# Patient Record
Sex: Male | Born: 1945 | Race: White | Hispanic: No | Marital: Married | Smoking: Never smoker
Health system: Southern US, Community
[De-identification: ages and names within clinical notes are randomized; demographics above are authoritative.]

## PROBLEM LIST (undated history)

## (undated) DIAGNOSIS — E119 Type 2 diabetes mellitus without complications: Secondary | ICD-10-CM

## (undated) DIAGNOSIS — Z434 Encounter for attention to other artificial openings of digestive tract: Secondary | ICD-10-CM

## (undated) DIAGNOSIS — E669 Obesity, unspecified: Secondary | ICD-10-CM

## (undated) DIAGNOSIS — I10 Essential (primary) hypertension: Secondary | ICD-10-CM

## (undated) DIAGNOSIS — I219 Acute myocardial infarction, unspecified: Secondary | ICD-10-CM

## (undated) DIAGNOSIS — E785 Hyperlipidemia, unspecified: Secondary | ICD-10-CM

## (undated) DIAGNOSIS — I4891 Unspecified atrial fibrillation: Secondary | ICD-10-CM

## (undated) DIAGNOSIS — I2510 Atherosclerotic heart disease of native coronary artery without angina pectoris: Secondary | ICD-10-CM

## (undated) DIAGNOSIS — T07XXXA Unspecified multiple injuries, initial encounter: Secondary | ICD-10-CM

## (undated) HISTORY — DX: Hyperlipidemia, unspecified: E78.5

## (undated) HISTORY — PX: REVISION TOTAL HIP ARTHROPLASTY: SHX766

## (undated) HISTORY — PX: JOINT REPLACEMENT: SHX530

## (undated) HISTORY — PX: OTHER SURGICAL HISTORY: SHX169

## (undated) HISTORY — PX: CARDIAC CATHETERIZATION: SHX172

## (undated) HISTORY — PX: ANKLE SURGERY: SHX546

---

## 2003-07-21 ENCOUNTER — Inpatient Hospital Stay (HOSPITAL_COMMUNITY): Admission: RE | Admit: 2003-07-21 | Discharge: 2003-07-24 | Payer: Self-pay | Admitting: Orthopedic Surgery

## 2004-02-17 ENCOUNTER — Ambulatory Visit (HOSPITAL_BASED_OUTPATIENT_CLINIC_OR_DEPARTMENT_OTHER): Admission: RE | Admit: 2004-02-17 | Discharge: 2004-02-17 | Payer: Self-pay | Admitting: Orthopedic Surgery

## 2004-02-17 ENCOUNTER — Ambulatory Visit (HOSPITAL_COMMUNITY): Admission: RE | Admit: 2004-02-17 | Discharge: 2004-02-17 | Payer: Self-pay | Admitting: Orthopedic Surgery

## 2005-08-22 ENCOUNTER — Inpatient Hospital Stay (HOSPITAL_COMMUNITY): Admission: AC | Admit: 2005-08-22 | Discharge: 2005-08-24 | Payer: Self-pay

## 2005-11-19 ENCOUNTER — Ambulatory Visit (HOSPITAL_BASED_OUTPATIENT_CLINIC_OR_DEPARTMENT_OTHER): Admission: RE | Admit: 2005-11-19 | Discharge: 2005-11-19 | Payer: Self-pay | Admitting: Orthopedic Surgery

## 2014-10-01 ENCOUNTER — Emergency Department (HOSPITAL_BASED_OUTPATIENT_CLINIC_OR_DEPARTMENT_OTHER): Payer: Medicare Other

## 2014-10-01 ENCOUNTER — Encounter (HOSPITAL_BASED_OUTPATIENT_CLINIC_OR_DEPARTMENT_OTHER): Payer: Self-pay | Admitting: Emergency Medicine

## 2014-10-01 ENCOUNTER — Emergency Department (HOSPITAL_BASED_OUTPATIENT_CLINIC_OR_DEPARTMENT_OTHER)
Admission: EM | Admit: 2014-10-01 | Discharge: 2014-10-01 | Disposition: A | Discharge status: Home / Self Care | Payer: Medicare Other | Attending: Emergency Medicine | Admitting: Emergency Medicine

## 2014-10-01 DIAGNOSIS — Z794 Long term (current) use of insulin: Secondary | ICD-10-CM | POA: Diagnosis not present

## 2014-10-01 DIAGNOSIS — Z79899 Other long term (current) drug therapy: Secondary | ICD-10-CM | POA: Diagnosis not present

## 2014-10-01 DIAGNOSIS — Z7982 Long term (current) use of aspirin: Secondary | ICD-10-CM | POA: Diagnosis not present

## 2014-10-01 DIAGNOSIS — I10 Essential (primary) hypertension: Secondary | ICD-10-CM | POA: Diagnosis not present

## 2014-10-01 DIAGNOSIS — I4891 Unspecified atrial fibrillation: Secondary | ICD-10-CM

## 2014-10-01 DIAGNOSIS — I252 Old myocardial infarction: Secondary | ICD-10-CM | POA: Diagnosis not present

## 2014-10-01 DIAGNOSIS — K2970 Gastritis, unspecified, without bleeding: Secondary | ICD-10-CM | POA: Diagnosis not present

## 2014-10-01 DIAGNOSIS — E119 Type 2 diabetes mellitus without complications: Secondary | ICD-10-CM | POA: Insufficient documentation

## 2014-10-01 DIAGNOSIS — R1012 Left upper quadrant pain: Secondary | ICD-10-CM | POA: Diagnosis present

## 2014-10-01 DIAGNOSIS — R52 Pain, unspecified: Secondary | ICD-10-CM

## 2014-10-01 HISTORY — DX: Type 2 diabetes mellitus without complications: E11.9

## 2014-10-01 HISTORY — DX: Acute myocardial infarction, unspecified: I21.9

## 2014-10-01 HISTORY — DX: Unspecified atrial fibrillation: I48.91

## 2014-10-01 HISTORY — DX: Essential (primary) hypertension: I10

## 2014-10-01 LAB — CBC WITH DIFFERENTIAL/PLATELET
BASOS ABS: 0 10*3/uL (ref 0.0–0.1)
Basophils Relative: 0 % (ref 0–1)
Eosinophils Absolute: 0.1 10*3/uL (ref 0.0–0.7)
Eosinophils Relative: 0 % (ref 0–5)
HEMATOCRIT: 39.9 % (ref 39.0–52.0)
Hemoglobin: 13.3 g/dL (ref 13.0–17.0)
LYMPHS ABS: 1.6 10*3/uL (ref 0.7–4.0)
Lymphocytes Relative: 11 % — ABNORMAL LOW (ref 12–46)
MCH: 30.8 pg (ref 26.0–34.0)
MCHC: 33.3 g/dL (ref 30.0–36.0)
MCV: 92.4 fL (ref 78.0–100.0)
Monocytes Absolute: 1.1 10*3/uL — ABNORMAL HIGH (ref 0.1–1.0)
Monocytes Relative: 7 % (ref 3–12)
Neutro Abs: 12.4 10*3/uL — ABNORMAL HIGH (ref 1.7–7.7)
Neutrophils Relative %: 82 % — ABNORMAL HIGH (ref 43–77)
PLATELETS: 250 10*3/uL (ref 150–400)
RBC: 4.32 MIL/uL (ref 4.22–5.81)
RDW: 15.7 % — AB (ref 11.5–15.5)
WBC: 15.2 10*3/uL — AB (ref 4.0–10.5)

## 2014-10-01 LAB — I-STAT CG4 LACTIC ACID, ED
Lactic Acid, Venous: 2.12 mmol/L (ref 0.5–2.0)
Lactic Acid, Venous: 1.39 mmol/L (ref 0.5–2.0)

## 2014-10-01 LAB — COMPREHENSIVE METABOLIC PANEL
ALK PHOS: 87 U/L (ref 39–117)
ALT: 19 U/L (ref 0–53)
ANION GAP: 14 (ref 5–15)
AST: 28 U/L (ref 0–37)
Albumin: 4.5 g/dL (ref 3.5–5.2)
BUN: 24 mg/dL — AB (ref 6–23)
CALCIUM: 10.3 mg/dL (ref 8.4–10.5)
CHLORIDE: 99 mmol/L (ref 96–112)
CO2: 27 mmol/L (ref 19–32)
Creatinine, Ser: 1.42 mg/dL — ABNORMAL HIGH (ref 0.50–1.35)
GFR, EST AFRICAN AMERICAN: 57 mL/min — AB (ref >90)
GFR, EST NON AFRICAN AMERICAN: 49 mL/min — AB (ref >90)
Glucose, Bld: 240 mg/dL — ABNORMAL HIGH (ref 70–99)
Potassium: 3.8 mmol/L (ref 3.5–5.1)
Sodium: 140 mmol/L (ref 135–145)
TOTAL PROTEIN: 7.7 g/dL (ref 6.0–8.3)
Total Bilirubin: 2.3 mg/dL — ABNORMAL HIGH (ref 0.3–1.2)

## 2014-10-01 LAB — URINALYSIS, ROUTINE W REFLEX MICROSCOPIC
Bilirubin Urine: NEGATIVE
Glucose, UA: 250 mg/dL — AB
HGB URINE DIPSTICK: NEGATIVE
Ketones, ur: 15 mg/dL — AB
Leukocytes, UA: NEGATIVE
Nitrite: NEGATIVE
PROTEIN: NEGATIVE mg/dL
Specific Gravity, Urine: >1.046 — ABNORMAL HIGH (ref 1.005–1.030)
UROBILINOGEN UA: 0.2 mg/dL (ref 0.0–1.0)
pH: 5.5 (ref 5.0–8.0)

## 2014-10-01 LAB — TROPONIN I

## 2014-10-01 LAB — CBG MONITORING, ED: Glucose-Capillary: 195 mg/dL — ABNORMAL HIGH (ref 70–99)

## 2014-10-01 LAB — LIPASE, BLOOD: Lipase: 36 U/L (ref 11–59)

## 2014-10-01 MED ORDER — APIXABAN 5 MG PO TABS: 5.0000 mg | ORAL_TABLET | Freq: Two times a day (BID) | ORAL | Status: DC

## 2014-10-01 MED ORDER — KETOROLAC TROMETHAMINE 30 MG/ML IJ SOLN
INTRAMUSCULAR | Status: AC
  Filled 2014-10-01: qty 1

## 2014-10-01 MED ORDER — FAMOTIDINE IN NACL 20-0.9 MG/50ML-% IV SOLN
20.0000 mg | Freq: Once | INTRAVENOUS | Status: AC
  Administered 2014-10-01: 20 mg via INTRAVENOUS
  Filled 2014-10-01: qty 50

## 2014-10-01 MED ORDER — ONDANSETRON HCL 4 MG/2ML IJ SOLN
4.0000 mg | Freq: Once | INTRAMUSCULAR | Status: AC
  Administered 2014-10-01: 4 mg via INTRAVENOUS

## 2014-10-01 MED ORDER — IOHEXOL 350 MG/ML SOLN
100.0000 mL | Freq: Once | INTRAVENOUS | Status: AC | PRN
  Administered 2014-10-01: 100 mL via INTRAVENOUS

## 2014-10-01 MED ORDER — MORPHINE SULFATE 4 MG/ML IJ SOLN
4.0000 mg | Freq: Once | INTRAMUSCULAR | Status: AC
  Administered 2014-10-01: 4 mg via INTRAVENOUS
  Filled 2014-10-01: qty 1

## 2014-10-01 MED ORDER — FENTANYL CITRATE (PF) 100 MCG/2ML IJ SOLN
100.0000 ug | Freq: Once | INTRAMUSCULAR | Status: AC
  Administered 2014-10-01: 100 ug via INTRAVENOUS

## 2014-10-01 MED ORDER — SUCRALFATE 1 GM/10ML PO SUSP
1.0000 g | Freq: Three times a day (TID) | ORAL | Status: DC
Start: 2014-10-01 — End: 2014-12-16

## 2014-10-01 MED ORDER — SODIUM CHLORIDE 0.9 % IV BOLUS (SEPSIS)
500.0000 mL | Freq: Once | INTRAVENOUS | Status: AC
  Administered 2014-10-01: 500 mL via INTRAVENOUS

## 2014-10-01 MED ORDER — SODIUM CHLORIDE 0.9 % IV SOLN
1000.0000 mL | Freq: Once | INTRAVENOUS | Status: AC
  Administered 2014-10-01: 1000 mL via INTRAVENOUS

## 2014-10-01 MED ORDER — KETOROLAC TROMETHAMINE 30 MG/ML IJ SOLN
30.0000 mg | Freq: Once | INTRAMUSCULAR | Status: AC
  Administered 2014-10-01: 30 mg via INTRAVENOUS

## 2014-10-01 MED ORDER — SODIUM CHLORIDE 0.9 % IV BOLUS (SEPSIS)
1000.0000 mL | Freq: Once | INTRAVENOUS | Status: AC
  Administered 2014-10-01: 1000 mL via INTRAVENOUS

## 2014-10-01 MED ORDER — GI COCKTAIL ~~LOC~~
30.0000 mL | Freq: Once | ORAL | Status: AC
  Administered 2014-10-01: 30 mL via ORAL
  Filled 2014-10-01: qty 30

## 2014-10-01 NOTE — ED Notes (Signed)
Left upper quad pain x 3 hours. Patient is pale and diaphoretic with n/v

## 2014-10-01 NOTE — ED Provider Notes (Signed)
CSN: [id number]     Arrival date & time 10/01/14  0103 History   First MD Initiated Contact with Patient 10/01/14 0123     Chief Complaint  Patient presents with  . Abdominal Pain     (Consider location/radiation/quality/duration/timing/severity/associated sxs/prior Treatment) Patient is a 69 y.o. male presenting with abdominal pain. The history is provided by the patient.  Abdominal Pain Pain location:  LUQ Pain quality: sharp   Pain radiates to:  Does not radiate Pain severity:  Severe Onset quality:  Gradual Duration:  12 hours Timing:  Constant Progression:  Worsening Chronicity:  New Context: not eating and not trauma   Context comment:  New medications Relieved by:  Nothing Worsened by:  Nothing tried Ineffective treatments:  None tried Associated symptoms: nausea and vomiting   Associated symptoms: no chest pain, no constipation, no diarrhea, no fever and no shortness of breath   Risk factors: no alcohol abuse     Past Medical History  Diagnosis Date  . Diabetes mellitus without complication   . MI (myocardial infarction)   . Hypertension    Past Surgical History  Procedure Laterality Date  . Revision total hip arthroplasty    . Knee replacement     History reviewed. No pertinent family history. History  Substance Use Topics  . Smoking status: Never Smoker   . Smokeless tobacco: Not on file  . Alcohol Use: No    Review of Systems  Constitutional: Negative for fever.  Respiratory: Negative for shortness of breath.   Cardiovascular: Negative for chest pain, palpitations and leg swelling.  Gastrointestinal: Positive for nausea, vomiting and abdominal pain. Negative for diarrhea and constipation.  All other systems reviewed and are negative.     Allergies  Review of patient's allergies indicates no known allergies.  Home Medications   Prior to Admission medications   Medication Sig Start Date End Date Taking? Authorizing Provider  allopurinol  (ZYLOPRIM) 300 MG tablet Take 300 mg by mouth daily.   Yes Historical Provider, MD  amLODipine (NORVASC) 10 MG tablet Take 10 mg by mouth daily.   Yes Historical Provider, MD  aspirin 81 MG chewable tablet Chew 81 mg by mouth daily.   Yes Historical Provider, MD  atorvastatin (LIPITOR) 80 MG tablet Take 80 mg by mouth daily.   Yes Historical Provider, MD  furosemide (LASIX) 40 MG tablet Take 40 mg by mouth.   Yes Historical Provider, MD  insulin glargine (LANTUS) 100 UNIT/ML injection Inject 40 Units into the skin at bedtime.   Yes Historical Provider, MD  lisinopril (PRINIVIL,ZESTRIL) 40 MG tablet Take 40 mg by mouth daily.   Yes Historical Provider, MD  metFORMIN (GLUCOPHAGE) 500 MG tablet Take 500 mg by mouth 2 (two) times daily with a meal.   Yes Historical Provider, MD  pantoprazole (PROTONIX) 40 MG tablet Take 40 mg by mouth daily.   Yes Historical Provider, MD   BP 107/46 mmHg  Pulse 80  Temp(Src) 98 F (36.7 C) (Oral)  Resp 22  Ht  (1.702 m)  Wt 266 lb (120.657 kg)  BMI 41.65 kg/m2  SpO2 100% Physical Exam  Constitutional: He is oriented to person, place, and time. He appears well-developed and well-nourished. No distress.  HENT:  Head: Normocephalic and atraumatic.  Mouth/Throat: Oropharynx is clear and moist.  Eyes: Conjunctivae and EOM are normal. Pupils are equal, round, and reactive to light.  Neck: Normal range of motion. Neck supple.  Cardiovascular: Normal rate.  An irregularly irregular rhythm  present.  Pulmonary/Chest: Effort normal and breath sounds normal. No respiratory distress. He has no wheezes. He has no rales.  Abdominal: Soft. Bowel sounds are normal. He exhibits no mass. There is no tenderness. There is no rebound and no guarding.  Musculoskeletal: Normal range of motion.  Neurological: He is alert and oriented to person, place, and time.  Skin: Skin is warm and dry. He is not diaphoretic.  Psychiatric: He has a normal mood and affect.    ED Course   Procedures (including critical care time) Labs Review Labs Reviewed  CBC WITH DIFFERENTIAL/PLATELET - Abnormal; Notable for the following:    WBC 15.2 (*)    RDW 15.7 (*)    Neutrophils Relative % 82 (*)    Neutro Abs 12.4 (*)    Lymphocytes Relative 11 (*)    Monocytes Absolute 1.1 (*)    All other components within normal limits  COMPREHENSIVE METABOLIC PANEL - Abnormal; Notable for the following:    Glucose, Bld 240 (*)    BUN 24 (*)    Creatinine, Ser 1.42 (*)    Total Bilirubin 2.3 (*)    GFR calc non Af Amer 49 (*)    GFR calc Af Amer 57 (*)    All other components within normal limits  URINALYSIS, ROUTINE W REFLEX MICROSCOPIC - Abnormal; Notable for the following:    Specific Gravity, Urine >1.046 (*)    Glucose, UA 250 (*)    Ketones, ur 15 (*)    All other components within normal limits  LIPASE, BLOOD  TROPONIN I  I-STAT CG4 LACTIC ACID, ED  I-STAT CHEM 8, ED  I-STAT CG4 LACTIC ACID, ED    Imaging Review Ct Angio Chest Pe W/cm &/or Wo Cm  10/01/2014   CLINICAL DATA:  Left upper quadrant pain for 3 hours with paleness and diaphoresis.  EXAM: CT ANGIOGRAPHY CHEST, ABDOMEN AND PELVIS  TECHNIQUE: Multidetector CT imaging through the chest, abdomen and pelvis was performed using the standard protocol during bolus administration of intravenous contrast. Multiplanar reconstructed images and MIPs were obtained and reviewed to evaluate the vascular anatomy.  CONTRAST:  OMNIPAQUE IOHEXOL 350 MG/ML SOLN  COMPARISON:  None.  FINDINGS: CTA CHEST FINDINGS  THORACIC INLET/BODY WALL:  No acute abnormality.  MEDIASTINUM:  Normal heart size. No pericardial effusion. There is coronary atherosclerosis which is diffuse throughout the left and right circulation. No aortic aneurysm, dissection, or intramural hematoma. Small sliding hiatal hernia. No evidence of pulmonary embolism.  LUNG WINDOWS:  No consolidation.  No effusion.  No suspicious pulmonary nodule.  OSSEOUS:  See below  Review  of the MIP images confirms the above findings.  CTA ABDOMEN AND PELVIS FINDINGS  BODY WALL: No contributory findings.  Liver: Hepatic steatosis.  No focal abnormality.  Biliary: Cholelithiasis. No evidence of biliary obstruction or inflammation.  Pancreas: Unremarkable.  Spleen: Unremarkable.  Adrenals: Unremarkable.  Kidneys and ureters: 2 punctate calculi in the lower pole left kidney, nonobstructive. No ureteral calculus or hydronephrosis  Bladder: Partly obscured by streak artifact from of the right hip prosthesis. No pathologic findings  Reproductive: Negative for age.  Bowel: Distal colonic diverticulosis. No bowel inflammation or obstruction. Negative appendix.  Retroperitoneum: No mass or adenopathy.  Peritoneum: No ascites or pneumoperitoneum.  Vascular: Standard aortic branching. No aneurysm, dissection, or wall thickening. There is scattered atherosclerotic calcification of the aorta and branch vessels without notable stenosis.  OSSEOUS: Right hip arthroplasty. There is near bridging bulky heterotopic ossification about the joint.  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 This is presumably posttraumatic given unilaterality.  Mid thoracic vertebral body compression deformities appear chronic. There is advanced and diffuse spondylosis. 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  Review of the MIP images confirms the above findings.  IMPRESSION: 1. No acute findings, including aortic dissection. 2. Extensive coronary atherosclerosis. 3. Hepatic steatosis. 4. Cholelithiasis. 5. Additional chronic findings are noted above.   Electronically Signed   By: Marnee Spring M.D.   On: 10/01/2014 03:54   Dg Chest Portable 1 View  10/01/2014   CLINICAL DATA:  Abdominal pain, left upper quadrant  EXAM: PORTABLE CHEST - 1 VIEW  COMPARISON:  None currently available  FINDINGS: Normal heart size and mediastinal contours for technique. No acute infiltrate or edema. No effusion or  pneumothorax. No acute osseous findings.  IMPRESSION: No active disease.   Electronically Signed   By: Marnee Spring M.D.   On: 10/01/2014 03:16   Ct Angio Abd/pel W/ And/or W/o  10/01/2014   CLINICAL DATA:  Left upper quadrant pain for 3 hours with paleness and diaphoresis.  EXAM: CT ANGIOGRAPHY CHEST, ABDOMEN AND PELVIS  TECHNIQUE: Multidetector CT imaging through the chest, abdomen and pelvis was performed using the standard protocol during bolus administration of intravenous contrast. Multiplanar reconstructed images and MIPs were obtained and reviewed to evaluate the vascular anatomy.  CONTRAST:  OMNIPAQUE IOHEXOL 350 MG/ML SOLN  COMPARISON:  None.  FINDINGS: CTA CHEST FINDINGS  THORACIC INLET/BODY WALL:  No acute abnormality.  MEDIASTINUM:  Normal heart size. No pericardial effusion. There is coronary atherosclerosis which is diffuse throughout the left and right circulation. No aortic aneurysm, dissection, or intramural hematoma. Small sliding hiatal hernia. No evidence of pulmonary embolism.  LUNG WINDOWS:  No consolidation.  No effusion.  No suspicious pulmonary nodule.  OSSEOUS:  See below  Review of the MIP images confirms the above findings.  CTA ABDOMEN AND PELVIS FINDINGS  BODY WALL: No contributory findings.  Liver: Hepatic steatosis.  No focal abnormality.  Biliary: Cholelithiasis. No evidence of biliary obstruction or inflammation.  Pancreas: Unremarkable.  Spleen: Unremarkable.  Adrenals: Unremarkable.  Kidneys and ureters: 2 punctate calculi in the lower pole left kidney, nonobstructive. No ureteral calculus or hydronephrosis  Bladder: Partly obscured by streak artifact from of the right hip prosthesis. No pathologic findings  Reproductive: Negative for age.  Bowel: Distal colonic diverticulosis. No bowel inflammation or obstruction. Negative appendix.  Retroperitoneum: No mass or adenopathy.  Peritoneum: No ascites or pneumoperitoneum.  Vascular: Standard aortic branching. No aneurysm,  dissection, or wall thickening. There is scattered atherosclerotic calcification of the aorta and branch vessels without notable stenosis.  OSSEOUS: Right hip arthroplasty. There is near bridging bulky heterotopic ossification about the joint.  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 This is presumably posttraumatic given unilaterality.  Mid thoracic vertebral body compression deformities appear chronic. There is advanced and diffuse spondylosis. 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  Review of the MIP images confirms the above findings.  IMPRESSION: 1. No acute findings, including aortic dissection. 2. Extensive coronary atherosclerosis. 3. Hepatic steatosis. 4. Cholelithiasis. 5. Additional chronic findings are noted above.   Electronically Signed   By: Marnee Spring M.D.   On: 10/01/2014 03:54     EKG Interpretation   Date/Time:  Friday Kru Allman 15 2016 01:12:51 EDT Ventricular Rate:  83 PR Interval:    QRS Duration: 106 QT Interval:  390 QTC Calculation: 458 R Axis:   -  22 Text Interpretation:  Atrial fibrillation Minimal voltage criteria for  LVH, may be normal variant Confirmed by [facility]  MD, Teyton Pattillo ([id number])  on 10/01/2014 2:15:16 AM      MDM   Final diagnoses:  Pain  Gastritis  Atrial fibrillation with controlled ventricular response   Results for orders placed or performed during the hospital encounter of 10/01/14  CBC with Differential  Result Value Ref Range   WBC 15.2 (H) 4.0 - 10.5 K/uL   RBC 4.32 4.22 - 5.81 MIL/uL   Hemoglobin 13.3 13.0 - 17.0 g/dL   HCT 03.4 74.2 - 59.5 %   MCV 92.4 78.0 - 100.0 fL   MCH 30.8 26.0 - 34.0 pg   MCHC 33.3 30.0 - 36.0 g/dL   RDW 63.8 (H) 75.6 - 43.3 %   Platelets 250 150 - 400 K/uL   Neutrophils Relative % 82 (H) 43 - 77 %   Neutro Abs 12.4 (H) 1.7 - 7.7 K/uL   Lymphocytes Relative 11 (L) 12 - 46 %   Lymphs Abs 1.6 0.7 - 4.0 K/uL   Monocytes Relative 7 3 - 12 %   Monocytes Absolute  1.1 (H) 0.1 - 1.0 K/uL   Eosinophils Relative 0 0 - 5 %   Eosinophils Absolute 0.1 0.0 - 0.7 K/uL   Basophils Relative 0 0 - 1 %   Basophils Absolute 0.0 0.0 - 0.1 K/uL  Comprehensive metabolic panel  Result Value Ref Range   Sodium 140 135 - 145 mmol/L   Potassium 3.8 3.5 - 5.1 mmol/L   Chloride 99 96 - 112 mmol/L   CO2 27 19 - 32 mmol/L   Glucose, Bld 240 (H) 70 - 99 mg/dL   BUN 24 (H) 6 - 23 mg/dL   Creatinine, Ser 2.95 (H) 0.50 - 1.35 mg/dL   Calcium 18.8 8.4 - 41.6 mg/dL   Total Protein 7.7 6.0 - 8.3 g/dL   Albumin 4.5 3.5 - 5.2 g/dL   AST 28 0 - 37 U/L   ALT 19 0 - 53 U/L   Alkaline Phosphatase 87 39 - 117 U/L   Total Bilirubin 2.3 (H) 0.3 - 1.2 mg/dL   GFR calc non Af Amer 49 (L) >90 mL/min   GFR calc Af Amer 57 (L) >90 mL/min   Anion gap 14 5 - 15  Lipase, blood  Result Value Ref Range   Lipase 36 11 - 59 U/L  Troponin I  (only if pt is 68 y.o. or older and pain is above umbilicus)  Result Value Ref Range   Troponin I <0.03 <0.031 ng/mL  Urinalysis, Routine w reflex microscopic  Result Value Ref Range   Color, Urine YELLOW YELLOW   APPearance CLEAR CLEAR   Specific Gravity, Urine >1.046 (H) 1.005 - 1.030   pH 5.5 5.0 - 8.0   Glucose, UA 250 (A) NEGATIVE mg/dL   Hgb urine dipstick NEGATIVE NEGATIVE   Bilirubin Urine NEGATIVE NEGATIVE   Ketones, ur 15 (A) NEGATIVE mg/dL   Protein, ur NEGATIVE NEGATIVE mg/dL   Urobilinogen, UA 0.2 0.0 - 1.0 mg/dL   Nitrite NEGATIVE NEGATIVE   Leukocytes, UA NEGATIVE NEGATIVE  I-Stat CG4 Lactic Acid, ED  Result Value Ref Range   Lactic Acid, Venous 1.39 0.5 - 2.0 mmol/L   Ct Angio Chest Pe W/cm &/or Wo Cm  10/01/2014   CLINICAL DATA:  Left upper quadrant pain for 3 hours with paleness and diaphoresis.  EXAM: CT ANGIOGRAPHY CHEST, ABDOMEN AND PELVIS  TECHNIQUE:  Multidetector CT imaging through the chest, abdomen and pelvis was performed using the standard protocol during bolus administration of intravenous contrast. Multiplanar  reconstructed images and MIPs were obtained and reviewed to evaluate the vascular anatomy.  CONTRAST:  OMNIPAQUE IOHEXOL 350 MG/ML SOLN  COMPARISON:  None.  FINDINGS: CTA CHEST FINDINGS  THORACIC INLET/BODY WALL:  No acute abnormality.  MEDIASTINUM:  Normal heart size. No pericardial effusion. There is coronary atherosclerosis which is diffuse throughout the left and right circulation. No aortic aneurysm, dissection, or intramural hematoma. Small sliding hiatal hernia. No evidence of pulmonary embolism.  LUNG WINDOWS:  No consolidation.  No effusion.  No suspicious pulmonary nodule.  OSSEOUS:  See below  Review of the MIP images confirms the above findings.  CTA ABDOMEN AND PELVIS FINDINGS  BODY WALL: No contributory findings.  Liver: Hepatic steatosis.  No focal abnormality.  Biliary: Cholelithiasis. No evidence of biliary obstruction or inflammation.  Pancreas: Unremarkable.  Spleen: Unremarkable.  Adrenals: Unremarkable.  Kidneys and ureters: 2 punctate calculi in the lower pole left kidney, nonobstructive. No ureteral calculus or hydronephrosis  Bladder: Partly obscured by streak artifact from of the right hip prosthesis. No pathologic findings  Reproductive: Negative for age.  Bowel: Distal colonic diverticulosis. No bowel inflammation or obstruction. Negative appendix.  Retroperitoneum: No mass or adenopathy.  Peritoneum: No ascites or pneumoperitoneum.  Vascular: Standard aortic branching. No aneurysm, dissection, or wall thickening. There is scattered atherosclerotic calcification of the aorta and branch vessels without notable stenosis.  OSSEOUS: Right hip arthroplasty. There is near bridging bulky heterotopic ossification about the joint.  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 This is presumably posttraumatic given unilaterality.  Mid thoracic vertebral body compression deformities appear chronic. There is advanced and diffuse spondylosis. Advanced lumbar facet osteoarthritis with L4-5 slip  and foraminal stenosis.  Review of the MIP images confirms the above findings.  IMPRESSION: 1. No acute findings, including aortic dissection. 2. Extensive coronary atherosclerosis. 3. Hepatic steatosis. 4. Cholelithiasis. 5. Additional chronic findings are noted above.   Electronically Signed   By: Marnee Spring M.D.   On: 10/01/2014 03:54   Dg Chest Portable 1 View  10/01/2014   CLINICAL DATA:  Abdominal pain, left upper quadrant  EXAM: PORTABLE CHEST - 1 VIEW  COMPARISON:  None currently available  FINDINGS: Normal heart size and mediastinal contours for technique. No acute infiltrate or edema. No effusion or pneumothorax. No acute osseous findings.  IMPRESSION: No active disease.   Electronically Signed   By: Marnee Spring M.D.   On: 10/01/2014 03:16   Ct Angio Abd/pel W/ And/or W/o  10/01/2014   CLINICAL DATA:  Left upper quadrant pain for 3 hours with paleness and diaphoresis.  EXAM: CT ANGIOGRAPHY CHEST, ABDOMEN AND PELVIS  TECHNIQUE: Multidetector CT imaging through the chest, abdomen and pelvis was performed using the standard protocol during bolus administration of intravenous contrast. Multiplanar reconstructed images and MIPs were obtained and reviewed to evaluate the vascular anatomy.  CONTRAST:  OMNIPAQUE IOHEXOL 350 MG/ML SOLN  COMPARISON:  None.  FINDINGS: CTA CHEST FINDINGS  THORACIC INLET/BODY WALL:  No acute abnormality.  MEDIASTINUM:  Normal heart size. No pericardial effusion. There is coronary atherosclerosis which is diffuse throughout the left and right circulation. No aortic aneurysm, dissection, or intramural hematoma. Small sliding hiatal hernia. No evidence of pulmonary embolism.  LUNG WINDOWS:  No consolidation.  No effusion.  No suspicious pulmonary nodule.  OSSEOUS:  See below  Review of the MIP images confirms the  above findings.  CTA ABDOMEN AND PELVIS FINDINGS  BODY WALL: No contributory findings.  Liver: Hepatic steatosis.  No focal abnormality.  Biliary:  Cholelithiasis. No evidence of biliary obstruction or inflammation.  Pancreas: Unremarkable.  Spleen: Unremarkable.  Adrenals: Unremarkable.  Kidneys and ureters: 2 punctate calculi in the lower pole left kidney, nonobstructive. No ureteral calculus or hydronephrosis  Bladder: Partly obscured by streak artifact from of the right hip prosthesis. No pathologic findings  Reproductive: Negative for age.  Bowel: Distal colonic diverticulosis. No bowel inflammation or obstruction. Negative appendix.  Retroperitoneum: No mass or adenopathy.  Peritoneum: No ascites or pneumoperitoneum.  Vascular: Standard aortic branching. No aneurysm, dissection, or wall thickening. There is scattered atherosclerotic calcification of the aorta and branch vessels without notable stenosis.  OSSEOUS: Right hip arthroplasty. There is near bridging bulky heterotopic ossification about the joint.  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 This is presumably posttraumatic given unilaterality.  Mid thoracic vertebral body compression deformities appear chronic. There is advanced and diffuse spondylosis. 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  Review of the MIP images confirms the above findings.  IMPRESSION: 1. No acute findings, including aortic dissection. 2. Extensive coronary atherosclerosis. 3. Hepatic steatosis. 4. Cholelithiasis. 5. Additional chronic findings are noted above.   Electronically Signed   By: Marnee SpringJonathon  Watts M.D.   On: 10/01/2014 03:54    Medications  0.9 %  sodium chloride infusion (0 mLs Intravenous Stopped 10/01/14 0244)  ondansetron (ZOFRAN) injection 4 mg (4 mg Intravenous Given 10/01/14 0122)  fentaNYL (SUBLIMAZE) injection 100 mcg (100 mcg Intravenous Given 10/01/14 0138)  sodium chloride 0.9 % bolus 500 mL (0 mLs Intravenous Stopped 10/01/14 0433)  iohexol (OMNIPAQUE) 350 MG/ML injection 100 mL (100 mLs Intravenous Contrast Given 10/01/14 0242)  morphine 4 MG/ML injection  4 mg (4 mg Intravenous Given 10/01/14 0257)  sodium chloride 0.9 % bolus 1,000 mL (0 mLs Intravenous Stopped 10/01/14 0546)  famotidine (PEPCID) IVPB 20 mg (0 mg Intravenous Stopped 10/01/14 0547)  ketorolac (TORADOL) 30 MG/ML injection 30 mg (30 mg Intravenous Given 10/01/14 0431)  gi cocktail (Maalox,Lidocaine,Donnatal) (30 mLs Oral Given 10/01/14 0431)    Likely gastritis from 3 new medications started by patient's PMD  Based on ongoing symptoms with > 8 hours duration with negative EKG for ischemia and negative troponin ACS is excluded.  Pain resolved with GI cocktail.  CT negative for acute pathology.  Given Afib discussed case via phone with Dr. Shirlee LatchMcLean of cardiology with start eliquis BID 5 mg for anticoagulation and have patient follow up with his cardiologist.  Call your doctor about the new medications that you are stopping and regarding starting Eliquis.  Contact your cardiologist in am.  Strict return precautions given    Khylen Riolo, MD 10/01/14 [phone]

## 2014-10-05 ENCOUNTER — Encounter (HOSPITAL_BASED_OUTPATIENT_CLINIC_OR_DEPARTMENT_OTHER): Payer: Self-pay

## 2014-10-05 ENCOUNTER — Inpatient Hospital Stay (HOSPITAL_BASED_OUTPATIENT_CLINIC_OR_DEPARTMENT_OTHER)
Admission: EM | Admit: 2014-10-05 | Discharge: 2014-10-11 | DRG: 853 | Disposition: A | Discharge status: Home / Self Care | Payer: Medicare Other | Attending: Internal Medicine | Admitting: Internal Medicine

## 2014-10-05 ENCOUNTER — Emergency Department (HOSPITAL_BASED_OUTPATIENT_CLINIC_OR_DEPARTMENT_OTHER): Payer: Medicare Other

## 2014-10-05 DIAGNOSIS — R6521 Severe sepsis with septic shock: Secondary | ICD-10-CM | POA: Diagnosis present

## 2014-10-05 DIAGNOSIS — I480 Paroxysmal atrial fibrillation: Secondary | ICD-10-CM | POA: Insufficient documentation

## 2014-10-05 DIAGNOSIS — E860 Dehydration: Secondary | ICD-10-CM | POA: Diagnosis present

## 2014-10-05 DIAGNOSIS — I252 Old myocardial infarction: Secondary | ICD-10-CM

## 2014-10-05 DIAGNOSIS — I248 Other forms of acute ischemic heart disease: Secondary | ICD-10-CM | POA: Diagnosis present

## 2014-10-05 DIAGNOSIS — Z7982 Long term (current) use of aspirin: Secondary | ICD-10-CM | POA: Diagnosis not present

## 2014-10-05 DIAGNOSIS — Z6841 Body Mass Index (BMI) 40.0 and over, adult: Secondary | ICD-10-CM | POA: Diagnosis not present

## 2014-10-05 DIAGNOSIS — I9581 Postprocedural hypotension: Secondary | ICD-10-CM | POA: Diagnosis not present

## 2014-10-05 DIAGNOSIS — N141 Nephropathy induced by other drugs, medicaments and biological substances: Secondary | ICD-10-CM | POA: Diagnosis not present

## 2014-10-05 DIAGNOSIS — I2510 Atherosclerotic heart disease of native coronary artery without angina pectoris: Secondary | ICD-10-CM | POA: Diagnosis present

## 2014-10-05 DIAGNOSIS — K8000 Calculus of gallbladder with acute cholecystitis without obstruction: Secondary | ICD-10-CM | POA: Diagnosis present

## 2014-10-05 DIAGNOSIS — E1165 Type 2 diabetes mellitus with hyperglycemia: Secondary | ICD-10-CM | POA: Diagnosis present

## 2014-10-05 DIAGNOSIS — K819 Cholecystitis, unspecified: Secondary | ICD-10-CM | POA: Diagnosis not present

## 2014-10-05 DIAGNOSIS — Z955 Presence of coronary angioplasty implant and graft: Secondary | ICD-10-CM | POA: Diagnosis not present

## 2014-10-05 DIAGNOSIS — I493 Ventricular premature depolarization: Secondary | ICD-10-CM | POA: Diagnosis present

## 2014-10-05 DIAGNOSIS — N179 Acute kidney failure, unspecified: Secondary | ICD-10-CM | POA: Diagnosis not present

## 2014-10-05 DIAGNOSIS — Z96659 Presence of unspecified artificial knee joint: Secondary | ICD-10-CM | POA: Diagnosis present

## 2014-10-05 DIAGNOSIS — G9341 Metabolic encephalopathy: Secondary | ICD-10-CM | POA: Diagnosis not present

## 2014-10-05 DIAGNOSIS — J9601 Acute respiratory failure with hypoxia: Secondary | ICD-10-CM | POA: Diagnosis not present

## 2014-10-05 DIAGNOSIS — Z95828 Presence of other vascular implants and grafts: Secondary | ICD-10-CM

## 2014-10-05 DIAGNOSIS — E119 Type 2 diabetes mellitus without complications: Secondary | ICD-10-CM | POA: Diagnosis not present

## 2014-10-05 DIAGNOSIS — D649 Anemia, unspecified: Secondary | ICD-10-CM | POA: Diagnosis present

## 2014-10-05 DIAGNOSIS — R0600 Dyspnea, unspecified: Secondary | ICD-10-CM

## 2014-10-05 DIAGNOSIS — I10 Essential (primary) hypertension: Secondary | ICD-10-CM | POA: Diagnosis present

## 2014-10-05 DIAGNOSIS — A419 Sepsis, unspecified organism: Principal | ICD-10-CM | POA: Diagnosis present

## 2014-10-05 DIAGNOSIS — I959 Hypotension, unspecified: Secondary | ICD-10-CM

## 2014-10-05 DIAGNOSIS — Z79899 Other long term (current) drug therapy: Secondary | ICD-10-CM | POA: Diagnosis not present

## 2014-10-05 DIAGNOSIS — R1011 Right upper quadrant pain: Secondary | ICD-10-CM | POA: Diagnosis present

## 2014-10-05 DIAGNOSIS — E785 Hyperlipidemia, unspecified: Secondary | ICD-10-CM | POA: Diagnosis present

## 2014-10-05 DIAGNOSIS — T508X5A Adverse effect of diagnostic agents, initial encounter: Secondary | ICD-10-CM | POA: Diagnosis present

## 2014-10-05 DIAGNOSIS — K810 Acute cholecystitis: Secondary | ICD-10-CM | POA: Diagnosis not present

## 2014-10-05 HISTORY — DX: Atherosclerotic heart disease of native coronary artery without angina pectoris: I25.10

## 2014-10-05 HISTORY — DX: Unspecified atrial fibrillation: I48.91

## 2014-10-05 HISTORY — DX: Obesity, unspecified: E66.9

## 2014-10-05 LAB — URINALYSIS, ROUTINE W REFLEX MICROSCOPIC
Bilirubin Urine: NEGATIVE
Glucose, UA: NEGATIVE mg/dL
Hgb urine dipstick: NEGATIVE
KETONES UR: NEGATIVE mg/dL
Leukocytes, UA: NEGATIVE
NITRITE: NEGATIVE
PH: 5 (ref 5.0–8.0)
Protein, ur: NEGATIVE mg/dL
Specific Gravity, Urine: 1.013 (ref 1.005–1.030)
Urobilinogen, UA: 0.2 mg/dL (ref 0.0–1.0)

## 2014-10-05 LAB — CBC WITH DIFFERENTIAL/PLATELET
Basophils Absolute: 0 10*3/uL (ref 0.0–0.1)
Basophils Relative: 0 % (ref 0–1)
EOS PCT: 0 % (ref 0–5)
Eosinophils Absolute: 0 10*3/uL (ref 0.0–0.7)
HCT: 30.3 % — ABNORMAL LOW (ref 39.0–52.0)
HEMOGLOBIN: 10.6 g/dL — AB (ref 13.0–17.0)
LYMPHS ABS: 0.5 10*3/uL — AB (ref 0.7–4.0)
Lymphocytes Relative: 4 % — ABNORMAL LOW (ref 12–46)
MCH: 31.2 pg (ref 26.0–34.0)
MCHC: 35 g/dL (ref 30.0–36.0)
MCV: 89.1 fL (ref 78.0–100.0)
MONOS PCT: 13 % — AB (ref 3–12)
Monocytes Absolute: 1.5 10*3/uL — ABNORMAL HIGH (ref 0.1–1.0)
Neutro Abs: 9.7 10*3/uL — ABNORMAL HIGH (ref 1.7–7.7)
Neutrophils Relative %: 83 % — ABNORMAL HIGH (ref 43–77)
Platelets: 174 10*3/uL (ref 150–400)
RBC: 3.4 MIL/uL — AB (ref 4.22–5.81)
RDW: 15.4 % (ref 11.5–15.5)
WBC: 11.7 10*3/uL — ABNORMAL HIGH (ref 4.0–10.5)

## 2014-10-05 LAB — I-STAT CHEM 8, ED
BUN: 20 mg/dL (ref 6–23)
CHLORIDE: 105 mmol/L (ref 96–112)
Calcium, Ion: 1.09 mmol/L — ABNORMAL LOW (ref 1.13–1.30)
Creatinine, Ser: 1.2 mg/dL (ref 0.50–1.35)
GLUCOSE: 221 mg/dL — AB (ref 70–99)
POTASSIUM: 3.3 mmol/L — AB (ref 3.5–5.1)
Sodium: 142 mmol/L (ref 135–145)
TCO2: 20 mmol/L (ref 0–100)

## 2014-10-05 LAB — COMPREHENSIVE METABOLIC PANEL
ALT: 25 U/L (ref 0–53)
ANION GAP: 10 (ref 5–15)
AST: 34 U/L (ref 0–37)
Albumin: 3 g/dL — ABNORMAL LOW (ref 3.5–5.2)
Alkaline Phosphatase: 76 U/L (ref 39–117)
BUN: 57 mg/dL — AB (ref 6–23)
CO2: 25 mmol/L (ref 19–32)
Calcium: 7.9 mg/dL — ABNORMAL LOW (ref 8.4–10.5)
Chloride: 98 mmol/L (ref 96–112)
Creatinine, Ser: 2.33 mg/dL — ABNORMAL HIGH (ref 0.50–1.35)
GFR calc Af Amer: 31 mL/min — ABNORMAL LOW (ref >90)
GFR calc non Af Amer: 27 mL/min — ABNORMAL LOW (ref >90)
Glucose, Bld: 198 mg/dL — ABNORMAL HIGH (ref 70–99)
POTASSIUM: 3.8 mmol/L (ref 3.5–5.1)
Sodium: 133 mmol/L — ABNORMAL LOW (ref 135–145)
TOTAL PROTEIN: 6.6 g/dL (ref 6.0–8.3)
Total Bilirubin: 2.8 mg/dL — ABNORMAL HIGH (ref 0.3–1.2)

## 2014-10-05 LAB — I-STAT CG4 LACTIC ACID, ED: Lactic Acid, Venous: 1.63 mmol/L (ref 0.5–2.0)

## 2014-10-05 LAB — LIPASE, BLOOD: LIPASE: 25 U/L (ref 11–59)

## 2014-10-05 MED ORDER — SODIUM CHLORIDE 0.9 % IJ SOLN
3.0000 mL | Freq: Two times a day (BID) | INTRAMUSCULAR | Status: DC
  Administered 2014-10-06: 3 mL via INTRAVENOUS

## 2014-10-05 MED ORDER — ACETAMINOPHEN 650 MG RE SUPP: 650.0000 mg | Freq: Four times a day (QID) | RECTAL | Status: DC | PRN

## 2014-10-05 MED ORDER — PIPERACILLIN-TAZOBACTAM 3.375 G IVPB 30 MIN
3.3750 g | Freq: Once | INTRAVENOUS | Status: AC
  Administered 2014-10-05: 3.375 g via INTRAVENOUS
  Filled 2014-10-05 (×2): qty 50

## 2014-10-05 MED ORDER — ONDANSETRON HCL 4 MG/2ML IJ SOLN
4.0000 mg | Freq: Four times a day (QID) | INTRAMUSCULAR | Status: DC | PRN
  Administered 2014-10-07: 4 mg via INTRAVENOUS
  Filled 2014-10-05 (×2): qty 2

## 2014-10-05 MED ORDER — DEXTROSE-NACL 5-0.45 % IV SOLN
INTRAVENOUS | Status: DC
Start: 2014-10-05 — End: 2014-10-05

## 2014-10-05 MED ORDER — SODIUM CHLORIDE 0.9 % IV BOLUS (SEPSIS)
500.0000 mL | Freq: Once | INTRAVENOUS | Status: AC
  Administered 2014-10-05: 500 mL via INTRAVENOUS

## 2014-10-05 MED ORDER — HYDROMORPHONE HCL 1 MG/ML IJ SOLN
0.5000 mg | INTRAMUSCULAR | Status: DC | PRN
  Administered 2014-10-06 (×3): 0.5 mg via INTRAVENOUS
  Filled 2014-10-05 (×3): qty 1

## 2014-10-05 MED ORDER — MORPHINE SULFATE 4 MG/ML IJ SOLN
4.0000 mg | Freq: Once | INTRAMUSCULAR | Status: AC
Start: 2014-10-05 — End: 2014-10-05
  Administered 2014-10-05: 4 mg via INTRAVENOUS
  Filled 2014-10-05: qty 1

## 2014-10-05 MED ORDER — ACETAMINOPHEN 325 MG PO TABS: 650.0000 mg | ORAL_TABLET | Freq: Four times a day (QID) | ORAL | Status: DC | PRN

## 2014-10-05 MED ORDER — PANTOPRAZOLE SODIUM 40 MG PO TBEC
40.0000 mg | DELAYED_RELEASE_TABLET | Freq: Every day | ORAL | Status: DC
  Administered 2014-10-06 – 2014-10-11 (×6): 40 mg via ORAL
  Filled 2014-10-05 (×6): qty 1

## 2014-10-05 MED ORDER — FENTANYL CITRATE (PF) 100 MCG/2ML IJ SOLN
50.0000 ug | Freq: Once | INTRAMUSCULAR | Status: AC
  Administered 2014-10-05: 50 ug via INTRAVENOUS
  Filled 2014-10-05: qty 2

## 2014-10-05 MED ORDER — OXYCODONE HCL 5 MG PO TABS
5.0000 mg | ORAL_TABLET | ORAL | Status: DC | PRN
  Administered 2014-10-06: 5 mg via ORAL
  Filled 2014-10-05: qty 1

## 2014-10-05 MED ORDER — SODIUM CHLORIDE 0.9 % IV SOLN
INTRAVENOUS | Status: DC
  Administered 2014-10-06: 50 mL/h via INTRAVENOUS

## 2014-10-05 MED ORDER — ADULT MULTIVITAMIN W/MINERALS CH
1.0000 | ORAL_TABLET | Freq: Every day | ORAL | Status: DC
  Administered 2014-10-06 – 2014-10-11 (×6): 1 via ORAL
  Filled 2014-10-05 (×6): qty 1

## 2014-10-05 MED ORDER — MORPHINE SULFATE 4 MG/ML IJ SOLN: 4.0000 mg | Freq: Once | INTRAMUSCULAR | Status: DC

## 2014-10-05 MED ORDER — ATORVASTATIN CALCIUM 40 MG PO TABS
80.0000 mg | ORAL_TABLET | Freq: Every day | ORAL | Status: DC
  Filled 2014-10-05: qty 1

## 2014-10-05 MED ORDER — CITALOPRAM HYDROBROMIDE 20 MG PO TABS
40.0000 mg | ORAL_TABLET | Freq: Every day | ORAL | Status: DC
  Administered 2014-10-06: 40 mg via ORAL
  Filled 2014-10-05: qty 1

## 2014-10-05 MED ORDER — ONDANSETRON HCL 4 MG/2ML IJ SOLN
4.0000 mg | Freq: Once | INTRAMUSCULAR | Status: AC
  Administered 2014-10-05: 4 mg via INTRAVENOUS
  Filled 2014-10-05: qty 2

## 2014-10-05 MED ORDER — ALLOPURINOL 300 MG PO TABS
300.0000 mg | ORAL_TABLET | Freq: Every day | ORAL | Status: DC
  Administered 2014-10-06 – 2014-10-11 (×6): 300 mg via ORAL
  Filled 2014-10-05 (×2): qty 1
  Filled 2014-10-05 (×3): qty 3
  Filled 2014-10-05: qty 1

## 2014-10-05 MED ORDER — FOLIC ACID 1 MG PO TABS
1.0000 mg | ORAL_TABLET | Freq: Every day | ORAL | Status: DC
  Administered 2014-10-06 – 2014-10-11 (×6): 1 mg via ORAL
  Filled 2014-10-05 (×5): qty 1

## 2014-10-05 MED ORDER — ONDANSETRON HCL 4 MG PO TABS: 4.0000 mg | ORAL_TABLET | Freq: Four times a day (QID) | ORAL | Status: DC | PRN

## 2014-10-05 MED ORDER — PANTOPRAZOLE SODIUM 40 MG IV SOLR
40.0000 mg | Freq: Once | INTRAVENOUS | Status: AC
  Administered 2014-10-05: 40 mg via INTRAVENOUS
  Filled 2014-10-05: qty 40

## 2014-10-05 MED ORDER — VITAMIN B-1 100 MG PO TABS
100.0000 mg | ORAL_TABLET | Freq: Every day | ORAL | Status: DC
  Administered 2014-10-06 – 2014-10-11 (×6): 100 mg via ORAL
  Filled 2014-10-05 (×6): qty 1

## 2014-10-05 MED ORDER — SUCRALFATE 1 GM/10ML PO SUSP
1.0000 g | Freq: Three times a day (TID) | ORAL | Status: DC
  Filled 2014-10-05 (×5): qty 10

## 2014-10-05 NOTE — H&P (Signed)
[facility] History and Physical  JUNE VACHA ZOX:[id number] DOB: 04/26/1946 DOA: 10/05/2014  Referring physician: Rolan Bucco, MD PCP: Pcp Not In System   Chief Complaint: Abdominal Pain  HPI: Larry [name] is a 69 y.o. male presents with abdominal pain. Patient states that he went to the [place] recently and his medication was changed around. Patient states mostly this was his diabetes medications. He states he started throwing up around Thursday last week. He states that vomiting was intractable. He states that he though his abdominal pain was related to his throwing up. He states that the pain is located on the RUQ. He states that it seems to hurt when he pushes on that side. He has had no diarrhea noted. He states that he has had no cough or congestion. He states he has had no fever that he is awre of. Patient states that he has felt generally weak. In the ED he had an ultrasound done and this shows presence of cholecystitis and also has some gallstones.    Review of Systems:  Constitutional:  No weight loss, night sweats, Fevers, chills, +fatigue.  HEENT:  No headaches, No sneezing, itching, ear ache, nasal congestion, post nasal drip,  Cardio-vascular:  No chest pain, Orthopnea, PND, swelling in lower extremities  GI:  No heartburn, indigestion, ++abdominal pain, ++nausea, ++vomiting, no diarrhea  Resp:  No shortness of breath with exertion or at rest. No coughing up of blood.No change in color of mucus.No wheezing Skin:  no rash or lesions.  GU:  no dysuria, change in color of urine, no urgency or frequency Musculoskeletal:  No joint pain or swelling. No decreased range of motion.  Psych:  No change in mood or affect. No depression or anxiety  Past Medical History  Diagnosis Date  . Diabetes mellitus without complication   . MI (myocardial infarction)   . Hypertension    Past Surgical History  Procedure Laterality Date  . Revision total hip arthroplasty    .  Knee replacement    . Cardiac stent     Social History:  reports that he has never smoked. He does not have any smokeless tobacco history on file. He reports that he does not drink alcohol or use illicit drugs.  No Known Allergies  No family history on file.   Prior to Admission medications   Medication Sig Start Date End Date Taking? Authorizing Provider  Acetaminophen-Codeine (TYLENOL WITH CODEINE #3 PO) Take by mouth.   Yes Historical Provider, MD  Cholecalciferol (VITAMIN D PO) Take by mouth.   Yes Historical Provider, MD  citalopram (CELEXA) 40 MG tablet Take 40 mg by mouth daily.   Yes Historical Provider, MD  Cyanocobalamin (VITAMIN B 12 PO) Take by mouth.   Yes Historical Provider, MD  Liraglutide (VICTOZA [place]) Inject into the skin.   Yes Historical Provider, MD  magnesium oxide (MAG-OX) 400 MG tablet Take 400 mg by mouth daily.   Yes Historical Provider, MD  allopurinol (ZYLOPRIM) 300 MG tablet Take 300 mg by mouth daily.    Historical Provider, MD  amLODipine (NORVASC) 10 MG tablet Take 10 mg by mouth daily.    Historical Provider, MD  apixaban (ELIQUIS) 5 MG TABS tablet Take 1 tablet (5 mg total) by mouth 2 (two) times daily. 10/01/14   April Palumbo, MD  aspirin 81 MG chewable tablet Chew 81 mg by mouth daily.    Historical Provider, MD  atorvastatin (LIPITOR) 80 MG tablet Take 80 mg by mouth  daily.    Historical Provider, MD  furosemide (LASIX) 40 MG tablet Take 40 mg by mouth.    Historical Provider, MD  insulin glargine (LANTUS) 100 UNIT/ML injection Inject 40 Units into the skin at bedtime.    Historical Provider, MD  lisinopril (PRINIVIL,ZESTRIL) 40 MG tablet Take 40 mg by mouth daily.    Historical Provider, MD  metFORMIN (GLUCOPHAGE) 500 MG tablet Take 500 mg by mouth 2 (two) times daily with a meal.    Historical Provider, MD  pantoprazole (PROTONIX) 40 MG tablet Take 40 mg by mouth daily.    Historical Provider, MD  sucralfate (CARAFATE) 1 GM/10ML suspension Take 10 mLs  (1 g total) by mouth 4 (four) times daily -  with meals and at bedtime. 10/01/14   April Palumbo, MD   Physical Exam: Filed Vitals:   10/05/14 2135 10/05/14 2200 10/05/14 2215 10/05/14 2320  BP: 99/61 106/53 110/57 96/48  Pulse: 95 95 91 94  Temp:    98.6 F (37 C)  TempSrc:    Oral  Resp:      Height:      Weight:      SpO2: 95% 92% 97% 98%    Wt Readings from Last 3 Encounters:  10/05/14 117.935 kg (260 lb)  10/01/14 120.657 kg (266 lb)    General:  Appears calm and comfortable Eyes: PERRL, normal lids, irises & conjunctiva ENT: grossly normal hearing, lips & tongue Neck: no LAD, masses or thyromegaly Cardiovascular: RRR, no m/r/g. No LE edema. Respiratory: CTA bilaterally, no w/r/r. Normal respiratory effort. Abdomen: ++RUQ tenderness Skin: no rash or induration seen on limited exam Musculoskeletal: grossly normal tone BUE/BLE Psychiatric: grossly normal mood and affect Neurologic: grossly non-focal.          Labs on Admission:  Basic Metabolic Panel:  Recent Labs Lab 10/01/14 0115 10/01/14 0142 10/05/14 1640  NA 140 142 133*  K 3.8 3.3* 3.8  CL 99 105 98  CO2 27  --  25  GLUCOSE 240* 221* 198*  BUN 24* 20 57*  CREATININE 1.42* 1.20 2.33*  CALCIUM 10.3  --  7.9*   Liver Function Tests:  Recent Labs Lab 10/01/14 0115 10/05/14 1640  AST 28 34  ALT 19 25  ALKPHOS 87 76  BILITOT 2.3* 2.8*  PROT 7.7 6.6  ALBUMIN 4.5 3.0*    Recent Labs Lab 10/01/14 0115 10/05/14 1640  LIPASE 36 25   No results for input(s): AMMONIA in the last 168 hours. CBC:  Recent Labs Lab 10/01/14 0115 10/05/14 1640  WBC 15.2* 11.7*  NEUTROABS 12.4* 9.7*  HGB 13.3 10.6*  HCT 39.9 30.3*  MCV 92.4 89.1  PLT 250 174   Cardiac Enzymes:  Recent Labs Lab 10/01/14 0115  TROPONINI <0.03    BNP (last 3 results) No results for input(s): BNP in the last 8760 hours.  ProBNP (last 3 results) No results for input(s): PROBNP in the last 8760 hours.  CBG:  Recent  Labs Lab 10/01/14 0116  GLUCAP 195*    Radiological Exams on Admission: Ct Abdomen Pelvis Wo Contrast  10/05/2014   CLINICAL DATA:  Upper abdominal pain since last Thursday. Nausea and vomiting.  EXAM: CT ABDOMEN AND PELVIS WITHOUT CONTRAST  TECHNIQUE: Multidetector CT imaging of the abdomen and pelvis was performed following the standard protocol without IV contrast.  COMPARISON:  10/01/2014  FINDINGS: There is bibasilar atelectasis.  There is a punctate nonobstructing left renal calculus. No obstructive uropathy. No perinephric stranding is seen.  The kidneys are symmetric in size without evidence for exophytic mass. The bladder is unremarkable.  The liver demonstrates no focal abnormality. The gallbladder is distended with cholelithiasis, gallbladder wall thickening and pericholecystic inflammatory changes most consistent with acute cholecystitis. The spleen demonstrates no focal abnormality. The adrenal glands and pancreas are normal.  There is a small hiatal hernia. The unopacified stomach, duodenum, small intestine and large intestine are unremarkable, but evaluation is limited by lack of oral contrast. There is no pneumoperitoneum, pneumatosis, or portal venous gas. There is no abdominal or pelvic free fluid. There is no lymphadenopathy.  The abdominal aorta is normal in caliber with atherosclerosis.  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 There is thoracolumbar spine spondylosis most severe at L3-4 and L4-5 with disc space narrowing and bilateral severe facet arthropathy.  IMPRESSION: 1. Cholelithiasis with gallbladder distention, gallbladder wall thickening and pericholecystic inflammatory changes most consistent with acute cholecystitis.   Electronically Signed   By: Elige Ko   On: 10/05/2014 19:08   US Abdomen Complete  10/05/2014   CLINICAL DATA:  Acute onset of right upper quadrant pain 6 days ago. Symptoms are worsening.  EXAM: ULTRASOUND ABDOMEN COMPLETE  COMPARISON:  CT abdomen same  day.  FINDINGS: Gallbladder: Gallstones dependent in the gallbladder. Mild wall thickening. Positive Murphy sign. Small amount of adjacent fluid. Findings all consistent with acute cholecystitis.  Common bile duct: Diameter: 4 mm, normal  Liver: Diffuse fatty change.  No focal lesion seen.  IVC: No abnormality visualized.  Pancreas: Poorly seen because of overlying bowel gas.  Spleen: Poorly seen because of overlying bowel gas.  Right Kidney: Length: 11.0 cm. Echogenicity within normal limits. No mass or hydronephrosis visualized.  Left Kidney: Length: 10.9 cm. Echogenicity within normal limits. No mass or hydronephrosis visualized.  Abdominal aorta: No aneurysm visualized.  Other findings: None.  IMPRESSION: Consistent with acute cholecystitis. Gallstones. Wall thickening. Small amount appear cholecystic fluid. Positive Murphy sign.   Electronically Signed   By: Paulina Fusi M.D.   On: 10/05/2014 19:48      Assessment/Plan Principal Problem:   Cholecystitis Active Problems:   Diabetes mellitus without complication   HTN (hypertension)   Hypotension   Anemia   1. Acute Cholecystitis with gallstones -ultrasound results noted as cholecystitis with gallstones -will start on zosyn -surgery consultation Dr Johna Sheriff is aware of the patient -may need ERCP await surgery and GI input  2. Diabetes Mellitus -will monitor FSBS -start on insulin coverage  3. Hypotension -related to acute infection -will hydrate aggressively -hold antihypertensive medications for now as pressures are soft  4. Anemia -will check iron studies -will monitor labs -will get occult blood stool  Code Status: Full Code (must indicate code status--if unknown or must be presumed, indicate so) DVT Prophylaxis:SCD Family Communication: None (indicate person spoken with, if applicable, with phone number if by telephone) Disposition Plan: Home (indicate anticipated LOS)  Time spent:  [facility] Pager [phone]

## 2014-10-05 NOTE — ED Notes (Signed)
Pts oxygen levels dropped to 82%. 4L o2 applied to the patient.  Pts BP continues to be low

## 2014-10-05 NOTE — ED Notes (Signed)
Patient transported to CT and US. ?

## 2014-10-05 NOTE — ED Provider Notes (Addendum)
CSN: [id number]     Arrival date & time 10/05/14  1600 History   First MD Initiated Contact with Patient 10/05/14 1618     Chief Complaint  Patient presents with  . Abdominal Pain     (Consider location/radiation/quality/duration/timing/severity/associated sxs/prior Treatment) HPI Comments: Patient with a history of hypertension hyperlipidemia and diabetes presents with abdominal pain. He was seen here on April 15 for the same abdominal pain. He states it started earlier that day with vomiting. He states he had multiple episodes of vomiting and then began having pain across his upper abdomen.  He was found to be in atrial fibrillation at that time. He had a CT of his chest abdomen and pelvis with angiography. This was negative for aortic dissection or other acute abnormality. There was noted gallstones present. Patient got relief from a GI cocktail. He was discharged home with prescriptions for Carafate and after consultation with the cardiologist was started on Eliquis due to the a-fib.  However he has not yet gotten these prescriptions filled. He states his pain is continued and worsened since that time. It's a constant crampy pain across his upper abdomen. It catches and is worse at times. He has nausea whenever he tries to eat anything and has had very little to eat or drink over the last few days. He denies any fevers. He's had decreased urination and decreased bowel movements. He thinks it's because he hasn't really been eating or drinking anything.  Patient is a 69 y.o. male presenting with abdominal pain.  Abdominal Pain Associated symptoms: nausea   Associated symptoms: no chest pain, no chills, no cough, no diarrhea, no fatigue, no fever, no hematuria, no shortness of breath and no vomiting     Past Medical History  Diagnosis Date  . Diabetes mellitus without complication   . MI (myocardial infarction)   . Hypertension    Past Surgical History  Procedure Laterality Date  .  Revision total hip arthroplasty    . Knee replacement    . Cardiac stent     No family history on file. History  Substance Use Topics  . Smoking status: Never Smoker   . Smokeless tobacco: Not on file  . Alcohol Use: No    Review of Systems  Constitutional: Negative for fever, chills, diaphoresis and fatigue.  HENT: Negative for congestion, rhinorrhea and sneezing.   Eyes: Negative.   Respiratory: Negative for cough, chest tightness and shortness of breath.   Cardiovascular: Negative for chest pain and leg swelling.  Gastrointestinal: Positive for nausea and abdominal pain. Negative for vomiting, diarrhea and blood in stool.  Genitourinary: Negative for frequency, hematuria, flank pain and difficulty urinating.  Musculoskeletal: Negative for back pain and arthralgias.  Skin: Negative for rash.  Neurological: Negative for dizziness, speech difficulty, weakness, numbness and headaches.      Allergies  Review of patient's allergies indicates no known allergies.  Home Medications   Prior to Admission medications   Medication Sig Start Date End Date Taking? Authorizing Provider  Acetaminophen-Codeine (TYLENOL WITH CODEINE #3 PO) Take by mouth.   Yes Historical Provider, MD  Cholecalciferol (VITAMIN D PO) Take by mouth.   Yes Historical Provider, MD  citalopram (CELEXA) 40 MG tablet Take 40 mg by mouth daily.   Yes Historical Provider, MD  Cyanocobalamin (VITAMIN B 12 PO) Take by mouth.   Yes Historical Provider, MD  Liraglutide (VICTOZA [place]) Inject into the skin.   Yes Historical Provider, MD  magnesium oxide (MAG-OX) 400 MG  tablet Take 400 mg by mouth daily.   Yes Historical Provider, MD  allopurinol (ZYLOPRIM) 300 MG tablet Take 300 mg by mouth daily.    Historical Provider, MD  amLODipine (NORVASC) 10 MG tablet Take 10 mg by mouth daily.    Historical Provider, MD  apixaban (ELIQUIS) 5 MG TABS tablet Take 1 tablet (5 mg total) by mouth 2 (two) times daily. 10/01/14   April  Palumbo, MD  aspirin 81 MG chewable tablet Chew 81 mg by mouth daily.    Historical Provider, MD  atorvastatin (LIPITOR) 80 MG tablet Take 80 mg by mouth daily.    Historical Provider, MD  furosemide (LASIX) 40 MG tablet Take 40 mg by mouth.    Historical Provider, MD  insulin glargine (LANTUS) 100 UNIT/ML injection Inject 40 Units into the skin at bedtime.    Historical Provider, MD  lisinopril (PRINIVIL,ZESTRIL) 40 MG tablet Take 40 mg by mouth daily.    Historical Provider, MD  metFORMIN (GLUCOPHAGE) 500 MG tablet Take 500 mg by mouth 2 (two) times daily with a meal.    Historical Provider, MD  pantoprazole (PROTONIX) 40 MG tablet Take 40 mg by mouth daily.    Historical Provider, MD  sucralfate (CARAFATE) 1 GM/10ML suspension Take 10 mLs (1 g total) by mouth 4 (four) times daily -  with meals and at bedtime. 10/01/14   April Palumbo, MD   BP 114/60 mmHg  Pulse 97  Temp(Src) 99.5 F (37.5 C) (Oral)  Resp 27  Ht 5\' 7"  (1.702 m)  Wt 260 lb (117.935 kg)  BMI 40.71 kg/m2  SpO2 95% Physical Exam  Constitutional: He is oriented to person, place, and time. He appears well-developed and well-nourished.  HENT:  Head: Normocephalic and atraumatic.  Eyes: Pupils are equal, round, and reactive to light.  Neck: Normal range of motion. Neck supple.  Cardiovascular: Normal rate, regular rhythm and normal heart sounds.   Pulmonary/Chest: Effort normal and breath sounds normal. No respiratory distress. He has no wheezes. He has no rales. He exhibits no tenderness.  Abdominal: Soft. Bowel sounds are normal. There is tenderness (moderate tenderness across the upper abdomen). There is no rebound and no guarding.  Musculoskeletal: Normal range of motion. He exhibits edema.  Lymphadenopathy:    He has no cervical adenopathy.  Neurological: He is alert and oriented to person, place, and time.  Skin: Skin is warm and dry. No rash noted.  Psychiatric: He has a normal mood and affect.    ED Course   Procedures (including critical care time) Labs Review Labs Reviewed  CBC WITH DIFFERENTIAL/PLATELET - Abnormal; Notable for the following:    WBC 11.7 (*)    RBC 3.40 (*)    Hemoglobin 10.6 (*)    HCT 30.3 (*)    Neutrophils Relative % 83 (*)    Neutro Abs 9.7 (*)    Lymphocytes Relative 4 (*)    Lymphs Abs 0.5 (*)    Monocytes Relative 13 (*)    Monocytes Absolute 1.5 (*)    All other components within normal limits  COMPREHENSIVE METABOLIC PANEL - Abnormal; Notable for the following:    Sodium 133 (*)    Glucose, Bld 198 (*)    BUN 57 (*)    Creatinine, Ser 2.33 (*)    Calcium 7.9 (*)    Albumin 3.0 (*)    Total Bilirubin 2.8 (*)    GFR calc non Af Amer 27 (*)    GFR calc Af [place]  31 (*)    All other components within normal limits  URINALYSIS, ROUTINE W REFLEX MICROSCOPIC - Abnormal; Notable for the following:    APPearance CLOUDY (*)    All other components within normal limits  LIPASE, BLOOD  I-STAT CG4 LACTIC ACID, ED    Imaging Review Ct Abdomen Pelvis Wo Contrast  10/05/2014   CLINICAL DATA:  Upper abdominal pain since last Thursday. Nausea and vomiting.  EXAM: CT ABDOMEN AND PELVIS WITHOUT CONTRAST  TECHNIQUE: Multidetector CT imaging of the abdomen and pelvis was performed following the standard protocol without IV contrast.  COMPARISON:  10/01/2014  FINDINGS: There is bibasilar atelectasis.  There is a punctate nonobstructing left renal calculus. No obstructive uropathy. No perinephric stranding is seen. The kidneys are symmetric in size without evidence for exophytic mass. The bladder is unremarkable.  The liver demonstrates no focal abnormality. The gallbladder is distended with cholelithiasis, gallbladder wall thickening and pericholecystic inflammatory changes most consistent with acute cholecystitis. The spleen demonstrates no focal abnormality. The adrenal glands and pancreas are normal.  There is a small hiatal hernia. The unopacified stomach, duodenum, small  intestine and large intestine are unremarkable, but evaluation is limited by lack of oral contrast. There is no pneumoperitoneum, pneumatosis, or portal venous gas. There is no abdominal or pelvic free fluid. There is no lymphadenopathy.  The abdominal aorta is normal in caliber with atherosclerosis.  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 There is thoracolumbar spine spondylosis most severe at L3-4 and L4-5 with disc space narrowing and bilateral severe facet arthropathy.  IMPRESSION: 1. Cholelithiasis with gallbladder distention, gallbladder wall thickening and pericholecystic inflammatory changes most consistent with acute cholecystitis.   Electronically Signed   By: Elige Ko   On: 10/05/2014 19:08   US Abdomen Complete  10/05/2014   CLINICAL DATA:  Acute onset of right upper quadrant pain 6 days ago. Symptoms are worsening.  EXAM: ULTRASOUND ABDOMEN COMPLETE  COMPARISON:  CT abdomen same day.  FINDINGS: Gallbladder: Gallstones dependent in the gallbladder. Mild wall thickening. Positive Murphy sign. Small amount of adjacent fluid. Findings all consistent with acute cholecystitis.  Common bile duct: Diameter: 4 mm, normal  Liver: Diffuse fatty change.  No focal lesion seen.  IVC: No abnormality visualized.  Pancreas: Poorly seen because of overlying bowel gas.  Spleen: Poorly seen because of overlying bowel gas.  Right Kidney: Length: 11.0 cm. Echogenicity within normal limits. No mass or hydronephrosis visualized.  Left Kidney: Length: 10.9 cm. Echogenicity within normal limits. No mass or hydronephrosis visualized.  Abdominal aorta: No aneurysm visualized.  Other findings: None.  IMPRESSION: Consistent with acute cholecystitis. Gallstones. Wall thickening. Small amount appear cholecystic fluid. Positive Murphy sign.   Electronically Signed   By: Paulina Fusi M.D.   On: 10/05/2014 19:48     EKG Interpretation   Date/Time:  Tuesday October 05 2014 16:59:31 EDT Ventricular Rate:  99 PR Interval:   188 QRS Duration: 118 QT Interval:  344 QTC Calculation: 441 R Axis:   -20 Text Interpretation:  Normal sinus rhythm Left ventricular hypertrophy  with QRS widening and repolarization abnormality Inferior infarct , age  undetermined Abnormal ECG Confirmed by Randle Shatzer  MD, Jazline Cumbee ([id number]) on  10/05/2014 5:37:14 PM      MDM   Final diagnoses:  Cholecystitis    Pt presents with upper abd pain.  Had recent angio CT of chest, abd/pelvis neg other than cholelithiasis.  Will recheck labs, do gallbladder u/s.  17:20 BP low, getting fluid bolus,  will check lactate, repeat CT abd/pelvis.  CT shows cholecystitis.  BP responding to fluids.  Lactate normal.  [facility]  [facility]. But requests that the patient be admitted to the hospitalist service as he may need ERCP. After he paged the hospitalist 3 times they finally called back and stated that [facility] is currently on diversion and they cannot accept any patients at this time.  [facility] on [facility].  [facility] and may need GI consult.  Spoke with Dr. Welton FlakesKhan who has accepted pt.  Pt given zosyn.  Rolan BuccoMelanie Samhita Kretsch, MD 10/05/14 2118  Rolan BuccoMelanie Kedrick Mcnamee, MD 10/05/14 2118

## 2014-10-05 NOTE — ED Notes (Signed)
C/o cont'd abd pain since last Thursday-seen here-[facility]no tests done-advised to return to ED

## 2014-10-05 NOTE — ED Notes (Signed)
Pt did not fill RX x 2 from 4/15 [facility]

## 2014-10-06 ENCOUNTER — Inpatient Hospital Stay (HOSPITAL_COMMUNITY): Payer: Medicare Other

## 2014-10-06 ENCOUNTER — Encounter (HOSPITAL_COMMUNITY): Payer: Self-pay | Admitting: General Surgery

## 2014-10-06 DIAGNOSIS — I2510 Atherosclerotic heart disease of native coronary artery without angina pectoris: Secondary | ICD-10-CM | POA: Diagnosis present

## 2014-10-06 DIAGNOSIS — R6521 Severe sepsis with septic shock: Secondary | ICD-10-CM

## 2014-10-06 DIAGNOSIS — R0600 Dyspnea, unspecified: Secondary | ICD-10-CM | POA: Insufficient documentation

## 2014-10-06 DIAGNOSIS — K810 Acute cholecystitis: Secondary | ICD-10-CM | POA: Insufficient documentation

## 2014-10-06 DIAGNOSIS — A419 Sepsis, unspecified organism: Secondary | ICD-10-CM | POA: Insufficient documentation

## 2014-10-06 LAB — COMPREHENSIVE METABOLIC PANEL
ALBUMIN: 2.7 g/dL — AB (ref 3.5–5.2)
ALT: 26 U/L (ref 0–53)
ANION GAP: 10 (ref 5–15)
AST: 34 U/L (ref 0–37)
Alkaline Phosphatase: 74 U/L (ref 39–117)
BILIRUBIN TOTAL: 2.5 mg/dL — AB (ref 0.3–1.2)
BUN: 52 mg/dL — ABNORMAL HIGH (ref 6–23)
CALCIUM: 8 mg/dL — AB (ref 8.4–10.5)
CO2: 24 mmol/L (ref 19–32)
CREATININE: 2.3 mg/dL — AB (ref 0.50–1.35)
Chloride: 103 mmol/L (ref 96–112)
GFR calc Af Amer: 32 mL/min — ABNORMAL LOW (ref >90)
GFR, EST NON AFRICAN AMERICAN: 28 mL/min — AB (ref >90)
Glucose, Bld: 120 mg/dL — ABNORMAL HIGH (ref 70–99)
Potassium: 3.5 mmol/L (ref 3.5–5.1)
Sodium: 137 mmol/L (ref 135–145)
Total Protein: 6 g/dL (ref 6.0–8.3)

## 2014-10-06 LAB — CBC
HCT: 27.8 % — ABNORMAL LOW (ref 39.0–52.0)
Hemoglobin: 9.5 g/dL — ABNORMAL LOW (ref 13.0–17.0)
MCH: 31 pg (ref 26.0–34.0)
MCHC: 34.2 g/dL (ref 30.0–36.0)
MCV: 90.8 fL (ref 78.0–100.0)
Platelets: 167 10*3/uL (ref 150–400)
RBC: 3.06 MIL/uL — AB (ref 4.22–5.81)
RDW: 16.3 % — ABNORMAL HIGH (ref 11.5–15.5)
WBC: 10.5 10*3/uL (ref 4.0–10.5)

## 2014-10-06 LAB — GLUCOSE, CAPILLARY
GLUCOSE-CAPILLARY: 102 mg/dL — AB (ref 70–99)
GLUCOSE-CAPILLARY: 191 mg/dL — AB (ref 70–99)
Glucose-Capillary: 149 mg/dL — ABNORMAL HIGH (ref 70–99)
Glucose-Capillary: 125 mg/dL — ABNORMAL HIGH (ref 70–99)
Glucose-Capillary: 147 mg/dL — ABNORMAL HIGH (ref 70–99)
Glucose-Capillary: 171 mg/dL — ABNORMAL HIGH (ref 70–99)

## 2014-10-06 LAB — CARBOXYHEMOGLOBIN
CARBOXYHEMOGLOBIN: 1.3 % (ref 0.5–1.5)
Methemoglobin: 1.1 % (ref 0.0–1.5)
O2 Saturation: 73.1 %
Total hemoglobin: 10.7 g/dL — ABNORMAL LOW (ref 13.5–18.0)

## 2014-10-06 LAB — APTT: aPTT: 34 seconds (ref 24–37)

## 2014-10-06 LAB — IRON AND TIBC
Iron: 17 ug/dL — ABNORMAL LOW (ref 42–165)
SATURATION RATIOS: 11 % — AB (ref 20–55)
TIBC: 160 ug/dL — ABNORMAL LOW (ref 215–435)
UIBC: 143 ug/dL (ref 125–400)

## 2014-10-06 LAB — VITAMIN B12: Vitamin B-12: 613 pg/mL (ref 211–911)

## 2014-10-06 LAB — TROPONIN I: Troponin I: 0.05 ng/mL — ABNORMAL HIGH (ref <0.031)

## 2014-10-06 LAB — SURGICAL PCR SCREEN
MRSA, PCR: NEGATIVE
STAPHYLOCOCCUS AUREUS: NEGATIVE

## 2014-10-06 LAB — PROTIME-INR
INR: 1.07 (ref 0.00–1.49)
PROTHROMBIN TIME: 14 s (ref 11.6–15.2)

## 2014-10-06 LAB — FERRITIN: Ferritin: 377 ng/mL — ABNORMAL HIGH (ref 22–322)

## 2014-10-06 LAB — TSH: TSH: 3.295 u[IU]/mL (ref 0.350–4.500)

## 2014-10-06 MED ORDER — HYDROMORPHONE HCL 1 MG/ML IJ SOLN
0.5000 mg | INTRAMUSCULAR | Status: DC | PRN
  Administered 2014-10-06 (×2): 1 mg via INTRAVENOUS
  Administered 2014-10-06: 0.5 mg via INTRAVENOUS
  Filled 2014-10-06: qty 2
  Filled 2014-10-06 (×3): qty 1

## 2014-10-06 MED ORDER — CHLORHEXIDINE GLUCONATE 0.12 % MT SOLN
15.0000 mL | Freq: Two times a day (BID) | OROMUCOSAL | Status: DC
  Administered 2014-10-07 – 2014-10-11 (×7): 15 mL via OROMUCOSAL
  Filled 2014-10-06 (×3): qty 15

## 2014-10-06 MED ORDER — INSULIN ASPART 100 UNIT/ML ~~LOC~~ SOLN
0.0000 [IU] | SUBCUTANEOUS | Status: DC
  Administered 2014-10-06: 3 [IU] via SUBCUTANEOUS
  Administered 2014-10-06: 4 [IU] via SUBCUTANEOUS
  Administered 2014-10-06 (×2): 3 [IU] via SUBCUTANEOUS

## 2014-10-06 MED ORDER — CETYLPYRIDINIUM CHLORIDE 0.05 % MT LIQD
7.0000 mL | Freq: Two times a day (BID) | OROMUCOSAL | Status: DC
  Administered 2014-10-06 – 2014-10-10 (×8): 7 mL via OROMUCOSAL

## 2014-10-06 MED ORDER — PIPERACILLIN-TAZOBACTAM 3.375 G IVPB
3.3750 g | Freq: Three times a day (TID) | INTRAVENOUS | Status: DC
  Administered 2014-10-06 – 2014-10-11 (×17): 3.375 g via INTRAVENOUS
  Filled 2014-10-06 (×21): qty 50

## 2014-10-06 MED ORDER — ATROPINE SULFATE 0.1 MG/ML IJ SOLN
INTRAMUSCULAR | Status: AC
  Filled 2014-10-06: qty 10

## 2014-10-06 MED ORDER — INSULIN ASPART 100 UNIT/ML ~~LOC~~ SOLN
2.0000 [IU] | SUBCUTANEOUS | Status: DC
  Administered 2014-10-07: 6 [IU] via SUBCUTANEOUS
  Administered 2014-10-07 (×2): 4 [IU] via SUBCUTANEOUS
  Administered 2014-10-07: 6 [IU] via SUBCUTANEOUS

## 2014-10-06 MED ORDER — IOHEXOL 300 MG/ML  SOLN: 10.0000 mL | Freq: Once | INTRAMUSCULAR | Status: AC | PRN

## 2014-10-06 MED ORDER — CETYLPYRIDINIUM CHLORIDE 0.05 % MT LIQD: 7.0000 mL | Freq: Two times a day (BID) | OROMUCOSAL | Status: DC

## 2014-10-06 MED ORDER — CHLORHEXIDINE GLUCONATE 0.12 % MT SOLN
15.0000 mL | Freq: Two times a day (BID) | OROMUCOSAL | Status: DC
  Filled 2014-10-06 (×2): qty 15

## 2014-10-06 MED ORDER — LIDOCAINE HCL 1 % IJ SOLN
INTRAMUSCULAR | Status: AC
  Filled 2014-10-06: qty 20

## 2014-10-06 MED ORDER — SODIUM CHLORIDE 0.9 % IV BOLUS (SEPSIS)
500.0000 mL | Freq: Once | INTRAVENOUS | Status: AC
  Administered 2014-10-06: 500 mL via INTRAVENOUS

## 2014-10-06 MED ORDER — FENTANYL CITRATE (PF) 100 MCG/2ML IJ SOLN
INTRAMUSCULAR | Status: AC
  Filled 2014-10-06: qty 4

## 2014-10-06 MED ORDER — MIDAZOLAM HCL 2 MG/2ML IJ SOLN
INTRAMUSCULAR | Status: AC
  Filled 2014-10-06: qty 6

## 2014-10-06 MED ORDER — NOREPINEPHRINE BITARTRATE 1 MG/ML IV SOLN
0.0000 ug/min | INTRAVENOUS | Status: DC
  Administered 2014-10-06: 5 ug/min via INTRAVENOUS
  Administered 2014-10-07: 6 ug/min via INTRAVENOUS
  Administered 2014-10-07: 8 ug/min via INTRAVENOUS
  Administered 2014-10-07: 12 ug/min via INTRAVENOUS
  Administered 2014-10-08: 6 ug/min via INTRAVENOUS
  Filled 2014-10-06 (×5): qty 4

## 2014-10-06 MED ORDER — SODIUM CHLORIDE 0.9 % IV SOLN
INTRAVENOUS | Status: DC
  Administered 2014-10-06: 23:00:00 via INTRAVENOUS

## 2014-10-06 MED ORDER — EPINEPHRINE HCL 0.1 MG/ML IJ SOSY
PREFILLED_SYRINGE | INTRAMUSCULAR | Status: AC
  Filled 2014-10-06: qty 10

## 2014-10-06 NOTE — Sedation Documentation (Signed)
Moderate Sedation contraindicated d/t hypoxia and hypotension.  Pt on 4lpm [place] with O2 sats 93%.  Pt placed to 100% NRB with O2 Sats 98%.  Discussed case with Dr. Bonnielee HaffHoss.  Willing to proceed without sedation.  He explained this to the patient who was also willing to proceed.

## 2014-10-06 NOTE — Procedures (Signed)
10 Fr Chole drain No comp

## 2014-10-06 NOTE — Consult Note (Signed)
Larry [name] 1945/09/24  [id number].   Primary Care MD: Dr. Milinda Pointer at [facility] Requesting MD: Dr. Malvin Johns Chief Complaint/Reason for Consult: acute cholecystitis HPI: This is a 69 yo white male who is morbidly obese with a history of an MI 2 years ago with no follow up (sounds like he had DES placed and was on plavix for a year.  MD in [place] did cath and procedure and told the patient he never needed to follow up, per pt and family), CAD, DM, HTN, and a recent dx of A fib.  Last Thursday he saw his PCP for a routine visit.  Some of his DM meds were adjusted.  After he got home, he began having multiple episodes of emesis.  He then developed abdominal pain in the RUQ, but he thought this was just from throwing up so much.  He was told to go to the ED that night, where he was found to be in new onset a fib.  Eliquis was prescribed, but the [place] doctor apparently told him not to get it filled and so he has not started taking it.  His abdominal pain continued along with his nausea and vomiting throughout the weekend and early this week.  He denies any fevers or diarrhea.  He has not been able to eat or really drink.  He saw his PCP again yesterday, who apparently still was unclear about what was going on and sent him to the ED again.  The patient went to [place] ED last night.  He was found to have an elevated creatinine of 2.33 along with a slight elevation of his WBC.  A CT scan was completed that revealed acute cholecystitis.  An [place] was also ordered that confirmed acute cholecystitis with cholelithiasis.  His LFTs are all normal except his TB is 2.5.  His CBD was only 8mm on [place] with no evidence of choledocholithiasis.  The patient was admitted by the hospitalist and we have been asked to evaluate the patient for further recommendations.  ROS : Please see HPI, otherwise negative  History reviewed. No pertinent family history.  Past Medical History  Diagnosis Date  . Diabetes mellitus  without complication   . MI (myocardial infarction)   . Hypertension   . Atrial fibrillation 10-01-14  . Coronary artery disease   . Obesity     Past Surgical History  Procedure Laterality Date  . Revision total hip arthroplasty    . Knee replacement    . Cardiac stent    . Cardiac catheterization      with stent placement in 2014  . Ankle surgery Left     Social History:  reports that he has never smoked. He does not have any smokeless tobacco history on file. He reports that he does not drink alcohol or use illicit drugs.  Allergies: No Known Allergies  Medications Prior to Admission  Medication Sig Dispense Refill  . acetaminophen-codeine (TYLENOL #3) 300-30 MG per tablet Take 1 tablet by mouth 2 (two) times daily.    Marland Kitchen allopurinol (ZYLOPRIM) 300 MG tablet Take 300 mg by mouth daily.    Marland Kitchen amLODipine (NORVASC) 10 MG tablet Take 10 mg by mouth daily.    Marland Kitchen apixaban (ELIQUIS) 5 MG TABS tablet Take 1 tablet (5 mg total) by mouth 2 (two) times daily. 60 tablet 0  . aspirin 81 MG chewable tablet Chew 81 mg by mouth daily.    Marland Kitchen atorvastatin (LIPITOR) 80 MG tablet Take 80  mg by mouth daily.    . Cholecalciferol (VITAMIN D PO) Take 1 tablet by mouth daily.     . citalopram (CELEXA) 40 MG tablet Take 40 mg by mouth daily.    . Cyanocobalamin (VITAMIN B 12 PO) Take 1 tablet by mouth daily.     . furosemide (LASIX) 40 MG tablet Take 40 mg by mouth daily.     . insulin glargine (LANTUS) 100 UNIT/ML injection Inject 40 Units into the skin at bedtime.    . Liraglutide 18 MG/3ML SOPN Inject 1.2 mg into the skin daily.    Marland Kitchen lisinopril (PRINIVIL,ZESTRIL) 40 MG tablet Take 40 mg by mouth daily.    . magnesium oxide (MAG-OX) 400 MG tablet Take 400 mg by mouth daily.    . metFORMIN (GLUCOPHAGE) 500 MG tablet Take 500 mg by mouth 2 (two) times daily with a meal.    . pantoprazole (PROTONIX) 40 MG tablet Take 40 mg by mouth daily.    . sucralfate (CARAFATE) 1 GM/10ML suspension Take 10 mLs (1 g  total) by mouth 4 (four) times daily -  with meals and at bedtime. 420 mL 0    Blood pressure 97/49, pulse 88, temperature 99.2 F (37.3 C), temperature source Oral, resp. rate 18, height $RemoveBe'5\' 7"'AICXmhQSD$  (1.702 m), weight 117.935 kg (260 lb), SpO2 96 %. Physical Exam: General: pleasant, obese white male who is laying in bed in mild distress secondary to abdominal pain. HEENT: head is normocephalic, atraumatic.  Sclera are noninjected, but maybe slightly icteric.  PERRL.  Ears and nose without any masses or lesions.  Mouth is pink and moist Heart: regular, rate, and rhythm.  Normal s1,s2. No obvious murmurs, gallops, or rubs noted.  Palpable radial and pedal pulses bilaterally Lungs: CTAB, no wheezes, rhonchi, or rales noted.  Respiratory effort nonlabored Abd: soft, very tender in RUQ with +Murphy's sign and voluntary guarding, ND, but obese, +BS, no masses, hernias, or organomegaly MS: all 4 extremities are symmetrical with no cyanosis, clubbing, or edema. Skin: warm and dry with no masses, lesions, or rashes Psych: A&Ox3 with an appropriate affect.    Results for orders placed or performed during the hospital encounter of 10/05/14 (from the past 48 hour(s))  CBC with Differential     Status: Abnormal   Collection Time: 10/05/14  4:40 PM  Result Value Ref Range   WBC 11.7 (H) 4.0 - 10.5 K/uL   RBC 3.40 (L) 4.22 - 5.81 MIL/uL   Hemoglobin 10.6 (L) 13.0 - 17.0 g/dL   HCT 30.3 (L) 39.0 - 52.0 %   MCV 89.1 78.0 - 100.0 fL   MCH 31.2 26.0 - 34.0 pg   MCHC 35.0 30.0 - 36.0 g/dL   RDW 15.4 11.5 - 15.5 %   Platelets 174 150 - 400 K/uL   Neutrophils Relative % 83 (H) 43 - 77 %   Neutro Abs 9.7 (H) 1.7 - 7.7 K/uL   Lymphocytes Relative 4 (L) 12 - 46 %   Lymphs Abs 0.5 (L) 0.7 - 4.0 K/uL   Monocytes Relative 13 (H) 3 - 12 %   Monocytes Absolute 1.5 (H) 0.1 - 1.0 K/uL   Eosinophils Relative 0 0 - 5 %   Eosinophils Absolute 0.0 0.0 - 0.7 K/uL   Basophils Relative 0 0 - 1 %   Basophils Absolute 0.0  0.0 - 0.1 K/uL  Comprehensive metabolic panel     Status: Abnormal   Collection Time: 10/05/14  4:40 PM  Result Value  Ref Range   Sodium 133 (L) 135 - 145 mmol/L   Potassium 3.8 3.5 - 5.1 mmol/L   Chloride 98 96 - 112 mmol/L   CO2 25 19 - 32 mmol/L   Glucose, Bld 198 (H) 70 - 99 mg/dL   BUN 57 (H) 6 - 23 mg/dL   Creatinine, Ser 7.01 (H) 0.50 - 1.35 mg/dL   Calcium 7.9 (L) 8.4 - 10.5 mg/dL   Total Protein 6.6 6.0 - 8.3 g/dL   Albumin 3.0 (L) 3.5 - 5.2 g/dL   AST 34 0 - 37 U/L   ALT 25 0 - 53 U/L   Alkaline Phosphatase 76 39 - 117 U/L   Total Bilirubin 2.8 (H) 0.3 - 1.2 mg/dL   GFR calc non Af Amer 27 (L) >90 mL/min   GFR calc Af Amer 31 (L) >90 mL/min    Comment: (NOTE) The eGFR has been calculated using the CKD EPI equation. This calculation has not been validated in all clinical situations. eGFR's persistently <90 mL/min signify possible Chronic Kidney Disease.    Anion gap 10 5 - 15  Lipase, blood     Status: None   Collection Time: 10/05/14  4:40 PM  Result Value Ref Range   Lipase 25 11 - 59 U/L  I-Stat CG4 Lactic Acid, ED     Status: None   Collection Time: 10/05/14  5:39 PM  Result Value Ref Range   Lactic Acid, Venous 1.63 0.5 - 2.0 mmol/L  Urinalysis, Routine w reflex microscopic     Status: Abnormal   Collection Time: 10/05/14  7:27 PM  Result Value Ref Range   Color, Urine YELLOW YELLOW   APPearance CLOUDY (A) CLEAR   Specific Gravity, Urine 1.013 1.005 - 1.030   pH 5.0 5.0 - 8.0   Glucose, UA NEGATIVE NEGATIVE mg/dL   Hgb urine dipstick NEGATIVE NEGATIVE   Bilirubin Urine NEGATIVE NEGATIVE   Ketones, ur NEGATIVE NEGATIVE mg/dL   Protein, ur NEGATIVE NEGATIVE mg/dL   Urobilinogen, UA 0.2 0.0 - 1.0 mg/dL   Nitrite NEGATIVE NEGATIVE   Leukocytes, UA NEGATIVE NEGATIVE    Comment: MICROSCOPIC NOT DONE ON URINES WITH NEGATIVE PROTEIN, BLOOD, LEUKOCYTES, NITRITE, OR GLUCOSE <1000 mg/dL.  Glucose, capillary     Status: Abnormal   Collection Time: 10/06/14  1:28  AM  Result Value Ref Range   Glucose-Capillary 149 (H) 70 - 99 mg/dL  TSH     Status: None   Collection Time: 10/06/14  4:45 AM  Result Value Ref Range   TSH 3.295 0.350 - 4.500 uIU/mL  Comprehensive metabolic panel     Status: Abnormal   Collection Time: 10/06/14  4:45 AM  Result Value Ref Range   Sodium 137 135 - 145 mmol/L   Potassium 3.5 3.5 - 5.1 mmol/L   Chloride 103 96 - 112 mmol/L   CO2 24 19 - 32 mmol/L   Glucose, Bld 120 (H) 70 - 99 mg/dL   BUN 52 (H) 6 - 23 mg/dL   Creatinine, Ser 9.39 (H) 0.50 - 1.35 mg/dL   Calcium 8.0 (L) 8.4 - 10.5 mg/dL   Total Protein 6.0 6.0 - 8.3 g/dL   Albumin 2.7 (L) 3.5 - 5.2 g/dL   AST 34 0 - 37 U/L   ALT 26 0 - 53 U/L   Alkaline Phosphatase 74 39 - 117 U/L   Total Bilirubin 2.5 (H) 0.3 - 1.2 mg/dL   GFR calc non Af Amer 28 (L) >90 mL/min  GFR calc Af Amer 32 (L) >90 mL/min    Comment: (NOTE) The eGFR has been calculated using the CKD EPI equation. This calculation has not been validated in all clinical situations. eGFR's persistently <90 mL/min signify possible Chronic Kidney Disease.    Anion gap 10 5 - 15  CBC     Status: Abnormal   Collection Time: 10/06/14  4:45 AM  Result Value Ref Range   WBC 10.5 4.0 - 10.5 K/uL   RBC 3.06 (L) 4.22 - 5.81 MIL/uL   Hemoglobin 9.5 (L) 13.0 - 17.0 g/dL   HCT 27.8 (L) 39.0 - 52.0 %   MCV 90.8 78.0 - 100.0 fL   MCH 31.0 26.0 - 34.0 pg   MCHC 34.2 30.0 - 36.0 g/dL   RDW 16.3 (H) 11.5 - 15.5 %   Platelets 167 150 - 400 K/uL  Protime-INR     Status: None   Collection Time: 10/06/14  4:45 AM  Result Value Ref Range   Prothrombin Time 14.0 11.6 - 15.2 seconds   INR 1.07 0.00 - 1.49    Comment: Performed at [facility]  APTT     Status: None   Collection Time: 10/06/14  4:45 AM  Result Value Ref Range   aPTT 34 24 - 37 seconds    Comment: Performed at [facility]  Iron and TIBC     Status: Abnormal   Collection Time: 10/06/14  4:45 AM  Result Value Ref Range   Iron 17 (L)  42 - 165 ug/dL   TIBC 160 (L) 215 - 435 ug/dL   Saturation Ratios 11 (L) 20 - 55 %   UIBC 143 125 - 400 ug/dL    Comment: Performed at [facility]  Ferritin     Status: Abnormal   Collection Time: 10/06/14  4:45 AM  Result Value Ref Range   Ferritin 377 (H) 22 - 322 ng/mL    Comment: (NOTE) Result confirmed by automatic dilution. Result repeated and verified. Performed at [facility]   Vitamin B12     Status: None   Collection Time: 10/06/14  4:45 AM  Result Value Ref Range   Vitamin B-12 613 211 - 911 pg/mL    Comment: Performed at [facility]  Glucose, capillary     Status: Abnormal   Collection Time: 10/06/14  7:34 AM  Result Value Ref Range   Glucose-Capillary 102 (H) 70 - 99 mg/dL   Comment 1 Notify RN    Comment 2 Document in Chart   Surgical pcr screen     Status: None   Collection Time: 10/06/14  8:06 AM  Result Value Ref Range   MRSA, PCR NEGATIVE NEGATIVE   Staphylococcus aureus NEGATIVE NEGATIVE    Comment:        The Xpert SA Assay (FDA approved for NASAL specimens in patients over 22 years of age), is one component of a comprehensive surveillance program.  Test performance has been validated by [facility] for patients greater than or equal to 68 year old. It is not intended to diagnose infection nor to guide or monitor treatment.    Ct Abdomen Pelvis Wo Contrast  10/05/2014   CLINICAL DATA:  Upper abdominal pain since last Thursday. Nausea and vomiting.  EXAM: CT ABDOMEN AND PELVIS WITHOUT CONTRAST  TECHNIQUE: Multidetector CT imaging of the abdomen and pelvis was performed following the standard protocol without IV contrast.  COMPARISON:  10/01/2014  FINDINGS: There is bibasilar atelectasis.  There is a punctate nonobstructing left renal calculus. No obstructive uropathy. No perinephric stranding is seen. The kidneys are symmetric in size without evidence for exophytic mass. The bladder is unremarkable.  The liver demonstrates no  focal abnormality. The gallbladder is distended with cholelithiasis, gallbladder wall thickening and pericholecystic inflammatory changes most consistent with acute cholecystitis. The spleen demonstrates no focal abnormality. The adrenal glands and pancreas are normal.  There is a small hiatal hernia. The unopacified stomach, duodenum, small intestine and large intestine are unremarkable, but evaluation is limited by lack of oral contrast. There is no pneumoperitoneum, pneumatosis, or portal venous gas. There is no abdominal or pelvic free fluid. There is no lymphadenopathy.  The abdominal aorta is normal in caliber with atherosclerosis.  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 There is thoracolumbar spine spondylosis most severe at L3-4 and L4-5 with disc space narrowing and bilateral severe facet arthropathy.  IMPRESSION: 1. Cholelithiasis with gallbladder distention, gallbladder wall thickening and pericholecystic inflammatory changes most consistent with acute cholecystitis.   Electronically Signed   By: Kathreen Devoid   On: 10/05/2014 19:08   US Abdomen Complete  10/05/2014   CLINICAL DATA:  Acute onset of right upper quadrant pain 6 days ago. Symptoms are worsening.  EXAM: ULTRASOUND ABDOMEN COMPLETE  COMPARISON:  CT abdomen same day.  FINDINGS: Gallbladder: Gallstones dependent in the gallbladder. Mild wall thickening. Positive Murphy sign. Small amount of adjacent fluid. Findings all consistent with acute cholecystitis.  Common bile duct: Diameter: 4 mm, normal  Liver: Diffuse fatty change.  No focal lesion seen.  IVC: No abnormality visualized.  Pancreas: Poorly seen because of overlying bowel gas.  Spleen: Poorly seen because of overlying bowel gas.  Right Kidney: Length: 11.0 cm. Echogenicity within normal limits. No mass or hydronephrosis visualized.  Left Kidney: Length: 10.9 cm. Echogenicity within normal limits. No mass or hydronephrosis visualized.  Abdominal aorta: No aneurysm visualized.  Other  findings: None.  IMPRESSION: Consistent with acute cholecystitis. Gallstones. Wall thickening. Small amount appear cholecystic fluid. Positive Murphy sign.   Electronically Signed   By: Nelson Chimes M.D.   On: 10/05/2014 19:48       Assessment/Plan Acute cholecystitis with cholelithiasis Given the patient's current state of pain and the duration for which he has had symptoms, we are going to make the decision to place a percutaneous cholecystostomy drain.  The patient would be higher risk for an open procedure at this time along with possible complications from the general anesthetic.  The patient had an MI 2 years ago with what sounds like placement of DES, but never had follow up.  He has not had an ECHO or any cardiac evaluation.  It is not safe, prior to a cardiac evaluation, especially given his new a fib a couple days ago (which may be stress induced from his gallbladder), to put him to sleep without knowing his cardiac function or any other information.  His creatinine is also elevated.  This is likely due to his infection and dehydration; however, he is also hypotensive and anesthesia will continue to lower this, which puts his kidneys at higher risk for further injury.  Because of these reasons, after my evaluation and evaluation with Dr. Redmond Pulling, we feel the safest way to proceed and not delay treatment for this patient is to place a perc chole drain.  This has been thoroughly d/w the patient, his wife, and daughter.  They all understand and agree.  All questions has been answered.  We do recommend a nonurgent cardiac evaluation as the patient will need an operation in the future for his gallbladder, in addition to any work up that may or may not need to be done with his history and recent evidence of a fib. -can likely have at least some sips of liquids after procedure for perc drain.  Would not advance diet quickly at all right now. CAD/MI -see above discussion, per primary service ARF -likely  secondary to infection and dehydration.  Hopefully will respond to drain and IVFs.  Defer to primary service -may need to adjust zosyn dosage to a renal dose, defer to pharmacy Hypotension -secondary to acute cholecystitis H/X of HTN -per primary DM -per primary  Nazifa Trinka E 10/06/2014, 11:10 AM Pager: [phone]

## 2014-10-06 NOTE — Progress Notes (Signed)
Received from radiology  On non rebreather mask. Radiology reports patient has been having frequent pvc and oxygen sats dropping during procedure. Dr. Wyonia HoughGerghe notified.

## 2014-10-06 NOTE — Consult Note (Addendum)
Admit date: 10/05/2014 Referring Physician  Dr. Elvera Lennox Primary Physician  None Primary Cardiologist  [facility] - has not seen cardiology since his MI 2 years ago Reason for Consultation  Preoperative clearance for cholecystectomy  HPI: Larry [name] is a 69 y.o. Male with a history of CAD with MI and PCI 2 years ago at [facility] and has not followed up with Cardiology who presented to [facility] with abdominal pain. Patient states that he went to the [place] recently and his medication were changed around. Patient states mostly this was his diabetes medications. He states he started throwing up around Thursday last week. He states that vomiting was intractable. He states that he though his abdominal pain was related to his throwing up. He states that the pain is located on the RUQ.  In the ED he had an ultrasound done and this shows presence of cholecystitis and also has some gallstones. Of note he was in the ER 4/15 for similar symptoms and was diagnosed with gastritis.  He was also noted to be in atrial fibrillation at that time and was started on Eliquis and sent home but never got it filled.  Cardiology is now asked to consult for preoperative clearance.  He denies any recent history of chest pain or pressure and no SOB.  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  He says that he thinks he can walk 4 blocks without any SOB or CP.  I do not have the records from his cath or old EKGs to compare at this time.      PMH:   Past Medical History  Diagnosis Date  . Diabetes mellitus without complication   . MI (myocardial infarction)   . Hypertension   . Atrial fibrillation 10-01-14  . Coronary artery disease   . Obesity      PSH:   Past Surgical History  Procedure Laterality Date  . Revision total hip arthroplasty    . Knee replacement    . Cardiac stent    . Cardiac catheterization      with stent placement in 2014  . Ankle surgery Left     Allergies:   Review of patient's allergies indicates no known allergies. Prior to Admit Meds:   Prescriptions prior to admission  Medication Sig Dispense Refill Last Dose  . acetaminophen-codeine (TYLENOL #3) 300-30 MG per tablet Take 1 tablet by mouth 2 (two) times daily.   Past Week at Unknown time  . allopurinol (ZYLOPRIM) 300 MG tablet Take 300 mg by mouth daily.   Past Week at Unknown time  . amLODipine (NORVASC) 10 MG tablet Take 10 mg by mouth daily.   Past Week at Unknown time  . apixaban (ELIQUIS) 5 MG TABS tablet Take 1 tablet (5 mg total) by mouth 2 (two) times daily. 60 tablet 0 Past Week at Unknown time  . aspirin 81 MG chewable tablet Chew 81 mg by mouth daily.   10/05/2014 at Unknown time  . atorvastatin (LIPITOR) 80 MG tablet Take 80 mg by mouth daily.   Past Week at Unknown time  . Cholecalciferol (VITAMIN D PO) Take 1 tablet by mouth daily.    Past Week at Unknown time  . citalopram (CELEXA) 40 MG tablet Take 40 mg by mouth daily.   10/05/2014 at Unknown time  . Cyanocobalamin (VITAMIN B 12 PO) Take 1 tablet by mouth daily.    Past Week at Unknown time  . furosemide (  LASIX) 40 MG tablet Take 40 mg by mouth daily.    Past Week at Unknown time  . insulin glargine (LANTUS) 100 UNIT/ML injection Inject 40 Units into the skin at bedtime.   Past Week at Unknown time  . Liraglutide 18 MG/3ML SOPN Inject 1.2 mg into the skin daily.   Past Week at Unknown time  . lisinopril (PRINIVIL,ZESTRIL) 40 MG tablet Take 40 mg by mouth daily.   Past Week at Unknown time  . magnesium oxide (MAG-OX) 400 MG tablet Take 400 mg by mouth daily.   Past Week at Unknown time  . metFORMIN (GLUCOPHAGE) 500 MG tablet Take 500 mg by mouth 2 (two) times daily with a meal.   Past Week at Unknown time  . pantoprazole (PROTONIX) 40 MG tablet Take 40 mg by mouth daily.   Past Week at Unknown time  . sucralfate (CARAFATE) 1 GM/10ML suspension Take 10 mLs (1 g total) by mouth 4 (four) times daily -  with meals and at bedtime. 420  mL 0 Past Month at Unknown time   Fam HX:   History reviewed. No pertinent family history. Social HX:    History   Social History  . Marital Status: Married    Spouse Name: N/A  . Number of Children: N/A  . Years of Education: N/A   Occupational History  . Not on file.   Social History Main Topics  . Smoking status: Never Smoker   . Smokeless tobacco: Not on file  . Alcohol Use: No  . Drug Use: No  . Sexual Activity: Not on file   Other Topics Concern  . Not on file   Social History Narrative     ROS:  All 11 ROS were addressed and are negative except what is stated in the HPI  Physical Exam: Blood pressure 97/49, pulse 88, temperature 99.2 F (37.3 C), temperature source Oral, resp. rate 18, height 5\' 7"  (1.702 m), weight 260 lb (117.935 kg), SpO2 96 %.    General: Well developed, well nourished, in no acute distress Head: Eyes PERRLA, No xanthomas.   Normal cephalic and atramatic  Lungs:   Clear bilaterally to auscultation and percussion. Heart:   HRRR S1 S2 Pulses are 2+ & equal.            No carotid bruit. No JVD.  No abdominal bruits. No femoral bruits. Abdomen: Bowel sounds are positive, abdomen soft and non-tender without masses Extremities:   No clubbing, cyanosis or edema.  DP +1 Neuro: Alert and oriented X 3. Psych:  Good affect, responds appropriately    Labs:   Lab Results  Component Value Date   WBC 10.5 10/06/2014   HGB 9.5* 10/06/2014   HCT 27.8* 10/06/2014   MCV 90.8 10/06/2014   PLT 167 10/06/2014    Recent Labs Lab 10/06/14 0445  NA 137  K 3.5  CL 103  CO2 24  BUN 52*  CREATININE 2.30*  CALCIUM 8.0*  PROT 6.0  BILITOT 2.5*  ALKPHOS 74  ALT 26  AST 34  GLUCOSE 120*   No results found for: PTT Lab Results  Component Value Date   INR 1.07 10/06/2014   Lab Results  Component Value Date   TROPONINI <0.03 10/01/2014    No results found for: CHOL No results found for: HDL No results found for: LDLCALC No results found  for: TRIG No results found for: CHOLHDL No results found for: LDLDIRECT    Radiology:  Ct Abdomen Pelvis  Wo Contrast  10/05/2014   CLINICAL DATA:  Upper abdominal pain since last Thursday. Nausea and vomiting.  EXAM: CT ABDOMEN AND PELVIS WITHOUT CONTRAST  TECHNIQUE: Multidetector CT imaging of the abdomen and pelvis was performed following the standard protocol without IV contrast.  COMPARISON:  10/01/2014  FINDINGS: There is bibasilar atelectasis.  There is a punctate nonobstructing left renal calculus. No obstructive uropathy. No perinephric stranding is seen. The kidneys are symmetric in size without evidence for exophytic mass. The bladder is unremarkable.  The liver demonstrates no focal abnormality. The gallbladder is distended with cholelithiasis, gallbladder wall thickening and pericholecystic inflammatory changes most consistent with acute cholecystitis. The spleen demonstrates no focal abnormality. The adrenal glands and pancreas are normal.  There is a small hiatal hernia. The unopacified stomach, duodenum, small intestine and large intestine are unremarkable, but evaluation is limited by lack of oral contrast. There is no pneumoperitoneum, pneumatosis, or portal venous gas. There is no abdominal or pelvic free fluid. There is no lymphadenopathy.  The abdominal aorta is normal in caliber with atherosclerosis.  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 There is thoracolumbar spine spondylosis most severe at L3-4 and L4-5 with disc space narrowing and bilateral severe facet arthropathy.  IMPRESSION: 1. Cholelithiasis with gallbladder distention, gallbladder wall thickening and pericholecystic inflammatory changes most consistent with acute cholecystitis.   Electronically Signed   By: Elige KoHetal  Patel   On: 10/05/2014 19:08   [place] Abdomen Complete  10/05/2014   CLINICAL DATA:  Acute onset of right upper quadrant pain 6 days ago. Symptoms are worsening.  EXAM: ULTRASOUND ABDOMEN COMPLETE  COMPARISON:  CT  abdomen same day.  FINDINGS: Gallbladder: Gallstones dependent in the gallbladder. Mild wall thickening. Positive Murphy sign. Small amount of adjacent fluid. Findings all consistent with acute cholecystitis.  Common bile duct: Diameter: 4 mm, normal  Liver: Diffuse fatty change.  No focal lesion seen.  IVC: No abnormality visualized.  Pancreas: Poorly seen because of overlying bowel gas.  Spleen: Poorly seen because of overlying bowel gas.  Right Kidney: Length: 11.0 cm. Echogenicity within normal limits. No mass or hydronephrosis visualized.  Left Kidney: Length: 10.9 cm. Echogenicity within normal limits. No mass or hydronephrosis visualized.  Abdominal aorta: No aneurysm visualized.  Other findings: None.  IMPRESSION: Consistent with acute cholecystitis. Gallstones. Wall thickening. Small amount appear cholecystic fluid. Positive Murphy sign.   Electronically Signed   By: Paulina FusiMark  Shogry M.D.   On: 10/05/2014 19:48    EKG:  NSR with LVH by repol and QRS widening and inferior infarct.    ASSESSMENT/PLAN:  1.  Acute cholecystitis 2.  Preoperative cardiac clearance for cholycystectomy.  He appears stable from a cardiac standpoint.  He has not had any recent angina or SOB.  EKG shows inferior infarct with no acute ST changes.  I do not have an old EKG after his Cath 2 years ago to compare to.  I will try to get a copy of his EKG and cath report [facility].  He thinks he is able to walk 4 blocks without any anginal symptoms.  He is moderate risk for surgery due to  History of CAD but is not having any active exertional anginal symptoms.  I will get a 2D echo to assess LVF. 3.  PAF maintaining NSR.  He never got the Eliquis filled.  Recommend restarting Eliquis after surgery.  This patients CHA2DS2-VASc Score and unadjusted Ischemic Stroke Rate (% per year) is equal to 4 % stroke rate/year  from a score of 4 Above score calculated as 1 point each if present [CHF, HTN, DM, Vascular=MI/PAD/Aortic Plaque, Age  if 65-74, or Male] Above score calculated as 2 points each if present [Age > 75, or Stroke/TIA/TE] 4.  HTN - now hypotensive most likely due to underlying infection.  ACE I, diuretic and amlodipine on hold 5.  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  Continue ASA. 6.  DM 7.  Dyslipidemia on high dose statin.   Quintella Reichert, MD  10/06/2014  2:59 PM

## 2014-10-06 NOTE — Progress Notes (Signed)
ANTIBIOTIC CONSULT NOTE - INITIAL  Pharmacy Consult for zosyn Indication: Acute Cholecystitis with gallstones  No Known Allergies  Patient Measurements: Height: 5\' 7"  (170.2 cm) Weight: 260 lb (117.935 kg) IBW/kg (Calculated) : 66.1 Adjusted Body Weight:    Vital Signs: Temp: 98.6 F (37 C) (04/19 2320) Temp Source: Oral (04/19 2320) BP: 96/48 mmHg (04/19 2320) Pulse Rate: 94 (04/19 2320) Intake/Output from previous day: 04/19 0701 - 04/20 0700 In: -  Out: 700 [Urine:700] Intake/Output from this shift: Total I/O In: -  Out: 700 [Urine:700]  Labs:  Recent Labs  10/05/14 1640  WBC 11.7*  HGB 10.6*  PLT 174  CREATININE 2.33*   Estimated Creatinine Clearance: 37.3 mL/min (by C-G formula based on Cr of 2.33). No results for input(s): VANCOTROUGH, VANCOPEAK, VANCORANDOM, GENTTROUGH, GENTPEAK, GENTRANDOM, TOBRATROUGH, TOBRAPEAK, TOBRARND, AMIKACINPEAK, AMIKACINTROU, AMIKACIN in the last 72 hours.   Microbiology: No results found for this or any previous visit (from the past 720 hour(s)).  Medical History: Past Medical History  Diagnosis Date  . Diabetes mellitus without complication   . MI (myocardial infarction)   . Hypertension     Medications:  Anti-infectives    Start     Dose/Rate Route Frequency Ordered Stop   10/06/14 0130  piperacillin-tazobactam (ZOSYN) IVPB 3.375 g     3.375 g 12.5 mL/hr over 240 Minutes Intravenous 3 times per day 10/06/14 0128     10/05/14 2100  piperacillin-tazobactam (ZOSYN) IVPB 3.375 g     3.375 g 100 mL/hr over 30 Minutes Intravenous  Once 10/05/14 2049 10/05/14 2143     Assessment: Patient with Acute Cholecystitis with gallstones.  First dose of antibiotics already given.  Goal of Therapy:  Zosyn based on renal function   Plan:  Follow up culture results  Zosyn 3.375g IV Q8H infused over 4hrs.   Darlina GuysGrimsley Jr, Jacquenette ShoneJulian Crowford 10/06/2014,4:50 AM

## 2014-10-06 NOTE — Consult Note (Signed)
PULMONARY / CRITICAL CARE MEDICINE   Name: Donneta RombergKenneth R Rosser MRN: [id number] DOB: June 20, 1945    ADMISSION DATE:  10/05/2014 CONSULTATION DATE:  10/06/2014  REFERRING MD :  Dr. Elvera LennoxGherghe  CHIEF COMPLAINT:  Abd pain  INITIAL PRESENTATION: 69 year old male admitted for acute cholecystitis 4/19. Perc chole drain placed 4/20. Hypotensive post-procedurally to ICU for pressors. PCCM consult.   STUDIES:  RUQ US 4/20 > Consistent with acute cholecystitis. Gallstones. Wall thickening. Small amount appear cholecystic fluid. Positive Murphy sign.  SIGNIFICANT EVENTS: 4/19 admitted for acute cholecystitis 4/20 Perc chole drain placed, hypotensive, to ICU for pressors  HISTORY OF PRESENT ILLNESS: 69 year old male with PMH as below, which includes MI, HTN, DM, and Atrial Fib on eliquis. He was seen in ED 4/15 with LUQ pain and was discharged with presumed gastritis. [facility] ED [facility]19 late PM with complaints of RUQ abdominal pain and intractable vomiting. RUQ ultrasound in ED found cholecystitis and gallstones. He was admitted to hospitalist team. He was evaluated by cardiology who reported a moderate operative risk, and surgery, who preferred insertion of a  Perc chole drain as opposed to cholecystectomy at this time due to unknown cardiacstatus and eliquis. This was performed in IR 4/20. Upon returning to floor the patient was hypotensive and placed on levophed gtt. PCCM consulted to assist with ICU level medical management.   PAST MEDICAL HISTORY :   has a past medical history of Diabetes mellitus without complication; MI (myocardial infarction); Hypertension; Atrial fibrillation (10-01-14); Coronary artery disease; and Obesity.  has past surgical history that includes Revision total hip arthroplasty; knee replacement; cardiac stent; Cardiac catheterization; and Ankle surgery (Left). Prior to Admission medications   Medication Sig Start Date End Date Taking? Authorizing Provider   acetaminophen-codeine (TYLENOL #3) 300-30 MG per tablet Take 1 tablet by mouth 2 (two) times daily.   Yes Historical Provider, MD  allopurinol (ZYLOPRIM) 300 MG tablet Take 300 mg by mouth daily.   Yes Historical Provider, MD  amLODipine (NORVASC) 10 MG tablet Take 10 mg by mouth daily.   Yes Historical Provider, MD  apixaban (ELIQUIS) 5 MG TABS tablet Take 1 tablet (5 mg total) by mouth 2 (two) times daily. 10/01/14  Yes April Palumbo, MD  aspirin 81 MG chewable tablet Chew 81 mg by mouth daily.   Yes Historical Provider, MD  atorvastatin (LIPITOR) 80 MG tablet Take 80 mg by mouth daily.   Yes Historical Provider, MD  Cholecalciferol (VITAMIN D PO) Take 1 tablet by mouth daily.    Yes Historical Provider, MD  citalopram (CELEXA) 40 MG tablet Take 40 mg by mouth daily.   Yes Historical Provider, MD  Cyanocobalamin (VITAMIN B 12 PO) Take 1 tablet by mouth daily.    Yes Historical Provider, MD  furosemide (LASIX) 40 MG tablet Take 40 mg by mouth daily.    Yes Historical Provider, MD  insulin glargine (LANTUS) 100 UNIT/ML injection Inject 40 Units into the skin at bedtime.   Yes Historical Provider, MD  Liraglutide 18 MG/3ML SOPN Inject 1.2 mg into the skin daily.   Yes Historical Provider, MD  lisinopril (PRINIVIL,ZESTRIL) 40 MG tablet Take 40 mg by mouth daily.   Yes Historical Provider, MD  magnesium oxide (MAG-OX) 400 MG tablet Take 400 mg by mouth daily.   Yes Historical Provider, MD  metFORMIN (GLUCOPHAGE) 500 MG tablet Take 500 mg by mouth 2 (two) times daily with a meal.   Yes Historical Provider, MD  pantoprazole (PROTONIX) 40 MG tablet Take 40 mg by mouth daily.   Yes Historical Provider, MD  sucralfate (CARAFATE) 1 GM/10ML suspension Take 10 mLs (1 g total) by mouth 4 (four) times daily -  with meals and at bedtime. 10/01/14  Yes April Palumbo, MD   No Known Allergies  FAMILY HISTORY:  has no family status information on file.  SOCIAL HISTORY:  reports that he has never smoked. He does  not have any smokeless tobacco history on file. He reports that he does not drink alcohol or use illicit drugs.  REVIEW OF SYSTEMS:   Bolds are positive  Constitutional: weight loss, gain, night sweats, Fevers, chills, fatigue .  HEENT: headaches, Sore throat, sneezing, nasal congestion, post nasal drip, Difficulty swallowing, Tooth/dental problems, visual complaints visual changes, ear ache CV:  chest pain, radiates: ,Orthopnea, PND, swelling in lower extremities, dizziness, palpitations, syncope.  GI  heartburn, indigestion, abdominal pain, nausea, vomiting, diarrhea, change in bowel habits, loss of appetite, bloody stools.  Resp: cough, productive: , hemoptysis, dyspnea, chest pain, pleuritic.  Skin: rash or itching or icterus GU: dysuria, change in color of urine, urgency or frequency. flank pain, hematuria  MS: joint pain or swelling. decreased range of motion  Psych: change in mood or affect. depression or anxiety.  Neuro: difficulty with speech, weakness, numbness, ataxia    SUBJECTIVE:   VITAL SIGNS: Temp:  [98.1 F (36.7 C)-99.7 F (37.6 C)] 99.7 F (37.6 C) (04/20 1900) Pulse Rate:  [70-100] 91 (04/20 2045) Resp:  [14-28] 19 (04/20 2045) BP: (71-123)/(30-91) 100/44 mmHg (04/20 2045) SpO2:  [90 %-100 %] 96 % (04/20 2045) FiO2 (%):  [55 %] 55 % (04/20 2014) Weight:  [117.2 kg (258 lb 6.1 oz)] 117.2 kg (258 lb 6.1 oz) (04/20 1900) HEMODYNAMICS:   VENTILATOR SETTINGS: Vent Mode:  [-]  FiO2 (%):  [55 %] 55 % INTAKE / OUTPUT:  Intake/Output Summary (Last 24 hours) at 10/06/14 2057 Last data filed at 10/06/14 1400  Gross per 24 hour  Intake 264.17 ml  Output   1100 ml  Net -835.83 ml    PHYSICAL EXAMINATION: General:  Obese male in NAD Neuro:  Somnolent  HEENT:  [place]/AT, no JVD, PERRL Cardiovascular:  RRR, no MRG Lungs:  Distant breath sounds Abdomen:  Soft, tender, non-distended. RUQ drain in place.  Musculoskeletal:  No acute deformity Skin:  Grossly  intact  LABS:  CBC  Recent Labs Lab 10/01/14 0115 10/05/14 1640 10/06/14 0445  WBC 15.2* 11.7* 10.5  HGB 13.3 10.6* 9.5*  HCT 39.9 30.3* 27.8*  PLT 250 174 167   Coag's  Recent Labs Lab 10/06/14 0445  APTT 34  INR 1.07   BMET  Recent Labs Lab 10/01/14 0115 10/01/14 0142 10/05/14 1640 10/06/14 0445  NA 140 142 133* 137  K 3.8 3.3* 3.8 3.5  CL 99 105 98 103  CO2 27  --  25 24  BUN 24* 20 57* 52*  CREATININE 1.42* 1.20 2.33* 2.30*  GLUCOSE 240* 221* 198* 120*   Electrolytes  Recent Labs Lab 10/01/14 0115 10/05/14 1640 10/06/14 0445  CALCIUM 10.3 7.9* 8.0*   Sepsis Markers  Recent Labs Lab 10/01/14 0135 10/01/14 0427 10/05/14 1739  LATICACIDVEN 2.12* 1.39 1.63   ABG No results for input(s): PHART, PCO2ART, PO2ART in the last 168 hours. Liver Enzymes  Recent Labs Lab 10/01/14 0115 10/05/14 1640 10/06/14 0445  AST 28 34 34  ALT 19 25 26   ALKPHOS 87 76 74  BILITOT 2.3*  2.8* 2.5*  ALBUMIN 4.5 3.0* 2.7*   Cardiac Enzymes  Recent Labs Lab 10/01/14 0115 10/06/14 1957  TROPONINI <0.03 0.05*   Glucose  Recent Labs Lab 10/01/14 0116 10/06/14 0128 10/06/14 0337 10/06/14 0734 10/06/14 1209 10/06/14 1642  GLUCAP 195* 149* 125* 102* 147* 191*    Imaging Ct Abdomen Pelvis Wo Contrast  10/05/2014   CLINICAL DATA:  Upper abdominal pain since last Thursday. Nausea and vomiting.  EXAM: CT ABDOMEN AND PELVIS WITHOUT CONTRAST  TECHNIQUE: Multidetector CT imaging of the abdomen and pelvis was performed following the standard protocol without IV contrast.  COMPARISON:  10/01/2014  FINDINGS: There is bibasilar atelectasis.  There is a punctate nonobstructing left renal calculus. No obstructive uropathy. No perinephric stranding is seen. The kidneys are symmetric in size without evidence for exophytic mass. The bladder is unremarkable.  The liver demonstrates no focal abnormality. The gallbladder is distended with cholelithiasis, gallbladder wall  thickening and pericholecystic inflammatory changes most consistent with acute cholecystitis. The spleen demonstrates no focal abnormality. The adrenal glands and pancreas are normal.  There is a small hiatal hernia. The unopacified stomach, duodenum, small intestine and large intestine are unremarkable, but evaluation is limited by lack of oral contrast. There is no pneumoperitoneum, pneumatosis, or portal venous gas. There is no abdominal or pelvic free fluid. There is no lymphadenopathy.  The abdominal aorta is normal in caliber with atherosclerosis.  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 There is thoracolumbar spine spondylosis most severe at L3-4 and L4-5 with disc space narrowing and bilateral severe facet arthropathy.  IMPRESSION: 1. Cholelithiasis with gallbladder distention, gallbladder wall thickening and pericholecystic inflammatory changes most consistent with acute cholecystitis.   Electronically Signed   By: Elige Ko   On: 10/05/2014 19:08   US Abdomen Complete  10/05/2014   CLINICAL DATA:  Acute onset of right upper quadrant pain 6 days ago. Symptoms are worsening.  EXAM: ULTRASOUND ABDOMEN COMPLETE  COMPARISON:  CT abdomen same day.  FINDINGS: Gallbladder: Gallstones dependent in the gallbladder. Mild wall thickening. Positive Murphy sign. Small amount of adjacent fluid. Findings all consistent with acute cholecystitis.  Common bile duct: Diameter: 4 mm, normal  Liver: Diffuse fatty change.  No focal lesion seen.  IVC: No abnormality visualized.  Pancreas: Poorly seen because of overlying bowel gas.  Spleen: Poorly seen because of overlying bowel gas.  Right Kidney: Length: 11.0 cm. Echogenicity within normal limits. No mass or hydronephrosis visualized.  Left Kidney: Length: 10.9 cm. Echogenicity within normal limits. No mass or hydronephrosis visualized.  Abdominal aorta: No aneurysm visualized.  Other findings: None.  IMPRESSION: Consistent with acute cholecystitis. Gallstones. Wall  thickening. Small amount appear cholecystic fluid. Positive Murphy sign.   Electronically Signed   By: Paulina Fusi M.D.   On: 10/05/2014 19:48     ASSESSMENT / PLAN:  PULMONARY A: Acute hypoxemic respiratory failure  P:   Supplemental O2 as needed to maintain SpO2 > 92%  CARDIOVASCULAR CVL 4/20 > A:  Septic shock H/o PAF on eliquis (now in NSR) H/o CAD  P:  Volume resuscitation MAP goal > 46mm/Hg Levophed for MAP goal Place CVL CVP monitoring Check lactic  RENAL A:   AKI suspect prerenal etiology  P:   Give volume Follow Bmet  GASTROINTESTINAL A:   Acute cholecystitis s/p perc chole drain  P:   NPO SUP: IV protonix See ID section Monitor drain output Will need cholecystectomy in 6-8 weeks per surgery.  HEMATOLOGIC A:   Anemia  P:  Follow CBC Transfuse per ICU guidelines Resume VTE chemoprophylaxis 4/21  INFECTIOUS A:   Acute cholecystitis  P:   Body fluid culture 4/20 >> Abx: pip/tazo, start date 4/20 Follow WBC and fever curve  ENDOCRINE A:   DM2    P:   CBG monitoring and SSI  NEUROLOGIC A:   Acute metabolic encephalopathy Pain management  P:   RASS goal: 0 Dilaudid PRN   FAMILY  - Updates:   - Inter-disciplinary family meet or Palliative Care meeting due by:  4/27   Joneen Roach, AGACNP-BC [place] [facility] Pager [phone] or [phone]  10/06/2014 9:28 PM

## 2014-10-06 NOTE — Progress Notes (Addendum)
PROGRESS NOTE  Larry RombergKenneth R [name] ZOX:[id number] DOB: 07-Jul-1945 DOA: 10/05/2014 PCP: Pcp Not In System  HPI: Larry [name] is a 69 y.o. male presents with abdominal pain, found to have acute cholecystitis on imaging.   Subjective / 24 H Interval events - patient with abdominal pain and nausea this morning, minimally improved but still persistent. - he is thirsty  - denies chest pain/palpitations/dyspnea  Assessment/Plan: Principal Problem:   Cholecystitis Active Problems:   Diabetes mellitus without complication   HTN (hypertension)   Hypotension   Anemia   CAD (coronary artery disease)   Acute cholecystitis with cholelithiasis - US positive for cholecystitis, general surgery consulted and following - ?lap shole vs perc drain, given CAD and A fib, surgery asking for formal cardiology evaluation, will consult.  - continue Zosyn  CAD s/p MI about 2 years ago - was on Plavix for a year, has never followed up with cardiology since  A fib - this was seen initially on 4/15 when he presented to the ED, started on Eliquis however patient has not started it yet - currently he is in sinus rhythm, will obtain 2D echo - cards consult as above - hold A/C for now  DM - SSI, A1C pending  Anemia - unknown baseline, stable  AKI  - unknown baseline, likely worsening renal function in the setting of dehydration due to poor po intake - had contrasted CT for PE on 4/15 so possible contrast induced as well - long standing DM can contribute - IVF and repeat BMP in am     Addendum 4:40 pm. Called by RN that on returning from IR patient dyspneic and on NRB, as well as with multiple noted PVCs/ectopy on the monitor. Patient seen and evaluated, apparently had a syncopal episode in the room just prior to going to IR. He is breathing comfortable now, no chest pain. EKG obtained and reviewed bedside, sinus rhythm with few PVCs. Will obtain stat CXR, borderling hypotensive, will transfer to SDU  and closely monitor overnight.     Diet: Diet NPO time specified Fluids: NS DVT Prophylaxis: SCD  Code Status: Full Code Family Communication: d/w wife and daughter bedside  Disposition Plan: home when ready, ~3-4 days  Consultants:  General surgery   Cardiology   Procedures:  None    Antibiotics Zosyn 4/19 >>   Studies  1. Ct Abdomen Pelvis Wo Contrast 10/05/2014 1. Cholelithiasis with gallbladder distention, gallbladder wall thickening and pericholecystic inflammatory changes most consistent with acute cholecystitis.   2. [place] Abdomen Complete 10/05/2014 Consistent with acute cholecystitis. Gallstones. Wall thickening. Small amount appear cholecystic fluid. Positive Murphy sign.     Objective  Filed Vitals:   10/05/14 2215 10/05/14 2320 10/06/14 0540 10/06/14 1019  BP: 110/57 96/48 91/47  97/49  Pulse: 91 94 90 88  Temp:  98.6 F (37 C) 98.1 F (36.7 C) 99.2 F (37.3 C)  TempSrc:  Oral Oral Oral  Resp:    18  Height:      Weight:      SpO2: 97% 98% 90% 96%    Intake/Output Summary (Last 24 hours) at 10/06/14 1121 Last data filed at 10/06/14 0600  Gross per 24 hour  Intake 264.17 ml  Output    700 ml  Net -435.83 ml   Filed Weights   10/05/14 1613  Weight: 117.935 kg (260 lb)    Exam:  General:  NAD  HEENT: no scleral icterus noticed,   Cardiovascular: RRR without MRG, 2+ peripheral  pulses, trace LE edema  Respiratory: CTA biL, good air movement, no wheezing, no crackles, no rales  Abdomen: soft, tender to palpation RUQ, BS +  MSK/Extremities: no clubbing/cyanosis, no joint swelling  Skin: no rashes  Neuro: non focal  Data Reviewed: Basic Metabolic Panel:  Recent Labs Lab 10/01/14 0115 10/01/14 0142 10/05/14 1640 10/06/14 0445  NA 140 142 133* 137  K 3.8 3.3* 3.8 3.5  CL 99 105 98 103  CO2 27  --  25 24  GLUCOSE 240* 221* 198* 120*  BUN 24* 20 57* 52*  CREATININE 1.42* 1.20 2.33* 2.30*  CALCIUM 10.3  --  7.9* 8.0*   Liver  Function Tests:  Recent Labs Lab 10/01/14 0115 10/05/14 1640 10/06/14 0445  AST 28 34 34  ALT ALKPHOS 87 76 74  BILITOT 2.3* 2.8* 2.5*  PROT 7.7 6.6 6.0  ALBUMIN 4.5 3.0* 2.7*    Recent Labs Lab 10/01/14 0115 10/05/14 1640  LIPASE 36 25   CBC:  Recent Labs Lab 10/01/14 0115 10/05/14 1640 10/06/14 0445  WBC 15.2* 11.7* 10.5  NEUTROABS 12.4* 9.7*  --   HGB 13.3 10.6* 9.5*  HCT 39.9 30.3* 27.8*  MCV 92.4 89.1 90.8  PLT 250 174 167   Cardiac Enzymes:  Recent Labs Lab 10/01/14 0115  TROPONINI <0.03   CBG:  Recent Labs Lab 10/01/14 0116 10/06/14 0128 10/06/14 0734  GLUCAP 195* 149* 102*    Recent Results (from the past 240 hour(s))  Surgical pcr screen     Status: None   Collection Time: 10/06/14  8:06 AM  Result Value Ref Range Status   MRSA, PCR NEGATIVE NEGATIVE Final   Staphylococcus aureus NEGATIVE NEGATIVE Final    Comment:        The Xpert SA Assay (FDA approved for NASAL specimens in patients over 16 years of age), is one component of a comprehensive surveillance program.  Test performance has been validated by [facility] for patients greater than or equal to 20 year old. It is not intended to diagnose infection nor to guide or monitor treatment.      Scheduled Meds: . allopurinol  300 mg Oral Daily  . antiseptic oral rinse  7 mL Mouth Rinse q12n4p  . antiseptic oral rinse  7 mL Mouth Rinse q12n4p  . atorvastatin  80 mg Oral q1800  . chlorhexidine  15 mL Mouth Rinse BID  . chlorhexidine  15 mL Mouth Rinse BID  . citalopram  40 mg Oral Daily  . folic acid  1 mg Oral Daily  . insulin aspart  0-20 Units Subcutaneous 6 times per day  . multivitamin with minerals  1 tablet Oral Daily  . pantoprazole  40 mg Oral Daily  . piperacillin-tazobactam (ZOSYN)  IV  3.375 g Intravenous 3 times per day  . sucralfate  1 g Oral TID WC & HS  . thiamine  100 mg Oral Daily   Continuous Infusions: . sodium chloride 50 mL/hr (10/06/14  0030)    Pamella Pert, MD [facility] Pager [phone]. If 7 PM - 7 AM, please contact night-coverage at [email or web address], password [email or web address] 10/06/2014, 11:21 AM  LOS: 1 day

## 2014-10-06 NOTE — Progress Notes (Signed)
Larry [name] [name] from radiology to get pt for drain placement.  Larry called nurse in room and stated that pt had lost consciousness for a brief moment.  When nurse arrived to room pt was in bed talking with his wife.  Pt had just returned from bathroom and had voided and had small bowel movement.  Vital signs taken and recorded in pt chart.  Pt stated he was feeling hot but otherwise fine. Pt's nurse aware of event.  Loraine LericheMark took pt down to radiology.

## 2014-10-06 NOTE — Care Management Note (Signed)
    Page 1 of 1   10/06/2014     11:22:33 AM CARE MANAGEMENT NOTE 10/06/2014  Patient:  Larry RombergNGEL,Larry R   Account Number:  [id number]  Date Initiated:  10/06/2014  Documentation initiated by:  Lanier ClamMAHABIR,Armoni Kludt  Subjective/Objective Assessment:   69 y/o m admitted w/Acute cholecystitis.     Action/Plan:   From home.   Anticipated DC Date:  10/08/2014   Anticipated DC Plan:  HOME/SELF CARE      DC Planning Services  CM consult      Choice offered to / List presented to:             Status of service:  In process, will continue to follow Medicare Important Message given?   (If response is "NO", the following Medicare IM given date fields will be blank) Date Medicare IM given:   Medicare IM given by:   Date Additional Medicare IM given:   Additional Medicare IM given by:    Discharge Disposition:    Per UR Regulation:  Reviewed for med. necessity/level of care/duration of stay  If discussed at Long Length of Stay Meetings, dates discussed:    Comments:  10/06/14 Lanier ClamKathy Linlee Cromie RN BSN NCM [phone] No anticipated d/c needs.

## 2014-10-06 NOTE — Procedures (Signed)
Central Venous Catheter Insertion Procedure Note Donneta RombergKenneth R Villamizar [id number] 08-Dec-1945  Procedure: Insertion of Central Venous Catheter Indications: Assessment of intravascular volume, Drug and/or fluid administration and Frequent blood sampling  Procedure Details Consent: Risks of procedure as well as the alternatives and risks of each were explained to the (patient/caregiver).  Consent for procedure obtained. Time Out: Verified patient identification, verified procedure, site/side was marked, verified correct patient position, special equipment/implants available, medications/allergies/relevent history reviewed, required imaging and test results available.  Performed  Maximum sterile technique was used including antiseptics, cap, gloves, gown, hand hygiene, mask and sheet. Skin prep: Chlorhexidine; local anesthetic administered A antimicrobial bonded/coated triple lumen catheter was placed in the left internal jugular vein using the Seldinger technique. Ultrasound guidance used.Yes.   Guidewire visualized in Left internal jugular vein. Catheter placed to 20 cm. Blood aspirated via all 3 ports and then flushed x 3. Line sutured x 2 and dressing applied.  Evaluation Blood flow good Complications: No apparent complications Patient did tolerate procedure well. Chest X-ray ordered to verify placement.  CXR: pending.  Joneen RoachPaul Rolinda Impson, AGACNP-BC [facility] Pager [phone] or [phone]  10/06/2014 10:14 PM

## 2014-10-06 NOTE — Progress Notes (Signed)
Transferred to 1223. Report given to Revonda StandardVera rn

## 2014-10-06 NOTE — Progress Notes (Signed)
Pt received in rm 1223. Alert and oriented. vomitted small amount of  light yellow emesis when transferred to bed. bp at 89/39 and dropped to 73/34 when rechecked. MD made aware. Said to start a low dose levophed gtt. Vwilliams,rn.

## 2014-10-06 NOTE — Consult Note (Signed)
Reason for consult: Percutaneous cholecystostomy   Referring Physician(s): CCS  History of Present Illness: Larry [name] is a 69 y.o. male with history of diabetes, coronary artery disease with MI 2 years ago, hypertension, recent atrial fibrillation (prescribed Eliquis but has not taken), obesity who was recently admitted to the hospital with complaints of right upper quadrant and epigastric abdominal pain, weakness, nausea and vomiting, hypotension, and elevated creatinine. Abdominal ultrasound revealed findings consistent with acute cholecystitis with gallstones and gallbladder wall thickening as well as a small amount pericholecystic fluid. Patient was seen by general surgery and request is now made for percutaneous cholecystostomy.   Past Medical History  Diagnosis Date  . Diabetes mellitus without complication   . MI (myocardial infarction)   . Hypertension   . Atrial fibrillation 10-01-14  . Coronary artery disease   . Obesity     Past Surgical History  Procedure Laterality Date  . Revision total hip arthroplasty    . Knee replacement    . Cardiac stent    . Cardiac catheterization      with stent placement in 2014  . Ankle surgery Left     Allergies: Review of patient's allergies indicates no known allergies.  Medications: Prior to Admission medications   Medication Sig Start Date End Date Taking? Authorizing Provider  acetaminophen-codeine (TYLENOL #3) 300-30 MG per tablet Take 1 tablet by mouth 2 (two) times daily.   Yes Historical Provider, MD  allopurinol (ZYLOPRIM) 300 MG tablet Take 300 mg by mouth daily.   Yes Historical Provider, MD  amLODipine (NORVASC) 10 MG tablet Take 10 mg by mouth daily.   Yes Historical Provider, MD  apixaban (ELIQUIS) 5 MG TABS tablet Take 1 tablet (5 mg total) by mouth 2 (two) times daily. 10/01/14  Yes April Palumbo, MD  aspirin 81 MG chewable tablet Chew 81 mg by mouth daily.   Yes Historical Provider, MD  atorvastatin  (LIPITOR) 80 MG tablet Take 80 mg by mouth daily.   Yes Historical Provider, MD  Cholecalciferol (VITAMIN D PO) Take 1 tablet by mouth daily.    Yes Historical Provider, MD  citalopram (CELEXA) 40 MG tablet Take 40 mg by mouth daily.   Yes Historical Provider, MD  Cyanocobalamin (VITAMIN B 12 PO) Take 1 tablet by mouth daily.    Yes Historical Provider, MD  furosemide (LASIX) 40 MG tablet Take 40 mg by mouth daily.    Yes Historical Provider, MD  insulin glargine (LANTUS) 100 UNIT/ML injection Inject 40 Units into the skin at bedtime.   Yes Historical Provider, MD  Liraglutide 18 MG/3ML SOPN Inject 1.2 mg into the skin daily.   Yes Historical Provider, MD  lisinopril (PRINIVIL,ZESTRIL) 40 MG tablet Take 40 mg by mouth daily.   Yes Historical Provider, MD  magnesium oxide (MAG-OX) 400 MG tablet Take 400 mg by mouth daily.   Yes Historical Provider, MD  metFORMIN (GLUCOPHAGE) 500 MG tablet Take 500 mg by mouth 2 (two) times daily with a meal.   Yes Historical Provider, MD  pantoprazole (PROTONIX) 40 MG tablet Take 40 mg by mouth daily.   Yes Historical Provider, MD  sucralfate (CARAFATE) 1 GM/10ML suspension Take 10 mLs (1 g total) by mouth 4 (four) times daily -  with meals and at bedtime. 10/01/14  Yes April Palumbo, MD     History reviewed. No pertinent family history.  History   Social History  . Marital Status: Married    Spouse Name: N/A  .  Number of Children: N/A  . Years of Education: N/A   Social History Main Topics  . Smoking status: Never Smoker   . Smokeless tobacco: Not on file  . Alcohol Use: No  . Drug Use: No  . Sexual Activity: Not on file   Other Topics Concern  . None   Social History Narrative      Review of Systems  See above Vital Signs: BP 97/49 mmHg  Pulse 88  Temp(Src) 99.2 F (37.3 C) (Oral)  Resp 18  Ht  (1.702 m)  Wt 260 lb (117.935 kg)  BMI 40.71 kg/m2  SpO2 96%  Physical Exam patient work but slightly drowsy; chest - clear  auscultation bilaterally anteriorly; heart with regular rate and rhythm; Abdomen obese, few bowel sounds, moderate to marked tenderness right upper quadrant; extremities full range of motion; no significant edema.   Mallampati Score:     Imaging: Ct Abdomen Pelvis Wo Contrast  10/05/2014   CLINICAL DATA:  Upper abdominal pain since last Thursday. Nausea and vomiting.  EXAM: CT ABDOMEN AND PELVIS WITHOUT CONTRAST  TECHNIQUE: Multidetector CT imaging of the abdomen and pelvis was performed following the standard protocol without IV contrast.  COMPARISON:  10/01/2014  FINDINGS: There is bibasilar atelectasis.  There is a punctate nonobstructing left renal calculus. No obstructive uropathy. No perinephric stranding is seen. The kidneys are symmetric in size without evidence for exophytic mass. The bladder is unremarkable.  The liver demonstrates no focal abnormality. The gallbladder is distended with cholelithiasis, gallbladder wall thickening and pericholecystic inflammatory changes most consistent with acute cholecystitis. The spleen demonstrates no focal abnormality. The adrenal glands and pancreas are normal.  There is a small hiatal hernia. The unopacified stomach, duodenum, small intestine and large intestine are unremarkable, but evaluation is limited by lack of oral contrast. There is no pneumoperitoneum, pneumatosis, or portal venous gas. There is no abdominal or pelvic free fluid. There is no lymphadenopathy.  The abdominal aorta is normal in caliber with atherosclerosis.  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 There is thoracolumbar spine spondylosis most severe at L3-4 and L4-5 with disc space narrowing and bilateral severe facet arthropathy.  IMPRESSION: 1. Cholelithiasis with gallbladder distention, gallbladder wall thickening and pericholecystic inflammatory changes most consistent with acute cholecystitis.   Electronically Signed   By: Elige Ko   On: 10/05/2014 19:08   Ct Angio Chest Pe  W/cm &/or Wo Cm  10/01/2014   CLINICAL DATA:  Left upper quadrant pain for 3 hours with paleness and diaphoresis.  EXAM: CT ANGIOGRAPHY CHEST, ABDOMEN AND PELVIS  TECHNIQUE: Multidetector CT imaging through the chest, abdomen and pelvis was performed using the standard protocol during bolus administration of intravenous contrast. Multiplanar reconstructed images and MIPs were obtained and reviewed to evaluate the vascular anatomy.  CONTRAST:  OMNIPAQUE IOHEXOL 350 MG/ML SOLN  COMPARISON:  None.  FINDINGS: CTA CHEST FINDINGS  THORACIC INLET/BODY WALL:  No acute abnormality.  MEDIASTINUM:  Normal heart size. No pericardial effusion. There is coronary atherosclerosis which is diffuse throughout the left and right circulation. No aortic aneurysm, dissection, or intramural hematoma. Small sliding hiatal hernia. No evidence of pulmonary embolism.  LUNG WINDOWS:  No consolidation.  No effusion.  No suspicious pulmonary nodule.  OSSEOUS:  See below  Review of the MIP images confirms the above findings.  CTA ABDOMEN AND PELVIS FINDINGS  BODY WALL: No contributory findings.  Liver: Hepatic steatosis.  No focal abnormality.  Biliary: Cholelithiasis. No evidence of biliary  obstruction or inflammation.  Pancreas: Unremarkable.  Spleen: Unremarkable.  Adrenals: Unremarkable.  Kidneys and ureters: 2 punctate calculi in the lower pole left kidney, nonobstructive. No ureteral calculus or hydronephrosis  Bladder: Partly obscured by streak artifact from of the right hip prosthesis. No pathologic findings  Reproductive: Negative for age.  Bowel: Distal colonic diverticulosis. No bowel inflammation or obstruction. Negative appendix.  Retroperitoneum: No mass or adenopathy.  Peritoneum: No ascites or pneumoperitoneum.  Vascular: Standard aortic branching. No aneurysm, dissection, or wall thickening. There is scattered atherosclerotic calcification of the aorta and branch vessels without notable stenosis.  OSSEOUS: Right hip  arthroplasty. There is near bridging bulky heterotopic ossification about the joint.  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 This is presumably posttraumatic given unilaterality.  Mid thoracic vertebral body compression deformities appear chronic. There is advanced and diffuse spondylosis. 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  Review of the MIP images confirms the above findings.  IMPRESSION: 1. No acute findings, including aortic dissection. 2. Extensive coronary atherosclerosis. 3. Hepatic steatosis. 4. Cholelithiasis. 5. Additional chronic findings are noted above.   Electronically Signed   By: Marnee Spring M.D.   On: 10/01/2014 03:54   US Abdomen Complete  10/05/2014   CLINICAL DATA:  Acute onset of right upper quadrant pain 6 days ago. Symptoms are worsening.  EXAM: ULTRASOUND ABDOMEN COMPLETE  COMPARISON:  CT abdomen same day.  FINDINGS: Gallbladder: Gallstones dependent in the gallbladder. Mild wall thickening. Positive Murphy sign. Small amount of adjacent fluid. Findings all consistent with acute cholecystitis.  Common bile duct: Diameter: 4 mm, normal  Liver: Diffuse fatty change.  No focal lesion seen.  IVC: No abnormality visualized.  Pancreas: Poorly seen because of overlying bowel gas.  Spleen: Poorly seen because of overlying bowel gas.  Right Kidney: Length: 11.0 cm. Echogenicity within normal limits. No mass or hydronephrosis visualized.  Left Kidney: Length: 10.9 cm. Echogenicity within normal limits. No mass or hydronephrosis visualized.  Abdominal aorta: No aneurysm visualized.  Other findings: None.  IMPRESSION: Consistent with acute cholecystitis. Gallstones. Wall thickening. Small amount appear cholecystic fluid. Positive Murphy sign.   Electronically Signed   By: Paulina Fusi M.D.   On: 10/05/2014 19:48   Dg Chest Portable 1 View  10/01/2014   CLINICAL DATA:  Abdominal pain, left upper quadrant  EXAM: PORTABLE CHEST - 1 VIEW   COMPARISON:  None currently available  FINDINGS: Normal heart size and mediastinal contours for technique. No acute infiltrate or edema. No effusion or pneumothorax. No acute osseous findings.  IMPRESSION: No active disease.   Electronically Signed   By: Marnee Spring M.D.   On: 10/01/2014 03:16   Ct Angio Abd/pel W/ And/or W/o  10/01/2014   CLINICAL DATA:  Left upper quadrant pain for 3 hours with paleness and diaphoresis.  EXAM: CT ANGIOGRAPHY CHEST, ABDOMEN AND PELVIS  TECHNIQUE: Multidetector CT imaging through the chest, abdomen and pelvis was performed using the standard protocol during bolus administration of intravenous contrast. Multiplanar reconstructed images and MIPs were obtained and reviewed to evaluate the vascular anatomy.  CONTRAST:  OMNIPAQUE IOHEXOL 350 MG/ML SOLN  COMPARISON:  None.  FINDINGS: CTA CHEST FINDINGS  THORACIC INLET/BODY WALL:  No acute abnormality.  MEDIASTINUM:  Normal heart size. No pericardial effusion. There is coronary atherosclerosis which is diffuse throughout the left and right circulation. No aortic aneurysm, dissection, or intramural hematoma. Small sliding hiatal hernia. No evidence of pulmonary embolism.  LUNG WINDOWS:  No consolidation.  No effusion.  No suspicious pulmonary nodule.  OSSEOUS:  See below  Review of the MIP images confirms the above findings.  CTA ABDOMEN AND PELVIS FINDINGS  BODY WALL: No contributory findings.  Liver: Hepatic steatosis.  No focal abnormality.  Biliary: Cholelithiasis. No evidence of biliary obstruction or inflammation.  Pancreas: Unremarkable.  Spleen: Unremarkable.  Adrenals: Unremarkable.  Kidneys and ureters: 2 punctate calculi in the lower pole left kidney, nonobstructive. No ureteral calculus or hydronephrosis  Bladder: Partly obscured by streak artifact from of the right hip prosthesis. No pathologic findings  Reproductive: Negative for age.  Bowel: Distal colonic diverticulosis. No bowel inflammation or obstruction.  Negative appendix.  Retroperitoneum: No mass or adenopathy.  Peritoneum: No ascites or pneumoperitoneum.  Vascular: Standard aortic branching. No aneurysm, dissection, or wall thickening. There is scattered atherosclerotic calcification of the aorta and branch vessels without notable stenosis.  OSSEOUS: Right hip arthroplasty. There is near bridging bulky heterotopic ossification about the joint.  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 This is presumably posttraumatic given unilaterality.  Mid thoracic vertebral body compression deformities appear chronic. There is advanced and diffuse spondylosis. 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  Review of the MIP images confirms the above findings.  IMPRESSION: 1. No acute findings, including aortic dissection. 2. Extensive coronary atherosclerosis. 3. Hepatic steatosis. 4. Cholelithiasis. 5. Additional chronic findings are noted above.   Electronically Signed   By: Marnee Spring M.D.   On: 10/01/2014 03:54    Labs:  CBC:  Recent Labs  10/01/14 0115 10/05/14 1640 10/06/14 0445  WBC 15.2* 11.7* 10.5  HGB 13.3 10.6* 9.5*  HCT 39.9 30.3* 27.8*  PLT 250 174 167    COAGS:  Recent Labs  10/06/14 0445  INR 1.07  APTT 34    BMP:  Recent Labs  10/01/14 0115 10/01/14 0142 10/05/14 1640 10/06/14 0445  NA 140 142 133* 137  K 3.8 3.3* 3.8 3.5  CL 99 105 98 103  CO2 27  --  25 24  GLUCOSE 240* 221* 198* 120*  BUN 24* 20 57* 52*  CALCIUM 10.3  --  7.9* 8.0*  CREATININE 1.42* 1.20 2.33* 2.30*  GFRNONAA 49*  --  27* 28*  GFRAA 57*  --  31* 32*    LIVER FUNCTION TESTS:  Recent Labs  10/01/14 0115 10/05/14 1640 10/06/14 0445  BILITOT 2.3* 2.8* 2.5*  AST 28 34 34  ALT 19 25 26   ALKPHOS 87 76 74  PROT 7.7 6.6 6.0  ALBUMIN 4.5 3.0* 2.7*    TUMOR MARKERS: No results for input(s): AFPTM, CEA, CA199, CHROMGRNA in the last 8760 hours.  Assessment and Plan: DANZELL BIRKY is a 69 y.o.  male with history of diabetes, coronary artery disease with MI 2 years ago, hypertension, recent atrial fibrillation (prescribed Eliquis but has not taken), obesity who was recently admitted to the hospital with complaints of right upper quadrant and epigastric abdominal pain, weakness, nausea and vomiting, hypotension, and elevated creatinine. Abdominal ultrasound revealed findings consistent with acute cholecystitis with gallstones and gallbladder wall thickening as well as a small amount pericholecystic fluid. Patient was seen by general surgery and request is now made for percutaneous cholecystostomy. Imaging studies and history were reviewed by Dr. Bonnielee Haff and he feels patient is candidate for percutaneous cholecystostomy. Details/risks of procedure, including but not limited to, internal bleeding, infection/sepsis , worsening renal function, need for emergent surgery, discussed with patient/family with their understanding and consent. Procedure planned for  later today.   Signed: Chinita Pester 10/06/2014, 1:52 PM   I spent a total of 20 minutes in face to face in clinical consultation, greater than 50% of which was counseling/coordinating care for percutaneous cholecystostomy

## 2014-10-07 DIAGNOSIS — I2510 Atherosclerotic heart disease of native coronary artery without angina pectoris: Secondary | ICD-10-CM

## 2014-10-07 DIAGNOSIS — I480 Paroxysmal atrial fibrillation: Secondary | ICD-10-CM | POA: Insufficient documentation

## 2014-10-07 DIAGNOSIS — A419 Sepsis, unspecified organism: Principal | ICD-10-CM

## 2014-10-07 DIAGNOSIS — K810 Acute cholecystitis: Secondary | ICD-10-CM

## 2014-10-07 DIAGNOSIS — R6521 Severe sepsis with septic shock: Secondary | ICD-10-CM

## 2014-10-07 LAB — LACTIC ACID, PLASMA
Lactic Acid, Venous: 0.9 mmol/L (ref 0.5–2.0)
Lactic Acid, Venous: 1 mmol/L (ref 0.5–2.0)

## 2014-10-07 LAB — GLUCOSE, CAPILLARY
GLUCOSE-CAPILLARY: 142 mg/dL — AB (ref 70–99)
GLUCOSE-CAPILLARY: 204 mg/dL — AB (ref 70–99)
GLUCOSE-CAPILLARY: 227 mg/dL — AB (ref 70–99)
GLUCOSE-CAPILLARY: 306 mg/dL — AB (ref 70–99)
Glucose-Capillary: 185 mg/dL — ABNORMAL HIGH (ref 70–99)
Glucose-Capillary: 202 mg/dL — ABNORMAL HIGH (ref 70–99)
Glucose-Capillary: 199 mg/dL — ABNORMAL HIGH (ref 70–99)
Glucose-Capillary: 269 mg/dL — ABNORMAL HIGH (ref 70–99)
Glucose-Capillary: 235 mg/dL — ABNORMAL HIGH (ref 70–99)
Glucose-Capillary: 261 mg/dL — ABNORMAL HIGH (ref 70–99)
Glucose-Capillary: 201 mg/dL — ABNORMAL HIGH (ref 70–99)
Glucose-Capillary: 177 mg/dL — ABNORMAL HIGH (ref 70–99)

## 2014-10-07 LAB — COMPREHENSIVE METABOLIC PANEL
ALT: 35 U/L (ref 0–53)
ANION GAP: 10 (ref 5–15)
AST: 50 U/L — ABNORMAL HIGH (ref 0–37)
Albumin: 2.6 g/dL — ABNORMAL LOW (ref 3.5–5.2)
Alkaline Phosphatase: 87 U/L (ref 39–117)
BUN: 57 mg/dL — ABNORMAL HIGH (ref 6–23)
CALCIUM: 7.7 mg/dL — AB (ref 8.4–10.5)
CO2: 22 mmol/L (ref 19–32)
Chloride: 102 mmol/L (ref 96–112)
Creatinine, Ser: 2.88 mg/dL — ABNORMAL HIGH (ref 0.50–1.35)
GFR calc Af Amer: 24 mL/min — ABNORMAL LOW (ref >90)
GFR calc non Af Amer: 21 mL/min — ABNORMAL LOW (ref >90)
Glucose, Bld: 222 mg/dL — ABNORMAL HIGH (ref 70–99)
Potassium: 4.4 mmol/L (ref 3.5–5.1)
SODIUM: 134 mmol/L — AB (ref 135–145)
TOTAL PROTEIN: 6.3 g/dL (ref 6.0–8.3)
Total Bilirubin: 2.4 mg/dL — ABNORMAL HIGH (ref 0.3–1.2)

## 2014-10-07 LAB — CBC
HEMATOCRIT: 30.4 % — AB (ref 39.0–52.0)
HEMOGLOBIN: 10.3 g/dL — AB (ref 13.0–17.0)
MCH: 31.4 pg (ref 26.0–34.0)
MCHC: 33.9 g/dL (ref 30.0–36.0)
MCV: 92.7 fL (ref 78.0–100.0)
PLATELETS: 228 10*3/uL (ref 150–400)
RBC: 3.28 MIL/uL — ABNORMAL LOW (ref 4.22–5.81)
RDW: 16.5 % — AB (ref 11.5–15.5)
WBC: 16.9 10*3/uL — ABNORMAL HIGH (ref 4.0–10.5)

## 2014-10-07 LAB — HEMOGLOBIN A1C
Hgb A1c MFr Bld: 8.2 % — ABNORMAL HIGH (ref 4.8–5.6)
Mean Plasma Glucose: 189 mg/dL

## 2014-10-07 LAB — BASIC METABOLIC PANEL
ANION GAP: 9 (ref 5–15)
BUN: 52 mg/dL — ABNORMAL HIGH (ref 6–23)
CALCIUM: 7.7 mg/dL — AB (ref 8.4–10.5)
CO2: 24 mmol/L (ref 19–32)
Chloride: 101 mmol/L (ref 96–112)
Creatinine, Ser: 2.42 mg/dL — ABNORMAL HIGH (ref 0.50–1.35)
GFR calc Af Amer: 30 mL/min — ABNORMAL LOW (ref >90)
GFR calc non Af Amer: 26 mL/min — ABNORMAL LOW (ref >90)
Glucose, Bld: 213 mg/dL — ABNORMAL HIGH (ref 70–99)
POTASSIUM: 3.5 mmol/L (ref 3.5–5.1)
SODIUM: 134 mmol/L — AB (ref 135–145)

## 2014-10-07 LAB — FOLATE RBC
FOLATE, HEMOLYSATE: 364.9 ng/mL
Folate, RBC: 1317 ng/mL (ref >498)
Hematocrit: 27.7 % — ABNORMAL LOW (ref 37.5–51.0)

## 2014-10-07 LAB — TROPONIN I
Troponin I: 0.05 ng/mL — ABNORMAL HIGH (ref <0.031)
Troponin I: 0.04 ng/mL — ABNORMAL HIGH (ref <0.031)

## 2014-10-07 MED ORDER — HYDROMORPHONE HCL 1 MG/ML IJ SOLN
0.5000 mg | INTRAMUSCULAR | Status: DC | PRN
  Administered 2014-10-07 – 2014-10-10 (×9): 0.5 mg via INTRAVENOUS
  Filled 2014-10-07 (×9): qty 1

## 2014-10-07 MED ORDER — INSULIN GLARGINE 100 UNIT/ML ~~LOC~~ SOLN
20.0000 [IU] | Freq: Every day | SUBCUTANEOUS | Status: DC
  Administered 2014-10-07: 20 [IU] via SUBCUTANEOUS
  Filled 2014-10-07: qty 0.2

## 2014-10-07 MED ORDER — HEPARIN (PORCINE) IN NACL 100-0.45 UNIT/ML-% IJ SOLN
1400.0000 [IU]/h | INTRAMUSCULAR | Status: DC
  Administered 2014-10-07: 1400 [IU]/h via INTRAVENOUS
  Filled 2014-10-07 (×2): qty 250

## 2014-10-07 MED ORDER — INSULIN REGULAR BOLUS VIA INFUSION
0.0000 [IU] | Freq: Three times a day (TID) | INTRAVENOUS | Status: DC
  Filled 2014-10-07: qty 10

## 2014-10-07 MED ORDER — DEXTROSE 50 % IV SOLN: 25.0000 mL | INTRAVENOUS | Status: DC | PRN

## 2014-10-07 MED ORDER — DEXTROSE-NACL 5-0.45 % IV SOLN
INTRAVENOUS | Status: DC
  Administered 2014-10-07 (×2): via INTRAVENOUS

## 2014-10-07 MED ORDER — HEPARIN SODIUM (PORCINE) 5000 UNIT/ML IJ SOLN: 5000.0000 [IU] | Freq: Three times a day (TID) | INTRAMUSCULAR | Status: DC

## 2014-10-07 MED ORDER — SODIUM CHLORIDE 0.9 % IV SOLN
INTRAVENOUS | Status: DC
  Administered 2014-10-07: 1.8 [IU]/h via INTRAVENOUS
  Filled 2014-10-07: qty 2.5

## 2014-10-07 MED ORDER — HEPARIN BOLUS VIA INFUSION
2000.0000 [IU] | Freq: Once | INTRAVENOUS | Status: AC
  Administered 2014-10-07: 2000 [IU] via INTRAVENOUS
  Filled 2014-10-07: qty 2000

## 2014-10-07 MED ORDER — SODIUM CHLORIDE 0.9 % IV SOLN
INTRAVENOUS | Status: DC
  Administered 2014-10-07: 18:00:00 via INTRAVENOUS

## 2014-10-07 NOTE — Progress Notes (Signed)
PULMONARY / CRITICAL CARE MEDICINE   Name: Larry [name] MRN: [id number] DOB: 04-16-1946    ADMISSION DATE:  10/05/2014 CONSULTATION DATE:  10/06/2014  REFERRING MD :  Dr. Elvera Lennox  CHIEF COMPLAINT:  Abd pain  INITIAL PRESENTATION: 69 year old male admitted for acute cholecystitis 4/19. Perc chole drain placed 4/20. Hypotensive post-procedurally to ICU for pressors. PCCM consult.   STUDIES:  RUQ [place] 4/20 > Consistent with acute cholecystitis. Gallstones. Wall thickening. Small amount appear cholecystic fluid. Positive Murphy sign.  SIGNIFICANT EVENTS: 4/19 admitted for acute cholecystitis 4/20 Perc chole drain placed, hypotensive, to ICU for pressors  HISTORY OF PRESENT ILLNESS: 69 year old male with PMH as below, which includes MI, HTN, DM, and Atrial Fib on eliquis. He was seen in ED 4/15 with LUQ pain and was discharged with presumed gastritis. He presented again to [facility] ED 4/19 late PM with complaints of RUQ abdominal pain and intractable vomiting. RUQ ultrasound in ED found cholecystitis and gallstones. He was admitted to hospitalist team. He was evaluated by cardiology who reported a moderate operative risk, and surgery, who preferred insertion of a  Perc chole drain as opposed to cholecystectomy at this time due to unknown cardiacstatus and eliquis. This was performed in IR 4/20. Upon returning to floor the patient was hypotensive and placed on levophed gtt. PCCM consulted to assist with ICU level medical management.   SUBJECTIVE:  norepi at 8, has been up and down last 12 hours.  Abd pain is better but still present Received dilaudid x 1 o/n  VITAL SIGNS: Temp:  [97.4 F (36.3 C)-99.7 F (37.6 C)] 97.4 F (36.3 C) (04/21 0730) Pulse Rate:  [67-100] 69 (04/21 1015) Resp:  [12-28] 17 (04/21 1015) BP: (71-130)/(28-101) 102/42 mmHg (04/21 1015) SpO2:  [92 %-100 %] 95 % (04/21 1015) FiO2 (%):  [50 %-100 %] 50 % (04/21 0200) Weight:  [117.2 kg (258 lb 6.1 oz)] 117.2 kg (258 lb  6.1 oz) (04/20 1900) HEMODYNAMICS: CVP:  [3 mmHg-13 mmHg] 9 mmHg VENTILATOR SETTINGS: Vent Mode:  [-]  FiO2 (%):  [50 %-100 %] 50 % INTAKE / OUTPUT:  Intake/Output Summary (Last 24 hours) at 10/07/14 1042 Last data filed at 10/07/14 1000  Gross per 24 hour  Intake 1805.24 ml  Output   1030 ml  Net 775.24 ml    PHYSICAL EXAMINATION: General:  Obese male in NAD Neuro:  Awake and alert, interacting, follows commands HEENT:  [place]/AT, no JVD, PERRL Cardiovascular:  RRR, no MRG Lungs:  Distant breath sounds but clear Abdomen:  Soft, R side tender, non-distended. RUQ drain in place, clear bilious fluid Musculoskeletal:  No acute deformity Skin:  Grossly intact  LABS:  CBC  Recent Labs Lab 10/05/14 1640 10/06/14 0445 10/07/14 0558  WBC 11.7* 10.5 16.9*  HGB 10.6* 9.5* 10.3*  HCT 30.3* 27.8* 30.4*  PLT 174 167 228   Coag's  Recent Labs Lab 10/06/14 0445  APTT 34  INR 1.07   BMET  Recent Labs Lab 10/05/14 1640 10/06/14 0445 10/07/14 0558  NA 133* 137 134*  K 3.8 3.5 4.4  CL 98 103 102  CO2 BUN 57* 52* 57*  CREATININE 2.33* 2.30* 2.88*  GLUCOSE 198* 120* 222*   Electrolytes  Recent Labs Lab 10/05/14 1640 10/06/14 0445 10/07/14 0558  CALCIUM 7.9* 8.0* 7.7*   Sepsis Markers  Recent Labs Lab 10/01/14 0427 10/05/14 1739 10/06/14 2330  LATICACIDVEN 1.39 1.63 1.0   ABG No results for input(s):  PHART, PCO2ART, PO2ART in the last 168 hours. Liver Enzymes  Recent Labs Lab 10/05/14 1640 10/06/14 0445 10/07/14 0558  AST 34 34 50*  ALT 25 26 35  ALKPHOS 76 74 87  BILITOT 2.8* 2.5* 2.4*  ALBUMIN 3.0* 2.7* 2.6*   Cardiac Enzymes  Recent Labs Lab 10/01/14 0115 10/06/14 1957 10/07/14 0029  TROPONINI <0.03 0.05* 0.05*   Glucose  Recent Labs Lab 10/06/14 1209 10/06/14 1642 10/06/14 2042 10/06/14 2311 10/07/14 0335 10/07/14 0734  GLUCAP 147* 191* 171* 185* 202* 227*    Imaging Ir Perc Cholecystostomy  10/07/2014    CLINICAL DATA:  Acute cholecystitis with sepsis  EXAM: CHOLECYSTOSTOMY  FLUOROSCOPY TIME:  48 seconds.  MEDICATIONS AND MEDICAL HISTORY: Versed 0 mg, Fentanyl 0 mcg.  Additional Medications: None  ANESTHESIA/SEDATION: None  CONTRAST:  5 cc Omnipaque 300  PROCEDURE: The procedure, risks, benefits, and alternatives were explained to the patient. Questions regarding the procedure were encouraged and answered. The patient understands and consents to the procedure.  The right upper quadrant was prepped with Betadine in a sterile fashion, and a sterile drape was applied covering the operative field. A sterile gown and sterile gloves were used for the procedure.  A 21 gauge needle was inserted into the gallbladder lumen under sonographic guidance and via transhepatic approach. It was removed over a 018 wire, which was upsized to a 3-J. A 10-French drain was advanced over the wire and coiled in the gallbladder lumen. It was sewn to the skin after being string fixed.  FINDINGS: The cystic duct is occluded.  COMPLICATIONS: None  IMPRESSION: Successful cholecystostomy. This needs to remain in place at least 6 weeks.   Electronically Signed   By: Jolaine Click M.D.   On: 10/07/2014 08:44   Dg Chest Port 1 View  10/06/2014   CLINICAL DATA:  Central line placement.  EXAM: PORTABLE CHEST - 1 VIEW  COMPARISON:  10/06/2014  FINDINGS: Interval left IJ central venous catheter placement. The tip projects over the region of the proximal SVC however I cannot exclude extension into the azygos vein. Prominent cardiomediastinal contours. Hypoaeration with interstitial and vascular crowding. Bibasilar opacities. Small effusions not excluded. No pneumothorax. Multilevel degenerative change. Right lateral approach percutaneous cholecystostomy tube.  IMPRESSION: Left IJ catheter tip projects over the proximal SVC however extension into the azygos vein is not excluded. Consider correlation with a lateral view.  Hypoaeration with interstitial  and vascular crowding. Lung base opacities, favor atelectasis.   Electronically Signed   By: Jearld Lesch M.D.   On: 10/06/2014 22:33   Dg Chest Port 1 View  10/06/2014   CLINICAL DATA:  Shortness of breath, dyspnea, post percutaneous cholecystostomy earlier today, diabetes, hypertension, coronary artery disease post MI, atrial fibrillation  EXAM: PORTABLE CHEST - 1 VIEW  COMPARISON:  Portable exam 1648 hours compared to 10/01/2014  FINDINGS: Enlargement of cardiac silhouette.  Mediastinal contours and pulmonary vascularity normal.  Mild RIGHT basilar atelectasis, new.  Remaining lungs clear.  No pleural effusion or pneumothorax.  RIGHT glenohumeral degenerative changes and probable chronic rotator cuff tear.  IMPRESSION: Mild RIGHT basilar atelectasis.   Electronically Signed   By: Ulyses Southward M.D.   On: 10/06/2014 17:10     ASSESSMENT / PLAN:  PULMONARY A: Acute hypoxemic respiratory failure  P:   Supplemental O2 as needed to maintain SpO2 > 92%  CARDIOVASCULAR CVL 4/20 > A:  Septic shock, lactate peak 2.12 H/o PAF on eliquis (now in NSR), anticoag held  H/o  CAD P:  Volume resuscitation based on goal CVP 8-12 MAP goal > 2365mm/Hg Levophed for MAP goal  RENAL A:   AKI suspect prerenal / ATN etiology  P:   Follow renal fxn, UOP with volume and pressor support  GASTROINTESTINAL A:   Acute cholecystitis s/p perc chole drain  P:   Clear Diet OK with IR and CCS SUP: IV protonix See ID section Monitor drain output Will need cholecystectomy in 6-8 weeks per surgery.  HEMATOLOGIC A:   Anemia  P:  Follow CBC Transfuse per ICU guidelines Resume VTE chemoprophylaxis 4/21, eliquis when OK with IR  INFECTIOUS A:   Acute cholecystitis  P:   Body fluid culture 4/20 >> GNR >>  Abx: pip/tazo, start date 4/20 Follow WBC and fever curve Will need 2 weeks of abx total  ENDOCRINE A:   DM2    P:   CBG monitoring and SSI  NEUROLOGIC A:   Acute metabolic  encephalopathy, improved Pain management  P:   RASS goal: 0 Dilaudid PRN   FAMILY  - Updates:   - Inter-disciplinary family meet or Palliative Care meeting due by:  4/27  35 minutes CC time./  Levy Pupaobert Byrum, MD, PhD 10/07/2014, 10:48 AM [facility] [phone] or if no answer [phone]

## 2014-10-07 NOTE — Progress Notes (Signed)
Inpatient Diabetes Program Recommendations  AACE/ADA: New Consensus Statement on Inpatient Glycemic Control (2013)  Target Ranges:  Prepandial:   less than 140 mg/dL      Peak postprandial:   less than 180 mg/dL (1-2 hours)      Critically ill patients:  140 - 180 mg/dL   Reason for Visit: Hyperglycemia  Diabetes history: DM2 Outpatient Diabetes medications: Lantus 40 units QHS, Victoza 1.2 mg QD, metformin 500 mg bid Current orders for Inpatient glycemic control: ICU Hyperglycemia Protocol 2-4-6.  69 y.o. male presents with abdominal pain, found to have acute cholecystitis on imaging. Discussed ICU protocol with RN - Dr. Elvera LennoxGherghe to enter basal insulin orders.   Inpatient Diabetes Program Recommendations Insulin - Basal: Needs basal insulin - RN to call MD for orders. Recommend at least 1/2 of home dose, which would be Lantus 20 units QD. Insulin - Meal Coverage: Will likely need meal coverage insulin when diet is advanced.  Note: HgbA1C - 8.2% - may not be accurate d/t low H/H. Pt states last HgbA1C was around 5%.  Will follow. Thank you. Ailene Ardshonda Iran Kievit, RD, LDN, CDE Inpatient Diabetes Coordinator [phone]

## 2014-10-07 NOTE — Progress Notes (Signed)
Patient ID: Larry RombergKenneth R [name], male   DOB: 13-Jun-1946, 69 y.o.   MRN: [id number]    Referring Physician(s): CCS  Subjective:  Patient feeling better today; more alert than yesterday; some soreness in right upper quadrant area at site of gallbladder drain; still on pressors at this time- unable to wean  Allergies: Review of patient's allergies indicates no known allergies.  Medications: Prior to Admission medications   Medication Sig Start Date End Date Taking? Authorizing Provider  acetaminophen-codeine (TYLENOL #3) 300-30 MG per tablet Take 1 tablet by mouth 2 (two) times daily.   Yes Historical Provider, MD  allopurinol (ZYLOPRIM) 300 MG tablet Take 300 mg by mouth daily.   Yes Historical Provider, MD  amLODipine (NORVASC) 10 MG tablet Take 10 mg by mouth daily.   Yes Historical Provider, MD  apixaban (ELIQUIS) 5 MG TABS tablet Take 1 tablet (5 mg total) by mouth 2 (two) times daily. 10/01/14  Yes April Palumbo, MD  aspirin 81 MG chewable tablet Chew 81 mg by mouth daily.   Yes Historical Provider, MD  atorvastatin (LIPITOR) 80 MG tablet Take 80 mg by mouth daily.   Yes Historical Provider, MD  Cholecalciferol (VITAMIN D PO) Take 1 tablet by mouth daily.    Yes Historical Provider, MD  citalopram (CELEXA) 40 MG tablet Take 40 mg by mouth daily.   Yes Historical Provider, MD  Cyanocobalamin (VITAMIN B 12 PO) Take 1 tablet by mouth daily.    Yes Historical Provider, MD  furosemide (LASIX) 40 MG tablet Take 40 mg by mouth daily.    Yes Historical Provider, MD  insulin glargine (LANTUS) 100 UNIT/ML injection Inject 40 Units into the skin at bedtime.   Yes Historical Provider, MD  Liraglutide 18 MG/3ML SOPN Inject 1.2 mg into the skin daily.   Yes Historical Provider, MD  lisinopril (PRINIVIL,ZESTRIL) 40 MG tablet Take 40 mg by mouth daily.   Yes Historical Provider, MD  magnesium oxide (MAG-OX) 400 MG tablet Take 400 mg by mouth daily.   Yes Historical Provider, MD  metFORMIN (GLUCOPHAGE) 500  MG tablet Take 500 mg by mouth 2 (two) times daily with a meal.   Yes Historical Provider, MD  pantoprazole (PROTONIX) 40 MG tablet Take 40 mg by mouth daily.   Yes Historical Provider, MD  sucralfate (CARAFATE) 1 GM/10ML suspension Take 10 mLs (1 g total) by mouth 4 (four) times daily -  with meals and at bedtime. 10/01/14  Yes April Palumbo, MD     Vital Signs: BP 121/41 mmHg  Pulse 76  Temp(Src) 97.4 F (36.3 C) (Oral)  Resp 32  Ht 5\' 8"  (1.727 m)  Wt 258 lb 6.1 oz (117.2 kg)  BMI 39.30 kg/m2  SpO2 93%  Physical Exam patient awake alert, gallbladder drain intact, output 375 mL  bile, no hemobilia' cx's pend; abd soft, +BS, mildly tender RUQ  Imaging: Ct Abdomen Pelvis Wo Contrast  10/05/2014   CLINICAL DATA:  Upper abdominal pain since last Thursday. Nausea and vomiting.  EXAM: CT ABDOMEN AND PELVIS WITHOUT CONTRAST  TECHNIQUE: Multidetector CT imaging of the abdomen and pelvis was performed following the standard protocol without IV contrast.  COMPARISON:  10/01/2014  FINDINGS: There is bibasilar atelectasis.  There is a punctate nonobstructing left renal calculus. No obstructive uropathy. No perinephric stranding is seen. The kidneys are symmetric in size without evidence for exophytic mass. The bladder is unremarkable.  The liver demonstrates no focal abnormality. The gallbladder is distended with cholelithiasis, gallbladder wall thickening  and pericholecystic inflammatory changes most consistent with acute cholecystitis. The spleen demonstrates no focal abnormality. The adrenal glands and pancreas are normal.  There is a small hiatal hernia. The unopacified stomach, duodenum, small intestine and large intestine are unremarkable, but evaluation is limited by lack of oral contrast. There is no pneumoperitoneum, pneumatosis, or portal venous gas. There is no abdominal or pelvic free fluid. There is no lymphadenopathy.  The abdominal aorta is normal in caliber with atherosclerosis.  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 There is thoracolumbar spine spondylosis most severe at L3-4 and L4-5 with disc space narrowing and bilateral severe facet arthropathy.  IMPRESSION: 1. Cholelithiasis with gallbladder distention, gallbladder wall thickening and pericholecystic inflammatory changes most consistent with acute cholecystitis.   Electronically Signed   By: Elige Ko   On: 10/05/2014 19:08   US Abdomen Complete  10/05/2014   CLINICAL DATA:  Acute onset of right upper quadrant pain 6 days ago. Symptoms are worsening.  EXAM: ULTRASOUND ABDOMEN COMPLETE  COMPARISON:  CT abdomen same day.  FINDINGS: Gallbladder: Gallstones dependent in the gallbladder. Mild wall thickening. Positive Murphy sign. Small amount of adjacent fluid. Findings all consistent with acute cholecystitis.  Common bile duct: Diameter: 4 mm, normal  Liver: Diffuse fatty change.  No focal lesion seen.  IVC: No abnormality visualized.  Pancreas: Poorly seen because of overlying bowel gas.  Spleen: Poorly seen because of overlying bowel gas.  Right Kidney: Length: 11.0 cm. Echogenicity within normal limits. No mass or hydronephrosis visualized.  Left Kidney: Length: 10.9 cm. Echogenicity within normal limits. No mass or hydronephrosis visualized.  Abdominal aorta: No aneurysm visualized.  Other findings: None.  IMPRESSION: Consistent with acute cholecystitis. Gallstones. Wall thickening. Small amount appear cholecystic fluid. Positive Murphy sign.   Electronically Signed   By: Paulina Fusi M.D.   On: 10/05/2014 19:48   Ir Perc Cholecystostomy  10/07/2014   CLINICAL DATA:  Acute cholecystitis with sepsis  EXAM: CHOLECYSTOSTOMY  FLUOROSCOPY TIME:  48 seconds.  MEDICATIONS AND MEDICAL HISTORY: Versed 0 mg, Fentanyl 0 mcg.  Additional Medications: None  ANESTHESIA/SEDATION: None  CONTRAST:  5 cc Omnipaque 300  PROCEDURE: The procedure, risks, benefits, and alternatives were explained to the patient. Questions regarding the procedure were  encouraged and answered. The patient understands and consents to the procedure.  The right upper quadrant was prepped with Betadine in a sterile fashion, and a sterile drape was applied covering the operative field. A sterile gown and sterile gloves were used for the procedure.  A 21 gauge needle was inserted into the gallbladder lumen under sonographic guidance and via transhepatic approach. It was removed over a 018 wire, which was upsized to a 3-J. A 10-French drain was advanced over the wire and coiled in the gallbladder lumen. It was sewn to the skin after being string fixed.  FINDINGS: The cystic duct is occluded.  COMPLICATIONS: None  IMPRESSION: Successful cholecystostomy. This needs to remain in place at least 6 weeks.   Electronically Signed   By: Jolaine Click M.D.   On: 10/07/2014 08:44   Dg Chest Port 1 View  10/06/2014   CLINICAL DATA:  Central line placement.  EXAM: PORTABLE CHEST - 1 VIEW  COMPARISON:  10/06/2014  FINDINGS: Interval left IJ central venous catheter placement. The tip projects over the region of the proximal SVC however I cannot exclude extension into the azygos vein. Prominent cardiomediastinal contours. Hypoaeration with interstitial and vascular crowding. Bibasilar opacities. Small effusions not excluded. No  pneumothorax. Multilevel degenerative change. Right lateral approach percutaneous cholecystostomy tube.  IMPRESSION: Left IJ catheter tip projects over the proximal SVC however extension into the azygos vein is not excluded. Consider correlation with a lateral view.  Hypoaeration with interstitial and vascular crowding. Lung base opacities, favor atelectasis.   Electronically Signed   By: Jearld Lesch M.D.   On: 10/06/2014 22:33   Dg Chest Port 1 View  10/06/2014   CLINICAL DATA:  Shortness of breath, dyspnea, post percutaneous cholecystostomy earlier today, diabetes, hypertension, coronary artery disease post MI, atrial fibrillation  EXAM: PORTABLE CHEST - 1 VIEW   COMPARISON:  Portable exam 1648 hours compared to 10/01/2014  FINDINGS: Enlargement of cardiac silhouette.  Mediastinal contours and pulmonary vascularity normal.  Mild RIGHT basilar atelectasis, new.  Remaining lungs clear.  No pleural effusion or pneumothorax.  RIGHT glenohumeral degenerative changes and probable chronic rotator cuff tear.  IMPRESSION: Mild RIGHT basilar atelectasis.   Electronically Signed   By: Ulyses Southward M.D.   On: 10/06/2014 17:10    Labs:  CBC:  Recent Labs  10/01/14 0115 10/05/14 1640 10/06/14 0445 10/07/14 0558  WBC 15.2* 11.7* 10.5 16.9*  HGB 13.3 10.6* 9.5* 10.3*  HCT 39.9 30.3* 27.8*  27.7* 30.4*  PLT 250 174 167 228    COAGS:  Recent Labs  10/06/14 0445  INR 1.07  APTT 34    BMP:  Recent Labs  10/01/14 0115 10/01/14 0142 10/05/14 1640 10/06/14 0445 10/07/14 0558  NA 140 142 133* 137 134*  K 3.8 3.3* 3.8 3.5 4.4  CL 99 105 98 103 102  CO2 27  --  GLUCOSE 240* 221* 198* 120* 222*  BUN 24* 20 57* 52* 57*  CALCIUM 10.3  --  7.9* 8.0* 7.7*  CREATININE 1.42* 1.20 2.33* 2.30* 2.88*  GFRNONAA 49*  --  27* 28* 21*  GFRAA 57*  --  31* 32* 24*    LIVER FUNCTION TESTS:  Recent Labs  10/01/14 0115 10/05/14 1640 10/06/14 0445 10/07/14 0558  BILITOT 2.3* 2.8* 2.5* 2.4*  AST 28 34 34 50*  ALT 35  ALKPHOS 87 76 74 87  PROT 7.7 6.6 6.0 6.3  ALBUMIN 4.5 3.0* 2.7* 2.6*    Assessment and Plan: Acute cholecystitis, status post percutaneous cholecystostomy 4/20; post procedure hypotension and transferred to ICU for pressors; patient currently afebrile; WBC 16.9, hemoglobin 10.3; total bilirubin 2.4 (2.5), creatinine 2.88; okay to resume anticoagulation; check final bile cultures/sensitivity;  hydrate, drain will need to remain in place for at least 4-6 weeks to allow for tract maturation unless cholecystectomy done in the interim.  Signed: Chinita Pester 10/07/2014, 4:02 PM   I spent a total of  in face to face in  clinical consultation/evaluation, greater than 50% of which was counseling/coordinating care for percutaneous cholecystostomy

## 2014-10-07 NOTE — Progress Notes (Signed)
  Echocardiogram 2D Echocardiogram has been performed.  Branston Halsted FRANCES 10/07/2014, 11:22 AM

## 2014-10-07 NOTE — Progress Notes (Signed)
Subjective: He is feeling better, telemetry shows SR with PVC's.Larry [name] is clear brownish colored fluid.  No pain in abdomen.  Objective: Vital signs in last 24 hours: Temp:  [98.1 F (36.7 C)-99.7 F (37.6 C)] 98.4 F (36.9 C) (04/21 0400) Pulse Rate:  [67-100] 70 (04/21 0700) Resp:  [12-28] 17 (04/21 0700) BP: (71-128)/(28-91) 105/44 mmHg (04/21 0700) SpO2:  [92 %-100 %] 97 % (04/21 0700) FiO2 (%):  [50 %-100 %] 50 % (04/21 0200) Weight:  [117.2 kg (258 lb 6.1 oz)] 117.2 kg (258 lb 6.1 oz) (04/20 1900) Last BM Date: 10/04/14  375 from drain placed yesterday TM 99.7, hypotensive post PO, BP up to 121 this AM 0700. BMP still pending, creatinine up to 2.88 Trop 0.05 x 2 WBC is up to 16.9, H/H is stable. Intake/Output from previous day: 04/20 0701 - 04/21 0700 In: 1635.8 [I.V.:535.8; IV Piggyback:100] Out: 1030 [Urine:655; Drains:375] Intake/Output this shift:    General appearance: alert, cooperative and no distress Resp: clear to auscultation bilaterally and anterior exam GI: soft, BS hypoactive, no pain, distension or tenderness.  Lab Results:   Recent Labs  10/06/14 0445 10/07/14 0558  WBC 10.5 16.9*  HGB 9.5* 10.3*  HCT 27.8* 30.4*  PLT 167 228    BMET  Recent Labs  10/05/14 1640 10/06/14 0445  NA 133* 137  K 3.8 3.5  CL 98 103  CO2 25 24  GLUCOSE 198* 120*  BUN 57* 52*  CREATININE 2.33* 2.30*  CALCIUM 7.9* 8.0*   PT/INR  Recent Labs  10/06/14 0445  LABPROT 14.0  INR 1.07     Recent Labs Lab 10/01/14 0115 10/05/14 1640 10/06/14 0445  AST 28 34 34  ALT ALKPHOS 87 76 74  BILITOT 2.3* 2.8* 2.5*  PROT 7.7 6.6 6.0  ALBUMIN 4.5 3.0* 2.7*     Lipase     Component Value Date/Time   LIPASE 25 10/05/2014 1640     Studies/Results: Ct Abdomen Pelvis Wo Contrast  10/05/2014   CLINICAL DATA:  Upper abdominal pain since last Thursday. Nausea and vomiting.  EXAM: CT ABDOMEN AND PELVIS WITHOUT CONTRAST  TECHNIQUE: Multidetector  CT imaging of the abdomen and pelvis was performed following the standard protocol without IV contrast.  COMPARISON:  10/01/2014  FINDINGS: There is bibasilar atelectasis.  There is a punctate nonobstructing left renal calculus. No obstructive uropathy. No perinephric stranding is seen. The kidneys are symmetric in size without evidence for exophytic mass. The bladder is unremarkable.  The liver demonstrates no focal abnormality. The gallbladder is distended with cholelithiasis, gallbladder wall thickening and pericholecystic inflammatory changes most consistent with acute cholecystitis. The spleen demonstrates no focal abnormality. The adrenal glands and pancreas are normal.  There is a small hiatal hernia. The unopacified stomach, duodenum, small intestine and large intestine are unremarkable, but evaluation is limited by lack of oral contrast. There is no pneumoperitoneum, pneumatosis, or portal venous gas. There is no abdominal or pelvic free fluid. There is no lymphadenopathy.  The abdominal aorta is normal in caliber with atherosclerosis.  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 There is thoracolumbar spine spondylosis most severe at L3-4 and L4-5 with disc space narrowing and bilateral severe facet arthropathy.  IMPRESSION: 1. Cholelithiasis with gallbladder distention, gallbladder wall thickening and pericholecystic inflammatory changes most consistent with acute cholecystitis.   Electronically Signed   By: Elige Ko   On: 10/05/2014 19:08   US Abdomen Complete  10/05/2014   CLINICAL DATA:  Acute onset of right upper quadrant pain 6 days ago. Symptoms are worsening.  EXAM: ULTRASOUND ABDOMEN COMPLETE  COMPARISON:  CT abdomen same day.  FINDINGS: Gallbladder: Gallstones dependent in the gallbladder. Mild wall thickening. Positive Murphy sign. Small amount of adjacent fluid. Findings all consistent with acute cholecystitis.  Common bile duct: Diameter: 4 mm, normal  Liver: Diffuse fatty change.  No  focal lesion seen.  IVC: No abnormality visualized.  Pancreas: Poorly seen because of overlying bowel gas.  Spleen: Poorly seen because of overlying bowel gas.  Right Kidney: Length: 11.0 cm. Echogenicity within normal limits. No mass or hydronephrosis visualized.  Left Kidney: Length: 10.9 cm. Echogenicity within normal limits. No mass or hydronephrosis visualized.  Abdominal aorta: No aneurysm visualized.  Other findings: None.  IMPRESSION: Consistent with acute cholecystitis. Gallstones. Wall thickening. Small amount appear cholecystic fluid. Positive Murphy sign.   Electronically Signed   By: Paulina Fusi M.D.   On: 10/05/2014 19:48   Dg Chest Port 1 View  10/06/2014   CLINICAL DATA:  Central line placement.  EXAM: PORTABLE CHEST - 1 VIEW  COMPARISON:  10/06/2014  FINDINGS: Interval left IJ central venous catheter placement. The tip projects over the region of the proximal SVC however I cannot exclude extension into the azygos vein. Prominent cardiomediastinal contours. Hypoaeration with interstitial and vascular crowding. Bibasilar opacities. Small effusions not excluded. No pneumothorax. Multilevel degenerative change. Right lateral approach percutaneous cholecystostomy tube.  IMPRESSION: Left IJ catheter tip projects over the proximal SVC however extension into the azygos vein is not excluded. Consider correlation with a lateral view.  Hypoaeration with interstitial and vascular crowding. Lung base opacities, favor atelectasis.   Electronically Signed   By: Jearld Lesch M.D.   On: 10/06/2014 22:33   Dg Chest Port 1 View  10/06/2014   CLINICAL DATA:  Shortness of breath, dyspnea, post percutaneous cholecystostomy earlier today, diabetes, hypertension, coronary artery disease post MI, atrial fibrillation  EXAM: PORTABLE CHEST - 1 VIEW  COMPARISON:  Portable exam 1648 hours compared to 10/01/2014  FINDINGS: Enlargement of cardiac silhouette.  Mediastinal contours and pulmonary vascularity normal.  Mild  RIGHT basilar atelectasis, new.  Remaining lungs clear.  No pleural effusion or pneumothorax.  RIGHT glenohumeral degenerative changes and probable chronic rotator cuff tear.  IMPRESSION: Mild RIGHT basilar atelectasis.   Electronically Signed   By: Ulyses Southward M.D.   On: 10/06/2014 17:10    Medications: . allopurinol  300 mg Oral Daily  . antiseptic oral rinse  7 mL Mouth Rinse q12n4p  . antiseptic oral rinse  7 mL Mouth Rinse q12n4p  . chlorhexidine  15 mL Mouth Rinse BID  . chlorhexidine  15 mL Mouth Rinse BID  . folic acid  1 mg Oral Daily  . insulin aspart  2-6 Units Subcutaneous 6 times per day  . multivitamin with minerals  1 tablet Oral Daily  . pantoprazole  40 mg Oral Daily  . piperacillin-tazobactam (ZOSYN)  IV  3.375 g Intravenous 3 times per day  . thiamine  100 mg Oral Daily   . sodium chloride 10 mL/hr at 10/06/14 2322  . norepinephrine (LEVOPHED) Adult infusion 8 mcg/min (10/07/14 0746)    Assessment/Plan Acute cholecystitis with cholelithiasis S/p IR percutaneous cholecystostomy placement 10/06/14, Dr. Bonnielee Haff. CAD s/p MI about 2 years ago A fib Acute kidney injury AODM Anemia DVT:  TED hose Zosyn:  Started 4/19 day 3 of treatment today  Plan:  BP is up, but he is still on some  Levophed, stable from our standpoint.  I would continue Zosyn,   Defer to Medicine for further work up and treatment.  I would check with IR and see when they are ready for Eliquis to be started. We will follow.      LOS: 2 days    Rhythm Wigfall 10/07/2014

## 2014-10-07 NOTE — Progress Notes (Signed)
PROGRESS NOTE  Larry [name] WUJ:[id number] DOB: 15-Aug-1945 DOA: 10/05/2014 PCP: Pcp Not In System  HPI: Larry RombergKenneth R [name] is a 69 y.o. male presents with abdominal pain, found to have acute cholecystitis on imaging.   Subjective / 24 H Interval events - underwent percutaneous drain placement yesterday, post procedure became hypotensive and hypoxic requiring ICU transfer and initiation of pressors - clinically feels better this morning - breathing improved, able to be weaned off from NRB to [place] - still on vasopressors - denies chest pain, denies nausea/vomiting. Had one episode of emesis last night.   Assessment/Plan: Principal Problem:   Cholecystitis Active Problems:   Diabetes mellitus without complication   HTN (hypertension)   Hypotension   Anemia   CAD (coronary artery disease)   Acute cholecystitis   Dyspnea   Septic shock   Septic shock - requiring ICU transfer 4/20 and initiation of Levophed - still requiring vasopressors this morning, clinically he looks improved however remains critically ill with end organ dysfunction with renal failure and mild troponin elevation - PCCM following  Acute cholecystitis with cholelithiasis - US positive for cholecystitis, general surgery consulted and following - s/p perc drain per IR 4/20; culture sent and stain with few GNRs, hopefully will have speciation in a few days  - continue Zosyn  CAD s/p MI about 2 years ago - was on Plavix for a year, has never followed up with cardiology since - appreciate cardiology evaluation, 2D echo just done, final read is pending - troponin borderline elevated, no chest pain, EKG non ischemic, likely due to demand in the setting of septic shock   A fib - this was seen initially on 4/15 when he presented to the ED, started on Eliquis however patient has not started it yet - currently he is in sinus rhythm, will obtain 2D echo as bove - I discussed with IR, OK from their standpoint to start  anticoagulation today, will start heparin as his renal function is poor, change to Eliquis once renal function improves.   DM - SSI, A1C at 8.2 showing poor control.   Anemia - unknown baseline, stable  AKI  - unknown baseline, likely worsening renal function in the setting of dehydration due to poor po intake and septic shock - had contrasted CT for PE on 4/15 so possible contrast induced as well - long standing DM can contribute - Cr this morning 2.9, likely worsening in the setting of septic shock, continue to closely monitor   Diet: Diet NPO time specified Fluids: NS DVT Prophylaxis: heparin gtt  Code Status: Full Code Family Communication: d/w wife bedside  Disposition Plan: remain in ICU  Consultants:  General surgery   Cardiology   IR  PCCM  Procedures:  Percutaneous cholecystostomy 4/20    Antibiotics Zosyn 4/19 >>   Studies - personally reviewed studies 4/20  1. Ct Abdomen Pelvis Wo Contrast 10/05/2014 1. Cholelithiasis with gallbladder distention, gallbladder wall thickening and pericholecystic inflammatory changes most consistent with acute cholecystitis.   2. [place] Abdomen Complete 10/05/2014 Consistent with acute cholecystitis. Gallstones. Wall thickening. Small amount appear cholecystic fluid. Positive Murphy sign.   3. CXR 4/20 Mild RIGHT basilar atelectasis. 4. CXR 4/20 Left IJ catheter tip projects over the proximal SVC however extension into the azygos vein is not excluded. Consider correlation with a lateral view. Hypoaeration with interstitial and vascular crowding. Lung base opacities, favor atelectasis. 5. IR Perc cholecystostomy 4/20 Successful cholecystostomy. This needs to remain in place at least 6  weeks.  Objective  Filed Vitals:   10/07/14 0430 10/07/14 0600 10/07/14 0615 10/07/14 0630  BP: 91/42 79/28 128/47 107/43  Pulse: 70 75 67 69  Temp:      TempSrc:      Resp: Height:      Weight:      SpO2: 97% 95% 96% 99%     Intake/Output Summary (Last 24 hours) at 10/07/14 0715 Last data filed at 10/07/14 0600  Gross per 24 hour  Intake 1635.77 ml  Output   1030 ml  Net 605.77 ml   Filed Weights   10/05/14 1613 10/06/14 1900  Weight: 117.935 kg (260 lb) 117.2 kg (258 lb 6.1 oz)    Exam:  General:  NAD, pleasant, [name] in place  HEENT: no scleral icterus noticed, PERRL  Cardiovascular: RRR without MRG, 2+ peripheral pulses, trace LE edema  Respiratory: good air movement, no wheezing, no crackles, no rales  Abdomen: soft, no tenderness but "pressure" to palpation RUQ, BS +, drain in place   MSK/Extremities: no clubbing/cyanosis, no joint swelling  Skin: no rashes  Neuro: non focal, A x O x 4  Data Reviewed: Basic Metabolic Panel:  Recent Labs Lab 10/01/14 0115 10/01/14 0142 10/05/14 1640 10/06/14 0445 10/07/14 0558  NA 140 142 133* 137 134*  K 3.8 3.3* 3.8 3.5 4.4  CL 99 105 98 103 102  CO2 27  --  GLUCOSE 240* 221* 198* 120* 222*  BUN 24* 20 57* 52* 57*  CREATININE 1.42* 1.20 2.33* 2.30* 2.88*  CALCIUM 10.3  --  7.9* 8.0* 7.7*   Liver Function Tests:  Recent Labs Lab 10/01/14 0115 10/05/14 1640 10/06/14 0445 10/07/14 0558  AST 28 34 34 50*  ALT 35  ALKPHOS 87 76 74 87  BILITOT 2.3* 2.8* 2.5* 2.4*  PROT 7.7 6.6 6.0 6.3  ALBUMIN 4.5 3.0* 2.7* 2.6*    Recent Labs Lab 10/01/14 0115 10/05/14 1640  LIPASE 36 25   CBC:  Recent Labs Lab 10/01/14 0115 10/05/14 1640 10/06/14 0445 10/07/14 0558  WBC 15.2* 11.7* 10.5 16.9*  NEUTROABS 12.4* 9.7*  --   --   HGB 13.3 10.6* 9.5* 10.3*  HCT 39.9 30.3* 27.8* 30.4*  MCV 92.4 89.1 90.8 92.7  PLT 250 174 167 228   Cardiac Enzymes:  Recent Labs Lab 10/01/14 0115 10/06/14 1957 10/07/14 0029  TROPONINI <0.03 0.05* 0.05*   CBG:  Recent Labs Lab 10/06/14 0337 10/06/14 0734 10/06/14 1209 10/06/14 1642 10/06/14 2042  GLUCAP 125* 102* 147* 191* 171*    Recent Results (from the past 240  hour(s))  Surgical pcr screen     Status: None   Collection Time: 10/06/14  8:06 AM  Result Value Ref Range Status   MRSA, PCR NEGATIVE NEGATIVE Final   Staphylococcus aureus NEGATIVE NEGATIVE Final    Comment:        The Xpert SA Assay (FDA approved for NASAL specimens in patients over 35 years of age), is one component of a comprehensive surveillance program.  Test performance has been validated by [facility] for patients greater than or equal to 72 year old. It is not intended to diagnose infection nor to guide or monitor treatment.      Scheduled Meds: . allopurinol  300 mg Oral Daily  . antiseptic oral rinse  7 mL Mouth Rinse q12n4p  . antiseptic oral rinse  7 mL Mouth Rinse q12n4p  . chlorhexidine  15 mL Mouth Rinse BID  . chlorhexidine  15 mL Mouth Rinse BID  . folic acid  1 mg Oral Daily  . insulin aspart  2-6 Units Subcutaneous 6 times per day  . multivitamin with minerals  1 tablet Oral Daily  . pantoprazole  40 mg Oral Daily  . piperacillin-tazobactam (ZOSYN)  IV  3.375 g Intravenous 3 times per day  . thiamine  100 mg Oral Daily   Continuous Infusions: . sodium chloride 10 mL/hr at 10/06/14 2322  . norepinephrine (LEVOPHED) Adult infusion 12 mcg/min (10/07/14 1610)    Pamella Pert, MD [facility] Pager [phone]. If 7 PM - 7 AM, please contact night-coverage at [email or web address], password [facility] 10/07/2014, 7:15 AM  LOS: 2 days

## 2014-10-07 NOTE — Plan of Care (Signed)
Problem: Phase I Progression Outcomes Goal: Vital signs/hemodynamically stable Outcome: Not Met (add Reason) Continues on low dose Vasopressors to maintain MAP at 65.

## 2014-10-07 NOTE — Progress Notes (Signed)
Patient Name: Larry [name] Date of Encounter: 10/07/2014     Principal Problem:   Cholecystitis Active Problems:   Diabetes mellitus without complication   HTN (hypertension)   Hypotension   Anemia   CAD (coronary artery disease)   Acute cholecystitis   Dyspnea   Septic shock    SUBJECTIVE  The patient has been stable overnight.  Remains in normal sinus rhythm.  No chest pain or shortness of breath.  Cholecystostomy tube in place  CURRENT MEDS . allopurinol  300 mg Oral Daily  . antiseptic oral rinse  7 mL Mouth Rinse q12n4p  . antiseptic oral rinse  7 mL Mouth Rinse q12n4p  . chlorhexidine  15 mL Mouth Rinse BID  . chlorhexidine  15 mL Mouth Rinse BID  . folic acid  1 mg Oral Daily  . insulin aspart  2-6 Units Subcutaneous 6 times per day  . multivitamin with minerals  1 tablet Oral Daily  . pantoprazole  40 mg Oral Daily  . piperacillin-tazobactam (ZOSYN)  IV  3.375 g Intravenous 3 times per day  . thiamine  100 mg Oral Daily    OBJECTIVE  Filed Vitals:   10/07/14 0430 10/07/14 0600 10/07/14 0615 10/07/14 0630  BP: 91/42 79/28 128/47 107/43  Pulse: 70 75 67 69  Temp:      TempSrc:      Resp: 12 18 16 18   Height:      Weight:      SpO2: 97% 95% 96% 99%    Intake/Output Summary (Last 24 hours) at 10/07/14 3664 Last data filed at 10/07/14 0600  Gross per 24 hour  Intake 1635.77 ml  Output   1030 ml  Net 605.77 ml   Filed Weights   10/05/14 1613 10/06/14 1900  Weight: 260 lb (117.935 kg) 258 lb 6.1 oz (117.2 kg)    PHYSICAL EXAM  General: Pleasant, NAD.  Internal jugular catheters left neck Neuro: Alert and oriented X 3. Moves all extremities spontaneously. Psych: Normal affect. HEENT:  Normal  Neck: Supple without bruits or JVD. Lungs:  Resp regular and unlabored, CTA. Heart: RRR no s3, s4, or murmurs. Abdomen: Soft, non-tender, non-distended, BS + x 4.  Extremities: No clubbing, cyanosis or edema. DP/PT/Radials 2+ and equal  bilaterally.  Accessory Clinical Findings  CBC  Recent Labs  10/05/14 1640 10/06/14 0445 10/07/14 0558  WBC 11.7* 10.5 16.9*  NEUTROABS 9.7*  --   --   HGB 10.6* 9.5* 10.3*  HCT 30.3* 27.8* 30.4*  MCV 89.1 90.8 92.7  PLT 174 167 228   Basic Metabolic Panel  Recent Labs  10/05/14 1640 10/06/14 0445  NA 133* 137  K 3.8 3.5  CL 98 103  CO2 25 24  GLUCOSE 198* 120*  BUN 57* 52*  CREATININE 2.33* 2.30*  CALCIUM 7.9* 8.0*   Liver Function Tests  Recent Labs  10/05/14 1640 10/06/14 0445  AST 34 34  ALT 25 26  ALKPHOS 76 74  BILITOT 2.8* 2.5*  PROT 6.6 6.0  ALBUMIN 3.0* 2.7*    Recent Labs  10/05/14 1640  LIPASE 25   Cardiac Enzymes  Recent Labs  10/06/14 1957 10/07/14 0029  TROPONINI 0.05* 0.05*   BNP Invalid input(s): POCBNP D-Dimer No results for input(s): DDIMER in the last 72 hours. Hemoglobin A1C No results for input(s): HGBA1C in the last 72 hours. Fasting Lipid Panel No results for input(s): CHOL, HDL, LDLCALC, TRIG, CHOLHDL, LDLDIRECT in the last 72 hours. Thyroid Function Tests  Recent Labs  10/06/14 0445  TSH 3.295    TELE   normal sinus rhythm  ECG    Radiology/Studies  Ct Abdomen Pelvis Wo Contrast  10/05/2014   CLINICAL DATA:  Upper abdominal pain since last Thursday. Nausea and vomiting.  EXAM: CT ABDOMEN AND PELVIS WITHOUT CONTRAST  TECHNIQUE: Multidetector CT imaging of the abdomen and pelvis was performed following the standard protocol without IV contrast.  COMPARISON:  10/01/2014  FINDINGS: There is bibasilar atelectasis.  There is a punctate nonobstructing left renal calculus. No obstructive uropathy. No perinephric stranding is seen. The kidneys are symmetric in size without evidence for exophytic mass. The bladder is unremarkable.  The liver demonstrates no focal abnormality. The gallbladder is distended with cholelithiasis, gallbladder wall thickening and pericholecystic inflammatory changes most consistent with  acute cholecystitis. The spleen demonstrates no focal abnormality. The adrenal glands and pancreas are normal.  There is a small hiatal hernia. The unopacified stomach, duodenum, small intestine and large intestine are unremarkable, but evaluation is limited by lack of oral contrast. There is no pneumoperitoneum, pneumatosis, or portal venous gas. There is no abdominal or pelvic free fluid. There is no lymphadenopathy.  The abdominal aorta is normal in caliber with atherosclerosis.  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 There is thoracolumbar spine spondylosis most severe at L3-4 and L4-5 with disc space narrowing and bilateral severe facet arthropathy.  IMPRESSION: 1. Cholelithiasis with gallbladder distention, gallbladder wall thickening and pericholecystic inflammatory changes most consistent with acute cholecystitis.   Electronically Signed   By: Elige Ko   On: 10/05/2014 19:08   Ct Angio Chest Pe W/cm &/or Wo Cm  10/01/2014   CLINICAL DATA:  Left upper quadrant pain for 3 hours with paleness and diaphoresis.  EXAM: CT ANGIOGRAPHY CHEST, ABDOMEN AND PELVIS  TECHNIQUE: Multidetector CT imaging through the chest, abdomen and pelvis was performed using the standard protocol during bolus administration of intravenous contrast. Multiplanar reconstructed images and MIPs were obtained and reviewed to evaluate the vascular anatomy.  CONTRAST:  OMNIPAQUE IOHEXOL 350 MG/ML SOLN  COMPARISON:  None.  FINDINGS: CTA CHEST FINDINGS  THORACIC INLET/BODY WALL:  No acute abnormality.  MEDIASTINUM:  Normal heart size. No pericardial effusion. There is coronary atherosclerosis which is diffuse throughout the left and right circulation. No aortic aneurysm, dissection, or intramural hematoma. Small sliding hiatal hernia. No evidence of pulmonary embolism.  LUNG WINDOWS:  No consolidation.  No effusion.  No suspicious pulmonary nodule.  OSSEOUS:  See below  Review of the MIP images confirms the above findings.  CTA  ABDOMEN AND PELVIS FINDINGS  BODY WALL: No contributory findings.  Liver: Hepatic steatosis.  No focal abnormality.  Biliary: Cholelithiasis. No evidence of biliary obstruction or inflammation.  Pancreas: Unremarkable.  Spleen: Unremarkable.  Adrenals: Unremarkable.  Kidneys and ureters: 2 punctate calculi in the lower pole left kidney, nonobstructive. No ureteral calculus or hydronephrosis  Bladder: Partly obscured by streak artifact from of the right hip prosthesis. No pathologic findings  Reproductive: Negative for age.  Bowel: Distal colonic diverticulosis. No bowel inflammation or obstruction. Negative appendix.  Retroperitoneum: No mass or adenopathy.  Peritoneum: No ascites or pneumoperitoneum.  Vascular: Standard aortic branching. No aneurysm, dissection, or wall thickening. There is scattered atherosclerotic calcification of the aorta and branch vessels without notable stenosis.  OSSEOUS: Right hip arthroplasty. There is near bridging bulky heterotopic ossification about the joint.  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 This is presumably posttraumatic given unilaterality.  Mid thoracic vertebral  body compression deformities appear chronic. There is advanced and diffuse spondylosis. 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  Review of the MIP images confirms the above findings.  IMPRESSION: 1. No acute findings, including aortic dissection. 2. Extensive coronary atherosclerosis. 3. Hepatic steatosis. 4. Cholelithiasis. 5. Additional chronic findings are noted above.   Electronically Signed   By: Marnee Spring M.D.   On: 10/01/2014 03:54   US Abdomen Complete  10/05/2014   CLINICAL DATA:  Acute onset of right upper quadrant pain 6 days ago. Symptoms are worsening.  EXAM: ULTRASOUND ABDOMEN COMPLETE  COMPARISON:  CT abdomen same day.  FINDINGS: Gallbladder: Gallstones dependent in the gallbladder. Mild wall thickening. Positive Murphy sign. Small amount of  adjacent fluid. Findings all consistent with acute cholecystitis.  Common bile duct: Diameter: 4 mm, normal  Liver: Diffuse fatty change.  No focal lesion seen.  IVC: No abnormality visualized.  Pancreas: Poorly seen because of overlying bowel gas.  Spleen: Poorly seen because of overlying bowel gas.  Right Kidney: Length: 11.0 cm. Echogenicity within normal limits. No mass or hydronephrosis visualized.  Left Kidney: Length: 10.9 cm. Echogenicity within normal limits. No mass or hydronephrosis visualized.  Abdominal aorta: No aneurysm visualized.  Other findings: None.  IMPRESSION: Consistent with acute cholecystitis. Gallstones. Wall thickening. Small amount appear cholecystic fluid. Positive Murphy sign.   Electronically Signed   By: Paulina Fusi M.D.   On: 10/05/2014 19:48   Dg Chest Port 1 View  10/06/2014   CLINICAL DATA:  Central line placement.  EXAM: PORTABLE CHEST - 1 VIEW  COMPARISON:  10/06/2014  FINDINGS: Interval left IJ central venous catheter placement. The tip projects over the region of the proximal SVC however I cannot exclude extension into the azygos vein. Prominent cardiomediastinal contours. Hypoaeration with interstitial and vascular crowding. Bibasilar opacities. Small effusions not excluded. No pneumothorax. Multilevel degenerative change. Right lateral approach percutaneous cholecystostomy tube.  IMPRESSION: Left IJ catheter tip projects over the proximal SVC however extension into the azygos vein is not excluded. Consider correlation with a lateral view.  Hypoaeration with interstitial and vascular crowding. Lung base opacities, favor atelectasis.   Electronically Signed   By: Jearld Lesch M.D.   On: 10/06/2014 22:33   Dg Chest Port 1 View  10/06/2014   CLINICAL DATA:  Shortness of breath, dyspnea, post percutaneous cholecystostomy earlier today, diabetes, hypertension, coronary artery disease post MI, atrial fibrillation  EXAM: PORTABLE CHEST - 1 VIEW  COMPARISON:  Portable exam  1648 hours compared to 10/01/2014  FINDINGS: Enlargement of cardiac silhouette.  Mediastinal contours and pulmonary vascularity normal.  Mild RIGHT basilar atelectasis, new.  Remaining lungs clear.  No pleural effusion or pneumothorax.  RIGHT glenohumeral degenerative changes and probable chronic rotator cuff tear.  IMPRESSION: Mild RIGHT basilar atelectasis.   Electronically Signed   By: Ulyses Southward M.D.   On: 10/06/2014 17:10   Dg Chest Portable 1 View  10/01/2014   CLINICAL DATA:  Abdominal pain, left upper quadrant  EXAM: PORTABLE CHEST - 1 VIEW  COMPARISON:  None currently available  FINDINGS: Normal heart size and mediastinal contours for technique. No acute infiltrate or edema. No effusion or pneumothorax. No acute osseous findings.  IMPRESSION: No active disease.   Electronically Signed   By: Marnee Spring M.D.   On: 10/01/2014 03:16   Ct Angio Abd/pel W/ And/or W/o  10/01/2014   CLINICAL DATA:  Left upper quadrant pain for 3 hours with paleness and diaphoresis.  EXAM: CT ANGIOGRAPHY CHEST, ABDOMEN AND PELVIS  TECHNIQUE: Multidetector CT imaging through the chest, abdomen and pelvis was performed using the standard protocol during bolus administration of intravenous contrast. Multiplanar reconstructed images and MIPs were obtained and reviewed to evaluate the vascular anatomy.  CONTRAST:  OMNIPAQUE IOHEXOL 350 MG/ML SOLN  COMPARISON:  None.  FINDINGS: CTA CHEST FINDINGS  THORACIC INLET/BODY WALL:  No acute abnormality.  MEDIASTINUM:  Normal heart size. No pericardial effusion. There is coronary atherosclerosis which is diffuse throughout the left and right circulation. No aortic aneurysm, dissection, or intramural hematoma. Small sliding hiatal hernia. No evidence of pulmonary embolism.  LUNG WINDOWS:  No consolidation.  No effusion.  No suspicious pulmonary nodule.  OSSEOUS:  See below  Review of the MIP images confirms the above findings.  CTA ABDOMEN AND PELVIS FINDINGS  BODY WALL: No  contributory findings.  Liver: Hepatic steatosis.  No focal abnormality.  Biliary: Cholelithiasis. No evidence of biliary obstruction or inflammation.  Pancreas: Unremarkable.  Spleen: Unremarkable.  Adrenals: Unremarkable.  Kidneys and ureters: 2 punctate calculi in the lower pole left kidney, nonobstructive. No ureteral calculus or hydronephrosis  Bladder: Partly obscured by streak artifact from of the right hip prosthesis. No pathologic findings  Reproductive: Negative for age.  Bowel: Distal colonic diverticulosis. No bowel inflammation or obstruction. Negative appendix.  Retroperitoneum: No mass or adenopathy.  Peritoneum: No ascites or pneumoperitoneum.  Vascular: Standard aortic branching. No aneurysm, dissection, or wall thickening. There is scattered atherosclerotic calcification of the aorta and branch vessels without notable stenosis.  OSSEOUS: Right hip arthroplasty. There is near bridging bulky heterotopic ossification about the joint.  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 This is presumably posttraumatic given unilaterality.  Mid thoracic vertebral body compression deformities appear chronic. There is advanced and diffuse spondylosis. 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  Review of the MIP images confirms the above findings.  IMPRESSION: 1. No acute findings, including aortic dissection. 2. Extensive coronary atherosclerosis. 3. Hepatic steatosis. 4. Cholelithiasis. 5. Additional chronic findings are noted above.   Electronically Signed   By: Marnee Spring M.D.   On: 10/01/2014 03:54    ASSESSMENT AND PLAN  1. Acute cholecystitis 2. Preoperative cardiac clearance for cholycystectomy. He appears stable from a cardiac standpoint. He has not had any recent angina or SOB. EKG shows inferior infarct with no acute ST changes. I do not have an old EKG after his Cath 2 years ago to compare to. I will try to get a copy of his EKG and cath report  from [facility]. He thinks he is able to walk 4 blocks without any anginal symptoms. He is moderate risk for surgery due to History of CAD but is not having any active exertional anginal symptoms.2-D echo is pending. 3. PAF maintaining NSR. He never got the Eliquis filled. Recommend restarting Eliquis after surgery. This patients CHA2DS2-VASc Score and unadjusted Ischemic Stroke Rate (% per year) is equal to 4 % stroke rate/year from a score of 4 Above score calculated as 1 point each if present [CHF, HTN, DM, Vascular=MI/PAD/Aortic Plaque, Age if 65-74, or Male] Above score calculated as 2 points each if present [Age > 75, or Stroke/TIA/TE] 4. HTN - now hypotensive most likely due to underlying infection. ACE I, diuretic and amlodipine on hold. on low-dose levophed. 5. 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 Continue ASA. 6. DM 7. Dyslipidemia on high dose statin.  Signed,  Cassell Clementhomas Dawnelle Warman MD

## 2014-10-07 NOTE — Progress Notes (Addendum)
ANTICOAGULATION CONSULT NOTE - Initial Consult  Pharmacy Consult for Heparin Indication: atrial fibrillation  No Known Allergies  Patient Measurements: Height:  (172.7 cm) Weight: 258 lb 6.1 oz (117.2 kg) IBW/kg (Calculated) : 68.4 Heparin Dosing Weight: 95 kg  Vital Signs: Temp: 97.4 F (36.3 C) (04/21 0730) Temp Source: Oral (04/21 0730) BP: 104/43 mmHg (04/21 1233) Pulse Rate: 69 (04/21 1233)  Labs:  Recent Labs  10/05/14 1640 10/06/14 0445 10/06/14 1957 10/07/14 0029 10/07/14 0558 10/07/14 1045  HGB 10.6* 9.5*  --   --  10.3*  --   HCT 30.3* 27.8*  --   --  30.4*  --   PLT 174 167  --   --  228  --   APTT  --  34  --   --   --   --   LABPROT  --  14.0  --   --   --   --   INR  --  1.07  --   --   --   --   CREATININE 2.33* 2.30*  --   --  2.88*  --   TROPONINI  --   --  0.05* 0.05*  --  0.04*    Estimated Creatinine Clearance: 30.5 mL/min (by C-G formula based on Cr of 2.88).   Medical History: Past Medical History  Diagnosis Date  . Diabetes mellitus without complication   . MI (myocardial infarction)   . Hypertension   . Atrial fibrillation 10-01-14  . Coronary artery disease   . Obesity     Medications:  Prescriptions prior to admission  Medication Sig Dispense Refill Last Dose  . acetaminophen-codeine (TYLENOL #3) 300-30 MG per tablet Take 1 tablet by mouth 2 (two) times daily.   Past Week at Unknown time  . allopurinol (ZYLOPRIM) 300 MG tablet Take 300 mg by mouth daily.   Past Week at Unknown time  . amLODipine (NORVASC) 10 MG tablet Take 10 mg by mouth daily.   Past Week at Unknown time  . apixaban (ELIQUIS) 5 MG TABS tablet Take 1 tablet (5 mg total) by mouth 2 (two) times daily. 60 tablet 0 Past Week at Unknown time  . aspirin 81 MG chewable tablet Chew 81 mg by mouth daily.   10/05/2014 at Unknown time  . atorvastatin (LIPITOR) 80 MG tablet Take 80 mg by mouth daily.   Past Week at Unknown time  . Cholecalciferol (VITAMIN D PO) Take 1  tablet by mouth daily.    Past Week at Unknown time  . citalopram (CELEXA) 40 MG tablet Take 40 mg by mouth daily.   10/05/2014 at Unknown time  . Cyanocobalamin (VITAMIN B 12 PO) Take 1 tablet by mouth daily.    Past Week at Unknown time  . furosemide (LASIX) 40 MG tablet Take 40 mg by mouth daily.    Past Week at Unknown time  . insulin glargine (LANTUS) 100 UNIT/ML injection Inject 40 Units into the skin at bedtime.   Past Week at Unknown time  . Liraglutide 18 MG/3ML SOPN Inject 1.2 mg into the skin daily.   Past Week at Unknown time  . lisinopril (PRINIVIL,ZESTRIL) 40 MG tablet Take 40 mg by mouth daily.   Past Week at Unknown time  . magnesium oxide (MAG-OX) 400 MG tablet Take 400 mg by mouth daily.   Past Week at Unknown time  . metFORMIN (GLUCOPHAGE) 500 MG tablet Take 500 mg by mouth 2 (two) times daily with a meal.  Past Week at Unknown time  . pantoprazole (PROTONIX) 40 MG tablet Take 40 mg by mouth daily.   Past Week at Unknown time  . sucralfate (CARAFATE) 1 GM/10ML suspension Take 10 mLs (1 g total) by mouth 4 (four) times daily -  with meals and at bedtime. 420 mL 0 Past Month at Unknown time   Scheduled:  . allopurinol  300 mg Oral Daily  . antiseptic oral rinse  7 mL Mouth Rinse q12n4p  . chlorhexidine  15 mL Mouth Rinse BID  . folic acid  1 mg Oral Daily  . heparin subcutaneous  5,000 Units Subcutaneous 3 times per day  . insulin aspart  2-6 Units Subcutaneous 6 times per day  . multivitamin with minerals  1 tablet Oral Daily  . pantoprazole  40 mg Oral Daily  . piperacillin-tazobactam (ZOSYN)  IV  3.375 g Intravenous 3 times per day  . thiamine  100 mg Oral Daily   Infusions:  . sodium chloride 10 mL/hr at 10/06/14 2322  . norepinephrine (LEVOPHED) Adult infusion 8 mcg/min (10/07/14 1034)   PRN: acetaminophen **OR** acetaminophen, HYDROmorphone (DILAUDID) injection, ondansetron **OR** ondansetron (ZOFRAN) IV, oxyCODONE   Assessment: 69 year old male admitted for  acute cholecystitis 4/19. Perc chole drain placed 4/20. Hypotensive post-procedurally to ICU for pressors.  New Afib seen on 4/15, was given prescription for Eliquis but never picked up.  Cleared by surgery/IR to resume anticoagulation, cardiology in agreement.  Pharmacy consulted to begin heparin IV with poor renal function, to transition to Eliquis when improved.   Baseline INR, aPTT: wnl  Prior anticoagulation: none  CHADS2-VASc = 4  Significant events: 4/20 - cholecystostomy placed  Today, 10/07/2014:  CBC: Hgb low but stable post-op; Plt wnl  No bleeding or infusion issues per nursing, cleared by IR/surgery  CrCl: 31 ml/min  Goal of Therapy: Heparin level 0.3-0.7 units/ml Monitor platelets by anticoagulation protocol: Yes  Plan:  Heparin 2000 units IV bolus x 1; will use lower bolus d/t poor renal function and recent surgery.  Heparin 1400 units/hr IV infusion  Check heparin level 8 hrs after start, given both age almost [age] and CrCl almost < 30  Daily CBC, daily heparin level once stable  Monitor for signs of bleeding or thrombosis  Follow for conversion to Eliquis   Bernadene Personrew Augusta Hilbert, PharmD Pager: [phone] 10/07/2014, 1:03 PM

## 2014-10-08 LAB — HEPARIN LEVEL (UNFRACTIONATED)
HEPARIN UNFRACTIONATED: 0.24 [IU]/mL — AB (ref 0.30–0.70)
Heparin Unfractionated: 0.19 IU/mL — ABNORMAL LOW (ref 0.30–0.70)

## 2014-10-08 LAB — GLUCOSE, CAPILLARY
GLUCOSE-CAPILLARY: 119 mg/dL — AB (ref 70–99)
GLUCOSE-CAPILLARY: 126 mg/dL — AB (ref 70–99)
GLUCOSE-CAPILLARY: 166 mg/dL — AB (ref 70–99)
GLUCOSE-CAPILLARY: 177 mg/dL — AB (ref 70–99)
GLUCOSE-CAPILLARY: 186 mg/dL — AB (ref 70–99)
GLUCOSE-CAPILLARY: 193 mg/dL — AB (ref 70–99)
GLUCOSE-CAPILLARY: 201 mg/dL — AB (ref 70–99)
Glucose-Capillary: 112 mg/dL — ABNORMAL HIGH (ref 70–99)
Glucose-Capillary: 114 mg/dL — ABNORMAL HIGH (ref 70–99)
Glucose-Capillary: 126 mg/dL — ABNORMAL HIGH (ref 70–99)
Glucose-Capillary: 149 mg/dL — ABNORMAL HIGH (ref 70–99)
Glucose-Capillary: 173 mg/dL — ABNORMAL HIGH (ref 70–99)
Glucose-Capillary: 182 mg/dL — ABNORMAL HIGH (ref 70–99)
Glucose-Capillary: 88 mg/dL (ref 70–99)

## 2014-10-08 LAB — URINALYSIS, ROUTINE W REFLEX MICROSCOPIC
BILIRUBIN URINE: NEGATIVE
Glucose, UA: NEGATIVE mg/dL
HGB URINE DIPSTICK: NEGATIVE
Ketones, ur: NEGATIVE mg/dL
Leukocytes, UA: NEGATIVE
NITRITE: NEGATIVE
Protein, ur: NEGATIVE mg/dL
SPECIFIC GRAVITY, URINE: 1.015 (ref 1.005–1.030)
Urobilinogen, UA: 0.2 mg/dL (ref 0.0–1.0)
pH: 5 (ref 5.0–8.0)

## 2014-10-08 LAB — COMPREHENSIVE METABOLIC PANEL
ALT: 27 U/L (ref 0–53)
ANION GAP: 11 (ref 5–15)
AST: 29 U/L (ref 0–37)
Albumin: 2.4 g/dL — ABNORMAL LOW (ref 3.5–5.2)
Alkaline Phosphatase: 72 U/L (ref 39–117)
BILIRUBIN TOTAL: 1.1 mg/dL (ref 0.3–1.2)
BUN: 48 mg/dL — ABNORMAL HIGH (ref 6–23)
CHLORIDE: 102 mmol/L (ref 96–112)
CO2: 24 mmol/L (ref 19–32)
Calcium: 8 mg/dL — ABNORMAL LOW (ref 8.4–10.5)
Creatinine, Ser: 2.04 mg/dL — ABNORMAL HIGH (ref 0.50–1.35)
GFR calc non Af Amer: 32 mL/min — ABNORMAL LOW (ref >90)
GFR, EST AFRICAN AMERICAN: 37 mL/min — AB (ref >90)
Glucose, Bld: 203 mg/dL — ABNORMAL HIGH (ref 70–99)
POTASSIUM: 4 mmol/L (ref 3.5–5.1)
SODIUM: 137 mmol/L (ref 135–145)
TOTAL PROTEIN: 5.8 g/dL — AB (ref 6.0–8.3)

## 2014-10-08 LAB — CBC
HEMATOCRIT: 27.3 % — AB (ref 39.0–52.0)
Hemoglobin: 9.4 g/dL — ABNORMAL LOW (ref 13.0–17.0)
MCH: 31.8 pg (ref 26.0–34.0)
MCHC: 34.4 g/dL (ref 30.0–36.0)
MCV: 92.2 fL (ref 78.0–100.0)
Platelets: 254 10*3/uL (ref 150–400)
RBC: 2.96 MIL/uL — ABNORMAL LOW (ref 4.22–5.81)
RDW: 16.3 % — ABNORMAL HIGH (ref 11.5–15.5)
WBC: 11.7 10*3/uL — AB (ref 4.0–10.5)

## 2014-10-08 LAB — MAGNESIUM: Magnesium: 2 mg/dL (ref 1.5–2.5)

## 2014-10-08 LAB — PHOSPHORUS: Phosphorus: 3.2 mg/dL (ref 2.3–4.6)

## 2014-10-08 MED ORDER — INSULIN GLARGINE 100 UNIT/ML ~~LOC~~ SOLN: 40.0000 [IU] | Freq: Every day | SUBCUTANEOUS | Status: DC

## 2014-10-08 MED ORDER — HEPARIN BOLUS VIA INFUSION
1500.0000 [IU] | Freq: Once | INTRAVENOUS | Status: AC
  Administered 2014-10-08: 1500 [IU] via INTRAVENOUS
  Filled 2014-10-08: qty 1500

## 2014-10-08 MED ORDER — HEPARIN BOLUS VIA INFUSION
1000.0000 [IU] | Freq: Once | INTRAVENOUS | Status: AC
  Administered 2014-10-08: 1000 [IU] via INTRAVENOUS
  Filled 2014-10-08: qty 1000

## 2014-10-08 MED ORDER — HEPARIN (PORCINE) IN NACL 100-0.45 UNIT/ML-% IJ SOLN
2250.0000 [IU]/h | INTRAMUSCULAR | Status: DC
  Administered 2014-10-08: 2000 [IU]/h via INTRAVENOUS
  Administered 2014-10-09: 2250 [IU]/h via INTRAVENOUS
  Filled 2014-10-08 (×3): qty 250

## 2014-10-08 MED ORDER — HEPARIN BOLUS VIA INFUSION
2000.0000 [IU] | Freq: Once | INTRAVENOUS | Status: AC
  Administered 2014-10-08: 2000 [IU] via INTRAVENOUS
  Filled 2014-10-08: qty 2000

## 2014-10-08 MED ORDER — INSULIN ASPART 100 UNIT/ML ~~LOC~~ SOLN
0.0000 [IU] | Freq: Every day | SUBCUTANEOUS | Status: DC
  Administered 2014-10-09: 1 [IU] via SUBCUTANEOUS

## 2014-10-08 MED ORDER — INSULIN ASPART 100 UNIT/ML ~~LOC~~ SOLN
0.0000 [IU] | Freq: Three times a day (TID) | SUBCUTANEOUS | Status: DC
  Administered 2014-10-09 – 2014-10-10 (×4): 1 [IU] via SUBCUTANEOUS
  Administered 2014-10-10 – 2014-10-11 (×3): 2 [IU] via SUBCUTANEOUS

## 2014-10-08 MED ORDER — HEPARIN (PORCINE) IN NACL 100-0.45 UNIT/ML-% IJ SOLN
1800.0000 [IU]/h | INTRAMUSCULAR | Status: DC
  Administered 2014-10-08: 1800 [IU]/h via INTRAVENOUS
  Filled 2014-10-08 (×3): qty 250

## 2014-10-08 MED ORDER — INSULIN GLARGINE 100 UNIT/ML ~~LOC~~ SOLN
40.0000 [IU] | Freq: Every day | SUBCUTANEOUS | Status: DC
  Administered 2014-10-08 – 2014-10-11 (×4): 40 [IU] via SUBCUTANEOUS
  Filled 2014-10-08 (×4): qty 0.4

## 2014-10-08 NOTE — Progress Notes (Signed)
ANTICOAGULATION CONSULT NOTE - Follow Up Consult  Pharmacy Consult for Heparin Indication: atrial fibrillation (Eliquis PTA)  No Known Allergies  Patient Measurements: Height: 5\' 8"  (172.7 cm) Weight: 264 lb 1.8 oz (119.8 kg) IBW/kg (Calculated) : 68.4 Heparin Dosing Weight: 96 kg  Vital Signs: Temp: 97.7 F (36.5 C) (04/22 0800) Temp Source: Oral (04/22 0800) BP: 148/60 mmHg (04/22 0700)  Labs:  Recent Labs  10/06/14 0445 10/06/14 1957 10/07/14 0029 10/07/14 0558 10/07/14 1045 10/07/14 1900 10/07/14 2155 10/08/14 0530  HGB 9.5*  --   --  10.3*  --   --   --  9.4*  HCT 27.8*  27.7*  --   --  30.4*  --   --   --  27.3*  PLT 167  --   --  228  --   --   --  254  APTT 34  --   --   --   --   --   --   --   LABPROT 14.0  --   --   --   --   --   --   --   INR 1.07  --   --   --   --   --   --   --   HEPARINUNFRC  --   --   --   --   --   --  <0.10*  --   CREATININE 2.30*  --   --  2.88*  --  2.42*  --  2.04*  TROPONINI  --  0.05* 0.05*  --  0.04*  --   --   --     Estimated Creatinine Clearance: 43.6 mL/min (by C-G formula based on Cr of 2.04).   Medications:  Infusions:  . sodium chloride 10 mL/hr at 10/06/14 2322  . sodium chloride 50 mL/hr at 10/07/14 1822  . dextrose 5 % and 0.45% NaCl 50 mL/hr at 10/07/14 2000  . heparin 1,800 Units/hr (10/08/14 0425)  . insulin (NOVOLIN-R) infusion 5.6 Units/hr (10/08/14 0809)  . norepinephrine (LEVOPHED) Adult infusion 6 mcg/min (10/08/14 [id number])    Assessment: 69 year old male admitted for acute cholecystitis 4/19, s/p perc chole drain placement 4/20.  PMH includes new Afib seen on 4/15, prescribed Eliquis but never picked up. He has been cleared by surgery/IR to resume anticoagulation post-op.  Pharmacy consulted to begin heparin IV with poor renal function, to transition to Eliquis when improved.   Heparin level: 0.19, remains SUBtherpeutic, but increased.  CBC: Hgb decreased to 9.4, Plt stable/WNL  No bleeding or  complications reported.  SCr improved with CrCl ~ 44 ml/min.   Goal of Therapy:  Heparin level 0.3-0.7 units/ml Monitor platelets by anticoagulation protocol: Yes   Plan:   Give heparin 1000 units bolus IV x 1  Increase to heparin IV infusion at 2000 units/hr  Heparin level 6 hours after rate change  Daily heparin level and CBC  Follow up long-term anticoagulation plans, resume Eliquis when stable.  Lynann Beaverhristine Seab Axel PharmD, BCPS Pager [phone] 10/08/2014 9:16 AM

## 2014-10-08 NOTE — Progress Notes (Signed)
Patient ID: Larry [name], male   DOB: Apr 28, 1946, 69 y.o.   MRN: [id number]    Referring Physician(s): CCS   Subjective:  Pt doing ok today; does have some mild RUQ discomfort especially when transitioning back to bed after going to bathroom; denies N/V  Allergies: Review of patient's allergies indicates no known allergies.  Medications: Prior to Admission medications   Medication Sig Start Date End Date Taking? Authorizing Provider  acetaminophen-codeine (TYLENOL #3) 300-30 MG per tablet Take 1 tablet by mouth 2 (two) times daily.   Yes Historical Provider, MD  allopurinol (ZYLOPRIM) 300 MG tablet Take 300 mg by mouth daily.   Yes Historical Provider, MD  amLODipine (NORVASC) 10 MG tablet Take 10 mg by mouth daily.   Yes Historical Provider, MD  apixaban (ELIQUIS) 5 MG TABS tablet Take 1 tablet (5 mg total) by mouth 2 (two) times daily. 10/01/14  Yes April Palumbo, MD  aspirin 81 MG chewable tablet Chew 81 mg by mouth daily.   Yes Historical Provider, MD  atorvastatin (LIPITOR) 80 MG tablet Take 80 mg by mouth daily.   Yes Historical Provider, MD  Cholecalciferol (VITAMIN D PO) Take 1 tablet by mouth daily.    Yes Historical Provider, MD  citalopram (CELEXA) 40 MG tablet Take 40 mg by mouth daily.   Yes Historical Provider, MD  Cyanocobalamin (VITAMIN B 12 PO) Take 1 tablet by mouth daily.    Yes Historical Provider, MD  furosemide (LASIX) 40 MG tablet Take 40 mg by mouth daily.    Yes Historical Provider, MD  insulin glargine (LANTUS) 100 UNIT/ML injection Inject 40 Units into the skin at bedtime.   Yes Historical Provider, MD  Liraglutide 18 MG/3ML SOPN Inject 1.2 mg into the skin daily.   Yes Historical Provider, MD  lisinopril (PRINIVIL,ZESTRIL) 40 MG tablet Take 40 mg by mouth daily.   Yes Historical Provider, MD  magnesium oxide (MAG-OX) 400 MG tablet Take 400 mg by mouth daily.   Yes Historical Provider, MD  metFORMIN (GLUCOPHAGE) 500 MG tablet Take 500 mg by mouth 2 (two)  times daily with a meal.   Yes Historical Provider, MD  pantoprazole (PROTONIX) 40 MG tablet Take 40 mg by mouth daily.   Yes Historical Provider, MD  sucralfate (CARAFATE) 1 GM/10ML suspension Take 10 mLs (1 g total) by mouth 4 (four) times daily -  with meals and at bedtime. 10/01/14  Yes April Palumbo, MD     Vital Signs: BP 128/66 mmHg  Pulse 81  Temp(Src) 97.7 F (36.5 C) (Oral)  Resp 16  Ht  (1.727 m)  Wt 264 lb 1.8 oz (119.8 kg)  BMI 40.17 kg/m2  SpO2 100%  Physical Exam pt awake/alert; GB drain intact, insertion site ok, mildly tender; output 345 cc's; bile cx's pending  Imaging: Ct Abdomen Pelvis Wo Contrast  10/05/2014   CLINICAL DATA:  Upper abdominal pain since last Thursday. Nausea and vomiting.  EXAM: CT ABDOMEN AND PELVIS WITHOUT CONTRAST  TECHNIQUE: Multidetector CT imaging of the abdomen and pelvis was performed following the standard protocol without IV contrast.  COMPARISON:  10/01/2014  FINDINGS: There is bibasilar atelectasis.  There is a punctate nonobstructing left renal calculus. No obstructive uropathy. No perinephric stranding is seen. The kidneys are symmetric in size without evidence for exophytic mass. The bladder is unremarkable.  The liver demonstrates no focal abnormality. The gallbladder is distended with cholelithiasis, gallbladder wall thickening and pericholecystic inflammatory changes most consistent with acute cholecystitis. The spleen  demonstrates no focal abnormality. The adrenal glands and pancreas are normal.  There is a small hiatal hernia. The unopacified stomach, duodenum, small intestine and large intestine are unremarkable, but evaluation is limited by lack of oral contrast. There is no pneumoperitoneum, pneumatosis, or portal venous gas. There is no abdominal or pelvic free fluid. There is no lymphadenopathy.  The abdominal aorta is normal in caliber with atherosclerosis.  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 There is thoracolumbar spine  spondylosis most severe at L3-4 and L4-5 with disc space narrowing and bilateral severe facet arthropathy.  IMPRESSION: 1. Cholelithiasis with gallbladder distention, gallbladder wall thickening and pericholecystic inflammatory changes most consistent with acute cholecystitis.   Electronically Signed   By: Elige Ko   On: 10/05/2014 19:08   US Abdomen Complete  10/05/2014   CLINICAL DATA:  Acute onset of right upper quadrant pain 6 days ago. Symptoms are worsening.  EXAM: ULTRASOUND ABDOMEN COMPLETE  COMPARISON:  CT abdomen same day.  FINDINGS: Gallbladder: Gallstones dependent in the gallbladder. Mild wall thickening. Positive Murphy sign. Small amount of adjacent fluid. Findings all consistent with acute cholecystitis.  Common bile duct: Diameter: 4 mm, normal  Liver: Diffuse fatty change.  No focal lesion seen.  IVC: No abnormality visualized.  Pancreas: Poorly seen because of overlying bowel gas.  Spleen: Poorly seen because of overlying bowel gas.  Right Kidney: Length: 11.0 cm. Echogenicity within normal limits. No mass or hydronephrosis visualized.  Left Kidney: Length: 10.9 cm. Echogenicity within normal limits. No mass or hydronephrosis visualized.  Abdominal aorta: No aneurysm visualized.  Other findings: None.  IMPRESSION: Consistent with acute cholecystitis. Gallstones. Wall thickening. Small amount appear cholecystic fluid. Positive Murphy sign.   Electronically Signed   By: Paulina Fusi M.D.   On: 10/05/2014 19:48   Ir Perc Cholecystostomy  10/07/2014   CLINICAL DATA:  Acute cholecystitis with sepsis  EXAM: CHOLECYSTOSTOMY  FLUOROSCOPY TIME:  48 seconds.  MEDICATIONS AND MEDICAL HISTORY: Versed 0 mg, Fentanyl 0 mcg.  Additional Medications: None  ANESTHESIA/SEDATION: None  CONTRAST:  5 cc Omnipaque 300  PROCEDURE: The procedure, risks, benefits, and alternatives were explained to the patient. Questions regarding the procedure were encouraged and answered. The patient understands and consents  to the procedure.  The right upper quadrant was prepped with Betadine in a sterile fashion, and a sterile drape was applied covering the operative field. A sterile gown and sterile gloves were used for the procedure.  A 21 gauge needle was inserted into the gallbladder lumen under sonographic guidance and via transhepatic approach. It was removed over a 018 wire, which was upsized to a 3-J. A 10-French drain was advanced over the wire and coiled in the gallbladder lumen. It was sewn to the skin after being string fixed.  FINDINGS: The cystic duct is occluded.  COMPLICATIONS: None  IMPRESSION: Successful cholecystostomy. This needs to remain in place at least 6 weeks.   Electronically Signed   By: Jolaine Click M.D.   On: 10/07/2014 08:44   Dg Chest Port 1 View  10/06/2014   CLINICAL DATA:  Central line placement.  EXAM: PORTABLE CHEST - 1 VIEW  COMPARISON:  10/06/2014  FINDINGS: Interval left IJ central venous catheter placement. The tip projects over the region of the proximal SVC however I cannot exclude extension into the azygos vein. Prominent cardiomediastinal contours. Hypoaeration with interstitial and vascular crowding. Bibasilar opacities. Small effusions not excluded. No pneumothorax. Multilevel degenerative change. Right lateral approach percutaneous cholecystostomy tube.  IMPRESSION: Left IJ catheter tip projects over the proximal SVC however extension into the azygos vein is not excluded. Consider correlation with a lateral view.  Hypoaeration with interstitial and vascular crowding. Lung base opacities, favor atelectasis.   Electronically Signed   By: Jearld LeschAndrew  DelGaizo M.D.   On: 10/06/2014 22:33   Dg Chest Port 1 View  10/06/2014   CLINICAL DATA:  Shortness of breath, dyspnea, post percutaneous cholecystostomy earlier today, diabetes, hypertension, coronary artery disease post MI, atrial fibrillation  EXAM: PORTABLE CHEST - 1 VIEW  COMPARISON:  Portable exam 1648 hours compared to 10/01/2014   FINDINGS: Enlargement of cardiac silhouette.  Mediastinal contours and pulmonary vascularity normal.  Mild RIGHT basilar atelectasis, new.  Remaining lungs clear.  No pleural effusion or pneumothorax.  RIGHT glenohumeral degenerative changes and probable chronic rotator cuff tear.  IMPRESSION: Mild RIGHT basilar atelectasis.   Electronically Signed   By: Ulyses SouthwardMark  Boles M.D.   On: 10/06/2014 17:10    Labs:  CBC:  Recent Labs  10/05/14 1640 10/06/14 0445 10/07/14 0558 10/08/14 0530  WBC 11.7* 10.5 16.9* 11.7*  HGB 10.6* 9.5* 10.3* 9.4*  HCT 30.3* 27.8*  27.7* 30.4* 27.3*  PLT 174 167 228 254    COAGS:  Recent Labs  10/06/14 0445  INR 1.07  APTT 34    BMP:  Recent Labs  10/06/14 0445 10/07/14 0558 10/07/14 1900 10/08/14 0530  NA 137 134* 134* 137  K 3.5 4.4 3.5 4.0  CL 103 102 101 102  CO2 24 22 24 24   GLUCOSE 120* 222* 213* 203*  BUN 52* 57* 52* 48*  CALCIUM 8.0* 7.7* 7.7* 8.0*  CREATININE 2.30* 2.88* 2.42* 2.04*  GFRNONAA 28* 21* 26* 32*  GFRAA 32* 24* 30* 37*    LIVER FUNCTION TESTS:  Recent Labs  10/05/14 1640 10/06/14 0445 10/07/14 0558 10/08/14 0530  BILITOT 2.8* 2.5* 2.4* 1.1  AST 34 34 50* 29  ALT 25 26 35 27  ALKPHOS 76 74 87 72  PROT 6.6 6.0 6.3 5.8*  ALBUMIN 3.0* 2.7* 2.6* 2.4*    Assessment and Plan: Acute cholecystitis, status post percutaneous cholecystostomy 4/20; afebrile; WBC 11.7(16.9); HGB 9.4, creat 2.04(2.42), t bili 1.1;  check bile cx's; cont current tx; further plans as per CCS/CCM/IM   Signed: Leotis Isham,D KEVIN 10/08/2014, 11:10 AM   I spent a total of 15 minutes in face to face in clinical consultation/evaluation, greater than 50% of which was counseling/coordinating care for perc cholecystostomy

## 2014-10-08 NOTE — Progress Notes (Signed)
ANTICOAGULATION CONSULT NOTE - Follow Up Consult  Pharmacy Consult for Heparin Indication: atrial fibrillation (Eliquis PTA)  No Known Allergies  Patient Measurements: Height: 5\' 8"  (172.7 cm) Weight: 264 lb 1.8 oz (119.8 kg) IBW/kg (Calculated) : 68.4 Heparin Dosing Weight: 96 kg  Vital Signs: Temp: 97.7 F (36.5 C) (04/22 1936) Temp Source: Oral (04/22 1936) BP: 118/53 mmHg (04/22 1730)  Labs:  Recent Labs  10/06/14 0445 10/06/14 1957 10/07/14 0029 10/07/14 0558 10/07/14 1045 10/07/14 1900 10/07/14 2155 10/08/14 0530 10/08/14 1110 10/08/14 2027  HGB 9.5*  --   --  10.3*  --   --   --  9.4*  --   --   HCT 27.8*  27.7*  --   --  30.4*  --   --   --  27.3*  --   --   PLT 167  --   --  228  --   --   --  254  --   --   APTT 34  --   --   --   --   --   --   --   --   --   LABPROT 14.0  --   --   --   --   --   --   --   --   --   INR 1.07  --   --   --   --   --   --   --   --   --   HEPARINUNFRC  --   --   --   --   --   --  <0.10*  --  0.19* 0.24*  CREATININE 2.30*  --   --  2.88*  --  2.42*  --  2.04*  --   --   TROPONINI  --  0.05* 0.05*  --  0.04*  --   --   --   --   --    Estimated Creatinine Clearance: 43.6 mL/min (by C-G formula based on Cr of 2.04).  Medications:  Infusions:  . sodium chloride 10 mL/hr at 10/06/14 2322  . sodium chloride 50 mL/hr at 10/07/14 1822  . heparin 2,000 Units/hr (10/08/14 1815)  . norepinephrine (LEVOPHED) Adult infusion Stopped (10/08/14 1211)   Assessment: 69 year old male admitted for acute cholecystitis 4/19, s/p perc chole drain placement 4/20.  PMH includes new Afib seen on 4/15, prescribed Eliquis but never picked up. He has been cleared by surgery/IR to resume anticoagulation post-op.  Pharmacy consulted to begin heparin IV with poor renal function, to transition to Eliquis when improved.   Heparin level: 0.24, remains SUBtherapeutic, but increasing  CBC: Hgb decreased to 9.4, Plt stable/WNL  No bleeding or  complications reported.  SCr improved with CrCl ~ 44 ml/min.  Goal of Therapy:  Heparin level 0.3-0.7 units/ml Monitor platelets by anticoagulation protocol: Yes   Plan:   Give heparin 1500 units bolus IV x 1  Increase to heparin IV infusion at 2250 units/hr  Heparin level 6 hours after rate change  Daily heparin level and CBC  Follow up long-term anticoagulation plans, resume Eliquis when stable.  Otho BellowsGreen, Nasim Garofano L PharmD Pager [phone] 10/08/2014, 9:32 PM

## 2014-10-08 NOTE — Progress Notes (Signed)
Patient Name: Larry [name] Date of Encounter: 10/08/2014     Principal Problem:   Cholecystitis Active Problems:   Diabetes mellitus without complication   HTN (hypertension)   Hypotension   Anemia   CAD (coronary artery disease)   Acute cholecystitis   Dyspnea   Septic shock   Paroxysmal atrial fibrillation    SUBJECTIVE  The patient has been stable overnight.  Remains in normal sinus rhythm.  No chest pain or shortness of breath.  Cholecystostomy tube in place.Blood pressure improved.  CURRENT MEDS . allopurinol  300 mg Oral Daily  . antiseptic oral rinse  7 mL Mouth Rinse q12n4p  . chlorhexidine  15 mL Mouth Rinse BID  . folic acid  1 mg Oral Daily  . insulin glargine  20 Units Subcutaneous QHS  . insulin regular  0-10 Units Intravenous TID WC  . multivitamin with minerals  1 tablet Oral Daily  . pantoprazole  40 mg Oral Daily  . piperacillin-tazobactam (ZOSYN)  IV  3.375 g Intravenous 3 times per day  . thiamine  100 mg Oral Daily    OBJECTIVE  Filed Vitals:   10/08/14 0400 10/08/14 0500 10/08/14 0600 10/08/14 0700  BP: 114/59 117/53 136/56 148/60  Pulse:      Temp:      TempSrc:      Resp: 22 17 16 19   Height:      Weight:  264 lb 1.8 oz (119.8 kg)    SpO2: 100% 100% 100% 100%    Intake/Output Summary (Last 24 hours) at 10/08/14 0710 Last data filed at 10/08/14 8295  Gross per 24 hour  Intake 2396.5 ml  Output   1545 ml  Net  851.5 ml   Filed Weights   10/05/14 1613 10/06/14 1900 10/08/14 0500  Weight: 260 lb (117.935 kg) 258 lb 6.1 oz (117.2 kg) 264 lb 1.8 oz (119.8 kg)    PHYSICAL EXAM  General: Pleasant, NAD.  Internal jugular catheters left neck Neuro: Alert and oriented X 3. Moves all extremities spontaneously. Psych: Normal affect. HEENT:  Normal  Neck: Supple without bruits or JVD. Lungs:  Resp regular and unlabored, CTA. Heart: RRR no s3, s4, or murmurs. Abdomen: Soft, non-tender, non-distended, BS + x 4.  Extremities: No  clubbing, cyanosis or edema. DP/PT/Radials 2+ and equal bilaterally.  Accessory Clinical Findings  CBC  Recent Labs  10/05/14 1640  10/07/14 0558 10/08/14 0530  WBC 11.7*  < > 16.9* 11.7*  NEUTROABS 9.7*  --   --   --   HGB 10.6*  < > 10.3* 9.4*  HCT 30.3*  < > 30.4* 27.3*  MCV 89.1  < > 92.7 92.2  PLT 174  < > 228 254  < > = values in this interval not displayed. Basic Metabolic Panel  Recent Labs  10/07/14 1900 10/08/14 0530  NA 134* PENDING  K 3.5 PENDING  CL 101 PENDING  CO2 24 PENDING  GLUCOSE 213* 203*  BUN 52* 48*  CREATININE 2.42* 2.04*  CALCIUM 7.7* PENDING  MG  --  2.0  PHOS  --  3.2   Liver Function Tests  Recent Labs  10/07/14 0558 10/08/14 0530  AST 50* 29  ALT 35 27  ALKPHOS 87 72  BILITOT 2.4* 1.1  PROT 6.3 5.8*  ALBUMIN 2.6* 2.4*    Recent Labs  10/05/14 1640  LIPASE 25   Cardiac Enzymes  Recent Labs  10/06/14 1957 10/07/14 0029 10/07/14 1045  TROPONINI 0.05* 0.05* 0.04*  BNP Invalid input(s): POCBNP D-Dimer No results for input(s): DDIMER in the last 72 hours. Hemoglobin A1C  Recent Labs  10/06/14 0445  HGBA1C 8.2*   Fasting Lipid Panel No results for input(s): CHOL, HDL, LDLCALC, TRIG, CHOLHDL, LDLDIRECT in the last 72 hours. Thyroid Function Tests  Recent Labs  10/06/14 0445  TSH 3.295    TELE   normal sinus rhythm  2D echo - Left ventricle: The cavity size was normal. Systolic function was normal. The estimated ejection fraction was in the range of 55% to 60%. Wall motion was normal; there were no regional wall motion abnormalities. - Mitral valve: There was mild regurgitation. - Left atrium: The atrium was mildly dilated. - Right ventricle: The cavity size was dilated. Wall thickness was normal. - Right atrium: The atrium was mildly to moderately dilated.   Radiology/Studies  Ct Abdomen Pelvis Wo Contrast  10/05/2014   CLINICAL DATA:  Upper abdominal pain since last Thursday. Nausea  and vomiting.  EXAM: CT ABDOMEN AND PELVIS WITHOUT CONTRAST  TECHNIQUE: Multidetector CT imaging of the abdomen and pelvis was performed following the standard protocol without IV contrast.  COMPARISON:  10/01/2014  FINDINGS: There is bibasilar atelectasis.  There is a punctate nonobstructing left renal calculus. No obstructive uropathy. No perinephric stranding is seen. The kidneys are symmetric in size without evidence for exophytic mass. The bladder is unremarkable.  The liver demonstrates no focal abnormality. The gallbladder is distended with cholelithiasis, gallbladder wall thickening and pericholecystic inflammatory changes most consistent with acute cholecystitis. The spleen demonstrates no focal abnormality. The adrenal glands and pancreas are normal.  There is a small hiatal hernia. The unopacified stomach, duodenum, small intestine and large intestine are unremarkable, but evaluation is limited by lack of oral contrast. There is no pneumoperitoneum, pneumatosis, or portal venous gas. There is no abdominal or pelvic free fluid. There is no lymphadenopathy.  The abdominal aorta is normal in caliber with atherosclerosis.  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 There is thoracolumbar spine spondylosis most severe at L3-4 and L4-5 with disc space narrowing and bilateral severe facet arthropathy.  IMPRESSION: 1. Cholelithiasis with gallbladder distention, gallbladder wall thickening and pericholecystic inflammatory changes most consistent with acute cholecystitis.   Electronically Signed   By: Elige Ko   On: 10/05/2014 19:08   Ct Angio Chest Pe W/cm &/or Wo Cm  10/01/2014   CLINICAL DATA:  Left upper quadrant pain for 3 hours with paleness and diaphoresis.  EXAM: CT ANGIOGRAPHY CHEST, ABDOMEN AND PELVIS  TECHNIQUE: Multidetector CT imaging through the chest, abdomen and pelvis was performed using the standard protocol during bolus administration of intravenous contrast. Multiplanar reconstructed images  and MIPs were obtained and reviewed to evaluate the vascular anatomy.  CONTRAST:  OMNIPAQUE IOHEXOL 350 MG/ML SOLN  COMPARISON:  None.  FINDINGS: CTA CHEST FINDINGS  THORACIC INLET/BODY WALL:  No acute abnormality.  MEDIASTINUM:  Normal heart size. No pericardial effusion. There is coronary atherosclerosis which is diffuse throughout the left and right circulation. No aortic aneurysm, dissection, or intramural hematoma. Small sliding hiatal hernia. No evidence of pulmonary embolism.  LUNG WINDOWS:  No consolidation.  No effusion.  No suspicious pulmonary nodule.  OSSEOUS:  See below  Review of the MIP images confirms the above findings.  CTA ABDOMEN AND PELVIS FINDINGS  BODY WALL: No contributory findings.  Liver: Hepatic steatosis.  No focal abnormality.  Biliary: Cholelithiasis. No evidence of biliary obstruction or inflammation.  Pancreas: Unremarkable.  Spleen: Unremarkable.  Adrenals:  Unremarkable.  Kidneys and ureters: 2 punctate calculi in the lower pole left kidney, nonobstructive. No ureteral calculus or hydronephrosis  Bladder: Partly obscured by streak artifact from of the right hip prosthesis. No pathologic findings  Reproductive: Negative for age.  Bowel: Distal colonic diverticulosis. No bowel inflammation or obstruction. Negative appendix.  Retroperitoneum: No mass or adenopathy.  Peritoneum: No ascites or pneumoperitoneum.  Vascular: Standard aortic branching. No aneurysm, dissection, or wall thickening. There is scattered atherosclerotic calcification of the aorta and branch vessels without notable stenosis.  OSSEOUS: Right hip arthroplasty. There is near bridging bulky heterotopic ossification about the joint.  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 This is presumably posttraumatic given unilaterality.  Mid thoracic vertebral body compression deformities appear chronic. There is advanced and diffuse spondylosis. Advanced lumbar facet osteoarthritis with L4-5 slip and foraminal  stenosis.  Review of the MIP images confirms the above findings.  IMPRESSION: 1. No acute findings, including aortic dissection. 2. Extensive coronary atherosclerosis. 3. Hepatic steatosis. 4. Cholelithiasis. 5. Additional chronic findings are noted above.   Electronically Signed   By: Marnee SpringJonathon  Watts M.D.   On: 10/01/2014 03:54   [place] Abdomen Complete  10/05/2014   CLINICAL DATA:  Acute onset of right upper quadrant pain 6 days ago. Symptoms are worsening.  EXAM: ULTRASOUND ABDOMEN COMPLETE  COMPARISON:  CT abdomen same day.  FINDINGS: Gallbladder: Gallstones dependent in the gallbladder. Mild wall thickening. Positive Murphy sign. Small amount of adjacent fluid. Findings all consistent with acute cholecystitis.  Common bile duct: Diameter: 4 mm, normal  Liver: Diffuse fatty change.  No focal lesion seen.  IVC: No abnormality visualized.  Pancreas: Poorly seen because of overlying bowel gas.  Spleen: Poorly seen because of overlying bowel gas.  Right Kidney: Length: 11.0 cm. Echogenicity within normal limits. No mass or hydronephrosis visualized.  Left Kidney: Length: 10.9 cm. Echogenicity within normal limits. No mass or hydronephrosis visualized.  Abdominal aorta: No aneurysm visualized.  Other findings: None.  IMPRESSION: Consistent with acute cholecystitis. Gallstones. Wall thickening. Small amount appear cholecystic fluid. Positive Murphy sign.   Electronically Signed   By: Paulina FusiMark  Shogry M.D.   On: 10/05/2014 19:48   Ir Perc Cholecystostomy  10/07/2014   CLINICAL DATA:  Acute cholecystitis with sepsis  EXAM: CHOLECYSTOSTOMY  FLUOROSCOPY TIME:  48 seconds.  MEDICATIONS AND MEDICAL HISTORY: Versed 0 mg, Fentanyl 0 mcg.  Additional Medications: None  ANESTHESIA/SEDATION: None  CONTRAST:  5 cc Omnipaque 300  PROCEDURE: The procedure, risks, benefits, and alternatives were explained to the patient. Questions regarding the procedure were encouraged and answered. The patient understands and consents to the  procedure.  The right upper quadrant was prepped with Betadine in a sterile fashion, and a sterile drape was applied covering the operative field. A sterile gown and sterile gloves were used for the procedure.  A 21 gauge needle was inserted into the gallbladder lumen under sonographic guidance and via transhepatic approach. It was removed over a 018 wire, which was upsized to a 3-J. A 10-French drain was advanced over the wire and coiled in the gallbladder lumen. It was sewn to the skin after being string fixed.  FINDINGS: The cystic duct is occluded.  COMPLICATIONS: None  IMPRESSION: Successful cholecystostomy. This needs to remain in place at least 6 weeks.   Electronically Signed   By: Jolaine ClickArthur  Hoss M.D.   On: 10/07/2014 08:44   Dg Chest Port 1 View  10/06/2014   CLINICAL DATA:  Central line placement.  EXAM: PORTABLE  CHEST - 1 VIEW  COMPARISON:  10/06/2014  FINDINGS: Interval left IJ central venous catheter placement. The tip projects over the region of the proximal SVC however I cannot exclude extension into the azygos vein. Prominent cardiomediastinal contours. Hypoaeration with interstitial and vascular crowding. Bibasilar opacities. Small effusions not excluded. No pneumothorax. Multilevel degenerative change. Right lateral approach percutaneous cholecystostomy tube.  IMPRESSION: Left IJ catheter tip projects over the proximal SVC however extension into the azygos vein is not excluded. Consider correlation with a lateral view.  Hypoaeration with interstitial and vascular crowding. Lung base opacities, favor atelectasis.   Electronically Signed   By: Jearld Lesch M.D.   On: 10/06/2014 22:33   Dg Chest Port 1 View  10/06/2014   CLINICAL DATA:  Shortness of breath, dyspnea, post percutaneous cholecystostomy earlier today, diabetes, hypertension, coronary artery disease post MI, atrial fibrillation  EXAM: PORTABLE CHEST - 1 VIEW  COMPARISON:  Portable exam 1648 hours compared to 10/01/2014  FINDINGS:  Enlargement of cardiac silhouette.  Mediastinal contours and pulmonary vascularity normal.  Mild RIGHT basilar atelectasis, new.  Remaining lungs clear.  No pleural effusion or pneumothorax.  RIGHT glenohumeral degenerative changes and probable chronic rotator cuff tear.  IMPRESSION: Mild RIGHT basilar atelectasis.   Electronically Signed   By: Ulyses Southward M.D.   On: 10/06/2014 17:10   Dg Chest Portable 1 View  10/01/2014   CLINICAL DATA:  Abdominal pain, left upper quadrant  EXAM: PORTABLE CHEST - 1 VIEW  COMPARISON:  None currently available  FINDINGS: Normal heart size and mediastinal contours for technique. No acute infiltrate or edema. No effusion or pneumothorax. No acute osseous findings.  IMPRESSION: No active disease.   Electronically Signed   By: Marnee Spring M.D.   On: 10/01/2014 03:16   Ct Angio Abd/pel W/ And/or W/o  10/01/2014   CLINICAL DATA:  Left upper quadrant pain for 3 hours with paleness and diaphoresis.  EXAM: CT ANGIOGRAPHY CHEST, ABDOMEN AND PELVIS  TECHNIQUE: Multidetector CT imaging through the chest, abdomen and pelvis was performed using the standard protocol during bolus administration of intravenous contrast. Multiplanar reconstructed images and MIPs were obtained and reviewed to evaluate the vascular anatomy.  CONTRAST:  OMNIPAQUE IOHEXOL 350 MG/ML SOLN  COMPARISON:  None.  FINDINGS: CTA CHEST FINDINGS  THORACIC INLET/BODY WALL:  No acute abnormality.  MEDIASTINUM:  Normal heart size. No pericardial effusion. There is coronary atherosclerosis which is diffuse throughout the left and right circulation. No aortic aneurysm, dissection, or intramural hematoma. Small sliding hiatal hernia. No evidence of pulmonary embolism.  LUNG WINDOWS:  No consolidation.  No effusion.  No suspicious pulmonary nodule.  OSSEOUS:  See below  Review of the MIP images confirms the above findings.  CTA ABDOMEN AND PELVIS FINDINGS  BODY WALL: No contributory findings.  Liver: Hepatic steatosis.   No focal abnormality.  Biliary: Cholelithiasis. No evidence of biliary obstruction or inflammation.  Pancreas: Unremarkable.  Spleen: Unremarkable.  Adrenals: Unremarkable.  Kidneys and ureters: 2 punctate calculi in the lower pole left kidney, nonobstructive. No ureteral calculus or hydronephrosis  Bladder: Partly obscured by streak artifact from of the right hip prosthesis. No pathologic findings  Reproductive: Negative for age.  Bowel: Distal colonic diverticulosis. No bowel inflammation or obstruction. Negative appendix.  Retroperitoneum: No mass or adenopathy.  Peritoneum: No ascites or pneumoperitoneum.  Vascular: Standard aortic branching. No aneurysm, dissection, or wall thickening. There is scattered atherosclerotic calcification of the aorta and branch vessels without notable stenosis.  OSSEOUS: Right  hip arthroplasty. There is near bridging bulky heterotopic ossification about the joint.  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 This is presumably posttraumatic given unilaterality.  Mid thoracic vertebral body compression deformities appear chronic. There is advanced and diffuse spondylosis. 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  Review of the MIP images confirms the above findings.  IMPRESSION: 1. No acute findings, including aortic dissection. 2. Extensive coronary atherosclerosis. 3. Hepatic steatosis. 4. Cholelithiasis. 5. Additional chronic findings are noted above.   Electronically Signed   By: Marnee Spring M.D.   On: 10/01/2014 03:54    ASSESSMENT AND PLAN  1. Acute cholecystitis 2. Preoperative cardiac clearance for cholycystectomy. He appears stable from a cardiac standpoint. He has not had any recent angina or SOB. EKG shows inferior infarct with no acute ST changes. I do not have an old EKG after his Cath 2 years ago to compare to. I will try to get a copy of his EKG and cath report from [facility]. He thinks he is able to walk 4  blocks without any anginal symptoms. He is moderate risk for surgery due to History of CAD but is not having any active exertional anginal symptoms.2-D echo shows good LV systolic function. 3. PAF maintaining NSR. He never got the Eliquis filled. Recommend restarting Eliquis after surgery. This patients CHA2DS2-VASc Score and unadjusted Ischemic Stroke Rate (% per year) is equal to 4 % stroke rate/year from a score of 4 Above score calculated as 1 point each if present [CHF, HTN, DM, Vascular=MI/PAD/Aortic Plaque, Age if 65-74, or Male] Above score calculated as 2 points each if present [Age > 75, or Stroke/TIA/TE] 4. HTN - stable 5. 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 Continue ASA. 6. DM 7. Dyslipidemia on high dose statin.  Signed, Cassell Clement MD

## 2014-10-08 NOTE — Progress Notes (Signed)
Subjective: In bed no real complaints, it hurts right side when he gets up and goes to BR, but otherwise no abdominal complaints. Drain working well, brown colored fluid in the bag.  Objective: Vital signs in last 24 hours: Temp:  [97.3 F (36.3 C)-98.3 F (36.8 C)] 98.3 F (36.8 C) (04/21 2345) Pulse Rate:  [67-85] 81 (04/21 2100) Resp:  [14-32] 19 (04/22 0700) BP: (77-158)/(36-101) 148/60 mmHg (04/22 0700) SpO2:  [91 %-100 %] 100 % (04/22 0700) Weight:  [119.8 kg (264 lb 1.8 oz)] 119.8 kg (264 lb 1.8 oz) (04/22 0500) Last BM Date: 10/04/14 1200 urine 345 from the drain Diet: clear liquids Afebrile, BP stable but still on pressor Creatinine & WBC slowly trending down WBC Intake/Output from previous day: 04/21 0701 - 04/22 0700 In: 2396.5 [P.O.:500; I.V.:1744.5; IV Piggyback:152] Out: 1545 [Urine:1200; Drains:345] Intake/Output this shift:    General appearance: alert, cooperative and no distress GI: sore over the drain site, but otherwise no tenderness.    Lab Results:   Recent Labs  10/07/14 0558 10/08/14 0530  WBC 16.9* 11.7*  HGB 10.3* 9.4*  HCT 30.4* 27.3*  PLT 228 254    BMET  Recent Labs  10/07/14 1900 10/08/14 0530  NA 134* 137  K 3.5 4.0  CL 101 102  CO2 24 24  GLUCOSE 213* 203*  BUN 52* 48*  CREATININE 2.42* 2.04*  CALCIUM 7.7* 8.0*   PT/INR  Recent Labs  10/06/14 0445  LABPROT 14.0  INR 1.07     Recent Labs Lab 10/05/14 1640 10/06/14 0445 10/07/14 0558 10/08/14 0530  AST 34 34 50* 29  ALT 25 26 35 27  ALKPHOS 76 74 87 72  BILITOT 2.8* 2.5* 2.4* 1.1  PROT 6.6 6.0 6.3 5.8*  ALBUMIN 3.0* 2.7* 2.6* 2.4*     Lipase     Component Value Date/Time   LIPASE 25 10/05/2014 1640     Studies/Results: Ir Perc Cholecystostomy  10/07/2014   CLINICAL DATA:  Acute cholecystitis with sepsis  EXAM: CHOLECYSTOSTOMY  FLUOROSCOPY TIME:  48 seconds.  MEDICATIONS AND MEDICAL HISTORY: Versed 0 mg, Fentanyl 0 mcg.  Additional Medications:  None  ANESTHESIA/SEDATION: None  CONTRAST:  5 cc Omnipaque 300  PROCEDURE: The procedure, risks, benefits, and alternatives were explained to the patient. Questions regarding the procedure were encouraged and answered. The patient understands and consents to the procedure.  The right upper quadrant was prepped with Betadine in a sterile fashion, and a sterile drape was applied covering the operative field. A sterile gown and sterile gloves were used for the procedure.  A 21 gauge needle was inserted into the gallbladder lumen under sonographic guidance and via transhepatic approach. It was removed over a 018 wire, which was upsized to a 3-J. A 10-French drain was advanced over the wire and coiled in the gallbladder lumen. It was sewn to the skin after being string fixed.  FINDINGS: The cystic duct is occluded.  COMPLICATIONS: None  IMPRESSION: Successful cholecystostomy. This needs to remain in place at least 6 weeks.   Electronically Signed   By: Jolaine ClickArthur  Hoss M.D.   On: 10/07/2014 08:44   Dg Chest Port 1 View  10/06/2014   CLINICAL DATA:  Central line placement.  EXAM: PORTABLE CHEST - 1 VIEW  COMPARISON:  10/06/2014  FINDINGS: Interval left IJ central venous catheter placement. The tip projects over the region of the proximal SVC however I cannot exclude extension into the azygos vein. Prominent cardiomediastinal contours. Hypoaeration with interstitial  and vascular crowding. Bibasilar opacities. Small effusions not excluded. No pneumothorax. Multilevel degenerative change. Right lateral approach percutaneous cholecystostomy tube.  IMPRESSION: Left IJ catheter tip projects over the proximal SVC however extension into the azygos vein is not excluded. Consider correlation with a lateral view.  Hypoaeration with interstitial and vascular crowding. Lung base opacities, favor atelectasis.   Electronically Signed   By: Jearld Lesch M.D.   On: 10/06/2014 22:33   Dg Chest Port 1 View  10/06/2014   CLINICAL DATA:   Shortness of breath, dyspnea, post percutaneous cholecystostomy earlier today, diabetes, hypertension, coronary artery disease post MI, atrial fibrillation  EXAM: PORTABLE CHEST - 1 VIEW  COMPARISON:  Portable exam 1648 hours compared to 10/01/2014  FINDINGS: Enlargement of cardiac silhouette.  Mediastinal contours and pulmonary vascularity normal.  Mild RIGHT basilar atelectasis, new.  Remaining lungs clear.  No pleural effusion or pneumothorax.  RIGHT glenohumeral degenerative changes and probable chronic rotator cuff tear.  IMPRESSION: Mild RIGHT basilar atelectasis.   Electronically Signed   By: Ulyses Southward M.D.   On: 10/06/2014 17:10    Medications: . allopurinol  300 mg Oral Daily  . antiseptic oral rinse  7 mL Mouth Rinse q12n4p  . chlorhexidine  15 mL Mouth Rinse BID  . folic acid  1 mg Oral Daily  . insulin glargine  20 Units Subcutaneous QHS  . insulin regular  0-10 Units Intravenous TID WC  . multivitamin with minerals  1 tablet Oral Daily  . pantoprazole  40 mg Oral Daily  . piperacillin-tazobactam (ZOSYN)  IV  3.375 g Intravenous 3 times per day  . thiamine  100 mg Oral Daily   . sodium chloride 10 mL/hr at 10/06/14 2322  . sodium chloride 50 mL/hr at 10/07/14 1822  . dextrose 5 % and 0.45% NaCl 50 mL/hr at 10/07/14 2000  . heparin 1,800 Units/hr (10/08/14 0425)  . insulin (NOVOLIN-R) infusion 2.5 Units/hr (10/08/14 0550)  . norepinephrine (LEVOPHED) Adult infusion 6 mcg/min (10/08/14 1610)   Assessment/Plan Acute cholecystitis with cholelithiasis S/p IR percutaneous cholecystostomy placement 10/06/14, Dr. Bonnielee Haff. Hypotension still on Levophed CAD s/p MI about 2 years ago A fib  -  Currently in SR Acute kidney injury AODM Anemia DVT: Heparin drip/TED hose Zosyn: Started 4/19 day 4 of treatment today.   Plan: From our standpoint you can increase his diet as he tolerates it.  Nothing further to add currently from surgery.  Will continue to follow with you.. Follow up  after discharge with Dr. [name] to consider cholecystectomy after drain has been in place 6 weeks.   LOS: 3 days    Larry [name] 10/08/2014

## 2014-10-08 NOTE — Progress Notes (Signed)
PROGRESS NOTE  Larry [name] WJX:[id number] DOB: 03-01-1946 DOA: 10/05/2014 PCP: Pcp Not In System  HPI: Larry [name] is a 69 y.o. male presents with abdominal pain, found to have acute cholecystitis on imaging.   Subjective / 24 H Interval events - underwent percutaneous drain placement yesterday, post procedure became hypotensive and hypoxic requiring ICU transfer and initiation of pressors - clinically feels better this morning - breathing improved, able to be weaned off from NRB to [place] - still on vasopressors - denies chest pain, denies nausea/vomiting. Had one episode of emesis last night.   Assessment/Plan: Principal Problem:   Cholecystitis Active Problems:   Diabetes mellitus without complication   HTN (hypertension)   Hypotension   Anemia   CAD (coronary artery disease)   Acute cholecystitis   Dyspnea   Septic shock   Paroxysmal atrial fibrillation   Septic shock with sepsis that was evolving on admission - requiring ICU transfer 4/20 and initiation of Levophed right after percutaneous cholecystostomy  - still requiring vasopressors this morning, clinically he looks improved hopefully will be able to wean off pressors today - PCCM following - check cortisol, patient without known prior exposure to steroids  Acute cholecystitis with cholelithiasis - [place] positive for cholecystitis, general surgery consulted and following - s/p perc drain per IR 4/20; culture sent and stain with few GNRs, still pending this morning - continue Zosyn  CAD s/p MI about 2 years ago - was on Plavix for a year, has never followed up with cardiology since - appreciate cardiology evaluation, 2D echo done 4/21 with EF 55-60%, no WMA - troponin borderline elevated, no chest pain, EKG non ischemic, likely due to demand in the setting of septic shock   A fib - this was seen initially on 4/15 when he presented to the ED, started on Eliquis however patient has not started it yet -  currently he is in sinus rhythm, will obtain 2D echo as bove - I discussed with IR, OK from their standpoint to start anticoagulation today, started heparin 4/21, CBC stable, renal function improving, Eliquis likely tomorrow.   DM - SSI, A1C at 8.2 showing poor control.  - needing glucose stabilizer last night due to elevated CBGs, improved this morning, resume home Lantus 40 U today, off of the drip. Advance diet as per surgery to goal carb modified.   Anemia - unknown baseline, stable  AKI  - unknown baseline, likely worsening renal function in the setting of dehydration due to poor po intake and septic shock - had contrasted CT for PE on 4/15 so possible contrast induced as well - long standing DM can contribute - Cr improving now   Diet: Diet clear liquid Room service appropriate?: Yes; Fluid consistency:: Thin Fluids: NS DVT Prophylaxis: heparin gtt  Code Status: Full Code Family Communication: no family at bedside this morning Disposition Plan: remain in ICU  Consultants:  General surgery   Cardiology   IR  PCCM  Procedures:  Percutaneous cholecystostomy 4/20    Antibiotics Zosyn 4/19 >>   Studies - personally reviewed studies 4/20  1. Ct Abdomen Pelvis Wo Contrast 10/05/2014 1. Cholelithiasis with gallbladder distention, gallbladder wall thickening and pericholecystic inflammatory changes most consistent with acute cholecystitis.   2. US Abdomen Complete 10/05/2014 Consistent with acute cholecystitis. Gallstones. Wall thickening. Small amount appear cholecystic fluid. Positive Murphy sign.   3. CXR 4/20 Mild RIGHT basilar atelectasis. 4. CXR 4/20 Left IJ catheter tip projects over the proximal SVC however extension  into the azygos vein is not excluded. Consider correlation with a lateral view. Hypoaeration with interstitial and vascular crowding. Lung base opacities, favor atelectasis. 5. IR Perc cholecystostomy 4/20 Successful cholecystostomy. This needs to  remain in place at least 6 weeks.  Objective  Filed Vitals:   10/08/14 0700 10/08/14 0800 10/08/14 0900 10/08/14 1000  BP: 148/60 138/50 129/51 128/66  Pulse:      Temp:  97.7 F (36.5 C)    TempSrc:  Oral    Resp: 19 16 16 16   Height:      Weight:      SpO2: 100% 100% 100% 100%    Intake/Output Summary (Last 24 hours) at 10/08/14 1057 Last data filed at 10/08/14 0923  Gross per 24 hour  Intake 2593.96 ml  Output   1545 ml  Net 1048.96 ml   Filed Weights   10/05/14 1613 10/06/14 1900 10/08/14 0500  Weight: 117.935 kg (260 lb) 117.2 kg (258 lb 6.1 oz) 119.8 kg (264 lb 1.8 oz)    Exam:  General:  NAD, pleasant, Charles in place  HEENT: no scleral icterus noticed, PERRL  Cardiovascular: RRR without MRG, 2+ peripheral pulses, trace LE edema  Respiratory: good air movement, no wheezing, no crackles, no rales  Abdomen: soft, no tenderness but "pressure" to palpation RUQ, BS +, drain in place   MSK/Extremities: no clubbing/cyanosis, no joint swelling  Skin: no rashes  Neuro: non focal, A x O x 4  Data Reviewed: Basic Metabolic Panel:  Recent Labs Lab 10/05/14 1640 10/06/14 0445 10/07/14 0558 10/07/14 1900 10/08/14 0530  NA 133* 137 134* 134* 137  K 3.8 3.5 4.4 3.5 4.0  CL 98 103 102 101 102  CO2 25 24 22 24 24   GLUCOSE 198* 120* 222* 213* 203*  BUN 57* 52* 57* 52* 48*  CREATININE 2.33* 2.30* 2.88* 2.42* 2.04*  CALCIUM 7.9* 8.0* 7.7* 7.7* 8.0*  MG  --   --   --   --  2.0  PHOS  --   --   --   --  3.2   Liver Function Tests:  Recent Labs Lab 10/05/14 1640 10/06/14 0445 10/07/14 0558 10/08/14 0530  AST 34 34 50* 29  ALT 25 26 35 27  ALKPHOS 76 74 87 72  BILITOT 2.8* 2.5* 2.4* 1.1  PROT 6.6 6.0 6.3 5.8*  ALBUMIN 3.0* 2.7* 2.6* 2.4*    Recent Labs Lab 10/05/14 1640  LIPASE 25   CBC:  Recent Labs Lab 10/05/14 1640 10/06/14 0445 10/07/14 0558 10/08/14 0530  WBC 11.7* 10.5 16.9* 11.7*  NEUTROABS 9.7*  --   --   --   HGB 10.6* 9.5* 10.3*  9.4*  HCT 30.3* 27.8*  27.7* 30.4* 27.3*  MCV 89.1 90.8 92.7 92.2  PLT 174 167 228 254   Cardiac Enzymes:  Recent Labs Lab 10/06/14 1957 10/07/14 0029 10/07/14 1045  TROPONINI 0.05* 0.05* 0.04*   CBG:  Recent Labs Lab 10/08/14 0549 10/08/14 0700 10/08/14 0808 10/08/14 0907 10/08/14 1006  GLUCAP 186* 193* 201* 182* 166*    Recent Results (from the past 240 hour(s))  Surgical pcr screen     Status: None   Collection Time: 10/06/14  8:06 AM  Result Value Ref Range Status   MRSA, PCR NEGATIVE NEGATIVE Final   Staphylococcus aureus NEGATIVE NEGATIVE Final    Comment:        The Xpert SA Assay (FDA approved for NASAL specimens in patients over 68 years of age), is  one component of a comprehensive surveillance program.  Test performance has been validated by [facility] for patients greater than or equal to 38 year old. It is not intended to diagnose infection nor to guide or monitor treatment.   Body fluid culture     Status: None (Preliminary result)   Collection Time: 10/06/14  4:15 PM  Result Value Ref Range Status   Specimen Description BILE  Final   Special Requests NONE  Final   Gram Stain   Final    NO WBC SEEN FEW GRAM NEGATIVE RODS Gram Stain Report Called to,Read Back By and Verified With: Gram Stain Report Called to,Read Back By and Verified With: Caesar Chestnut RN 10/07/14 8:40AM BY MILSH Performed at [facility]   Final    Culture reincubated for better growth Performed at [facility]    Report Status PENDING  Incomplete     Scheduled Meds: . allopurinol  300 mg Oral Daily  . antiseptic oral rinse  7 mL Mouth Rinse q12n4p  . chlorhexidine  15 mL Mouth Rinse BID  . folic acid  1 mg Oral Daily  . insulin aspart  0-5 Units Subcutaneous QHS  . insulin aspart  0-9 Units Subcutaneous TID WC  . insulin glargine  40 Units Subcutaneous Daily  . multivitamin with minerals  1 tablet Oral Daily  . pantoprazole  40 mg Oral  Daily  . piperacillin-tazobactam (ZOSYN)  IV  3.375 g Intravenous 3 times per day  . thiamine  100 mg Oral Daily   Continuous Infusions: . sodium chloride 10 mL/hr at 10/06/14 2322  . sodium chloride 50 mL/hr at 10/07/14 1822  . dextrose 5 % and 0.45% NaCl 50 mL/hr at 10/07/14 2000  . heparin 1,800 Units/hr (10/08/14 0425)  . norepinephrine (LEVOPHED) Adult infusion 3 mcg/min (10/08/14 1014)    Pamella Pert, MD [facility] Pager [phone]. If 7 PM - 7 AM, please contact night-coverage at [email or web address], password [facility] 10/08/2014, 10:57 AM  LOS: 3 days

## 2014-10-08 NOTE — Clinical Documentation Improvement (Signed)
Presents with acute cholecystitis; percutaneous cholecystotomy with drain performed.   Post op, patient became hypotensive (86/54 with MAPS in 50's)  Organ Failure, WBC 11.7 on admission; progressed to 16.9, elevated lactate  Pressors Initiated; Antibiotics Initiated  Septic Shock documented  Please clarify if patient's Sepsis was: Present or evolving on admission            Acquired after admission            Other Condition            Unable to Clinically Determine  Thank You, Shellee MiloEileen T Sheniqua Carolan ,RN Clinical Documentation Specialist:  [phone]  [facility]- Health Information Management

## 2014-10-08 NOTE — Progress Notes (Signed)
ANTICOAGULATION CONSULT NOTE - F/u Consult  Pharmacy Consult for Heparin Indication: atrial fibrillation  No Known Allergies  Patient Measurements: Height:  (172.7 cm) Weight: 258 lb 6.1 oz (117.2 kg) IBW/kg (Calculated) : 68.4 Heparin Dosing Weight: 95 kg  Vital Signs: Temp: 98.3 F (36.8 C) (04/21 2345) Temp Source: Oral (04/21 2345) BP: 134/55 mmHg (04/22 0035) Pulse Rate: 81 (04/21 2100)  Labs:  Recent Labs  10/05/14 1640 10/06/14 0445 10/06/14 1957 10/07/14 0029 10/07/14 0558 10/07/14 1045 10/07/14 1900 10/07/14 2155  HGB 10.6* 9.5*  --   --  10.3*  --   --   --   HCT 30.3* 27.8*  27.7*  --   --  30.4*  --   --   --   PLT 174 167  --   --  228  --   --   --   APTT  --  34  --   --   --   --   --   --   LABPROT  --  14.0  --   --   --   --   --   --   INR  --  1.07  --   --   --   --   --   --   HEPARINUNFRC  --   --   --   --   --   --   --  <0.10*  CREATININE 2.33* 2.30*  --   --  2.88*  --  2.42*  --   TROPONINI  --   --  0.05* 0.05*  --  0.04*  --   --     Estimated Creatinine Clearance: 36.3 mL/min (by C-G formula based on Cr of 2.42).   Medical History: Past Medical History  Diagnosis Date  . Diabetes mellitus without complication   . MI (myocardial infarction)   . Hypertension   . Atrial fibrillation 10-01-14  . Coronary artery disease   . Obesity     Medications:  Prescriptions prior to admission  Medication Sig Dispense Refill Last Dose  . acetaminophen-codeine (TYLENOL #3) 300-30 MG per tablet Take 1 tablet by mouth 2 (two) times daily.   Past Week at Unknown time  . allopurinol (ZYLOPRIM) 300 MG tablet Take 300 mg by mouth daily.   Past Week at Unknown time  . amLODipine (NORVASC) 10 MG tablet Take 10 mg by mouth daily.   Past Week at Unknown time  . apixaban (ELIQUIS) 5 MG TABS tablet Take 1 tablet (5 mg total) by mouth 2 (two) times daily. 60 tablet 0 Past Week at Unknown time  . aspirin 81 MG chewable tablet Chew 81 mg by mouth  daily.   10/05/2014 at Unknown time  . atorvastatin (LIPITOR) 80 MG tablet Take 80 mg by mouth daily.   Past Week at Unknown time  . Cholecalciferol (VITAMIN D PO) Take 1 tablet by mouth daily.    Past Week at Unknown time  . citalopram (CELEXA) 40 MG tablet Take 40 mg by mouth daily.   10/05/2014 at Unknown time  . Cyanocobalamin (VITAMIN B 12 PO) Take 1 tablet by mouth daily.    Past Week at Unknown time  . furosemide (LASIX) 40 MG tablet Take 40 mg by mouth daily.    Past Week at Unknown time  . insulin glargine (LANTUS) 100 UNIT/ML injection Inject 40 Units into the skin at bedtime.   Past Week at Unknown time  . Liraglutide 18  MG/3ML SOPN Inject 1.2 mg into the skin daily.   Past Week at Unknown time  . lisinopril (PRINIVIL,ZESTRIL) 40 MG tablet Take 40 mg by mouth daily.   Past Week at Unknown time  . magnesium oxide (MAG-OX) 400 MG tablet Take 400 mg by mouth daily.   Past Week at Unknown time  . metFORMIN (GLUCOPHAGE) 500 MG tablet Take 500 mg by mouth 2 (two) times daily with a meal.   Past Week at Unknown time  . pantoprazole (PROTONIX) 40 MG tablet Take 40 mg by mouth daily.   Past Week at Unknown time  . sucralfate (CARAFATE) 1 GM/10ML suspension Take 10 mLs (1 g total) by mouth 4 (four) times daily -  with meals and at bedtime. 420 mL 0 Past Month at Unknown time   Scheduled:  . allopurinol  300 mg Oral Daily  . antiseptic oral rinse  7 mL Mouth Rinse q12n4p  . chlorhexidine  15 mL Mouth Rinse BID  . folic acid  1 mg Oral Daily  . heparin  2,000 Units Intravenous Once  . insulin glargine  20 Units Subcutaneous QHS  . insulin regular  0-10 Units Intravenous TID WC  . multivitamin with minerals  1 tablet Oral Daily  . pantoprazole  40 mg Oral Daily  . piperacillin-tazobactam (ZOSYN)  IV  3.375 g Intravenous 3 times per day  . thiamine  100 mg Oral Daily   Infusions:  . sodium chloride 10 mL/hr at 10/06/14 2322  . sodium chloride 50 mL/hr at 10/07/14 1822  . dextrose 5 % and  0.45% NaCl 50 mL/hr at 10/07/14 2000  . heparin 1,800 Units/hr (10/08/14 0134)  . insulin (NOVOLIN-R) infusion 2 Units/hr (10/08/14 0055)  . norepinephrine (LEVOPHED) Adult infusion 7 mcg/min (10/08/14 0000)   PRN: acetaminophen **OR** acetaminophen, dextrose, HYDROmorphone (DILAUDID) injection, ondansetron **OR** ondansetron (ZOFRAN) IV, oxyCODONE   Assessment: 69 year old male admitted for acute cholecystitis 4/19. Perc chole drain placed 4/20. Hypotensive post-procedurally to ICU for pressors.  New Afib seen on 4/15, was given prescription for Eliquis but never picked up.  Cleared by surgery/IR to resume anticoagulation, cardiology in agreement.  Pharmacy consulted to begin heparin IV with poor renal function, to transition to Eliquis when improved.   Baseline INR, aPTT: wnl  Prior anticoagulation: none  CHADS2-VASc = 4  Significant events: 4/20 - cholecystostomy placed  10/07/14  CBC: Hgb low but stable post-op; Plt wnl  No bleeding or infusion issues per nursing, cleared by IR/surgery  CrCl: 31 ml/min Today, 10/08/14  2155 HL=<0.10, no problems per RN   Goal of Therapy: Heparin level 0.3-0.7 units/ml Monitor platelets by anticoagulation protocol: Yes  Plan:  Heparin 2000 units IV rebolus x 1; will use lower bolus d/t poor renal function and recent surgery.  Increase Heparin drip 1800 units/hr  Check heparin level 8 hrs after start, given both age almost [age] and CrCl almost < 30  Daily CBC, daily heparin level once stable  Monitor for signs of bleeding or thrombosis  Follow for conversion to Eliquis    Lorenza EvangelistGreen, Quintez Maselli R 10/08/2014, 1:34 AM

## 2014-10-08 NOTE — Progress Notes (Signed)
PULMONARY / CRITICAL CARE MEDICINE   Name: Larry [name] MRN: [id number] DOB: 1945/08/05    ADMISSION DATE:  10/05/2014 CONSULTATION DATE:  10/06/2014  REFERRING MD :  Dr. Elvera Lennox  CHIEF COMPLAINT:  Abd pain  INITIAL PRESENTATION:  69 year old male admitted for acute cholecystitis 4/19. Perc chole drain placed 4/20. Hypotensive post-procedurally to ICU for pressors. PCCM consult.   STUDIES:  RUQ [place] 4/20 > Consistent with acute cholecystitis. Gallstones. Wall thickening. Small amount appear cholecystic fluid. Positive Murphy sign.  SIGNIFICANT EVENTS: 4/19 admitted for acute cholecystitis 4/20 Perc chole drain placed, hypotensive, to ICU for pressors   SUBJECTIVE:  No distress. Pain is well managed.   VITAL SIGNS: Temp:  [97.3 F (36.3 C)-98.3 F (36.8 C)] 98.3 F (36.8 C) (04/21 2345) Pulse Rate:  [67-84] 81 (04/21 2100) Resp:  [14-32] 19 (04/22 0700) BP: (77-158)/(36-60) 148/60 mmHg (04/22 0700) SpO2:  [91 %-100 %] 100 % (04/22 0700) Weight:  [119.8 kg (264 lb 1.8 oz)] 119.8 kg (264 lb 1.8 oz) (04/22 0500) HEMODYNAMICS: CVP:  [5 mmHg-13 mmHg] 12 mmHg VENTILATOR SETTINGS:   INTAKE / OUTPUT:  Intake/Output Summary (Last 24 hours) at 10/08/14 0835 Last data filed at 10/08/14 4098  Gross per 24 hour  Intake 2396.5 ml  Output   1545 ml  Net  851.5 ml    PHYSICAL EXAMINATION: General:  Obese male in NAD, resting in bed  Neuro:  Awake and alert, interacting, fully oriented  HEENT:  [place] 5/AT, no JVD, PERRL Cardiovascular:  RRR, no MRG Lungs:  Distant breath sounds but clear Abdomen:  Soft, R side tender, non-distended. RUQ drain in place, clear bilious fluid Musculoskeletal:  No acute deformity Skin:  Grossly intact  LABS:  CBC  Recent Labs Lab 10/06/14 0445 10/07/14 0558 10/08/14 0530  WBC 10.5 16.9* 11.7*  HGB 9.5* 10.3* 9.4*  HCT 27.8*  27.7* 30.4* 27.3*  PLT 167 228 254   Coag's  Recent Labs Lab 10/06/14 0445  APTT 34  INR 1.07    BMET  Recent Labs Lab 10/07/14 0558 10/07/14 1900 10/08/14 0530  NA 134* 134* 137  K 4.4 3.5 4.0  CL 102 101 102  CO2 BUN 57* 52* 48*  CREATININE 2.88* 2.42* 2.04*  GLUCOSE 222* 213* 203*   Electrolytes  Recent Labs Lab 10/07/14 0558 10/07/14 1900 10/08/14 0530  CALCIUM 7.7* 7.7* 8.0*  MG  --   --  2.0  PHOS  --   --  3.2   Sepsis Markers  Recent Labs Lab 10/05/14 1739 10/06/14 2330 10/07/14 1045  LATICACIDVEN 1.63 1.0 0.9   ABG No results for input(s): PHART, PCO2ART, PO2ART in the last 168 hours. Liver Enzymes  Recent Labs Lab 10/06/14 0445 10/07/14 0558 10/08/14 0530  AST 34 50* 29  ALT 26 35 27  ALKPHOS 74 87 72  BILITOT 2.5* 2.4* 1.1  ALBUMIN 2.7* 2.6* 2.4*   Cardiac Enzymes  Recent Labs Lab 10/06/14 1957 10/07/14 0029 10/07/14 1045  TROPONINI 0.05* 0.05* 0.04*   Glucose  Recent Labs Lab 10/08/14 0051 10/08/14 0204 10/08/14 0308 10/08/14 0444 10/08/14 0549 10/08/14 0700  GLUCAP 126* 114* 126* 173* 186* 193*    Imaging No results found.   ASSESSMENT / PLAN:  PULMONARY A: Acute hypoxemic respiratory failure  P:   Supplemental O2 as needed to maintain SpO2 > 92%  CARDIOVASCULAR CVL 4/20 > A:  Septic shock, lactate peak 2.12 H/o PAF on eliquis (now in NSR), anticoag held  H/o CAD Still on pressors but appears as though these can be weaned to off.  P:  Keep euvolemic at this point Wean levophed  Resume eliquis and ASA  when ok w/ surgical and IR teams He was on home antihypertensives and diuretics. This will need to be re-evaluated daily.   RENAL A:   AKI suspect prerenal / ATN etiology-->improving   P:   Follow renal fxn Keep euvolemic Hold Ace-I   GASTROINTESTINAL A:   Acute cholecystitis s/p perc chole drain  P:   Adv diet  SUP: IV protonix See ID section Monitor drain output Will need cholecystectomy in 6-8 weeks per surgery.  HEMATOLOGIC A:   Anemia hgb drift w/ volume  resuscitation efforts.  P:  Follow CBC Transfuse per ICU guidelines  eliquis and ASA when OK with IR  INFECTIOUS A:   Acute cholecystitis  P:   Body fluid culture 4/20 >> GNR >>  Abx: pip/tazo, start date 4/20>>> Follow WBC and fever curve Will need 2 weeks of abx total  ENDOCRINE A:   DM2    P:   CBG monitoring and SSI  NEUROLOGIC A:   Acute metabolic encephalopathy, improved Pain management  P:   RASS goal: 0 Dilaudid PRN   FAMILY  - Updates:   - Inter-disciplinary family meet or Palliative Care meeting due by:  4/27  NP SUMMARY All diagnostics and clinical exam suggest he is improving. Suspect he will come off levophed today. Surgery is going to start diet. Will need to mobilize him. Should be able to leave the ICU by tomorrow.   Simonne MartinetPeter E Babcock ACNP-BC [facility] Pager # [phone] OR # [phone] if no answer   Attending Note:  I have examined patient, reviewed labs, studies and notes. I have discussed the case with Kreg ShropshireP Babcock, and I agree with the data and plans as amended above.   Levy Pupaobert Byrum, MD, PhD 10/08/2014, 11:39 AM [place] [facility] [phone] or if no answer [phone]

## 2014-10-09 LAB — BASIC METABOLIC PANEL
Anion gap: 6 (ref 5–15)
BUN: 38 mg/dL — ABNORMAL HIGH (ref 6–23)
CHLORIDE: 105 mmol/L (ref 96–112)
CO2: 27 mmol/L (ref 19–32)
CREATININE: 1.71 mg/dL — AB (ref 0.50–1.35)
Calcium: 8 mg/dL — ABNORMAL LOW (ref 8.4–10.5)
GFR, EST AFRICAN AMERICAN: 46 mL/min — AB (ref >90)
GFR, EST NON AFRICAN AMERICAN: 39 mL/min — AB (ref >90)
Glucose, Bld: 153 mg/dL — ABNORMAL HIGH (ref 70–99)
Potassium: 4 mmol/L (ref 3.5–5.1)
Sodium: 138 mmol/L (ref 135–145)

## 2014-10-09 LAB — GLUCOSE, CAPILLARY
Glucose-Capillary: 155 mg/dL — ABNORMAL HIGH (ref 70–99)
Glucose-Capillary: 140 mg/dL — ABNORMAL HIGH (ref 70–99)
Glucose-Capillary: 146 mg/dL — ABNORMAL HIGH (ref 70–99)
Glucose-Capillary: 136 mg/dL — ABNORMAL HIGH (ref 70–99)
Glucose-Capillary: 158 mg/dL — ABNORMAL HIGH (ref 70–99)

## 2014-10-09 LAB — HEPARIN LEVEL (UNFRACTIONATED): HEPARIN UNFRACTIONATED: 0.65 [IU]/mL (ref 0.30–0.70)

## 2014-10-09 LAB — CBC
HCT: 27.3 % — ABNORMAL LOW (ref 39.0–52.0)
Hemoglobin: 9 g/dL — ABNORMAL LOW (ref 13.0–17.0)
MCH: 30.7 pg (ref 26.0–34.0)
MCHC: 33 g/dL (ref 30.0–36.0)
MCV: 93.2 fL (ref 78.0–100.0)
PLATELETS: 274 10*3/uL (ref 150–400)
RBC: 2.93 MIL/uL — ABNORMAL LOW (ref 4.22–5.81)
RDW: 16.4 % — ABNORMAL HIGH (ref 11.5–15.5)
WBC: 8.2 10*3/uL (ref 4.0–10.5)

## 2014-10-09 LAB — OCCULT BLOOD X 1 CARD TO LAB, STOOL: Fecal Occult Bld: NEGATIVE

## 2014-10-09 LAB — CORTISOL: CORTISOL PLASMA: 26.4 ug/dL

## 2014-10-09 MED ORDER — APIXABAN 2.5 MG PO TABS
5.0000 mg | ORAL_TABLET | Freq: Two times a day (BID) | ORAL | Status: DC
  Administered 2014-10-09 – 2014-10-11 (×5): 5 mg via ORAL
  Filled 2014-10-09: qty 2
  Filled 2014-10-09: qty 1
  Filled 2014-10-09 (×3): qty 2

## 2014-10-09 NOTE — Progress Notes (Signed)
ANTICOAGULATION CONSULT NOTE - Follow Up Consult  Pharmacy Consult for Heparin Indication: atrial fibrillation (Eliquis PTA)  No Known Allergies  Patient Measurements: Height: 5\' 8"  (172.7 cm) Weight: 264 lb 1.8 oz (119.8 kg) IBW/kg (Calculated) : 68.4 Heparin Dosing Weight: 96 kg  Vital Signs: Temp: 98.5 F (36.9 C) (04/23 0400) Temp Source: Oral (04/23 0400) BP: 106/54 mmHg (04/23 0400)  Labs:  Recent Labs  10/06/14 1957 10/07/14 0029  10/07/14 0558 10/07/14 1045 10/07/14 1900  10/08/14 0530 10/08/14 1110 10/08/14 2027 10/09/14 0500  HGB  --   --   < > 10.3*  --   --   --  9.4*  --   --  9.0*  HCT  --   --   --  30.4*  --   --   --  27.3*  --   --  27.3*  PLT  --   --   --  228  --   --   --  254  --   --  274  HEPARINUNFRC  --   --   --   --   --   --   < >  --  0.19* 0.24* 0.65  CREATININE  --   --   < > 2.88*  --  2.42*  --  2.04*  --   --  1.71*  TROPONINI 0.05* 0.05*  --   --  0.04*  --   --   --   --   --   --   < > = values in this interval not displayed. Estimated Creatinine Clearance: 52 mL/min (by C-G formula based on Cr of 1.71).  Medications:  Infusions:  . sodium chloride 10 mL/hr at 10/06/14 2322  . heparin 2,250 Units/hr (10/09/14 0517)  . norepinephrine (LEVOPHED) Adult infusion Stopped (10/08/14 1211)   Assessment: 69 year old male admitted for acute cholecystitis 4/19, s/p perc chole drain placement 4/20.  PMH includes new Afib seen on 4/15, prescribed Eliquis but never picked up. He has been cleared by surgery/IR to resume anticoagulation post-op.  Pharmacy consulted to begin heparin IV with poor renal function, to transition to Eliquis when improved.  Today, 10/09/2014  Heparin level finally therapeutic but rose to upper end of goal on current rate of 2250 units/hr  CBC: Hgb decreased slightly to 9, Plt stable/WNL  No bleeding or complications reported.  SCr improved with CrCl ~ 44 ml/min.  Goal of Therapy:  Heparin level 0.3-0.7  units/ml Monitor platelets by anticoagulation protocol: Yes   Plan: 1) Continue current heparin rate of 2250 units/hr 2) Recheck heparin confirmatory level at [address]   Elon Lomeli M Matthew Pais, PharmD, BCPS Pager [phone] 10/09/2014 6:09 AM

## 2014-10-09 NOTE — Progress Notes (Signed)
ANTIBIOTIC CONSULT NOTE - FOLLOW UP  Pharmacy Consult for Zosyn Indication: Acute cholecystitis w/ gallstones  No Known Allergies  Patient Measurements: Height: 5\' 8"  (172.7 cm) Weight: 264 lb 1.8 oz (119.8 kg) IBW/kg (Calculated) : 68.4  Vital Signs: Temp: 97.5 F (36.4 C) (04/23 1155) Temp Source: Oral (04/23 1155) BP: 115/58 mmHg (04/23 1155) Pulse Rate: 75 (04/23 1155) Intake/Output from previous day: 04/22 0701 - 04/23 0700 In: 1571.9 [P.O.:180; I.V.:1241.9; IV Piggyback:150] Out: 840 [Urine:550; Drains:290] Intake/Output from this shift: Total I/O In: 52.5 [I.V.:52.5] Out: 50 [Drains:50]  Labs:  Recent Labs  10/07/14 0558 10/07/14 1900 10/08/14 0530 10/09/14 0500  WBC 16.9*  --  11.7* 8.2  HGB 10.3*  --  9.4* 9.0*  PLT 228  --  254 274  CREATININE 2.88* 2.42* 2.04* 1.71*   Estimated Creatinine Clearance: 52 mL/min (by C-G formula based on Cr of 1.71). No results for input(s): VANCOTROUGH, VANCOPEAK, VANCORANDOM, GENTTROUGH, GENTPEAK, GENTRANDOM, TOBRATROUGH, TOBRAPEAK, TOBRARND, AMIKACINPEAK, AMIKACINTROU, AMIKACIN in the last 72 hours.    Assessment: 69 year old male admitted for acute cholecystitis 4/19. Perc chole drain placed 4/20.  Hypotensive post-procedurally and transferred to ICU.  Pharmacy is consulted to dose Zosyn.    4/20 >> Zosyn >>  Today, 10/09/2014: Day #4 Zosyn  Temp: afebrile  WBC: improved to WNL, 8.2  Renal: new AKI improving, SCr 1.7 (baseline ~1.2), with CrCl 52 ml/min  Biliary fluid culture with GNR  Goal of Therapy:  Appropriate abx dosing, eradication of infection.   Plan:   Continue Zosyn 3.375g IV Q8H infused over 4hrs.  Follow up renal fxn, culture results, and clinical course.   Lynann Beaverhristine Tennis Mckinnon PharmD, BCPS Pager [phone] 10/09/2014 12:13 PM

## 2014-10-09 NOTE — Progress Notes (Signed)
PROGRESS NOTE  Larry RombergKenneth R [name] ZOX:[id number] DOB: 1945/06/21 DOA: 10/05/2014 PCP: Pcp Not In System  HPI: Larry [name] is a 69 y.o. male presents with abdominal pain, found to have acute cholecystitis on imaging.   Interim history / events - admit 4.19, general surgery was consulted, felt to have acute cholecystitis. He was septic appearing, hypotensive, in renal failure, and with his history of CAD, MI s/p stent it was felt that it was safe to do drain vs cholecystectomy right away. Cardiology was consulted as well on admit. - drain placement 4/20, afterwards he developed septic shock requiring ICU transfer and Levophed.  - 4/21 clinically improving, still requiring pressors - 4/22 off Levophed - 4/23 transfer to floor   Subjective / 24 H Interval events - no longer on pressors this morning - no chest pain, shortness of breath, no abdominal pain - mild nausea last night  Assessment/Plan: Principal Problem:   Cholecystitis Active Problems:   Diabetes mellitus without complication   HTN (hypertension)   Hypotension   Anemia   CAD (coronary artery disease)   Acute cholecystitis   Dyspnea   Septic shock   Paroxysmal atrial fibrillation   Septic shock with sepsis that was evolving on admission - requiring ICU transfer 4/20 and initiation of Levophed right after percutaneous cholecystostomy, and has been on pressors until 4/22 - off vasopressors now - cortisol checked yesterday, appropriate level  - improving, move out to floor today   Acute cholecystitis with cholelithiasis - US positive for cholecystitis, general surgery consulted and following - s/p perc drain per IR 4/20; culture sent and stain with few GNRs, still pending - continue Zosyn, narrow to oral Abx on discharge and based on sensitivities, plan for total of 14 days from 4/20 when drain was placed - will need cholecystectomy in ~ 6 weeks  CAD s/p MI about 2 years ago - was on Plavix for a year, has never  followed up with cardiology since - appreciate cardiology evaluation, 2D echo done 4/21 with EF 55-60%, no WMA - troponin borderline elevated, no chest pain, EKG non ischemic, likely due to demand in the setting of septic shock   A fib - paroxysmal, this was seen initially on 4/15 when he presented to the ED, started on Eliquis however patient has not started it prior to this admission - currently he is in sinus rhythm and has been maintaining sinus rhythm  - he was on no anticoagulation pending biliary drain placement, after discussing with IR he was started on heparin gtt on 4/21, and is now being transitioned to Eliquis on 4/23 as his kidney function is improving  DM - SSI, A1C at 8.2 showing poor control.  - on Lantus 40 U (home dose), SSI - CBGs 150-170  Anemia - unknown baseline, stable  AKI  - unknown baseline, likely worsening renal function in the setting of dehydration due to poor po intake and septic shock - had contrasted CT for PE on 4/15 so possible contrast induced as well - long standing DM can contribute - Cr improving now,   Diet: Diet Carb Modified Fluid consistency:: Thin; Room service appropriate?: Yes Fluids: NS DVT Prophylaxis: Eliquis  Code Status: Full Code Family Communication: no family at bedside this morning Disposition Plan: floor transfer this afternoon if stable in am  Consultants:  General surgery   Cardiology   IR  PCCM  Procedures:  Percutaneous cholecystostomy 4/20    Antibiotics Zosyn 4/19 >>   Studies  1. Ct Abdomen Pelvis Wo Contrast 10/05/2014 1. Cholelithiasis with gallbladder distention, gallbladder wall thickening and pericholecystic inflammatory changes most consistent with acute cholecystitis.   2. US Abdomen Complete 10/05/2014 Consistent with acute cholecystitis. Gallstones. Wall thickening. Small amount appear cholecystic fluid. Positive Murphy sign.   3. CXR 4/20 Mild RIGHT basilar atelectasis. 4. CXR 4/20 Left IJ  catheter tip projects over the proximal SVC however extension into the azygos vein is not excluded. Consider correlation with a lateral view. Hypoaeration with interstitial and vascular crowding. Lung base opacities, favor atelectasis. 5. IR Perc cholecystostomy 4/20 Successful cholecystostomy. This needs to remain in place at least 6 weeks.  Objective  Filed Vitals:   10/08/14 2344 10/09/14 0200 10/09/14 0400 10/09/14 0600  BP:  117/50 106/54 121/54  Pulse:      Temp: 97.8 F (36.6 C)  98.5 F (36.9 C)   TempSrc: Oral  Oral   Resp:  Height:      Weight:      SpO2:  96% 96% 95%    Intake/Output Summary (Last 24 hours) at 10/09/14 0725 Last data filed at 10/09/14 0600  Gross per 24 hour  Intake 1539.39 ml  Output    840 ml  Net 699.39 ml   Filed Weights   10/05/14 1613 10/06/14 1900 10/08/14 0500  Weight: 117.935 kg (260 lb) 117.2 kg (258 lb 6.1 oz) 119.8 kg (264 lb 1.8 oz)    Exam:  General:  NAD, pleasant, [place] in place  HEENT: no scleral icterus noticed, PERRL  Cardiovascular: RRR without MRG, 2+ peripheral pulses, trace LE edema  Respiratory: good air movement, no wheezing, no crackles, no rales  Abdomen: soft, no tenderness but "pressure" to palpation RUQ, BS +, drain in place   MSK/Extremities: no clubbing/cyanosis, no joint swelling  Skin: no rashes  Neuro: non focal, A x O x 4  Data Reviewed: Basic Metabolic Panel:  Recent Labs Lab 10/06/14 0445 10/07/14 0558 10/07/14 1900 10/08/14 0530 10/09/14 0500  NA 137 134* 134* 137 138  K 3.5 4.4 3.5 4.0 4.0  CL 103 102 101 102 105  CO2 GLUCOSE 120* 222* 213* 203* 153*  BUN 52* 57* 52* 48* 38*  CREATININE 2.30* 2.88* 2.42* 2.04* 1.71*  CALCIUM 8.0* 7.7* 7.7* 8.0* 8.0*  MG  --   --   --  2.0  --   PHOS  --   --   --  3.2  --    Liver Function Tests:  Recent Labs Lab 10/05/14 1640 10/06/14 0445 10/07/14 0558 10/08/14 0530  AST 34 34 50* 29  ALT 25 26 35 27  ALKPHOS 76  74 87 72  BILITOT 2.8* 2.5* 2.4* 1.1  PROT 6.6 6.0 6.3 5.8*  ALBUMIN 3.0* 2.7* 2.6* 2.4*    Recent Labs Lab 10/05/14 1640  LIPASE 25   CBC:  Recent Labs Lab 10/05/14 1640 10/06/14 0445 10/07/14 0558 10/08/14 0530 10/09/14 0500  WBC 11.7* 10.5 16.9* 11.7* 8.2  NEUTROABS 9.7*  --   --   --   --   HGB 10.6* 9.5* 10.3* 9.4* 9.0*  HCT 30.3* 27.8*  27.7* 30.4* 27.3* 27.3*  MCV 89.1 90.8 92.7 92.2 93.2  PLT 174 167 228 254 274   Cardiac Enzymes:  Recent Labs Lab 10/06/14 1957 10/07/14 0029 10/07/14 1045  TROPONINI 0.05* 0.05* 0.04*   CBG:  Recent Labs Lab 10/08/14 1221 10/08/14 1558 10/08/14 2244 10/08/14 2339 10/09/14 0408  GLUCAP 88 112* 149* 177* 155*    Recent Results (from the past 240 hour(s))  Surgical pcr screen     Status: None   Collection Time: 10/06/14  8:06 AM  Result Value Ref Range Status   MRSA, PCR NEGATIVE NEGATIVE Final   Staphylococcus aureus NEGATIVE NEGATIVE Final    Comment:        The Xpert SA Assay (FDA approved for NASAL specimens in patients over 79 years of age), is one component of a comprehensive surveillance program.  Test performance has been validated by [facility] for patients greater than or equal to 57 year old. It is not intended to diagnose infection nor to guide or monitor treatment.   Body fluid culture     Status: None (Preliminary result)   Collection Time: 10/06/14  4:15 PM  Result Value Ref Range Status   Specimen Description BILE  Final   Special Requests NONE  Final   Gram Stain   Final    NO WBC SEEN FEW GRAM NEGATIVE RODS Gram Stain Report Called to,Read Back By and Verified With: Gram Stain Report Called to,Read Back By and Verified With: Caesar Chestnut RN 10/07/14 8:40AM BY MILSH Performed at [facility]   Final    Culture reincubated for better growth Performed at [facility]    Report Status PENDING  Incomplete     Scheduled Meds: . allopurinol  300 mg Oral  Daily  . antiseptic oral rinse  7 mL Mouth Rinse q12n4p  . chlorhexidine  15 mL Mouth Rinse BID  . folic acid  1 mg Oral Daily  . insulin aspart  0-5 Units Subcutaneous QHS  . insulin aspart  0-9 Units Subcutaneous TID WC  . insulin glargine  40 Units Subcutaneous Daily  . multivitamin with minerals  1 tablet Oral Daily  . pantoprazole  40 mg Oral Daily  . piperacillin-tazobactam (ZOSYN)  IV  3.375 g Intravenous 3 times per day  . thiamine  100 mg Oral Daily   Continuous Infusions: . sodium chloride 10 mL/hr at 10/06/14 2322  . heparin 2,250 Units/hr (10/09/14 0517)  . norepinephrine (LEVOPHED) Adult infusion Stopped (10/08/14 1211)    Pamella Pert, MD [facility] Pager [phone]. If 7 PM - 7 AM, please contact night-coverage at [email or web address], password [facility] 10/09/2014, 7:25 AM  LOS: 4 days

## 2014-10-09 NOTE — Progress Notes (Addendum)
ANTICOAGULATION CONSULT NOTE - Follow Up Consult  Pharmacy Consult for Heparin - transition to Eliquis 4/23 AM Indication: atrial fibrillation    No Known Allergies  Patient Measurements: Height: 5\' 8"  (172.7 cm) Weight: 264 lb 1.8 oz (119.8 kg) IBW/kg (Calculated) : 68.4 Heparin Dosing Weight: 96 kg  Vital Signs: Temp: 98.5 F (36.9 C) (04/23 0400) Temp Source: Oral (04/23 0400) BP: 121/54 mmHg (04/23 0600)  Labs:  Recent Labs  10/06/14 1957 10/07/14 0029  10/07/14 0558 10/07/14 1045 10/07/14 1900  10/08/14 0530 10/08/14 1110 10/08/14 2027 10/09/14 0500  HGB  --   --   < > 10.3*  --   --   --  9.4*  --   --  9.0*  HCT  --   --   --  30.4*  --   --   --  27.3*  --   --  27.3*  PLT  --   --   --  228  --   --   --  254  --   --  274  HEPARINUNFRC  --   --   --   --   --   --   < >  --  0.19* 0.24* 0.65  CREATININE  --   --   < > 2.88*  --  2.42*  --  2.04*  --   --  1.71*  TROPONINI 0.05* 0.05*  --   --  0.04*  --   --   --   --   --   --   < > = values in this interval not displayed. Estimated Creatinine Clearance: 52 mL/min (by C-G formula based on Cr of 1.71).  Medications:  Infusions:  . sodium chloride 10 mL/hr at 10/06/14 2322  . heparin 2,250 Units/hr (10/09/14 0517)  . norepinephrine (LEVOPHED) Adult infusion Stopped (10/08/14 1211)   Assessment: 69 year old male admitted for acute cholecystitis 4/19, s/p perc chole drain placement 4/20.  PMH includes new Afib seen on 4/15, prescribed Eliquis but never picked up. He has been cleared by surgery/IR to resume anticoagulation post-op.  Pharmacy consulted to begin heparin IV with poor renal function, to transition to Eliquis when improved.  PTA Apixaban 5mg  BID but had not yet started per notes - appropriate dose as only meets 1 of the criteria for dose reduction of Age>80, Scr > 1.5 and Wt<60kg  Today, 10/09/2014  To discontinue IV heparin and restart apixaban per cardiology orders  CBC: Hgb decreased  slightly to 9, Plt stable/WNL  No bleeding or complications reported.  SCr improved with CrCl ~ 44 ml/min.  Goal of Therapy:  Monitor renal function, bleeding   Plan: 1) Discontinue IV heparin 2) Restart patient's home apixaban 5mg  PO BID 3) Will follow renal function 4) Will educate patient on apixaban prior to discharge   Hessie KnowsJustin M Tayna Smethurst, PharmD, BCPS Pager [phone] 10/09/2014 7:31 AM

## 2014-10-09 NOTE — Progress Notes (Signed)
  Subjective: Having some congestion/phlegm. No abd c/o. No n/v. Reports BM  Objective: Vital signs in last 24 hours: Temp:  [97.5 F (36.4 C)-98.5 F (36.9 C)] 98.5 F (36.9 C) (04/23 0400) Resp:  [6-28] 19 (04/23 0600) BP: (91-129)/(30-88) 121/54 mmHg (04/23 0600) SpO2:  [93 %-100 %] 95 % (04/23 0600) Last BM Date: 10/08/14  Intake/Output from previous day: 04/22 0701 - 04/23 0700 In: 1539.4 [P.O.:180; I.V.:1209.4; IV Piggyback:150] Out: 840 [Urine:550; Drains:290] Intake/Output this shift:    Alert, not ill appearing.  Soft, obese, nt; RUQ GB drain - bilious  Lab Results:   Recent Labs  10/08/14 0530 10/09/14 0500  WBC 11.7* 8.2  HGB 9.4* 9.0*  HCT 27.3* 27.3*  PLT 254 274   BMET  Recent Labs  10/08/14 0530 10/09/14 0500  NA 137 138  K 4.0 4.0  CL 102 105  CO2 24 27  GLUCOSE 203* 153*  BUN 48* 38*  CREATININE 2.04* 1.71*  CALCIUM 8.0* 8.0*   PT/INR No results for input(s): LABPROT, INR in the last 72 hours. ABG No results for input(s): PHART, HCO3 in the last 72 hours.  Invalid input(s): PCO2, PO2  Studies/Results: No results found.  Anti-infectives: Anti-infectives    Start     Dose/Rate Route Frequency Ordered Stop   10/06/14 0130  piperacillin-tazobactam (ZOSYN) IVPB 3.375 g     3.375 g 12.5 mL/hr over 240 Minutes Intravenous 3 times per day 10/06/14 0128     10/05/14 2100  piperacillin-tazobactam (ZOSYN) IVPB 3.375 g     3.375 g 100 mL/hr over 30 Minutes Intravenous  Once 10/05/14 2049 10/05/14 2143      Assessment/Plan: Principal Problem:   Cholecystitis Active Problems:   Diabetes mellitus without complication   HTN (hypertension)   Hypotension   Anemia   CAD (coronary artery disease)   Acute cholecystitis   Dyspnea   Septic shock   Paroxysmal atrial fibrillation  S/p cholecystostomy tube  Cont tube to gravity drainage Pt education regarding drain care i think he is stable for tx to floor GNR in gb fluid - f/u  cx Cont IV abx - will need total of 2 weeks; can transition to oral on dc Anticoagulation per primary team Cr normalizing No surgery for minimum of 6-8 weeks  Broderic Bara M. Andrey CampanileWilson, MD, FACS General, Bariatric, & Minimally Invasive Surgery [facility], [place]   LOS: 4 days    Atilano InaWILSON,Yvetta Drotar M 10/09/2014

## 2014-10-09 NOTE — Progress Notes (Signed)
Patient Name: Larry [name] Date of Encounter: 10/09/2014     Principal Problem:   Cholecystitis Active Problems:   Diabetes mellitus without complication   HTN (hypertension)   Hypotension   Anemia   CAD (coronary artery disease)   Acute cholecystitis   Dyspnea   Septic shock   Paroxysmal atrial fibrillation    SUBJECTIVE  The patient has been stable overnight.  Remains in normal sinus rhythm.  Occasional PVCs.  No chest pain or shortness of breath.  .Blood pressure improved.  He is off pressors.  He sat up in a chair yesterday.  CURRENT MEDS . allopurinol  300 mg Oral Daily  . antiseptic oral rinse  7 mL Mouth Rinse q12n4p  . chlorhexidine  15 mL Mouth Rinse BID  . folic acid  1 mg Oral Daily  . insulin aspart  0-5 Units Subcutaneous QHS  . insulin aspart  0-9 Units Subcutaneous TID WC  . insulin glargine  40 Units Subcutaneous Daily  . multivitamin with minerals  1 tablet Oral Daily  . pantoprazole  40 mg Oral Daily  . piperacillin-tazobactam (ZOSYN)  IV  3.375 g Intravenous 3 times per day  . thiamine  100 mg Oral Daily    OBJECTIVE  Filed Vitals:   10/08/14 2344 10/09/14 0200 10/09/14 0400 10/09/14 0600  BP:  117/50 106/54 121/54  Pulse:      Temp: 97.8 F (36.6 C)  98.5 F (36.9 C)   TempSrc: Oral  Oral   Resp:  6 17 19   Height:      Weight:      SpO2:  96% 96% 95%    Intake/Output Summary (Last 24 hours) at 10/09/14 0708 Last data filed at 10/09/14 0600  Gross per 24 hour  Intake 1539.39 ml  Output    840 ml  Net 699.39 ml   Filed Weights   10/05/14 1613 10/06/14 1900 10/08/14 0500  Weight: 260 lb (117.935 kg) 258 lb 6.1 oz (117.2 kg) 264 lb 1.8 oz (119.8 kg)    PHYSICAL EXAM  General: Pleasant, NAD.  Internal jugular catheters left neck Neuro: Alert and oriented X 3. Moves all extremities spontaneously. Psych: Normal affect. HEENT:  Normal  Neck: Supple without bruits or JVD. Lungs:  Resp regular and unlabored, CTA. Heart: RRR no  s3, s4, or murmurs. Abdomen: Soft, non-tender, non-distended, bowel sounds are soft Extremities: No clubbing, cyanosis or edema. DP/PT/Radials 2+ and equal bilaterally.  Accessory Clinical Findings  CBC  Recent Labs  10/08/14 0530 10/09/14 0500  WBC 11.7* 8.2  HGB 9.4* 9.0*  HCT 27.3* 27.3*  MCV 92.2 93.2  PLT 254 274   Basic Metabolic Panel  Recent Labs  10/08/14 0530 10/09/14 0500  NA 137 138  K 4.0 4.0  CL 102 105  CO2 24 27  GLUCOSE 203* 153*  BUN 48* 38*  CREATININE 2.04* 1.71*  CALCIUM 8.0* 8.0*  MG 2.0  --   PHOS 3.2  --    Liver Function Tests  Recent Labs  10/07/14 0558 10/08/14 0530  AST 50* 29  ALT 35 27  ALKPHOS 87 72  BILITOT 2.4* 1.1  PROT 6.3 5.8*  ALBUMIN 2.6* 2.4*   No results for input(s): LIPASE, AMYLASE in the last 72 hours. Cardiac Enzymes  Recent Labs  10/06/14 1957 10/07/14 0029 10/07/14 1045  TROPONINI 0.05* 0.05* 0.04*   BNP Invalid input(s): POCBNP D-Dimer No results for input(s): DDIMER in the last 72 hours. Hemoglobin A1C No results  for input(s): HGBA1C in the last 72 hours. Fasting Lipid Panel No results for input(s): CHOL, HDL, LDLCALC, TRIG, CHOLHDL, LDLDIRECT in the last 72 hours. Thyroid Function Tests No results for input(s): TSH, T4TOTAL, T3FREE, THYROIDAB in the last 72 hours.  Invalid input(s): FREET3  TELE   normal sinus rhythm  2D echo - Left ventricle: The cavity size was normal. Systolic function was normal. The estimated ejection fraction was in the range of 55% to 60%. Wall motion was normal; there were no regional wall motion abnormalities. - Mitral valve: There was mild regurgitation. - Left atrium: The atrium was mildly dilated. - Right ventricle: The cavity size was dilated. Wall thickness was normal. - Right atrium: The atrium was mildly to moderately dilated.   Radiology/Studies  Ct Abdomen Pelvis Wo Contrast  10/05/2014   CLINICAL DATA:  Upper abdominal pain since last  Thursday. Nausea and vomiting.  EXAM: CT ABDOMEN AND PELVIS WITHOUT CONTRAST  TECHNIQUE: Multidetector CT imaging of the abdomen and pelvis was performed following the standard protocol without IV contrast.  COMPARISON:  10/01/2014  FINDINGS: There is bibasilar atelectasis.  There is a punctate nonobstructing left renal calculus. No obstructive uropathy. No perinephric stranding is seen. The kidneys are symmetric in size without evidence for exophytic mass. The bladder is unremarkable.  The liver demonstrates no focal abnormality. The gallbladder is distended with cholelithiasis, gallbladder wall thickening and pericholecystic inflammatory changes most consistent with acute cholecystitis. The spleen demonstrates no focal abnormality. The adrenal glands and pancreas are normal.  There is a small hiatal hernia. The unopacified stomach, duodenum, small intestine and large intestine are unremarkable, but evaluation is limited by lack of oral contrast. There is no pneumoperitoneum, pneumatosis, or portal venous gas. There is no abdominal or pelvic free fluid. There is no lymphadenopathy.  The abdominal aorta is normal in caliber with atherosclerosis.  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 There is thoracolumbar spine spondylosis most severe at L3-4 and L4-5 with disc space narrowing and bilateral severe facet arthropathy.  IMPRESSION: 1. Cholelithiasis with gallbladder distention, gallbladder wall thickening and pericholecystic inflammatory changes most consistent with acute cholecystitis.   Electronically Signed   By: Elige Ko   On: 10/05/2014 19:08   Ct Angio Chest Pe W/cm &/or Wo Cm  10/01/2014   CLINICAL DATA:  Left upper quadrant pain for 3 hours with paleness and diaphoresis.  EXAM: CT ANGIOGRAPHY CHEST, ABDOMEN AND PELVIS  TECHNIQUE: Multidetector CT imaging through the chest, abdomen and pelvis was performed using the standard protocol during bolus administration of intravenous contrast. Multiplanar  reconstructed images and MIPs were obtained and reviewed to evaluate the vascular anatomy.  CONTRAST:  OMNIPAQUE IOHEXOL 350 MG/ML SOLN  COMPARISON:  None.  FINDINGS: CTA CHEST FINDINGS  THORACIC INLET/BODY WALL:  No acute abnormality.  MEDIASTINUM:  Normal heart size. No pericardial effusion. There is coronary atherosclerosis which is diffuse throughout the left and right circulation. No aortic aneurysm, dissection, or intramural hematoma. Small sliding hiatal hernia. No evidence of pulmonary embolism.  LUNG WINDOWS:  No consolidation.  No effusion.  No suspicious pulmonary nodule.  OSSEOUS:  See below  Review of the MIP images confirms the above findings.  CTA ABDOMEN AND PELVIS FINDINGS  BODY WALL: No contributory findings.  Liver: Hepatic steatosis.  No focal abnormality.  Biliary: Cholelithiasis. No evidence of biliary obstruction or inflammation.  Pancreas: Unremarkable.  Spleen: Unremarkable.  Adrenals: Unremarkable.  Kidneys and ureters: 2 punctate calculi in the lower pole left kidney,  nonobstructive. No ureteral calculus or hydronephrosis  Bladder: Partly obscured by streak artifact from of the right hip prosthesis. No pathologic findings  Reproductive: Negative for age.  Bowel: Distal colonic diverticulosis. No bowel inflammation or obstruction. Negative appendix.  Retroperitoneum: No mass or adenopathy.  Peritoneum: No ascites or pneumoperitoneum.  Vascular: Standard aortic branching. No aneurysm, dissection, or wall thickening. There is scattered atherosclerotic calcification of the aorta and branch vessels without notable stenosis.  OSSEOUS: Right hip arthroplasty. There is near bridging bulky heterotopic ossification about the joint.  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 This is presumably posttraumatic given unilaterality.  Mid thoracic vertebral body compression deformities appear chronic. There is advanced and diffuse spondylosis. Advanced lumbar facet osteoarthritis with L4-5 slip  and foraminal stenosis.  Review of the MIP images confirms the above findings.  IMPRESSION: 1. No acute findings, including aortic dissection. 2. Extensive coronary atherosclerosis. 3. Hepatic steatosis. 4. Cholelithiasis. 5. Additional chronic findings are noted above.   Electronically Signed   By: Marnee Spring M.D.   On: 10/01/2014 03:54   US Abdomen Complete  10/05/2014   CLINICAL DATA:  Acute onset of right upper quadrant pain 6 days ago. Symptoms are worsening.  EXAM: ULTRASOUND ABDOMEN COMPLETE  COMPARISON:  CT abdomen same day.  FINDINGS: Gallbladder: Gallstones dependent in the gallbladder. Mild wall thickening. Positive Murphy sign. Small amount of adjacent fluid. Findings all consistent with acute cholecystitis.  Common bile duct: Diameter: 4 mm, normal  Liver: Diffuse fatty change.  No focal lesion seen.  IVC: No abnormality visualized.  Pancreas: Poorly seen because of overlying bowel gas.  Spleen: Poorly seen because of overlying bowel gas.  Right Kidney: Length: 11.0 cm. Echogenicity within normal limits. No mass or hydronephrosis visualized.  Left Kidney: Length: 10.9 cm. Echogenicity within normal limits. No mass or hydronephrosis visualized.  Abdominal aorta: No aneurysm visualized.  Other findings: None.  IMPRESSION: Consistent with acute cholecystitis. Gallstones. Wall thickening. Small amount appear cholecystic fluid. Positive Murphy sign.   Electronically Signed   By: Paulina Fusi M.D.   On: 10/05/2014 19:48   Ir Perc Cholecystostomy  10/07/2014   CLINICAL DATA:  Acute cholecystitis with sepsis  EXAM: CHOLECYSTOSTOMY  FLUOROSCOPY TIME:  48 seconds.  MEDICATIONS AND MEDICAL HISTORY: Versed 0 mg, Fentanyl 0 mcg.  Additional Medications: None  ANESTHESIA/SEDATION: None  CONTRAST:  5 cc Omnipaque 300  PROCEDURE: The procedure, risks, benefits, and alternatives were explained to the patient. Questions regarding the procedure were encouraged and answered. The patient understands and consents  to the procedure.  The right upper quadrant was prepped with Betadine in a sterile fashion, and a sterile drape was applied covering the operative field. A sterile gown and sterile gloves were used for the procedure.  A 21 gauge needle was inserted into the gallbladder lumen under sonographic guidance and via transhepatic approach. It was removed over a 018 wire, which was upsized to a 3-J. A 10-French drain was advanced over the wire and coiled in the gallbladder lumen. It was sewn to the skin after being string fixed.  FINDINGS: The cystic duct is occluded.  COMPLICATIONS: None  IMPRESSION: Successful cholecystostomy. This needs to remain in place at least 6 weeks.   Electronically Signed   By: Jolaine Click M.D.   On: 10/07/2014 08:44   Dg Chest Port 1 View  10/06/2014   CLINICAL DATA:  Central line placement.  EXAM: PORTABLE CHEST - 1 VIEW  COMPARISON:  10/06/2014  FINDINGS: Interval left IJ central  venous catheter placement. The tip projects over the region of the proximal SVC however I cannot exclude extension into the azygos vein. Prominent cardiomediastinal contours. Hypoaeration with interstitial and vascular crowding. Bibasilar opacities. Small effusions not excluded. No pneumothorax. Multilevel degenerative change. Right lateral approach percutaneous cholecystostomy tube.  IMPRESSION: Left IJ catheter tip projects over the proximal SVC however extension into the azygos vein is not excluded. Consider correlation with a lateral view.  Hypoaeration with interstitial and vascular crowding. Lung base opacities, favor atelectasis.   Electronically Signed   By: Jearld Lesch M.D.   On: 10/06/2014 22:33   Dg Chest Port 1 View  10/06/2014   CLINICAL DATA:  Shortness of breath, dyspnea, post percutaneous cholecystostomy earlier today, diabetes, hypertension, coronary artery disease post MI, atrial fibrillation  EXAM: PORTABLE CHEST - 1 VIEW  COMPARISON:  Portable exam 1648 hours compared to 10/01/2014   FINDINGS: Enlargement of cardiac silhouette.  Mediastinal contours and pulmonary vascularity normal.  Mild RIGHT basilar atelectasis, new.  Remaining lungs clear.  No pleural effusion or pneumothorax.  RIGHT glenohumeral degenerative changes and probable chronic rotator cuff tear.  IMPRESSION: Mild RIGHT basilar atelectasis.   Electronically Signed   By: Ulyses Southward M.D.   On: 10/06/2014 17:10   Dg Chest Portable 1 View  10/01/2014   CLINICAL DATA:  Abdominal pain, left upper quadrant  EXAM: PORTABLE CHEST - 1 VIEW  COMPARISON:  None currently available  FINDINGS: Normal heart size and mediastinal contours for technique. No acute infiltrate or edema. No effusion or pneumothorax. No acute osseous findings.  IMPRESSION: No active disease.   Electronically Signed   By: Marnee Spring M.D.   On: 10/01/2014 03:16   Ct Angio Abd/pel W/ And/or W/o  10/01/2014   CLINICAL DATA:  Left upper quadrant pain for 3 hours with paleness and diaphoresis.  EXAM: CT ANGIOGRAPHY CHEST, ABDOMEN AND PELVIS  TECHNIQUE: Multidetector CT imaging through the chest, abdomen and pelvis was performed using the standard protocol during bolus administration of intravenous contrast. Multiplanar reconstructed images and MIPs were obtained and reviewed to evaluate the vascular anatomy.  CONTRAST:  OMNIPAQUE IOHEXOL 350 MG/ML SOLN  COMPARISON:  None.  FINDINGS: CTA CHEST FINDINGS  THORACIC INLET/BODY WALL:  No acute abnormality.  MEDIASTINUM:  Normal heart size. No pericardial effusion. There is coronary atherosclerosis which is diffuse throughout the left and right circulation. No aortic aneurysm, dissection, or intramural hematoma. Small sliding hiatal hernia. No evidence of pulmonary embolism.  LUNG WINDOWS:  No consolidation.  No effusion.  No suspicious pulmonary nodule.  OSSEOUS:  See below  Review of the MIP images confirms the above findings.  CTA ABDOMEN AND PELVIS FINDINGS  BODY WALL: No contributory findings.  Liver: Hepatic  steatosis.  No focal abnormality.  Biliary: Cholelithiasis. No evidence of biliary obstruction or inflammation.  Pancreas: Unremarkable.  Spleen: Unremarkable.  Adrenals: Unremarkable.  Kidneys and ureters: 2 punctate calculi in the lower pole left kidney, nonobstructive. No ureteral calculus or hydronephrosis  Bladder: Partly obscured by streak artifact from of the right hip prosthesis. No pathologic findings  Reproductive: Negative for age.  Bowel: Distal colonic diverticulosis. No bowel inflammation or obstruction. Negative appendix.  Retroperitoneum: No mass or adenopathy.  Peritoneum: No ascites or pneumoperitoneum.  Vascular: Standard aortic branching. No aneurysm, dissection, or wall thickening. There is scattered atherosclerotic calcification of the aorta and branch vessels without notable stenosis.  OSSEOUS: Right hip arthroplasty. There is near bridging bulky heterotopic ossification about the joint.  Costovertebral  ankylosis diffusely on the left. This is presumably posttraumatic given unilaterality.  Mid thoracic vertebral body compression deformities appear chronic. There is advanced and diffuse spondylosis. 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  Review of the MIP images confirms the above findings.  IMPRESSION: 1. No acute findings, including aortic dissection. 2. Extensive coronary atherosclerosis. 3. Hepatic steatosis. 4. Cholelithiasis. 5. Additional chronic findings are noted above.   Electronically Signed   By: Marnee SpringJonathon  Watts M.D.   On: 10/01/2014 03:54    ASSESSMENT AND PLAN  1. Acute cholecystitis 2. Ischemic heart disease.  No chest pain.  Normal LV function by 2-D echo. 3. PAF maintaining NSR.  Recommend restarting Eliquis after surgery. This patients CHA2DS2-VASc Score and unadjusted Ischemic Stroke Rate (% per year) is equal to 4 % stroke rate/year from a score of 4 Above score calculated as 1 point each if present [CHF, HTN, DM,  Vascular=MI/PAD/Aortic Plaque, Age if 65-74, or Male] Above score calculated as 2 points each if present [Age > 75, or Stroke/TIA/TE] 4. HTN - stable 5. 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 Continue ASA. 6. DM 7. Dyslipidemia .  Restart statin after acute illness is over.  Signed, Cassell Clementhomas Adreana Coull MD

## 2014-10-10 LAB — CBC
HEMATOCRIT: 28 % — AB (ref 39.0–52.0)
Hemoglobin: 9.4 g/dL — ABNORMAL LOW (ref 13.0–17.0)
MCH: 31.5 pg (ref 26.0–34.0)
MCHC: 33.6 g/dL (ref 30.0–36.0)
MCV: 94 fL (ref 78.0–100.0)
Platelets: 295 10*3/uL (ref 150–400)
RBC: 2.98 MIL/uL — ABNORMAL LOW (ref 4.22–5.81)
RDW: 16.3 % — AB (ref 11.5–15.5)
WBC: 7.4 10*3/uL (ref 4.0–10.5)

## 2014-10-10 LAB — BODY FLUID CULTURE: GRAM STAIN: NONE SEEN

## 2014-10-10 LAB — GLUCOSE, CAPILLARY
Glucose-Capillary: 157 mg/dL — ABNORMAL HIGH (ref 70–99)
Glucose-Capillary: 144 mg/dL — ABNORMAL HIGH (ref 70–99)
Glucose-Capillary: 158 mg/dL — ABNORMAL HIGH (ref 70–99)
Glucose-Capillary: 114 mg/dL — ABNORMAL HIGH (ref 70–99)

## 2014-10-10 LAB — COMPREHENSIVE METABOLIC PANEL
ALBUMIN: 2.4 g/dL — AB (ref 3.5–5.2)
ALK PHOS: 60 U/L (ref 39–117)
ALT: 26 U/L (ref 0–53)
AST: 28 U/L (ref 0–37)
Anion gap: 2 — ABNORMAL LOW (ref 5–15)
BUN: 30 mg/dL — ABNORMAL HIGH (ref 6–23)
CHLORIDE: 109 mmol/L (ref 96–112)
CO2: 27 mmol/L (ref 19–32)
Calcium: 8 mg/dL — ABNORMAL LOW (ref 8.4–10.5)
Creatinine, Ser: 1.59 mg/dL — ABNORMAL HIGH (ref 0.50–1.35)
GFR calc Af Amer: 50 mL/min — ABNORMAL LOW (ref >90)
GFR calc non Af Amer: 43 mL/min — ABNORMAL LOW (ref >90)
GLUCOSE: 167 mg/dL — AB (ref 70–99)
Potassium: 3.9 mmol/L (ref 3.5–5.1)
Sodium: 138 mmol/L (ref 135–145)
TOTAL PROTEIN: 5.6 g/dL — AB (ref 6.0–8.3)
Total Bilirubin: 1 mg/dL (ref 0.3–1.2)

## 2014-10-10 NOTE — Discharge Instructions (Signed)

## 2014-10-10 NOTE — Progress Notes (Addendum)
[facility] PROGRESS NOTE Interim History: 69 year old with past medical pulse CAD, A. fib on her request, uncontrolled diabetes mellitus that comes in for abdominal pain was found to have acute cholecystitis. Ultrasound positive for acute cholecystitis Required ICU transfer started on IV fluid and pressors for 48 hours now off. Surgery was consulted and recommended IR consult. Percutaneous drain placed on 10/06/2014 grams data showed gram-negative rods sensitivities still pending.  Assessment/Plan: Septic shock due to acute cholecystitis: - Off pressors, cortisol was checked and showed appropriate response.  Acute cholecystitis with choledocholithiasis: - Status post percutaneous drain placement, cultures grew Escherichia coli, currently on Zosyn, will need 14 days treatment starting from 10/06/2014. Follow-up with surgery with cholecystectomy 6 weeks.  ElevatedTroponins: - due to demand ischemia from sepsis. - Had been off his Plavix and his cardiac enzymes had a mild elevation - Cardiology was consulted and he has not been taking his Plavix, 2-D echo was done on 10/07/2014 that showed an EF of 55% no motion abnormalities. - Can restart Eliquis on discharge.  Uncontrolled diabetes mellitus type 2: Continue Lantus and sliding scale.  Normocytic anemia: Unknown baseline seems to be stable.  Acute kidney injury: Unknown baseline worsening renal function due to septic shock in the setting of contrast-induced nephropathy. Creatinine is improving slowly with hydration. We'll continue to monitor.  Code Status: Full Code Family Communication: no family at bedside this morning Disposition Plan: floor transfer this afternoon if stable in am  Consultants:  General surgery   Cardiology   IR  PCCM  Procedures: - drain placement 4/20, afterwards he developed septic shock requiring ICU transfer and Levophed.  - 4/21 clinically improving, still requiring pressors - 4/22  off Levophed - 4/23 transfer to floor    Antibiotics Zosyn 4/19 >>   Studies  1. Ct Abdomen Pelvis Wo Contrast 10/05/2014 1. Cholelithiasis with gallbladder distention, gallbladder wall thickening and pericholecystic inflammatory changes most consistent with acute cholecystitis.  2. US Abdomen Complete 10/05/2014 Consistent with acute cholecystitis. Gallstones. Wall thickening. Small amount appear cholecystic fluid. Positive Murphy sign.  3. CXR 4/20 Mild RIGHT basilar atelectasis. 4. CXR 4/20 Left IJ catheter tip projects over the proximal SVC however extension into the azygos vein is not excluded. Consider correlation with a lateral view. Hypoaeration with interstitial and vascular crowding. Lung base opacities, favor atelectasis. 5. IR Perc cholecystostomy 4/20 Successful cholecystostomy. This needs to remain in place at least 6 weeks.  HPI/Subjective: No complains  Objective: Filed Vitals:   10/09/14 1155 10/09/14 1300 10/09/14 2146 10/10/14 0537  BP: 115/58 123/60 132/62 120/61  Pulse: 75 78 80 73  Temp: 97.5 F (36.4 C) 97.3 F (36.3 C) 97.9 F (36.6 C) 98 F (36.7 C)  TempSrc: Oral Oral Oral Oral  Resp: Height:      Weight:      SpO2: 96% 96% 97% 95%    Intake/Output Summary (Last 24 hours) at 10/10/14 1135 Last data filed at 10/10/14 0925  Gross per 24 hour  Intake 1875.67 ml  Output    555 ml  Net 1320.67 ml   Filed Weights   10/05/14 1613 10/06/14 1900 10/08/14 0500  Weight: 117.935 kg (260 lb) 117.2 kg (258 lb 6.1 oz) 119.8 kg (264 lb 1.8 oz)    Exam:  General: Alert, awake, oriented x3, in no acute distress.  HEENT: No bruits, no goiter.  Heart: Regular rate and rhythm. Lungs: Good air movement, clear Abdomen: Soft, nontender, nondistended, positive bowel sounds.  Neuro: Grossly intact, nonfocal.   Data Reviewed: Basic Metabolic Panel:  Recent Labs Lab 10/07/14 0558 10/07/14 1900 10/08/14 0530 10/09/14 0500  10/10/14 0419  NA 134* 134* 137 138 138  K 4.4 3.5 4.0 4.0 3.9  CL 102 101 102 105 109  CO2 GLUCOSE 222* 213* 203* 153* 167*  BUN 57* 52* 48* 38* 30*  CREATININE 2.88* 2.42* 2.04* 1.71* 1.59*  CALCIUM 7.7* 7.7* 8.0* 8.0* 8.0*  MG  --   --  2.0  --   --   PHOS  --   --  3.2  --   --    Liver Function Tests:  Recent Labs Lab 10/05/14 1640 10/06/14 0445 10/07/14 0558 10/08/14 0530 10/10/14 0419  AST 34 34 50* 29 28  ALT 25 26 35 27 26  ALKPHOS 76 74 87 72 60  BILITOT 2.8* 2.5* 2.4* 1.1 1.0  PROT 6.6 6.0 6.3 5.8* 5.6*  ALBUMIN 3.0* 2.7* 2.6* 2.4* 2.4*    Recent Labs Lab 10/05/14 1640  LIPASE 25   No results for input(s): AMMONIA in the last 168 hours. CBC:  Recent Labs Lab 10/05/14 1640 10/06/14 0445 10/07/14 0558 10/08/14 0530 10/09/14 0500 10/10/14 0419  WBC 11.7* 10.5 16.9* 11.7* 8.2 7.4  NEUTROABS 9.7*  --   --   --   --   --   HGB 10.6* 9.5* 10.3* 9.4* 9.0* 9.4*  HCT 30.3* 27.8*  27.7* 30.4* 27.3* 27.3* 28.0*  MCV 89.1 90.8 92.7 92.2 93.2 94.0  PLT 174 167 228 254 274 295   Cardiac Enzymes:  Recent Labs Lab 10/06/14 1957 10/07/14 0029 10/07/14 1045  TROPONINI 0.05* 0.05* 0.04*   BNP (last 3 results) No results for input(s): BNP in the last 8760 hours.  ProBNP (last 3 results) No results for input(s): PROBNP in the last 8760 hours.  CBG:  Recent Labs Lab 10/09/14 1202 10/09/14 1750 10/09/14 2145 10/10/14 0737 10/10/14 1133  GLUCAP 146* 136* 158* 157* 144*    Recent Results (from the past 240 hour(s))  Surgical pcr screen     Status: None   Collection Time: 10/06/14  8:06 AM  Result Value Ref Range Status   MRSA, PCR NEGATIVE NEGATIVE Final   Staphylococcus aureus NEGATIVE NEGATIVE Final    Comment:        The Xpert SA Assay (FDA approved for NASAL specimens in patients over 48 years of age), is one component of a comprehensive surveillance program.  Test performance has been validated by [facility] for  patients greater than or equal to 5 year old. It is not intended to diagnose infection nor to guide or monitor treatment.   Body fluid culture     Status: None   Collection Time: 10/06/14  4:15 PM  Result Value Ref Range Status   Specimen Description BILE  Final   Special Requests NONE  Final   Gram Stain   Final    NO WBC SEEN FEW GRAM NEGATIVE RODS Gram Stain Report Called to,Read Back By and Verified With: Gram Stain Report Called to,Read Back By and Verified With: Caesar Chestnut RN 10/07/14 8:40AM BY MILSH Performed at [facility]    Culture   Final    MODERATE ESCHERICHIA COLI Culture reincubated for better growth Performed at [facility]    Report Status 10/10/2014 FINAL  Final     Studies: No results found.  Scheduled Meds: . allopurinol  300 mg Oral Daily  .  antiseptic oral rinse  7 mL Mouth Rinse q12n4p  . apixaban  5 mg Oral BID  . chlorhexidine  15 mL Mouth Rinse BID  . folic acid  1 mg Oral Daily  . insulin aspart  0-5 Units Subcutaneous QHS  . insulin aspart  0-9 Units Subcutaneous TID WC  . insulin glargine  40 Units Subcutaneous Daily  . multivitamin with minerals  1 tablet Oral Daily  . pantoprazole  40 mg Oral Daily  . piperacillin-tazobactam (ZOSYN)  IV  3.375 g Intravenous 3 times per day  . thiamine  100 mg Oral Daily   Continuous Infusions: . sodium chloride 10 mL/hr at 10/06/14 2322    Time Spent: 25 min   Marinda ElkFELIZ ORTIZ, ABRAHAM  [facility] Pager [phone]. If 7PM-7AM, please contact night-coverage at [email or web address], password [email or web address] 10/10/2014, 11:35 AM  LOS: 5 days

## 2014-10-10 NOTE — Progress Notes (Signed)
General Surgery Note  LOS: 5 days  POD -     Assessment/Plan: 1.  Cholecystitis - perc drain  On Zosyn  Perc drain functioning well - 405 cc recorded last 24 hours  He is eating pepperoni pizza - not sure that is on diabetic or gall bladder diet - but he is enjoying it.  2.  DM 3.  HTN 4.  CAD 5.  History of A Fib 6.  Anemia  Hgb - 9.4 - 10/10/2014 7.  Elevated creatinine   Creatinine - 1.59 - 08/11/2014 8.  On Eliquis   Principal Problem:   Cholecystitis Active Problems:   Diabetes mellitus without complication   HTN (hypertension)   Hypotension   Anemia   CAD (coronary artery disease)   Acute cholecystitis   Dyspnea   Septic shock   Paroxysmal atrial fibrillation   Subjective:  .  Doing well.  No complaints.  Enjoying his pepperoni pizza! Objective:   Filed Vitals:   10/10/14 0537  BP: 120/61  Pulse: 73  Temp: 98 F (36.7 C)  Resp: 16     Intake/Output from previous day:  04/23 0701 - 04/24 0700 In: 1808.2 [P.O.:1440; I.V.:218.2; IV Piggyback:150] Out: 605 [Urine:200; Drains:405]  Intake/Output this shift:  Total I/O In: 120 [P.O.:120] Out: -    Physical Exam:   General: Obese WM who is alert and oriented.    HEENT: Normal. Pupils equal. .   Lungs: Clear.   Abdomen: Large   Drain with bile - 405 cc recorded last 24 hours   Lab Results:    Recent Labs  10/09/14 0500 10/10/14 0419  WBC 8.2 7.4  HGB 9.0* 9.4*  HCT 27.3* 28.0*  PLT 274 295    BMET   Recent Labs  10/09/14 0500 10/10/14 0419  NA 138 138  K 4.0 3.9  CL 105 109  CO2 27 27  GLUCOSE 153* 167*  BUN 38* 30*  CREATININE 1.71* 1.59*  CALCIUM 8.0* 8.0*    PT/INR  No results for input(s): LABPROT, INR in the last 72 hours.  ABG  No results for input(s): PHART, HCO3 in the last 72 hours.  Invalid input(s): PCO2, PO2   Studies/Results:  No results found.   Anti-infectives:   Anti-infectives    Start     Dose/Rate Route Frequency Ordered Stop   10/06/14 0130   piperacillin-tazobactam (ZOSYN) IVPB 3.375 g     3.375 g 12.5 mL/hr over 240 Minutes Intravenous 3 times per day 10/06/14 0128     10/05/14 2100  piperacillin-tazobactam (ZOSYN) IVPB 3.375 g     3.375 g 100 mL/hr over 30 Minutes Intravenous  Once 10/05/14 2049 10/05/14 2143      Larry Kinavid [name], MD, FACS Pager: [phone] [facility] Office: [phone] 10/10/2014

## 2014-10-10 NOTE — Progress Notes (Signed)
Patient Name: Larry RombergKenneth R Buren Date of Encounter: 10/10/2014     Principal Problem:   Cholecystitis Active Problems:   Diabetes mellitus without complication   HTN (hypertension)   Hypotension   Anemia   CAD (coronary artery disease)   Acute cholecystitis   Dyspnea   Septic shock   Paroxysmal atrial fibrillation    SUBJECTIVE  The patient is doing well.  Heart rhythm remains stable normal sinus rhythm.  No chest pain or shortness of breath.  Tolerating Eliquis for his paroxysmal atrial fibrillation.  CURRENT MEDS . allopurinol  300 mg Oral Daily  . antiseptic oral rinse  7 mL Mouth Rinse q12n4p  . apixaban  5 mg Oral BID  . chlorhexidine  15 mL Mouth Rinse BID  . folic acid  1 mg Oral Daily  . insulin aspart  0-5 Units Subcutaneous QHS  . insulin aspart  0-9 Units Subcutaneous TID WC  . insulin glargine  40 Units Subcutaneous Daily  . multivitamin with minerals  1 tablet Oral Daily  . pantoprazole  40 mg Oral Daily  . piperacillin-tazobactam (ZOSYN)  IV  3.375 g Intravenous 3 times per day  . thiamine  100 mg Oral Daily    OBJECTIVE  Filed Vitals:   10/09/14 1155 10/09/14 1300 10/09/14 2146 10/10/14 0537  BP: 115/58 123/60 132/62 120/61  Pulse: 75 78 80 73  Temp: 97.5 F (36.4 C) 97.3 F (36.3 C) 97.9 F (36.6 C) 98 F (36.7 C)  TempSrc: Oral Oral Oral Oral  Resp: 22 18 16 16   Height:      Weight:      SpO2: 96% 96% 97% 95%    Intake/Output Summary (Last 24 hours) at 10/10/14 0910 Last data filed at 10/10/14 0616  Gross per 24 hour  Intake 1765.67 ml  Output    555 ml  Net 1210.67 ml   Filed Weights   10/05/14 1613 10/06/14 1900 10/08/14 0500  Weight: 260 lb (117.935 kg) 258 lb 6.1 oz (117.2 kg) 264 lb 1.8 oz (119.8 kg)    PHYSICAL EXAM  General: Pleasant, NAD.  Neuro: Alert and oriented X 3. Moves all extremities spontaneously. Psych: Normal affect. HEENT: Normal Neck: Supple without bruits or JVD. Lungs: Resp regular and  unlabored, CTA. Heart: RRR no s3, s4, or murmurs. Abdomen: Soft, non-tender, non-distended, bowel sounds are soft Extremities: No clubbing, cyanosis or edema. DP/PT/Radials 2+ and equal bilaterally  Accessory Clinical Findings  CBC  Recent Labs  10/09/14 0500 10/10/14 0419  WBC 8.2 7.4  HGB 9.0* 9.4*  HCT 27.3* 28.0*  MCV 93.2 94.0  PLT 274 295   Basic Metabolic Panel  Recent Labs  10/08/14 0530 10/09/14 0500 10/10/14 0419  NA 137 138 138  K 4.0 4.0 3.9  CL 102 105 109  CO2 24 27 27   GLUCOSE 203* 153* 167*  BUN 48* 38* 30*  CREATININE 2.04* 1.71* 1.59*  CALCIUM 8.0* 8.0* 8.0*  MG 2.0  --   --   PHOS 3.2  --   --    Liver Function Tests  Recent Labs  10/08/14 0530 10/10/14 0419  AST 29 28  ALT 27 26  ALKPHOS 72 60  BILITOT 1.1 1.0  PROT 5.8* 5.6*  ALBUMIN 2.4* 2.4*   No results for input(s): LIPASE, AMYLASE in the last 72 hours. Cardiac Enzymes  Recent Labs  10/07/14 1045  TROPONINI 0.04*   BNP Invalid input(s): POCBNP D-Dimer No results for input(s): DDIMER in the last 72  hours. Hemoglobin A1C No results for input(s): HGBA1C in the last 72 hours. Fasting Lipid Panel No results for input(s): CHOL, HDL, LDLCALC, TRIG, CHOLHDL, LDLDIRECT in the last 72 hours. Thyroid Function Tests No results for input(s): TSH, T4TOTAL, T3FREE, THYROIDAB in the last 72 hours.  Invalid input(s): FREET3  TELE   normal sinus rhythm  2D echo - Left ventricle: The cavity size was normal. Systolic function was normal. The estimated ejection fraction was in the range of 55% to 60%. Wall motion was normal; there were no regional wall motion abnormalities. - Mitral valve: There was mild regurgitation. - Left atrium: The atrium was mildly dilated. - Right ventricle: The cavity size was dilated. Wall thickness was normal. - Right atrium: The atrium was mildly to moderately dilated.   Radiology/Studies  Ct Abdomen Pelvis Wo Contrast  10/05/2014    CLINICAL DATA:  Upper abdominal pain since last Thursday. Nausea and vomiting.  EXAM: CT ABDOMEN AND PELVIS WITHOUT CONTRAST  TECHNIQUE: Multidetector CT imaging of the abdomen and pelvis was performed following the standard protocol without IV contrast.  COMPARISON:  10/01/2014  FINDINGS: There is bibasilar atelectasis.  There is a punctate nonobstructing left renal calculus. No obstructive uropathy. No perinephric stranding is seen. The kidneys are symmetric in size without evidence for exophytic mass. The bladder is unremarkable.  The liver demonstrates no focal abnormality. The gallbladder is distended with cholelithiasis, gallbladder wall thickening and pericholecystic inflammatory changes most consistent with acute cholecystitis. The spleen demonstrates no focal abnormality. The adrenal glands and pancreas are normal.  There is a small hiatal hernia. The unopacified stomach, duodenum, small intestine and large intestine are unremarkable, but evaluation is limited by lack of oral contrast. There is no pneumoperitoneum, pneumatosis, or portal venous gas. There is no abdominal or pelvic free fluid. There is no lymphadenopathy.  The abdominal aorta is normal in caliber with atherosclerosis.  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 There is thoracolumbar spine spondylosis most severe at L3-4 and L4-5 with disc space narrowing and bilateral severe facet arthropathy.  IMPRESSION: 1. Cholelithiasis with gallbladder distention, gallbladder wall thickening and pericholecystic inflammatory changes most consistent with acute cholecystitis.   Electronically Signed   By: Elige Ko   On: 10/05/2014 19:08   Ct Angio Chest Pe W/cm &/or Wo Cm  10/01/2014   CLINICAL DATA:  Left upper quadrant pain for 3 hours with paleness and diaphoresis.  EXAM: CT ANGIOGRAPHY CHEST, ABDOMEN AND PELVIS  TECHNIQUE: Multidetector CT imaging through the chest, abdomen and pelvis was performed using the standard protocol during bolus  administration of intravenous contrast. Multiplanar reconstructed images and MIPs were obtained and reviewed to evaluate the vascular anatomy.  CONTRAST:  OMNIPAQUE IOHEXOL 350 MG/ML SOLN  COMPARISON:  None.  FINDINGS: CTA CHEST FINDINGS  THORACIC INLET/BODY WALL:  No acute abnormality.  MEDIASTINUM:  Normal heart size. No pericardial effusion. There is coronary atherosclerosis which is diffuse throughout the left and right circulation. No aortic aneurysm, dissection, or intramural hematoma. Small sliding hiatal hernia. No evidence of pulmonary embolism.  LUNG WINDOWS:  No consolidation.  No effusion.  No suspicious pulmonary nodule.  OSSEOUS:  See below  Review of the MIP images confirms the above findings.  CTA ABDOMEN AND PELVIS FINDINGS  BODY WALL: No contributory findings.  Liver: Hepatic steatosis.  No focal abnormality.  Biliary: Cholelithiasis. No evidence of biliary obstruction or inflammation.  Pancreas: Unremarkable.  Spleen: Unremarkable.  Adrenals: Unremarkable.  Kidneys and ureters: 2 punctate calculi in  the lower pole left kidney, nonobstructive. No ureteral calculus or hydronephrosis  Bladder: Partly obscured by streak artifact from of the right hip prosthesis. No pathologic findings  Reproductive: Negative for age.  Bowel: Distal colonic diverticulosis. No bowel inflammation or obstruction. Negative appendix.  Retroperitoneum: No mass or adenopathy.  Peritoneum: No ascites or pneumoperitoneum.  Vascular: Standard aortic branching. No aneurysm, dissection, or wall thickening. There is scattered atherosclerotic calcification of the aorta and branch vessels without notable stenosis.  OSSEOUS: Right hip arthroplasty. There is near bridging bulky heterotopic ossification about the joint.  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 This is presumably posttraumatic given unilaterality.  Mid thoracic vertebral body compression deformities appear chronic. There is advanced and diffuse spondylosis.  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  Review of the MIP images confirms the above findings.  IMPRESSION: 1. No acute findings, including aortic dissection. 2. Extensive coronary atherosclerosis. 3. Hepatic steatosis. 4. Cholelithiasis. 5. Additional chronic findings are noted above.   Electronically Signed   By: Marnee Spring M.D.   On: 10/01/2014 03:54   US Abdomen Complete  10/05/2014   CLINICAL DATA:  Acute onset of right upper quadrant pain 6 days ago. Symptoms are worsening.  EXAM: ULTRASOUND ABDOMEN COMPLETE  COMPARISON:  CT abdomen same day.  FINDINGS: Gallbladder: Gallstones dependent in the gallbladder. Mild wall thickening. Positive Murphy sign. Small amount of adjacent fluid. Findings all consistent with acute cholecystitis.  Common bile duct: Diameter: 4 mm, normal  Liver: Diffuse fatty change.  No focal lesion seen.  IVC: No abnormality visualized.  Pancreas: Poorly seen because of overlying bowel gas.  Spleen: Poorly seen because of overlying bowel gas.  Right Kidney: Length: 11.0 cm. Echogenicity within normal limits. No mass or hydronephrosis visualized.  Left Kidney: Length: 10.9 cm. Echogenicity within normal limits. No mass or hydronephrosis visualized.  Abdominal aorta: No aneurysm visualized.  Other findings: None.  IMPRESSION: Consistent with acute cholecystitis. Gallstones. Wall thickening. Small amount appear cholecystic fluid. Positive Murphy sign.   Electronically Signed   By: Paulina Fusi M.D.   On: 10/05/2014 19:48   Ir Perc Cholecystostomy  10/07/2014   CLINICAL DATA:  Acute cholecystitis with sepsis  EXAM: CHOLECYSTOSTOMY  FLUOROSCOPY TIME:  48 seconds.  MEDICATIONS AND MEDICAL HISTORY: Versed 0 mg, Fentanyl 0 mcg.  Additional Medications: None  ANESTHESIA/SEDATION: None  CONTRAST:  5 cc Omnipaque 300  PROCEDURE: The procedure, risks, benefits, and alternatives were explained to the patient. Questions regarding the procedure were encouraged  and answered. The patient understands and consents to the procedure.  The right upper quadrant was prepped with Betadine in a sterile fashion, and a sterile drape was applied covering the operative field. A sterile gown and sterile gloves were used for the procedure.  A 21 gauge needle was inserted into the gallbladder lumen under sonographic guidance and via transhepatic approach. It was removed over a 018 wire, which was upsized to a 3-J. A 10-French drain was advanced over the wire and coiled in the gallbladder lumen. It was sewn to the skin after being string fixed.  FINDINGS: The cystic duct is occluded.  COMPLICATIONS: None  IMPRESSION: Successful cholecystostomy. This needs to remain in place at least 6 weeks.   Electronically Signed   By: Jolaine Click M.D.   On: 10/07/2014 08:44   Dg Chest Port 1 View  10/06/2014   CLINICAL DATA:  Central line placement.  EXAM: PORTABLE CHEST - 1 VIEW  COMPARISON:  10/06/2014  FINDINGS: Interval left IJ central venous catheter placement. The tip projects over the region of the proximal SVC however I cannot exclude extension into the azygos vein. Prominent cardiomediastinal contours. Hypoaeration with interstitial and vascular crowding. Bibasilar opacities. Small effusions not excluded. No pneumothorax. Multilevel degenerative change. Right lateral approach percutaneous cholecystostomy tube.  IMPRESSION: Left IJ catheter tip projects over the proximal SVC however extension into the azygos vein is not excluded. Consider correlation with a lateral view.  Hypoaeration with interstitial and vascular crowding. Lung base opacities, favor atelectasis.   Electronically Signed   By: Jearld Lesch M.D.   On: 10/06/2014 22:33   Dg Chest Port 1 View  10/06/2014   CLINICAL DATA:  Shortness of breath, dyspnea, post percutaneous cholecystostomy earlier today, diabetes, hypertension, coronary artery disease post MI, atrial fibrillation  EXAM: PORTABLE CHEST - 1 VIEW  COMPARISON:   Portable exam 1648 hours compared to 10/01/2014  FINDINGS: Enlargement of cardiac silhouette.  Mediastinal contours and pulmonary vascularity normal.  Mild RIGHT basilar atelectasis, new.  Remaining lungs clear.  No pleural effusion or pneumothorax.  RIGHT glenohumeral degenerative changes and probable chronic rotator cuff tear.  IMPRESSION: Mild RIGHT basilar atelectasis.   Electronically Signed   By: Ulyses Southward M.D.   On: 10/06/2014 17:10   Dg Chest Portable 1 View  10/01/2014   CLINICAL DATA:  Abdominal pain, left upper quadrant  EXAM: PORTABLE CHEST - 1 VIEW  COMPARISON:  None currently available  FINDINGS: Normal heart size and mediastinal contours for technique. No acute infiltrate or edema. No effusion or pneumothorax. No acute osseous findings.  IMPRESSION: No active disease.   Electronically Signed   By: Marnee Spring M.D.   On: 10/01/2014 03:16   Ct Angio Abd/pel W/ And/or W/o  10/01/2014   CLINICAL DATA:  Left upper quadrant pain for 3 hours with paleness and diaphoresis.  EXAM: CT ANGIOGRAPHY CHEST, ABDOMEN AND PELVIS  TECHNIQUE: Multidetector CT imaging through the chest, abdomen and pelvis was performed using the standard protocol during bolus administration of intravenous contrast. Multiplanar reconstructed images and MIPs were obtained and reviewed to evaluate the vascular anatomy.  CONTRAST:  OMNIPAQUE IOHEXOL 350 MG/ML SOLN  COMPARISON:  None.  FINDINGS: CTA CHEST FINDINGS  THORACIC INLET/BODY WALL:  No acute abnormality.  MEDIASTINUM:  Normal heart size. No pericardial effusion. There is coronary atherosclerosis which is diffuse throughout the left and right circulation. No aortic aneurysm, dissection, or intramural hematoma. Small sliding hiatal hernia. No evidence of pulmonary embolism.  LUNG WINDOWS:  No consolidation.  No effusion.  No suspicious pulmonary nodule.  OSSEOUS:  See below  Review of the MIP images confirms the above findings.  CTA ABDOMEN AND PELVIS FINDINGS  BODY  WALL: No contributory findings.  Liver: Hepatic steatosis.  No focal abnormality.  Biliary: Cholelithiasis. No evidence of biliary obstruction or inflammation.  Pancreas: Unremarkable.  Spleen: Unremarkable.  Adrenals: Unremarkable.  Kidneys and ureters: 2 punctate calculi in the lower pole left kidney, nonobstructive. No ureteral calculus or hydronephrosis  Bladder: Partly obscured by streak artifact from of the right hip prosthesis. No pathologic findings  Reproductive: Negative for age.  Bowel: Distal colonic diverticulosis. No bowel inflammation or obstruction. Negative appendix.  Retroperitoneum: No mass or adenopathy.  Peritoneum: No ascites or pneumoperitoneum.  Vascular: Standard aortic branching. No aneurysm, dissection, or wall thickening. There is scattered atherosclerotic calcification of the aorta and branch vessels without notable stenosis.  OSSEOUS: Right hip arthroplasty. There is near bridging bulky heterotopic ossification  about the joint.  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 This is presumably posttraumatic given unilaterality.  Mid thoracic vertebral body compression deformities appear chronic. There is advanced and diffuse spondylosis. 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  Review of the MIP images confirms the above findings.  IMPRESSION: 1. No acute findings, including aortic dissection. 2. Extensive coronary atherosclerosis. 3. Hepatic steatosis. 4. Cholelithiasis. 5. Additional chronic findings are noted above.   Electronically Signed   By: Marnee Spring M.D.   On: 10/01/2014 03:54    ASSESSMENT AND PLAN  1. Acute cholecystitis 2. Ischemic heart disease.  No chest pain.  Normal LV function by 2-D echo. 3. PAF maintaining NSR. Continue Eliquis. This patients CHA2DS2-VASc Score is 4 4. HTN - stable 5. 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  6. DM 7. Dyslipidemia .  Restart statin after  acute illness is over.  Plan: Continue Eliquis.  We will sign off now.  Please call for questions.  He will follow-up with his cardiologist in [facility] after discharge.  Signed, Cassell Clement MD

## 2014-10-11 LAB — CBC
HEMATOCRIT: 28.8 % — AB (ref 39.0–52.0)
Hemoglobin: 9.4 g/dL — ABNORMAL LOW (ref 13.0–17.0)
MCH: 30.7 pg (ref 26.0–34.0)
MCHC: 32.6 g/dL (ref 30.0–36.0)
MCV: 94.1 fL (ref 78.0–100.0)
PLATELETS: 309 10*3/uL (ref 150–400)
RBC: 3.06 MIL/uL — ABNORMAL LOW (ref 4.22–5.81)
RDW: 16.3 % — AB (ref 11.5–15.5)
WBC: 9.2 10*3/uL (ref 4.0–10.5)

## 2014-10-11 LAB — BASIC METABOLIC PANEL
ANION GAP: 6 (ref 5–15)
BUN: 22 mg/dL (ref 6–23)
CHLORIDE: 109 mmol/L (ref 96–112)
CO2: 27 mmol/L (ref 19–32)
CREATININE: 1.56 mg/dL — AB (ref 0.50–1.35)
Calcium: 8.3 mg/dL — ABNORMAL LOW (ref 8.4–10.5)
GFR calc Af Amer: 51 mL/min — ABNORMAL LOW (ref >90)
GFR, EST NON AFRICAN AMERICAN: 44 mL/min — AB (ref >90)
Glucose, Bld: 104 mg/dL — ABNORMAL HIGH (ref 70–99)
Potassium: 3.8 mmol/L (ref 3.5–5.1)
Sodium: 142 mmol/L (ref 135–145)

## 2014-10-11 LAB — GLUCOSE, CAPILLARY
GLUCOSE-CAPILLARY: 120 mg/dL — AB (ref 70–99)
Glucose-Capillary: 165 mg/dL — ABNORMAL HIGH (ref 70–99)

## 2014-10-11 MED ORDER — AMOXICILLIN-POT CLAVULANATE 875-125 MG PO TABS: 1.0000 | ORAL_TABLET | Freq: Two times a day (BID) | ORAL | Status: DC

## 2014-10-11 MED ORDER — OXYCODONE HCL 5 MG PO TABS: 5.0000 mg | ORAL_TABLET | Freq: Four times a day (QID) | ORAL | Status: DC | PRN

## 2014-10-11 NOTE — Progress Notes (Signed)
Completed D/C teaching with patient and family. Educated on drain and pt and wife demonstrated how to drain and flush. Answered all questions and pt will be leaving with family.

## 2014-10-11 NOTE — Discharge Summary (Signed)
Physician Discharge Summary  Larry [name] ZOX:[id number] DOB: December 31, 1945 DOA: 10/05/2014  PCP: Pcp Not In System  Admit date: 10/05/2014 Discharge date: 10/11/2014  Time spent: 35 minutes  Recommendations for Outpatient Follow-up:  1. Patient discharged with a percutaneous cholecystostomy drain, will follow-up with general surgery. 2. BMP and CBC in 1-2 weeks  Discharge Diagnoses:  Principal Problem:   Cholecystitis ` Active Problems:   Diabetes mellitus without complication   HTN (hypertension)   Hypotension   Anemia   CAD (coronary artery disease)   Acute cholecystitis   Dyspnea   Septic shock   Paroxysmal atrial fibrillation   Discharge Condition: Stable  Diet recommendation: Heart Healthy  Filed Weights   10/05/14 1613 10/06/14 1900 10/08/14 0500  Weight: 117.935 kg (260 lb) 117.2 kg (258 lb 6.1 oz) 119.8 kg (264 lb 1.8 oz)    History of present illness:  Larry [name] is a 69 y.o. male presents with abdominal pain. Patient states that he went to the [place] recently and his medication was changed around. Patient states mostly this was his diabetes medications. He states he started throwing up around Thursday last week. He states that vomiting was intractable. He states that he though his abdominal pain was related to his throwing up. He states that the pain is located on the RUQ. He states that it seems to hurt when he pushes on that side. He has had no diarrhea noted. He states that he has had no cough or congestion. He states he has had no fever that he is awre of. Patient states that he has felt generally weak. In the ED he had an ultrasound done and this shows presence of cholecystitis and also has some gallstones.   Hospital Course:  Patient is a pleasant 69 year old gentleman with a past medical history of hypertension and diabetes who was admitted to the medicine service on 10/05/2014 when he presented with complaints of abdominal pain. He was worked up with a CT scan  of abdomen and pelvis that showed cholelithiasis with gallbladder distention, gallbladder wall thickening and a cholecystic inflammation changes consistent with acute cholecystitis. Started on empiric IV antibiotic therapy with Zosyn. Gen. surgery and cardiology were consulted. From a cardiac standpoint he has a history of paroxysmal atrial fibrillation who would been anticoagulated with Eliquis as an outpatient. Patient was evaluated by Dr. [name] of general surgery who recommended placement of percutaneous cholecystostomy. Interventional radiology was consulted as percutaneous cholecystostomy was performed on 10/06/2014. Patient tolerated procedure well there no immediate complications. Patient showed gradual improvement as his diet was advanced. Cardiology restarted Eliquis. By 10/11/2014 he was afebrile, tolerating by mouth intake, with percutaneous cholecystostomy draining well. Gen. surgery felt he was stable from a surgical standpoint for discharge. She was discharged to his home on 10/11/2014.  Procedures:  Percutaneous cholecystostomy tube placement on 10/06/2014  Consultations:  General surgery  Cardiology  Interventional radiology  Discharge Exam: Filed Vitals:   10/11/14 0533  BP: 125/55  Pulse: 66  Temp: 97.7 F (36.5 C)  Resp: 17    General: Patient is sitting at side of bed, he reports feeling much better, states feeling well enough to go home today. Denies nausea, vomiting, abdominal pain. Cardiovascular: Regular rate and rhythm normal S1-S2 no murmurs rubs or gallops Respiratory: Clear to auscultation bilaterally Abdomen: Soft nontender nondistended, obese, percutaneous tube in place  Discharge Instructions   Discharge Instructions    Call MD for:  difficulty breathing, headache or visual disturbances  Complete by:  As directed      Call MD for:  extreme fatigue    Complete by:  As directed      Call MD for:  hives    Complete by:  As directed      Call MD  for:  persistant dizziness or light-headedness    Complete by:  As directed      Call MD for:  persistant nausea and vomiting    Complete by:  As directed      Call MD for:  redness, tenderness, or signs of infection (pain, swelling, redness, odor or green/yellow discharge around incision site)    Complete by:  As directed      Call MD for:  severe uncontrolled pain    Complete by:  As directed      Call MD for:  temperature >100.4    Complete by:  As directed      Diet - low sodium heart healthy    Complete by:  As directed      Discharge instructions    Complete by:  As directed   Please follow up with your Family Physician in 1-2 weeks Check your blood sugars three times daily before meals, record on log book and report them to your family physician.     Increase activity slowly    Complete by:  As directed           Current Discharge Medication List    START taking these medications   Details  amoxicillin-clavulanate (AUGMENTIN) 875-125 MG per tablet Take 1 tablet by mouth 2 (two) times daily. Qty: 8 tablet, Refills: 0    oxyCODONE (OXY IR/ROXICODONE) 5 MG immediate release tablet Take 1 tablet (5 mg total) by mouth every 6 (six) hours as needed for moderate pain. Qty: 20 tablet, Refills: 0      CONTINUE these medications which have NOT CHANGED   Details  acetaminophen-codeine (TYLENOL #3) 300-30 MG per tablet Take 1 tablet by mouth 2 (two) times daily.    allopurinol (ZYLOPRIM) 300 MG tablet Take 300 mg by mouth daily.    amLODipine (NORVASC) 10 MG tablet Take 10 mg by mouth daily.    apixaban (ELIQUIS) 5 MG TABS tablet Take 1 tablet (5 mg total) by mouth 2 (two) times daily. Qty: 60 tablet, Refills: 0    atorvastatin (LIPITOR) 80 MG tablet Take 80 mg by mouth daily.    Cholecalciferol (VITAMIN D PO) Take 1 tablet by mouth daily.     citalopram (CELEXA) 40 MG tablet Take 40 mg by mouth daily.    Cyanocobalamin (VITAMIN B 12 PO) Take 1 tablet by mouth daily.      furosemide (LASIX) 40 MG tablet Take 40 mg by mouth daily.     insulin glargine (LANTUS) 100 UNIT/ML injection Inject 40 Units into the skin at bedtime.    magnesium oxide (MAG-OX) 400 MG tablet Take 400 mg by mouth daily.    pantoprazole (PROTONIX) 40 MG tablet Take 40 mg by mouth daily.    sucralfate (CARAFATE) 1 GM/10ML suspension Take 10 mLs (1 g total) by mouth 4 (four) times daily -  with meals and at bedtime. Qty: 420 mL, Refills: 0      STOP taking these medications     aspirin 81 MG chewable tablet      Liraglutide 18 MG/3ML SOPN      lisinopril (PRINIVIL,ZESTRIL) 40 MG tablet      metFORMIN (GLUCOPHAGE) 500 MG tablet  No Known Allergies Follow-up Information    Follow up with Atilano InaWILSON,ERIC M, MD. Schedule an appointment as soon as possible for a visit in 4 weeks.   Specialty:  General Surgery   Contact information:   [address] [place] [id number] [phone]        The results of significant diagnostics from this hospitalization (including imaging, microbiology, ancillary and laboratory) are listed below for reference.    Significant Diagnostic Studies: Ct Abdomen Pelvis Wo Contrast  10/05/2014   CLINICAL DATA:  Upper abdominal pain since last Thursday. Nausea and vomiting.  EXAM: CT ABDOMEN AND PELVIS WITHOUT CONTRAST  TECHNIQUE: Multidetector CT imaging of the abdomen and pelvis was performed following the standard protocol without IV contrast.  COMPARISON:  10/01/2014  FINDINGS: There is bibasilar atelectasis.  There is a punctate nonobstructing left renal calculus. No obstructive uropathy. No perinephric stranding is seen. The kidneys are symmetric in size without evidence for exophytic mass. The bladder is unremarkable.  The liver demonstrates no focal abnormality. The gallbladder is distended with cholelithiasis, gallbladder wall thickening and pericholecystic inflammatory changes most consistent with acute cholecystitis. The spleen  demonstrates no focal abnormality. The adrenal glands and pancreas are normal.  There is a small hiatal hernia. The unopacified stomach, duodenum, small intestine and large intestine are unremarkable, but evaluation is limited by lack of oral contrast. There is no pneumoperitoneum, pneumatosis, or portal venous gas. There is no abdominal or pelvic free fluid. There is no lymphadenopathy.  The abdominal aorta is normal in caliber with atherosclerosis.  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 There is thoracolumbar spine spondylosis most severe at L3-4 and L4-5 with disc space narrowing and bilateral severe facet arthropathy.  IMPRESSION: 1. Cholelithiasis with gallbladder distention, gallbladder wall thickening and pericholecystic inflammatory changes most consistent with acute cholecystitis.   Electronically Signed   By: Elige KoHetal  Patel   On: 10/05/2014 19:08   Ct Angio Chest Pe W/cm &/or Wo Cm  10/01/2014   CLINICAL DATA:  Left upper quadrant pain for 3 hours with paleness and diaphoresis.  EXAM: CT ANGIOGRAPHY CHEST, ABDOMEN AND PELVIS  TECHNIQUE: Multidetector CT imaging through the chest, abdomen and pelvis was performed using the standard protocol during bolus administration of intravenous contrast. Multiplanar reconstructed images and MIPs were obtained and reviewed to evaluate the vascular anatomy.  CONTRAST:  100mL OMNIPAQUE IOHEXOL 350 MG/ML SOLN  COMPARISON:  None.  FINDINGS: CTA CHEST FINDINGS  THORACIC INLET/BODY WALL:  No acute abnormality.  MEDIASTINUM:  Normal heart size. No pericardial effusion. There is coronary atherosclerosis which is diffuse throughout the left and right circulation. No aortic aneurysm, dissection, or intramural hematoma. Small sliding hiatal hernia. No evidence of pulmonary embolism.  LUNG WINDOWS:  No consolidation.  No effusion.  No suspicious pulmonary nodule.  OSSEOUS:  See below  Review of the MIP images confirms the above findings.  CTA ABDOMEN AND PELVIS FINDINGS  BODY  WALL: No contributory findings.  Liver: Hepatic steatosis.  No focal abnormality.  Biliary: Cholelithiasis. No evidence of biliary obstruction or inflammation.  Pancreas: Unremarkable.  Spleen: Unremarkable.  Adrenals: Unremarkable.  Kidneys and ureters: 2 punctate calculi in the lower pole left kidney, nonobstructive. No ureteral calculus or hydronephrosis  Bladder: Partly obscured by streak artifact from of the right hip prosthesis. No pathologic findings  Reproductive: Negative for age.  Bowel: Distal colonic diverticulosis. No bowel inflammation or obstruction. Negative appendix.  Retroperitoneum: No mass or adenopathy.  Peritoneum: No ascites or pneumoperitoneum.  Vascular: Standard aortic branching. No aneurysm, dissection, or wall thickening. There is scattered atherosclerotic calcification of the aorta and branch vessels without notable stenosis.  OSSEOUS: Right hip arthroplasty. There is near bridging bulky heterotopic ossification about the joint.  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 This is presumably posttraumatic given unilaterality.  Mid thoracic vertebral body compression deformities appear chronic. There is advanced and diffuse spondylosis. 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  Review of the MIP images confirms the above findings.  IMPRESSION: 1. No acute findings, including aortic dissection. 2. Extensive coronary atherosclerosis. 3. Hepatic steatosis. 4. Cholelithiasis. 5. Additional chronic findings are noted above.   Electronically Signed   By: Marnee Spring M.D.   On: 10/01/2014 03:54   US Abdomen Complete  10/05/2014   CLINICAL DATA:  Acute onset of right upper quadrant pain 6 days ago. Symptoms are worsening.  EXAM: ULTRASOUND ABDOMEN COMPLETE  COMPARISON:  CT abdomen same day.  FINDINGS: Gallbladder: Gallstones dependent in the gallbladder. Mild wall thickening. Positive Murphy sign. Small amount of adjacent fluid. Findings all  consistent with acute cholecystitis.  Common bile duct: Diameter: 4 mm, normal  Liver: Diffuse fatty change.  No focal lesion seen.  IVC: No abnormality visualized.  Pancreas: Poorly seen because of overlying bowel gas.  Spleen: Poorly seen because of overlying bowel gas.  Right Kidney: Length: 11.0 cm. Echogenicity within normal limits. No mass or hydronephrosis visualized.  Left Kidney: Length: 10.9 cm. Echogenicity within normal limits. No mass or hydronephrosis visualized.  Abdominal aorta: No aneurysm visualized.  Other findings: None.  IMPRESSION: Consistent with acute cholecystitis. Gallstones. Wall thickening. Small amount appear cholecystic fluid. Positive Murphy sign.   Electronically Signed   By: Paulina Fusi M.D.   On: 10/05/2014 19:48   Ir Perc Cholecystostomy  10/07/2014   CLINICAL DATA:  Acute cholecystitis with sepsis  EXAM: CHOLECYSTOSTOMY  FLUOROSCOPY TIME:  48 seconds.  MEDICATIONS AND MEDICAL HISTORY: Versed 0 mg, Fentanyl 0 mcg.  Additional Medications: None  ANESTHESIA/SEDATION: None  CONTRAST:  5 cc Omnipaque 300  PROCEDURE: The procedure, risks, benefits, and alternatives were explained to the patient. Questions regarding the procedure were encouraged and answered. The patient understands and consents to the procedure.  The right upper quadrant was prepped with Betadine in a sterile fashion, and a sterile drape was applied covering the operative field. A sterile gown and sterile gloves were used for the procedure.  A 21 gauge needle was inserted into the gallbladder lumen under sonographic guidance and via transhepatic approach. It was removed over a 018 wire, which was upsized to a 3-J. A 10-French drain was advanced over the wire and coiled in the gallbladder lumen. It was sewn to the skin after being string fixed.  FINDINGS: The cystic duct is occluded.  COMPLICATIONS: None  IMPRESSION: Successful cholecystostomy. This needs to remain in place at least 6 weeks.   Electronically Signed    By: Jolaine Click M.D.   On: 10/07/2014 08:44   Dg Chest Port 1 View  10/06/2014   CLINICAL DATA:  Central line placement.  EXAM: PORTABLE CHEST - 1 VIEW  COMPARISON:  10/06/2014  FINDINGS: Interval left IJ central venous catheter placement. The tip projects over the region of the proximal SVC however I cannot exclude extension into the azygos vein. Prominent cardiomediastinal contours. Hypoaeration with interstitial and vascular crowding. Bibasilar opacities. Small effusions not excluded. No pneumothorax. Multilevel degenerative change. Right lateral approach percutaneous cholecystostomy tube.  IMPRESSION: Left IJ  catheter tip projects over the proximal SVC however extension into the azygos vein is not excluded. Consider correlation with a lateral view.  Hypoaeration with interstitial and vascular crowding. Lung base opacities, favor atelectasis.   Electronically Signed   By: Jearld Lesch M.D.   On: 10/06/2014 22:33   Dg Chest Port 1 View  10/06/2014   CLINICAL DATA:  Shortness of breath, dyspnea, post percutaneous cholecystostomy earlier today, diabetes, hypertension, coronary artery disease post MI, atrial fibrillation  EXAM: PORTABLE CHEST - 1 VIEW  COMPARISON:  Portable exam 1648 hours compared to 10/01/2014  FINDINGS: Enlargement of cardiac silhouette.  Mediastinal contours and pulmonary vascularity normal.  Mild RIGHT basilar atelectasis, new.  Remaining lungs clear.  No pleural effusion or pneumothorax.  RIGHT glenohumeral degenerative changes and probable chronic rotator cuff tear.  IMPRESSION: Mild RIGHT basilar atelectasis.   Electronically Signed   By: Ulyses Southward M.D.   On: 10/06/2014 17:10   Dg Chest Portable 1 View  10/01/2014   CLINICAL DATA:  Abdominal pain, left upper quadrant  EXAM: PORTABLE CHEST - 1 VIEW  COMPARISON:  None currently available  FINDINGS: Normal heart size and mediastinal contours for technique. No acute infiltrate or edema. No effusion or pneumothorax. No acute  osseous findings.  IMPRESSION: No active disease.   Electronically Signed   By: Marnee Spring M.D.   On: 10/01/2014 03:16   Ct Angio Abd/pel W/ And/or W/o  10/01/2014   CLINICAL DATA:  Left upper quadrant pain for 3 hours with paleness and diaphoresis.  EXAM: CT ANGIOGRAPHY CHEST, ABDOMEN AND PELVIS  TECHNIQUE: Multidetector CT imaging through the chest, abdomen and pelvis was performed using the standard protocol during bolus administration of intravenous contrast. Multiplanar reconstructed images and MIPs were obtained and reviewed to evaluate the vascular anatomy.  CONTRAST:  OMNIPAQUE IOHEXOL 350 MG/ML SOLN  COMPARISON:  None.  FINDINGS: CTA CHEST FINDINGS  THORACIC INLET/BODY WALL:  No acute abnormality.  MEDIASTINUM:  Normal heart size. No pericardial effusion. There is coronary atherosclerosis which is diffuse throughout the left and right circulation. No aortic aneurysm, dissection, or intramural hematoma. Small sliding hiatal hernia. No evidence of pulmonary embolism.  LUNG WINDOWS:  No consolidation.  No effusion.  No suspicious pulmonary nodule.  OSSEOUS:  See below  Review of the MIP images confirms the above findings.  CTA ABDOMEN AND PELVIS FINDINGS  BODY WALL: No contributory findings.  Liver: Hepatic steatosis.  No focal abnormality.  Biliary: Cholelithiasis. No evidence of biliary obstruction or inflammation.  Pancreas: Unremarkable.  Spleen: Unremarkable.  Adrenals: Unremarkable.  Kidneys and ureters: 2 punctate calculi in the lower pole left kidney, nonobstructive. No ureteral calculus or hydronephrosis  Bladder: Partly obscured by streak artifact from of the right hip prosthesis. No pathologic findings  Reproductive: Negative for age.  Bowel: Distal colonic diverticulosis. No bowel inflammation or obstruction. Negative appendix.  Retroperitoneum: No mass or adenopathy.  Peritoneum: No ascites or pneumoperitoneum.  Vascular: Standard aortic branching. No aneurysm, dissection, or wall  thickening. There is scattered atherosclerotic calcification of the aorta and branch vessels without notable stenosis.  OSSEOUS: Right hip arthroplasty. There is near bridging bulky heterotopic ossification about the joint.  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 This is presumably posttraumatic given unilaterality.  Mid thoracic vertebral body compression deformities appear chronic. There is advanced and diffuse spondylosis. 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  Review of the MIP images confirms the above findings.  IMPRESSION: 1. No acute findings,  including aortic dissection. 2. Extensive coronary atherosclerosis. 3. Hepatic steatosis. 4. Cholelithiasis. 5. Additional chronic findings are noted above.   Electronically Signed   By: Marnee Spring M.D.   On: 10/01/2014 03:54    Microbiology: Recent Results (from the past 240 hour(s))  Surgical pcr screen     Status: None   Collection Time: 10/06/14  8:06 AM  Result Value Ref Range Status   MRSA, PCR NEGATIVE NEGATIVE Final   Staphylococcus aureus NEGATIVE NEGATIVE Final    Comment:        The Xpert SA Assay (FDA approved for NASAL specimens in patients over 45 years of age), is one component of a comprehensive surveillance program.  Test performance has been validated by [facility] for patients greater than or equal to 71 year old. It is not intended to diagnose infection nor to guide or monitor treatment.   Body fluid culture     Status: None   Collection Time: 10/06/14  4:15 PM  Result Value Ref Range Status   Specimen Description BILE  Final   Special Requests NONE  Final   Gram Stain   Final    NO WBC SEEN FEW GRAM NEGATIVE RODS Gram Stain Report Called to,Read Back By and Verified With: Gram Stain Report Called to,Read Back By and Verified With: RUBY JOHNSON RN 10/07/14 8:40AM BY MILSH Performed at [facility]    Culture   Final    MODERATE ESCHERICHIA COLI Culture  reincubated for better growth Performed at [facility]    Report Status 10/10/2014 FINAL  Final     Labs: Basic Metabolic Panel:  Recent Labs Lab 10/07/14 1900 10/08/14 0530 10/09/14 0500 10/10/14 0419 10/11/14 0432  NA 134* 137 138 138 142  K 3.5 4.0 4.0 3.9 3.8  CL 101 102 105 109 109  CO2 24 24 27 27 27   GLUCOSE 213* 203* 153* 167* 104*  BUN 52* 48* 38* 30* 22  CREATININE 2.42* 2.04* 1.71* 1.59* 1.56*  CALCIUM 7.7* 8.0* 8.0* 8.0* 8.3*  MG  --  2.0  --   --   --   PHOS  --  3.2  --   --   --    Liver Function Tests:  Recent Labs Lab 10/05/14 1640 10/06/14 0445 10/07/14 0558 10/08/14 0530 10/10/14 0419  AST 34 34 50* 29 28  ALT 25 26 35 27 26  ALKPHOS 76 74 87 72 60  BILITOT 2.8* 2.5* 2.4* 1.1 1.0  PROT 6.6 6.0 6.3 5.8* 5.6*  ALBUMIN 3.0* 2.7* 2.6* 2.4* 2.4*    Recent Labs Lab 10/05/14 1640  LIPASE 25   No results for input(s): AMMONIA in the last 168 hours. CBC:  Recent Labs Lab 10/05/14 1640  10/07/14 0558 10/08/14 0530 10/09/14 0500 10/10/14 0419 10/11/14 0432  WBC 11.7*  < > 16.9* 11.7* 8.2 7.4 9.2  NEUTROABS 9.7*  --   --   --   --   --   --   HGB 10.6*  < > 10.3* 9.4* 9.0* 9.4* 9.4*  HCT 30.3*  < > 30.4* 27.3* 27.3* 28.0* 28.8*  MCV 89.1  < > 92.7 92.2 93.2 94.0 94.1  PLT 174  < > 228 254 274 295 309  < > = values in this interval not displayed. Cardiac Enzymes:  Recent Labs Lab 10/06/14 1957 10/07/14 0029 10/07/14 1045  TROPONINI 0.05* 0.05* 0.04*   BNP: BNP (last 3 results) No results for input(s): BNP in the last  8760 hours.  ProBNP (last 3 results) No results for input(s): PROBNP in the last 8760 hours.  CBG:  Recent Labs Lab 10/10/14 0737 10/10/14 1133 10/10/14 1643 10/10/14 2123 10/11/14 0723  GLUCAP 157* 144* 158* 114* 120*       Signed:  Charnell Peplinski  [facility] 10/11/2014, 11:14 AM

## 2014-10-11 NOTE — Progress Notes (Signed)
Patient ID: Larry [name], male   DOB: 01/11/1946, 69 y.o.   MRN: [id number]     [facility]      [address] [place], [place] [address]    Phone: [phone] FAX: [phone]     Subjective: No pain.  Tolerating POs.  VSS.  Afebrile.   Objective:  Vital signs:  Filed Vitals:   10/10/14 0537 10/10/14 1336 10/10/14 2126 10/11/14 0533  BP: 120/61 116/65 126/60 125/55  Pulse: 73 78 80 66  Temp: 98 F (36.7 C) 97.9 F (36.6 C) 98.4 F (36.9 C) 97.7 F (36.5 C)  TempSrc: Oral Oral Oral Oral  Resp: $Remo'16 16 18 17  'gZtpb$ Height:      Weight:      SpO2: 95% 97% 96% 98%    Last BM Date: 10/10/14  Intake/Output   Yesterday:  04/24 0701 - 04/25 0700 In: 1357.3 [P.O.:1020; I.V.:167.3; IV Piggyback:150] Out: 254 [Urine:400; Drains:475] This shift:    I/O last 3 completed shifts: In: 2827.3 [P.O.:2220; I.V.:287.3; Other:20; IV YHCWCBJSE:831] Out: 1430 [Urine:600; Drains:830]    Physical Exam: General: Pt awake/alert/oriented x4 in no acute distress Abdomen: Soft.  Nondistended. Non tender.  RUQ with bilious output.   No evidence of peritonitis.  No incarcerated hernias.    Problem List:   Principal Problem:   Cholecystitis Active Problems:   Diabetes mellitus without complication   HTN (hypertension)   Hypotension   Anemia   CAD (coronary artery disease)   Acute cholecystitis   Dyspnea   Septic shock   Paroxysmal atrial fibrillation    Results:   Labs: Results for orders placed or performed during the hospital encounter of 10/05/14 (from the past 48 hour(s))  Glucose, capillary     Status: Abnormal   Collection Time: 10/09/14 12:02 PM  Result Value Ref Range   Glucose-Capillary 146 (H) 70 - 99 mg/dL  Occult blood card to lab, stool     Status: None   Collection Time: 10/09/14  5:08 PM  Result Value Ref Range   Fecal Occult Bld NEGATIVE NEGATIVE  Glucose, capillary     Status: Abnormal   Collection Time:  10/09/14  5:50 PM  Result Value Ref Range   Glucose-Capillary 136 (H) 70 - 99 mg/dL  Glucose, capillary     Status: Abnormal   Collection Time: 10/09/14  9:45 PM  Result Value Ref Range   Glucose-Capillary 158 (H) 70 - 99 mg/dL  CBC     Status: Abnormal   Collection Time: 10/10/14  4:19 AM  Result Value Ref Range   WBC 7.4 4.0 - 10.5 K/uL   RBC 2.98 (L) 4.22 - 5.81 MIL/uL   Hemoglobin 9.4 (L) 13.0 - 17.0 g/dL   HCT 28.0 (L) 39.0 - 52.0 %   MCV 94.0 78.0 - 100.0 fL   MCH 31.5 26.0 - 34.0 pg   MCHC 33.6 30.0 - 36.0 g/dL   RDW 16.3 (H) 11.5 - 15.5 %   Platelets 295 150 - 400 K/uL  Comprehensive metabolic panel     Status: Abnormal   Collection Time: 10/10/14  4:19 AM  Result Value Ref Range   Sodium 138 135 - 145 mmol/L   Potassium 3.9 3.5 - 5.1 mmol/L   Chloride 109 96 - 112 mmol/L   CO2 27 19 - 32 mmol/L   Glucose, Bld 167 (H) 70 - 99 mg/dL   BUN 30 (H) 6 - 23 mg/dL   Creatinine, Ser  1.59 (H) 0.50 - 1.35 mg/dL   Calcium 8.0 (L) 8.4 - 10.5 mg/dL   Total Protein 5.6 (L) 6.0 - 8.3 g/dL   Albumin 2.4 (L) 3.5 - 5.2 g/dL   AST 28 0 - 37 U/L   ALT 26 0 - 53 U/L   Alkaline Phosphatase 60 39 - 117 U/L   Total Bilirubin 1.0 0.3 - 1.2 mg/dL   GFR calc non Af Amer 43 (L) >90 mL/min   GFR calc Af Amer 50 (L) >90 mL/min    Comment: (NOTE) The eGFR has been calculated using the CKD EPI equation. This calculation has not been validated in all clinical situations. eGFR's persistently <90 mL/min signify possible Chronic Kidney Disease.    Anion gap 2 (L) 5 - 15  Glucose, capillary     Status: Abnormal   Collection Time: 10/10/14  7:37 AM  Result Value Ref Range   Glucose-Capillary 157 (H) 70 - 99 mg/dL  Glucose, capillary     Status: Abnormal   Collection Time: 10/10/14 11:33 AM  Result Value Ref Range   Glucose-Capillary 144 (H) 70 - 99 mg/dL  Glucose, capillary     Status: Abnormal   Collection Time: 10/10/14  4:43 PM  Result Value Ref Range   Glucose-Capillary 158 (H) 70 - 99  mg/dL  Glucose, capillary     Status: Abnormal   Collection Time: 10/10/14  9:23 PM  Result Value Ref Range   Glucose-Capillary 114 (H) 70 - 99 mg/dL  CBC     Status: Abnormal   Collection Time: 10/11/14  4:32 AM  Result Value Ref Range   WBC 9.2 4.0 - 10.5 K/uL   RBC 3.06 (L) 4.22 - 5.81 MIL/uL   Hemoglobin 9.4 (L) 13.0 - 17.0 g/dL   HCT 28.8 (L) 39.0 - 52.0 %   MCV 94.1 78.0 - 100.0 fL   MCH 30.7 26.0 - 34.0 pg   MCHC 32.6 30.0 - 36.0 g/dL   RDW 16.3 (H) 11.5 - 15.5 %   Platelets 309 150 - 400 K/uL  Basic metabolic panel     Status: Abnormal   Collection Time: 10/11/14  4:32 AM  Result Value Ref Range   Sodium 142 135 - 145 mmol/L   Potassium 3.8 3.5 - 5.1 mmol/L   Chloride 109 96 - 112 mmol/L   CO2 27 19 - 32 mmol/L   Glucose, Bld 104 (H) 70 - 99 mg/dL   BUN 22 6 - 23 mg/dL   Creatinine, Ser 1.56 (H) 0.50 - 1.35 mg/dL   Calcium 8.3 (L) 8.4 - 10.5 mg/dL   GFR calc non Af Amer 44 (L) >90 mL/min   GFR calc Af Amer 51 (L) >90 mL/min    Comment: (NOTE) The eGFR has been calculated using the CKD EPI equation. This calculation has not been validated in all clinical situations. eGFR's persistently <90 mL/min signify possible Chronic Kidney Disease.    Anion gap 6 5 - 15  Glucose, capillary     Status: Abnormal   Collection Time: 10/11/14  7:23 AM  Result Value Ref Range   Glucose-Capillary 120 (H) 70 - 99 mg/dL    Imaging / Studies: No results found.  Medications / Allergies:  Scheduled Meds: . allopurinol  300 mg Oral Daily  . antiseptic oral rinse  7 mL Mouth Rinse q12n4p  . apixaban  5 mg Oral BID  . chlorhexidine  15 mL Mouth Rinse BID  . folic acid  1 mg Oral  Daily  . insulin aspart  0-5 Units Subcutaneous QHS  . insulin aspart  0-9 Units Subcutaneous TID WC  . insulin glargine  40 Units Subcutaneous Daily  . multivitamin with minerals  1 tablet Oral Daily  . pantoprazole  40 mg Oral Daily  . piperacillin-tazobactam (ZOSYN)  IV  3.375 g Intravenous 3 times  per day  . thiamine  100 mg Oral Daily   Continuous Infusions: . sodium chloride 10 mL/hr at 10/06/14 2322   PRN Meds:.acetaminophen **OR** acetaminophen, dextrose, HYDROmorphone (DILAUDID) injection, ondansetron **OR** ondansetron (ZOFRAN) IV, oxyCODONE  Antibiotics: Anti-infectives    Start     Dose/Rate Route Frequency Ordered Stop   10/06/14 0130  piperacillin-tazobactam (ZOSYN) IVPB 3.375 g     3.375 g 12.5 mL/hr over 240 Minutes Intravenous 3 times per day 10/06/14 0128     10/05/14 2100  piperacillin-tazobactam (ZOSYN) IVPB 3.375 g     3.375 g 100 mL/hr over 30 Minutes Intravenous  Once 10/05/14 2049 10/05/14 2143        Assessment/Plan Cholecystitis  S/p perc chole drain -tolerating POs, no pain, VSS, afebrile.  GNR on culture.  ZOsyn x5 days.  May change to PO to complete a 7 day course -drain care teaching -follow up with Dr. Redmond Pulling in 4 weeks for possible cholecystectomy in 6-8 weeks  -stable for DC from surgical standpoint  Erby Pian, [facility] Pager [phone](7A-4:30P)   10/11/2014 10:25 AM

## 2014-10-24 ENCOUNTER — Emergency Department (HOSPITAL_COMMUNITY)
Admission: EM | Admit: 2014-10-24 | Discharge: 2014-10-24 | Disposition: A | Discharge status: Home / Self Care | Payer: Medicare Other | Attending: Emergency Medicine | Admitting: Emergency Medicine

## 2014-10-24 ENCOUNTER — Encounter (HOSPITAL_COMMUNITY): Payer: Self-pay | Admitting: Emergency Medicine

## 2014-10-24 DIAGNOSIS — E669 Obesity, unspecified: Secondary | ICD-10-CM | POA: Insufficient documentation

## 2014-10-24 DIAGNOSIS — I4891 Unspecified atrial fibrillation: Secondary | ICD-10-CM | POA: Insufficient documentation

## 2014-10-24 DIAGNOSIS — Y838 Other surgical procedures as the cause of abnormal reaction of the patient, or of later complication, without mention of misadventure at the time of the procedure: Secondary | ICD-10-CM | POA: Insufficient documentation

## 2014-10-24 DIAGNOSIS — I2510 Atherosclerotic heart disease of native coronary artery without angina pectoris: Secondary | ICD-10-CM | POA: Diagnosis not present

## 2014-10-24 DIAGNOSIS — Z9861 Coronary angioplasty status: Secondary | ICD-10-CM | POA: Insufficient documentation

## 2014-10-24 DIAGNOSIS — L03311 Cellulitis of abdominal wall: Secondary | ICD-10-CM | POA: Diagnosis not present

## 2014-10-24 DIAGNOSIS — I252 Old myocardial infarction: Secondary | ICD-10-CM | POA: Diagnosis not present

## 2014-10-24 DIAGNOSIS — T814XXA Infection following a procedure, initial encounter: Secondary | ICD-10-CM | POA: Diagnosis not present

## 2014-10-24 DIAGNOSIS — E119 Type 2 diabetes mellitus without complications: Secondary | ICD-10-CM | POA: Insufficient documentation

## 2014-10-24 DIAGNOSIS — Z79899 Other long term (current) drug therapy: Secondary | ICD-10-CM | POA: Diagnosis not present

## 2014-10-24 DIAGNOSIS — I10 Essential (primary) hypertension: Secondary | ICD-10-CM | POA: Diagnosis not present

## 2014-10-24 DIAGNOSIS — Z794 Long term (current) use of insulin: Secondary | ICD-10-CM | POA: Diagnosis not present

## 2014-10-24 LAB — CBC WITH DIFFERENTIAL/PLATELET
Basophils Absolute: 0 10*3/uL (ref 0.0–0.1)
Basophils Relative: 0 % (ref 0–1)
EOS ABS: 0.1 10*3/uL (ref 0.0–0.7)
Eosinophils Relative: 1 % (ref 0–5)
HCT: 31.7 % — ABNORMAL LOW (ref 39.0–52.0)
HEMOGLOBIN: 10.8 g/dL — AB (ref 13.0–17.0)
LYMPHS ABS: 1.2 10*3/uL (ref 0.7–4.0)
LYMPHS PCT: 13 % (ref 12–46)
MCH: 32.2 pg (ref 26.0–34.0)
MCHC: 34.1 g/dL (ref 30.0–36.0)
MCV: 94.6 fL (ref 78.0–100.0)
MONOS PCT: 9 % (ref 3–12)
Monocytes Absolute: 0.8 10*3/uL (ref 0.1–1.0)
Neutro Abs: 6.6 10*3/uL (ref 1.7–7.7)
Neutrophils Relative %: 77 % (ref 43–77)
Platelets: 227 10*3/uL (ref 150–400)
RBC: 3.35 MIL/uL — AB (ref 4.22–5.81)
RDW: 16.3 % — ABNORMAL HIGH (ref 11.5–15.5)
WBC: 8.6 10*3/uL (ref 4.0–10.5)

## 2014-10-24 LAB — COMPREHENSIVE METABOLIC PANEL
ALT: 16 U/L — ABNORMAL LOW (ref 17–63)
AST: 22 U/L (ref 15–41)
Albumin: 3.5 g/dL (ref 3.5–5.0)
Alkaline Phosphatase: 65 U/L (ref 38–126)
Anion gap: 7 (ref 5–15)
BILIRUBIN TOTAL: 1.6 mg/dL — AB (ref 0.3–1.2)
BUN: 16 mg/dL (ref 6–20)
CALCIUM: 8.8 mg/dL — AB (ref 8.9–10.3)
CO2: 24 mmol/L (ref 22–32)
Chloride: 108 mmol/L (ref 101–111)
Creatinine, Ser: 1.13 mg/dL (ref 0.61–1.24)
Glucose, Bld: 95 mg/dL (ref 70–99)
Potassium: 4.3 mmol/L (ref 3.5–5.1)
SODIUM: 139 mmol/L (ref 135–145)
Total Protein: 6.7 g/dL (ref 6.5–8.1)

## 2014-10-24 LAB — LIPASE, BLOOD: Lipase: 40 U/L (ref 22–51)

## 2014-10-24 MED ORDER — SODIUM CHLORIDE 0.9 % IV SOLN
INTRAVENOUS | Status: DC
  Administered 2014-10-24: 1 mL via INTRAVENOUS

## 2014-10-24 MED ORDER — OXYCODONE HCL 5 MG PO TABS: 5.0000 mg | ORAL_TABLET | ORAL | Status: DC | PRN

## 2014-10-24 MED ORDER — CLINDAMYCIN HCL 150 MG PO CAPS: 150.0000 mg | ORAL_CAPSULE | Freq: Four times a day (QID) | ORAL | Status: DC

## 2014-10-24 NOTE — Discharge Instructions (Signed)

## 2014-10-24 NOTE — ED Notes (Signed)
Questions, concerns denied r/t dc. Pt ambulatory and a&ox4 

## 2014-10-24 NOTE — ED Provider Notes (Signed)
CSN: [id number]     Arrival date & time 10/24/14  1118 History   First MD Initiated Contact with Patient 10/24/14 1120     Chief Complaint  Patient presents with  . Wound Infection     (Consider location/radiation/quality/duration/timing/severity/associated sxs/prior Treatment) HPI Comments: Patient here complaining of several days of erythema around the site of his abdominal catheter. Patient had a catheter placed on 10/06/2014 for treatment of cholecystitis. Denies any recent fever or chills. No abdominal pain. No vomiting noted. Called his nurse and was told to mark the area when symptoms first started and states that is been progressively worse. Patient is currently completing a course of Augmentin  The history is provided by the patient.    Past Medical History  Diagnosis Date  . Diabetes mellitus without complication   . MI (myocardial infarction)   . Hypertension   . Atrial fibrillation 10-01-14  . Coronary artery disease   . Obesity    Past Surgical History  Procedure Laterality Date  . Revision total hip arthroplasty    . Knee replacement    . Cardiac stent    . Cardiac catheterization      with stent placement in 2014  . Ankle surgery Left    No family history on file. History  Substance Use Topics  . Smoking status: Never Smoker   . Smokeless tobacco: Not on file  . Alcohol Use: No    Review of Systems  All other systems reviewed and are negative.     Allergies  Review of patient's allergies indicates no known allergies.  Home Medications   Prior to Admission medications   Medication Sig Start Date End Date Taking? Authorizing Provider  acetaminophen-codeine (TYLENOL #3) 300-30 MG per tablet Take 1 tablet by mouth 2 (two) times daily.    Historical Provider, MD  allopurinol (ZYLOPRIM) 300 MG tablet Take 300 mg by mouth daily.    Historical Provider, MD  amLODipine (NORVASC) 10 MG tablet Take 10 mg by mouth daily.    Historical Provider, MD    amoxicillin-clavulanate (AUGMENTIN) 875-125 MG per tablet Take 1 tablet by mouth 2 (two) times daily. Patient not taking: Reported on 10/24/2014 10/11/14   Jeralyn BennettEzequiel Zamora, MD  apixaban (ELIQUIS) 5 MG TABS tablet Take 1 tablet (5 mg total) by mouth 2 (two) times daily. 10/01/14   April Palumbo, MD  atorvastatin (LIPITOR) 80 MG tablet Take 80 mg by mouth daily.    Historical Provider, MD  Cholecalciferol (VITAMIN D PO) Take 1 tablet by mouth daily.     Historical Provider, MD  citalopram (CELEXA) 40 MG tablet Take 40 mg by mouth daily.    Historical Provider, MD  Cyanocobalamin (VITAMIN B 12 PO) Take 1 tablet by mouth daily.     Historical Provider, MD  furosemide (LASIX) 40 MG tablet Take 40 mg by mouth daily.     Historical Provider, MD  insulin glargine (LANTUS) 100 UNIT/ML injection Inject 40 Units into the skin at bedtime.    Historical Provider, MD  magnesium oxide (MAG-OX) 400 MG tablet Take 400 mg by mouth daily.    Historical Provider, MD  oxyCODONE (OXY IR/ROXICODONE) 5 MG immediate release tablet Take 1 tablet (5 mg total) by mouth every 6 (six) hours as needed for moderate pain. 10/11/14   Jeralyn BennettEzequiel Zamora, MD  pantoprazole (PROTONIX) 40 MG tablet Take 40 mg by mouth daily.    Historical Provider, MD  sucralfate (CARAFATE) 1 GM/10ML suspension Take 10 mLs (1 g total)  by mouth 4 (four) times daily -  with meals and at bedtime. 10/01/14   April Palumbo, MD   BP 157/76 mmHg  Pulse 91  Temp(Src) 97.8 F (36.6 C) (Oral)  Resp 18  SpO2 100% Physical Exam  Constitutional: He is oriented to person, place, and time. He appears well-developed and well-nourished.  Non-toxic appearance. No distress.  HENT:  Head: Normocephalic and atraumatic.  Eyes: Conjunctivae, EOM and lids are normal. Pupils are equal, round, and reactive to light.  Neck: Normal range of motion. Neck supple. No tracheal deviation present. No thyroid mass present.  Cardiovascular: Normal rate, regular rhythm and normal heart  sounds.  Exam reveals no gallop.   No murmur heard. Pulmonary/Chest: Effort normal and breath sounds normal. No stridor. No respiratory distress. He has no decreased breath sounds. He has no wheezes. He has no rhonchi. He has no rales.  Abdominal: Soft. Normal appearance and bowel sounds are normal. He exhibits no distension. There is no tenderness. There is no rebound and no CVA tenderness.    Musculoskeletal: Normal range of motion. He exhibits no edema or tenderness.  Neurological: He is alert and oriented to person, place, and time. He has normal strength. No cranial nerve deficit or sensory deficit. GCS eye subscore is 4. GCS verbal subscore is 5. GCS motor subscore is 6.  Skin: Skin is warm and dry. No abrasion and no rash noted.  Psychiatric: He has a normal mood and affect. His speech is normal and behavior is normal.  Nursing note and vitals reviewed.   ED Course  Procedures (including critical care time) Labs Review Labs Reviewed  CBC WITH DIFFERENTIAL/PLATELET  COMPREHENSIVE METABOLIC PANEL  LIPASE, BLOOD    Imaging Review No results found.   EKG Interpretation None      MDM   Final diagnoses:  None         Spoke with radiology and recommend patient be treated for cellulitis if there is no concern for deep tissue infection. Patient is afebrile here. No leukocytosis. Will place patient on clindamycin. Stable for discharge  Lorre NickAnthony Hetal Proano, MD 10/24/14 1318

## 2014-10-24 NOTE — ED Notes (Signed)
Pt had bile drain placed on 4/25.  4 days after returning home, pt states that he began to notice redness around the site.  Pt states the redness has been increasing and has been having pain.  Denies fevers at home.  Denies NVD.

## 2014-11-12 ENCOUNTER — Other Ambulatory Visit (INDEPENDENT_AMBULATORY_CARE_PROVIDER_SITE_OTHER): Payer: Self-pay | Admitting: General Surgery

## 2014-11-12 DIAGNOSIS — K810 Acute cholecystitis: Secondary | ICD-10-CM

## 2014-11-12 NOTE — Addendum Note (Signed)
Addended by: Kenniyah Sasaki M on: 11/12/2014 12:59 PM   Modules accepted: Orders  

## 2014-11-23 ENCOUNTER — Telehealth: Payer: Self-pay | Admitting: Cardiology

## 2014-11-23 NOTE — Telephone Encounter (Signed)
Returned call and requested that they will need to refax the surgical clearance form, if possible. Provided fax number to expedite process.

## 2014-11-23 NOTE — Telephone Encounter (Signed)
New Message      Office calling following up on a surgical clearance form that was faxed to us on 11/12/14 about pt stopping Eliquis 3 days prior to surgery. Please call back and advise.

## 2014-11-24 NOTE — Telephone Encounter (Signed)
Will forward to Dr. Brackbill's nurse, Melinda, for review and follow up.  

## 2014-11-24 NOTE — Telephone Encounter (Signed)
Reviewed the patients chart and patient only seen in the hospital  Dr Mayford Knifeurner saw patient in consult and Dr. Patty SermonsBrackbill signed off on patient and dictated patient needed to follow up with [place] [facility] [facility] Advised Amy at surgeons office that they needed to contact patients cardiologist [facility] since this patient had not been seen since hospitalization in April Amy called back stating that the patient had not followed up with a cardiologist and requested a follow up appointment  Advised Amy that someone in the scheduling department would call back with an appointment for patient

## 2014-11-24 NOTE — Telephone Encounter (Signed)
Appointment with Herma CarsonMichelle Lenze 12/16/14 Patient aware of appointment  [facility]

## 2014-12-16 ENCOUNTER — Encounter: Payer: Self-pay | Admitting: Physician Assistant

## 2014-12-16 ENCOUNTER — Ambulatory Visit (INDEPENDENT_AMBULATORY_CARE_PROVIDER_SITE_OTHER): Payer: Medicare Other | Admitting: Physician Assistant

## 2014-12-16 VITALS — BP 120/70 | HR 65 | Ht 68.0 in | Wt 253.0 lb

## 2014-12-16 DIAGNOSIS — I480 Paroxysmal atrial fibrillation: Secondary | ICD-10-CM | POA: Diagnosis not present

## 2014-12-16 DIAGNOSIS — I10 Essential (primary) hypertension: Secondary | ICD-10-CM | POA: Diagnosis not present

## 2014-12-16 DIAGNOSIS — Z01818 Encounter for other preprocedural examination: Secondary | ICD-10-CM

## 2014-12-16 DIAGNOSIS — I2510 Atherosclerotic heart disease of native coronary artery without angina pectoris: Secondary | ICD-10-CM | POA: Diagnosis not present

## 2014-12-16 NOTE — Progress Notes (Signed)
Cardiology Office Note   Date:  12/16/2014   ID:  Larry [name], DOB [birth year], MRN [id number]  PCP:  Pcp Not In System  Cardiologist:  Armanda Magic, MD Chief Complaint: surgical clearance.    History of Present Illness: Larry [name] is a 69 y.o. male who presents for surgical clearance before undergoing cholecystectomy. Patient was seen in the hospital for surgical clearance but never underwent cholecystectomy because of infection. A drain was placed and now he needs to have surgery. He does have CAD status post MI 2 years ago treated with stenting at [facility]. We do not have these records but have requested them. He also has paroxysmal atrial fibrillation and has been on Eliquis. He has maintained normal sinus rhythm. CHADVASC score is 4. He also has hypertension, diabetes mellitus, and dyslipidemia. He has been off his statin during his acute illness. 2-D echo in the hospital shows normal LV function EF 60% with no regional wall motion abnormalities. He had mild MR.  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 He does not exercise outside of this. He denies any chest pain, palpitations, dyspnea, dizziness or presyncope.    Past Medical History  Diagnosis Date  . Diabetes mellitus without complication   . MI (myocardial infarction)   . Hypertension   . Atrial fibrillation 10-01-14  . Coronary artery disease   . Obesity     Past Surgical History  Procedure Laterality Date  . Revision total hip arthroplasty    . Knee replacement    . Cardiac stent    . Cardiac catheterization      with stent placement in 2014  . Ankle surgery Left      Current Outpatient Prescriptions  Medication Sig Dispense Refill  . acetaminophen-codeine (TYLENOL #3) 300-30 MG per tablet Take 1 tablet by mouth 2 (two) times daily.    Marland Kitchen allopurinol (ZYLOPRIM) 300 MG tablet Take 300 mg by mouth daily.    Marland Kitchen amLODipine (NORVASC) 10 MG tablet  Take 10 mg by mouth daily.    Marland Kitchen apixaban (ELIQUIS) 5 MG TABS tablet Take 1 tablet (5 mg total) by mouth 2 (two) times daily. 60 tablet 0  . atorvastatin (LIPITOR) 80 MG tablet Take 80 mg by mouth daily.    . Cholecalciferol (VITAMIN D PO) Take 2,000 Units by mouth daily.     . citalopram (CELEXA) 40 MG tablet Take 40 mg by mouth daily.    . furosemide (LASIX) 40 MG tablet Take 40 mg by mouth daily.     . insulin glargine (LANTUS) 100 UNIT/ML injection Inject 40 Units into the skin at bedtime.    . Liraglutide 18 MG/3ML SOPN Inject 1.2 mg into the skin daily.    . magnesium oxide (MAG-OX) 400 MG tablet Take 400 mg by mouth daily.    . metFORMIN (GLUCOPHAGE) 500 MG tablet Take 500 mg by mouth 2 (two) times daily with a meal.    . amoxicillin-clavulanate (AUGMENTIN) 875-125 MG per tablet Take 1 tablet by mouth 2 (two) times daily. (Patient not taking: Reported on 12/16/2014) 14 tablet 0  . aspirin 81 MG tablet Take 81 mg by mouth daily.    . clindamycin (CLEOCIN) 150 MG capsule Take 1 capsule (150 mg total) by mouth every 6 (six) hours. (Patient not taking: Reported on 12/16/2014) 28 capsule 0  . ondansetron (ZOFRAN) 8 MG tablet Take 4 mg by mouth 2 (two) times  daily.     No current facility-administered medications for this visit.    Allergies:   Review of patient's allergies indicates no known allergies.    Social History:  The patient  reports that he has never smoked. He does not have any smokeless tobacco history on file. He reports that he does not drink alcohol or use illicit drugs.   Family History:  The patient's    family history includes Diabetes in his daughter, sister, and son; Heart attack in his father; Heart disease in his father; Heart failure in his father.    ROS:  Please see the history of present illness.   Otherwise, review of systems are positive for none.   All other systems are reviewed and negative.    PHYSICAL EXAM: VS:  BP 120/70 mmHg  Pulse 65  Ht 5\' 8"  (1.727  m)  Wt 253 lb (114.76 kg)  BMI 38.48 kg/m2 , BMI Body mass index is 38.48 kg/(m^2). GEN: Well nourished, well developed, in no acute distress Neck: no JVD, HJR, carotid bruits, or masses Cardiac: RRR; 1 to 2/6 systolic murmur at the left sternal border, positive S4, rubs, thrill or heave,  Respiratory:  clear to auscultation bilaterally, normal work of breathing GI: soft, nontender, nondistended, + BS MS: no deformity or atrophy Extremities: without cyanosis, clubbing, edema, good distal pulses bilaterally.  Skin: warm and dry, no rash Neuro:  Strength and sensation are intact    EKG:  EKG is ordered today. The ekg ordered today demonstrates normal sinus rhythm, inferior Q waves, no acute change   Recent Labs: 10/06/2014: TSH 3.295 10/08/2014: Magnesium 2.0 10/24/2014: ALT 16*; BUN 16; Creatinine, Ser 1.13; Hemoglobin 10.8*; Platelets 227; Potassium 4.3; Sodium 139    Lipid Panel No results found for: CHOL, TRIG, HDL, CHOLHDL, VLDL, LDLCALC, LDLDIRECT    Wt Readings from Last 3 Encounters:  12/16/14 253 lb (114.76 kg)  10/08/14 264 lb 1.8 oz (119.8 kg)  10/01/14 266 lb (120.657 kg)      Other studies Reviewed: Additional studies/ records that were reviewed today include and review of the records demonstrates:   2D echo - Left ventricle: The cavity size was normal. Systolic function was   normal. The estimated ejection fraction was in the range of 55%   to 60%. Wall motion was normal; there were no regional wall   motion abnormalities. - Mitral valve: There was mild regurgitation. - Left atrium: The atrium was mildly dilated. - Right ventricle: The cavity size was dilated. Wall thickness was   normal. - Right atrium: The atrium was mildly to moderately dilated.   [facility] reviewed see my discussion under plan.  ASSESSMENT AND PLAN: Preoperative clearance Patient is here for preoperative clearance before undergoing going  laparoscopic cholecystectomy. He has history of CAD status post inferior MI 2 years ago [facility]2013 with stenting proximal RCA with distal embolization in the mid RCA treated with PTCA and embolization the distal RCA treated with fetch thrombectomy, could not restore flow. EF 45% that time has since recovered .  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 He is at moderate but acceptable risk for surgery. Patient has normal LV function on 2-D echo. He also has paroxysmal atrial fibrillation and is on Eliquis which can be held for surgery and reinitiated afterwards. I discussed this patient with Dr. Tenny Crawoss who concurs that patient can proceed. F/U with Dr. Mayford Knifeurner  in 3 months. [facility].  Paroxysmal atrial fibrillation Maintaining normal sinus rhythm  HTN (hypertension) Patient brought in a spread sheet of his blood pressures and they have been controlled  CAD (coronary artery disease) Her GERD just obtained from [place] [facility] dated 02/25/13. Patient had severe stenosis of the RCA and underwent successful PCI/thrombectomy and drug-eluting stent to the proximal RCA with distal embolization in the mid RCA treated with PTCA. Embolization of the distal RCA was treated with fetch thrombectomy could not restore flow. There was moderate stenosis of the circumflex and otherwise nonobstructive CAD. LVEF was 45% at that time.     Signed, Jacolyn Reedy, PA-C  12/16/2014 1:01 PM    [facility] [address] [place], [place], [place]  [address] Phone: [phone]; Fax: [phone]

## 2014-12-16 NOTE — Assessment & Plan Note (Signed)
Maintaining normal sinus rhythm 

## 2014-12-16 NOTE — Assessment & Plan Note (Addendum)
Her [facility] dated 02/25/13. Patient had severe stenosis of the RCA and underwent successful PCI/thrombectomy and drug-eluting stent to the proximal RCA with distal embolization in the mid RCA treated with PTCA. Embolization of the distal RCA was treated with fetch thrombectomy could not restore flow. There was moderate stenosis of the circumflex and otherwise nonobstructive CAD. LVEF was 45% at that time.

## 2014-12-16 NOTE — Assessment & Plan Note (Signed)
Patient brought in a spread sheet of his blood pressures and they have been controlled

## 2014-12-16 NOTE — Patient Instructions (Signed)
Medication Instructions:  Your physician recommends that you continue on your current medications as directed. Please refer to the Current Medication list given to you today.  Labwork: No new orders.   Testing/Procedures: No new orders.   Follow-Up: You are cleared for gallbladder surgery. Please contact our office with the name of surgeon and we will forward your records.   Your physician wants you to follow-up in: 3 MONTHS with Dr Mayford Knifeurner. You will receive a reminder letter in the mail two months in advance. If you don't receive a letter, please call our office to schedule the follow-up appointment.   Any Other Special Instructions Will Be Listed Below (If Applicable).

## 2014-12-16 NOTE — Assessment & Plan Note (Addendum)
Patient is here for preoperative clearance before undergoing going laparoscopic cholecystectomy. He has history of CAD status post inferior MI 2 years ago [facility] with stenting proximal RCA with distal embolization in the mid RCA treated with PTCA and embolization the distal RCA treated with fetch thrombectomy, could not restore flow. EF 45% that time has since recovered .  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 He is at moderate but acceptable risk for surgery. Patient has normal LV function on 2-D echo. He also has paroxysmal atrial fibrillation and is on Eliquis which can be held for surgery and reinitiated afterwards. I discussed this patient with Dr. Tenny Crawoss who concurs that patient can proceed. F/U with Dr. Mayford Knifeurner in 3 months. [facility].

## 2014-12-21 ENCOUNTER — Emergency Department (HOSPITAL_COMMUNITY)
Admission: EM | Admit: 2014-12-21 | Discharge: 2014-12-21 | Discharge status: Absent without leave | Payer: Medicare Other

## 2014-12-21 ENCOUNTER — Other Ambulatory Visit (HOSPITAL_COMMUNITY): Payer: Self-pay | Admitting: Emergency Medicine

## 2014-12-21 ENCOUNTER — Ambulatory Visit (HOSPITAL_COMMUNITY)
Admission: RE | Admit: 2014-12-21 | Discharge: 2014-12-21 | Disposition: A | Discharge status: Home / Self Care | Payer: Medicare Other | Source: Ambulatory Visit | Attending: Emergency Medicine | Admitting: Emergency Medicine

## 2014-12-21 DIAGNOSIS — K819 Cholecystitis, unspecified: Secondary | ICD-10-CM

## 2014-12-21 NOTE — Procedures (Signed)
Pt came to the ER with complaint of leaking cholecystostomy tube bag. I went to the ER to evaluate and exchange the bag.  At this time the patient stated that the stiches were causing a lot of discomfort and pain.  I offered to place a statlock as a suture alternative.  The patient was happy with this option.  The stitch was removed and the stat lock secured.  I gave the patient the contact information for the Ir department and advised him if he had any additional concerns with the tube or bag to call the department directly.  He has a follow up appointment with his physician on Thursday to discuss his surgery and if the tube needs to be changed.

## 2014-12-29 NOTE — Progress Notes (Signed)
Please put orders in Epic surgery 01-04-15 pre op 12-31-14 Thanks

## 2014-12-30 ENCOUNTER — Encounter: Payer: Self-pay | Admitting: Cardiology

## 2014-12-30 ENCOUNTER — Ambulatory Visit: Payer: Self-pay | Admitting: General Surgery

## 2014-12-30 ENCOUNTER — Encounter (HOSPITAL_COMMUNITY): Payer: Self-pay

## 2014-12-30 NOTE — Patient Instructions (Addendum)
[address]  12/30/2014   Your procedure is scheduled on:   01-04-2015 Tuesday  Enter through [facility]  Entrance and follow signs to [facility]. Arrive at     0930   AM .  (Limit 1 person with you).  Call this number if you have problems the morning of surgery: [phone]  Or Presurgical Testing [phone].   For Living Will and/or Health Care Power Attorney Forms: please provide copy for your medical record,may bring AM of surgery(Forms should be already notarized -we do not provide this service).(12-31-14  No information preferred today).      Do not eat food/ or drink: After Midnight.      Take these medicines the morning of surgery with A SIP OF WATER: Amlodipine. Allopurinol. Citalopram. Pantoprazole. Lantus( 1/2 usual PM dose)night before. Take no Insulin or Diabetic meds AM of.   Do not wear jewelry, make-up or nail polish.  Do not wear deodorant, lotions, powders, or perfumes.   Do not shave legs and under arms- 48 hours(2 days) prior to first CHG shower.(Shaving face and neck okay.)  Do not bring valuables to the hospital.(Hospital is not responsible for lost valuables).  Contacts, dentures or removable bridgework, body piercing, hair pins may not be worn into surgery.  Leave suitcase in the car. After surgery it may be brought to your room.  For patients admitted to the hospital, checkout time is 11:00 AM the day of discharge.(Restricted visitors-Any Persons displaying flu-like symptoms or illness).    Patients discharged the day of surgery will not be allowed to drive home. Must have responsible person with you x 24 hours once discharged.  Name and phone number of your driver: Jaylene Schrom -spouse [phone] cell     Please read over the following fact sheets that you were given:  CHG(Chlorhexidine Gluconate 4% Surgical Soap) use.           [place] - Preparing for Surgery Before surgery, you can play an important role.  Because skin is  not sterile, your skin needs to be as free of germs as possible.  You can reduce the number of germs on your skin by washing with CHG (chlorahexidine gluconate) soap before surgery.  CHG is an antiseptic cleaner which kills germs and bonds with the skin to continue killing germs even after washing. Please DO NOT use if you have an allergy to CHG or antibacterial soaps.  If your skin becomes reddened/irritated stop using the CHG and inform your nurse when you arrive at Short Stay. Do not shave (including legs and underarms) for at least 48 hours prior to the first CHG shower.  You may shave your face/neck. Please follow these instructions carefully:  1.  Shower with CHG Soap the night before surgery and the  morning of Surgery.  2.  If you choose to wash your hair, wash your hair first as usual with your  normal  shampoo.  3.  After you shampoo, rinse your hair and body thoroughly to remove the  shampoo.                           4.  Use CHG as you would any other liquid soap.  You can apply chg directly  to the skin and wash                       Gently with a scrungie or  clean washcloth.  5.  Apply the CHG Soap to your body ONLY FROM THE NECK DOWN.   Do not use on face/ open                           Wound or open sores. Avoid contact with eyes, ears mouth and genitals (private parts).                       Wash face,  Genitals (private parts) with your normal soap.             6.  Wash thoroughly, paying special attention to the area where your surgery  will be performed.  7.  Thoroughly rinse your body with warm water from the neck down.  8.  DO NOT shower/wash with your normal soap after using and rinsing off  the CHG Soap.                9.  Pat yourself dry with a clean towel.            10.  Wear clean pajamas.            11.  Place clean sheets on your bed the night of your first shower and do not  sleep with pets. Day of Surgery : Do not apply any lotions/deodorants the morning of surgery.   Please wear clean clothes to the hospital/surgery center.  FAILURE TO FOLLOW THESE INSTRUCTIONS MAY RESULT IN THE CANCELLATION OF YOUR SURGERY PATIENT SIGNATURE_________________________________  NURSE SIGNATURE__________________________________  ________________________________________________________________________

## 2014-12-30 NOTE — H&P (Signed)
Larry [name] 11/12/2014 8:38 AM Location: [facility] Patient #: [id number] DOB: 06-26-1945 Married / Language: English / Race: White Male  History of Present Illness Larry [name]; 11/12/2014 9:43 AM) Patient words: Recheck Gallbladder.  The patient is a 69 year old male who presents for evaluation of gall stones. He comes in for follow-up after being initially evaluated while an inpatient in the hospital for acute calculus cholecystitis. On admission to the hospital he was found to have sepsis. It was also unclear how long he been symptomatic. Moreover he had had a myocardial infarction about 2 years ago with what sounded to be a drug-eluting stent placed but never had cardiology follow-up per the patient. It was best felt that he would undergo cholecystostomy tube placement by interventional radiology which was done on April 20. He was discharged to home on April 25. He was evaluated by cardiology while an inpatient. He does have a history of paroxysmal atrial fibrillation and takes Eliquis. He states he is doing well. He reports normal energy level. He denies any chest pain or chest pressure. He denies any nausea, vomiting, constipation. The drain stopped draining fluid about a week and a half ago. He reports a good appetite. He denies any TIAs or amaurosis fugax. He has some occasional lower extremity edema in his left leg.   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 Hip Surgery Left. Knee  Surgery Left.  Diagnostic Studies History Larry [name]; 1/47/8295 6:21 AM) Colonoscopy 1-5 years ago  Allergies Larry [name] [place], Larry [name]; 08/23/6576 4:69 AM) No Known Drug Allergies05/27/2016  Medication History Larry [name]; 12/15/5282 1:32 AM) OxyCODONE HCl (  Tablet, Oral as needed) Active. Clindamycin HCl (  Capsule, Oral) Active. Amoxicillin-Pot Clavulanate (875-125MG  Tablet, Oral) Active. Allopurinol (  Tablet, Oral) Active. Eliquis (  Tablet, Oral) Active. Lipitor (  Tablet, Oral) Active. Vitamin D (Cholecalciferol) (1000UNIT Tablet, Oral) Active. CeleXA (  Tablet, Oral daily) Active. Lasix (  Tablet, Oral daily) Active. Lantus SoloStar (100UNIT/ML Soln Pen-inj, Subcutaneous) Active. Magnesium Oxide (  Capsule, Oral daily) Active. MetFORMIN HCl (  Tablet, Oral daily) Active. Zofran ODT (  Tablet Disperse, Oral as needed) Active. Protonix (  Packet, Oral daily) Active. Medications Reconciled  Social History Larry [name]; 4/40/1027 2:53 AM) No alcohol use No caffeine use No drug use Tobacco use Never smoker.  Family History Larry [name]; 6/64/4034 7:42 AM) Diabetes Mellitus Daughter, Sister, Son.  Review of Systems Larry [name]; 5/95/6387 5:64 AM) General Not Present- Appetite Loss, Chills, Fatigue, Fever, Night Sweats, Weight Gain and Weight Loss. Skin Not Present- Change in Wart/Mole, Dryness, Hives, Jaundice, New Lesions, Non-Healing Wounds, Rash and Ulcer. HEENT Not Present- Earache, Hearing Loss, Hoarseness, Nose Bleed, Oral Ulcers, Ringing in the Ears, Seasonal Allergies, Sinus Pain, Sore Throat, Visual Disturbances, Wears glasses/contact lenses and Yellow Eyes. Respiratory Not Present- Bloody sputum, Chronic Cough, Difficulty Breathing, Snoring and Wheezing. Breast Not Present- Breast Mass, Breast Pain, Nipple Discharge and Skin Changes. Cardiovascular Not Present- Chest Pain,  Difficulty Breathing Lying Down, Leg Cramps, Palpitations, Rapid Heart Rate, Shortness of Breath and Swelling of Extremities. Gastrointestinal Not Present- Abdominal Pain, Bloating, Bloody Stool, Change in Bowel  Habits, Chronic diarrhea, Constipation, Difficulty Swallowing, Excessive gas, Gets full quickly at meals, Hemorrhoids, Indigestion, Nausea, Rectal Pain and Vomiting. Male Genitourinary Not Present- Blood in Urine, Change in Urinary Stream, Frequency, Impotence, Nocturia, Painful Urination, Urgency and Urine Leakage. Musculoskeletal Present- Back Pain. Not Present- Joint Pain, Joint Stiffness, Muscle Pain, Muscle Weakness and Swelling of Extremities. Neurological Not Present- Decreased Memory, Fainting, Headaches, Numbness, Seizures, Tingling, Tremor, Trouble walking and Weakness. Psychiatric Not Present- Anxiety, Bipolar, Change in Sleep Pattern, Depression, Fearful and Frequent crying. Hematology Not Present- Easy Bruising, Excessive bleeding, Gland problems, HIV and Persistent Infections.   Vitals Larry [name](Larry [name]; 7/82/95625/27/2016 1:308:39 AM) 11/12/2014 8:39 AM Weight: 256.13 lb Height: 67in Body Surface Area: 2.34 m Body Mass Index: 40.11 kg/m Temp.: 98.69F(Oral)  Pulse: 78 (Regular)  BP: 150/80 (Sitting, Left Arm, Standard)    Physical Exam Larry [name]; 11/12/2014 9:38 AM) General Mental Status-Alert. General Appearance-Consistent with stated age. Hydration-Well hydrated. Voice-Normal. Note: morbidly obese   Head and Neck Head-normocephalic, atraumatic with no lesions or palpable masses. Trachea-midline. Thyroid Gland Characteristics - normal size and consistency.  Eye Eyeball - Bilateral-Extraocular movements intact. Sclera/Conjunctiva - Bilateral-No scleral icterus.  Chest and Lung Exam Chest and lung exam reveals -quiet, even and easy respiratory effort with no use of accessory muscles and on auscultation, normal breath sounds, no  adventitious sounds and normal vocal resonance. Inspection Chest Wall - Normal. Back - normal.  Breast - Did not examine.  Cardiovascular Cardiovascular examination reveals -normal heart sounds, regular rate and rhythm with no murmurs and normal pedal pulses bilaterally.  Abdomen Inspection Inspection of the abdomen reveals - No Hernias. Skin - Scar - no surgical scars. Palpation/Percussion Palpation and Percussion of the abdomen reveal - Soft, Non Tender, No Rebound tenderness, No Rigidity (guarding) and No hepatosplenomegaly. Auscultation Auscultation of the abdomen reveals - Bowel sounds normal. Note: GB drain - high RUQ. skin intact.   Peripheral Vascular Upper Extremity Palpation - Pulses bilaterally normal.  Neurologic Neurologic evaluation reveals -alert and oriented x 3 with no impairment of recent or remote memory. Mental Status-Normal.  Neuropsychiatric The patient's mood and affect are described as -normal. Judgment and Insight-insight is appropriate concerning matters relevant to self.  Musculoskeletal Normal Exam - Left-Upper Extremity Strength Normal and Lower Extremity Strength Normal. Normal Exam - Right-Upper Extremity Strength Normal and Lower Extremity Strength Normal.  Lymphatic Head & Neck  General Head & Neck Lymphatics: Bilateral - Description - Normal. Axillary - Did not examine. Femoral & Inguinal - Did not examine.    Assessment & Plan Larry [name](Barrett Goldie M. 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 If his cystic duct is not patent then I would deftly recommend cholecystectomy since his chance of recurrent cholecystitis would be very high. If his cystic duct is patent we did discuss removal of the drain versus proceeding with cholecystectomy.  We discussed the possibility of recurrent cholecystitis. Given the fact that he has cholelithiasis I believe his chance of recurrent cholecystitis in the future is greater than 50%. Therefore they have decided to proceed with cholecystectomy. We will need to get cardiology clearance as well. We will also need to get permission to hold his Eliquis around the time of surgery  I believe the patient's symptoms are consistent with gallbladder disease.  We discussed gallbladder disease.  The patient was given Agricultural engineer. We discussed non-operative and operative management. We discussed the signs & symptoms of acute cholecystitis  I discussed laparoscopic cholecystectomy with IOC in detail with possible open. The patient was given educational material as well as diagrams detailing the procedure. We discussed the risks and benefits of a laparoscopic cholecystectomy including, but not limited to bleeding, infection, injury to surrounding structures such as the intestine or liver, bile leak, retained gallstones, need to convert to an open procedure, prolonged diarrhea, blood clots such as DVT, common bile duct injury, anesthesia risks, and possible need for additional procedures. We discussed the typical post-operative recovery course. I explained that the likelihood of improvement of their symptoms is good. Current Plans  Pt Education - Pamphlet Given - Laparoscopic Gallbladder Surgery: discussed with patient and provided information. Pt Education - [facility] Free Text Education/Instructions: discussed with patient and provided information. 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 [name], [name], FACS General, Bariatric, & Minimally Invasive  [facility], [place]

## 2014-12-31 ENCOUNTER — Encounter (HOSPITAL_COMMUNITY): Payer: Self-pay

## 2014-12-31 ENCOUNTER — Encounter (HOSPITAL_COMMUNITY)
Admission: RE | Admit: 2014-12-31 | Discharge: 2014-12-31 | Disposition: A | Discharge status: Home / Self Care | Payer: Medicare Other | Source: Ambulatory Visit | Attending: General Surgery | Admitting: General Surgery

## 2014-12-31 DIAGNOSIS — Z01812 Encounter for preprocedural laboratory examination: Secondary | ICD-10-CM | POA: Diagnosis present

## 2014-12-31 DIAGNOSIS — K8020 Calculus of gallbladder without cholecystitis without obstruction: Secondary | ICD-10-CM | POA: Insufficient documentation

## 2014-12-31 HISTORY — DX: Unspecified multiple injuries, initial encounter: T07.XXXA

## 2014-12-31 HISTORY — DX: Encounter for attention to other artificial openings of digestive tract: Z43.4

## 2014-12-31 LAB — CBC WITH DIFFERENTIAL/PLATELET
BASOS PCT: 0 % (ref 0–1)
Basophils Absolute: 0 10*3/uL (ref 0.0–0.1)
EOS ABS: 0.2 10*3/uL (ref 0.0–0.7)
EOS PCT: 2 % (ref 0–5)
HCT: 37.1 % — ABNORMAL LOW (ref 39.0–52.0)
HEMOGLOBIN: 12 g/dL — AB (ref 13.0–17.0)
Lymphocytes Relative: 20 % (ref 12–46)
Lymphs Abs: 1.5 10*3/uL (ref 0.7–4.0)
MCH: 30.8 pg (ref 26.0–34.0)
MCHC: 32.3 g/dL (ref 30.0–36.0)
MCV: 95.1 fL (ref 78.0–100.0)
MONO ABS: 0.5 10*3/uL (ref 0.1–1.0)
MONOS PCT: 7 % (ref 3–12)
NEUTROS ABS: 5.1 10*3/uL (ref 1.7–7.7)
NEUTROS PCT: 71 % (ref 43–77)
Platelets: 239 10*3/uL (ref 150–400)
RBC: 3.9 MIL/uL — AB (ref 4.22–5.81)
RDW: 15.3 % (ref 11.5–15.5)
WBC: 7.2 10*3/uL (ref 4.0–10.5)

## 2014-12-31 LAB — COMPREHENSIVE METABOLIC PANEL
ALT: 26 U/L (ref 17–63)
AST: 35 U/L (ref 15–41)
Albumin: 3.9 g/dL (ref 3.5–5.0)
Alkaline Phosphatase: 84 U/L (ref 38–126)
Anion gap: 3 — ABNORMAL LOW (ref 5–15)
BUN: 17 mg/dL (ref 6–20)
CALCIUM: 8.8 mg/dL — AB (ref 8.9–10.3)
CHLORIDE: 108 mmol/L (ref 101–111)
CO2: 29 mmol/L (ref 22–32)
CREATININE: 1.08 mg/dL (ref 0.61–1.24)
GFR calc Af Amer: >60 mL/min (ref >60)
GFR calc non Af Amer: >60 mL/min (ref >60)
Glucose, Bld: 231 mg/dL — ABNORMAL HIGH (ref 65–99)
POTASSIUM: 4.7 mmol/L (ref 3.5–5.1)
Sodium: 140 mmol/L (ref 135–145)
TOTAL PROTEIN: 6.9 g/dL (ref 6.5–8.1)
Total Bilirubin: 1.3 mg/dL — ABNORMAL HIGH (ref 0.3–1.2)

## 2014-12-31 NOTE — Pre-Procedure Instructions (Signed)
EKG 6'16,Echo 4'16, CXR 4'16 CT abd/pelvis 4'16. Cardiac Cath results 9'14(report with chart-[place])

## 2014-12-31 NOTE — Progress Notes (Signed)
Your Pt has screened with an elevated risk for obstructive sleep apnea using the Stop-Bang tool during a presurgical  Visit. A score of four or greater is an elevated risk. 

## 2015-01-04 ENCOUNTER — Encounter (HOSPITAL_COMMUNITY): Payer: Self-pay

## 2015-01-04 ENCOUNTER — Ambulatory Visit (HOSPITAL_COMMUNITY): Payer: Medicare Other | Admitting: Registered Nurse

## 2015-01-04 ENCOUNTER — Observation Stay (HOSPITAL_COMMUNITY)
Admission: RE | Admit: 2015-01-04 | Discharge: 2015-01-06 | Disposition: A | Discharge status: Home / Self Care | Payer: Medicare Other | Source: Ambulatory Visit | Attending: General Surgery | Admitting: General Surgery

## 2015-01-04 ENCOUNTER — Ambulatory Visit (HOSPITAL_COMMUNITY): Payer: Medicare Other

## 2015-01-04 ENCOUNTER — Encounter (HOSPITAL_COMMUNITY)
Admission: RE | Disposition: A | Discharge status: Home / Self Care | Payer: Self-pay | Source: Ambulatory Visit | Attending: General Surgery

## 2015-01-04 DIAGNOSIS — I480 Paroxysmal atrial fibrillation: Secondary | ICD-10-CM | POA: Insufficient documentation

## 2015-01-04 DIAGNOSIS — K8010 Calculus of gallbladder with chronic cholecystitis without obstruction: Principal | ICD-10-CM | POA: Insufficient documentation

## 2015-01-04 DIAGNOSIS — Z79899 Other long term (current) drug therapy: Secondary | ICD-10-CM | POA: Diagnosis not present

## 2015-01-04 DIAGNOSIS — K219 Gastro-esophageal reflux disease without esophagitis: Secondary | ICD-10-CM | POA: Diagnosis not present

## 2015-01-04 DIAGNOSIS — K811 Chronic cholecystitis: Secondary | ICD-10-CM | POA: Diagnosis present

## 2015-01-04 DIAGNOSIS — E669 Obesity, unspecified: Secondary | ICD-10-CM | POA: Diagnosis present

## 2015-01-04 DIAGNOSIS — E119 Type 2 diabetes mellitus without complications: Secondary | ICD-10-CM | POA: Diagnosis not present

## 2015-01-04 DIAGNOSIS — I252 Old myocardial infarction: Secondary | ICD-10-CM | POA: Diagnosis not present

## 2015-01-04 DIAGNOSIS — Z794 Long term (current) use of insulin: Secondary | ICD-10-CM | POA: Insufficient documentation

## 2015-01-04 DIAGNOSIS — Z7901 Long term (current) use of anticoagulants: Secondary | ICD-10-CM | POA: Insufficient documentation

## 2015-01-04 DIAGNOSIS — I10 Essential (primary) hypertension: Secondary | ICD-10-CM | POA: Insufficient documentation

## 2015-01-04 DIAGNOSIS — M549 Dorsalgia, unspecified: Secondary | ICD-10-CM | POA: Diagnosis not present

## 2015-01-04 DIAGNOSIS — Z6839 Body mass index (BMI) 39.0-39.9, adult: Secondary | ICD-10-CM | POA: Insufficient documentation

## 2015-01-04 DIAGNOSIS — I2510 Atherosclerotic heart disease of native coronary artery without angina pectoris: Secondary | ICD-10-CM | POA: Diagnosis present

## 2015-01-04 DIAGNOSIS — K810 Acute cholecystitis: Secondary | ICD-10-CM

## 2015-01-04 DIAGNOSIS — Z419 Encounter for procedure for purposes other than remedying health state, unspecified: Secondary | ICD-10-CM

## 2015-01-04 DIAGNOSIS — K819 Cholecystitis, unspecified: Secondary | ICD-10-CM

## 2015-01-04 HISTORY — PX: CHOLECYSTECTOMY: SHX55

## 2015-01-04 LAB — GLUCOSE, CAPILLARY
GLUCOSE-CAPILLARY: 226 mg/dL — AB (ref 65–99)
GLUCOSE-CAPILLARY: 159 mg/dL — AB (ref 65–99)
Glucose-Capillary: 151 mg/dL — ABNORMAL HIGH (ref 65–99)
Glucose-Capillary: 250 mg/dL — ABNORMAL HIGH (ref 65–99)

## 2015-01-04 SURGERY — LAPAROSCOPIC CHOLECYSTECTOMY
Anesthesia: General | Site: Abdomen

## 2015-01-04 MED ORDER — NEOSTIGMINE METHYLSULFATE 10 MG/10ML IV SOLN
INTRAVENOUS | Status: DC | PRN
  Administered 2015-01-04: 5 mg via INTRAVENOUS

## 2015-01-04 MED ORDER — BUPIVACAINE-EPINEPHRINE 0.25% -1:200000 IJ SOLN
INTRAMUSCULAR | Status: DC | PRN
  Administered 2015-01-04: 20 mL

## 2015-01-04 MED ORDER — ACETAMINOPHEN 325 MG PO TABS
650.0000 mg | ORAL_TABLET | ORAL | Status: DC | PRN
  Administered 2015-01-06: 650 mg via ORAL
  Filled 2015-01-04: qty 2

## 2015-01-04 MED ORDER — DEXTROSE 5 % IV SOLN
2.0000 g | INTRAVENOUS | Status: AC
  Administered 2015-01-04 (×2): 2 g via INTRAVENOUS

## 2015-01-04 MED ORDER — LACTATED RINGERS IV SOLN
INTRAVENOUS | Status: DC
  Administered 2015-01-04: 1000 mL via INTRAVENOUS
  Administered 2015-01-04: 12:00:00 via INTRAVENOUS

## 2015-01-04 MED ORDER — LACTATED RINGERS IR SOLN
Status: DC | PRN
  Administered 2015-01-04: 3000 mL

## 2015-01-04 MED ORDER — INSULIN ASPART 100 UNIT/ML ~~LOC~~ SOLN
SUBCUTANEOUS | Status: AC
  Filled 2015-01-04: qty 1

## 2015-01-04 MED ORDER — DEXTROSE 5 % IV SOLN
INTRAVENOUS | Status: AC
  Filled 2015-01-04: qty 2

## 2015-01-04 MED ORDER — PROPOFOL 10 MG/ML IV BOLUS
INTRAVENOUS | Status: DC | PRN
  Administered 2015-01-04: 200 mg via INTRAVENOUS
  Administered 2015-01-04: 50 mg via INTRAVENOUS

## 2015-01-04 MED ORDER — ONDANSETRON HCL 4 MG/2ML IJ SOLN: 4.0000 mg | Freq: Four times a day (QID) | INTRAMUSCULAR | Status: DC | PRN

## 2015-01-04 MED ORDER — HEPARIN SODIUM (PORCINE) 5000 UNIT/ML IJ SOLN
5000.0000 [IU] | Freq: Three times a day (TID) | INTRAMUSCULAR | Status: DC
  Administered 2015-01-05 – 2015-01-06 (×2): 5000 [IU] via SUBCUTANEOUS
  Filled 2015-01-04 (×3): qty 1

## 2015-01-04 MED ORDER — PHENYLEPHRINE 40 MCG/ML (10ML) SYRINGE FOR IV PUSH (FOR BLOOD PRESSURE SUPPORT)
PREFILLED_SYRINGE | INTRAVENOUS | Status: AC
  Filled 2015-01-04: qty 10

## 2015-01-04 MED ORDER — SODIUM CHLORIDE 0.9 % IV SOLN
INTRAVENOUS | Status: DC | PRN
  Administered 2015-01-04: 6.5 mL

## 2015-01-04 MED ORDER — SUCCINYLCHOLINE CHLORIDE 20 MG/ML IJ SOLN
INTRAMUSCULAR | Status: DC | PRN
  Administered 2015-01-04: 120 mg via INTRAVENOUS

## 2015-01-04 MED ORDER — PROMETHAZINE HCL 25 MG/ML IJ SOLN
6.2500 mg | INTRAMUSCULAR | Status: DC | PRN
  Administered 2015-01-04: 6.25 mg via INTRAVENOUS

## 2015-01-04 MED ORDER — PROPOFOL 10 MG/ML IV BOLUS
INTRAVENOUS | Status: AC
  Filled 2015-01-04: qty 20

## 2015-01-04 MED ORDER — ONDANSETRON HCL 4 MG/2ML IJ SOLN
INTRAMUSCULAR | Status: DC | PRN
  Administered 2015-01-04: 4 mg via INTRAVENOUS

## 2015-01-04 MED ORDER — CHLORHEXIDINE GLUCONATE 4 % EX LIQD: 1.0000 "application " | Freq: Once | CUTANEOUS | Status: DC

## 2015-01-04 MED ORDER — LIDOCAINE HCL (CARDIAC) 20 MG/ML IV SOLN
INTRAVENOUS | Status: AC
  Filled 2015-01-04: qty 5

## 2015-01-04 MED ORDER — FENTANYL CITRATE (PF) 250 MCG/5ML IJ SOLN
INTRAMUSCULAR | Status: AC
  Filled 2015-01-04: qty 5

## 2015-01-04 MED ORDER — 0.9 % SODIUM CHLORIDE (POUR BTL) OPTIME
TOPICAL | Status: DC | PRN
  Administered 2015-01-04: 1000 mL

## 2015-01-04 MED ORDER — LISINOPRIL 40 MG PO TABS
40.0000 mg | ORAL_TABLET | Freq: Two times a day (BID) | ORAL | Status: DC
  Administered 2015-01-04 – 2015-01-06 (×4): 40 mg via ORAL
  Filled 2015-01-04 (×4): qty 1

## 2015-01-04 MED ORDER — ROCURONIUM BROMIDE 100 MG/10ML IV SOLN
INTRAVENOUS | Status: DC | PRN
  Administered 2015-01-04 (×8): 10 mg via INTRAVENOUS
  Administered 2015-01-04: 30 mg via INTRAVENOUS

## 2015-01-04 MED ORDER — MIDAZOLAM HCL 5 MG/5ML IJ SOLN
INTRAMUSCULAR | Status: DC | PRN
  Administered 2015-01-04: 2 mg via INTRAVENOUS

## 2015-01-04 MED ORDER — HYDROMORPHONE HCL 1 MG/ML IJ SOLN
INTRAMUSCULAR | Status: AC
  Filled 2015-01-04: qty 1

## 2015-01-04 MED ORDER — MORPHINE SULFATE 2 MG/ML IJ SOLN
1.0000 mg | INTRAMUSCULAR | Status: DC | PRN
  Administered 2015-01-04 – 2015-01-05 (×2): 1 mg via INTRAVENOUS
  Administered 2015-01-06 (×2): 2 mg via INTRAVENOUS
  Filled 2015-01-04 (×4): qty 1

## 2015-01-04 MED ORDER — FENTANYL CITRATE (PF) 100 MCG/2ML IJ SOLN
INTRAMUSCULAR | Status: DC | PRN
  Administered 2015-01-04: 50 ug via INTRAVENOUS
  Administered 2015-01-04: 100 ug via INTRAVENOUS
  Administered 2015-01-04 (×3): 50 ug via INTRAVENOUS
  Administered 2015-01-04: 100 ug via INTRAVENOUS
  Administered 2015-01-04 (×2): 50 ug via INTRAVENOUS

## 2015-01-04 MED ORDER — ONDANSETRON HCL 4 MG/2ML IJ SOLN
INTRAMUSCULAR | Status: AC
  Filled 2015-01-04: qty 2

## 2015-01-04 MED ORDER — INSULIN ASPART 100 UNIT/ML ~~LOC~~ SOLN
5.0000 [IU] | Freq: Once | SUBCUTANEOUS | Status: AC
  Administered 2015-01-04: 5 [IU] via SUBCUTANEOUS

## 2015-01-04 MED ORDER — FUROSEMIDE 40 MG PO TABS
40.0000 mg | ORAL_TABLET | Freq: Every morning | ORAL | Status: DC
  Administered 2015-01-05 – 2015-01-06 (×2): 40 mg via ORAL
  Filled 2015-01-04 (×2): qty 1

## 2015-01-04 MED ORDER — ALLOPURINOL 300 MG PO TABS
300.0000 mg | ORAL_TABLET | Freq: Every day | ORAL | Status: DC
  Administered 2015-01-05 – 2015-01-06 (×2): 300 mg via ORAL
  Filled 2015-01-04 (×2): qty 1

## 2015-01-04 MED ORDER — INSULIN ASPART 100 UNIT/ML ~~LOC~~ SOLN
0.0000 [IU] | Freq: Three times a day (TID) | SUBCUTANEOUS | Status: DC
Start: 2015-01-04 — End: 2015-01-06
  Administered 2015-01-04 – 2015-01-05 (×2): 7 [IU] via SUBCUTANEOUS
  Administered 2015-01-05 (×2): 4 [IU] via SUBCUTANEOUS
  Administered 2015-01-06: 7 [IU] via SUBCUTANEOUS

## 2015-01-04 MED ORDER — DEXAMETHASONE SODIUM PHOSPHATE 10 MG/ML IJ SOLN
INTRAMUSCULAR | Status: AC
  Filled 2015-01-04: qty 1

## 2015-01-04 MED ORDER — HYDROMORPHONE HCL 1 MG/ML IJ SOLN
0.2500 mg | INTRAMUSCULAR | Status: DC | PRN
  Administered 2015-01-04 (×2): 0.5 mg via INTRAVENOUS

## 2015-01-04 MED ORDER — ROCURONIUM BROMIDE 100 MG/10ML IV SOLN
INTRAVENOUS | Status: AC
  Filled 2015-01-04: qty 1

## 2015-01-04 MED ORDER — PANTOPRAZOLE SODIUM 40 MG IV SOLR
40.0000 mg | INTRAVENOUS | Status: DC
  Administered 2015-01-04 – 2015-01-05 (×2): 40 mg via INTRAVENOUS
  Filled 2015-01-04 (×3): qty 40

## 2015-01-04 MED ORDER — AMLODIPINE BESYLATE 10 MG PO TABS
10.0000 mg | ORAL_TABLET | Freq: Every morning | ORAL | Status: DC
  Administered 2015-01-05 – 2015-01-06 (×2): 10 mg via ORAL
  Filled 2015-01-04 (×2): qty 1

## 2015-01-04 MED ORDER — DEXAMETHASONE SODIUM PHOSPHATE 10 MG/ML IJ SOLN
INTRAMUSCULAR | Status: DC | PRN
  Administered 2015-01-04: 10 mg via INTRAVENOUS

## 2015-01-04 MED ORDER — MIDAZOLAM HCL 2 MG/2ML IJ SOLN
INTRAMUSCULAR | Status: AC
  Filled 2015-01-04: qty 2

## 2015-01-04 MED ORDER — LIDOCAINE HCL (CARDIAC) 20 MG/ML IV SOLN
INTRAVENOUS | Status: DC | PRN
  Administered 2015-01-04: 75 mg via INTRAVENOUS
  Administered 2015-01-04: 25 mg via INTRATRACHEAL

## 2015-01-04 MED ORDER — POTASSIUM CHLORIDE IN NACL 20-0.45 MEQ/L-% IV SOLN
INTRAVENOUS | Status: DC
  Administered 2015-01-04 – 2015-01-06 (×3): via INTRAVENOUS
  Filled 2015-01-04 (×5): qty 1000

## 2015-01-04 MED ORDER — PHENYLEPHRINE HCL 10 MG/ML IJ SOLN
INTRAMUSCULAR | Status: DC | PRN
  Administered 2015-01-04: 40 ug via INTRAVENOUS
  Administered 2015-01-04 (×2): 80 ug via INTRAVENOUS
  Administered 2015-01-04: 160 ug via INTRAVENOUS
  Administered 2015-01-04: 40 ug via INTRAVENOUS

## 2015-01-04 MED ORDER — ONDANSETRON HCL 4 MG PO TABS: 4.0000 mg | ORAL_TABLET | Freq: Four times a day (QID) | ORAL | Status: DC | PRN

## 2015-01-04 MED ORDER — GLYCOPYRROLATE 0.2 MG/ML IJ SOLN
INTRAMUSCULAR | Status: DC | PRN
  Administered 2015-01-04: .8 mg via INTRAVENOUS

## 2015-01-04 MED ORDER — OXYCODONE-ACETAMINOPHEN 5-325 MG PO TABS
1.0000 | ORAL_TABLET | ORAL | Status: DC | PRN
  Administered 2015-01-04 – 2015-01-05 (×3): 1 via ORAL
  Administered 2015-01-06 (×2): 2 via ORAL
  Filled 2015-01-04 (×2): qty 1
  Filled 2015-01-04 (×2): qty 2
  Filled 2015-01-04: qty 1

## 2015-01-04 MED ORDER — SUCCINYLCHOLINE CHLORIDE 20 MG/ML IJ SOLN: INTRAMUSCULAR | Status: DC | PRN

## 2015-01-04 SURGICAL SUPPLY — 51 items
ADH SKN CLS APL DERMABOND .7 (GAUZE/BANDAGES/DRESSINGS) ×1
APPLIER CLIP 5 13 M/L LIGAMAX5 (MISCELLANEOUS) ×6
APPLIER CLIP ROT 10 11.4 M/L (STAPLE)
APR CLP MED LRG 11.4X10 (STAPLE)
APR CLP MED LRG 5 ANG JAW (MISCELLANEOUS) ×2
BAG SPEC RTRVL 10 TROC 200 (ENDOMECHANICALS) ×1
BAG SPEC RTRVL LRG 6X4 10 (ENDOMECHANICALS)
CABLE HIGH FREQUENCY MONO STRZ (ELECTRODE) ×2 IMPLANT
CATH ROBINSON RED A/P 16FR (CATHETERS) ×3 IMPLANT
CHLORAPREP W/TINT 26ML (MISCELLANEOUS) ×3 IMPLANT
CLIP APPLIE 5 13 M/L LIGAMAX5 (MISCELLANEOUS) IMPLANT
CLIP APPLIE ROT 10 11.4 M/L (STAPLE) IMPLANT
COVER MAYO STAND STRL (DRAPES) ×2 IMPLANT
COVER SURGICAL LIGHT HANDLE (MISCELLANEOUS) ×3 IMPLANT
DECANTER SPIKE VIAL GLASS SM (MISCELLANEOUS) IMPLANT
DERMABOND ADVANCED (GAUZE/BANDAGES/DRESSINGS) ×2
DERMABOND ADVANCED .7 DNX12 (GAUZE/BANDAGES/DRESSINGS) ×1 IMPLANT
DEVICE TROCAR PUNCTURE CLOSURE (ENDOMECHANICALS) ×2 IMPLANT
DRAIN CHANNEL 19F RND (DRAIN) ×3 IMPLANT
DRAPE C-ARM 42X120 X-RAY (DRAPES) ×2 IMPLANT
DRAPE LAPAROSCOPIC ABDOMINAL (DRAPES) ×3 IMPLANT
ELECT L-HOOK LAP 45CM DISP (ELECTROSURGICAL) ×3
ELECT PENCIL ROCKER SW 15FT (MISCELLANEOUS) ×2 IMPLANT
ELECT REM PT RETURN 9FT ADLT (ELECTROSURGICAL) ×3
ELECTRODE L-HOOK LAP 45CM DISP (ELECTROSURGICAL) IMPLANT
ELECTRODE REM PT RTRN 9FT ADLT (ELECTROSURGICAL) ×1 IMPLANT
EVACUATOR SILICONE 100CC (DRAIN) ×2 IMPLANT
GLOVE BIO SURGEON STRL SZ7.5 (GLOVE) ×3 IMPLANT
GLOVE BIOGEL M STRL SZ7.5 (GLOVE) IMPLANT
GLOVE BIOGEL PI IND STRL 7.0 (GLOVE) ×1 IMPLANT
GLOVE BIOGEL PI INDICATOR 7.0 (GLOVE) ×2
GOWN STRL REUS W/TWL XL LVL3 (GOWN DISPOSABLE) ×9 IMPLANT
HEMOSTAT SNOW SURGICEL 2X4 (HEMOSTASIS) ×4 IMPLANT
KIT BASIN OR (CUSTOM PROCEDURE TRAY) ×3 IMPLANT
NS IRRIG 1000ML POUR BTL (IV SOLUTION) ×3 IMPLANT
POUCH RETRIEVAL ECOSAC 10 (ENDOMECHANICALS) ×1 IMPLANT
POUCH RETRIEVAL ECOSAC 10MM (ENDOMECHANICALS) ×2
POUCH SPECIMEN RETRIEVAL 10MM (ENDOMECHANICALS) ×1 IMPLANT
SET CHOLANGIOGRAPH MIX (MISCELLANEOUS) IMPLANT
SET IRRIG TUBING LAPAROSCOPIC (IRRIGATION / IRRIGATOR) ×3 IMPLANT
SLEEVE XCEL OPT CAN 5 100 (ENDOMECHANICALS) ×6 IMPLANT
SPONGE DRAIN TRACH 4X4 STRL 2S (GAUZE/BANDAGES/DRESSINGS) ×2 IMPLANT
SUT ETHILON 2 0 PS N (SUTURE) ×3 IMPLANT
SUT MNCRL AB 4-0 PS2 18 (SUTURE) ×5 IMPLANT
SUT VICRYL 0 UR6 27IN ABS (SUTURE) ×2 IMPLANT
TOWEL OR 17X26 10 PK STRL BLUE (TOWEL DISPOSABLE) ×3 IMPLANT
TOWEL OR NON WOVEN STRL DISP B (DISPOSABLE) ×3 IMPLANT
TRAY LAPAROSCOPIC (CUSTOM PROCEDURE TRAY) ×3 IMPLANT
TROCAR BLADELESS OPT 5 100 (ENDOMECHANICALS) ×3 IMPLANT
TROCAR XCEL BLUNT TIP 100MML (ENDOMECHANICALS) ×3 IMPLANT
TROCAR XCEL NON-BLD 11X100MML (ENDOMECHANICALS) ×2 IMPLANT

## 2015-01-04 NOTE — Brief Op Note (Signed)
01/04/2015  2:38 PM  PATIENT:  Larry [name]  69 y.o. male  PRE-OPERATIVE DIAGNOSIS:  Chronic calculous cholecystitis  POST-OPERATIVE DIAGNOSIS:  same  PROCEDURE:  Procedure(s): LAPAROSCOPIC CHOLECYSTECTOMY WITH CHOLANGIOGRAM (N/A)  SURGEON:  Surgeon(s) and Role:    * Gaynelle AduEric Sonny Poth, MD - Primary  PHYSICIAN ASSISTANT:   ASSISTANTS: Blima RichJessica Mann, RNFA   ANESTHESIA:   general  EBL:  Total I/O In: 1000 [I.V.:1000] Out: 150 [Blood:150]  BLOOD ADMINISTERED:none  DRAINS: (19 fr) Jackson-Pratt drain(s) with closed bulb suction in the RUQ/gallbladder fossa (+SNOW placed in gallbladder bed)  LOCAL MEDICATIONS USED:  MARCAINE     SPECIMEN:  Source of Specimen:  gallbladder (ant wall), stones  DISPOSITION OF SPECIMEN:  PATHOLOGY  COUNTS:  YES  TOURNIQUET:  * No tourniquets in log *  DICTATION: .Other Dictation: Dictation Number [id number]  PLAN OF CARE: Admit for overnight observation  PATIENT DISPOSITION:  PACU - hemodynamically stable.   Delay start of Pharmacological VTE agent (>24hrs) due to surgical blood loss or risk of bleeding: possible -depending on AM hgb  Mary SellaEric M. Andrey CampanileWilson, MD, FACS General, Bariatric, & Minimally Invasive Surgery [facility], [place]

## 2015-01-04 NOTE — Transfer of Care (Signed)
Immediate Anesthesia Transfer of Care Note  Patient: Larry RombergKenneth R Sossamon  Procedure(s) Performed: Procedure(s): LAPAROSCOPIC CHOLECYSTECTOMY WITH CHOLANGIOGRAM (N/A)  Patient Location: PACU  Anesthesia Type:General  Level of Consciousness: awake, alert , oriented and patient cooperative  Airway & Oxygen Therapy: Patient Spontanous Breathing and Patient connected to face mask oxygen  Post-op Assessment: Report given to RN, Post -op Vital signs reviewed and stable and Patient moving all extremities X 4  Post vital signs: stable  Last Vitals:  Filed Vitals:   01/04/15 1452  BP: 120/66  Pulse: 94  Temp: 36.6 C  Resp: 15    Complications: No apparent anesthesia complications

## 2015-01-04 NOTE — Interval H&P Note (Signed)
History and Physical Interval Note:  01/04/2015 11:00 AM  Larry [name]  has presented today for surgery, with the diagnosis of Chronic Cholelithiasis  The various methods of treatment have been discussed with the patient and family. After consideration of risks, benefits and other options for treatment, the patient has consented to  Procedure(s): LAPAROSCOPIC CHOLECYSTECTOMY (N/A) as a surgical intervention .  The patient's history has been reviewed, patient examined, no change in status, stable for surgery.  I have reviewed the patient's chart and labs.  Questions were answered to the patient's satisfaction.    Mary SellaEric M. Andrey CampanileWilson, MD, FACS General, Bariatric, & Minimally Invasive Surgery [facility], [place]  [facility] HospitalWILSON,[name]

## 2015-01-04 NOTE — Anesthesia Postprocedure Evaluation (Signed)
  Anesthesia Post-op Note  Patient: Larry RombergKenneth R [name]  Procedure(s) Performed: Procedure(s): LAPAROSCOPIC CHOLECYSTECTOMY WITH CHOLANGIOGRAM (N/A)  Patient Location: PACU  Anesthesia Type:General  Level of Consciousness: awake, alert  and sedated  Airway and Oxygen Therapy: Patient Spontanous Breathing  Post-op Pain: mild  Post-op Assessment: Post-op Vital signs reviewed, Patient's Cardiovascular Status Stable and Pain level controlled              Post-op Vital Signs: stable  Last Vitals:  Filed Vitals:   01/04/15 1452  BP: 120/66  Pulse: 94  Temp: 36.6 C  Resp: 15    Complications: No apparent anesthesia complications

## 2015-01-04 NOTE — Progress Notes (Signed)
Patient has a cholecystectomy tube draining bile color fluid from gallbladder intact. He states that tube has been further out and draining clearer fluid  But as of yesterday it is further in and draining darker fluid.

## 2015-01-04 NOTE — Anesthesia Procedure Notes (Signed)
Procedure Name: Intubation Date/Time: 01/04/2015 11:17 AM Performed by: Illene SilverEVANS, Shabria Egley E Pre-anesthesia Checklist: Patient identified, Emergency Drugs available, Suction available and Patient being monitored Patient Re-evaluated:Patient Re-evaluated prior to inductionOxygen Delivery Method: Circle System Utilized Preoxygenation: Pre-oxygenation with 100% oxygen Intubation Type: IV induction Ventilation: Mask ventilation without difficulty Laryngoscope Size: Mac and 4 Grade View: Grade II Tube type: Oral Tube size: 7.5 mm Number of attempts: 1 Airway Equipment and Method: Stylet and Oral airway Placement Confirmation: ETT inserted through vocal cords under direct vision,  positive ETCO2 and breath sounds checked- equal and bilateral Secured at: 21 cm Tube secured with: Tape Dental Injury: Teeth and Oropharynx as per pre-operative assessment

## 2015-01-04 NOTE — H&P (View-Only) (Signed)
Larry [name]. [name] 11/12/2014 8:38 AM Location: [facility] Patient #: [id number] DOB: 06-26-1945 Married / Language: English / Race: White Male  History of Present Illness Larry [name] M. [name] MD; 11/12/2014 9:43 AM) Patient words: Recheck Gallbladder.  The patient is a 69 year old male who presents for evaluation of gall stones. He comes in for follow-up after being initially evaluated while an inpatient in the hospital for acute calculus cholecystitis. On admission to the hospital he was found to have sepsis. It was also unclear how long he been symptomatic. Moreover he had had a myocardial infarction about 2 years ago with what sounded to be a drug-eluting stent placed but never had cardiology follow-up per the patient. It was best felt that he would undergo cholecystostomy tube placement by interventional radiology which was done on April 20. He was discharged to home on April 25. He was evaluated by cardiology while an inpatient. He does have a history of paroxysmal atrial fibrillation and takes Eliquis. He states he is doing well. He reports normal energy level. He denies any chest pain or chest pressure. He denies any nausea, vomiting, constipation. The drain stopped draining fluid about a week and a half ago. He reports a good appetite. He denies any TIAs or amaurosis fugax. He has some occasional lower extremity edema in his left leg.   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 Hip Surgery Left. Knee  Surgery Left.  Diagnostic Studies History Larry Glance, [name]; 1/47/8295 6:21 AM) Colonoscopy 1-5 years ago  Allergies [name], [name]; 08/23/6576 4:69 AM) No Known Drug Allergies05/27/2016  Medication History Larry Glance, [name]; 12/15/5282 1:32 AM) OxyCODONE HCl (  Tablet, Oral as needed) Active. Clindamycin HCl (  Capsule, Oral) Active. Amoxicillin-Pot Clavulanate (875-125MG  Tablet, Oral) Active. Allopurinol (  Tablet, Oral) Active. Eliquis (  Tablet, Oral) Active. Lipitor (  Tablet, Oral) Active. Vitamin D (Cholecalciferol) (1000UNIT Tablet, Oral) Active. CeleXA (  Tablet, Oral daily) Active. Lasix (  Tablet, Oral daily) Active. Lantus SoloStar (100UNIT/ML Soln Pen-inj, Subcutaneous) Active. Magnesium Oxide (  Capsule, Oral daily) Active. MetFORMIN HCl (  Tablet, Oral daily) Active. Zofran ODT (  Tablet Disperse, Oral as needed) Active. Protonix (  Packet, Oral daily) Active. Medications Reconciled  Social History Larry Glance, [name]; 4/40/1027 2:53 AM) No alcohol use No caffeine use No drug use Tobacco use Never smoker.  Family History Larry Glance, [name]; 6/64/4034 7:42 AM) Diabetes Mellitus Daughter, Sister, Son.  Review of Systems [name]; 5/95/6387 5:64 AM) General Not Present- Appetite Loss, Chills, Fatigue, Fever, Night Sweats, Weight Gain and Weight Loss. Skin Not Present- Change in Wart/Mole, Dryness, Hives, Jaundice, New Lesions, Non-Healing Wounds, Rash and Ulcer. HEENT Not Present- Earache, Hearing Loss, Hoarseness, Nose Bleed, Oral Ulcers, Ringing in the Ears, Seasonal Allergies, Sinus Pain, Sore Throat, Visual Disturbances, Wears glasses/contact lenses and Yellow Eyes. Respiratory Not Present- Bloody sputum, Chronic Cough, Difficulty Breathing, Snoring and Wheezing. Breast Not Present- Breast Mass, Breast Pain, Nipple Discharge and Skin Changes. Cardiovascular Not Present- Chest Pain,  Difficulty Breathing Lying Down, Leg Cramps, Palpitations, Rapid Heart Rate, Shortness of Breath and Swelling of Extremities. Gastrointestinal Not Present- Abdominal Pain, Bloating, Bloody Stool, Change in Bowel  Habits, Chronic diarrhea, Constipation, Difficulty Swallowing, Excessive gas, Gets full quickly at meals, Hemorrhoids, Indigestion, Nausea, Rectal Pain and Vomiting. Male Genitourinary Not Present- Blood in Urine, Change in Urinary Stream, Frequency, Impotence, Nocturia, Painful Urination, Urgency and Urine Leakage. Musculoskeletal Present- Back Pain. Not Present- Joint Pain, Joint Stiffness, Muscle Pain, Muscle Weakness and Swelling of Extremities. Neurological Not Present- Decreased Memory, Fainting, Headaches, Numbness, Seizures, Tingling, Tremor, Trouble walking and Weakness. Psychiatric Not Present- Anxiety, Bipolar, Change in Sleep Pattern, Depression, Fearful and Frequent crying. Hematology Not Present- Easy Bruising, Excessive bleeding, Gland problems, HIV and Persistent Infections.   Vitals Huntley Dec[facility] [name]; 7/82/95625/27/2016 1:308:39 AM) 11/12/2014 8:39 AM Weight: 256.13 lb Height: 67in Body Surface Area: 2.34 m Body Mass Index: 40.11 kg/m Temp.: 98.69F(Oral)  Pulse: 78 (Regular)  BP: 150/80 (Sitting, Left Arm, Standard)    Physical Exam Larry [name](Betta Balla M. Amberlin Utke MD; 11/12/2014 9:38 AM) General Mental Status-Alert. General Appearance-Consistent with stated age. Hydration-Well hydrated. Voice-Normal. Note: morbidly obese   Head and Neck Head-normocephalic, atraumatic with no lesions or palpable masses. Trachea-midline. Thyroid Gland Characteristics - normal size and consistency.  Eye Eyeball - Bilateral-Extraocular movements intact. Sclera/Conjunctiva - Bilateral-No scleral icterus.  Chest and Lung Exam Chest and lung exam reveals -quiet, even and easy respiratory effort with no use of accessory muscles and on auscultation, normal breath sounds, no  adventitious sounds and normal vocal resonance. Inspection Chest Wall - Normal. Back - normal.  Breast - Did not examine.  Cardiovascular Cardiovascular examination reveals -normal heart sounds, regular rate and rhythm with no murmurs and normal pedal pulses bilaterally.  Abdomen Inspection Inspection of the abdomen reveals - No Hernias. Skin - Scar - no surgical scars. Palpation/Percussion Palpation and Percussion of the abdomen reveal - Soft, Non Tender, No Rebound tenderness, No Rigidity (guarding) and No hepatosplenomegaly. Auscultation Auscultation of the abdomen reveals - Bowel sounds normal. Note: GB drain - high RUQ. skin intact.   Peripheral Vascular Upper Extremity Palpation - Pulses bilaterally normal.  Neurologic Neurologic evaluation reveals -alert and oriented x 3 with no impairment of recent or remote memory. Mental Status-Normal.  Neuropsychiatric The patient's mood and affect are described as -normal. Judgment and Insight-insight is appropriate concerning matters relevant to self.  Musculoskeletal Normal Exam - Left-Upper Extremity Strength Normal and Lower Extremity Strength Normal. Normal Exam - Right-Upper Extremity Strength Normal and Lower Extremity Strength Normal.  Lymphatic Head & Neck  General Head & Neck Lymphatics: Bilateral - Description - Normal. Axillary - Did not examine. Femoral & Inguinal - Did not examine.    Assessment & Plan Larry [name](Marinda Tyer M. Quintavious Rinck MD; 11/12/2014 9:45 AM) CHRONIC CHOLECYSTITIS WITH CALCULUS (574.10  K80.10) Impression: I explained to the patient and his wife that we first needed to get a cholangiogram through his gallbladder drain to see if his cystic duct is patent. If his cystic duct is not patent then I would deftly recommend cholecystectomy since his chance of recurrent cholecystitis would be very high. If his cystic duct is patent we did discuss removal of the drain versus proceeding with cholecystectomy.  We discussed the possibility of recurrent cholecystitis. Given the fact that he has cholelithiasis I believe his chance of recurrent cholecystitis in the future is greater than 50%. Therefore they have decided to proceed with cholecystectomy. We will need to get cardiology clearance as well. We will also need to get permission to hold his Eliquis around the time of surgery  I believe the patient's symptoms are consistent with gallbladder disease.  We discussed gallbladder disease.  The patient was given Agricultural engineer. We discussed non-operative and operative management. We discussed the signs & symptoms of acute cholecystitis  I discussed laparoscopic cholecystectomy with IOC in detail with possible open. The patient was given educational material as well as diagrams detailing the procedure. We discussed the risks and benefits of a laparoscopic cholecystectomy including, but not limited to bleeding, infection, injury to surrounding structures such as the intestine or liver, bile leak, retained gallstones, need to convert to an open procedure, prolonged diarrhea, blood clots such as DVT, common bile duct injury, anesthesia risks, and possible need for additional procedures. We discussed the typical post-operative recovery course. I explained that the likelihood of improvement of their symptoms is good. Current Plans  Pt Education - Pamphlet Given - Laparoscopic Gallbladder Surgery: discussed with patient and provided information. Pt Education - [facility] Free Text Education/Instructions: discussed with patient and provided information. 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 [name], MD, FACS General, Bariatric, & Minimally Invasive  [facility], [place]

## 2015-01-04 NOTE — Progress Notes (Signed)
CBG on arrival to PACU= 250; Dr. Jacklynn BueMassagee, anesthes, made aware; order rec'd and med given

## 2015-01-04 NOTE — Progress Notes (Signed)
Attempted to work with pt on flutter valve per md rx.  Pt tried it x2 but stated his throat was too sore from surgery and his belly hurt when it made him cough.  Pt was explained the importance of using the flutter and IS and encouraged to use them both q1h-q2hwa.  Pt verbalized understanding and had no questions at this time.

## 2015-01-04 NOTE — Anesthesia Preprocedure Evaluation (Signed)
Anesthesia Evaluation  Patient identified by MRN, date of birth, ID band Patient awake    Reviewed: Allergy & Precautions, NPO status , Patient's Chart, lab work & pertinent test results  History of Anesthesia Complications Negative for: history of anesthetic complications  Airway Mallampati: II  TM Distance: >3 FB Neck ROM: Full    Dental  (+) Teeth Intact   Pulmonary shortness of breath,  breath sounds clear to auscultation        Cardiovascular hypertension, + CAD and + Past MI Rhythm:Regular Rate:Normal     Neuro/Psych negative neurological ROS     GI/Hepatic negative GI ROS,   Endo/Other  diabetes, Well Controlled, Type 1, Insulin DependentMorbid obesity  Renal/GU      Musculoskeletal   Abdominal (+) + obese,   Peds  Hematology negative hematology ROS (+)   Anesthesia Other Findings   Reproductive/Obstetrics                             Anesthesia Physical Anesthesia Plan  ASA: III  Anesthesia Plan: General   Post-op Pain Management:    Induction: Intravenous  Airway Management Planned: Oral ETT  Additional Equipment:   Intra-op Plan:   Post-operative Plan: Extubation in OR  Informed Consent: I have reviewed the patients History and Physical, chart, labs and discussed the procedure including the risks, benefits and alternatives for the proposed anesthesia with the patient or authorized representative who has indicated his/her understanding and acceptance.   Dental advisory given  Plan Discussed with: CRNA and Surgeon  Anesthesia Plan Comments:         Anesthesia Quick Evaluation

## 2015-01-05 ENCOUNTER — Encounter (HOSPITAL_COMMUNITY): Payer: Self-pay | Admitting: General Surgery

## 2015-01-05 DIAGNOSIS — K8010 Calculus of gallbladder with chronic cholecystitis without obstruction: Secondary | ICD-10-CM | POA: Diagnosis not present

## 2015-01-05 LAB — CBC
HCT: 36.8 % — ABNORMAL LOW (ref 39.0–52.0)
Hemoglobin: 12.2 g/dL — ABNORMAL LOW (ref 13.0–17.0)
MCH: 31 pg (ref 26.0–34.0)
MCHC: 33.2 g/dL (ref 30.0–36.0)
MCV: 93.6 fL (ref 78.0–100.0)
Platelets: 220 10*3/uL (ref 150–400)
RBC: 3.93 MIL/uL — ABNORMAL LOW (ref 4.22–5.81)
RDW: 15.1 % (ref 11.5–15.5)
WBC: 14.2 10*3/uL — ABNORMAL HIGH (ref 4.0–10.5)

## 2015-01-05 LAB — BASIC METABOLIC PANEL WITH GFR
Anion gap: 7 (ref 5–15)
BUN: 20 mg/dL (ref 6–20)
CO2: 24 mmol/L (ref 22–32)
Calcium: 8.4 mg/dL — ABNORMAL LOW (ref 8.9–10.3)
Chloride: 105 mmol/L (ref 101–111)
Creatinine, Ser: 1.17 mg/dL (ref 0.61–1.24)
GFR calc Af Amer: >60 mL/min
GFR calc non Af Amer: >60 mL/min
Glucose, Bld: 218 mg/dL — ABNORMAL HIGH (ref 65–99)
Potassium: 4.9 mmol/L (ref 3.5–5.1)
Sodium: 136 mmol/L (ref 135–145)

## 2015-01-05 LAB — GLUCOSE, CAPILLARY
GLUCOSE-CAPILLARY: 202 mg/dL — AB (ref 65–99)
Glucose-Capillary: 191 mg/dL — ABNORMAL HIGH (ref 65–99)
Glucose-Capillary: 168 mg/dL — ABNORMAL HIGH (ref 65–99)
Glucose-Capillary: 210 mg/dL — ABNORMAL HIGH (ref 65–99)

## 2015-01-05 MED ORDER — INSULIN GLARGINE 100 UNIT/ML ~~LOC~~ SOLN
10.0000 [IU] | Freq: Every day | SUBCUTANEOUS | Status: DC
  Administered 2015-01-05 – 2015-01-06 (×2): 10 [IU] via SUBCUTANEOUS
  Filled 2015-01-05 (×2): qty 0.1

## 2015-01-05 MED ORDER — OXYCODONE-ACETAMINOPHEN 5-325 MG PO TABS: 1.0000 | ORAL_TABLET | ORAL | Status: DC | PRN

## 2015-01-05 NOTE — Care Management Note (Signed)
69 y/o Case Management Note  Patient Details  Name: Larry [name] MRN: [id number] Date of Birth: [birth year]  Subjective/Objective: 69 y/o m admitted w/abd pain.From home.                   Action/Plan:d/c plan home.   Expected Discharge Date:                  Expected Discharge Plan:  Home/Self Care  In-House Referral:     Discharge planning Services  CM Consult  Post Acute Care Choice:    Choice offered to:     DME Arranged:    DME Agency:     HH Arranged:    HH Agency:     Status of Service:  In process, will continue to follow  Medicare Important Message Given:    Date Medicare IM Given:    Medicare IM give by:    Date Additional Medicare IM Given:    Additional Medicare Important Message give by:     If discussed at Long Length of Stay Meetings, dates discussed:    Additional Comments:  Lanier ClamMahabir, Tamu Golz, RN 01/05/2015, 3:20 PM

## 2015-01-05 NOTE — Progress Notes (Signed)
Nutrition Brief Note  Patient identified on the Malnutrition Screening Tool (MST) Report.  Pt POD #1 laparoscopic cholecystectomy with cholangiogram. He ate 50% of potatoes and omelet for breakfast. Pt reports very good appetite now and PTA and he intentionally only ate 50% of breakfast as he is trying to lose weight. He states that he lost ~15 lbs in the past few months (confirmed by weight hx review showing 14 lb weight loss in the past 3 months). He states this weight loss was intentional and he is hopeful to continue to lose weight after d/c.  Wt Readings from Last 15 Encounters:  01/04/15 250 lb 8 oz (113.626 kg)  12/31/14 255 lb 8 oz (115.894 kg)  12/16/14 253 lb (114.76 kg)  10/08/14 264 lb 1.8 oz (119.8 kg)  10/01/14 266 lb (120.657 kg)    Body mass index is 39.22 kg/(m^2). Patient meets criteria for obesity based on current BMI.   Current diet order is Carb Modified, patient is consuming approximately 50% of meals at this time. Labs and medications reviewed.   No nutrition interventions warranted at this time. If nutrition issues arise, please consult RD.      Trenton GammonJessica Jody Silas, RD, LDN Inpatient Clinical Dietitian Pager # [phone] After hours/weekend pager # [phone]

## 2015-01-05 NOTE — Progress Notes (Signed)
1 Day Post-Op  Subjective: Pt voided. Had some discomfort in abd/rt side when doing IS. Has some rt mid abd discomfort. No n/v on liquids  Objective: Vital signs in last 24 hours: Temp:  [97.5 F (36.4 C)-98 F (36.7 C)] 97.8 F (36.6 C) (07/20 0505) Pulse Rate:  [63-100] 83 (07/20 1119) Resp:  [14-25] 20 (07/20 0505) BP: (108-156)/(58-92) 127/58 mmHg (07/20 0923) SpO2:  [92 %-99 %] 96 % (07/20 1119) Last BM Date: 01/04/15  Intake/Output from previous day: 07/19 0701 - 07/20 0700 In: 3454 [P.O.:444; I.V.:2810] Out: 540 [Urine:300; Drains:90; Blood:150] Intake/Output this shift: Total I/O In: 120 [P.O.:120] Out: 190 [Urine:150; Drains:40]  Alert, nad, nontoxic cta b/l Reg Soft, obese, expected mild TTP; incisions c/d/i; JP -sangous  Lab Results:   Recent Labs  01/05/15 0416  WBC 14.2*  HGB 12.2*  HCT 36.8*  PLT 220   BMET  Recent Labs  01/05/15 0416  NA 136  K 4.9  CL 105  CO2 24  GLUCOSE 218*  BUN 20  CREATININE 1.17  CALCIUM 8.4*   PT/INR No results for input(s): LABPROT, INR in the last 72 hours. ABG No results for input(s): PHART, HCO3 in the last 72 hours.  Invalid input(s): PCO2, PO2  Studies/Results: Dg Cholangiogram Operative  01/04/2015   CLINICAL DATA:  Chronic cholelithiasis  EXAM: INTRAOPERATIVE CHOLANGIOGRAM  TECHNIQUE: Cholangiographic images from the C-arm fluoroscopic device were submitted for interpretation post-operatively. Please see the procedural report for the amount of contrast and the fluoroscopy time utilized.  COMPARISON:  Ultrasound 10/05/2014  FINDINGS: No persistent filling defects in the common duct. Intrahepatic ducts are incompletely visualized, appearing decompressed centrally. Contrast passes into the duodenum. A severed cholecystostomy catheter is noted.  : Negative for retained common duct stone.   Electronically Signed   By: Corlis Leak  Hassell M.D.   On: 01/04/2015 14:10    Anti-infectives: Anti-infectives    Start      Dose/Rate Route Frequency Ordered Stop   01/04/15 0914  cefOXitin (MEFOXIN) 2 g in dextrose 5 % 50 mL IVPB     2 g 100 mL/hr over 30 Minutes Intravenous On call to O.R. 01/04/15 0914 01/04/15 1332      Assessment/Plan: s/p Procedure(s): LAPAROSCOPIC CHOLECYSTECTOMY WITH CHOLANGIOGRAM (N/A) IDDM HTN CAD PAF  High blood sugars - cont SSI, add lantus HTN - blood pressure ok PAF - in sinus; will cont to hold eliquis for now given intraop course and high risk of bleeding Ok to start chemical vte prophalysis IS about 1500.   Looks good. Will adv diet. Hasn't really ambulated out of room yet Discussed dc instructions Will touch base with nurse later today - if pt doing well, ambulating, tolerating diet, will let go home.   Mary SellaEric M. Andrey CampanileWilson, MD, FACS General, Bariatric, & Minimally Invasive Surgery [facility], [place]       [facility] Surgery CenterWILSON,Brandace Cargle Judie PetitM 01/05/2015

## 2015-01-05 NOTE — Op Note (Signed)
NAMEACY, ORSAK NO.:  [id number]  MEDICAL RECORD NO.:  [id number]  LOCATION:  84                         FACILITY:  [facility]  PHYSICIAN:  Leighton Ruff. Redmond Pulling, MD, FACSDATE OF BIRTH:  [birth year]  DATE OF PROCEDURE:  01/04/2015 DATE OF DISCHARGE:                              OPERATIVE REPORT   PREOPERATIVE DIAGNOSIS:  Chronic calculus cholecystitis.  POSTOPERATIVE DIAGNOSIS:  Chronic calculus cholecystitis.  PROCEDURE:  Laparoscopic cholecystectomy with cholangiogram.  SURGEON:  Leighton Ruff. Redmond Pulling, MD  ASSISTANT:  Servando Snare, RNFA  ANESTHESIA:  General.  ESTIMATED BLOOD LOSS:  150.  DRAINS:  A 19-French Jackson-Pratt in the right upper quadrant and the gallbladder fossa.  SPECIMEN:  Gallbladder, primarily anterior wall and stones.  INDICATIONS FOR PROCEDURE:  The patient is a pleasant 69 year old gentleman who I initially met about 3 months ago in early April when he presented with a multiple day history of right upper quadrant pain.  On admission to the hospital, he was on oral blood thinner, found to be in SIRS condition along with a bump in his creatinine.  Moreover, he has not seen a cardiologist since his cardiac cath 2 years prior.  Because of his multiple unstable comorbidities, I felt he would be best managed initially by percutaneous cholecystostomy tube.  He recovered from that quite well, and he was discharged from the hospital over the past 8 weeks or so.  His appetite and energy level have returned to normal.  He has been evaluated by the cardiologist and cleared for surgery.  Because of his having gallstones, I felt that he was at high risk for recurrent calculus cholecystitis.  The patient did not want to take the risk of recurrent cholecystitis, so he wanted to proceed with interval cholecystectomy.  I had a prolonged conversation with him and his wife regarding risks and benefits of surgery including, but not limited to, bleeding,  infection, injury to surrounding structures, need to convert to an open procedure, bile leak, prolonged hospitalization, irregular heart beats, blood clot formation, retained gallstones, spilled gallstones, perioperative cardiac and pulmonary issues as well as typical recovery course.  He elected to proceed with the surgery.  DESCRIPTION OF PROCEDURE:  He was taken to the OR for at [facility], placed supine on the operating room table.  General endotracheal anesthesia was established.  His abdomen was prepped and draped in the usual standard surgical fashion along with his cholecystostomy tube.  The cholecystostomy tube was draped outside of the field.  He received IV antibiotics prior to skin incision.  A surgical time-out was performed.  Local was infiltrated in the supraumbilical position.  Next, a small vertical supraumbilical incision was made.  The fascia was grasped and lifted anteriorly, and the fascia was incised.  A pursestring suture was placed around the fascial edges with 0 Vicryl on the UR6 needle.  The 12 mm Hasson trocar was placed and pneumoperitoneum was smoothly established.  The patient was placed reverse Trendelenburg, and a 5-mm trocar was placed in the epigastric position and two 5 mm trocars were placed in the right abdomen under direct visualization after local had been infiltrated.  He had some thin avascular  adhesions from the dome of his right lobe and delivered to the anterior abdominal wall, which were taken down.  His cholecystostomy tube was going through the middle of the right lobe down into the gallbladder.  I initially left that intact.  There was some omentum around the gallbladder.  Gallbladder was contracted.  Using hook electrocautery, I freed the omentum from the gallbladder.  The dome of the gallbladder was grasped and retracted to the right shoulder.  The patient had a very tight falciform on his liver capsule, and there was some  bleeding from the liver capsule.  There was not any significant bleeding and was left alone for now.  However, I did mobilize some of the falciform off the liver capsule to prevent any additional friction in this area.  There was some omentum medial, which was gently stripped away with the suction irrigator catheter.  I then spent the next 30-45 minutes using a Clinical research associate as well as suction irrigator catheter, teasing out the cystic duct and cystic artery.  I identified the node of Calot.  The infundibulum was able to be further mobilized and lifted up laterally. There was a thin peritoneal structure.  It did not appear to be the cystic duct, but ended up clipping it proximally, however, when I cut it distally, there was no drainage of bile from the gallbladder.  This appeared to just be a dense inflammatory rind.  I identified an anterior branch of the cystic artery going into the node of Calot,  I  decided to circumferentially dissect this out around.  It was clearly going into the lymph node and had a pulse to it.  This was taken with 2 clips proximally, 1 distally at the end of node and then transected with Endo Shears.  I was able to circumferentially dissect out and around the tubular structure, which appeared to be the cystic duct.  I also identified a posterior branch of the cystic artery, which was also circumferentially dissected out around.  I had put a few clips on some of the thick inflammatory rind to minimize bleeding.  I was able to achieve a large critical view.  A clip was placed across the cystic duct as it entered the gallbladder.  I then made a ductotomy.  There was free flow of bile.  A Cook cholangiogram catheter was introduced through the abdominal wall and down into the cystic duct.  It was secured with a clip.  We then performed a cholangiogram.  It showed prompt passage of contrast from the cystic duct, common hepatic, left and right ducts, as well as  common bile duct into the bowel.  The caliber did change in the distal common bile duct; however, it did not appear to be flow limiting. There were no filling defect.  Pneumoperitoneum was re-established.  The clip securing the cholangiogram catheter was removed along with the angiogram catheter.  Two clips were placed on the biliary aspect of the cystic duct and then transected above that.  The posterior branch of the cystic artery was clipped and then ligated distally.  I then started trying to roll the gallbladder up at the gallbladder fossa.  The posterior wall was essentially fused to the liver.  I ended up getting into the gallbladder.  He did not really have any tiny gallstones, they were more large.  However, I did upsize the epigastric trocar to an 11 trocar and manually extracted the spilled stones.  I was unable to get behind  and get back behind the posterior wall of the gallbladder.  It was simply densely adherent to the liver.  I felt the best thing to do was continue to just remove the anterior wall and leave the posterior wall behind.  I identified the drain within the gallbladder.  There was also a very large gallstone of at least 3-4 cm that spill, that I was able to place to the side to put it back later.  I manually extracted each stone as I came across it.  I eventually freed the anterior wall of the gallbladder off the liver.  Using an EcoSac, I placed it through this epigastric trocar and placed the gallbladder, the cholecystostomy tube that I ended up cutting above the liver surface and pulled it out through the liver into the bag along with the large gallstone.  I then placed the bag in the upper abdomen.  I then went about cauterizing the posterior wall of the gallbladder that remained.  I obtained hemostasis and apparently retracting the gallbladder towards the right shoulder, a rent had been made in the liver parenchyma.  It was not actively bleeding, however, I  did cauterize it.  The right upper quadrant was copiously irrigated until it was clear.  There was no evidence of any additional gallstones.  I then went about extracting the EcoSac that had the gallbladder and the gallstone and catheter in it.  Because of the size of the gallstone, I actually had to enlarge the fascial opening, but I was eventually able to extract the bag in its entirety.  Because I had to leave some of the posterior wall of the gallbladder behind, I did elect to leave a surgical drain in the right upper quadrant.  The drain was placed at the epigastric trocar and the end of the drain was brought out through the lateral right-sided trocar.  The drain was placed in the gallbladder fossa.  The drain was secured to the skin with 2-0 nylon.  I did place 2 pieces of surgical snow, 1 in the gallbladder fossa and 1 around the liver edge.  I then reapproximated the fascia in the subxiphoid position with an interrupted 0 Vicryl using an Endoclose.  I removed the Hasson trocar and tied down the previously placed pursestring suture, thus obliterating the fascial defect.  Our closure was viewed through the right midabdominal trocar.  There was no air leak.  There was nothing trapped within our closure.  The remaining trocar was removed.  The pneumoperitoneum was released.  The skin incisions were closed with 4-0 Monocryl and Dermabond.  A drain sponge was placed around the drain.  Because of the length of the procedure, the patient was in and out catheterized at the end of the procedure.  He was taken to the recovery room in stable condition.  There were no immediate complications.  The patient tolerated the procedure well.     Leighton Ruff. Redmond Pulling, MD, FACS     EMW/MEDQ  D:  01/04/2015  T:  01/04/2015  Job:  [id number]

## 2015-01-06 DIAGNOSIS — E669 Obesity, unspecified: Secondary | ICD-10-CM | POA: Diagnosis present

## 2015-01-06 DIAGNOSIS — Z7901 Long term (current) use of anticoagulants: Secondary | ICD-10-CM

## 2015-01-06 DIAGNOSIS — K8010 Calculus of gallbladder with chronic cholecystitis without obstruction: Secondary | ICD-10-CM | POA: Diagnosis not present

## 2015-01-06 LAB — CBC
HEMATOCRIT: 34.6 % — AB (ref 39.0–52.0)
Hemoglobin: 11.5 g/dL — ABNORMAL LOW (ref 13.0–17.0)
MCH: 30.6 pg (ref 26.0–34.0)
MCHC: 33.2 g/dL (ref 30.0–36.0)
MCV: 92 fL (ref 78.0–100.0)
Platelets: 199 10*3/uL (ref 150–400)
RBC: 3.76 MIL/uL — ABNORMAL LOW (ref 4.22–5.81)
RDW: 15.4 % (ref 11.5–15.5)
WBC: 11.3 10*3/uL — AB (ref 4.0–10.5)

## 2015-01-06 LAB — GLUCOSE, CAPILLARY: Glucose-Capillary: 220 mg/dL — ABNORMAL HIGH (ref 65–99)

## 2015-01-06 NOTE — Care Management Note (Signed)
Case Management Note  Patient Details  Name: Larry [name] MRN: [id number] Date of Birth: 05-Aug-1945  Subjective/Objective:                    Action/Plan:d/c home no needs or orders.   Expected Discharge Date:                  Expected Discharge Plan:  Home/Self Care  In-House Referral:     Discharge planning Services  CM Consult  Post Acute Care Choice:    Choice offered to:     DME Arranged:    DME Agency:     HH Arranged:    HH Agency:     Status of Service:  Completed, signed off  Medicare Important Message Given:    Date Medicare IM Given:    Medicare IM give by:    Date Additional Medicare IM Given:    Additional Medicare Important Message give by:     If discussed at Long Length of Stay Meetings, dates discussed:    Additional Comments:  Lanier Clam, RN 01/06/2015, 10:26 AM

## 2015-01-06 NOTE — Discharge Instructions (Signed)
[facility] LAPAROSCOPIC SURGERY: POST OP INSTRUCTIONS Always review your discharge instruction sheet given to you by the facility where your surgery was performed. IF YOU HAVE DISABILITY OR FAMILY LEAVE FORMS, YOU MUST BRING THEM TO THE OFFICE FOR PROCESSING.   DO NOT GIVE THEM TO YOUR DOCTOR.  1. A prescription for pain medication may be given to you upon discharge.  Take your pain medication as prescribed, if needed.  If narcotic pain medicine is not needed, then you may take acetaminophen (Tylenol) or ibuprofen (Advil) as needed. 2. Take your usually prescribed medications unless otherwise directed. 3. If you need a refill on your pain medication, please contact your pharmacy.  They will contact our office to request authorization. Prescriptions will not be filled after 5pm or on week-ends. 4. You should follow a light diet the first few days after arrival home, such as soup and crackers, etc.  Be sure to include lots of fluids daily. 5. Most patients will experience some swelling and bruising in the area of the incisions.  Ice packs will help.  Swelling and bruising can take several days to resolve.  6. It is common to experience some constipation if taking pain medication after surgery.  Increasing fluid intake and taking a stool softener (such as Colace) will usually help or prevent this problem from occurring.  A mild laxative (Milk of Magnesia or Miralax) should be taken according to package instructions if there are no bowel movements after 48 hours. 7.  If your surgeon used skin glue on the incision, you may shower in 24 hours.  The glue will flake off over the next 2-3 weeks.  Any sutures or staples will be removed at the office during your follow-up visit. 8. ACTIVITIES:  You may resume regular (light) daily activities beginning the next day--such as daily self-care, walking, climbing stairs--gradually increasing activities as tolerated.  You may have sexual  intercourse when it is comfortable.  Refrain from any heavy lifting or straining until approved by your doctor. a. You may drive when you are no longer taking prescription pain medication, you can comfortably wear a seatbelt, and you can safely maneuver your car and apply brakes. 9. You should see your doctor in the office for a follow-up appointment approximately 2-3 weeks after your surgery.  Make sure that you call for this appointment within a day or two after you arrive home to insure a convenient appointment time. 10. OTHER INSTRUCTIONS:RESUME ELIQUIS ON THIS SATURDAY 11.  EMPTY DRAIN AS TAUGHT AND KEEP JOURNAL  WHEN TO CALL YOUR DOCTOR: 1. Fever over 101.0 2. Inability to urinate 3. Continued bleeding from incision. 4. Increased pain, redness, or drainage from the incision. 5. Increasing abdominal pain  The clinic staff is available to answer your questions during regular business hours.  Please dont hesitate to call and ask to speak to one of the nurses for clinical concerns.  If you have a medical emergency, go to the nearest emergency room or call 911.  A surgeon from [facility] is always on call at the hospital. [address], [address], [place], [place]  [address] ? [address] [address], [place], [place]   [address] [phone] ? [phone] ? FAX [phone] Web site: [email or web address]  Bulb Drain Home Care A bulb drain consists of a thin rubber tube and a soft, round bulb that creates a gentle suction. The rubber tube is placed in the area where you had surgery. A bulb is attached  to the end of the tube that is outside the body. The bulb drain removes excess fluid that normally builds up in a surgical wound after surgery. The color and amount of fluid will vary. Immediately after surgery, the fluid is bright red and is a little thicker than water. It may gradually change to a yellow or pink color and become more thin and water-like. When the amount  decreases to about 1 or 2 tbsp in 24 hours, your health care provider will usually remove it. DAILY CARE  Keep the bulb flat (compressed) at all times, except while emptying it. The flatness creates suction. You can flatten the bulb by squeezing it firmly in the middle and then closing the cap.  Keep sites where the tube enters the skin dry and covered with a bandage (dressing).  Secure the tube 1-2 in (2.5-5.1 cm) below the insertion sites to keep it from pulling on your stitches. The tube is stitched in place and will not slip out.  Secure the bulb as directed by your health care provider.  For the first 3 days after surgery, there usually is more fluid in the bulb. Empty the bulb whenever it becomes half full because the bulb does not create enough suction if it is too full. The bulb could also overflow. Write down how much fluid you remove each time you empty your drain. Add up the amount removed in 24 hours.  Empty the bulb at the same time every day once the amount of fluid decreases and you only need to empty it once a day. Write down the amounts and the 24-hour totals to give to your health care provider. This helps your health care provider know when the tubes can be removed. EMPTYING THE BULB DRAIN Before emptying the bulb, get a measuring cup, a piece of paper and a pen, and wash your hands.  Gently run your fingers down the tube (stripping) to empty any drainage from the tubing into the bulb. This may need to be done several times a day to clear the tubing of clots and tissue.  Open the bulb cap to release suction, which causes it to inflate. Do not touch the inside of the cap.  Gently run your fingers down the tube (stripping) to empty any drainage from the tubing into the bulb.  Hold the cap out of the way, and pour fluid into the measuring cup.   Squeeze the bulb to provide suction.  Replace the cap.   Check the tape that holds the tube to your skin. If it is becoming  loose, you can remove the loose piece of tape and apply a new one. Then, pin the bulb to your shirt.   Write down the amount of fluid you emptied out. Write down the date and each time you emptied your bulb drain. (If there are 2 bulbs, note the amount of drainage from each bulb and keep the totals separate. Your health care provider will want to know the total amounts for each drain and which tube is draining more.)   Flush the fluid down the toilet and wash your hands.   Call your health care provider once you have less than 2 tbsp of fluid collecting in the bulb drain every 24 hours. If there is drainage around the tube site, change dressings and keep the area dry. Cleanse around tube with sterile saline and place dry gauze around site. This gauze should be changed when it is soiled. If it stays  clean and unsoiled, it should still be changed daily.  SEEK MEDICAL CARE IF:  Your drainage has a bad smell or is cloudy.   You have a fever.   Your drainage is increasing instead of decreasing.   Your tube fell out.   You have redness or swelling around the tube site.   You have drainage from a surgical wound.   Your bulb drain will not stay flat after you empty it.  MAKE SURE YOU:   Understand these instructions.  Will watch your condition.  Will get help right away if you are not doing well or get worse. Document Released: 06/01/2000 Document Revised: 10/19/2013 Document Reviewed: 11/07/2011 [facility] Patient Information 2015 [place], [place]. This information is not intended to replace advice given to you by your health care provider. Make sure you discuss any questions you have with your health care provider.

## 2015-01-06 NOTE — Discharge Summary (Signed)
Physician Discharge Summary  Larry [name] ZOX:[id number] DOB: 12-16-1945 DOA: 01/04/2015  PCP: [name]  Admit date: 01/04/2015 Discharge date: 01/06/2015  Recommendations for Outpatient Follow-up:  1.   Follow-up Information    Follow up with [name], MD. Schedule an appointment as soon as possible for a visit on 02/03/2015.   Specialty:  General Surgery   Why:  2:45 PM (arrive 2:30pm) For wound re-check   Contact information:   [address] [place] [address] [phone]       Follow up with Heyat, Perviz. Schedule an appointment as soon as possible for a visit in 2 weeks.   Specialty:  Internal Medicine   Contact information:   [address] [place] [address] [phone]       Follow up with [facility] On 01/10/2015.   Specialty:  General Surgery   Why:  with NURSE for drain removal - office will call you with time   Contact information:   [address] [place] [address] [phone]      Discharge Diagnoses:  Principal Problem:   Chronic cholecystitis Active Problems:   Diabetes mellitus without complication   HTN (hypertension)   CAD (coronary artery disease)   Paroxysmal atrial fibrillation   Obesity   Anticoagulated  Surgical Procedure: laparoscopic cholecystectomy with cholangiogram  Discharge Condition: good Disposition: home  Diet recommendation: diabetic  Filed Weights   01/04/15 0917  Weight: 113.626 kg (250 lb 8 oz)    Hospital Course:  The patient was brought in for a planned interval cholecystectomy due to his chronic calculus cholecystitis.  He had presented to the hospital in April with acute cholecystitis, sepsis and underwent cholecystostomy tube placement.  He has recovered and was felt stable for surgery.  He underwent laparoscopic cholecystectomy on the day of admission.  A surgical drain was left to monitor for a postoperative bile leak.  There is also some bleeding during  surgery.  I kept the patient an extra day to monitor for any additional signs of bleeding since he would be resuming oral anticoagulation due to his history of paroxysmal atrial fibrillation.  He did quite well postoperatively.  He was ambulating.  He was tolerating a diet.  He was monitored on telemetry.  There is no signs of atrial fibrillation. He was getting his incentive spirometry up to around 2000. There is no evidence of bile leak in his surgical drain.  He had been instructed on drain care.  He was felt stable for discharge.  I discussed discharge instructions with the patient  The patient was instructed to resume his Eliquis on Saturday  BP 127/70 mmHg  Pulse 72  Temp(Src) 98.2 F (36.8 C) (Oral)  Resp 28  Ht 5\' 7"  (1.702 m)  Wt 113.626 kg (250 lb 8 oz)  BMI 39.22 kg/m2  SpO2 92%  Gen: alert, NAD, non-toxic appearing Pupils: equal, no scleral icterus Pulm: Lungs clear to auscultation, symmetric chest rise CV: regular rate and rhythm Abd: soft, mild expected tenderness, nondistended. Drain -serosang. No cellulitis. No incisional hernia Ext: no edema, no calf tenderness Skin: no rash, no jaundice, no edema    Discharge Instructions  Discharge Instructions    Call MD for:  difficulty breathing, headache or visual disturbances    Complete by:  As directed      Call MD for:  persistant nausea and vomiting    Complete by:  As directed      Call  MD for:  redness, tenderness, or signs of infection (pain, swelling, redness, odor or green/yellow discharge around incision site)    Complete by:  As directed      Call MD for:  severe uncontrolled pain    Complete by:  As directed      Call MD for:    Complete by:  As directed   Temp >101     Diet - low sodium heart healthy    Complete by:  As directed      Diet Carb Modified    Complete by:  As directed      Discharge instructions    Complete by:  As directed   RESUME ELIQUIS on Saturday See [facility] discharge instructions      Increase activity slowly    Complete by:  As directed             Medication List    STOP taking these medications        amoxicillin-clavulanate 875-125 MG per tablet  Commonly known as:  AUGMENTIN     clindamycin 150 MG capsule  Commonly known as:  CLEOCIN      TAKE these medications        allopurinol 300 MG tablet  Commonly known as:  ZYLOPRIM  Take 300 mg by mouth daily.     amLODipine 10 MG tablet  Commonly known as:  NORVASC  Take 10 mg by mouth every morning.     apixaban 5 MG Tabs tablet  Commonly known as:  ELIQUIS  Take 1 tablet (5 mg total) by mouth 2 (two) times daily.     aspirin 81 MG tablet  Take 81 mg by mouth daily.     atorvastatin 80 MG tablet  Commonly known as:  LIPITOR  Take 80 mg by mouth at bedtime.     citalopram 40 MG tablet  Commonly known as:  CELEXA  Take 40 mg by mouth every morning.     furosemide 40 MG tablet  Commonly known as:  LASIX  Take 40 mg by mouth every morning.     glucose 4 GM chewable tablet  Chew 1 tablet by mouth as needed for low blood sugar (only if BS IS BELOW 70).     insulin glargine 100 UNIT/ML injection  Commonly known as:  LANTUS  Inject 40 Units into the skin at bedtime.     Liraglutide 18 MG/3ML Sopn  Inject 1.2 mg into the skin daily. Pt states has been on hold since discharged 10-06-14 hospital visit     lisinopril 40 MG tablet  Commonly known as:  PRINIVIL,ZESTRIL  Take 40 mg by mouth 2 (two) times daily. On hold since 10-06-14     magnesium oxide 400 MG tablet  Commonly known as:  MAG-OX  Take 400 mg by mouth 2 (two) times daily.     metFORMIN 500 MG tablet  Commonly known as:  GLUCOPHAGE  Take 500 mg by mouth 2 (two) times daily with a meal.     ondansetron 8 MG tablet  Commonly known as:  ZOFRAN  Take 8 mg by mouth 2 (two) times daily. Not taking-on hold form 10-06-14     oxyCODONE-acetaminophen 5-325 MG per tablet  Commonly known as:  PERCOCET/ROXICET  Take 1-2 tablets by mouth every  4 (four) hours as needed for moderate pain.     pantoprazole 40 MG tablet  Commonly known as:  PROTONIX  Take 40 mg by mouth daily.  vitamin B-12 500 MCG tablet  Commonly known as:  CYANOCOBALAMIN  Take 500 mcg by mouth daily.     VITAMIN D PO  Take 2,000 Units by mouth daily.           Follow-up Information    Follow up with [name], MD. Schedule an appointment as soon as possible for a visit on 02/03/2015.   Specialty:  General Surgery   Why:  2:45 PM (arrive 2:30pm) For wound re-check   Contact information:   [address] [place] [address] [phone]       Follow up with Heyat, Perviz. Schedule an appointment as soon as possible for a visit in 2 weeks.   Specialty:  Internal Medicine   Contact information:   [address] [place] [address] [phone]       Follow up with [facility] On 01/10/2015.   Specialty:  General Surgery   Why:  with NURSE for drain removal - office will call you with time   Contact information:   [address] [place] [address] [phone]        The results of significant diagnostics from this hospitalization (including imaging, microbiology, ancillary and laboratory) are listed below for reference.    Significant Diagnostic Studies: Dg Cholangiogram Operative  01/04/2015   CLINICAL DATA:  Chronic cholelithiasis  EXAM: INTRAOPERATIVE CHOLANGIOGRAM  TECHNIQUE: Cholangiographic images from the C-arm fluoroscopic device were submitted for interpretation post-operatively. Please see the procedural report for the amount of contrast and the fluoroscopy time utilized.  COMPARISON:  Ultrasound 10/05/2014  FINDINGS: No persistent filling defects in the common duct. Intrahepatic ducts are incompletely visualized, appearing decompressed centrally. Contrast passes into the duodenum. A severed cholecystostomy catheter is noted.  : Negative for retained common duct stone.   Electronically  Signed   By: Corlis Leak M.D.   On: 01/04/2015 14:10   Ir Patient Eval Tech 0-60 Mins  12/21/2014   Jearld Lesch [facility]     12/21/2014  2:44 PM Pt came to the ER with complaint of leaking cholecystostomy tube  bag. I went to the ER to evaluate and exchange the bag.  At this  time the patient stated that the stiches were causing a lot of  discomfort and pain.  I offered to place a statlock as a suture  alternative.  The patient was happy with this option.  The stitch  was removed and the stat lock secured.  I gave the patient the  contact information for the Ir department and advised him if he  had any additional concerns with the tube or bag to call the  department directly.  He has a follow up appointment with his  physician on Thursday to discuss his surgery and if the tube  needs to be changed.   Microbiology: No results found for this or any previous visit (from the past 240 hour(s)).   Labs: Basic Metabolic Panel:  Recent Labs Lab 12/31/14 1340 01/05/15 0416  NA 140 136  K 4.7 4.9  CL 108 105  CO2 29 24  GLUCOSE 231* 218*  BUN 17 20  CREATININE 1.08 1.17  CALCIUM 8.8* 8.4*   Liver Function Tests:  Recent Labs Lab 12/31/14 1340  AST 35  ALT 26  ALKPHOS 84  BILITOT 1.3*  PROT 6.9  ALBUMIN 3.9   No results for input(s): LIPASE, AMYLASE in the last 168 hours. No results for input(s): AMMONIA in the last  168 hours. CBC:  Recent Labs Lab 12/31/14 1340 01/05/15 0416 01/06/15 0535  WBC 7.2 14.2* 11.3*  NEUTROABS 5.1  --   --   HGB 12.0* 12.2* 11.5*  HCT 37.1* 36.8* 34.6*  MCV 95.1 93.6 92.0  PLT 239 220 199   Cardiac Enzymes: No results for input(s): CKTOTAL, CKMB, CKMBINDEX, TROPONINI in the last 168 hours. BNP: BNP (last 3 results) No results for input(s): BNP in the last 8760 hours.  ProBNP (last 3 results) No results for input(s): PROBNP in the last 8760 hours.  CBG:  Recent Labs Lab 01/05/15 0746 01/05/15 1230 01/05/15 1649 01/05/15 2224 01/06/15 0746   GLUCAP 191* 202* 168* 210* 220*    Principal Problem:   Chronic cholecystitis Active Problems:   Diabetes mellitus without complication   HTN (hypertension)   CAD (coronary artery disease)   Paroxysmal atrial fibrillation   Obesity   Anticoagulated   Time coordinating discharge: 20 min  Signed:  [name], MD [facility] [place] [facility] Surgery, [place] [phone] 01/06/2015, 10:13 AM

## 2015-01-13 ENCOUNTER — Ambulatory Visit
Admission: RE | Admit: 2015-01-13 | Discharge: 2015-01-13 | Disposition: A | Discharge status: Home / Self Care | Payer: Medicare Other | Source: Ambulatory Visit | Attending: General Surgery | Admitting: General Surgery

## 2015-01-13 ENCOUNTER — Other Ambulatory Visit: Payer: Self-pay | Admitting: General Surgery

## 2015-01-13 DIAGNOSIS — R609 Edema, unspecified: Secondary | ICD-10-CM

## 2015-01-13 DIAGNOSIS — T8189XS Other complications of procedures, not elsewhere classified, sequela: Principal | ICD-10-CM

## 2015-01-13 DIAGNOSIS — T8189XA Other complications of procedures, not elsewhere classified, initial encounter: Secondary | ICD-10-CM

## 2015-01-13 MED ORDER — IOPAMIDOL (ISOVUE-300) INJECTION 61%
125.0000 mL | Freq: Once | INTRAVENOUS | Status: AC | PRN
  Administered 2015-01-13: 125 mL via INTRAVENOUS

## 2015-01-19 ENCOUNTER — Inpatient Hospital Stay (HOSPITAL_COMMUNITY)
Admission: EM | Admit: 2015-01-19 | Discharge: 2015-02-26 | DRG: 853 | Disposition: A | Discharge status: Skilled Nursing Facility | Payer: Medicare Other | Attending: Internal Medicine | Admitting: Internal Medicine

## 2015-01-19 ENCOUNTER — Emergency Department (HOSPITAL_COMMUNITY): Payer: Medicare Other

## 2015-01-19 ENCOUNTER — Encounter (HOSPITAL_COMMUNITY): Payer: Self-pay | Admitting: Emergency Medicine

## 2015-01-19 DIAGNOSIS — T8351XA Infection and inflammatory reaction due to indwelling urinary catheter, initial encounter: Secondary | ICD-10-CM | POA: Diagnosis not present

## 2015-01-19 DIAGNOSIS — K567 Ileus, unspecified: Secondary | ICD-10-CM | POA: Diagnosis not present

## 2015-01-19 DIAGNOSIS — Z79899 Other long term (current) drug therapy: Secondary | ICD-10-CM | POA: Diagnosis not present

## 2015-01-19 DIAGNOSIS — R188 Other ascites: Secondary | ICD-10-CM

## 2015-01-19 DIAGNOSIS — N189 Chronic kidney disease, unspecified: Secondary | ICD-10-CM

## 2015-01-19 DIAGNOSIS — Z6841 Body Mass Index (BMI) 40.0 and over, adult: Secondary | ICD-10-CM

## 2015-01-19 DIAGNOSIS — IMO0002 Reserved for concepts with insufficient information to code with codable children: Secondary | ICD-10-CM

## 2015-01-19 DIAGNOSIS — Z794 Long term (current) use of insulin: Secondary | ICD-10-CM

## 2015-01-19 DIAGNOSIS — E119 Type 2 diabetes mellitus without complications: Secondary | ICD-10-CM | POA: Diagnosis not present

## 2015-01-19 DIAGNOSIS — J90 Pleural effusion, not elsewhere classified: Secondary | ICD-10-CM | POA: Diagnosis not present

## 2015-01-19 DIAGNOSIS — B999 Unspecified infectious disease: Secondary | ICD-10-CM | POA: Diagnosis present

## 2015-01-19 DIAGNOSIS — R532 Functional quadriplegia: Secondary | ICD-10-CM | POA: Diagnosis not present

## 2015-01-19 DIAGNOSIS — R0602 Shortness of breath: Secondary | ICD-10-CM

## 2015-01-19 DIAGNOSIS — T501X5A Adverse effect of loop [high-ceiling] diuretics, initial encounter: Secondary | ICD-10-CM | POA: Diagnosis not present

## 2015-01-19 DIAGNOSIS — B961 Klebsiella pneumoniae [K. pneumoniae] as the cause of diseases classified elsewhere: Secondary | ICD-10-CM | POA: Diagnosis not present

## 2015-01-19 DIAGNOSIS — E669 Obesity, unspecified: Secondary | ICD-10-CM | POA: Diagnosis present

## 2015-01-19 DIAGNOSIS — R059 Cough, unspecified: Secondary | ICD-10-CM

## 2015-01-19 DIAGNOSIS — D62 Acute posthemorrhagic anemia: Secondary | ICD-10-CM | POA: Diagnosis present

## 2015-01-19 DIAGNOSIS — R1110 Vomiting, unspecified: Secondary | ICD-10-CM

## 2015-01-19 DIAGNOSIS — Y846 Urinary catheterization as the cause of abnormal reaction of the patient, or of later complication, without mention of misadventure at the time of the procedure: Secondary | ICD-10-CM | POA: Diagnosis present

## 2015-01-19 DIAGNOSIS — E785 Hyperlipidemia, unspecified: Secondary | ICD-10-CM | POA: Diagnosis not present

## 2015-01-19 DIAGNOSIS — E1121 Type 2 diabetes mellitus with diabetic nephropathy: Secondary | ICD-10-CM | POA: Diagnosis not present

## 2015-01-19 DIAGNOSIS — A419 Sepsis, unspecified organism: Secondary | ICD-10-CM

## 2015-01-19 DIAGNOSIS — M109 Gout, unspecified: Secondary | ICD-10-CM | POA: Diagnosis present

## 2015-01-19 DIAGNOSIS — IMO0001 Reserved for inherently not codable concepts without codable children: Secondary | ICD-10-CM

## 2015-01-19 DIAGNOSIS — N183 Chronic kidney disease, stage 3 (moderate): Secondary | ICD-10-CM | POA: Diagnosis present

## 2015-01-19 DIAGNOSIS — T83511A Infection and inflammatory reaction due to indwelling urethral catheter, initial encounter: Secondary | ICD-10-CM

## 2015-01-19 DIAGNOSIS — Z452 Encounter for adjustment and management of vascular access device: Secondary | ICD-10-CM

## 2015-01-19 DIAGNOSIS — E2740 Unspecified adrenocortical insufficiency: Secondary | ICD-10-CM | POA: Diagnosis not present

## 2015-01-19 DIAGNOSIS — Z8249 Family history of ischemic heart disease and other diseases of the circulatory system: Secondary | ICD-10-CM

## 2015-01-19 DIAGNOSIS — R0603 Acute respiratory distress: Secondary | ICD-10-CM

## 2015-01-19 DIAGNOSIS — J9601 Acute respiratory failure with hypoxia: Secondary | ICD-10-CM | POA: Diagnosis not present

## 2015-01-19 DIAGNOSIS — D638 Anemia in other chronic diseases classified elsewhere: Secondary | ICD-10-CM | POA: Diagnosis present

## 2015-01-19 DIAGNOSIS — Z515 Encounter for palliative care: Secondary | ICD-10-CM | POA: Diagnosis not present

## 2015-01-19 DIAGNOSIS — S3692XA Contusion of unspecified intra-abdominal organ, initial encounter: Secondary | ICD-10-CM

## 2015-01-19 DIAGNOSIS — B3749 Other urogenital candidiasis: Secondary | ICD-10-CM | POA: Diagnosis not present

## 2015-01-19 DIAGNOSIS — I480 Paroxysmal atrial fibrillation: Secondary | ICD-10-CM | POA: Diagnosis not present

## 2015-01-19 DIAGNOSIS — D649 Anemia, unspecified: Secondary | ICD-10-CM

## 2015-01-19 DIAGNOSIS — E1122 Type 2 diabetes mellitus with diabetic chronic kidney disease: Secondary | ICD-10-CM | POA: Diagnosis present

## 2015-01-19 DIAGNOSIS — Z4659 Encounter for fitting and adjustment of other gastrointestinal appliance and device: Secondary | ICD-10-CM

## 2015-01-19 DIAGNOSIS — R579 Shock, unspecified: Secondary | ICD-10-CM | POA: Diagnosis not present

## 2015-01-19 DIAGNOSIS — D72829 Elevated white blood cell count, unspecified: Secondary | ICD-10-CM | POA: Diagnosis present

## 2015-01-19 DIAGNOSIS — T888XXS Other specified complications of surgical and medical care, not elsewhere classified, sequela: Secondary | ICD-10-CM | POA: Diagnosis not present

## 2015-01-19 DIAGNOSIS — E875 Hyperkalemia: Secondary | ICD-10-CM | POA: Diagnosis not present

## 2015-01-19 DIAGNOSIS — I2510 Atherosclerotic heart disease of native coronary artery without angina pectoris: Secondary | ICD-10-CM | POA: Diagnosis present

## 2015-01-19 DIAGNOSIS — E876 Hypokalemia: Secondary | ICD-10-CM | POA: Diagnosis present

## 2015-01-19 DIAGNOSIS — K651 Peritoneal abscess: Secondary | ICD-10-CM | POA: Diagnosis not present

## 2015-01-19 DIAGNOSIS — E43 Unspecified severe protein-calorie malnutrition: Secondary | ICD-10-CM | POA: Diagnosis not present

## 2015-01-19 DIAGNOSIS — I9589 Other hypotension: Secondary | ICD-10-CM

## 2015-01-19 DIAGNOSIS — Y838 Other surgical procedures as the cause of abnormal reaction of the patient, or of later complication, without mention of misadventure at the time of the procedure: Secondary | ICD-10-CM | POA: Diagnosis present

## 2015-01-19 DIAGNOSIS — I482 Chronic atrial fibrillation: Secondary | ICD-10-CM | POA: Diagnosis not present

## 2015-01-19 DIAGNOSIS — R05 Cough: Secondary | ICD-10-CM

## 2015-01-19 DIAGNOSIS — B9689 Other specified bacterial agents as the cause of diseases classified elsewhere: Secondary | ICD-10-CM | POA: Diagnosis not present

## 2015-01-19 DIAGNOSIS — Z955 Presence of coronary angioplasty implant and graft: Secondary | ICD-10-CM

## 2015-01-19 DIAGNOSIS — I129 Hypertensive chronic kidney disease with stage 1 through stage 4 chronic kidney disease, or unspecified chronic kidney disease: Secondary | ICD-10-CM | POA: Diagnosis present

## 2015-01-19 DIAGNOSIS — N170 Acute kidney failure with tubular necrosis: Secondary | ICD-10-CM | POA: Diagnosis not present

## 2015-01-19 DIAGNOSIS — K91840 Postprocedural hemorrhage and hematoma of a digestive system organ or structure following a digestive system procedure: Secondary | ICD-10-CM | POA: Diagnosis not present

## 2015-01-19 DIAGNOSIS — Z833 Family history of diabetes mellitus: Secondary | ICD-10-CM | POA: Diagnosis not present

## 2015-01-19 DIAGNOSIS — G9340 Encephalopathy, unspecified: Secondary | ICD-10-CM | POA: Diagnosis not present

## 2015-01-19 DIAGNOSIS — Z7901 Long term (current) use of anticoagulants: Secondary | ICD-10-CM | POA: Diagnosis not present

## 2015-01-19 DIAGNOSIS — K7689 Other specified diseases of liver: Secondary | ICD-10-CM | POA: Diagnosis not present

## 2015-01-19 DIAGNOSIS — N179 Acute kidney failure, unspecified: Secondary | ICD-10-CM

## 2015-01-19 DIAGNOSIS — Z96641 Presence of right artificial hip joint: Secondary | ICD-10-CM | POA: Diagnosis present

## 2015-01-19 DIAGNOSIS — R52 Pain, unspecified: Secondary | ICD-10-CM

## 2015-01-19 DIAGNOSIS — Z9049 Acquired absence of other specified parts of digestive tract: Secondary | ICD-10-CM | POA: Diagnosis present

## 2015-01-19 DIAGNOSIS — T888XXD Other specified complications of surgical and medical care, not elsewhere classified, subsequent encounter: Secondary | ICD-10-CM | POA: Diagnosis not present

## 2015-01-19 DIAGNOSIS — N390 Urinary tract infection, site not specified: Secondary | ICD-10-CM | POA: Diagnosis present

## 2015-01-19 DIAGNOSIS — R000 Tachycardia, unspecified: Secondary | ICD-10-CM | POA: Diagnosis present

## 2015-01-19 DIAGNOSIS — R6521 Severe sepsis with septic shock: Secondary | ICD-10-CM | POA: Diagnosis present

## 2015-01-19 DIAGNOSIS — Z7982 Long term (current) use of aspirin: Secondary | ICD-10-CM | POA: Diagnosis not present

## 2015-01-19 DIAGNOSIS — I10 Essential (primary) hypertension: Secondary | ICD-10-CM | POA: Diagnosis present

## 2015-01-19 DIAGNOSIS — L304 Erythema intertrigo: Secondary | ICD-10-CM | POA: Diagnosis present

## 2015-01-19 DIAGNOSIS — T814XXS Infection following a procedure, sequela: Secondary | ICD-10-CM

## 2015-01-19 DIAGNOSIS — Z96652 Presence of left artificial knee joint: Secondary | ICD-10-CM | POA: Diagnosis present

## 2015-01-19 DIAGNOSIS — E1165 Type 2 diabetes mellitus with hyperglycemia: Secondary | ICD-10-CM | POA: Diagnosis present

## 2015-01-19 DIAGNOSIS — J80 Acute respiratory distress syndrome: Secondary | ICD-10-CM | POA: Diagnosis not present

## 2015-01-19 DIAGNOSIS — E1129 Type 2 diabetes mellitus with other diabetic kidney complication: Secondary | ICD-10-CM | POA: Diagnosis present

## 2015-01-19 DIAGNOSIS — K219 Gastro-esophageal reflux disease without esophagitis: Secondary | ICD-10-CM | POA: Diagnosis present

## 2015-01-19 DIAGNOSIS — N508 Other specified disorders of male genital organs: Secondary | ICD-10-CM | POA: Diagnosis not present

## 2015-01-19 DIAGNOSIS — E871 Hypo-osmolality and hyponatremia: Secondary | ICD-10-CM | POA: Diagnosis not present

## 2015-01-19 DIAGNOSIS — J9602 Acute respiratory failure with hypercapnia: Secondary | ICD-10-CM

## 2015-01-19 DIAGNOSIS — I252 Old myocardial infarction: Secondary | ICD-10-CM | POA: Diagnosis not present

## 2015-01-19 DIAGNOSIS — J9600 Acute respiratory failure, unspecified whether with hypoxia or hypercapnia: Secondary | ICD-10-CM

## 2015-01-19 DIAGNOSIS — R109 Unspecified abdominal pain: Secondary | ICD-10-CM | POA: Diagnosis present

## 2015-01-19 DIAGNOSIS — T148XXA Other injury of unspecified body region, initial encounter: Secondary | ICD-10-CM

## 2015-01-19 DIAGNOSIS — T17908A Unspecified foreign body in respiratory tract, part unspecified causing other injury, initial encounter: Secondary | ICD-10-CM

## 2015-01-19 DIAGNOSIS — R0600 Dyspnea, unspecified: Secondary | ICD-10-CM

## 2015-01-19 DIAGNOSIS — E872 Acidosis: Secondary | ICD-10-CM

## 2015-01-19 DIAGNOSIS — E46 Unspecified protein-calorie malnutrition: Secondary | ICD-10-CM

## 2015-01-19 LAB — CBC WITH DIFFERENTIAL/PLATELET
Basophils Absolute: 0 10*3/uL (ref 0.0–0.1)
Basophils Relative: 0 % (ref 0–1)
Eosinophils Absolute: 0 10*3/uL (ref 0.0–0.7)
Eosinophils Relative: 0 % (ref 0–5)
HCT: 28.3 % — ABNORMAL LOW (ref 39.0–52.0)
Hemoglobin: 9.2 g/dL — ABNORMAL LOW (ref 13.0–17.0)
Lymphocytes Relative: 4 % — ABNORMAL LOW (ref 12–46)
Lymphs Abs: 1 10*3/uL (ref 0.7–4.0)
MCH: 30.7 pg (ref 26.0–34.0)
MCHC: 32.5 g/dL (ref 30.0–36.0)
MCV: 94.3 fL (ref 78.0–100.0)
Monocytes Absolute: 1.1 10*3/uL — ABNORMAL HIGH (ref 0.1–1.0)
Monocytes Relative: 5 % (ref 3–12)
NEUTROS ABS: 20.4 10*3/uL — AB (ref 1.7–7.7)
NEUTROS PCT: 91 % — AB (ref 43–77)
PLATELETS: 426 10*3/uL — AB (ref 150–400)
RBC: 3 MIL/uL — ABNORMAL LOW (ref 4.22–5.81)
RDW: 15.2 % (ref 11.5–15.5)
WBC: 22.5 10*3/uL — ABNORMAL HIGH (ref 4.0–10.5)

## 2015-01-19 LAB — URINALYSIS, ROUTINE W REFLEX MICROSCOPIC
Bilirubin Urine: NEGATIVE
Glucose, UA: NEGATIVE mg/dL
Hgb urine dipstick: NEGATIVE
Ketones, ur: NEGATIVE mg/dL
Leukocytes, UA: NEGATIVE
Nitrite: NEGATIVE
PROTEIN: NEGATIVE mg/dL
UROBILINOGEN UA: 0.2 mg/dL (ref 0.0–1.0)
pH: 5 (ref 5.0–8.0)

## 2015-01-19 LAB — COMPREHENSIVE METABOLIC PANEL
ALT: 15 U/L — ABNORMAL LOW (ref 17–63)
AST: 20 U/L (ref 15–41)
Albumin: 2.8 g/dL — ABNORMAL LOW (ref 3.5–5.0)
Alkaline Phosphatase: 63 U/L (ref 38–126)
Anion gap: 7 (ref 5–15)
BUN: 27 mg/dL — AB (ref 6–20)
CO2: 25 mmol/L (ref 22–32)
Calcium: 7.9 mg/dL — ABNORMAL LOW (ref 8.9–10.3)
Chloride: 106 mmol/L (ref 101–111)
Creatinine, Ser: 1.8 mg/dL — ABNORMAL HIGH (ref 0.61–1.24)
GFR calc Af Amer: 43 mL/min — ABNORMAL LOW (ref >60)
GFR calc non Af Amer: 37 mL/min — ABNORMAL LOW (ref >60)
Glucose, Bld: 276 mg/dL — ABNORMAL HIGH (ref 65–99)
Potassium: 5.5 mmol/L — ABNORMAL HIGH (ref 3.5–5.1)
Sodium: 138 mmol/L (ref 135–145)
Total Bilirubin: 1.4 mg/dL — ABNORMAL HIGH (ref 0.3–1.2)
Total Protein: 5.8 g/dL — ABNORMAL LOW (ref 6.5–8.1)

## 2015-01-19 LAB — LIPASE, BLOOD: Lipase: 14 U/L — ABNORMAL LOW (ref 22–51)

## 2015-01-19 LAB — CBG MONITORING, ED: Glucose-Capillary: 256 mg/dL — ABNORMAL HIGH (ref 65–99)

## 2015-01-19 LAB — SAMPLE TO BLOOD BANK

## 2015-01-19 LAB — GLUCOSE, CAPILLARY: GLUCOSE-CAPILLARY: 263 mg/dL — AB (ref 65–99)

## 2015-01-19 LAB — PROTIME-INR
INR: 1.46 (ref 0.00–1.49)
PROTHROMBIN TIME: 17.8 s — AB (ref 11.6–15.2)

## 2015-01-19 LAB — MAGNESIUM: Magnesium: 1.8 mg/dL (ref 1.7–2.4)

## 2015-01-19 LAB — APTT: aPTT: 35 seconds (ref 24–37)

## 2015-01-19 MED ORDER — ATORVASTATIN CALCIUM 80 MG PO TABS
80.0000 mg | ORAL_TABLET | Freq: Every day | ORAL | Status: DC
  Filled 2015-01-19: qty 1

## 2015-01-19 MED ORDER — MORPHINE SULFATE 2 MG/ML IJ SOLN: 1.0000 mg | INTRAMUSCULAR | Status: DC | PRN

## 2015-01-19 MED ORDER — ONDANSETRON HCL 4 MG/2ML IJ SOLN
4.0000 mg | Freq: Four times a day (QID) | INTRAMUSCULAR | Status: DC | PRN
  Administered 2015-01-20 – 2015-02-16 (×18): 4 mg via INTRAVENOUS
  Filled 2015-01-19 (×22): qty 2

## 2015-01-19 MED ORDER — SODIUM CHLORIDE 0.9 % IV SOLN: 1250.0000 mg | INTRAVENOUS | Status: DC

## 2015-01-19 MED ORDER — ACETAMINOPHEN 650 MG RE SUPP: 650.0000 mg | Freq: Four times a day (QID) | RECTAL | Status: DC | PRN

## 2015-01-19 MED ORDER — CITALOPRAM HYDROBROMIDE 40 MG PO TABS
40.0000 mg | ORAL_TABLET | Freq: Every morning | ORAL | Status: DC
  Filled 2015-01-19: qty 1

## 2015-01-19 MED ORDER — ALLOPURINOL 300 MG PO TABS
300.0000 mg | ORAL_TABLET | Freq: Every day | ORAL | Status: DC
  Filled 2015-01-19 (×2): qty 1

## 2015-01-19 MED ORDER — PIPERACILLIN-TAZOBACTAM 3.375 G IVPB
3.3750 g | Freq: Three times a day (TID) | INTRAVENOUS | Status: DC
  Administered 2015-01-19 – 2015-01-29 (×29): 3.375 g via INTRAVENOUS
  Filled 2015-01-19 (×28): qty 50

## 2015-01-19 MED ORDER — VANCOMYCIN HCL 10 G IV SOLR
2500.0000 mg | Freq: Once | INTRAVENOUS | Status: AC
  Administered 2015-01-19: 2500 mg via INTRAVENOUS
  Filled 2015-01-19: qty 2500

## 2015-01-19 MED ORDER — PIPERACILLIN-TAZOBACTAM 3.375 G IVPB 30 MIN
3.3750 g | Freq: Once | INTRAVENOUS | Status: AC
  Administered 2015-01-19: 3.375 g via INTRAVENOUS
  Filled 2015-01-19: qty 50

## 2015-01-19 MED ORDER — ONDANSETRON HCL 4 MG PO TABS
8.0000 mg | ORAL_TABLET | Freq: Two times a day (BID) | ORAL | Status: DC
  Administered 2015-01-21 – 2015-02-07 (×30): 8 mg via ORAL
  Filled 2015-01-19 (×25): qty 2
  Filled 2015-01-19: qty 1
  Filled 2015-01-19: qty 2
  Filled 2015-01-19: qty 1
  Filled 2015-01-19 (×7): qty 2

## 2015-01-19 MED ORDER — SODIUM CHLORIDE 0.9 % IV SOLN
INTRAVENOUS | Status: DC
  Administered 2015-01-19: 16:00:00 via INTRAVENOUS

## 2015-01-19 MED ORDER — ONDANSETRON HCL 4 MG/2ML IJ SOLN
4.0000 mg | Freq: Once | INTRAMUSCULAR | Status: AC
  Administered 2015-01-19: 4 mg via INTRAVENOUS
  Filled 2015-01-19: qty 2

## 2015-01-19 MED ORDER — FENTANYL CITRATE (PF) 100 MCG/2ML IJ SOLN
100.0000 ug | INTRAMUSCULAR | Status: DC | PRN
  Administered 2015-01-19 (×2): 100 ug via INTRAVENOUS
  Filled 2015-01-19 (×2): qty 2

## 2015-01-19 MED ORDER — FUROSEMIDE 40 MG PO TABS: 40.0000 mg | ORAL_TABLET | Freq: Every morning | ORAL | Status: DC

## 2015-01-19 MED ORDER — INSULIN GLARGINE 100 UNIT/ML ~~LOC~~ SOLN
15.0000 [IU] | Freq: Every day | SUBCUTANEOUS | Status: DC
  Administered 2015-01-19 – 2015-01-29 (×11): 15 [IU] via SUBCUTANEOUS
  Filled 2015-01-19 (×11): qty 0.15

## 2015-01-19 MED ORDER — ACETAMINOPHEN 325 MG PO TABS: 650.0000 mg | ORAL_TABLET | Freq: Four times a day (QID) | ORAL | Status: DC | PRN

## 2015-01-19 MED ORDER — SODIUM CHLORIDE 0.9 % IV BOLUS (SEPSIS)
500.0000 mL | Freq: Once | INTRAVENOUS | Status: AC
  Administered 2015-01-19: 500 mL via INTRAVENOUS

## 2015-01-19 MED ORDER — INSULIN ASPART 100 UNIT/ML ~~LOC~~ SOLN
0.0000 [IU] | SUBCUTANEOUS | Status: DC
  Administered 2015-01-19: 8 [IU] via SUBCUTANEOUS
  Administered 2015-01-20 (×3): 3 [IU] via SUBCUTANEOUS
  Administered 2015-01-20: 8 [IU] via SUBCUTANEOUS
  Administered 2015-01-20 – 2015-01-22 (×9): 3 [IU] via SUBCUTANEOUS
  Administered 2015-01-22: 5 [IU] via SUBCUTANEOUS
  Administered 2015-01-22 (×2): 3 [IU] via SUBCUTANEOUS
  Administered 2015-01-22: 5 [IU] via SUBCUTANEOUS
  Administered 2015-01-23: 8 [IU] via SUBCUTANEOUS
  Administered 2015-01-23: 5 [IU] via SUBCUTANEOUS
  Administered 2015-01-23: 3 [IU] via SUBCUTANEOUS
  Administered 2015-01-23 (×2): 5 [IU] via SUBCUTANEOUS
  Administered 2015-01-23 – 2015-01-25 (×11): 3 [IU] via SUBCUTANEOUS
  Administered 2015-01-25: 2 [IU] via SUBCUTANEOUS
  Administered 2015-01-25 – 2015-01-26 (×2): 3 [IU] via SUBCUTANEOUS
  Administered 2015-01-26: 2 [IU] via SUBCUTANEOUS
  Administered 2015-01-26: 3 [IU] via SUBCUTANEOUS
  Administered 2015-01-26: 8 [IU] via SUBCUTANEOUS
  Administered 2015-01-26: 2 [IU] via SUBCUTANEOUS
  Administered 2015-01-26: 3 [IU] via SUBCUTANEOUS
  Administered 2015-01-27 (×2): 2 [IU] via SUBCUTANEOUS
  Administered 2015-01-27 (×4): 3 [IU] via SUBCUTANEOUS
  Administered 2015-01-27: 2 [IU] via SUBCUTANEOUS
  Administered 2015-01-28 (×3): 3 [IU] via SUBCUTANEOUS
  Administered 2015-01-28: 2 [IU] via SUBCUTANEOUS
  Administered 2015-01-29 (×2): 5 [IU] via SUBCUTANEOUS
  Administered 2015-01-29: 2 [IU] via SUBCUTANEOUS
  Administered 2015-01-29: 3 [IU] via SUBCUTANEOUS
  Administered 2015-01-29 (×3): 5 [IU] via SUBCUTANEOUS
  Administered 2015-01-30: 3 [IU] via SUBCUTANEOUS
  Administered 2015-01-30: 8 [IU] via SUBCUTANEOUS
  Administered 2015-01-30 – 2015-01-31 (×5): 3 [IU] via SUBCUTANEOUS
  Administered 2015-01-31 (×2): 5 [IU] via SUBCUTANEOUS
  Administered 2015-01-31: 2 [IU] via SUBCUTANEOUS
  Administered 2015-01-31: 5 [IU] via SUBCUTANEOUS
  Administered 2015-02-01 (×3): 3 [IU] via SUBCUTANEOUS
  Administered 2015-02-01: 5 [IU] via SUBCUTANEOUS
  Administered 2015-02-01 (×2): 3 [IU] via SUBCUTANEOUS
  Administered 2015-02-02: 2 [IU] via SUBCUTANEOUS
  Administered 2015-02-02: 3 [IU] via SUBCUTANEOUS
  Administered 2015-02-02: 5 [IU] via SUBCUTANEOUS
  Administered 2015-02-02 (×3): 3 [IU] via SUBCUTANEOUS
  Administered 2015-02-03 (×2): 2 [IU] via SUBCUTANEOUS
  Administered 2015-02-03: 3 [IU] via SUBCUTANEOUS
  Administered 2015-02-03 – 2015-02-04 (×2): 2 [IU] via SUBCUTANEOUS
  Administered 2015-02-04: 3 [IU] via SUBCUTANEOUS
  Administered 2015-02-04: 5 [IU] via SUBCUTANEOUS
  Administered 2015-02-04: 2 [IU] via SUBCUTANEOUS
  Administered 2015-02-04 – 2015-02-05 (×5): 3 [IU] via SUBCUTANEOUS
  Administered 2015-02-06 (×2): 2 [IU] via SUBCUTANEOUS
  Administered 2015-02-07: 5 [IU] via SUBCUTANEOUS
  Administered 2015-02-07: 3 [IU] via SUBCUTANEOUS
  Administered 2015-02-07 (×2): 2 [IU] via SUBCUTANEOUS
  Administered 2015-02-08 (×2): 3 [IU] via SUBCUTANEOUS
  Administered 2015-02-08: 2 [IU] via SUBCUTANEOUS
  Administered 2015-02-08: 3 [IU] via SUBCUTANEOUS
  Administered 2015-02-08 (×2): 5 [IU] via SUBCUTANEOUS
  Administered 2015-02-09: 2 [IU] via SUBCUTANEOUS
  Administered 2015-02-09: 3 [IU] via SUBCUTANEOUS
  Administered 2015-02-09 (×2): 2 [IU] via SUBCUTANEOUS
  Administered 2015-02-10: 3 [IU] via SUBCUTANEOUS
  Administered 2015-02-10 (×2): 2 [IU] via SUBCUTANEOUS
  Administered 2015-02-10 (×2): 3 [IU] via SUBCUTANEOUS
  Administered 2015-02-10: 5 [IU] via SUBCUTANEOUS
  Administered 2015-02-11: 11 [IU] via SUBCUTANEOUS
  Administered 2015-02-11: 8 [IU] via SUBCUTANEOUS
  Administered 2015-02-11: 3 [IU] via SUBCUTANEOUS
  Administered 2015-02-11 (×2): 11 [IU] via SUBCUTANEOUS
  Administered 2015-02-11: 8 [IU] via SUBCUTANEOUS
  Administered 2015-02-12: 11 [IU] via SUBCUTANEOUS
  Administered 2015-02-12: 15 [IU] via SUBCUTANEOUS
  Administered 2015-02-12: 11 [IU] via SUBCUTANEOUS

## 2015-01-19 MED ORDER — OXYCODONE-ACETAMINOPHEN 5-325 MG PO TABS
1.0000 | ORAL_TABLET | ORAL | Status: DC | PRN
  Administered 2015-01-19: 2 via ORAL
  Filled 2015-01-19: qty 2

## 2015-01-19 MED ORDER — GLUCOSE 4 G PO CHEW
1.0000 | CHEWABLE_TABLET | ORAL | Status: DC | PRN
  Filled 2015-01-19: qty 1

## 2015-01-19 MED ORDER — AMLODIPINE BESYLATE 10 MG PO TABS
10.0000 mg | ORAL_TABLET | Freq: Every morning | ORAL | Status: DC
  Filled 2015-01-19: qty 1

## 2015-01-19 MED ORDER — PANTOPRAZOLE SODIUM 40 MG IV SOLR
40.0000 mg | Freq: Every day | INTRAVENOUS | Status: DC
  Administered 2015-01-20: 40 mg via INTRAVENOUS
  Filled 2015-01-19: qty 40

## 2015-01-19 MED ORDER — SODIUM CHLORIDE 0.9 % IV BOLUS (SEPSIS)
1000.0000 mL | Freq: Once | INTRAVENOUS | Status: AC
  Administered 2015-01-19: 1000 mL via INTRAVENOUS

## 2015-01-19 MED ORDER — SODIUM CHLORIDE 0.9 % IV SOLN
INTRAVENOUS | Status: DC
  Administered 2015-01-19 – 2015-01-20 (×4): via INTRAVENOUS

## 2015-01-19 MED ORDER — ONDANSETRON 4 MG PO TBDP
4.0000 mg | ORAL_TABLET | Freq: Four times a day (QID) | ORAL | Status: DC | PRN
  Administered 2015-01-19: 4 mg via ORAL
  Filled 2015-01-19 (×2): qty 1

## 2015-01-19 MED ORDER — PANTOPRAZOLE SODIUM 40 MG PO TBEC: 40.0000 mg | DELAYED_RELEASE_TABLET | Freq: Every day | ORAL | Status: DC

## 2015-01-19 MED ORDER — BOOST / RESOURCE BREEZE PO LIQD: 1.0000 | Freq: Three times a day (TID) | ORAL | Status: DC
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
## 2015-01-19 NOTE — ED Notes (Signed)
Patient here from [facility] with complaints of increased abd pain since lap cholecystectomy. Complaints of right upper/lower quadrant pain. CT evidence of abscess and free fluid. Nausea.
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
## 2015-01-19 NOTE — ED Notes (Signed)
Bed: [facility] Expected date:  Expected time:  Means of arrival:  Comments: Hold for [facility] transfer

## 2015-01-20 ENCOUNTER — Encounter (HOSPITAL_COMMUNITY): Payer: Self-pay | Admitting: Radiology

## 2015-01-20 ENCOUNTER — Inpatient Hospital Stay (HOSPITAL_COMMUNITY): Payer: Medicare Other

## 2015-01-20 DIAGNOSIS — K651 Peritoneal abscess: Secondary | ICD-10-CM

## 2015-01-20 DIAGNOSIS — A419 Sepsis, unspecified organism: Secondary | ICD-10-CM

## 2015-01-20 DIAGNOSIS — R579 Shock, unspecified: Secondary | ICD-10-CM

## 2015-01-20 LAB — BASIC METABOLIC PANEL
ANION GAP: 6 (ref 5–15)
ANION GAP: 7 (ref 5–15)
Anion gap: 6 (ref 5–15)
BUN: 32 mg/dL — ABNORMAL HIGH (ref 6–20)
BUN: 36 mg/dL — AB (ref 6–20)
BUN: 36 mg/dL — ABNORMAL HIGH (ref 6–20)
CALCIUM: 7.7 mg/dL — AB (ref 8.9–10.3)
CHLORIDE: 112 mmol/L — AB (ref 101–111)
CHLORIDE: 112 mmol/L — AB (ref 101–111)
CO2: 23 mmol/L (ref 22–32)
CO2: 24 mmol/L (ref 22–32)
CO2: 21 mmol/L — ABNORMAL LOW (ref 22–32)
Calcium: 7.6 mg/dL — ABNORMAL LOW (ref 8.9–10.3)
Calcium: 7.5 mg/dL — ABNORMAL LOW (ref 8.9–10.3)
Chloride: 109 mmol/L (ref 101–111)
Creatinine, Ser: 2.31 mg/dL — ABNORMAL HIGH (ref 0.61–1.24)
Creatinine, Ser: 2.64 mg/dL — ABNORMAL HIGH (ref 0.61–1.24)
Creatinine, Ser: 2.33 mg/dL — ABNORMAL HIGH (ref 0.61–1.24)
GFR calc Af Amer: 32 mL/min — ABNORMAL LOW (ref >60)
GFR calc Af Amer: 27 mL/min — ABNORMAL LOW (ref >60)
GFR calc Af Amer: 31 mL/min — ABNORMAL LOW (ref >60)
GFR calc non Af Amer: 27 mL/min — ABNORMAL LOW (ref >60)
GFR calc non Af Amer: 23 mL/min — ABNORMAL LOW (ref >60)
GFR calc non Af Amer: 27 mL/min — ABNORMAL LOW (ref >60)
GLUCOSE: 183 mg/dL — AB (ref 65–99)
Glucose, Bld: 239 mg/dL — ABNORMAL HIGH (ref 65–99)
Glucose, Bld: 171 mg/dL — ABNORMAL HIGH (ref 65–99)
POTASSIUM: 5.8 mmol/L — AB (ref 3.5–5.1)
Potassium: 5.8 mmol/L — ABNORMAL HIGH (ref 3.5–5.1)
Potassium: 6 mmol/L — ABNORMAL HIGH (ref 3.5–5.1)
Sodium: 138 mmol/L (ref 135–145)
Sodium: 142 mmol/L (ref 135–145)
Sodium: 140 mmol/L (ref 135–145)

## 2015-01-20 LAB — CBC WITH DIFFERENTIAL/PLATELET
BASOS PCT: 0 % (ref 0–1)
Basophils Absolute: 0 10*3/uL (ref 0.0–0.1)
Eosinophils Absolute: 0 10*3/uL (ref 0.0–0.7)
Eosinophils Relative: 0 % (ref 0–5)
HEMATOCRIT: 24.2 % — AB (ref 39.0–52.0)
Hemoglobin: 7.8 g/dL — ABNORMAL LOW (ref 13.0–17.0)
Lymphocytes Relative: 4 % — ABNORMAL LOW (ref 12–46)
Lymphs Abs: 0.7 10*3/uL (ref 0.7–4.0)
MCH: 29.4 pg (ref 26.0–34.0)
MCHC: 32.2 g/dL (ref 30.0–36.0)
MCV: 91.3 fL (ref 78.0–100.0)
MONOS PCT: 9 % (ref 3–12)
Monocytes Absolute: 1.6 10*3/uL — ABNORMAL HIGH (ref 0.1–1.0)
Neutro Abs: 16.2 10*3/uL — ABNORMAL HIGH (ref 1.7–7.7)
Neutrophils Relative %: 87 % — ABNORMAL HIGH (ref 43–77)
Platelets: 319 10*3/uL (ref 150–400)
RBC: 2.65 MIL/uL — ABNORMAL LOW (ref 4.22–5.81)
RDW: 15.8 % — ABNORMAL HIGH (ref 11.5–15.5)
WBC: 18.5 10*3/uL — AB (ref 4.0–10.5)

## 2015-01-20 LAB — LACTIC ACID, PLASMA
Lactic Acid, Venous: 1.6 mmol/L (ref 0.5–2.0)
Lactic Acid, Venous: 1.8 mmol/L (ref 0.5–2.0)
Lactic Acid, Venous: 0.8 mmol/L (ref 0.5–2.0)

## 2015-01-20 LAB — GLUCOSE, CAPILLARY
GLUCOSE-CAPILLARY: 160 mg/dL — AB (ref 65–99)
GLUCOSE-CAPILLARY: 161 mg/dL — AB (ref 65–99)
GLUCOSE-CAPILLARY: 168 mg/dL — AB (ref 65–99)
GLUCOSE-CAPILLARY: 266 mg/dL — AB (ref 65–99)
Glucose-Capillary: 195 mg/dL — ABNORMAL HIGH (ref 65–99)
Glucose-Capillary: 192 mg/dL — ABNORMAL HIGH (ref 65–99)

## 2015-01-20 LAB — CBC
HEMATOCRIT: 23.3 % — AB (ref 39.0–52.0)
Hemoglobin: 7.6 g/dL — ABNORMAL LOW (ref 13.0–17.0)
MCH: 31.1 pg (ref 26.0–34.0)
MCHC: 32.6 g/dL (ref 30.0–36.0)
MCV: 95.5 fL (ref 78.0–100.0)
Platelets: 370 10*3/uL (ref 150–400)
RBC: 2.44 MIL/uL — AB (ref 4.22–5.81)
RDW: 15.4 % (ref 11.5–15.5)
WBC: 19.7 10*3/uL — ABNORMAL HIGH (ref 4.0–10.5)

## 2015-01-20 LAB — MRSA PCR SCREENING: MRSA by PCR: NEGATIVE

## 2015-01-20 LAB — PREPARE RBC (CROSSMATCH)

## 2015-01-20 LAB — ABO/RH: ABO/RH(D): O NEG

## 2015-01-20 LAB — CORTISOL: CORTISOL PLASMA: 31.9 ug/dL

## 2015-01-20 MED ORDER — SODIUM CHLORIDE 0.9 % IV BOLUS (SEPSIS)
500.0000 mL | Freq: Once | INTRAVENOUS | Status: AC
  Administered 2015-01-20: 500 mL via INTRAVENOUS

## 2015-01-20 MED ORDER — LORAZEPAM 2 MG/ML IJ SOLN
0.5000 mg | INTRAMUSCULAR | Status: DC | PRN
  Administered 2015-01-20 – 2015-02-07 (×32): 1 mg via INTRAVENOUS
  Administered 2015-02-07: 0.5 mg via INTRAVENOUS
  Administered 2015-02-08 – 2015-02-10 (×7): 1 mg via INTRAVENOUS
  Filled 2015-01-20 (×43): qty 1

## 2015-01-20 MED ORDER — SODIUM CHLORIDE 0.9 % IV BOLUS (SEPSIS)
250.0000 mL | Freq: Once | INTRAVENOUS | Status: AC
  Administered 2015-01-20: 250 mL via INTRAVENOUS

## 2015-01-20 MED ORDER — MIDAZOLAM HCL 2 MG/2ML IJ SOLN
INTRAMUSCULAR | Status: AC
  Filled 2015-01-20: qty 4

## 2015-01-20 MED ORDER — SODIUM CHLORIDE 0.9 % IV SOLN
Freq: Once | INTRAVENOUS | Status: AC
  Administered 2015-01-20: 08:00:00 via INTRAVENOUS

## 2015-01-20 MED ORDER — SODIUM CHLORIDE 0.9 % IV BOLUS (SEPSIS)
1000.0000 mL | Freq: Once | INTRAVENOUS | Status: AC
  Administered 2015-01-20: 1000 mL via INTRAVENOUS

## 2015-01-20 MED ORDER — FENTANYL CITRATE (PF) 100 MCG/2ML IJ SOLN
25.0000 ug | INTRAMUSCULAR | Status: DC | PRN
  Administered 2015-01-20 – 2015-01-28 (×28): 50 ug via INTRAVENOUS
  Administered 2015-01-28: 25 ug via INTRAVENOUS
  Administered 2015-01-29: 50 ug via INTRAVENOUS
  Administered 2015-01-29 (×2): 25 ug via INTRAVENOUS
  Administered 2015-01-30 – 2015-02-06 (×19): 50 ug via INTRAVENOUS
  Administered 2015-02-07: 25 ug via INTRAVENOUS
  Administered 2015-02-07 (×2): 50 ug via INTRAVENOUS
  Administered 2015-02-07: 25 ug via INTRAVENOUS
  Administered 2015-02-07: 50 ug via INTRAVENOUS
  Administered 2015-02-07 (×2): 25 ug via INTRAVENOUS
  Administered 2015-02-07 – 2015-02-08 (×2): 50 ug via INTRAVENOUS
  Administered 2015-02-08: 25 ug via INTRAVENOUS
  Administered 2015-02-08 – 2015-02-09 (×3): 50 ug via INTRAVENOUS
  Filled 2015-01-20 (×65): qty 2

## 2015-01-20 MED ORDER — SODIUM POLYSTYRENE SULFONATE 15 GM/60ML PO SUSP
30.0000 g | Freq: Once | ORAL | Status: AC
  Administered 2015-01-20: 30 g via ORAL
  Filled 2015-01-20: qty 120

## 2015-01-20 MED ORDER — VANCOMYCIN HCL 10 G IV SOLR
1250.0000 mg | INTRAVENOUS | Status: DC
  Administered 2015-01-20: 1250 mg via INTRAVENOUS
  Filled 2015-01-20: qty 1250

## 2015-01-20 MED ORDER — SODIUM CHLORIDE 0.9 % IV SOLN
Freq: Once | INTRAVENOUS | Status: AC
  Administered 2015-01-20: 07:00:00 via INTRAVENOUS

## 2015-01-20 MED ORDER — SODIUM BICARBONATE 8.4 % IV SOLN
INTRAVENOUS | Status: DC
  Administered 2015-01-20: 18:00:00 via INTRAVENOUS
  Filled 2015-01-20 (×3): qty 150

## 2015-01-20 MED ORDER — PANTOPRAZOLE SODIUM 40 MG IV SOLR
40.0000 mg | Freq: Two times a day (BID) | INTRAVENOUS | Status: DC
  Administered 2015-01-20 – 2015-01-25 (×12): 40 mg via INTRAVENOUS
  Filled 2015-01-20 (×12): qty 40

## 2015-01-20 MED ORDER — FENTANYL CITRATE (PF) 100 MCG/2ML IJ SOLN
INTRAMUSCULAR | Status: AC
  Filled 2015-01-20: qty 4

## 2015-01-20 MED ORDER — FENTANYL CITRATE (PF) 100 MCG/2ML IJ SOLN
INTRAMUSCULAR | Status: AC
  Filled 2015-01-20: qty 2

## 2015-01-20 MED ORDER — SODIUM CHLORIDE 0.9 % IV SOLN
Freq: Once | INTRAVENOUS | Status: DC
  Administered 2015-01-20: 15:00:00 via INTRAVENOUS

## 2015-01-20 NOTE — Progress Notes (Signed)
Initial Nutrition Assessment   DOCUMENTATION CODES:   Morbid obesity  INTERVENTION:  - Will monitor for needs on follow-up  NUTRITION DIAGNOSIS:   Inadequate oral intake related to inability to eat as evidenced by NPO status.  GOAL:   Patient will meet greater than or equal to 90% of their needs  MONITOR:   Diet advancement, Weight trends, Labs, I & O's  REASON FOR ASSESSMENT:   Malnutrition Screening Tool  ASSESSMENT:   69 y/o admitted in 10/05/2012 with Sepsis, hypotension, acute renal failure and acute cholecystitis. He had been place on Eliquis for his AF by the [place]. He had an MI 2 years ago and did not follow up with his cardiologist. He was acutely ill and underwent a percutaneous drain placement on 10/06/14 by IR. He went home on 10/11/14. He was readmitted on 12/30/14 and underwent laparoscopic cholecystectomy with IOC. He was left with a drain in place and the site had "SNOW' placed in the GB fossa for hemostasis after the procedure. Drain was removed last week but there was some brusing at the site. He is transferred from [facility] with complaints of increased abdominal pain.  Pt seen for MST. BMI indicates morbid obesity. Pt sleeping at time of visit and family member in the room provides all information as outlined below. Physical assessment not done at this time.  Pt has had a poor appetite x1 month. He has been in and out of the hospital during this time with several instances of NPO status. His last "good" meal was on Tuesday (8/2). Pt has been having nausea and vomiting but unsure of when this began. He has been complaining of 10/10 pain recently and this AM was the first time he stated it was lower, 8/10.  Family states unsure of UBW but that at pt's last visit at the [place] he weighed 156 lbs. This is not consistent with weight hx. Per weight hx review, pt lost 16 lbs (6% body weight) from 10/01/14-01/04/15 which is not significant for time frame. Since 01/04/15 he has  gained 11 lbs; this may be partially fluid related as family member states abdominal distention the past few days.  Per notes, pt needs NGT but has been refusing. Will monitor for this and needs moving forward.  Unable to meet needs with NPO status. Medications reviewed. Labs reviewed; CBGs: 168-266 mg/dL, K: 5.8 mg/dL, BUN/creatinine elevated, Ca: 7.6 mg/dL, GFR: 27.  Diet Order:  Diet NPO time specified  Skin:  Wound (see comment) (abdominal wound (first recorded 01/04/15))  Last BM:  8/3  Height:   Ht Readings from Last 1 Encounters:  01/19/15  (1.676 m)    Weight:   Wt Readings from Last 1 Encounters:  01/19/15 261 lb 7.5 oz (118.6 kg)    Ideal Body Weight:  64.54 kg (kg)  BMI:  Body mass index is 42.22 kg/(m^2).  Estimated Nutritional Needs:   Kcal:  1610-9604  Protein:  95-105 grams  Fluid:  1.7-2.2 L/day  EDUCATION NEEDS:   No education needs identified at this time     Trenton Gammon, RD, LDN Inpatient Clinical Dietitian Pager # [phone] After hours/weekend pager # [phone]

## 2015-01-20 NOTE — Procedures (Signed)
Central Venous Catheter Insertion Procedure Note Dvante R AnMECCA BARGA5 1946-05-20  Procedure: Insertion of Central Venous Catheter Indications: Assessment of intravascular volume, Drug and/or fluid administration and Frequent blood sampling  Procedure Details Consent: Risks of procedure as well as the alternatives and risks of each were explained to the (patient/caregiver).  Consent for procedure obtained. Time Out: Verified patient identification, verified procedure, site/side was marked, verified correct patient position, special equipment/implants available, medications/allergies/relevent history reviewed, required imaging and test results available.  Performed  Maximum sterile technique was used including antiseptics, cap, gloves, gown, hand hygiene, mask and sheet. Skin prep: Chlorhexidine; local anesthetic administered A antimicrobial bonded/coated triple lumen catheter was placed in the right internal jugular vein using the Seldinger technique.  Evaluation Blood flow good Complications: No apparent complications Patient did tolerate procedure well. Chest X-ray ordered to verify placement.  CXR: pending.  Nelda Bucks 01/20/2015, 4:41 PM  [place]  Mcarthur Rossetti. Tyson Alias, MD, FACP Pgr: [phone] [facility]

## 2015-01-20 NOTE — Progress Notes (Signed)
Subjective: Creatinine is up, H/H down earlier, getting blood now and BP 94/46. Right now he is stable, some nausea but not much, refused NG.   IR in to see now, and being transfused.  I called pharmacy also to make sure they knew creatinine is up.  Only 225 urine recorded.  Objective: Vital signs in last 24 hours: Temp:  [97.5 F (36.4 C)-99.8 F (37.7 C)] 98.2 F (36.8 C) (08/04 0900) Pulse Rate:  [70-104] 90 (08/04 0900) Resp:  [14-30] 16 (08/04 0900) BP: (73-113)/(38-58) 73/46 mmHg (08/04 0900) SpO2:  [86 %-98 %] 93 % (08/04 0900) Weight:  [118.6 kg (261 lb 7.5 oz)] 118.6 kg (261 lb 7.5 oz) (08/03 1900) Last BM Date: 01/19/15 Vomiting last PM not recorded he refused NG,   Urine 225 recorded. Afebrile, BP down  Creatinine up to 2.3 H/h down to 7.6/27 - 9.2/28 last PM in ED Last dose of Eliquis was 8/2  IR aware and will get him in today Intake/Output from previous day: 08/03 0701 - 08/04 0700 In: 3143.8 [I.V.:1043.8; IV Piggyback:2100] Out: 225 [Urine:225] Intake/Output this shift: Total I/O In: 30 [Blood:30] Out: -   General appearance: alert, cooperative and no distress Resp: clear to auscultation bilaterally GI: soft, he is tender all over.  No BS this AM  Lab Results:   Recent Labs  01/19/15 1613 01/20/15 0320  WBC 22.5* 19.7*  HGB 9.2* 7.6*  HCT 28.3* 23.3*  PLT 426* 370    BMET  Recent Labs  01/19/15 1613 01/20/15 0320  NA 138 138  K 5.5* 5.8*  CL 106 109  CO2 25 23  GLUCOSE 276* 239*  BUN 27* 32*  CREATININE 1.80* 2.31*  CALCIUM 7.9* 7.6*   PT/INR  Recent Labs  01/19/15 1613  LABPROT 17.8*  INR 1.46     Recent Labs Lab 01/19/15 1613  AST 20  ALT 15*  ALKPHOS 63  BILITOT 1.4*  PROT 5.8*  ALBUMIN 2.8*     Lipase     Component Value Date/Time   LIPASE 14* 01/19/2015 1613     Studies/Results: Dg Chest Port 1 View  01/19/2015   CLINICAL DATA:  Increasing abdominal pain since laparoscopic cholecystectomy 01/04/2015.  Right upper and lower quadrant pain. Nausea, shortness of breath and weakness.  EXAM: PORTABLE CHEST - 1 VIEW  COMPARISON:  10/06/2014  FINDINGS: Patient is rotated to the right. Lungs are hypoinflated with minimal right perihilar and medial right basilar opacification which may be due to atelectasis or infection. No evidence of effusion or pneumothorax. Cardiomediastinal silhouette and remainder of the exam is unchanged.  IMPRESSION: Mild opacification in the right perihilar region and medial right base which may be due to atelectasis or infection.   Electronically Signed   By: Elberta Fortis M.D.   On: 01/19/2015 16:19    Medications: . feeding supplement  1 Container Oral TID BM  . insulin aspart  0-15 Units Subcutaneous 6 times per day  . insulin glargine  15 Units Subcutaneous QHS  . ondansetron  8 mg Oral BID  . pantoprazole (PROTONIX) IV  40 mg Intravenous Q12H  . piperacillin-tazobactam (ZOSYN)  IV  3.375 g Intravenous Q8H   . sodium chloride 125 mL/hr at 01/20/15 1610   Assessment/Plan Intraabdominal abscess, peritonitis S/p laparoscopic cholecystectomy 12/30/14 with drain placement S/p sepsis, acute cholecystitis, shock, acute renal failure, with IR cholecystostomy, 09/2014. PAF on chronic anticoagulation - Eliquis CAD with MI/DES stent 2 years ago Acute on chronic renal insuffiencey AODM  ID Hypertension Anemia  Gout Multiple vertebral fractures Obesity Antibiotics:  Day 2 Zosyn/Vancomycin DVT:  On Eliquis last dose 01/18/15/SCD    Plan:  Transfer to SDU, he is being transfused.  I have talked to Pharmacy about his creatinine/vancomycin and they will adjust.  IR is aware of his Asprin, Eliquis and being transfused.  He is scheduled for around 1 Pm today.  1200 PM BP down to 83/46 after first unit of blood, foley in and it looks like urine output is OK.  I have ask them to start the 2nd unit but I will give him a bolus while we wait.  LOS: 1 day     Larry [name] 01/20/2015

## 2015-01-20 NOTE — Progress Notes (Signed)
Pt's n/v not relieved with current medication; notified MD on-call and received orders for NGT. Informed pt that NG tube would need to be inserted; pt refused. Explained indication for tube insertion (persistent nausea/vomiting); pt understood reason for tube, but still refusing. Will attempt to insert NG tube later.

## 2015-01-20 NOTE — Sedation Documentation (Signed)
500 cc NS bolus given, NS IV infusion increased for 250cc/hr to 500 cc/hr.

## 2015-01-20 NOTE — Care Management Note (Signed)
Case Management Note  Patient Details  Name: ORLYN ODONOGHUE MRN: [id number] Date of Birth: Oct 18, 1945  Subjective/Objective:  69 y/o m admitted w/Sepsis, cholecystitis. From home.                  Action/Plan:Transfer to SDU.   Expected Discharge Date:   (unknown)               Expected Discharge Plan:  Home/Self Care  In-House Referral:     Discharge planning Services  CM Consult  Post Acute Care Choice:    Choice offered to:     DME Arranged:    DME Agency:     HH Arranged:    HH Agency:     Status of Service:  In process, will continue to follow  Medicare Important Message Given:    Date Medicare IM Given:    Medicare IM give by:    Date Additional Medicare IM Given:    Additional Medicare Important Message give by:     If discussed at Long Length of Stay Meetings, dates discussed:    Additional Comments:  Lanier Clam, RN 01/20/2015, 12:00 PM

## 2015-01-20 NOTE — Procedures (Signed)
Successful GB FOSSA ABSCESS DRAIN INSERTION NO COMP STABLE THICK DARK BLOODY FLD ASPIRATED Suspect infected hematoma GS/cx sent No comp

## 2015-01-20 NOTE — Consult Note (Signed)
Consult follow up  Larry [name] ZOX:[id number] DOB: April 05, 1946 DOA: 01/19/2015 PCP: [name]  68 y/o ? H/oh MI S/P stent 2014 [place] [facility], DM TY2, HTN, complicated gallbladder history He was initially diagnosed/15 with mild peptic ulcer pain and CT abdomen pelvis was negative for any acute abnormality although gallstones were noted-he is found to be in H fibrillation and was on Elliquis given Chadvasc score of 4--patient was given Carafate for upper abdominal pain Patient was admitted, started on broad-spectrum antibiotics inclusive of Zosyn and had a perk cholecystotomy/20 he was restarted on Elliquis and was discharged on 4/25 Cardiology cleared him for surgery on 6/30 He underwent on 7/19 laparoscopic cholecystectomy with cholangiogram intraoperative and a 19 French Jackson-Pratt drain was placed There was no evidence of any biliary leak into the drain and he was felt to be stable for discharge He returns with 18 hours of abdominal pain starting around 13:00 01/18/15 it intensified He went to [facility] and had a CT scan which showed gas + blood in the GB fossa and was found hypotensive c BP in the 70/40 on arrival to [facility] Found to have multiple lab anomalies including Hemoglobin ? Creat ?   Past medical history-As per Problem list Chart reviewed as below-   Consultants:  Gen surgery  Procedures:  CT scan performed at outside hospital  Antibiotics:  Vancomycin and Zosyn started 8/3  Vancomycin discontinued 8/4   Subjective  Anxious and no other issues currently No cp No n/v now but had ~ 4 episodes overnight No sob No fever nor chills    Objective    Interim History:   Telemetry: Sinus tach, no A. fib   Objective: Filed Vitals:   01/20/15 0109 01/20/15 0120 01/20/15 0456 01/20/15 0508  BP: 82/38  Pulse: 88  87   Temp: 97.7 F (36.5 C)  98 F (36.7 C)   TempSrc: Oral  Oral   Resp: 20  18   Height:      Weight:        SpO2: 94%  96%     Intake/Output Summary (Last 24 hours) at 01/20/15 8119 Last data filed at 01/20/15 1478  Gross per 24 hour  Intake 3093.75 ml  Output    225 ml  Net 2868.75 ml    Exam:  General: eomi ncat-anxious Cardiovascular: s1 s 2no m/r/g Respiratory: clear no added sound Abdomen: tender LOwer R and L lower quadrant.  Some rebound Skinno le edema Neuro intact  Data Reviewed: Basic Metabolic Panel:  Recent Labs Lab 01/19/15 1613 01/20/15 0320  NA 138 138  K 5.5* 5.8*  CL 106 109  CO2 25 23  GLUCOSE 276* 239*  BUN 27* 32*  CREATININE 1.80* 2.31*  CALCIUM 7.9* 7.6*  MG 1.8  --    Liver Function Tests:  Recent Labs Lab 01/19/15 1613  AST 20  ALT 15*  ALKPHOS 63  BILITOT 1.4*  PROT 5.8*  ALBUMIN 2.8*    Recent Labs Lab 01/19/15 1613  LIPASE 14*   No results for input(s): AMMONIA in the last 168 hours. CBC:  Recent Labs Lab 01/19/15 1613 01/20/15 0320  WBC 22.5* 19.7*  NEUTROABS 20.4*  --   HGB 9.2* 7.6*  HCT 28.3* 23.3*  MCV 94.3 95.5  PLT 426* 370   Cardiac Enzymes: No results for input(s): CKTOTAL, CKMB, CKMBINDEX, TROPONINI in the last 168 hours. BNP: Invalid input(s): POCBNP CBG:  Recent Labs Lab 01/19/15 1622 01/19/15  2028 01/20/15 0036 01/20/15 0451  GLUCAP 256* 263* 266* 195*    No results found for this or any previous visit (from the past 240 hour(s)).   Studies:              All Imaging reviewed and is as per above notation   Scheduled Meds: . sodium chloride   Intravenous Once  . feeding supplement  1 Container Oral TID BM  . insulin aspart  0-15 Units Subcutaneous 6 times per day  . insulin glargine  15 Units Subcutaneous QHS  . ondansetron  8 mg Oral BID  . pantoprazole (PROTONIX) IV  40 mg Intravenous Q12H  . piperacillin-tazobactam (ZOSYN)  IV  3.375 g Intravenous Q8H  . vancomycin  1,250 mg Intravenous Q24H   Continuous Infusions: . sodium chloride 125 mL/hr at 01/20/15 1610      Assessment/Plan:   Shock septic/circulatory with some end organ dysfunction-improving rapidly in the emergency room Patient has responded well to IV fluids and we will continue them according to sepsis protocol with sepsis bundle continue IV fluids at 125 cc per hour as normal saline Would continue broad-spectrum antibiotics that already has been started by general surgery with Vanc and Zosyn.-->  narrow rapidly to just Zosyn monotherapy 8/4  Complicated gallbladder pathology (as per general surgery note and imaging as per their note as well) -Deferred to general surgery -It appears IR drain will be placed to help decompress the area of blood/other issue and patient may potentially require IR input regarding removal of clot and maybe intra-drain cholangiogram?   Diabetes mellitus with underlying diabetic nephropathy -We will keep him nothing by mouth until procedure and cut his 40 units of Lantus daily to 15 units daily. He'll be placed on a every 4 hourly sliding scale coverage -Hold off on Lyriglutide 18 mg, metformin 500 twice a day for now   HTN (hypertension) with episodic hypotension -2/2 to pain meds, Anemia of acute blood loss -Discontinue lisinopril 40 mg twice a day [unusual dose] -Continue amlodipine 10 mg daily  Symptomatic anemia 2/2 to Bleed in abdomen -2 units packed red blood cells to be transfused 01/20/15 -General surgery to address interventions along with interventional radiology -? may need diagnostic laparoscopy to visualize directly area that may be bleeding? -Await input from IR  AKI/acute renal failure 2/2 to ABLA as well as Lisinopril use and metfromin use +Hyperkalemia -Strict I's and O's -Place Foley catheter -EKG from the emergency room significant for sinus rhythm with PAC, no T-wave elevations suggestive hyperkalemia PR interval 0.16, left anterior fascicular block and no ST-T wave changes suggestive of ischemia  Paroxysmal atrial  fibrillation -Not on any rate controlling agent however rate controlled to some extent by himself -Given bleed risk into fossa would hold Elliquis 5 mg twice a day for now and also hold off on aspirin 81 mg   History CAD-status post stent 2 years ago 2014 -Would hold off on aspirin for above reasons and then restart once cleared by surgeon -Probably needs to be on both anticoagulation and antiplatelets agent although this does increase risk of bleeding and the risks and benefits of this will have to be discussed with the patient  Bipolar -Continue Celexa 40 daily -Very anxious and very oriented as well as given nonpharmacological modalities to manage -Might require low-dose Ativan  Hyperlipidemia -In setting of ASCVD as well as prior CAD agree with high-dose Lipitor 80 daily daily at bedtime  Appt with PCP: Will be Requested on  discharge Code Status: Full code Family Communication: family +, discussed with wife in detail at bedside Disposition Plan: Probably will need snf-social worker consulted DVT prophylaxis: SCD Consultants:   Pleas Koch, MD  [facility] Pager [phone] 01/20/2015, 8:12 AM    LOS: 1 day
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
## 2015-01-20 NOTE — Sedation Documentation (Addendum)
Moderate Sedation contraindicated d/t hypotension.

## 2015-01-20 NOTE — Consult Note (Signed)
PULMONARY / CRITICAL CARE MEDICINE   Name: Larry [name] MRN: [id number] DOB: 1946/05/08    ADMISSION DATE:  01/19/2015 CONSULTATION DATE:  8/4  REFERRING MD :  [name]  CHIEF COMPLAINT:  Septic shock   INITIAL PRESENTATION:  69 year old male w/ CAF on elliquis recently underwent lap chole on 7/14. Was discharged to home w/drain. Drain removed about a week before presentation to ER. Admitted directly from [facility] on 8/3 w/ septic shock which was likely due to infected hematoma in the GB fossa. PCCM asked to see after new perc drain placed on 8/4, when he remained hypotensive.   STUDIES:    SIGNIFICANT EVENTS: Admitted 8/3 w/ abd pain and hypotension  8/4 hypotensive over-night. Went for [facility] drain. Found what looked like old infected hematoma. PCCM asked to see post-op for shock  HISTORY OF PRESENT ILLNESS:   69 y/o who was initially admitted in 10/05/2012 with Sepsis, hypotension, acute renal failure and acute cholecystitis. He had been place on Eliquis for his AF by the [place]. He had an MI 2 years ago and did not follow up with his cardiologist. He was acutely ill and underwent a percutaneous drain placement on 10/06/14 by IR. He went home on 10/11/14. He was readmitted on 12/30/14 and underwent laparoscopic cholecystectomy.  He was left with a drain in place and the site had "SNOW' placed in the GB fossa for hemostasis after the procedure. Drain was removed the weel prior to admit, there was some brusing at the site. He presented to [facility] on 8/3 w/ cc: abd pain. Work up there shows BP 87/40, temp 99.8. A new CT scan was obtained that showed gas and blood in the GB fossa. He was given IVFs, empiric abx and admitted to the medical ward. He was hypotensive over night. SBP in 70-80s. Went to [facility] for perc drain which drained old bloody appearing hematoma which was noted to be concerning for infection. His creatinine doubled over the first night. PCCM was asked to see post-op given  persistent hypotension.   PAST MEDICAL HISTORY :   has a past medical history of Hypertension; Atrial fibrillation (10-01-14); Coronary artery disease; Obesity; Multiple fractures; Cholecystostomy care; Diabetes mellitus without complication; and MI (myocardial infarction).  has past surgical history that includes Revision total hip arthroplasty (Left); knee replacement (Left); cardiac stent; Cardiac catheterization; Ankle surgery (Left); Joint replacement; and Cholecystectomy (N/A, 01/04/2015). Prior to Admission medications   Medication Sig Start Date End Date Taking? Authorizing Provider  allopurinol (ZYLOPRIM) 300 MG tablet Take 300 mg by mouth daily.   Yes Historical Provider, MD  amLODipine (NORVASC) 10 MG tablet Take 10 mg by mouth every morning.    Yes Historical Provider, MD  apixaban (ELIQUIS) 5 MG TABS tablet Take 1 tablet (5 mg total) by mouth 2 (two) times daily. 10/01/14  Yes April Palumbo, MD  aspirin 81 MG tablet Take 81 mg by mouth daily.   Yes Historical Provider, MD  atorvastatin (LIPITOR) 80 MG tablet Take 80 mg by mouth at bedtime.    Yes Historical Provider, MD  Cholecalciferol (VITAMIN D PO) Take 2,000 Units by mouth daily.    Yes Historical Provider, MD  citalopram (CELEXA) 40 MG tablet Take 40 mg by mouth every morning.    Yes Historical Provider, MD  furosemide (LASIX) 40 MG tablet Take 40 mg by mouth every morning.    Yes Historical Provider, MD  glucose 4 GM chewable tablet Chew 1 tablet by mouth  as needed for low blood sugar (only if BS IS BELOW 70).   Yes Historical Provider, MD  insulin glargine (LANTUS) 100 UNIT/ML injection Inject 40 Units into the skin at bedtime.   Yes Historical Provider, MD  magnesium oxide (MAG-OX) 400 MG tablet Take 400 mg by mouth 2 (two) times daily.    Yes Historical Provider, MD  metFORMIN (GLUCOPHAGE) 500 MG tablet Take 500 mg by mouth 2 (two) times daily with a meal.   Yes Historical Provider, MD  oxyCODONE-acetaminophen (PERCOCET/ROXICET)  5-325 MG per tablet Take 1-2 tablets by mouth every 4 (four) hours as needed for moderate pain. 01/05/15  Yes [name], MD  pantoprazole (PROTONIX) 40 MG tablet Take 40 mg by mouth daily.   Yes Historical Provider, MD  vitamin B-12 (CYANOCOBALAMIN) 500 MCG tablet Take 500 mcg by mouth daily.   Yes Historical Provider, MD  Liraglutide 18 MG/3ML SOPN Inject 1.2 mg into the skin daily. Pt states has been on hold since discharged 10-06-14 hospital visit    Historical Provider, MD  lisinopril (PRINIVIL,ZESTRIL) 40 MG tablet Take 40 mg by mouth 2 (two) times daily. On hold since 10-06-14    Historical Provider, MD  ondansetron (ZOFRAN) 8 MG tablet Take 8 mg by mouth 2 (two) times daily. Not taking-on hold form 10-06-14    Historical Provider, MD   No Known Allergies  FAMILY HISTORY:  indicated that his mother is alive. He indicated that his father is deceased.  SOCIAL HISTORY:  reports that he has never smoked. He does not have any smokeless tobacco history on file. He reports that he does not drink alcohol or use illicit drugs.  REVIEW OF SYSTEMS:   General: + rigors, + thirst, + fever HENT: No HA, sore throat, visual or hearing changes. PULM: denies SOB, cough, CP or wheeze. CARD: denies CP, light-headedness, palpitations, or swelling. ABD: marked abd discomfort right side, basically diffusely on right. GU: no issues. Neuro: pain as noted. ENDO: + fevers.  VITAL SIGNS: Temp:  [97.5 F (36.4 C)-99.8 F (37.7 C)] 98.2 F (36.8 C) (08/04 1345) Pulse Rate:  [70-104] 95 (08/04 1400) Resp:  [14-30] 18 (08/04 1400) BP: (61-128)/(13-58) 94/46 mmHg (08/04 1400) SpO2:  [86 %-100 %] 98 % (08/04 1400) Weight:  [118.6 kg (261 lb 7.5 oz)-121.8 kg (268 lb 8.3 oz)] 121.8 kg (268 lb 8.3 oz) (08/04 1320) 2 liters HEMODYNAMICS:   VENTILATOR SETTINGS:   INTAKE / OUTPUT:  Intake/Output Summary (Last 24 hours) at 01/20/15 1422 Last data filed at 01/20/15 1400  Gross per 24 hour  Intake 3698.75 ml  Output     225 ml  Net 3473.75 ml    PHYSICAL EXAMINATION: General:  Acutely ill appearing white male, resting in the bed. Has intermittent rigors  Neuro:  Awake, alert, no focal def  HEENT:  NCAT, MM pale  Cardiovascular:  rrr Lungs:  Clear and w/o accessory muscle use  Abdomen:  Tender to palp. Perc drain on right w/ dark old bloody drainage.  Musculoskeletal:  Intact  Skin:  Intact   LABS:  CBC  Recent Labs Lab 01/19/15 1613 01/20/15 0320  WBC 22.5* 19.7*  HGB 9.2* 7.6*  HCT 28.3* 23.3*  PLT 426* 370   Coag's  Recent Labs Lab 01/19/15 1613  APTT 35  INR 1.46   BMET  Recent Labs Lab 01/19/15 1613 01/20/15 0320  NA 138 138  K 5.5* 5.8*  CL 106 109  CO2 25 23  BUN 27* 32*  CREATININE 1.80* 2.31*  GLUCOSE 276* 239*   Electrolytes  Recent Labs Lab 01/19/15 1613 01/20/15 0320  CALCIUM 7.9* 7.6*  MG 1.8  --    Sepsis Markers No results for input(s): LATICACIDVEN, PROCALCITON, O2SATVEN in the last 168 hours. ABG No results for input(s): PHART, PCO2ART, PO2ART in the last 168 hours. Liver Enzymes  Recent Labs Lab 01/19/15 1613  AST 20  ALT 15*  ALKPHOS 63  BILITOT 1.4*  ALBUMIN 2.8*   Cardiac Enzymes No results for input(s): TROPONINI, PROBNP in the last 168 hours. Glucose  Recent Labs Lab 01/19/15 1622 01/19/15 2028 01/20/15 0036 01/20/15 0451 01/20/15 0758 01/20/15 1147  GLUCAP 256* 263* 266* 195* 192* 160*    Imaging Dg Chest Port 1 View  01/19/2015   CLINICAL DATA:  Increasing abdominal pain since laparoscopic cholecystectomy 01/04/2015. Right upper and lower quadrant pain. Nausea, shortness of breath and weakness.  EXAM: PORTABLE CHEST - 1 VIEW  COMPARISON:  10/06/2014  FINDINGS: Patient is rotated to the right. Lungs are hypoinflated with minimal right perihilar and medial right basilar opacification which may be due to atelectasis or infection. No evidence of effusion or pneumothorax. Cardiomediastinal silhouette and remainder of the  exam is unchanged.  IMPRESSION: Mild opacification in the right perihilar region and medial right base which may be due to atelectasis or infection.   Electronically Signed   By: Elberta Fortis M.D.   On: 01/19/2015 16:19   Elevated Right HD. Basilar atx   ASSESSMENT / PLAN:  PULMONARY OETT A: No acute but at risk for ALI  P:   Supplemental oxygen for sats > 92%  CARDIOVASCULAR CVL A:  Severe sepsis/septic shock--> H/o PAF; CHADVASC score 4; currently NSR  Prior NSTEMI  P:  Hold all antihypertensives Transfuse for HGB >8 Ensure 30 ml/kg (giving one more liter of NS to achieve this) Stat lactic acid Central line IF lactic elevated and still hypotensive  Ck cortisol  RENAL A:   AKI in setting of shock, Ace-I  Mild hyperkalemia  P:   Maximize volume status  Hold antihypertensives MAP goal > 65 Repeat stat chemistry now IF K has normalized we will switch NS to LR to avoid post-resuscitation hyperchloremia   GASTROINTESTINAL A:   Infected hematoma s/p cholecystectomy. Now s/p CT guided Perc drain   P:   Diet and drain per surgery and IR services   HEMATOLOGIC A:   Anemia.. Likely a mix of blood loss and prolonged critical illness. Currently no evidence of active bleeding. Getting his second of 2 units.  P:  Hold anticoagulation  F/u CBC s/p transfusion  Place SCDs  INFECTIOUS A:   Severe sepsis/septic shock: presume source is infected hematoma from recent Cholecystectomy  S/p Perc GB fossa site drain 8/4 P:   Zosyn 8/3>>> Abscess 8/4>>> ENDOCRINE A:  Hyperglycemia w/ DM  P:   SSI Hold oral agents   NEUROLOGIC A:  Pain At risk for delirium  P:   RASS goal: na Supportive care Analgesia as indicated    FAMILY  - Updates:   - Inter-disciplinary family meet or Palliative Care meeting due by:  8/11  TODAY'S SUMMARY: septic shock from infected hematoma after recent cholecystectomy. Appears under-resuscitated. Will add additional volume to assure  30 ml/kg, complete current transfusion and ck lactate. Also need to add vanc given recent surgery. May need central access if BP remains low or lactate does not clear.   Simonne Martinet ACNP-BC [facility] Pulmonary/Critical Care Pager # [phone]  OR # [phone] if no answer   01/20/2015, 2:22 PM  STAFF NOTE: I, Rory Percy, MD FACP have personally reviewed patient's available data, including medical history, events of note, physical examination and test results as part of my evaluation. I have discussed with resident/NP and other care providers such as pharmacist, RN and RRT. In addition, I personally evaluated patient and elicited key findings of: no distress, reduced BS rt base, abdo distended, no r/g, drain placed, concern started as hematoma on anticoagulation leading to infected fossa , now sepsis, at risk occult septic shock, provide total 30 cc/kg volume stat, LA stat, if LA greater 4 then will place line, cvp goal to 12, repeat LA will be ordered, fluids have been started, see re assessment of perfusion, add vanc to zosyn, if he declines in any manor, add mycofungin, ensure [facility] sent, ensure fossa culture sent, re evaluate rt base in am on pcxr, concern with K just back 6.0, add bicarb drip, add kayxlate, follow up K important, bmet q8h The patient is critically ill with multiple organ systems failure and requires high complexity decision making for assessment and support, frequent evaluation and titration of therapies, application of advanced monitoring technologies and extensive interpretation of multiple databases.   Critical Care Time devoted to patient care services described in this note is 35 Minutes. This time reflects time of care of this signee: Rory Percy, MD FACP. This critical care time does not reflect procedure time, or teaching time or supervisory time of PA/NP/Med student/Med Resident etc but could involve care discussion time. Rest per NP/medical resident whose note is  outlined above and that I agree with   Mcarthur Rossetti. Tyson Alias, MD, FACP Pgr: [phone] [facility] 01/20/2015 3:37 PM

## 2015-01-20 NOTE — Progress Notes (Signed)
Bp is better now 122/38 Sinus tachy K+ 6.0 with repeat 5.8, getting kayexalate Drain is bloody I just emptied 60 ml all bloody some clot in the bulb.  He has a central line and is getting more fluid.  Appreciate CCM assist.

## 2015-01-21 ENCOUNTER — Inpatient Hospital Stay (HOSPITAL_COMMUNITY): Payer: Medicare Other

## 2015-01-21 DIAGNOSIS — J80 Acute respiratory distress syndrome: Secondary | ICD-10-CM

## 2015-01-21 DIAGNOSIS — R0603 Acute respiratory distress: Secondary | ICD-10-CM

## 2015-01-21 LAB — COMPREHENSIVE METABOLIC PANEL
ALBUMIN: 2.7 g/dL — AB (ref 3.5–5.0)
ALBUMIN: 2.7 g/dL — AB (ref 3.5–5.0)
ALK PHOS: 66 U/L (ref 38–126)
ALT: 43 U/L (ref 17–63)
ALT: 42 U/L (ref 17–63)
ANION GAP: 3 — AB (ref 5–15)
ANION GAP: 4 — AB (ref 5–15)
AST: 45 U/L — AB (ref 15–41)
AST: 44 U/L — ABNORMAL HIGH (ref 15–41)
Alkaline Phosphatase: 62 U/L (ref 38–126)
BILIRUBIN TOTAL: 1.8 mg/dL — AB (ref 0.3–1.2)
BUN: 37 mg/dL — AB (ref 6–20)
BUN: 37 mg/dL — ABNORMAL HIGH (ref 6–20)
CALCIUM: 7.2 mg/dL — AB (ref 8.9–10.3)
CALCIUM: 7 mg/dL — AB (ref 8.9–10.3)
CHLORIDE: 112 mmol/L — AB (ref 101–111)
CO2: 24 mmol/L (ref 22–32)
CO2: 22 mmol/L (ref 22–32)
CREATININE: 2.15 mg/dL — AB (ref 0.61–1.24)
Chloride: 111 mmol/L (ref 101–111)
Creatinine, Ser: 2.1 mg/dL — ABNORMAL HIGH (ref 0.61–1.24)
GFR calc Af Amer: 36 mL/min — ABNORMAL LOW (ref >60)
GFR calc non Af Amer: 31 mL/min — ABNORMAL LOW (ref >60)
GFR, EST AFRICAN AMERICAN: 35 mL/min — AB (ref >60)
GFR, EST NON AFRICAN AMERICAN: 30 mL/min — AB (ref >60)
GLUCOSE: 211 mg/dL — AB (ref 65–99)
Glucose, Bld: 201 mg/dL — ABNORMAL HIGH (ref 65–99)
POTASSIUM: 4.6 mmol/L (ref 3.5–5.1)
Potassium: 4.8 mmol/L (ref 3.5–5.1)
Sodium: 138 mmol/L (ref 135–145)
Sodium: 138 mmol/L (ref 135–145)
Total Bilirubin: 1.6 mg/dL — ABNORMAL HIGH (ref 0.3–1.2)
Total Protein: 5.9 g/dL — ABNORMAL LOW (ref 6.5–8.1)
Total Protein: 5.7 g/dL — ABNORMAL LOW (ref 6.5–8.1)

## 2015-01-21 LAB — BASIC METABOLIC PANEL
ANION GAP: 7 (ref 5–15)
Anion gap: 6 (ref 5–15)
BUN: 36 mg/dL — ABNORMAL HIGH (ref 6–20)
BUN: 36 mg/dL — ABNORMAL HIGH (ref 6–20)
CO2: 24 mmol/L (ref 22–32)
CO2: 21 mmol/L — AB (ref 22–32)
Calcium: 7.4 mg/dL — ABNORMAL LOW (ref 8.9–10.3)
Calcium: 7.4 mg/dL — ABNORMAL LOW (ref 8.9–10.3)
Chloride: 114 mmol/L — ABNORMAL HIGH (ref 101–111)
Chloride: 109 mmol/L (ref 101–111)
Creatinine, Ser: 2.27 mg/dL — ABNORMAL HIGH (ref 0.61–1.24)
Creatinine, Ser: 1.9 mg/dL — ABNORMAL HIGH (ref 0.61–1.24)
GFR calc Af Amer: 32 mL/min — ABNORMAL LOW (ref >60)
GFR, EST AFRICAN AMERICAN: 40 mL/min — AB (ref >60)
GFR, EST NON AFRICAN AMERICAN: 35 mL/min — AB (ref >60)
GFR, EST NON AFRICAN AMERICAN: 28 mL/min — AB (ref >60)
Glucose, Bld: 208 mg/dL — ABNORMAL HIGH (ref 65–99)
Glucose, Bld: 182 mg/dL — ABNORMAL HIGH (ref 65–99)
POTASSIUM: 4.1 mmol/L (ref 3.5–5.1)
POTASSIUM: 4.7 mmol/L (ref 3.5–5.1)
Sodium: 141 mmol/L (ref 135–145)
Sodium: 140 mmol/L (ref 135–145)

## 2015-01-21 LAB — BLOOD GAS, ARTERIAL
Acid-base deficit: 2 mmol/L (ref 0.0–2.0)
BICARBONATE: 22.3 meq/L (ref 20.0–24.0)
O2 Content: 2 L/min
O2 Saturation: 92.4 %
PCO2 ART: 38.8 mmHg (ref 35.0–45.0)
PO2 ART: 68.6 mmHg — AB (ref 80.0–100.0)
Patient temperature: 98.6
TCO2: 20.1 mmol/L (ref 0–100)
pH, Arterial: 7.378 (ref 7.350–7.450)

## 2015-01-21 LAB — GLUCOSE, CAPILLARY
GLUCOSE-CAPILLARY: 174 mg/dL — AB (ref 65–99)
Glucose-Capillary: 145 mg/dL — ABNORMAL HIGH (ref 65–99)
Glucose-Capillary: 171 mg/dL — ABNORMAL HIGH (ref 65–99)
Glucose-Capillary: 174 mg/dL — ABNORMAL HIGH (ref 65–99)
Glucose-Capillary: 176 mg/dL — ABNORMAL HIGH (ref 65–99)
Glucose-Capillary: 196 mg/dL — ABNORMAL HIGH (ref 65–99)

## 2015-01-21 LAB — CBC WITH DIFFERENTIAL/PLATELET
Basophils Absolute: 0 10*3/uL (ref 0.0–0.1)
Basophils Relative: 0 % (ref 0–1)
EOS ABS: 0 10*3/uL (ref 0.0–0.7)
EOS PCT: 0 % (ref 0–5)
HEMATOCRIT: 23 % — AB (ref 39.0–52.0)
HEMOGLOBIN: 7.6 g/dL — AB (ref 13.0–17.0)
LYMPHS PCT: 4 % — AB (ref 12–46)
Lymphs Abs: 0.7 10*3/uL (ref 0.7–4.0)
MCH: 30.4 pg (ref 26.0–34.0)
MCHC: 33 g/dL (ref 30.0–36.0)
MCV: 92 fL (ref 78.0–100.0)
MONOS PCT: 9 % (ref 3–12)
Monocytes Absolute: 1.6 10*3/uL — ABNORMAL HIGH (ref 0.1–1.0)
NEUTROS ABS: 14.4 10*3/uL — AB (ref 1.7–7.7)
NEUTROS PCT: 87 % — AB (ref 43–77)
Platelets: 301 10*3/uL (ref 150–400)
RBC: 2.5 MIL/uL — AB (ref 4.22–5.81)
RDW: 16.1 % — ABNORMAL HIGH (ref 11.5–15.5)
WBC: 16.8 10*3/uL — AB (ref 4.0–10.5)

## 2015-01-21 LAB — PREPARE RBC (CROSSMATCH)

## 2015-01-21 MED ORDER — CHLORHEXIDINE GLUCONATE 0.12 % MT SOLN
OROMUCOSAL | Status: AC
  Filled 2015-01-21: qty 15

## 2015-01-21 MED ORDER — VANCOMYCIN HCL 10 G IV SOLR
1500.0000 mg | INTRAVENOUS | Status: DC
  Administered 2015-01-21 – 2015-01-22 (×2): 1500 mg via INTRAVENOUS
  Filled 2015-01-21 (×5): qty 1500

## 2015-01-21 MED ORDER — SODIUM CHLORIDE 0.9 % IV SOLN: Freq: Once | INTRAVENOUS | Status: DC

## 2015-01-21 MED ORDER — CETYLPYRIDINIUM CHLORIDE 0.05 % MT LIQD
7.0000 mL | Freq: Two times a day (BID) | OROMUCOSAL | Status: DC
  Administered 2015-01-21 – 2015-01-28 (×15): 7 mL via OROMUCOSAL

## 2015-01-21 MED ORDER — FUROSEMIDE 10 MG/ML IJ SOLN
INTRAMUSCULAR | Status: AC
  Administered 2015-01-21: 40 mg via INTRAVENOUS
  Filled 2015-01-21: qty 4

## 2015-01-21 MED ORDER — CHLORHEXIDINE GLUCONATE 0.12% ORAL RINSE (MEDLINE KIT)
15.0000 mL | Freq: Two times a day (BID) | OROMUCOSAL | Status: DC
  Administered 2015-01-21 – 2015-01-28 (×14): 15 mL via OROMUCOSAL

## 2015-01-21 MED ORDER — FUROSEMIDE 10 MG/ML IJ SOLN
40.0000 mg | Freq: Once | INTRAMUSCULAR | Status: AC
  Administered 2015-01-21: 40 mg via INTRAVENOUS

## 2015-01-21 MED ORDER — DEXTROSE-NACL 5-0.2 % IV SOLN
INTRAVENOUS | Status: DC
  Administered 2015-01-21: 50 mL/h via INTRAVENOUS
  Administered 2015-01-22: 21:00:00 via INTRAVENOUS
  Administered 2015-01-24: 50 mL/h via INTRAVENOUS
  Administered 2015-01-24: via INTRAVENOUS
  Administered 2015-01-26: 10 mL via INTRAVENOUS
  Administered 2015-01-29: 125 mL/h via INTRAVENOUS

## 2015-01-21 NOTE — Care Management Important Message (Signed)
Important Message  Patient Details  Name: Larry [name] MRN: [id number] Date of Birth: Mar 15, 1946   Medicare Important Message Given:  Yes-second notification given    Haskell Flirt 01/21/2015, 12:12 PMImportant Message  Patient Details  Name: Larry [name] MRN: [id number] Date of Birth: [birth year]   Medicare Important Message Given:  Yes-second notification given    Haskell Flirt 01/21/2015, 12:12 PM
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
## 2015-01-21 NOTE — Progress Notes (Signed)
Subjective: He is wheezing some and anxious about being SOB, CCM is holding PO's right now.  His BP is much better.  Objective: Vital signs in last 24 hours: Temp:  [98.1 F (36.7 C)-98.9 F (37.2 C)] 98.6 F (37 C) (08/05 0800) Pulse Rate:  [94-111] 104 (08/05 0900) Resp:  [9-38] 24 (08/05 0900) BP: (61-128)/(13-97) 112/52 mmHg (08/05 0900) SpO2:  [89 %-100 %] 97 % (08/05 0900) Weight:  [121.8 kg (268 lb 8.3 oz)] 121.8 kg (268 lb 8.3 oz) (08/04 1320) Last BM Date: 01/21/15 3.7 liters IV yesterday Urine 650 Drain 150 BM x 2 Afebrile, BP is better, still tachycardic Creatinine is stable, trending down WBC still up but trending down H/H is where it was yesterday before we transfused 2 units of PRBC.     Intake/Output from previous day: 08/04 0701 - 08/05 0700 In: 5036.3 [I.V.:3708.8; Blood:650; IV Piggyback:562.5] Out: 800 [Urine:650; Drains:150] Intake/Output this shift: Total I/O In: 470 [I.V.:400; Other:20; IV Piggyback:50] Out: 75 [Urine:75]  General appearance: alert and ANXIOUS, wheezing some. Resp: some wheezing, sats are good right now GI: soft, sore, drainage from perc drain is bloody, I tried to break up some clots in the tubing,   Lab Results:   Recent Labs  01/20/15 2108 01/21/15 0650  WBC 18.5* 16.8*  HGB 7.8* 7.6*  HCT 24.2* 23.0*  PLT 319 301    BMET  Recent Labs  01/21/15 0650 01/21/15 0750  NA 138 138  K 4.8 4.6  CL 111 112*  CO2 24 22  GLUCOSE 211* 201*  BUN 37* 37*  CREATININE 2.15* 2.10*  CALCIUM 7.2* 7.0*   PT/INR  Recent Labs  01/19/15 1613  LABPROT 17.8*  INR 1.46     Recent Labs Lab 01/19/15 1613 01/21/15 0650 01/21/15 0750  AST 20 45* 44*  ALT 15* 43 42  ALKPHOS 63 66 62  BILITOT 1.4* 1.8* 1.6*  PROT 5.8* 5.9* 5.7*  ALBUMIN 2.8* 2.7* 2.7*     Lipase     Component Value Date/Time   LIPASE 14* 01/19/2015 1613     Studies/Results: Dg Chest Port 1 View  01/21/2015   CLINICAL DATA:  Shortness of  breath, atrial fibrillation, hypertension, diabetes mellitus, history coronary artery disease post MI  EXAM: PORTABLE CHEST - 1 VIEW  COMPARISON:  Portable exam 0810 hours compared to 01/20/2015  FINDINGS: RIGHT jugular central venous catheter with tip projecting over cavoatrial junction or high RIGHT atrium.  Borderline enlargement of cardiac silhouette.  Mediastinal contours and pulmonary vascularity normal.  Persistent elevation of RIGHT diaphragm with RIGHT basilar atelectasis.  Lungs otherwise clear.  No pleural effusion or pneumothorax.  Diffuse osseous demineralization.  Chronic RIGHT rotator cuff tear.  IMPRESSION: Persistent RIGHT basilar atelectasis.   Electronically Signed   By: Ulyses Southward M.D.   On: 01/21/2015 08:33   Dg Chest Port 1 View  01/20/2015   CLINICAL DATA:  Sepsis  EXAM: PORTABLE CHEST - 1 VIEW  COMPARISON:  10/01/2014  FINDINGS: New right IJ central line. The tip is at the level of the central right atrium. Hypoventilation exacerbates inferior tip positioning.  Streaky lower lung opacities, greater on the right is confirmed by CT earlier today, with small right pleural effusion.  Right upper quadrant percutaneous drain noted.  No pneumothorax.  Stable mild cardiomegaly.  IMPRESSION: 1. New right IJ central line with tip at the right atrial level. No pneumothorax. 2. Low lung volumes with bibasilar atelectasis or pneumonia.   Electronically Signed  By: Marnee Spring M.D.   On: 01/20/2015 17:08   Dg Chest Port 1 View  01/19/2015   CLINICAL DATA:  Increasing abdominal pain since laparoscopic cholecystectomy 01/04/2015. Right upper and lower quadrant pain. Nausea, shortness of breath and weakness.  EXAM: PORTABLE CHEST - 1 VIEW  COMPARISON:  10/06/2014  FINDINGS: Patient is rotated to the right. Lungs are hypoinflated with minimal right perihilar and medial right basilar opacification which may be due to atelectasis or infection. No evidence of effusion or pneumothorax. Cardiomediastinal  silhouette and remainder of the exam is unchanged.  IMPRESSION: Mild opacification in the right perihilar region and medial right base which may be due to atelectasis or infection.   Electronically Signed   By: Elberta Fortis M.D.   On: 01/19/2015 16:19   Ct Image Guided Drainage Percut Cath  Peritoneal Retroperit  01/20/2015   CLINICAL DATA:  GALLBLADDER FOSSA AIR-FLUID COLLECTION, SEPSIS, CONCERN FOR INTRA-ABDOMINAL ABSCESS  EXAM: CT GUIDED DRAINAGE CATHETER INSERTION OF THE GALLBLADDER FOSSA ABSCESS  ANESTHESIA/SEDATION: NO SEDATION.  PATIENT IS HYPOTENSIVE.  1% lidocaine locally.  PROCEDURE: The procedure risks, benefits, and alternatives were explained to the patient. Questions regarding the procedure were encouraged and answered. The patient understands and consents to the procedure.  The right upper quadrant was prepped with ChloraPrepin a sterile fashion, and a sterile drape was applied covering the operative field. A sterile gown and sterile gloves were used for the procedure. Local anesthesia was provided with 1% Lidocaine.  Previous imaging reviewed. Patient positioned supine. Noncontrast localization CT performed. The heterogeneous hyperdense air collection in the gallbladder fossa was localized. Under sterile conditions and local anesthesia, an 18 gauge 15 cm access needle was advanced from an anterior oblique intercostal approach into the gallbladder fossa collection. Needle position confirmed with CT. Guidewire inserted followed by tract dilatation to insert a 12 [place] drain. Drain catheter position confirmed with CT. Syringe aspiration yielded dark thick bloody fluid, suspect infected hematoma. Sample sent for Gram stain and culture. Catheter secured with a Prolene suture and connected to external suction bulb. Sterile dressing applied to the site.  Complications: None immediate  FINDINGS: Imaging confirms CT-guided needle access of the gallbladder fossa heterogeneous air-fluid collection for drain  insertion. Infected hematoma suspected. Gram stain and culture pending.  IMPRESSION: Successful CT-guided gallbladder fossa abscess drain insertion as above.   Electronically Signed   By: Judie Petit.  Shick M.D.   On: 01/20/2015 15:46    Medications: . antiseptic oral rinse  7 mL Mouth Rinse q12n4p  . chlorhexidine gluconate  15 mL Mouth Rinse BID  . insulin aspart  0-15 Units Subcutaneous 6 times per day  . insulin glargine  15 Units Subcutaneous QHS  . ondansetron  8 mg Oral BID  . pantoprazole (PROTONIX) IV  40 mg Intravenous Q12H  . piperacillin-tazobactam (ZOSYN)  IV  3.375 g Intravenous Q8H  . vancomycin  1,250 mg Intravenous Q24H   . dextrose 5 % and 0.2 % NaCl     Assessment/Plan Sepsis with shock Intraabdominal abscess, peritonitis S/p laparoscopic cholecystectomy 12/30/14 with drain placement S/p sepsis, acute cholecystitis, shock, acute renal failure, with IR cholecystostomy, 09/2014. PAF on chronic anticoagulation - Eliquis CAD with MI/DES stent 2 years ago Acute on chronic renal insuffiencey AODM ID Hypertension Anemia  Gout Multiple vertebral fractures Obesity Antibiotics: Day 2 Zosyn/Vancomycin DVT: On Eliquis last dose 01/18/15/SCD no anticoagulant currently.    Plan:  Appreciate CCM and Dr. Pandora Leiter assistance.   I will defer to CCM on  blood and respiratory issues. Vancomycin restarted last PM.  Culture no growth so far:    ABUNDANT WBC PRESENT,BOTH PMN AND MONONUCLEAR  NO SQUAMOUS EPITHELIAL CELLS SEEN  NO ORGANISMS SEEN             LOS: 2 days    Simonne Boulos 01/21/2015

## 2015-01-21 NOTE — Consult Note (Signed)
Consult follow up  GRIER VU UJW:[id number] DOB: [birth year] DOA: 01/19/2015 PCP: [name]  [name], DM TY2, HTN, complicated gallbladder history He was initially diagnosed/15 with mild peptic ulcer pain and CT abdomen pelvis was negative for any acute abnormality although gallstones were noted-he is found to be in H fibrillation and was on Elliquis given Chadvasc score of 4--patient was given Carafate for upper abdominal pain Patient was admitted, started on broad-spectrum antibiotics inclusive of Zosyn and had a perk cholecystotomy/20 he was restarted on Elliquis and was discharged on 4/25 Cardiology cleared him for surgery on 6/30 He underwent on 7/19 laparoscopic cholecystectomy with cholangiogram intraoperative and a 19 French Jackson-Pratt drain was placed There was no evidence of any biliary leak into the drain and he was felt to be stable for discharge He returns with 18 hours of abdominal pain starting around 13:00 01/18/15 it intensified He went to [facility] and had a CT scan which showed gas + blood in the GB fossa and was found hypotensive c BP in the 70/40 on arrival to [facility] [place] Found to have multiple lab anomalies including Hemoglobin ? Creat ?  Admitted initially to telemetry floor Subsequently noted to be persistently hypotensive with progressive ? hemoglobin. Felt Elliquis and Aspirin etiology for hypovolemic shock.  Started on broadspectrum Abx on admission as well for suspected GB infectious process IR performed flush of GB fossa and took cultures on 8/5 CCM consulted given persistent hypotension and concern for occult sepsis R IJ placed 8/4   Past medical history-As per Problem list Chart reviewed as below-   Consultants:  Gen surgery  IR  CCM  Procedures:  CT scan performed at outside hospital  CXR's  Antibiotics:  Vancomycin and Zosyn started 8/3  Vancomycin discontinued 8/4   Subjective    Anxious but alert breathin seems worse and visibly working to breath No cp Abd pain marginally better Nursing concerned re: possible PTSD Tolerating some clear liquids    Objective    Interim History:   Telemetry: CVP is 12   Objective: Filed Vitals:   01/21/15 0400 01/21/15 0500 01/21/15 0600 01/21/15 0700  BP: 121/59 105/56 101/50 94/58  Pulse: 102 108 101 103  Temp:      TempSrc:      Resp: 22 22 21 23   Height:      Weight:      SpO2: 94% 95% 97% 99%    Intake/Output Summary (Last 24 hours) at 01/21/15 0810 Last data filed at 01/21/15 0600  Gross per 24 hour  Intake 4901.25 ml  Output    800 ml  Net 4101.25 ml    Exam:  General: eomi ncat-anxious Cardiovascular: s1 s 2 o m/r/g Respiratory: clear no added sound Abdomen: tender LOwer R and L lower quadrant.  Some rebound Skinno le edema Neuro intact  Data Reviewed: Basic Metabolic Panel:  Recent Labs Lab 01/19/15 1613 01/20/15 0320 01/20/15 1450 01/20/15 1550 01/21/15 0050 01/21/15 0650  NA 138 138 142 140 141 138  K 5.5* 5.8* 6.0* 5.8* 4.7 4.8  CL 106 109 112* 112* 114* 111  CO2 25 23 24  21* 21* 24  GLUCOSE 276* 239* 183* 171* 208* 211*  BUN 27* 32* 36* 36* 36* 37*  CREATININE 1.80* 2.31* 2.64* 2.33* 2.27* 2.15*  CALCIUM 7.9* 7.6* 7.7* 7.5* 7.4* 7.2*  MG 1.8  --   --   --   --   --  Liver Function Tests:  Recent Labs Lab 01/19/15 1613 01/21/15 0650  AST 20 45*  ALT 15* 43  ALKPHOS 63 66  BILITOT 1.4* 1.8*  PROT 5.8* 5.9*  ALBUMIN 2.8* 2.7*    Recent Labs Lab 01/19/15 1613  LIPASE 14*   No results for input(s): AMMONIA in the last 168 hours. CBC:  Recent Labs Lab 01/19/15 1613 01/20/15 0320 01/20/15 2108 01/21/15 0650  WBC 22.5* 19.7* 18.5* 16.8*  NEUTROABS 20.4*  --  16.2* 14.4*  HGB 9.2* 7.6* 7.8* 7.6*  HCT 28.3* 23.3* 24.2* 23.0*  MCV 94.3 95.5 91.3 92.0  PLT 426* 370 319 301   Cardiac Enzymes: No results for input(s): CKTOTAL, CKMB, CKMBINDEX,  TROPONINI in the last 168 hours. BNP: Invalid input(s): POCBNP CBG:  Recent Labs Lab 01/20/15 1147 01/20/15 1550 01/20/15 2017 01/21/15 0009 01/21/15 0323  GLUCAP 160* 168* 161* 176* 196*    Recent Results (from the past 240 hour(s))  MRSA PCR Screening     Status: None   Collection Time: 01/20/15  1:39 PM  Result Value Ref Range Status   MRSA by PCR NEGATIVE NEGATIVE Final    Comment:        The GeneXpert MRSA Assay (FDA approved for NASAL specimens only), is one component of a comprehensive MRSA colonization surveillance program. It is not intended to diagnose MRSA infection nor to guide or monitor treatment for MRSA infections. Performed at [facility]      Studies:              All Imaging reviewed and is as per above notation   Scheduled Meds: . sodium chloride   Intravenous Once  . furosemide  40 mg Intravenous Once  . insulin aspart  0-15 Units Subcutaneous 6 times per day  . insulin glargine  15 Units Subcutaneous QHS  . ondansetron  8 mg Oral BID  . pantoprazole (PROTONIX) IV  40 mg Intravenous Q12H  . piperacillin-tazobactam (ZOSYN)  IV  3.375 g Intravenous Q8H  . vancomycin  1,250 mg Intravenous Q24H   Continuous Infusions: . sodium chloride 125 mL/hr at 01/21/15 0600     Assessment/Plan:   Shock septic/circulatory with some end organ dysfunction-improving rapidly in the emergency room Lactic acid 1.6-->0.8 Continue Zosyn monotherapy Difficult situation-oriented  And alert but marginal BP and fluid overloaded by JVP and CVP consider low dose dopamine Defer to ccm--if repsiratory status doesn;t resolve, would  Consider Bipap short term Vanc and Zosyn.-->  narrow rapidly to just Zosyn monotherapy 8/4 lasix 40 x 1 given 01/21/15 am  Complicated gallbladder pathology (as per general surgery note and imaging as per their note as well) -Deferred to general surgery -It appears IR drain will be placed to help decompress the area of  blood/other issue and patient may potentially require IR input regarding removal of clot and maybe intra-drain cholangiogram? -he continues tio drop his hemoglobin -s./p 2 U PRBC4 -I have transfused him another unit 8/5/126   Diabetes mellitus with underlying diabetic nephropathy -We will keep him nothing by mouth until procedure and cut his 40 units of Lantus daily to 15 units daily. He'll be placed on a every 4 hourly sliding scale coverage -CBG's 161-196 -Hold off on Lyriglutide 18 mg, metformin 500 twice a day for now   HTN (hypertension) with episodic hypotension -2/2 to pain meds, Anemia of acute blood loss -Discontinue lisinopril 40 mg twice a day [unusual dose] -Continue amlodipine 10 mg daily  Symptomatic anemia 2/2 to Bleed  in abdomen -2 units packed red blood cells to be transfused 01/20/15 -General surgery to address interventions along with interventional radiology -? may need diagnostic laparoscopy to visualize directly area that may be bleeding?  AKI/acute renal failure 2/2 to ABLA as well as Lisinopril use and metfromin use +Hyperkalemia -Strict I's and O's -Place Foley catheter -EKG from the emergency room significant for sinus rhythm with PAC, no T-wave elevations suggestive hyperkalemia PR interval 0.16, left anterior fascicular block and no ST-T wave changes suggestive of ischemia -Hyperkalemia was noted on bmet 8/4 and patient was given Kayexalate x 1 -follow labs  Paroxysmal atrial fibrillation, [place] score ~ 4 -Not on any rate controlling agent however rate controlled to some extent by himself -Given bleed risk into fossa would hold Elliquis 5 mg twice a day for now and also hold off on aspirin 81 mg   History CAD-status post stent 2 years ago 2014 -Would hold off on aspirin for above reasons and then restart once cleared by surgeon -Probably needs to be on both anticoagulation and antiplatelets agent although this does increase risk of bleeding and the  risks and benefits of this will have to be discussed with the patient  Bipolar -Continue Celexa 40 daily -low-dose Ativan  Hyperlipidemia -In setting of ASCVD as well as prior CAD agree with high-dose Lipitor 80 daily daily at bedtime when able to take this  Appt with PCP:  Code Status: Full code Family Communication:  No family +,  Disposition Plan: Probably will need snf-social worker consulted DVT prophylaxis: SCD Consultants:   Pleas Koch, MD  [facility] Pager [phone] 01/21/2015, 8:10 AM    LOS: 2 days

## 2015-01-21 NOTE — Progress Notes (Signed)
PULMONARY / CRITICAL CARE MEDICINE   Name: Larry [name] MRN: [id number] DOB: 1945/09/05    ADMISSION DATE:  01/19/2015 CONSULTATION DATE:  8/4  REFERRING MD :  [name]  CHIEF COMPLAINT:  Septic shock   INITIAL PRESENTATION:  69 year old male w/ CAF on elliquis recently underwent lap chole on 7/14. Was discharged to home w/drain. Drain removed about a week before presentation to ER. Admitted directly from [facility] on 8/3 w/ septic shock which was likely due to infected hematoma in the GB fossa. PCCM asked to see after new perc drain placed on 8/4, when he remained hypotensive.   STUDIES:    SIGNIFICANT EVENTS: Admitted 8/3 w/ abd pain and hypotension  8/4 hypotensive over-night. Went for [facility] drain. Found what looked like old infected hematoma. PCCM asked to see post-op for shock 8/5 off pressors. WOB worse.   VITAL SIGNS: Temp:  [98.1 F (36.7 C)-98.9 F (37.2 C)] 98.6 F (37 C) (08/05 0800) Pulse Rate:  [94-111] 104 (08/05 0900) Resp:  [9-38] 24 (08/05 0900) BP: (61-128)/(13-97) 112/52 mmHg (08/05 0900) SpO2:  [89 %-100 %] 97 % (08/05 0900) Weight:  [121.8 kg (268 lb 8.3 oz)] 121.8 kg (268 lb 8.3 oz) (08/04 1320) 2 liters HEMODYNAMICS: CVP:  [8 mmHg-12 mmHg] 12 mmHg VENTILATOR SETTINGS:   INTAKE / OUTPUT:  Intake/Output Summary (Last 24 hours) at 01/21/15 0959 Last data filed at 01/21/15 2440  Gross per 24 hour  Intake 5476.25 ml  Output    875 ml  Net 4601.25 ml    PHYSICAL EXAMINATION: General:  Acutely ill appearing white male, resting in the bed. More SOB Neuro:  Awake, alert, no focal def  HEENT:  NCAT, MM pale  Cardiovascular:  rrr Lungs:  Clear w/ UAW wheeze  Abdomen:  Tender to palp. Perc drain on right w/ dark old bloody drainage.  Musculoskeletal:  Intact  Skin:  Intact   LABS:  CBC  Recent Labs Lab 01/20/15 0320 01/20/15 2108 01/21/15 0650  WBC 19.7* 18.5* 16.8*  HGB 7.6* 7.8* 7.6*  HCT 23.3* 24.2* 23.0*  PLT 370 319 301    Coag's  Recent Labs Lab 01/19/15 1613  APTT 35  INR 1.46   BMET  Recent Labs Lab 01/21/15 0050 01/21/15 0650 01/21/15 0750  NA 141 138 138  K 4.7 4.8 4.6  CL 114* 111 112*  CO2 21* 24 22  BUN 36* 37* 37*  CREATININE 2.27* 2.15* 2.10*  GLUCOSE 208* 211* 201*   Electrolytes  Recent Labs Lab 01/19/15 1613  01/21/15 0050 01/21/15 0650 01/21/15 0750  CALCIUM 7.9*  < > 7.4* 7.2* 7.0*  MG 1.8  --   --   --   --   < > = values in this interval not displayed. Sepsis Markers  Recent Labs Lab 01/20/15 1420 01/20/15 1550 01/20/15 1807  LATICACIDVEN 1.6 1.8 0.8   ABG  Recent Labs Lab 01/21/15 0809  PHART 7.378  PCO2ART 38.8  PO2ART 68.6*   Liver Enzymes  Recent Labs Lab 01/19/15 1613 01/21/15 0650 01/21/15 0750  AST 20 45* 44*  ALT 15* 43 42  ALKPHOS 63 66 62  BILITOT 1.4* 1.8* 1.6*  ALBUMIN 2.8* 2.7* 2.7*   Cardiac Enzymes No results for input(s): TROPONINI, PROBNP in the last 168 hours. Glucose  Recent Labs Lab 01/20/15 1147 01/20/15 1550 01/20/15 2017 01/21/15 0009 01/21/15 0323 01/21/15 0805  GLUCAP 160* 168* 161* 176* 196* 174*    Imaging Dg Chest [facility]  1 View  01/21/2015   CLINICAL DATA:  Shortness of breath, atrial fibrillation, hypertension, diabetes mellitus, history coronary artery disease post MI  EXAM: PORTABLE CHEST - 1 VIEW  COMPARISON:  Portable exam 0810 hours compared to 01/20/2015  FINDINGS: RIGHT jugular central venous catheter with tip projecting over cavoatrial junction or high RIGHT atrium.  Borderline enlargement of cardiac silhouette.  Mediastinal contours and pulmonary vascularity normal.  Persistent elevation of RIGHT diaphragm with RIGHT basilar atelectasis.  Lungs otherwise clear.  No pleural effusion or pneumothorax.  Diffuse osseous demineralization.  Chronic RIGHT rotator cuff tear.  IMPRESSION: Persistent RIGHT basilar atelectasis.   Electronically Signed   By: Ulyses Southward M.D.   On: 01/21/2015 08:33   Dg Chest  Port 1 View  01/20/2015   CLINICAL DATA:  Sepsis  EXAM: PORTABLE CHEST - 1 VIEW  COMPARISON:  10/01/2014  FINDINGS: New right IJ central line. The tip is at the level of the central right atrium. Hypoventilation exacerbates inferior tip positioning.  Streaky lower lung opacities, greater on the right is confirmed by CT earlier today, with small right pleural effusion.  Right upper quadrant percutaneous drain noted.  No pneumothorax.  Stable mild cardiomegaly.  IMPRESSION: 1. New right IJ central line with tip at the right atrial level. No pneumothorax. 2. Low lung volumes with bibasilar atelectasis or pneumonia.   Electronically Signed   By: Marnee Spring M.D.   On: 01/20/2015 17:08   Ct Image Guided Drainage Percut Cath  Peritoneal Retroperit  01/20/2015   CLINICAL DATA:  GALLBLADDER FOSSA AIR-FLUID COLLECTION, SEPSIS, CONCERN FOR INTRA-ABDOMINAL ABSCESS  EXAM: CT GUIDED DRAINAGE CATHETER INSERTION OF THE GALLBLADDER FOSSA ABSCESS  ANESTHESIA/SEDATION: NO SEDATION.  PATIENT IS HYPOTENSIVE.  1% lidocaine locally.  PROCEDURE: The procedure risks, benefits, and alternatives were explained to the patient. Questions regarding the procedure were encouraged and answered. The patient understands and consents to the procedure.  The right upper quadrant was prepped with ChloraPrepin a sterile fashion, and a sterile drape was applied covering the operative field. A sterile gown and sterile gloves were used for the procedure. Local anesthesia was provided with 1% Lidocaine.  Previous imaging reviewed. Patient positioned supine. Noncontrast localization CT performed. The heterogeneous hyperdense air collection in the gallbladder fossa was localized. Under sterile conditions and local anesthesia, an 18 gauge 15 cm access needle was advanced from an anterior oblique intercostal approach into the gallbladder fossa collection. Needle position confirmed with CT. Guidewire inserted followed by tract dilatation to insert a 12  [place] drain. Drain catheter position confirmed with CT. Syringe aspiration yielded dark thick bloody fluid, suspect infected hematoma. Sample sent for Gram stain and culture. Catheter secured with a Prolene suture and connected to external suction bulb. Sterile dressing applied to the site.  Complications: None immediate  FINDINGS: Imaging confirms CT-guided needle access of the gallbladder fossa heterogeneous air-fluid collection for drain insertion. Infected hematoma suspected. Gram stain and culture pending.  IMPRESSION: Successful CT-guided gallbladder fossa abscess drain insertion as above.   Electronically Signed   By: Judie Petit.  Shick M.D.   On: 01/20/2015 15:46   Elevated Right HD. Right > left patchy infiltrates ASSESSMENT / PLAN:  PULMONARY OETT A: Chronically elevated right HD Evolving patchy pulm infiltrates on right  Worsening acute hypoxic respiratory failure P:   Supplemental oxygen for sats > 92% Reflux precautions   CARDIOVASCULAR CVL A:  Severe sepsis/septic shock--> H/o PAF; CHADVASC score 4; currently NSR  Prior NSTEMI  P:  Hold all antihypertensives  CVP goal 8-12 Add levophed for MAP > 65  RENAL A:   AKI in setting of shock, Ace-I --> sCr seems to have hit a plateau.  Mild hyperkalemia now s/p Kay-exalate and bicarb infusion w/ improvement  P:   MAP goal > 65 Change MIVF to D5 1/2 NS at 2ml/hr; agree ok to stop bicarb Got lasix earlier today. Will need to repeat chemistry and watch BP. Hope BP will tolerate this  GASTROINTESTINAL A:   Infected hematoma s/p cholecystectomy. Now s/p CT guided Perc drain  GERD-->worse 8/5 w/ associated UAW wheeze P:   Diet and drain per surgery and IR services  Add PPI Reflux precautions  HEMATOLOGIC A:   Anemia.. Likely a mix of blood loss and prolonged critical illness. Currently no evidence of active bleeding.  S/p 2 units PRBC on 8/4 P:  Hold anticoagulation  Place SCDs Transfuse for Hbg < 7 -->will dc transfusion    INFECTIOUS A:   Severe sepsis/septic shock: presume source is infected hematoma from recent Cholecystectomy  S/p Perc GB fossa site drain 8/4 P:   Zosyn 8/3>>> vanc 8/3>>> Abscess 8/4>>> BCX2 8/4>>>  ENDOCRINE A:  Hyperglycemia w/ DM  P:   SSI Hold oral agents   NEUROLOGIC A:  Pain At risk for delirium  P:   RASS goal: n/a Supportive care Analgesia as indicated    FAMILY  - Updates:   - Inter-disciplinary family meet or Palliative Care meeting due by:  8/11  TODAY'S SUMMARY: septic shock from infected hematoma after recent cholecystectomy. Now volume resuscitated. Does have some increase WOB and mild patchy right infiltrates. Will back off on IVFs. Allow negative volume status IF able, and cont current abx. Have d/c'd transfusion. Think his persistent anemia is the result of hemodilution and not active bleeding  Simonne Martinet ACNP-BC [facility] Pulmonary/Critical Care Pager # [phone] OR # [phone] if no answer   01/21/2015 9:59 AM

## 2015-01-21 NOTE — Progress Notes (Signed)
Referring Physician(s): CCS  Chief Complaint: GB fossa abscess s/p perc drain by IR 8/4  Subjective: Per RN patient with wheezing and shortness of breath today and wife states the patient has had a bad day. Patient is currently sleeping.   Allergies: Review of patient's allergies indicates no known allergies.  Medications: Prior to Admission medications   Medication Sig Start Date End Date Taking? Authorizing Provider  allopurinol (ZYLOPRIM) 300 MG tablet Take 300 mg by mouth daily.   Yes Historical Provider, MD  amLODipine (NORVASC) 10 MG tablet Take 10 mg by mouth every morning.    Yes Historical Provider, MD  apixaban (ELIQUIS) 5 MG TABS tablet Take 1 tablet (5 mg total) by mouth 2 (two) times daily. 10/01/14  Yes April Palumbo, MD  aspirin 81 MG tablet Take 81 mg by mouth daily.   Yes Historical Provider, MD  atorvastatin (LIPITOR) 80 MG tablet Take 80 mg by mouth at bedtime.    Yes Historical Provider, MD  Cholecalciferol (VITAMIN D PO) Take 2,000 Units by mouth daily.    Yes Historical Provider, MD  citalopram (CELEXA) 40 MG tablet Take 40 mg by mouth every morning.    Yes Historical Provider, MD  furosemide (LASIX) 40 MG tablet Take 40 mg by mouth every morning.    Yes Historical Provider, MD  glucose 4 GM chewable tablet Chew 1 tablet by mouth as needed for low blood sugar (only if BS IS BELOW 70).   Yes Historical Provider, MD  insulin glargine (LANTUS) 100 UNIT/ML injection Inject 40 Units into the skin at bedtime.   Yes Historical Provider, MD  magnesium oxide (MAG-OX) 400 MG tablet Take 400 mg by mouth 2 (two) times daily.    Yes Historical Provider, MD  metFORMIN (GLUCOPHAGE) 500 MG tablet Take 500 mg by mouth 2 (two) times daily with a meal.   Yes Historical Provider, MD  oxyCODONE-acetaminophen (PERCOCET/ROXICET) 5-325 MG per tablet Take 1-2 tablets by mouth every 4 (four) hours as needed for moderate pain. 01/05/15  Yes [name], MD  pantoprazole (PROTONIX) 40 MG  tablet Take 40 mg by mouth daily.   Yes Historical Provider, MD  vitamin B-12 (CYANOCOBALAMIN) 500 MCG tablet Take 500 mcg by mouth daily.   Yes Historical Provider, MD  Liraglutide 18 MG/3ML SOPN Inject 1.2 mg into the skin daily. Pt states has been on hold since discharged 10-06-14 hospital visit    Historical Provider, MD  lisinopril (PRINIVIL,ZESTRIL) 40 MG tablet Take 40 mg by mouth 2 (two) times daily. On hold since 10-06-14    Historical Provider, MD  ondansetron (ZOFRAN) 8 MG tablet Take 8 mg by mouth 2 (two) times daily. Not taking-on hold form 10-06-14    Historical Provider, MD   Vital Signs: BP 124/55 mmHg  Pulse 99  Temp(Src) 98.6 F (37 C) (Oral)  Resp 27  Ht 5\' 6"  (1.676 m)  Wt 268 lb 8.3 oz (121.8 kg)  BMI 43.36 kg/m2  SpO2 95%  Physical Exam General: Sleeping, NAD Abd: Soft, perc drain intact bloody output and large clot within bulb-exchanged out for new JP bulb, 150cc/24 hrs  Imaging: Dg Chest Port 1 View  01/21/2015   CLINICAL DATA:  Shortness of breath, atrial fibrillation, hypertension, diabetes mellitus, history coronary artery disease post MI  EXAM: PORTABLE CHEST - 1 VIEW  COMPARISON:  Portable exam 0810 hours compared to 01/20/2015  FINDINGS: RIGHT jugular central venous catheter with tip projecting over cavoatrial junction or high RIGHT atrium.  Borderline enlargement of cardiac silhouette.  Mediastinal contours and pulmonary vascularity normal.  Persistent elevation of RIGHT diaphragm with RIGHT basilar atelectasis.  Lungs otherwise clear.  No pleural effusion or pneumothorax.  Diffuse osseous demineralization.  Chronic RIGHT rotator cuff tear.  IMPRESSION: Persistent RIGHT basilar atelectasis.   Electronically Signed   By: Ulyses Southward M.D.   On: 01/21/2015 08:33   Dg Chest Port 1 View  01/20/2015   CLINICAL DATA:  Sepsis  EXAM: PORTABLE CHEST - 1 VIEW  COMPARISON:  10/01/2014  FINDINGS: New right IJ central line. The tip is at the level of the central right atrium.  Hypoventilation exacerbates inferior tip positioning.  Streaky lower lung opacities, greater on the right is confirmed by CT earlier today, with small right pleural effusion.  Right upper quadrant percutaneous drain noted.  No pneumothorax.  Stable mild cardiomegaly.  IMPRESSION: 1. New right IJ central line with tip at the right atrial level. No pneumothorax. 2. Low lung volumes with bibasilar atelectasis or pneumonia.   Electronically Signed   By: Marnee Spring M.D.   On: 01/20/2015 17:08   Dg Chest Port 1 View  01/19/2015   CLINICAL DATA:  Increasing abdominal pain since laparoscopic cholecystectomy 01/04/2015. Right upper and lower quadrant pain. Nausea, shortness of breath and weakness.  EXAM: PORTABLE CHEST - 1 VIEW  COMPARISON:  10/06/2014  FINDINGS: Patient is rotated to the right. Lungs are hypoinflated with minimal right perihilar and medial right basilar opacification which may be due to atelectasis or infection. No evidence of effusion or pneumothorax. Cardiomediastinal silhouette and remainder of the exam is unchanged.  IMPRESSION: Mild opacification in the right perihilar region and medial right base which may be due to atelectasis or infection.   Electronically Signed   By: Elberta Fortis M.D.   On: 01/19/2015 16:19   Ct Image Guided Drainage Percut Cath  Peritoneal Retroperit  01/20/2015   CLINICAL DATA:  GALLBLADDER FOSSA AIR-FLUID COLLECTION, SEPSIS, CONCERN FOR INTRA-ABDOMINAL ABSCESS  EXAM: CT GUIDED DRAINAGE CATHETER INSERTION OF THE GALLBLADDER FOSSA ABSCESS  ANESTHESIA/SEDATION: NO SEDATION.  PATIENT IS HYPOTENSIVE.  1% lidocaine locally.  PROCEDURE: The procedure risks, benefits, and alternatives were explained to the patient. Questions regarding the procedure were encouraged and answered. The patient understands and consents to the procedure.  The right upper quadrant was prepped with ChloraPrepin a sterile fashion, and a sterile drape was applied covering the operative field. A sterile  gown and sterile gloves were used for the procedure. Local anesthesia was provided with 1% Lidocaine.  Previous imaging reviewed. Patient positioned supine. Noncontrast localization CT performed. The heterogeneous hyperdense air collection in the gallbladder fossa was localized. Under sterile conditions and local anesthesia, an 18 gauge 15 cm access needle was advanced from an anterior oblique intercostal approach into the gallbladder fossa collection. Needle position confirmed with CT. Guidewire inserted followed by tract dilatation to insert a 12 [place] drain. Drain catheter position confirmed with CT. Syringe aspiration yielded dark thick bloody fluid, suspect infected hematoma. Sample sent for Gram stain and culture. Catheter secured with a Prolene suture and connected to external suction bulb. Sterile dressing applied to the site.  Complications: None immediate  FINDINGS: Imaging confirms CT-guided needle access of the gallbladder fossa heterogeneous air-fluid collection for drain insertion. Infected hematoma suspected. Gram stain and culture pending.  IMPRESSION: Successful CT-guided gallbladder fossa abscess drain insertion as above.   Electronically Signed   By: Judie Petit.  Shick M.D.   On: 01/20/2015 15:46  Labs:  CBC:  Recent Labs  01/19/15 1613 01/20/15 0320 01/20/15 2108 01/21/15 0650  WBC 22.5* 19.7* 18.5* 16.8*  HGB 9.2* 7.6* 7.8* 7.6*  HCT 28.3* 23.3* 24.2* 23.0*  PLT 426* 370 319 301    COAGS:  Recent Labs  10/06/14 0445 01/19/15 1613  INR 1.07 1.46  APTT 34 35    BMP:  Recent Labs  01/20/15 1550 01/21/15 0050 01/21/15 0650 01/21/15 0750  NA 140 141 138 138  K 5.8* 4.7 4.8 4.6  CL 112* 114* 111 112*  CO2 21* 21* 24 22  GLUCOSE 171* 208* 211* 201*  BUN 36* 36* 37* 37*  CALCIUM 7.5* 7.4* 7.2* 7.0*  CREATININE 2.33* 2.27* 2.15* 2.10*  GFRNONAA 27* 28* 30* 31*  GFRAA 31* 32* 35* 36*    LIVER FUNCTION TESTS:  Recent Labs  12/31/14 1340 01/19/15 1613  01/21/15 0650 01/21/15 0750  BILITOT 1.3* 1.4* 1.8* 1.6*  AST 35 20 45* 44*  ALT 26 15* 43 42  ALKPHOS 84 63 66 62  PROT 6.9 5.8* 5.9* 5.7*  ALBUMIN 3.9 2.8* 2.7* 2.7*    Assessment and Plan: Sepsis, acute cholecystitis s/p perc chole drain 09/2014 S/p Lap cholecystectomy 7/19, drain removed 2 weeks ago Septic shock- Abdominal pain, hypotension, tachycardia, Leukocytosis and CT with GB fossa fluid collection, concern for abscess Acute renal failure, Cr trending down slowly  S/p GB fossa perc drain placed 8/4 with 150cc/24 hrs bloody output, Cx pending-no growth, wbc trending down 16.8 (18.5), afebrile Follow Cx, continue to flush perc drain TID and monitor daily output Plans per CCM/CCS   Signed: Berneta Levins 01/21/2015, 3:20 PM   I spent a total of 15 Minutes in face to face in clinical consultation/evaluation, greater than 50% of which was counseling/coordinating care for GB fossa abscess.

## 2015-01-22 ENCOUNTER — Inpatient Hospital Stay (HOSPITAL_COMMUNITY): Payer: Medicare Other

## 2015-01-22 LAB — COMPREHENSIVE METABOLIC PANEL
ALBUMIN: 2.8 g/dL — AB (ref 3.5–5.0)
ALT: 46 U/L (ref 17–63)
AST: 43 U/L — AB (ref 15–41)
Alkaline Phosphatase: 88 U/L (ref 38–126)
Anion gap: 8 (ref 5–15)
BUN: 31 mg/dL — ABNORMAL HIGH (ref 6–20)
CO2: 23 mmol/L (ref 22–32)
Calcium: 7.6 mg/dL — ABNORMAL LOW (ref 8.9–10.3)
Chloride: 109 mmol/L (ref 101–111)
Creatinine, Ser: 1.83 mg/dL — ABNORMAL HIGH (ref 0.61–1.24)
GFR calc non Af Amer: 36 mL/min — ABNORMAL LOW (ref >60)
GFR, EST AFRICAN AMERICAN: 42 mL/min — AB (ref >60)
Glucose, Bld: 184 mg/dL — ABNORMAL HIGH (ref 65–99)
Potassium: 4.2 mmol/L (ref 3.5–5.1)
Sodium: 140 mmol/L (ref 135–145)
Total Bilirubin: 1.5 mg/dL — ABNORMAL HIGH (ref 0.3–1.2)
Total Protein: 6.1 g/dL — ABNORMAL LOW (ref 6.5–8.1)

## 2015-01-22 LAB — CBC
HCT: 23.5 % — ABNORMAL LOW (ref 39.0–52.0)
HEMOGLOBIN: 7.5 g/dL — AB (ref 13.0–17.0)
MCH: 29.3 pg (ref 26.0–34.0)
MCHC: 31.9 g/dL (ref 30.0–36.0)
MCV: 91.8 fL (ref 78.0–100.0)
Platelets: 294 10*3/uL (ref 150–400)
RBC: 2.56 MIL/uL — ABNORMAL LOW (ref 4.22–5.81)
RDW: 15.6 % — ABNORMAL HIGH (ref 11.5–15.5)
WBC: 15 10*3/uL — ABNORMAL HIGH (ref 4.0–10.5)

## 2015-01-22 LAB — GLUCOSE, CAPILLARY
GLUCOSE-CAPILLARY: 222 mg/dL — AB (ref 65–99)
GLUCOSE-CAPILLARY: 230 mg/dL — AB (ref 65–99)
Glucose-Capillary: 158 mg/dL — ABNORMAL HIGH (ref 65–99)
Glucose-Capillary: 161 mg/dL — ABNORMAL HIGH (ref 65–99)
Glucose-Capillary: 155 mg/dL — ABNORMAL HIGH (ref 65–99)
Glucose-Capillary: 184 mg/dL — ABNORMAL HIGH (ref 65–99)

## 2015-01-22 LAB — PREPARE RBC (CROSSMATCH)

## 2015-01-22 MED ORDER — ALBUTEROL SULFATE (2.5 MG/3ML) 0.083% IN NEBU: 2.5000 mg | INHALATION_SOLUTION | Freq: Four times a day (QID) | RESPIRATORY_TRACT | Status: DC

## 2015-01-22 MED ORDER — CHLORHEXIDINE GLUCONATE 0.12 % MT SOLN
OROMUCOSAL | Status: AC
  Filled 2015-01-22: qty 15

## 2015-01-22 MED ORDER — FUROSEMIDE 10 MG/ML IJ SOLN: 20.0000 mg | Freq: Once | INTRAMUSCULAR | Status: DC

## 2015-01-22 MED ORDER — SODIUM CHLORIDE 0.9 % IV SOLN: Freq: Once | INTRAVENOUS | Status: DC

## 2015-01-22 MED ORDER — DIPHENHYDRAMINE HCL 50 MG/ML IJ SOLN: 12.5000 mg | Freq: Once | INTRAMUSCULAR | Status: DC

## 2015-01-22 MED ORDER — LEVALBUTEROL HCL 0.63 MG/3ML IN NEBU
0.6300 mg | INHALATION_SOLUTION | Freq: Four times a day (QID) | RESPIRATORY_TRACT | Status: DC
  Administered 2015-01-22 – 2015-01-27 (×23): 0.63 mg via RESPIRATORY_TRACT
  Filled 2015-01-22 (×22): qty 3

## 2015-01-22 MED ORDER — LEVALBUTEROL HCL 0.63 MG/3ML IN NEBU
0.6300 mg | INHALATION_SOLUTION | Freq: Four times a day (QID) | RESPIRATORY_TRACT | Status: DC | PRN
  Administered 2015-01-23 – 2015-02-09 (×5): 0.63 mg via RESPIRATORY_TRACT
  Filled 2015-01-22 (×6): qty 3

## 2015-01-22 NOTE — Progress Notes (Signed)
PULMONARY / CRITICAL CARE MEDICINE   Name: Larry [name] MRN: [id number] DOB: 08/31/1945    ADMISSION DATE:  01/19/2015 CONSULTATION DATE:  8/4  REFERRING MD :  [name]  CHIEF COMPLAINT:  Septic shock   INITIAL PRESENTATION:  69 year old male w/ CAF on elliquis recently underwent lap chole on 7/14. Was discharged to home w/drain. Drain removed about a week before presentation to ER. Admitted directly from [facility] on 8/3 w/ septic shock which was likely due to infected hematoma in the GB fossa. PCCM asked to see after new perc drain placed on 8/4, when he remained hypotensive.   STUDIES:  CT abdomen/Pelvis W/ 7/28 - Gas & fluid at cholecystectomy site w/o discrete abscess. Min ascites. Small right pleural eff.  SIGNIFICANT EVENTS: Admitted 8/3 w/ abd pain and hypotension  8/4 hypotensive over-night. Went for [facility] drain. Found what looked like old infected hematoma. PCCM asked to see post-op for shock 8/5 off pressors. WOB worse.  8/6 WOB & wheezing slightly worse. Progressing abdominal pain  SUBJECTIVE: Increased WOB. More wheezing per RN. Increasing abdominal pain. Patient & RN report BM yesterday. Goood UOP. Denies any chest pain but is having some pleurisy with deep inspiration vs upper abdominal pain.  ROS:  No fever, chills, or sweats. No nausea. No headache or vision changes.  VITAL SIGNS: Temp:  [98.5 F (36.9 C)-100.3 F (37.9 C)] 100.1 F (37.8 C) (08/06 0500) Pulse Rate:  [99-114] 109 (08/06 0600) Resp:  [19-39] 32 (08/06 0600) BP: (111-139)/(52-68) 114/55 mmHg (08/06 0600) SpO2:  [89 %-100 %] 98 % (08/06 0600) 2 liters HEMODYNAMICS: CVP:  [8 mmHg-62 mmHg] 8 mmHg VENTILATOR SETTINGS:   INTAKE / OUTPUT:  Intake/Output Summary (Last 24 hours) at 01/22/15 0807 Last data filed at 01/22/15 0727  Gross per 24 hour  Intake 2004.17 ml  Output   1470 ml  Net 534.17 ml    PHYSICAL EXAMINATION: General:  Appears uncomfortable. Mild distress. Awake &  alert. Neuro:  Oriented x3. Moving all 4 extremities equally.  HEENT:  No scleral icterus. MMM. No oral ulcers. Cardiovascular:  Tachy but regular rhythm. Sinus tach. No edema. Lungs:  Mildly decreased breath sounds bilateral bases. Mild end-expiratory wheezing. Speaking in nearly complete sentences. Moderately increased WOB on [place] oxygen. Abdomen:  RUQ perc drain in place. Diffusely tender to palp w/ peritonitis. Hypoactive BS. Protuberant. Musculoskeletal:  No joint effusions or deformities. Skin:  Warm & dry. No rash.  LABS:  CBC  Recent Labs Lab 01/20/15 2108 01/21/15 0650 01/22/15 0453  WBC 18.5* 16.8* 15.0*  HGB 7.8* 7.6* 7.5*  HCT 24.2* 23.0* 23.5*  PLT 319 301 294   Coag's  Recent Labs Lab 01/19/15 1613  APTT 35  INR 1.46   BMET  Recent Labs Lab 01/21/15 0750 01/21/15 1445 01/22/15 0453  NA 138 140 140  K 4.6 4.1 4.2  CL 112* 109 109  CO2 BUN 37* 36* 31*  CREATININE 2.10* 1.90* 1.83*  GLUCOSE 201* 182* 184*   Electrolytes  Recent Labs Lab 01/19/15 1613  01/21/15 0750 01/21/15 1445 01/22/15 0453  CALCIUM 7.9*  < > 7.0* 7.4* 7.6*  MG 1.8  --   --   --   --   < > = values in this interval not displayed. Sepsis Markers  Recent Labs Lab 01/20/15 1420 01/20/15 1550 01/20/15 1807  LATICACIDVEN 1.6 1.8 0.8   ABG  Recent Labs Lab 01/21/15 0809  PHART 7.378  PCO2ART 38.8  PO2ART 68.6*   Liver Enzymes  Recent Labs Lab 01/21/15 0650 01/21/15 0750 01/22/15 0453  AST 45* 44* 43*  ALT 43 42 46  ALKPHOS 66 62 88  BILITOT 1.8* 1.6* 1.5*  ALBUMIN 2.7* 2.7* 2.8*   Cardiac Enzymes No results for input(s): TROPONINI, PROBNP in the last 168 hours. Glucose  Recent Labs Lab 01/21/15 0805 01/21/15 1158 01/21/15 1621 01/21/15 2007 01/22/15 0101 01/22/15 0421  GLUCAP 174* 174* 171* 145* 158* 161*    Imaging Dg Chest Port 1 View  01/21/2015   CLINICAL DATA:  Shortness of breath, atrial fibrillation, hypertension, diabetes  mellitus, history coronary artery disease post MI  EXAM: PORTABLE CHEST - 1 VIEW  COMPARISON:  Portable exam 0810 hours compared to 01/20/2015  FINDINGS: RIGHT jugular central venous catheter with tip projecting over cavoatrial junction or high RIGHT atrium.  Borderline enlargement of cardiac silhouette.  Mediastinal contours and pulmonary vascularity normal.  Persistent elevation of RIGHT diaphragm with RIGHT basilar atelectasis.  Lungs otherwise clear.  No pleural effusion or pneumothorax.  Diffuse osseous demineralization.  Chronic RIGHT rotator cuff tear.  IMPRESSION: Persistent RIGHT basilar atelectasis.   Electronically Signed   By: Ulyses Southward M.D.   On: 01/21/2015 08:33   Elevated Right HD. Right > left patchy infiltrates ASSESSMENT / PLAN:  PULMONARY A: Chronically elevated right HD Evolving patchy pulm infiltrates on right  Acute Hypoxic Respiratory Failure  P:   Supplemental oxygen for sats > 92% Xopenex neb q6hr Reflux precautions   CARDIOVASCULAR CVL A:  Severe sepsis/septic shock--> H/o PAF; CHADVASC score 4; currently NSR  Prior NSTEMI   P:  EKG with no evidence of ischemia Hold all antihypertensives CVP goal 8-12 Add levophed for MAP > 65  RENAL A:   ARF - Improving Hyperkalemia - Resolved  P:   Monitor UOP Daily BUN/Creatinine MAP goal > 65 Continue MIVF to D5 1/2 NS at 82ml/hr Holding Lasix diuresis at this time  GASTROINTESTINAL A:   Infected hematoma s/p cholecystectomy. Now s/p CT guided Perc drain  GERD-->worse 8/5 w/ associated U[employer] wheeze  P:   Diet and drain per surgery and IR services  Protonix IV Reflux precautions  HEMATOLOGIC A:   Anemia - Likely a mix of blood loss and prolonged critical illness. Currently no evidence of active bleeding.   P:  S/p 2 units PRBC on 8/4 Trend Hgb daily with CBC Hold anticoagulation  SCDs Transfuse for Hbg < 7   INFECTIOUS A:   Severe sepsis/septic shock - presume source is infected hematoma  from recent Cholecystectomy  S/p Perc GB fossa site drain 8/4 - minimal output  P:   Trending leukocytosis Monitoring for fever  Zosyn 8/3>>> vanc 8/3>>>  Abscess 8/4>>> BCX2 8/4>>>  ENDOCRINE A: DM  P:   SSI Accuchecks q4hr Hold oral agents   NEUROLOGIC A:  Abdominal Pain/Peritonitis - increasing At risk for delirium   P:   RASS goal: n/a Supportive care Fentanyl IV prn   FAMILY  - Updates: Wife not at bedside at the time of my exam.  - Inter-disciplinary family meet or Palliative Care meeting due by:  8/11  TODAY'S SUMMARY: Sepsis likely from abdominal source with cultures pending. Now has wheezing on exam that's mild. Surgery at bedside and evaluating abdominal pain. Renal function & leukocytosis improving.  I have spent a total of 31 minutes of critical care time today caring for this patient, reviewing his EMR, and discussing the plan of care with  his nurse and Dr. Gerrit Friends.  Donna Christen Jamison Neighbor, M.D. [facility] Pulmonary & Critical Care Pager:  [phone] After 3pm or if no response, call [phone]  01/22/2015 8:07 AM

## 2015-01-22 NOTE — Progress Notes (Signed)
Notified CCM and [facility] about status of patient. Patient respiratory status is worsening. Requiring more oxygen and increased tachypnea. Lungs are diminished, wheezy and patient has stridor. Abdomen is more pronounced. Patient looks ashen and jaundice. Patient is also more lethargic. Nurse expressed concerns about patient condition and also giving patient blood products since respiratory status so diminished. Nurse worried about possibly having to intubate due to respiratory status. Per [facility] no lasix but hold blood products, prn xopenex. No other interventions at this time. Will continue to monitor

## 2015-01-22 NOTE — Progress Notes (Signed)
Notes and chart reviewed  Events since 8/5 noted with ? abd pain and discomfort  Ct shows possible hemorrhage and interval increase in blood in abdomen Note from Dr. Doristine Johns reviewed  Patient Verbalizing fair RN reports has been in increasing abd pain over today 1 stool today-small No cp  BP 93/51 mmHg  Pulse 113  Temp(Src) 99 F (37.2 C) (Oral)  Resp 25  Ht  (1.676 m)  Wt 121.8 kg (268 lb 8.3 oz)  BMI 43.36 kg/m2  SpO2 91%   O/e ? WOB "pain was 10/10 yesterday-it is much worse today" s 1s2 tachy Clinically clear no added sound Distended abd-tender c gaurding le's have good pulses-cap refill intact No swelling    As CCM is seeing the patient and consulting-I will follow peripherally for gen med issues until hemodynamically stable.  Pleas Koch, MD [facility] [phone]

## 2015-01-22 NOTE — Progress Notes (Signed)
General Surgery [facility]  Subjective: Patient in bed, awakens to voice, appropriate responses, mildly dyspneic.  Two BM's today, abdominal pain somewhat improved, mainly in RUQ.  Objective: Vital signs in last 24 hours: Temp:  [98.2 F (36.8 C)-100.3 F (37.9 C)] 98.2 F (36.8 C) (08/06 2000) Pulse Rate:  [105-119] 107 (08/06 2100) Resp:  [23-40] 34 (08/06 2100) BP: (87-131)/(44-69) 92/44 mmHg (08/06 2100) SpO2:  [87 %-98 %] 87 % (08/06 2100) Last BM Date: 01/21/15  Intake/Output from previous day: 08/05 0701 - 08/06 0700 In: 2134.2 [P.O.:200; I.V.:1149.2; IV Piggyback:650] Out: 1545 [Urine:1525; Drains:20] Intake/Output this shift: Total I/O In: 660 [I.V.:150; Other:10; IV Piggyback:500] Out: -   Physical Exam: HEENT - sclerae clear, mucous membranes moist Neck - soft Chest - few wheezes, shallow, rapid Abdomen - obese, soft, tender RUQ, less so LUQ and lower abdomen; BS present; JP drain with serous fluid  Lab Results:   Recent Labs  01/21/15 0650 01/22/15 0453  WBC 16.8* 15.0*  HGB 7.6* 7.5*  HCT 23.0* 23.5*  PLT 301 294   BMET  Recent Labs  01/21/15 1445 01/22/15 0453  NA 140 140  K 4.1 4.2  CL 109 109  CO2 24 23  GLUCOSE 182* 184*  BUN 36* 31*  CREATININE 1.90* 1.83*  CALCIUM 7.4* 7.6*   PT/INR No results for input(s): LABPROT, INR in the last 72 hours. Comprehensive Metabolic Panel:    Component Value Date/Time   NA 140 01/22/2015 0453   NA 140 01/21/2015 1445   K 4.2 01/22/2015 0453   K 4.1 01/21/2015 1445   CL 109 01/22/2015 0453   CL 109 01/21/2015 1445   CO2 23 01/22/2015 0453   CO2 24 01/21/2015 1445   BUN 31* 01/22/2015 0453   BUN 36* 01/21/2015 1445   CREATININE 1.83* 01/22/2015 0453   CREATININE 1.90* 01/21/2015 1445   GLUCOSE 184* 01/22/2015 0453   GLUCOSE 182* 01/21/2015 1445   CALCIUM 7.6* 01/22/2015 0453   CALCIUM 7.4* 01/21/2015 1445   AST 43* 01/22/2015 0453   AST 44* 01/21/2015 0750   ALT  46 01/22/2015 0453   ALT 42 01/21/2015 0750   ALKPHOS 88 01/22/2015 0453   ALKPHOS 62 01/21/2015 0750   BILITOT 1.5* 01/22/2015 0453   BILITOT 1.6* 01/21/2015 0750   PROT 6.1* 01/22/2015 0453   PROT 5.7* 01/21/2015 0750   ALBUMIN 2.8* 01/22/2015 0453   ALBUMIN 2.7* 01/21/2015 0750    Studies/Results: Ct Abdomen Pelvis Wo Contrast  01/22/2015   CLINICAL DATA:  RIGHT lower quadrant and LEFT lower quadrant pain for 2 days, abscess check, post CT-guided abscess drainage  EXAM: CT ABDOMEN AND PELVIS WITHOUT CONTRAST  TECHNIQUE: Multidetector CT imaging of the abdomen and pelvis was performed following the standard protocol without IV contrast. Oral contrast was not administered. Sagittal and coronal MPR images reconstructed from axial data set.  COMPARISON:  01/19/2015  FINDINGS: Bibasilar pleural effusions and atelectasis greater on RIGHT.  Scattered atherosclerotic calcifications including coronary arteries.  Percutaneous drainage catheter identified within the large abscess containing gas and fluid located at the gallbladder fossa.  Collection has increased in size since the previous exam, currently 10.0 x 10.2 x 9.6 cm, previously  5.8 x 9.3 x 6.1 cm, portions now higher in attenuation question interval hemorrhage.  Additionally, a new higher attenuation collection is seen surrounding the inferior aspect of the RIGHT lobe of the liver, containing higher attenuation posteriorly in inferiorly and lower in attenuation anteriorly and superiorlylikely  hemoperitoneum with hematocrit layering, measuring 11.4 x 9.9 x ~7 cm in maximal dimensions.  Increased ascites.  Single tiny focus of gas identified between the liver and stomach image 29, nonspecific in the setting of percutaneous drain placement 2 days prior.  Spleen, pancreas, kidneys, and adrenal glands stable.  Bowel wall thickening of the splenic flexure and distal ascending colon again identified.  Stomach and remaining bowel loops unremarkable.   Scattered atherosclerotic calcifications.  No mass, adenopathy, additional free air, or hernia.  Scattered atherosclerotic calcifications.  Significant beam hardening artifacts from RIGHT hip prosthesis.  No acute osseous findings.  Significant scattered subcutaneous edema at both flanks.  IMPRESSION: Post percutaneous drainage 's of a gallbladder fossa abscess collection, collection now larger and higher in attenuation than on the previous study question interval hemorrhage.  Additionally, increased flow void with high density layering noted perihepatic compatible with increased intraperitoneal hemorrhage.  Increased ascites.  Mild increase in RIGHT pleural effusion and basilar atelectasis.  Findings called to Dr. Gerrit Friends on 01/22/2015 at 1704 hr.   Electronically Signed   By: Ulyses Southward M.D.   On: 01/22/2015 17:06   Dg Chest Port 1 View  01/21/2015   CLINICAL DATA:  Shortness of breath, atrial fibrillation, hypertension, diabetes mellitus, history coronary artery disease post MI  EXAM: PORTABLE CHEST - 1 VIEW  COMPARISON:  Portable exam 0810 hours compared to 01/20/2015  FINDINGS: RIGHT jugular central venous catheter with tip projecting over cavoatrial junction or high RIGHT atrium.  Borderline enlargement of cardiac silhouette.  Mediastinal contours and pulmonary vascularity normal.  Persistent elevation of RIGHT diaphragm with RIGHT basilar atelectasis.  Lungs otherwise clear.  No pleural effusion or pneumothorax.  Diffuse osseous demineralization.  Chronic RIGHT rotator cuff tear.  IMPRESSION: Persistent RIGHT basilar atelectasis.   Electronically Signed   By: Ulyses Southward M.D.   On: 01/21/2015 08:33    Anti-infectives: Anti-infectives    Start     Dose/Rate Route Frequency Ordered Stop   01/21/15 2000  vancomycin (VANCOCIN) 1,500 mg in sodium chloride 0.9 % 500 mL IVPB     1,500 mg 250 mL/hr over 120 Minutes Intravenous Every 24 hours 01/21/15 1048     01/20/15 2000  vancomycin (VANCOCIN) 1,250 mg  in sodium chloride 0.9 % 250 mL IVPB  Status:  Discontinued     1,250 mg 166.7 mL/hr over 90 Minutes Intravenous Every 24 hours 01/19/15 1945 01/20/15 0843   01/20/15 2000  vancomycin (VANCOCIN) 1,250 mg in sodium chloride 0.9 % 250 mL IVPB  Status:  Discontinued     1,250 mg 166.7 mL/hr over 90 Minutes Intravenous Every 24 hours 01/20/15 1519 01/21/15 1048   01/20/15 0000  piperacillin-tazobactam (ZOSYN) IVPB 3.375 g     3.375 g 12.5 mL/hr over 240 Minutes Intravenous Every 8 hours 01/19/15 1946     01/19/15 1715  vancomycin (VANCOCIN) 2,500 mg in sodium chloride 0.9 % 500 mL IVPB     2,500 mg 250 mL/hr over 120 Minutes Intravenous  Once 01/19/15 1707 01/19/15 2159   01/19/15 1715  piperacillin-tazobactam (ZOSYN) IVPB 3.375 g     3.375 g 100 mL/hr over 30 Minutes Intravenous  Once 01/19/15 1707 01/19/15 1900      Assessment & Plans: Status post cholecystectomy, post op abscess in gallbladder fossa  IV Zosyn  CT scan today reviewed - drain appears well positioned  WBC improved, not acidotic, Hgb stable but low, renal function improved slightly  Family at bedside.  Discussed possible need for  operative intervention but no obvious "target" for exploration at this point.  Abscess should be able to be managed by perc drain and IV abx's.  No sign of worsening sepsis at this point.  Pulmonary/CCM and medical service monitoring closely.  Trying to avoid intubation.  Will continue to monitor closely.  Await AM labs.  Velora Heckler, MD, [facility] Office: [phone]   Nayah Lukens Judie Petit 01/22/2015

## 2015-01-22 NOTE — Progress Notes (Signed)
Patient ID: Larry [name], male   DOB: 08/24/1945, 69 y.o.   MRN: [id number]  General Surgery [facility]  Subjective: Patient anxious, wife at bedside.  Patient seen and discussed with ICU nurse and CCM physician at bedside.  Complains of abdominal pain.  Wants to have clear liquids today.  Objective: Vital signs in last 24 hours: Temp:  [98.5 F (36.9 C)-100.3 F (37.9 C)] 100.1 F (37.8 C) (08/06 0500) Pulse Rate:  [99-114] 109 (08/06 0600) Resp:  [19-39] 32 (08/06 0600) BP: (111-139)/(52-68) 114/55 mmHg (08/06 0600) SpO2:  [89 %-100 %] 98 % (08/06 0600) Last BM Date: 01/21/15  Intake/Output from previous day: 08/05 0701 - 08/06 0700 In: 2134.2 [P.O.:200; I.V.:1149.2; IV Piggyback:650] Out: 1545 [Urine:1525; Drains:20] Intake/Output this shift: Total I/O In: 5 [Other:5] Out: -   Physical Exam: HEENT - sclerae clear, mucous membranes moist Neck - soft Chest - few wheezes bilaterally Cor - RRR Abdomen - soft, obese; rare BS present; drain RUQ with small serous output; mild diffuse tenderness; surgical wounds healing without complication Ext - no edema, non-tender Neuro - alert & oriented, no focal deficits  Lab Results:   Recent Labs  01/21/15 0650 01/22/15 0453  WBC 16.8* 15.0*  HGB 7.6* 7.5*  HCT 23.0* 23.5*  PLT 301 294   BMET  Recent Labs  01/21/15 1445 01/22/15 0453  NA 140 140  K 4.1 4.2  CL 109 109  CO2 24 23  GLUCOSE 182* 184*  BUN 36* 31*  CREATININE 1.90* 1.83*  CALCIUM 7.4* 7.6*   PT/INR  Recent Labs  01/19/15 1613  LABPROT 17.8*  INR 1.46   Comprehensive Metabolic Panel:    Component Value Date/Time   NA 140 01/22/2015 0453   NA 140 01/21/2015 1445   K 4.2 01/22/2015 0453   K 4.1 01/21/2015 1445   CL 109 01/22/2015 0453   CL 109 01/21/2015 1445   CO2 23 01/22/2015 0453   CO2 24 01/21/2015 1445   BUN 31* 01/22/2015 0453   BUN 36* 01/21/2015 1445   CREATININE 1.83* 01/22/2015 0453   CREATININE 1.90*  01/21/2015 1445   GLUCOSE 184* 01/22/2015 0453   GLUCOSE 182* 01/21/2015 1445   CALCIUM 7.6* 01/22/2015 0453   CALCIUM 7.4* 01/21/2015 1445   AST 43* 01/22/2015 0453   AST 44* 01/21/2015 0750   ALT 46 01/22/2015 0453   ALT 42 01/21/2015 0750   ALKPHOS 88 01/22/2015 0453   ALKPHOS 62 01/21/2015 0750   BILITOT 1.5* 01/22/2015 0453   BILITOT 1.6* 01/21/2015 0750   PROT 6.1* 01/22/2015 0453   PROT 5.7* 01/21/2015 0750   ALBUMIN 2.8* 01/22/2015 0453   ALBUMIN 2.7* 01/21/2015 0750    Studies/Results: Dg Chest Port 1 View  01/21/2015   CLINICAL DATA:  Shortness of breath, atrial fibrillation, hypertension, diabetes mellitus, history coronary artery disease post MI  EXAM: PORTABLE CHEST - 1 VIEW  COMPARISON:  Portable exam 0810 hours compared to 01/20/2015  FINDINGS: RIGHT jugular central venous catheter with tip projecting over cavoatrial junction or high RIGHT atrium.  Borderline enlargement of cardiac silhouette.  Mediastinal contours and pulmonary vascularity normal.  Persistent elevation of RIGHT diaphragm with RIGHT basilar atelectasis.  Lungs otherwise clear.  No pleural effusion or pneumothorax.  Diffuse osseous demineralization.  Chronic RIGHT rotator cuff tear.  IMPRESSION: Persistent RIGHT basilar atelectasis.   Electronically Signed   By: Ulyses Southward M.D.   On: 01/21/2015 08:33   Dg Chest [facility]  01/20/2015   CLINICAL DATA:  Sepsis  EXAM: PORTABLE CHEST - 1 VIEW  COMPARISON:  10/01/2014  FINDINGS: New right IJ central line. The tip is at the level of the central right atrium. Hypoventilation exacerbates inferior tip positioning.  Streaky lower lung opacities, greater on the right is confirmed by CT earlier today, with small right pleural effusion.  Right upper quadrant percutaneous drain noted.  No pneumothorax.  Stable mild cardiomegaly.  IMPRESSION: 1. New right IJ central line with tip at the right atrial level. No pneumothorax. 2. Low lung volumes with bibasilar atelectasis or  pneumonia.   Electronically Signed   By: Marnee Spring M.D.   On: 01/20/2015 17:08   Ct Image Guided Drainage Percut Cath  Peritoneal Retroperit  01/20/2015   CLINICAL DATA:  GALLBLADDER FOSSA AIR-FLUID COLLECTION, SEPSIS, CONCERN FOR INTRA-ABDOMINAL ABSCESS  EXAM: CT GUIDED DRAINAGE CATHETER INSERTION OF THE GALLBLADDER FOSSA ABSCESS  ANESTHESIA/SEDATION: NO SEDATION.  PATIENT IS HYPOTENSIVE.  1% lidocaine locally.  PROCEDURE: The procedure risks, benefits, and alternatives were explained to the patient. Questions regarding the procedure were encouraged and answered. The patient understands and consents to the procedure.  The right upper quadrant was prepped with ChloraPrepin a sterile fashion, and a sterile drape was applied covering the operative field. A sterile gown and sterile gloves were used for the procedure. Local anesthesia was provided with 1% Lidocaine.  Previous imaging reviewed. Patient positioned supine. Noncontrast localization CT performed. The heterogeneous hyperdense air collection in the gallbladder fossa was localized. Under sterile conditions and local anesthesia, an 18 gauge 15 cm access needle was advanced from an anterior oblique intercostal approach into the gallbladder fossa collection. Needle position confirmed with CT. Guidewire inserted followed by tract dilatation to insert a 12 [place] drain. Drain catheter position confirmed with CT. Syringe aspiration yielded dark thick bloody fluid, suspect infected hematoma. Sample sent for Gram stain and culture. Catheter secured with a Prolene suture and connected to external suction bulb. Sterile dressing applied to the site.  Complications: None immediate  FINDINGS: Imaging confirms CT-guided needle access of the gallbladder fossa heterogeneous air-fluid collection for drain insertion. Infected hematoma suspected. Gram stain and culture pending.  IMPRESSION: Successful CT-guided gallbladder fossa abscess drain insertion as above.    Electronically Signed   By: Judie Petit.  Shick M.D.   On: 01/20/2015 15:46    Anti-infectives: Anti-infectives    Start     Dose/Rate Route Frequency Ordered Stop   01/21/15 2000  vancomycin (VANCOCIN) 1,500 mg in sodium chloride 0.9 % 500 mL IVPB     1,500 mg 250 mL/hr over 120 Minutes Intravenous Every 24 hours 01/21/15 1048     01/20/15 2000  vancomycin (VANCOCIN) 1,250 mg in sodium chloride 0.9 % 250 mL IVPB  Status:  Discontinued     1,250 mg 166.7 mL/hr over 90 Minutes Intravenous Every 24 hours 01/19/15 1945 01/20/15 0843   01/20/15 2000  vancomycin (VANCOCIN) 1,250 mg in sodium chloride 0.9 % 250 mL IVPB  Status:  Discontinued     1,250 mg 166.7 mL/hr over 90 Minutes Intravenous Every 24 hours 01/20/15 1519 01/21/15 1048   01/20/15 0000  piperacillin-tazobactam (ZOSYN) IVPB 3.375 g     3.375 g 12.5 mL/hr over 240 Minutes Intravenous Every 8 hours 01/19/15 1946     01/19/15 1715  vancomycin (VANCOCIN) 2,500 mg in sodium chloride 0.9 % 500 mL IVPB     2,500 mg 250 mL/hr over 120 Minutes Intravenous  Once 01/19/15 1707 01/19/15 2159  01/19/15 1715  piperacillin-tazobactam (ZOSYN) IVPB 3.375 g     3.375 g 100 mL/hr over 30 Minutes Intravenous  Once 01/19/15 1707 01/19/15 1900      Assessment & Plans: Sepsis with shock, Intraabdominal abscess, peritonitis S/p laparoscopic cholecystectomy 12/30/14 with drain placement S/p sepsis, acute cholecystitis, shock, acute renal failure, with IR cholecystostomy Acute on chronic renal insuffiencey AODM ID Hypertension Anemia  Gout Obesity Antibiotics: Day 3 Zosyn/Vancomycin DVT:on Eliquis last dose 01/18/15 - SCD no anticoagulant currently  Will begin clear liquid diet today.  Monitor GI function.  Appreciate CCM assistance.  Velora Heckler, MD, [facility] Office: [phone]   Kazandra Forstrom Judie Petit 01/22/2015

## 2015-01-22 NOTE — Progress Notes (Signed)
General Surgery [facility]  Contacted by nurse in ICU about patient.  Continued complaints of abdominal pain, tachycardia, air emerging from perc drain when opened for flushing.  CT scan of abdomen and pelvis obtained without oral or IV contrast.  Results reviewed with Dr. Tyron Russell from radiology.  Gallbladder fossa collection slightly larger than when drain placed, may represent some bleeding into the abscess at the time of drain placement.  No sign of free air in abdomen.  Small fluid/blood around liver.  Right pleural effusion.  Will ask CCM to review CT and note effusion.  May want to consider transfusion with Hgb 7.5, possible bleed into abscess, and tachycardia.  Will follow closely.  Velora Heckler, MD, [facility] Office: [phone]

## 2015-01-22 NOTE — Progress Notes (Signed)
Patient is complaining of worst pain in his life.  Abdomen is more distended and very tender.  JP drain produced 10 ml last night, and contains 5 ml at this time. Paged CCS.

## 2015-01-23 LAB — TYPE AND SCREEN
ABO/RH(D): O NEG
ANTIBODY SCREEN: NEGATIVE
UNIT DIVISION: 0
UNIT DIVISION: 0
Unit division: 0
Unit division: 0

## 2015-01-23 LAB — COMPREHENSIVE METABOLIC PANEL
ALT: 37 U/L (ref 17–63)
AST: 30 U/L (ref 15–41)
Albumin: 2.2 g/dL — ABNORMAL LOW (ref 3.5–5.0)
Alkaline Phosphatase: 81 U/L (ref 38–126)
Anion gap: 7 (ref 5–15)
BUN: 38 mg/dL — ABNORMAL HIGH (ref 6–20)
CALCIUM: 7.7 mg/dL — AB (ref 8.9–10.3)
CO2: 24 mmol/L (ref 22–32)
Chloride: 105 mmol/L (ref 101–111)
Creatinine, Ser: 2.75 mg/dL — ABNORMAL HIGH (ref 0.61–1.24)
GFR, EST AFRICAN AMERICAN: 26 mL/min — AB (ref >60)
GFR, EST NON AFRICAN AMERICAN: 22 mL/min — AB (ref >60)
GLUCOSE: 230 mg/dL — AB (ref 65–99)
POTASSIUM: 4.1 mmol/L (ref 3.5–5.1)
Sodium: 136 mmol/L (ref 135–145)
Total Bilirubin: 1.5 mg/dL — ABNORMAL HIGH (ref 0.3–1.2)
Total Protein: 5.6 g/dL — ABNORMAL LOW (ref 6.5–8.1)

## 2015-01-23 LAB — HEMOGLOBIN AND HEMATOCRIT, BLOOD
HCT: 17.8 % — ABNORMAL LOW (ref 39.0–52.0)
HCT: 22.4 % — ABNORMAL LOW (ref 39.0–52.0)
HEMOGLOBIN: 7.6 g/dL — AB (ref 13.0–17.0)
Hemoglobin: 5.8 g/dL — CL (ref 13.0–17.0)

## 2015-01-23 LAB — MAGNESIUM: Magnesium: 1.8 mg/dL (ref 1.7–2.4)

## 2015-01-23 LAB — PROTIME-INR
INR: 1.32 (ref 0.00–1.49)
Prothrombin Time: 16.5 seconds — ABNORMAL HIGH (ref 11.6–15.2)

## 2015-01-23 LAB — CBC
HEMATOCRIT: 17.8 % — AB (ref 39.0–52.0)
HEMOGLOBIN: 5.9 g/dL — AB (ref 13.0–17.0)
MCH: 29.7 pg (ref 26.0–34.0)
MCHC: 32.6 g/dL (ref 30.0–36.0)
MCV: 91.3 fL (ref 78.0–100.0)
Platelets: 275 10*3/uL (ref 150–400)
RBC: 1.95 MIL/uL — ABNORMAL LOW (ref 4.22–5.81)
RDW: 15.4 % (ref 11.5–15.5)
WBC: 17.2 10*3/uL — ABNORMAL HIGH (ref 4.0–10.5)

## 2015-01-23 LAB — FIBRINOGEN: Fibrinogen: >800 mg/dL — ABNORMAL HIGH (ref 204–475)

## 2015-01-23 LAB — APTT: aPTT: 38 seconds — ABNORMAL HIGH (ref 24–37)

## 2015-01-23 LAB — GLUCOSE, CAPILLARY
GLUCOSE-CAPILLARY: 258 mg/dL — AB (ref 65–99)
GLUCOSE-CAPILLARY: 233 mg/dL — AB (ref 65–99)
GLUCOSE-CAPILLARY: 202 mg/dL — AB (ref 65–99)
GLUCOSE-CAPILLARY: 189 mg/dL — AB (ref 65–99)
Glucose-Capillary: 221 mg/dL — ABNORMAL HIGH (ref 65–99)
Glucose-Capillary: 202 mg/dL — ABNORMAL HIGH (ref 65–99)
Glucose-Capillary: 184 mg/dL — ABNORMAL HIGH (ref 65–99)

## 2015-01-23 LAB — CULTURE, ROUTINE-ABSCESS: SPECIAL REQUESTS: NORMAL

## 2015-01-23 LAB — PHOSPHORUS: Phosphorus: 3.9 mg/dL (ref 2.5–4.6)

## 2015-01-23 MED ORDER — FUROSEMIDE 10 MG/ML IJ SOLN
20.0000 mg | Freq: Once | INTRAMUSCULAR | Status: AC
  Administered 2015-01-23: 20 mg via INTRAVENOUS
  Filled 2015-01-23: qty 2

## 2015-01-23 NOTE — Progress Notes (Signed)
Patient ID: Larry [name], male   DOB: December 09, 1945, 69 y.o.   MRN: [id number]  General Surgery - [facility]  HD#: 4  Subjective: Patient awake, responsive.  Strong cough this AM.  Less abdominal pain, mostly RUQ.  BM overnight.  Objective: Vital signs in last 24 hours: Temp:  [98.2 F (36.8 C)-99 F (37.2 C)] 98.6 F (37 C) (08/07 0400) Pulse Rate:  [105-119] 113 (08/07 0700) Resp:  [25-40] 25 (08/07 0700) BP: (87-129)/(43-69) 103/53 mmHg (08/07 0700) SpO2:  [87 %-100 %] 100 % (08/07 0825) Last BM Date: 01/21/15  Intake/Output from previous day: 08/06 0701 - 08/07 0700 In: 2685 [P.O.:600; I.V.:1400; IV Piggyback:650] Out: 525 [Urine:525] Intake/Output this shift:    Physical Exam: HEENT - sclerae clear, mucous membranes moist Neck - soft Chest - increased volume bilaterally, few wheezes Cor - mild tachycardia Abdomen - slightly softer; moderate tenderness RUQ; BS present; serous output from drain Neuro - alert & oriented, no focal deficits  Lab Results:   Recent Labs  01/22/15 0453 01/23/15 0610 01/23/15 0754  WBC 15.0* 17.2*  --   HGB 7.5* 5.9* 5.8*  HCT 23.5* 17.8* 17.8*  PLT 294 275  --    BMET  Recent Labs  01/21/15 1445 01/22/15 0453  NA 140 140  K 4.1 4.2  CL 109 109  CO2 24 23  GLUCOSE 182* 184*  BUN 36* 31*  CREATININE 1.90* 1.83*  CALCIUM 7.4* 7.6*   PT/INR No results for input(s): LABPROT, INR in the last 72 hours. Comprehensive Metabolic Panel:    Component Value Date/Time   NA 140 01/22/2015 0453   NA 140 01/21/2015 1445   K 4.2 01/22/2015 0453   K 4.1 01/21/2015 1445   CL 109 01/22/2015 0453   CL 109 01/21/2015 1445   CO2 23 01/22/2015 0453   CO2 24 01/21/2015 1445   BUN 31* 01/22/2015 0453   BUN 36* 01/21/2015 1445   CREATININE 1.83* 01/22/2015 0453   CREATININE 1.90* 01/21/2015 1445   GLUCOSE 184* 01/22/2015 0453   GLUCOSE 182* 01/21/2015 1445   CALCIUM 7.6* 01/22/2015 0453   CALCIUM 7.4* 01/21/2015  1445   AST 43* 01/22/2015 0453   AST 44* 01/21/2015 0750   ALT 46 01/22/2015 0453   ALT 42 01/21/2015 0750   ALKPHOS 88 01/22/2015 0453   ALKPHOS 62 01/21/2015 0750   BILITOT 1.5* 01/22/2015 0453   BILITOT 1.6* 01/21/2015 0750   PROT 6.1* 01/22/2015 0453   PROT 5.7* 01/21/2015 0750   ALBUMIN 2.8* 01/22/2015 0453   ALBUMIN 2.7* 01/21/2015 0750    Studies/Results: Ct Abdomen Pelvis Wo Contrast  01/22/2015   CLINICAL DATA:  RIGHT lower quadrant and LEFT lower quadrant pain for 2 days, abscess check, post CT-guided abscess drainage  EXAM: CT ABDOMEN AND PELVIS WITHOUT CONTRAST  TECHNIQUE: Multidetector CT imaging of the abdomen and pelvis was performed following the standard protocol without IV contrast. Oral contrast was not administered. Sagittal and coronal MPR images reconstructed from axial data set.  COMPARISON:  01/19/2015  FINDINGS: Bibasilar pleural effusions and atelectasis greater on RIGHT.  Scattered atherosclerotic calcifications including coronary arteries.  Percutaneous drainage catheter identified within the large abscess containing gas and fluid located at the gallbladder fossa.  Collection has increased in size since the previous exam, currently 10.0 x 10.2 x 9.6 cm, previously  5.8 x 9.3 x 6.1 cm, portions now higher in attenuation question interval hemorrhage.  Additionally, a new higher attenuation collection is seen surrounding  the inferior aspect of the RIGHT lobe of the liver, containing higher attenuation posteriorly in inferiorly and lower in attenuation anteriorly and superiorlylikely hemoperitoneum with hematocrit layering, measuring 11.4 x 9.9 x ~7 cm in maximal dimensions.  Increased ascites.  Single tiny focus of gas identified between the liver and stomach image 29, nonspecific in the setting of percutaneous drain placement 2 days prior.  Spleen, pancreas, kidneys, and adrenal glands stable.  Bowel wall thickening of the splenic flexure and distal ascending colon again  identified.  Stomach and remaining bowel loops unremarkable.  Scattered atherosclerotic calcifications.  No mass, adenopathy, additional free air, or hernia.  Scattered atherosclerotic calcifications.  Significant beam hardening artifacts from RIGHT hip prosthesis.  No acute osseous findings.  Significant scattered subcutaneous edema at both flanks.  IMPRESSION: Post percutaneous drainage 's of a gallbladder fossa abscess collection, collection now larger and higher in attenuation than on the previous study question interval hemorrhage.  Additionally, increased flow void with high density layering noted perihepatic compatible with increased intraperitoneal hemorrhage.  Increased ascites.  Mild increase in RIGHT pleural effusion and basilar atelectasis.  Findings called to Dr. Gerrit Friends on 01/22/2015 at 1704 hr.   Electronically Signed   By: Ulyses Southward M.D.   On: 01/22/2015 17:06    Anti-infectives: Anti-infectives    Start     Dose/Rate Route Frequency Ordered Stop   01/21/15 2000  vancomycin (VANCOCIN) 1,500 mg in sodium chloride 0.9 % 500 mL IVPB     1,500 mg 250 mL/hr over 120 Minutes Intravenous Every 24 hours 01/21/15 1048     01/20/15 2000  vancomycin (VANCOCIN) 1,250 mg in sodium chloride 0.9 % 250 mL IVPB  Status:  Discontinued     1,250 mg 166.7 mL/hr over 90 Minutes Intravenous Every 24 hours 01/19/15 1945 01/20/15 0843   01/20/15 2000  vancomycin (VANCOCIN) 1,250 mg in sodium chloride 0.9 % 250 mL IVPB  Status:  Discontinued     1,250 mg 166.7 mL/hr over 90 Minutes Intravenous Every 24 hours 01/20/15 1519 01/21/15 1048   01/20/15 0000  piperacillin-tazobactam (ZOSYN) IVPB 3.375 g     3.375 g 12.5 mL/hr over 240 Minutes Intravenous Every 8 hours 01/19/15 1946     01/19/15 1715  vancomycin (VANCOCIN) 2,500 mg in sodium chloride 0.9 % 500 mL IVPB     2,500 mg 250 mL/hr over 120 Minutes Intravenous  Once 01/19/15 1707 01/19/15 2159   01/19/15 1715  piperacillin-tazobactam (ZOSYN) IVPB 3.375  g     3.375 g 100 mL/hr over 30 Minutes Intravenous  Once 01/19/15 1707 01/19/15 1900      Assessment & Plans: Sepsis with shock, Intraabdominal abscess, peritonitis S/p laparoscopic cholecystectomy 12/30/14 with drain placement Hx of sepsis, acute cholecystitis, shock, acute renal failure, with IR cholecystostomy Acute on chronic renal insuffiencey AODM ID Hypertension Anemia  Gout Obesity IV Zosyn/Vancomycin  Reviewed CT scan yesterday - probable bleeding with perc drain placement into perihepatic space.  Hgb down this AM to 5.8.  Would agree with transfusion - per medicine and CCM.  Pulmonary status seems slightly improved this AM.  Will check coagulation studies and correct to normal if out of range.  Will follow closely.   Velora Heckler, MD, [facility] Office: [phone]   Besse Miron Judie Petit 01/23/2015
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
## 2015-01-23 NOTE — Progress Notes (Signed)
PULMONARY / CRITICAL CARE MEDICINE   Name: Larry [name] MRN: [id number] DOB: September 06, 1945    ADMISSION DATE:  01/19/2015 CONSULTATION DATE:  8/4  REFERRING MD :  [name]  CHIEF COMPLAINT:  Septic shock   INITIAL PRESENTATION:  69 year old male w/ CAF on elliquis recently underwent lap chole on 7/14. Was discharged to home w/drain. Drain removed about a week before presentation to ER. Admitted directly from [facility] on 8/3 w/ septic shock which was likely due to infected hematoma in the GB fossa. PCCM asked to see after new perc drain placed on 8/4, when he remained hypotensive.   STUDIES:  CT abdomen/Pelvis W/ 7/28 - Gas & fluid at cholecystectomy site w/o discrete abscess. Min ascites. Small right pleural eff. CT abdomen/pelvis w/o 8/5 - Gas between liver & stomach. No additional free air. Increased flow void & high density layering c/w increasing hemorrhage as well as increased right pleural eff.  SIGNIFICANT EVENTS: Admitted 8/3 w/ abd pain and hypotension  8/4 hypotensive over-night. Went for [facility] drain. Found what looked like old infected hematoma. PCCM asked to see post-op for shock 8/5 off pressors. WOB worse.  8/6 WOB & wheezing slightly worse. Progressing abdominal pain 8/7 Hgb declining w/ increasing intraperit hemorrhage  SUBJECTIVE: WOB unchanged. Still has pain diffusely in abdomen. Denies any chest pain or pressure.   ROS:  No nausea or emesis. No fever or sweats.  VITAL SIGNS: Temp:  [98.2 F (36.8 C)-99.9 F (37.7 C)] 98.6 F (37 C) (08/07 0400) Pulse Rate:  [105-119] 113 (08/07 0700) Resp:  [25-40] 25 (08/07 0700) BP: (87-129)/(43-69) 103/53 mmHg (08/07 0700) SpO2:  [87 %-100 %] 93 % (08/07 0700) 2 liters HEMODYNAMICS: CVP:  [14 mmHg-16 mmHg] 16 mmHg VENTILATOR SETTINGS:   INTAKE / OUTPUT:  Intake/Output Summary (Last 24 hours) at 01/23/15 0755 Last data filed at 01/23/15 0700  Gross per 24 hour  Intake   2680 ml  Output    525 ml  Net    2155 ml    PHYSICAL EXAMINATION: General:  Appears uncomfortable. Laying recumbent. Mild distress. Awake & alert. Neuro:  Oriented x3. Moving all 4 extremities equally.  HEENT:  No scleral icterus. MMM. No oral ulcers. Cardiovascular:  Tachycardic. Sinus tach. No edema. Lungs:  Decreased breath sounds bilateral bases. Mild end-expiratory wheezing unchanged. Moderately increased WOB on [place] oxygen. Abdomen:  RUQ perc drain in place. Diffusely tender to palp w/ peritonitis. Hypoactive BS. Protuberant. Musculoskeletal:  No joint effusions or deformities. Skin:  Warm & dry. No rash. No abdominal bruising.  LABS:  CBC  Recent Labs Lab 01/21/15 0650 01/22/15 0453 01/23/15 0610  WBC 16.8* 15.0* 17.2*  HGB 7.6* 7.5* 5.9*  HCT 23.0* 23.5* 17.8*  PLT 301 294 275   Coag's  Recent Labs Lab 01/19/15 1613  APTT 35  INR 1.46   BMET  Recent Labs Lab 01/21/15 0750 01/21/15 1445 01/22/15 0453  NA 138 140 140  K 4.6 4.1 4.2  CL 112* 109 109  CO2 BUN 37* 36* 31*  CREATININE 2.10* 1.90* 1.83*  GLUCOSE 201* 182* 184*   Electrolytes  Recent Labs Lab 01/19/15 1613  01/21/15 0750 01/21/15 1445 01/22/15 0453  CALCIUM 7.9*  < > 7.0* 7.4* 7.6*  MG 1.8  --   --   --   --   < > = values in this interval not displayed. Sepsis Markers  Recent Labs Lab 01/20/15 1420 01/20/15 1550 01/20/15 1807  LATICACIDVEN 1.6 1.8 0.8   ABG  Recent Labs Lab 01/21/15 0809  PHART 7.378  PCO2ART 38.8  PO2ART 68.6*   Liver Enzymes  Recent Labs Lab 01/21/15 0650 01/21/15 0750 01/22/15 0453  AST 45* 44* 43*  ALT 43 42 46  ALKPHOS 66 62 88  BILITOT 1.8* 1.6* 1.5*  ALBUMIN 2.7* 2.7* 2.8*   Cardiac Enzymes No results for input(s): TROPONINI, PROBNP in the last 168 hours. Glucose  Recent Labs Lab 01/22/15 0101 01/22/15 0421 01/22/15 0840 01/22/15 1143 01/22/15 1603 01/22/15 2021  GLUCAP 158* 161* 155* 222* 230* 184*    Imaging Ct Abdomen Pelvis Wo  Contrast  01/22/2015   CLINICAL DATA:  RIGHT lower quadrant and LEFT lower quadrant pain for 2 days, abscess check, post CT-guided abscess drainage  EXAM: CT ABDOMEN AND PELVIS WITHOUT CONTRAST  TECHNIQUE: Multidetector CT imaging of the abdomen and pelvis was performed following the standard protocol without IV contrast. Oral contrast was not administered. Sagittal and coronal MPR images reconstructed from axial data set.  COMPARISON:  01/19/2015  FINDINGS: Bibasilar pleural effusions and atelectasis greater on RIGHT.  Scattered atherosclerotic calcifications including coronary arteries.  Percutaneous drainage catheter identified within the large abscess containing gas and fluid located at the gallbladder fossa.  Collection has increased in size since the previous exam, currently 10.0 x 10.2 x 9.6 cm, previously  5.8 x 9.3 x 6.1 cm, portions now higher in attenuation question interval hemorrhage.  Additionally, a new higher attenuation collection is seen surrounding the inferior aspect of the RIGHT lobe of the liver, containing higher attenuation posteriorly in inferiorly and lower in attenuation anteriorly and superiorlylikely hemoperitoneum with hematocrit layering, measuring 11.4 x 9.9 x ~7 cm in maximal dimensions.  Increased ascites.  Single tiny focus of gas identified between the liver and stomach image 29, nonspecific in the setting of percutaneous drain placement 2 days prior.  Spleen, pancreas, kidneys, and adrenal glands stable.  Bowel wall thickening of the splenic flexure and distal ascending colon again identified.  Stomach and remaining bowel loops unremarkable.  Scattered atherosclerotic calcifications.  No mass, adenopathy, additional free air, or hernia.  Scattered atherosclerotic calcifications.  Significant beam hardening artifacts from RIGHT hip prosthesis.  No acute osseous findings.  Significant scattered subcutaneous edema at both flanks.  IMPRESSION: Post percutaneous drainage 's of a  gallbladder fossa abscess collection, collection now larger and higher in attenuation than on the previous study question interval hemorrhage.  Additionally, increased flow void with high density layering noted perihepatic compatible with increased intraperitoneal hemorrhage.  Increased ascites.  Mild increase in RIGHT pleural effusion and basilar atelectasis.  Findings called to Dr. Gerrit Friends on 01/22/2015 at 1704 hr.   Electronically Signed   By: Ulyses Southward M.D.   On: 01/22/2015 17:06   Elevated Right HD. Right > left patchy infiltrates ASSESSMENT / PLAN:  PULMONARY A: Chronically elevated right HD Evolving patchy pulm infiltrates on right  Acute Hypoxic Respiratory Failure Right pleural effusion - progessing on Abd CT 8/6  P:   Probable intubation this AM Supplemental oxygen for sats > 92% Xopenex neb q6hr Reflux precautions   CARDIOVASCULAR CVL Right IJ 8/4>> A:  Severe sepsis/septic shock--> H/o PAF; CHADVASC score 4; currently NSR  Prior NSTEMI   P:  Hold all antihypertensives CVP goal 8-12 Add levophed for MAP > 65  RENAL A:   ARF - Improving previously Hyperkalemia - Resolved  P:   Monitor UOP Stat electrolytes Daily BUN/Creatinine MAP goal > 65 Continue  MIVF to D5 1/2 NS at 22ml/hr Holding Lasix diuresis at this time  GASTROINTESTINAL A:   Infected hematoma s/p cholecystectomy. Now s/p CT guided Perc drain  GERD-->worse 8/5 w/ associated UAW wheeze  P:   Diet and drain per surgery and IR services  Protonix IV Reflux precautions  HEMATOLOGIC A:   Anemia - Acute drop in Hgb this AM  P:  Checking Coags & repeat Hgb/Hct stat S/p 2 units PRBC on 8/4 Trend Hgb q12hr SCDs Transfuse for Hbg < 7   INFECTIOUS A:   Severe sepsis/septic shock - presume source is infected hematoma from recent Cholecystectomy  S/p Perc GB fossa site drain 8/4 - minimal output  P:   Trending leukocytosis Monitoring for fever  Zosyn 8/3>>> vanc 8/3>>>  Abscess  8/4>>> BCX2 8/4>>>  ENDOCRINE A: DM  P:   SSI Accuchecks q4hr Hold oral agents  NEUROLOGIC A:  Abdominal Pain/Peritonitis - increasing At risk for delirium   P:   RASS goal: n/a Supportive care Fentanyl IV prn   FAMILY  - Updates: Wife not at bedside at the time of my exam. Notified regarding his worsening status.  - Inter-disciplinary family meet or Palliative Care meeting due by:  8/11  TODAY'S SUMMARY: Respiratory status remains tenuous with high potential for intubation and further decompensation rapidly with infusion of more blood. Concerning is his decline in UOP. Awaiting repeat Hgb & electrolytes before proceeding. Spoke with Dr. Gerrit Friends today regarding concerns.  I have spent a total of 33 minutes of critical care time today caring for this patient, reviewing his EMR, and discussing the plan of care with his nurse, RT, and Dr. Gerrit Friends.  Donna Christen Jamison Neighbor, M.D. [facility]monary [facility] Critical [facility] Pager:  [phone] After 3pm or if no response, call [phone]  01/23/2015 7:55 AM

## 2015-01-23 NOTE — Progress Notes (Signed)
Referring Physician(s): CCS  Chief Complaint: Gallbladder fossa hematoma  Subjective: GB fossa abscess s/p perc drain by IR 8/4 Events noted, Hgb drop to 5.8 CT scan reviewed, probable interval hemorrhage Pt c/o RUQ pain  Allergies: Review of patient's allergies indicates no known allergies.  Medications:  Current facility-administered medications:  .  0.9 %  sodium chloride infusion, , Intravenous, Once, Rhetta Mura, MD .  antiseptic oral rinse (CPC / CETYLPYRIDINIUM CHLORIDE 0.05%) solution 7 mL, 7 mL, Mouth Rinse, q12n4p, Simonne Martinet, NP, 7 mL at 01/22/15 1600 .  chlorhexidine gluconate (PERIDEX) 0.12 % solution 15 mL, 15 mL, Mouth Rinse, BID, Simonne Martinet, NP, 15 mL at 01/22/15 2100 .  dextrose 5 % and 0.2 % NaCl infusion, , Intravenous, Continuous, Simonne Martinet, NP, Last Rate: 50 mL/hr at 01/22/15 2059 .  diphenhydrAMINE (BENADRYL) injection 12.5 mg, 12.5 mg, Intravenous, Once, Rhetta Mura, MD .  fentaNYL (SUBLIMAZE) injection 25-50 mcg, 25-50 mcg, Intravenous, Q2H PRN, Coralyn Helling, MD, 50 mcg at 01/23/15 0934 .  furosemide (LASIX) injection 20 mg, 20 mg, Intravenous, Once, Rhetta Mura, MD .  furosemide (LASIX) injection 20 mg, 20 mg, Intravenous, Once, Rhetta Mura, MD .  insulin aspart (novoLOG) injection 0-15 Units, 0-15 Units, Subcutaneous, 6 times per day, Leanne Chang, NP, 5 Units at 01/23/15 [phone] .  insulin glargine (LANTUS) injection 15 Units, 15 Units, Subcutaneous, QHS, Rhetta Mura, MD, 15 Units at 01/23/15 0026 .  levalbuterol (XOPENEX) nebulizer solution 0.63 mg, 0.63 mg, Nebulization, Q6H, Roslynn Amble, MD, 0.63 mg at 01/23/15 0824 .  levalbuterol (XOPENEX) nebulizer solution 0.63 mg, 0.63 mg, Nebulization, Q6H PRN, Rolan Lipa, NP .  LORazepam (ATIVAN) injection 0.5-1 mg, 0.5-1 mg, Intravenous, Q4H PRN, Coralyn Helling, MD, 1 mg at 01/23/15 0138 .  ondansetron (ZOFRAN-ODT) disintegrating tablet  4 mg, 4 mg, Oral, Q6H PRN, 4 mg at 01/19/15 2114 **OR** ondansetron (ZOFRAN) injection 4 mg, 4 mg, Intravenous, Q6H PRN, Sherrie George, PA-C, 4 mg at 01/22/15 0434 .  ondansetron (ZOFRAN) tablet 8 mg, 8 mg, Oral, BID, Rhetta Mura, MD, 8 mg at 01/22/15 2100 .  pantoprazole (PROTONIX) injection 40 mg, 40 mg, Intravenous, Q12H, Rhetta Mura, MD, 40 mg at 01/23/15 0934 .  piperacillin-tazobactam (ZOSYN) IVPB 3.375 g, 3.375 g, Intravenous, Q8H, Maryanna Shape Runyon, RPH, 3.375 g at 01/23/15 0934 .  vancomycin (VANCOCIN) 1,500 mg in sodium chloride 0.9 % 500 mL IVPB, 1,500 mg, Intravenous, Q24H, Md Ccs, MD, 1,500 mg at 01/22/15 2059    Vital Signs: BP 111/53 mmHg  Pulse 105  Temp(Src) 99.1 F (37.3 C) (Axillary)  Resp 29  Ht  (1.676 m)  Wt 268 lb 8.3 oz (121.8 kg)  BMI 43.36 kg/m2  SpO2 100%  Physical Exam  Abdominal:  RUQ drain intact, site clean. Output serous. Tender in RUQ    Imaging: Ct Abdomen Pelvis Wo Contrast  01/22/2015   CLINICAL DATA:  RIGHT lower quadrant and LEFT lower quadrant pain for 2 days, abscess check, post CT-guided abscess drainage  EXAM: CT ABDOMEN AND PELVIS WITHOUT CONTRAST  TECHNIQUE: Multidetector CT imaging of the abdomen and pelvis was performed following the standard protocol without IV contrast. Oral contrast was not administered. Sagittal and coronal MPR images reconstructed from axial data set.  COMPARISON:  01/19/2015  FINDINGS: Bibasilar pleural effusions and atelectasis greater on RIGHT.  Scattered atherosclerotic calcifications including coronary arteries.  Percutaneous drainage catheter identified within the large abscess containing gas and fluid located at the  gallbladder fossa.  Collection has increased in size since the previous exam, currently 10.0 x 10.2 x 9.6 cm, previously  5.8 x 9.3 x 6.1 cm, portions now higher in attenuation question interval hemorrhage.  Additionally, a new higher attenuation collection is seen surrounding the  inferior aspect of the RIGHT lobe of the liver, containing higher attenuation posteriorly in inferiorly and lower in attenuation anteriorly and superiorlylikely hemoperitoneum with hematocrit layering, measuring 11.4 x 9.9 x ~7 cm in maximal dimensions.  Increased ascites.  Single tiny focus of gas identified between the liver and stomach image 29, nonspecific in the setting of percutaneous drain placement 2 days prior.  Spleen, pancreas, kidneys, and adrenal glands stable.  Bowel wall thickening of the splenic flexure and distal ascending colon again identified.  Stomach and remaining bowel loops unremarkable.  Scattered atherosclerotic calcifications.  No mass, adenopathy, additional free air, or hernia.  Scattered atherosclerotic calcifications.  Significant beam hardening artifacts from RIGHT hip prosthesis.  No acute osseous findings.  Significant scattered subcutaneous edema at both flanks.  IMPRESSION: Post percutaneous drainage 's of a gallbladder fossa abscess collection, collection now larger and higher in attenuation than on the previous study question interval hemorrhage.  Additionally, increased flow void with high density layering noted perihepatic compatible with increased intraperitoneal hemorrhage.  Increased ascites.  Mild increase in RIGHT pleural effusion and basilar atelectasis.  Findings called to Dr. Gerrit Friends on 01/22/2015 at 1704 hr.   Electronically Signed   By: Ulyses Southward M.D.   On: 01/22/2015 17:06   Dg Chest Port 1 View  01/21/2015   CLINICAL DATA:  Shortness of breath, atrial fibrillation, hypertension, diabetes mellitus, history coronary artery disease post MI  EXAM: PORTABLE CHEST - 1 VIEW  COMPARISON:  Portable exam 0810 hours compared to 01/20/2015  FINDINGS: RIGHT jugular central venous catheter with tip projecting over cavoatrial junction or high RIGHT atrium.  Borderline enlargement of cardiac silhouette.  Mediastinal contours and pulmonary vascularity normal.  Persistent  elevation of RIGHT diaphragm with RIGHT basilar atelectasis.  Lungs otherwise clear.  No pleural effusion or pneumothorax.  Diffuse osseous demineralization.  Chronic RIGHT rotator cuff tear.  IMPRESSION: Persistent RIGHT basilar atelectasis.   Electronically Signed   By: Ulyses Southward M.D.   On: 01/21/2015 08:33   Dg Chest Port 1 View  01/20/2015   CLINICAL DATA:  Sepsis  EXAM: PORTABLE CHEST - 1 VIEW  COMPARISON:  10/01/2014  FINDINGS: New right IJ central line. The tip is at the level of the central right atrium. Hypoventilation exacerbates inferior tip positioning.  Streaky lower lung opacities, greater on the right is confirmed by CT earlier today, with small right pleural effusion.  Right upper quadrant percutaneous drain noted.  No pneumothorax.  Stable mild cardiomegaly.  IMPRESSION: 1. New right IJ central line with tip at the right atrial level. No pneumothorax. 2. Low lung volumes with bibasilar atelectasis or pneumonia.   Electronically Signed   By: Marnee Spring M.D.   On: 01/20/2015 17:08   Dg Chest Port 1 View  01/19/2015   CLINICAL DATA:  Increasing abdominal pain since laparoscopic cholecystectomy 01/04/2015. Right upper and lower quadrant pain. Nausea, shortness of breath and weakness.  EXAM: PORTABLE CHEST - 1 VIEW  COMPARISON:  10/06/2014  FINDINGS: Patient is rotated to the right. Lungs are hypoinflated with minimal right perihilar and medial right basilar opacification which may be due to atelectasis or infection. No evidence of effusion or pneumothorax. Cardiomediastinal silhouette and remainder of the exam  is unchanged.  IMPRESSION: Mild opacification in the right perihilar region and medial right base which may be due to atelectasis or infection.   Electronically Signed   By: Elberta Fortis M.D.   On: 01/19/2015 16:19   Ct Image Guided Drainage Percut Cath  Peritoneal Retroperit  01/20/2015   CLINICAL DATA:  GALLBLADDER FOSSA AIR-FLUID COLLECTION, SEPSIS, CONCERN FOR INTRA-ABDOMINAL  ABSCESS  EXAM: CT GUIDED DRAINAGE CATHETER INSERTION OF THE GALLBLADDER FOSSA ABSCESS  ANESTHESIA/SEDATION: NO SEDATION.  PATIENT IS HYPOTENSIVE.  1% lidocaine locally.  PROCEDURE: The procedure risks, benefits, and alternatives were explained to the patient. Questions regarding the procedure were encouraged and answered. The patient understands and consents to the procedure.  The right upper quadrant was prepped with ChloraPrepin a sterile fashion, and a sterile drape was applied covering the operative field. A sterile gown and sterile gloves were used for the procedure. Local anesthesia was provided with 1% Lidocaine.  Previous imaging reviewed. Patient positioned supine. Noncontrast localization CT performed. The heterogeneous hyperdense air collection in the gallbladder fossa was localized. Under sterile conditions and local anesthesia, an 18 gauge 15 cm access needle was advanced from an anterior oblique intercostal approach into the gallbladder fossa collection. Needle position confirmed with CT. Guidewire inserted followed by tract dilatation to insert a 12 [place] drain. Drain catheter position confirmed with CT. Syringe aspiration yielded dark thick bloody fluid, suspect infected hematoma. Sample sent for Gram stain and culture. Catheter secured with a Prolene suture and connected to external suction bulb. Sterile dressing applied to the site.  Complications: None immediate  FINDINGS: Imaging confirms CT-guided needle access of the gallbladder fossa heterogeneous air-fluid collection for drain insertion. Infected hematoma suspected. Gram stain and culture pending.  IMPRESSION: Successful CT-guided gallbladder fossa abscess drain insertion as above.   Electronically Signed   By: Judie Petit.  Shick M.D.   On: 01/20/2015 15:46    Labs:  CBC:  Recent Labs  01/20/15 2108 01/21/15 0650 01/22/15 0453 01/23/15 0610 01/23/15 0754  WBC 18.5* 16.8* 15.0* 17.2*  --   HGB 7.8* 7.6* 7.5* 5.9* 5.8*  HCT 24.2* 23.0*  23.5* 17.8* 17.8*  PLT 319 301 294 275  --     COAGS:  Recent Labs  10/06/14 0445 01/19/15 1613 01/23/15 0754  INR 1.07 1.46 1.32  APTT 34 35 38*    BMP:  Recent Labs  01/21/15 0750 01/21/15 1445 01/22/15 0453 01/23/15 0731  NA 138 140 140 136  K 4.6 4.1 4.2 4.1  CL 112* 109 109 105  CO2 22 24 23 24   GLUCOSE 201* 182* 184* 230*  BUN 37* 36* 31* 38*  CALCIUM 7.0* 7.4* 7.6* 7.7*  CREATININE 2.10* 1.90* 1.83* 2.75*  GFRNONAA 31* 35* 36* 22*  GFRAA 36* 40* 42* 26*    LIVER FUNCTION TESTS:  Recent Labs  01/21/15 0650 01/21/15 0750 01/22/15 0453 01/23/15 0731  BILITOT 1.8* 1.6* 1.5* 1.5*  AST 45* 44* 43* 30  ALT 43 42 46 37  ALKPHOS 66 62 88 81  PROT 5.9* 5.7* 6.1* 5.6*  ALBUMIN 2.7* 2.7* 2.8* 2.2*    Assessment and Plan: GB fossa abscess/hematoma s/p perc drain by IR 8/4 Likely interval bleed into perihepatic space. Output serous despite findings IR following.  SignedBrayton El 01/23/2015, 10:40 AM

## 2015-01-24 DIAGNOSIS — IMO0002 Reserved for concepts with insufficient information to code with codable children: Secondary | ICD-10-CM | POA: Diagnosis present

## 2015-01-24 DIAGNOSIS — S3692XA Contusion of unspecified intra-abdominal organ, initial encounter: Secondary | ICD-10-CM

## 2015-01-24 LAB — COMPREHENSIVE METABOLIC PANEL
ALT: 39 U/L (ref 17–63)
AST: 38 U/L (ref 15–41)
Albumin: 2.3 g/dL — ABNORMAL LOW (ref 3.5–5.0)
Alkaline Phosphatase: 93 U/L (ref 38–126)
Anion gap: 9 (ref 5–15)
BILIRUBIN TOTAL: 1.8 mg/dL — AB (ref 0.3–1.2)
BUN: 49 mg/dL — AB (ref 6–20)
CO2: 22 mmol/L (ref 22–32)
CREATININE: 3.17 mg/dL — AB (ref 0.61–1.24)
Calcium: 8.1 mg/dL — ABNORMAL LOW (ref 8.9–10.3)
Chloride: 106 mmol/L (ref 101–111)
GFR calc Af Amer: 22 mL/min — ABNORMAL LOW (ref >60)
GFR calc non Af Amer: 19 mL/min — ABNORMAL LOW (ref >60)
Glucose, Bld: 191 mg/dL — ABNORMAL HIGH (ref 65–99)
Potassium: 4 mmol/L (ref 3.5–5.1)
Sodium: 137 mmol/L (ref 135–145)
TOTAL PROTEIN: 5.8 g/dL — AB (ref 6.5–8.1)

## 2015-01-24 LAB — CBC WITH DIFFERENTIAL/PLATELET
BASOS PCT: 0 % (ref 0–1)
Basophils Absolute: 0 10*3/uL (ref 0.0–0.1)
Eosinophils Absolute: 0.2 10*3/uL (ref 0.0–0.7)
Eosinophils Relative: 2 % (ref 0–5)
HCT: 22.6 % — ABNORMAL LOW (ref 39.0–52.0)
Hemoglobin: 7.4 g/dL — ABNORMAL LOW (ref 13.0–17.0)
Lymphocytes Relative: 5 % — ABNORMAL LOW (ref 12–46)
Lymphs Abs: 0.7 10*3/uL (ref 0.7–4.0)
MCH: 28.7 pg (ref 26.0–34.0)
MCHC: 32.7 g/dL (ref 30.0–36.0)
MCV: 87.6 fL (ref 78.0–100.0)
Monocytes Absolute: 1.2 10*3/uL — ABNORMAL HIGH (ref 0.1–1.0)
Monocytes Relative: 9 % (ref 3–12)
Neutro Abs: 11.1 10*3/uL — ABNORMAL HIGH (ref 1.7–7.7)
Neutrophils Relative %: 84 % — ABNORMAL HIGH (ref 43–77)
Platelets: 269 10*3/uL (ref 150–400)
RBC: 2.58 MIL/uL — ABNORMAL LOW (ref 4.22–5.81)
RDW: 17.8 % — ABNORMAL HIGH (ref 11.5–15.5)
WBC: 13.3 10*3/uL — ABNORMAL HIGH (ref 4.0–10.5)

## 2015-01-24 LAB — HEMOGLOBIN AND HEMATOCRIT, BLOOD
HEMATOCRIT: 23.1 % — AB (ref 39.0–52.0)
Hemoglobin: 7.8 g/dL — ABNORMAL LOW (ref 13.0–17.0)

## 2015-01-24 LAB — GLUCOSE, CAPILLARY
Glucose-Capillary: 151 mg/dL — ABNORMAL HIGH (ref 65–99)
Glucose-Capillary: 170 mg/dL — ABNORMAL HIGH (ref 65–99)
Glucose-Capillary: 172 mg/dL — ABNORMAL HIGH (ref 65–99)
Glucose-Capillary: 176 mg/dL — ABNORMAL HIGH (ref 65–99)
Glucose-Capillary: 178 mg/dL — ABNORMAL HIGH (ref 65–99)
Glucose-Capillary: 178 mg/dL — ABNORMAL HIGH (ref 65–99)

## 2015-01-24 LAB — TYPE AND SCREEN
ABO/RH(D): O NEG
Antibody Screen: NEGATIVE
Unit division: 0
Unit division: 0

## 2015-01-24 LAB — VANCOMYCIN, RANDOM: Vancomycin Rm: 22 ug/mL

## 2015-01-24 LAB — MAGNESIUM: Magnesium: 1.9 mg/dL (ref 1.7–2.4)

## 2015-01-24 LAB — PHOSPHORUS: Phosphorus: 4.5 mg/dL (ref 2.5–4.6)

## 2015-01-24 MED ORDER — LORAZEPAM 2 MG/ML IJ SOLN
1.0000 mg | Freq: Once | INTRAMUSCULAR | Status: AC
  Administered 2015-01-24: 1 mg via INTRAVENOUS

## 2015-01-24 MED ORDER — VANCOMYCIN HCL IN DEXTROSE 1-5 GM/200ML-% IV SOLN
1000.0000 mg | Freq: Once | INTRAVENOUS | Status: AC
  Administered 2015-01-24: 1000 mg via INTRAVENOUS
  Filled 2015-01-24: qty 200

## 2015-01-24 NOTE — Progress Notes (Signed)
Placed ICU bed in chair position twice today.  Patient became exhausted quickly each time.  Sat up for 15 minute intervals. Strongly encouraging the incentive spirometry.

## 2015-01-24 NOTE — Progress Notes (Signed)
PULMONARY / CRITICAL CARE MEDICINE   Name: Larry [name] MRN: [id number] DOB: 1946/01/20    ADMISSION DATE:  01/19/2015 CONSULTATION DATE:  8/4  REFERRING MD :  [name]  CHIEF COMPLAINT:  Septic shock   INITIAL PRESENTATION:  69 year old male w/ CAF on elliquis recently underwent lap chole on 7/14. Was discharged to home w/drain. Drain removed about a week before presentation to ER. Admitted directly from [facility] on 8/3 w/ septic shock which was likely due to infected hematoma in the GB fossa. PCCM asked to see after new perc drain placed on 8/4, when he remained hypotensive.   STUDIES:  CT abdomen/Pelvis W/ 7/28 - Gas & fluid at cholecystectomy site w/o discrete abscess. Min ascites. Small right pleural eff. CT abdomen/pelvis w/o 8/5 - Gas between liver & stomach. No additional free air. Increased flow void & high density layering c/w increasing hemorrhage as well as increased right pleural eff.  SIGNIFICANT EVENTS: Admitted 8/3 w/ abd pain and hypotension  8/4 hypotensive over-night. Went for [facility] drain. Found what looked like old infected hematoma. PCCM asked to see post-op for shock 8/5 off pressors. WOB worse.  8/6 WOB & wheezing slightly worse. Progressing abdominal pain 8/7 Hgb declining w/ increasing intraperit hemorrhage 8/7 Transfused 2u PRBCs  SUBJECTIVE: Reports abdominal pain still 10/10. Did have BM. Denies any nausea except with suction catheter in the back of his throat. No change in dyspnea per patient. No cough. Not using IS as he should be.  ROS:  No chest pain or pressure. No fever or sweats.  VITAL SIGNS: Temp:  [98 F (36.7 C)-98.6 F (37 C)] 98.2 F (36.8 C) (08/08 0808) Pulse Rate:  [90-109] 101 (08/08 0900) Resp:  [20-36] 36 (08/08 0900) BP: (93-136)/(44-66) 135/50 mmHg (08/08 0900) SpO2:  [90 %-100 %] 96 % (08/08 0900) 2 liters HEMODYNAMICS:   VENTILATOR SETTINGS:   INTAKE / OUTPUT:  Intake/Output Summary (Last 24 hours) at  01/24/15 1026 Last data filed at 01/24/15 1000  Gross per 24 hour  Intake   2454 ml  Output    513 ml  Net   1941 ml    PHYSICAL EXAMINATION: General:  Appears uncomfortable. Laying recumbent. Mild distress but unchanged since yesterday. Awake & alert. Neuro:  Oriented x3. Moving all 4 extremities equally.  HEENT:  No scleral icterus. Tacky MM. No oral ulcers. Cardiovascular:  Reg rate & rhythm. No edema. Lungs:  Decreased breath sounds bilateral bases w/ mild wheezing. Breath sounds are distant.  Moderately increased WOB on [place] oxygen unchanged for last 48+ hours. Abdomen:  RUQ perc drain in place. Improved/resolved TTP in LUQ & LLQ. Still TTP RUQ. Hypoactive BS. Protuberant. Musculoskeletal:  No joint effusions or deformities. Skin:  Warm & dry. No rash. No abdominal bruising.  LABS:  CBC  Recent Labs Lab 01/22/15 0453 01/23/15 0610 01/23/15 0754 01/23/15 2230 01/24/15 0425  WBC 15.0* 17.2*  --   --  13.3*  HGB 7.5* 5.9* 5.8* 7.6* 7.4*  HCT 23.5* 17.8* 17.8* 22.4* 22.6*  PLT 294 275  --   --  269   Coag's  Recent Labs Lab 01/19/15 1613 01/23/15 0754  APTT 35 38*  INR 1.46 1.32   BMET  Recent Labs Lab 01/22/15 0453 01/23/15 0731 01/24/15 0425  NA 140 136 137  K 4.2 4.1 4.0  CL 109 105 106  CO2 23 24 22   BUN 31* 38* 49*  CREATININE 1.83* 2.75* 3.17*  GLUCOSE 184* 230*  191*   Electrolytes  Recent Labs Lab 01/19/15 1613  01/22/15 0453 01/23/15 0731 01/24/15 0425  CALCIUM 7.9*  < > 7.6* 7.7* 8.1*  MG 1.8  --   --  1.8 1.9  PHOS  --   --   --  3.9 4.5  < > = values in this interval not displayed. Sepsis Markers  Recent Labs Lab 01/20/15 1420 01/20/15 1550 01/20/15 1807  LATICACIDVEN 1.6 1.8 0.8   ABG  Recent Labs Lab 01/21/15 0809  PHART 7.378  PCO2ART 38.8  PO2ART 68.6*   Liver Enzymes  Recent Labs Lab 01/22/15 0453 01/23/15 0731 01/24/15 0425  AST 43* 30 38  ALT 46 37 39  ALKPHOS 88 81 93  BILITOT 1.5* 1.5* 1.8*  ALBUMIN  2.8* 2.2* 2.3*   Cardiac Enzymes No results for input(s): TROPONINI, PROBNP in the last 168 hours. Glucose  Recent Labs Lab 01/23/15 1253 01/23/15 1645 01/23/15 2008 01/24/15 0010 01/24/15 0451 01/24/15 0814  GLUCAP 189* 202* 184* 176* 170* 151*    Imaging No results found. Elevated Right HD. Right > left patchy infiltrates ASSESSMENT / PLAN:  PULMONARY A: Chronically elevated right HD Evolving patchy pulm infiltrates on right  Acute Hypoxic Respiratory Failure Right pleural effusion - progessing on Abd CT 8/6  P:   Educated patient on technique & frequency of IS Supplemental oxygen for sats > 92% Xopenex neb q6hr Reflux precautions   CARDIOVASCULAR CVL Right IJ 8/4>> A:  Severe sepsis/septic shock - Shock resolved H/o PAF; CHADVASC score 4; currently NSR  Prior NSTEMI   P:  Hold all antihypertensives Monitor in tele  RENAL A:   ARF - Fxn worsening but UOP stable Hyperkalemia - Resolved  P:   Monitor UOP Monitor electrolytes daily Daily BUN/Creatinine MAP goal > 65 Continue MIVF to D5 1/2 NS at 19ml/hr Holding Lasix diuresis at this time  GASTROINTESTINAL A:   Infected hematoma s/p cholecystectomy - Now s/p CT guided Perc drain  GERD - worse 8/5 w/ associated UAW wheeze Intra-abdominal hemorrhage - Hgb stable now  P:   Diet and drain per surgery and IR services  Protonix IV Reflux precautions Clear liquid diet  HEMATOLOGIC A:   Anemia - Acute drop in Hgb this AM w/ intra-abdominal hemorrhage  P:  S/p 2 u PRBC on 8/4 & 2u 8/7 Trend Hgb q12hr SCDs Transfuse for Hbg < 7  Holding Heparin [place]  INFECTIOUS A:   Severe sepsis/septic shock - presume source is infected hematoma from recent Cholecystectomy. Shock resolved.  S/p Perc GB fossa site drain 8/4 - minimal serous output  P:   Trending leukocytosis Monitoring for fever  Zosyn 8/3>>> vanc 8/3>>>  Abscess 8/4>>>neg BCX2 8/4>>>  ENDOCRINE A: DM  P:   SSI Accuchecks  q4hr Hold oral agents  NEUROLOGIC A:  Abdominal Pain/Peritonitis - Pain improving At risk for delirium   P:   RASS goal: n/a Supportive care Fentanyl IV prn   FAMILY  - Updates: Wife not at bedside at the time of my exam. Friend updated at bedside.  - Inter-disciplinary family meet or Palliative Care meeting due by:  8/11  TODAY'S SUMMARY: Renal function continues to remain oliguric & worsen. BP stable & pain improving good signs of response to antibiotic regimen. Respiratory status remains unchanged. Restarting clear liquid diet and recommending chair position. Aggressive pulmonary toilet with proper IS technique today.  I have spent a total of 31 minutes of critical care time today caring for this patient, updating  friend at bedside, & reviewing his EMR.  Donna Christen Jamison Neighbor, M.D. [facility] Pulmonary & Critical Care Pager:  [phone] After 3pm or if no response, call [phone] 01/24/2015 10:26 AM

## 2015-01-24 NOTE — Progress Notes (Signed)
Patient ID: Larry [name], male   DOB: [birth year], 69 y.o.   MRN: [id number]    Referring Physician(s): ccs  Chief Complaint:  GB fossa abscess  Subjective:  Patient continues to complain of moderate to severe abdominal pain primarily in right upper quadrant. Denies nausea /vomiting. Has had normal BMs.   Allergies: Review of patient's allergies indicates no known allergies.  Medications: Prior to Admission medications   Medication Sig Start Date End Date Taking? Authorizing Provider  allopurinol (ZYLOPRIM) 300 MG tablet Take 300 mg by mouth daily.   Yes Historical Provider, MD  amLODipine (NORVASC) 10 MG tablet Take 10 mg by mouth every morning.    Yes Historical Provider, MD  apixaban (ELIQUIS) 5 MG TABS tablet Take 1 tablet (5 mg total) by mouth 2 (two) times daily. 10/01/14  Yes April Palumbo, MD  aspirin 81 MG tablet Take 81 mg by mouth daily.   Yes Historical Provider, MD  atorvastatin (LIPITOR) 80 MG tablet Take 80 mg by mouth at bedtime.    Yes Historical Provider, MD  Cholecalciferol (VITAMIN D PO) Take 2,000 Units by mouth daily.    Yes Historical Provider, MD  citalopram (CELEXA) 40 MG tablet Take 40 mg by mouth every morning.    Yes Historical Provider, MD  furosemide (LASIX) 40 MG tablet Take 40 mg by mouth every morning.    Yes Historical Provider, MD  glucose 4 GM chewable tablet Chew 1 tablet by mouth as needed for low blood sugar (only if BS IS BELOW 70).   Yes Historical Provider, MD  insulin glargine (LANTUS) 100 UNIT/ML injection Inject 40 Units into the skin at bedtime.   Yes Historical Provider, MD  magnesium oxide (MAG-OX) 400 MG tablet Take 400 mg by mouth 2 (two) times daily.    Yes Historical Provider, MD  metFORMIN (GLUCOPHAGE) 500 MG tablet Take 500 mg by mouth 2 (two) times daily with a meal.   Yes Historical Provider, MD  oxyCODONE-acetaminophen (PERCOCET/ROXICET) 5-325 MG per tablet Take 1-2 tablets by mouth every 4 (four) hours as needed for moderate  pain. 01/05/15  Yes [name], MD  pantoprazole (PROTONIX) 40 MG tablet Take 40 mg by mouth daily.   Yes Historical Provider, MD  vitamin B-12 (CYANOCOBALAMIN) 500 MCG tablet Take 500 mcg by mouth daily.   Yes Historical Provider, MD  Liraglutide 18 MG/3ML SOPN Inject 1.2 mg into the skin daily. Pt states has been on hold since discharged 10-06-14 hospital visit    Historical Provider, MD  lisinopril (PRINIVIL,ZESTRIL) 40 MG tablet Take 40 mg by mouth 2 (two) times daily. On hold since 10-06-14    Historical Provider, MD  ondansetron (ZOFRAN) 8 MG tablet Take 8 mg by mouth 2 (two) times daily. Not taking-on hold form 10-06-14    Historical Provider, MD     Vital Signs: BP 122/61 mmHg  Pulse 100  Temp(Src) 98.2 F (36.8 C) (Oral)  Resp 23  Ht  (1.676 m)  Wt 268 lb 8.3 oz (121.8 kg)  BMI 43.36 kg/m2  SpO2 97%  Physical Exam patient awake but slightly lethargic; appears uncomfortable. Sister in room; chest with diminished breath sounds at bases and wheezing; abdomen distended; few bowel sounds; right upper quadrant drain intact, insertion site okay, mildly tender to palpation, output minimal blood tinged fluid; saline infuses well but unable to aspirate fluid back Imaging: Ct Abdomen Pelvis Wo Contrast  01/22/2015   CLINICAL DATA:  RIGHT lower quadrant and LEFT lower quadrant pain for  2 days, abscess check, post CT-guided abscess drainage  EXAM: CT ABDOMEN AND PELVIS WITHOUT CONTRAST  TECHNIQUE: Multidetector CT imaging of the abdomen and pelvis was performed following the standard protocol without IV contrast. Oral contrast was not administered. Sagittal and coronal MPR images reconstructed from axial data set.  COMPARISON:  01/19/2015  FINDINGS: Bibasilar pleural effusions and atelectasis greater on RIGHT.  Scattered atherosclerotic calcifications including coronary arteries.  Percutaneous drainage catheter identified within the large abscess containing gas and fluid located at the  gallbladder fossa.  Collection has increased in size since the previous exam, currently 10.0 x 10.2 x 9.6 cm, previously  5.8 x 9.3 x 6.1 cm, portions now higher in attenuation question interval hemorrhage.  Additionally, a new higher attenuation collection is seen surrounding the inferior aspect of the RIGHT lobe of the liver, containing higher attenuation posteriorly in inferiorly and lower in attenuation anteriorly and superiorlylikely hemoperitoneum with hematocrit layering, measuring 11.4 x 9.9 x ~7 cm in maximal dimensions.  Increased ascites.  Single tiny focus of gas identified between the liver and stomach image 29, nonspecific in the setting of percutaneous drain placement 2 days prior.  Spleen, pancreas, kidneys, and adrenal glands stable.  Bowel wall thickening of the splenic flexure and distal ascending colon again identified.  Stomach and remaining bowel loops unremarkable.  Scattered atherosclerotic calcifications.  No mass, adenopathy, additional free air, or hernia.  Scattered atherosclerotic calcifications.  Significant beam hardening artifacts from RIGHT hip prosthesis.  No acute osseous findings.  Significant scattered subcutaneous edema at both flanks.  IMPRESSION: Post percutaneous drainage 's of a gallbladder fossa abscess collection, collection now larger and higher in attenuation than on the previous study question interval hemorrhage.  Additionally, increased flow void with high density layering noted perihepatic compatible with increased intraperitoneal hemorrhage.  Increased ascites.  Mild increase in RIGHT pleural effusion and basilar atelectasis.  Findings called to Dr. Gerrit Friends on 01/22/2015 at 1704 hr.   Electronically Signed   By: Ulyses Southward M.D.   On: 01/22/2015 17:06   Dg Chest Port 1 View  01/21/2015   CLINICAL DATA:  Shortness of breath, atrial fibrillation, hypertension, diabetes mellitus, history coronary artery disease post MI  EXAM: PORTABLE CHEST - 1 VIEW  COMPARISON:   Portable exam 0810 hours compared to 01/20/2015  FINDINGS: RIGHT jugular central venous catheter with tip projecting over cavoatrial junction or high RIGHT atrium.  Borderline enlargement of cardiac silhouette.  Mediastinal contours and pulmonary vascularity normal.  Persistent elevation of RIGHT diaphragm with RIGHT basilar atelectasis.  Lungs otherwise clear.  No pleural effusion or pneumothorax.  Diffuse osseous demineralization.  Chronic RIGHT rotator cuff tear.  IMPRESSION: Persistent RIGHT basilar atelectasis.   Electronically Signed   By: Ulyses Southward M.D.   On: 01/21/2015 08:33   Dg Chest Port 1 View  01/20/2015   CLINICAL DATA:  Sepsis  EXAM: PORTABLE CHEST - 1 VIEW  COMPARISON:  10/01/2014  FINDINGS: New right IJ central line. The tip is at the level of the central right atrium. Hypoventilation exacerbates inferior tip positioning.  Streaky lower lung opacities, greater on the right is confirmed by CT earlier today, with small right pleural effusion.  Right upper quadrant percutaneous drain noted.  No pneumothorax.  Stable mild cardiomegaly.  IMPRESSION: 1. New right IJ central line with tip at the right atrial level. No pneumothorax. 2. Low lung volumes with bibasilar atelectasis or pneumonia.   Electronically Signed   By: Marnee Spring M.D.   On:  01/20/2015 17:08   Ct Image Guided Drainage Percut Cath  Peritoneal Retroperit  01/20/2015   CLINICAL DATA:  GALLBLADDER FOSSA AIR-FLUID COLLECTION, SEPSIS, CONCERN FOR INTRA-ABDOMINAL ABSCESS  EXAM: CT GUIDED DRAINAGE CATHETER INSERTION OF THE GALLBLADDER FOSSA ABSCESS  ANESTHESIA/SEDATION: NO SEDATION.  PATIENT IS HYPOTENSIVE.  1% lidocaine locally.  PROCEDURE: The procedure risks, benefits, and alternatives were explained to the patient. Questions regarding the procedure were encouraged and answered. The patient understands and consents to the procedure.  The right upper quadrant was prepped with ChloraPrepin a sterile fashion, and a sterile drape was  applied covering the operative field. A sterile gown and sterile gloves were used for the procedure. Local anesthesia was provided with 1% Lidocaine.  Previous imaging reviewed. Patient positioned supine. Noncontrast localization CT performed. The heterogeneous hyperdense air collection in the gallbladder fossa was localized. Under sterile conditions and local anesthesia, an 18 gauge 15 cm access needle was advanced from an anterior oblique intercostal approach into the gallbladder fossa collection. Needle position confirmed with CT. Guidewire inserted followed by tract dilatation to insert a 12 [place] drain. Drain catheter position confirmed with CT. Syringe aspiration yielded dark thick bloody fluid, suspect infected hematoma. Sample sent for Gram stain and culture. Catheter secured with a Prolene suture and connected to external suction bulb. Sterile dressing applied to the site.  Complications: None immediate  FINDINGS: Imaging confirms CT-guided needle access of the gallbladder fossa heterogeneous air-fluid collection for drain insertion. Infected hematoma suspected. Gram stain and culture pending.  IMPRESSION: Successful CT-guided gallbladder fossa abscess drain insertion as above.   Electronically Signed   By: Judie Petit.  Shick M.D.   On: 01/20/2015 15:46    Labs:  CBC:  Recent Labs  01/21/15 0650 01/22/15 0453 01/23/15 0610 01/23/15 0754 01/23/15 2230 01/24/15 0425  WBC 16.8* 15.0* 17.2*  --   --  13.3*  HGB 7.6* 7.5* 5.9* 5.8* 7.6* 7.4*  HCT 23.0* 23.5* 17.8* 17.8* 22.4* 22.6*  PLT 301 294 275  --   --  269    COAGS:  Recent Labs  10/06/14 0445 01/19/15 1613 01/23/15 0754  INR 1.07 1.46 1.32  APTT 34 35 38*    BMP:  Recent Labs  01/21/15 1445 01/22/15 0453 01/23/15 0731 01/24/15 0425  NA 140 140 136 137  K 4.1 4.2 4.1 4.0  CL 109 109 105 106  CO2 24 23 24 22   GLUCOSE 182* 184* 230* 191*  BUN 36* 31* 38* 49*  CALCIUM 7.4* 7.6* 7.7* 8.1*  CREATININE 1.90* 1.83* 2.75*  3.17*  GFRNONAA 35* 36* 22* 19*  GFRAA 40* 42* 26* 22*    LIVER FUNCTION TESTS:  Recent Labs  01/21/15 0750 01/22/15 0453 01/23/15 0731 01/24/15 0425  BILITOT 1.6* 1.5* 1.5* 1.8*  AST 44* 43* 30 38  ALT 42 46 37 39  ALKPHOS 62 88 81 93  PROT 5.7* 6.1* 5.6* 5.8*  ALBUMIN 2.7* 2.8* 2.2* 2.3*    Assessment and Plan: Patient status post lap cholecystectomy on 7/14 with surgical drain placed (SNOW placed in GB bed during surgery) and removed last week; admitted now with sepsis and gallbladder fossa abscess/hematoma, drained 8/4; follow-up CT 8/6 revealed increased size of gallbladder fossa collection with additional interval hemorrhage , both perihepatic and intraperitoneal, increased ascites. Patient currently afebrile with stable BP. Hemoglobin today 7.4 following recent transfusion. WBC 13.3, down from 17.2, creatinine 3.17, previously 2.75; GB fossa abscess revealed multiple organisms, none predominant; blood cultures negative to date. Will plan to review latest  imaging studies with Dr. Grace Isaac. Additional plans as per CCM/CCS.   Signed: D. Jeananne Rama 01/24/2015, 11:57 AM   I spent a total of 15 minutes in face to face in clinical consultation/evaluation, greater than 50% of which was counseling/coordinating care for gallbladder fossa abscess/hematoma drainage

## 2015-01-24 NOTE — Care Management Important Message (Signed)
Important Message  Patient Details  Name: Larry [name] MRN: [id number] Date of Birth: 1945/08/11   Medicare Important Message Given:  Yes-third notification given    Haskell Flirt 01/24/2015, 12:04 PMImportant Message  Patient Details  Name: Larry [name] MRN: [id number] Date of Birth: 1946/01/31   Medicare Important Message Given:  Yes-third notification given    Haskell Flirt 01/24/2015, 12:04 PM

## 2015-01-24 NOTE — Progress Notes (Addendum)
Patient ID: Larry [name], male   DOB: 05/03/1946, 69 y.o.   MRN: [id number]  General Surgery - [facility]  HD#: 5  Subjective: Patient up in bed, complains of RUQ pain.  Wife at bedside.  Continued tachypnea and subjective SOB.  Has not been OOB over weekend.  Received 2 U PRBC's last night.  Objective: Vital signs in last 24 hours: Temp:  [98 F (36.7 C)-99.1 F (37.3 C)] 98.2 F (36.8 C) (08/08 0500) Pulse Rate:  [90-109] 97 (08/08 0747) Resp:  [20-31] 20 (08/08 0747) BP: (92-136)/(44-66) 136/66 mmHg (08/08 0747) SpO2:  [90 %-100 %] 94 % (08/08 0747) Last BM Date: 01/23/15  Intake/Output from previous day: 08/07 0701 - 08/08 0700 In: 2414 [I.V.:1450; Blood:764; IV Piggyback:150] Out: 565 [Urine:560; Drains:5] Intake/Output this shift:    Physical Exam: HEENT - sclerae clear, mucous membranes moist Neck - soft Chest - shallow bilaterally, wheeze on left Cor - RRR Abdomen - obese, softer; active BS present; moderate tenderness RUQ; drain with thin serous Ext - no edema, non-tender Neuro - alert & oriented, no focal deficits  Lab Results:   Recent Labs  01/23/15 0610  01/23/15 2230 01/24/15 0425  WBC 17.2*  --   --  13.3*  HGB 5.9*  < > 7.6* 7.4*  HCT 17.8*  < > 22.4* 22.6*  PLT 275  --   --  269  < > = values in this interval not displayed. BMET  Recent Labs  01/23/15 0731 01/24/15 0425  NA 136 137  K 4.1 4.0  CL 105 106  CO2 24 22  GLUCOSE 230* 191*  BUN 38* 49*  CREATININE 2.75* 3.17*  CALCIUM 7.7* 8.1*   PT/INR  Recent Labs  01/23/15 0754  LABPROT 16.5*  INR 1.32   Comprehensive Metabolic Panel:    Component Value Date/Time   NA 137 01/24/2015 0425   NA 136 01/23/2015 0731   K 4.0 01/24/2015 0425   K 4.1 01/23/2015 0731   CL 106 01/24/2015 0425   CL 105 01/23/2015 0731   CO2 22 01/24/2015 0425   CO2 24 01/23/2015 0731   BUN 49* 01/24/2015 0425   BUN 38* 01/23/2015 0731   CREATININE 3.17* 01/24/2015 0425    CREATININE 2.75* 01/23/2015 0731   GLUCOSE 191* 01/24/2015 0425   GLUCOSE 230* 01/23/2015 0731   CALCIUM 8.1* 01/24/2015 0425   CALCIUM 7.7* 01/23/2015 0731   AST 38 01/24/2015 0425   AST 30 01/23/2015 0731   ALT 39 01/24/2015 0425   ALT 37 01/23/2015 0731   ALKPHOS 93 01/24/2015 0425   ALKPHOS 81 01/23/2015 0731   BILITOT 1.8* 01/24/2015 0425   BILITOT 1.5* 01/23/2015 0731   PROT 5.8* 01/24/2015 0425   PROT 5.6* 01/23/2015 0731   ALBUMIN 2.3* 01/24/2015 0425   ALBUMIN 2.2* 01/23/2015 0731    Studies/Results: Ct Abdomen Pelvis Wo Contrast  01/22/2015   CLINICAL DATA:  RIGHT lower quadrant and LEFT lower quadrant pain for 2 days, abscess check, post CT-guided abscess drainage  EXAM: CT ABDOMEN AND PELVIS WITHOUT CONTRAST  TECHNIQUE: Multidetector CT imaging of the abdomen and pelvis was performed following the standard protocol without IV contrast. Oral contrast was not administered. Sagittal and coronal MPR images reconstructed from axial data set.  COMPARISON:  01/19/2015  FINDINGS: Bibasilar pleural effusions and atelectasis greater on RIGHT.  Scattered atherosclerotic calcifications including coronary arteries.  Percutaneous drainage catheter identified within the large abscess containing gas and fluid located at the  gallbladder fossa.  Collection has increased in size since the previous exam, currently 10.0 x 10.2 x 9.6 cm, previously  5.8 x 9.3 x 6.1 cm, portions now higher in attenuation question interval hemorrhage.  Additionally, a new higher attenuation collection is seen surrounding the inferior aspect of the RIGHT lobe of the liver, containing higher attenuation posteriorly in inferiorly and lower in attenuation anteriorly and superiorlylikely hemoperitoneum with hematocrit layering, measuring 11.4 x 9.9 x ~7 cm in maximal dimensions.  Increased ascites.  Single tiny focus of gas identified between the liver and stomach image 29, nonspecific in the setting of percutaneous drain  placement 2 days prior.  Spleen, pancreas, kidneys, and adrenal glands stable.  Bowel wall thickening of the splenic flexure and distal ascending colon again identified.  Stomach and remaining bowel loops unremarkable.  Scattered atherosclerotic calcifications.  No mass, adenopathy, additional free air, or hernia.  Scattered atherosclerotic calcifications.  Significant beam hardening artifacts from RIGHT hip prosthesis.  No acute osseous findings.  Significant scattered subcutaneous edema at both flanks.  IMPRESSION: Post percutaneous drainage 's of a gallbladder fossa abscess collection, collection now larger and higher in attenuation than on the previous study question interval hemorrhage.  Additionally, increased flow void with high density layering noted perihepatic compatible with increased intraperitoneal hemorrhage.  Increased ascites.  Mild increase in RIGHT pleural effusion and basilar atelectasis.  Findings called to Dr. Gerrit Friends on 01/22/2015 at 1704 hr.   Electronically Signed   By: Ulyses Southward M.D.   On: 01/22/2015 17:06    Anti-infectives: Anti-infectives    Start     Dose/Rate Route Frequency Ordered Stop   01/21/15 2000  vancomycin (VANCOCIN) 1,500 mg in sodium chloride 0.9 % 500 mL IVPB  Status:  Discontinued     1,500 mg 250 mL/hr over 120 Minutes Intravenous Every 24 hours 01/21/15 1048 01/23/15 1726   01/20/15 2000  vancomycin (VANCOCIN) 1,250 mg in sodium chloride 0.9 % 250 mL IVPB  Status:  Discontinued     1,250 mg 166.7 mL/hr over 90 Minutes Intravenous Every 24 hours 01/19/15 1945 01/20/15 0843   01/20/15 2000  vancomycin (VANCOCIN) 1,250 mg in sodium chloride 0.9 % 250 mL IVPB  Status:  Discontinued     1,250 mg 166.7 mL/hr over 90 Minutes Intravenous Every 24 hours 01/20/15 1519 01/21/15 1048   01/20/15 0000  piperacillin-tazobactam (ZOSYN) IVPB 3.375 g     3.375 g 12.5 mL/hr over 240 Minutes Intravenous Every 8 hours 01/19/15 1946     01/19/15 1715  vancomycin (VANCOCIN)  2,500 mg in sodium chloride 0.9 % 500 mL IVPB     2,500 mg 250 mL/hr over 120 Minutes Intravenous  Once 01/19/15 1707 01/19/15 2159   01/19/15 1715  piperacillin-tazobactam (ZOSYN) IVPB 3.375 g     3.375 g 100 mL/hr over 30 Minutes Intravenous  Once 01/19/15 1707 01/19/15 1900      Assessment & Plans: Sepsis syndrome Intraabdominal abscess (subhepatic) with peritonitis - perc drain placement  IV Zosyn/Vancomycin  Improving GI function - may allow clear liquids as tolerated Acute blood loss anemia  2 U PRBC administered last night with appropriate rise in Hgb to 7.4 S/p laparoscopic cholecystectomy 12/30/14 Acute on chronic renal insufficiency  Will follow closely.   Velora Heckler, MD, [facility] Office: [phone]   Leaha Cuervo Judie Petit 01/24/2015
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
## 2015-01-24 NOTE — Progress Notes (Signed)
eLink Physician-Brief Progress Note Patient Name: Larry [name] DOB: 1946-05-14 MRN: [id number]   Date of Service  01/24/2015  HPI/Events of Note  Called because patient having a panic attack, associated dyspnea. Not yet time for his prn ativan dose  eICU Interventions  Ativan x 1 given now     Intervention Category Minor Interventions: Agitation / anxiety - evaluation and management  Davene Jobin S. 01/24/2015, 10:01 PM

## 2015-01-24 NOTE — Progress Notes (Signed)
Date:  January 24, 2015 U.R. performed for needs and level of care. Will continue to follow for Case Management needs.  Rhonda Davis, RN, BSN, CCM   [phone] 

## 2015-01-25 LAB — CBC WITH DIFFERENTIAL/PLATELET
Basophils Absolute: 0 10*3/uL (ref 0.0–0.1)
Basophils Relative: 0 % (ref 0–1)
EOS ABS: 0.2 10*3/uL (ref 0.0–0.7)
Eosinophils Relative: 2 % (ref 0–5)
HEMATOCRIT: 23 % — AB (ref 39.0–52.0)
Hemoglobin: 7.6 g/dL — ABNORMAL LOW (ref 13.0–17.0)
LYMPHS PCT: 5 % — AB (ref 12–46)
Lymphs Abs: 0.6 10*3/uL — ABNORMAL LOW (ref 0.7–4.0)
MCH: 29.5 pg (ref 26.0–34.0)
MCHC: 33 g/dL (ref 30.0–36.0)
MCV: 89.1 fL (ref 78.0–100.0)
MONO ABS: 1.2 10*3/uL — AB (ref 0.1–1.0)
MONOS PCT: 10 % (ref 3–12)
NEUTROS PCT: 83 % — AB (ref 43–77)
Neutro Abs: 10.8 10*3/uL — ABNORMAL HIGH (ref 1.7–7.7)
Platelets: 316 10*3/uL (ref 150–400)
RBC: 2.58 MIL/uL — ABNORMAL LOW (ref 4.22–5.81)
RDW: 17.3 % — AB (ref 11.5–15.5)
WBC: 12.9 10*3/uL — AB (ref 4.0–10.5)

## 2015-01-25 LAB — COMPREHENSIVE METABOLIC PANEL
ALT: 35 U/L (ref 17–63)
AST: 28 U/L (ref 15–41)
Albumin: 2.1 g/dL — ABNORMAL LOW (ref 3.5–5.0)
Alkaline Phosphatase: 93 U/L (ref 38–126)
Anion gap: 8 (ref 5–15)
BUN: 50 mg/dL — ABNORMAL HIGH (ref 6–20)
CALCIUM: 8 mg/dL — AB (ref 8.9–10.3)
CO2: 24 mmol/L (ref 22–32)
CREATININE: 3.32 mg/dL — AB (ref 0.61–1.24)
Chloride: 106 mmol/L (ref 101–111)
GFR calc non Af Amer: 18 mL/min — ABNORMAL LOW (ref >60)
GFR, EST AFRICAN AMERICAN: 20 mL/min — AB (ref >60)
Glucose, Bld: 192 mg/dL — ABNORMAL HIGH (ref 65–99)
POTASSIUM: 4.1 mmol/L (ref 3.5–5.1)
Sodium: 138 mmol/L (ref 135–145)
Total Bilirubin: 1.5 mg/dL — ABNORMAL HIGH (ref 0.3–1.2)
Total Protein: 6 g/dL — ABNORMAL LOW (ref 6.5–8.1)

## 2015-01-25 LAB — GLUCOSE, CAPILLARY
GLUCOSE-CAPILLARY: 191 mg/dL — AB (ref 65–99)
GLUCOSE-CAPILLARY: 162 mg/dL — AB (ref 65–99)
GLUCOSE-CAPILLARY: 144 mg/dL — AB (ref 65–99)
Glucose-Capillary: 178 mg/dL — ABNORMAL HIGH (ref 65–99)
Glucose-Capillary: 162 mg/dL — ABNORMAL HIGH (ref 65–99)
Glucose-Capillary: 152 mg/dL — ABNORMAL HIGH (ref 65–99)

## 2015-01-25 LAB — MAGNESIUM: MAGNESIUM: 1.9 mg/dL (ref 1.7–2.4)

## 2015-01-25 LAB — CULTURE, BLOOD (ROUTINE X 2)
CULTURE: NO GROWTH
Culture: NO GROWTH

## 2015-01-25 LAB — HEMOGLOBIN AND HEMATOCRIT, BLOOD
HCT: 23.5 % — ABNORMAL LOW (ref 39.0–52.0)
Hemoglobin: 7.7 g/dL — ABNORMAL LOW (ref 13.0–17.0)

## 2015-01-25 LAB — PHOSPHORUS: Phosphorus: 4.4 mg/dL (ref 2.5–4.6)

## 2015-01-25 MED ORDER — CHLORHEXIDINE GLUCONATE 0.12 % MT SOLN
OROMUCOSAL | Status: AC
  Filled 2015-01-25: qty 15

## 2015-01-25 MED ORDER — FUROSEMIDE 10 MG/ML IJ SOLN
40.0000 mg | Freq: Once | INTRAMUSCULAR | Status: AC
  Administered 2015-01-25: 40 mg via INTRAVENOUS
  Filled 2015-01-25: qty 4

## 2015-01-25 NOTE — Progress Notes (Signed)
Referring Physician(s): CCS  Chief Complaint: GB fossa abscess s/p perc drain 8/4  Subjective: Patient on Bipap, family in room.   Allergies: Review of patient's allergies indicates no known allergies.  Medications: Prior to Admission medications   Medication Sig Start Date End Date Taking? Authorizing Provider  allopurinol (ZYLOPRIM) 300 MG tablet Take 300 mg by mouth daily.   Yes Historical Provider, MD  amLODipine (NORVASC) 10 MG tablet Take 10 mg by mouth every morning.    Yes Historical Provider, MD  apixaban (ELIQUIS) 5 MG TABS tablet Take 1 tablet (5 mg total) by mouth 2 (two) times daily. 10/01/14  Yes April Palumbo, MD  aspirin 81 MG tablet Take 81 mg by mouth daily.   Yes Historical Provider, MD  atorvastatin (LIPITOR) 80 MG tablet Take 80 mg by mouth at bedtime.    Yes Historical Provider, MD  Cholecalciferol (VITAMIN D PO) Take 2,000 Units by mouth daily.    Yes Historical Provider, MD  citalopram (CELEXA) 40 MG tablet Take 40 mg by mouth every morning.    Yes Historical Provider, MD  furosemide (LASIX) 40 MG tablet Take 40 mg by mouth every morning.    Yes Historical Provider, MD  glucose 4 GM chewable tablet Chew 1 tablet by mouth as needed for low blood sugar (only if BS IS BELOW 70).   Yes Historical Provider, MD  insulin glargine (LANTUS) 100 UNIT/ML injection Inject 40 Units into the skin at bedtime.   Yes Historical Provider, MD  magnesium oxide (MAG-OX) 400 MG tablet Take 400 mg by mouth 2 (two) times daily.    Yes Historical Provider, MD  metFORMIN (GLUCOPHAGE) 500 MG tablet Take 500 mg by mouth 2 (two) times daily with a meal.   Yes Historical Provider, MD  oxyCODONE-acetaminophen (PERCOCET/ROXICET) 5-325 MG per tablet Take 1-2 tablets by mouth every 4 (four) hours as needed for moderate pain. 01/05/15  Yes [name], MD  pantoprazole (PROTONIX) 40 MG tablet Take 40 mg by mouth daily.   Yes Historical Provider, MD  vitamin B-12 (CYANOCOBALAMIN) 500 MCG tablet  Take 500 mcg by mouth daily.   Yes Historical Provider, MD  Liraglutide 18 MG/3ML SOPN Inject 1.2 mg into the skin daily. Pt states has been on hold since discharged 10-06-14 hospital visit    Historical Provider, MD  lisinopril (PRINIVIL,ZESTRIL) 40 MG tablet Take 40 mg by mouth 2 (two) times daily. On hold since 10-06-14    Historical Provider, MD  ondansetron (ZOFRAN) 8 MG tablet Take 8 mg by mouth 2 (two) times daily. Not taking-on hold form 10-06-14    Historical Provider, MD     Vital Signs: BP 107/60 mmHg  Pulse 96  Temp(Src) 97.1 F (36.2 C) (Oral)  Resp 23  Ht 5\' 6"  (1.676 m)  Wt 268 lb 8.3 oz (121.8 kg)  BMI 43.36 kg/m2  SpO2 100%  Physical Exam General: on Bipap Abd: Distended, RUQ drain intact with < 5cc in bulb serosang  Imaging: Ct Abdomen Pelvis Wo Contrast  01/22/2015   CLINICAL DATA:  RIGHT lower quadrant and LEFT lower quadrant pain for 2 days, abscess check, post CT-guided abscess drainage  EXAM: CT ABDOMEN AND PELVIS WITHOUT CONTRAST  TECHNIQUE: Multidetector CT imaging of the abdomen and pelvis was performed following the standard protocol without IV contrast. Oral contrast was not administered. Sagittal and coronal MPR images reconstructed from axial data set.  COMPARISON:  01/19/2015  FINDINGS: Bibasilar pleural effusions and atelectasis greater on RIGHT.  Scattered  atherosclerotic calcifications including coronary arteries.  Percutaneous drainage catheter identified within the large abscess containing gas and fluid located at the gallbladder fossa.  Collection has increased in size since the previous exam, currently 10.0 x 10.2 x 9.6 cm, previously  5.8 x 9.3 x 6.1 cm, portions now higher in attenuation question interval hemorrhage.  Additionally, a new higher attenuation collection is seen surrounding the inferior aspect of the RIGHT lobe of the liver, containing higher attenuation posteriorly in inferiorly and lower in attenuation anteriorly and superiorlylikely  hemoperitoneum with hematocrit layering, measuring 11.4 x 9.9 x ~7 cm in maximal dimensions.  Increased ascites.  Single tiny focus of gas identified between the liver and stomach image 29, nonspecific in the setting of percutaneous drain placement 2 days prior.  Spleen, pancreas, kidneys, and adrenal glands stable.  Bowel wall thickening of the splenic flexure and distal ascending colon again identified.  Stomach and remaining bowel loops unremarkable.  Scattered atherosclerotic calcifications.  No mass, adenopathy, additional free air, or hernia.  Scattered atherosclerotic calcifications.  Significant beam hardening artifacts from RIGHT hip prosthesis.  No acute osseous findings.  Significant scattered subcutaneous edema at both flanks.  IMPRESSION: Post percutaneous drainage 's of a gallbladder fossa abscess collection, collection now larger and higher in attenuation than on the previous study question interval hemorrhage.  Additionally, increased flow void with high density layering noted perihepatic compatible with increased intraperitoneal hemorrhage.  Increased ascites.  Mild increase in RIGHT pleural effusion and basilar atelectasis.  Findings called to Dr. Gerrit Friends on 01/22/2015 at 1704 hr.   Electronically Signed   By: Ulyses Southward M.D.   On: 01/22/2015 17:06    Labs:  CBC:  Recent Labs  01/22/15 0453 01/23/15 0610  01/24/15 0425 01/24/15 1520 01/25/15 0440 01/25/15 1500  WBC 15.0* 17.2*  --  13.3*  --  12.9*  --   HGB 7.5* 5.9*  < > 7.4* 7.8* 7.6* 7.7*  HCT 23.5* 17.8*  < > 22.6* 23.1* 23.0* 23.5*  PLT 294 275  --  269  --  316  --   < > = values in this interval not displayed.  COAGS:  Recent Labs  10/06/14 0445 01/19/15 1613 01/23/15 0754  INR 1.07 1.46 1.32  APTT 34 35 38*    BMP:  Recent Labs  01/22/15 0453 01/23/15 0731 01/24/15 0425 01/25/15 0440  NA 140 136 137 138  K 4.2 4.1 4.0 4.1  CL 109 105 106 106  CO2 GLUCOSE 184* 230* 191* 192*  BUN 31*  38* 49* 50*  CALCIUM 7.6* 7.7* 8.1* 8.0*  CREATININE 1.83* 2.75* 3.17* 3.32*  GFRNONAA 36* 22* 19* 18*  GFRAA 42* 26* 22* 20*    LIVER FUNCTION TESTS:  Recent Labs  01/22/15 0453 01/23/15 0731 01/24/15 0425 01/25/15 0440  BILITOT 1.5* 1.5* 1.8* 1.5*  AST 43* 30 38 28  ALT 46 37 39 35  ALKPHOS 88 81 93 93  PROT 6.1* 5.6* 5.8* 6.0*  ALBUMIN 2.8* 2.2* 2.3* 2.1*    Assessment and Plan: S/p lap cholecystectomy 7/19 Sepsis and GB fossa abscess S/p perc drain placed 8/4 F/U CT 8/6 with changes of interval hemorrhage, collection increased in size  Output minimal < 10cc, RN flushing, wbc slowly trending down, H/H stable, afebrile Discussed with Dr. Bonnielee Haff no further drain manipulation at this time since recent hemorrhage, continue flushes and repeat CT in 1 week Plans per CCS/CCM   Signed: Berneta Levins 01/25/2015, 3:57  PM   I spent a total of 15 Minutes in face to face in clinical consultation/evaluation, greater than 50% of which was counseling/coordinating care for GB fossa abscess.

## 2015-01-25 NOTE — Progress Notes (Signed)
1250 cc of cloudy urine with sediment dumped from foley for this 12 hour shift. Patient's catheter leaked 3x throughout shift with large amounts of urine that soaked the bed pad so unable to account for this amount. Urine output is greater than what I&O reflects for this shift.

## 2015-01-25 NOTE — Progress Notes (Signed)
Nutrition Follow-up  DOCUMENTATION CODES:   Morbid obesity  INTERVENTION:  - If pt unable to advance beyond CLD and/or if BiPAP is needed continuously, recommend Panda tube with Vital AF 1.2 @ 60 mL/hr to provide 1728 kcal (97% minimum needs), 108 grams protein, and 1168 mL free water. - RD will continue to monitor for needs  NUTRITION DIAGNOSIS:   Inadequate oral intake related to inability to eat as evidenced by NPO status. -mainly ongoing with very minimal intakes on CLD  GOAL:   Patient will meet greater than or equal to 90% of their needs -unmet  MONITOR:   Diet advancement, Weight trends, Labs, I & O's  ASSESSMENT:   69 y/o admitted in 10/05/2012 with Sepsis, hypotension, acute renal failure and acute cholecystitis. He had been place on Eliquis for his AF by the [place]. He had an MI 2 years ago and did not follow up with his cardiologist. He was acutely ill and underwent a percutaneous drain placement on 10/06/14 by IR. He went home on 10/11/14. He was readmitted on 12/30/14 and underwent laparoscopic cholecystectomy with IOC. He was left with a drain in place and the site had "SNOW' placed in the GB fossa for hemostasis after the procedure. Drain was removed last week but there was some brusing at the site. He is transferred from [facility] with complaints of increased abdominal pain.  8/9 Pt admitted 8/3 and on CLD or NPO since that date (6 days). Even on CLD pt has consumed very little and is unable to meet nutritional needs. Visualized untouched lunch tray at time of visit and 2 RNs assisting pt with BiPAP.   CCM notes indicate oliguric renal failure and Hgb stable; pt previously with blood in abdomen.  Recommend Panda tube with TF as outlined above if possible. If unable to feed pt enterally, recommend TPN.   Medications reviewed. Labs reviewed; CBGs: 151-191 mg/dL, BUN/creatinine trending up, Ca: 8 mg/dL, GFR: 18.  8/4 - Pt has had a poor appetite for 1 month PTA and las  good meal was 8/2 - N/V with unknown start date and complaining of 10/10 pain recently  Diet Order:  Diet clear liquid Room service appropriate?: Yes; Fluid consistency:: Thin  Skin:  Wound (see comment) (abdominal wound (first recorded 01/04/15))  Last BM:  8/8  Height:   Ht Readings from Last 1 Encounters:  01/20/15  (1.676 m)    Weight:   Wt Readings from Last 1 Encounters:  01/20/15 268 lb 8.3 oz (121.8 kg)    Ideal Body Weight:  64.54 kg (kg)  BMI:  Body mass index is 43.36 kg/(m^2).  Estimated Nutritional Needs:   Kcal:  1610-9604  Protein:  95-105 grams  Fluid:  1.7-2.2 L/day  EDUCATION NEEDS:   No education needs identified at this time    Trenton Gammon, RD, LDN Inpatient Clinical Dietitian Pager # [phone] After hours/weekend pager # [phone]

## 2015-01-25 NOTE — Progress Notes (Signed)
RT called to bedside to place BiPAP back on patient.   Patient was placed on previous settings of 12/6 and 40%. Patient vitals are stable and patient is comfortable. Patient WOB decreased with BiPAP and patient appears more comfortable while using BiPAP. RT will continue to monitor patient.

## 2015-01-25 NOTE — Progress Notes (Signed)
Pt removed BIPAP mask and said that he did not want to be on BIPAP at this time.  CCM MD informed.

## 2015-01-25 NOTE — Progress Notes (Signed)
PULMONARY / CRITICAL CARE MEDICINE   Name: Larry [name] MRN: [id number] DOB: 03-05-1946    ADMISSION DATE:  01/19/2015 CONSULTATION DATE:  8/4  REFERRING MD :  [name]  CHIEF COMPLAINT:  Septic shock   INITIAL PRESENTATION:  69 year old male w/ CAF on elliquis recently underwent lap chole on 7/14. Was discharged to home w/drain. Drain removed about a week before presentation to ER. Admitted directly from [facility] on 8/3 w/ septic shock which was likely due to infected hematoma in the GB fossa. PCCM asked to see after new perc drain placed on 8/4, when he remained hypotensive.   STUDIES:  CT abdomen/Pelvis W/ 7/28 - Gas & fluid at cholecystectomy site w/o discrete abscess. Min ascites. Small right pleural eff. CT abdomen/pelvis w/o 8/5 - Gas between liver & stomach. No additional free air. Increased flow void & high density layering c/w increasing hemorrhage as well as increased right pleural eff.  SIGNIFICANT EVENTS: Admitted 8/3 w/ abd pain and hypotension  8/4 hypotensive over-night. Went for [facility] drain. Found what looked like old infected hematoma. PCCM asked to see post-op for shock 8/5 off pressors. WOB worse.  8/6 WOB & wheezing slightly worse. Progressing abdominal pain 8/7 Hgb declining w/ increasing intraperit hemorrhage 8/7 Transfused 2u PRBCs 8/9 work of breathing still significant w/ marked upper airway component  SUBJECTIVE: Pain a little better. Very SOB still & does not tolerate really any activity .   VITAL SIGNS: Temp:  [97.6 F (36.4 C)-99.9 F (37.7 C)] 98.9 F (37.2 C) (08/09 0400) Pulse Rate:  [92-101] 92 (08/09 0800) Resp:  [13-43] 43 (08/09 0800) BP: (108-144)/(50-69) 130/65 mmHg (08/09 0800) SpO2:  [92 %-98 %] 94 % (08/09 0906) 4 liters HEMODYNAMICS:   VENTILATOR SETTINGS:   INTAKE / OUTPUT:  Intake/Output Summary (Last 24 hours) at 01/25/15 0907 Last data filed at 01/25/15 0600  Gross per 24 hour  Intake   1530 ml  Output    425  ml  Net   1105 ml    PHYSICAL EXAMINATION: General:  Appears uncomfortable. Laying recumbent. Mild distress only able to speak one word phrase  Awake & alert. Neuro:  Oriented x3. Moving all 4 extremities equally.  HEENT:  No scleral icterus. Tacky MM. No oral ulcers. Cardiovascular:  Reg rate & rhythm. No edema. Lungs:  Decreased breath sounds bilateral bases w/ mild wheezing; predominately upper airway . Breath sounds are distant.  Moderately increased WOB on [place] Abdomen:  RUQ perc drain in place. Improved/resolved TTP in LUQ & LLQ. Still TTP RUQ. Hypoactive BS. Protuberant. Musculoskeletal:  No joint effusions or deformities. Skin:  Warm & dry. No rash. No abdominal bruising. Trace edema   LABS:  CBC  Recent Labs Lab 01/23/15 0610  01/24/15 0425 01/24/15 1520 01/25/15 0440  WBC 17.2*  --  13.3*  --  12.9*  HGB 5.9*  < > 7.4* 7.8* 7.6*  HCT 17.8*  < > 22.6* 23.1* 23.0*  PLT 275  --  269  --  316  < > = values in this interval not displayed. Coag's  Recent Labs Lab 01/19/15 1613 01/23/15 0754  APTT 35 38*  INR 1.46 1.32   BMET  Recent Labs Lab 01/23/15 0731 01/24/15 0425 01/25/15 0440  NA 136 137 138  K 4.1 4.0 4.1  CL 105 106 106  CO2 BUN 38* 49* 50*  CREATININE 2.75* 3.17* 3.32*  GLUCOSE 230* 191* 192*   Electrolytes  Recent Labs Lab 01/23/15 0731 01/24/15 0425 01/25/15 0440  CALCIUM 7.7* 8.1* 8.0*  MG 1.8 1.9 1.9  PHOS 3.9 4.5 4.4   Sepsis Markers  Recent Labs Lab 01/20/15 1420 01/20/15 1550 01/20/15 1807  LATICACIDVEN 1.6 1.8 0.8   ABG  Recent Labs Lab 01/21/15 0809  PHART 7.378  PCO2ART 38.8  PO2ART 68.6*   Liver Enzymes  Recent Labs Lab 01/23/15 0731 01/24/15 0425 01/25/15 0440  AST 30 38 28  ALT 37 39 35  ALKPHOS 81 93 93  BILITOT 1.5* 1.8* 1.5*  ALBUMIN 2.2* 2.3* 2.1*   Cardiac Enzymes No results for input(s): TROPONINI, PROBNP in the last 168 hours. Glucose  Recent Labs Lab 01/24/15 1249  01/24/15 1635 01/24/15 2028 01/24/15 2327 01/25/15 0426 01/25/15 0746  GLUCAP 172* 178* 178* 191* 178* 162*    Imaging No results found. Elevated Right HD. Right > left patchy infiltrates ASSESSMENT / PLAN:  PULMONARY A: Chronically elevated right HD Evolving patchy pulm infiltrates on right  Acute Hypoxic Respiratory Failure Right pleural effusion - progessing on Abd CT 8/6 >his work of breathing remains significant. There is a significant upper airway component but also suspect atelectasis a major contributor at this point.   P:   Try NIPPV Assess CVP-->possible role for diuresis  Supplemental oxygen for sats > 92% Xopenex neb q6hr Reflux precautions   CARDIOVASCULAR CVL Right IJ 8/4>> A:  Severe sepsis/septic shock - Shock resolved H/o PAF; CHADVASC score 4; currently NSR  Prior NSTEMI   P:  Hold all antihypertensives Monitor in tele Consider diuresis   RENAL A:   ARF - Fxn worsening but UOP stable Hyperkalemia - Resolved  P:   Monitor UOP Monitor electrolytes daily Daily BUN/Creatinine MAP goal > 65 Continue MIVF to D5 1/2 NS at 56ml/hr; until CVP assessment. Might be time to [facility] and diuresis    GASTROINTESTINAL A:   Infected hematoma s/p cholecystectomy - Now s/p CT guided Perc drain  GERD - worse 8/5 w/ associated UAW wheeze Intra-abdominal hemorrhage - Hgb stable now  P:   Diet and drain per surgery and IR services  Protonix IV Reflux precautions Clear liquid diet  HEMATOLOGIC A:   Anemia - Acute drop in Hgb this AM w/ intra-abdominal hemorrhage S/p 2 u PRBC on 8/4 & 2u 8/7> Hgb stable  P:  Change CBC to daily  SCDs Transfuse for Hbg < 7  Holding Heparin [place]  INFECTIOUS A:   Severe sepsis/septic shock - presume source is infected hematoma from recent Cholecystectomy. Shock resolved.  S/p Perc GB fossa site drain 8/4 - minimal serous output  P:   Trending leukocytosis Monitoring for fever  Zosyn 8/3>>> vanc 8/3>>>  Abscess  8/4>>>neg BCX2 8/4>>>  ENDOCRINE A: DM  P:   SSI Accuchecks q4hr Hold oral agents  NEUROLOGIC A:  Abdominal Pain/Peritonitis - Pain improving At risk for delirium   P:   RASS goal: n/a Supportive care Fentanyl IV prn   FAMILY  - Updates: Wife not at bedside at the time of my exam. Friend updated at bedside.  - Inter-disciplinary family meet or Palliative Care meeting due by:  8/11  TODAY'S SUMMARY:  Seems as though bleeding and infectious issues may have stabilized. I think his major concern at this point is his respiratory status and renal injury. Will ck CVP to look for room to diurese, trial NIPPV to assist w/ ATX and upper airway wheeze. His progression has been very slow. Still not [place].   Simonne Martinet ACNP-BC [facility] Pulmonary/Critical Care Pager # [phone] OR # [phone] if no answer  01/25/2015 9:07 AM

## 2015-01-25 NOTE — Progress Notes (Signed)
Pt requesting to come off BIPAP at this time.  Pt placed back on 4 LPM [place] and tolerating well at this time, RT to monitor and assess as needed.

## 2015-01-25 NOTE — Progress Notes (Signed)
Patient ID: Larry [name], male   DOB: 1946-04-23, 69 y.o.   MRN: [id number]  General Surgery - [facility]  HD#: 6  Subjective: Patient in ICU, on BIPAP this AM.  Wife at bedside.  Limited po intake.  No BM overnight.  Complains of RUQ abd pain.  Objective: Vital signs in last 24 hours: Temp:  [97.6 F (36.4 C)-99.9 F (37.7 C)] 99 F (37.2 C) (08/09 0800) Pulse Rate:  [92-101] 92 (08/09 0800) Resp:  [13-43] 43 (08/09 0800) BP: (108-144)/(50-69) 130/65 mmHg (08/09 0800) SpO2:  [92 %-98 %] 94 % (08/09 0906) FiO2 (%):  [40 %] 40 % (08/09 0940) Last BM Date: 01/24/15  Intake/Output from previous day: 08/08 0701 - 08/09 0700 In: 1680 [P.O.:120; I.V.:1200; IV Piggyback:350] Out: 473 [Urine:465; Drains:8] Intake/Output this shift: Total I/O In: 100 [I.V.:100] Out: 0   Physical Exam: HEENT - sclerae clear, mucous membranes moist Neck - soft Abdomen - obese, soft; few BS present; RUQ tenderness stable; drain with minimal serous fluid  Lab Results:   Recent Labs  01/24/15 0425 01/24/15 1520 01/25/15 0440  WBC 13.3*  --  12.9*  HGB 7.4* 7.8* 7.6*  HCT 22.6* 23.1* 23.0*  PLT 269  --  316   BMET  Recent Labs  01/24/15 0425 01/25/15 0440  NA 137 138  K 4.0 4.1  CL 106 106  CO2 22 24  GLUCOSE 191* 192*  BUN 49* 50*  CREATININE 3.17* 3.32*  CALCIUM 8.1* 8.0*   PT/INR  Recent Labs  01/23/15 0754  LABPROT 16.5*  INR 1.32   Comprehensive Metabolic Panel:    Component Value Date/Time   NA 138 01/25/2015 0440   NA 137 01/24/2015 0425   K 4.1 01/25/2015 0440   K 4.0 01/24/2015 0425   CL 106 01/25/2015 0440   CL 106 01/24/2015 0425   CO2 24 01/25/2015 0440   CO2 22 01/24/2015 0425   BUN 50* 01/25/2015 0440   BUN 49* 01/24/2015 0425   CREATININE 3.32* 01/25/2015 0440   CREATININE 3.17* 01/24/2015 0425   GLUCOSE 192* 01/25/2015 0440   GLUCOSE 191* 01/24/2015 0425   CALCIUM 8.0* 01/25/2015 0440   CALCIUM 8.1* 01/24/2015 0425   AST  28 01/25/2015 0440   AST 38 01/24/2015 0425   ALT 35 01/25/2015 0440   ALT 39 01/24/2015 0425   ALKPHOS 93 01/25/2015 0440   ALKPHOS 93 01/24/2015 0425   BILITOT 1.5* 01/25/2015 0440   BILITOT 1.8* 01/24/2015 0425   PROT 6.0* 01/25/2015 0440   PROT 5.8* 01/24/2015 0425   ALBUMIN 2.1* 01/25/2015 0440   ALBUMIN 2.3* 01/24/2015 0425    Studies/Results: No results found.  Anti-infectives: Anti-infectives    Start     Dose/Rate Route Frequency Ordered Stop   01/24/15 2200  vancomycin (VANCOCIN) IVPB 1000 mg/200 mL premix     1,000 mg 200 mL/hr over 60 Minutes Intravenous  Once 01/24/15 1349 01/24/15 2301   01/21/15 2000  vancomycin (VANCOCIN) 1,500 mg in sodium chloride 0.9 % 500 mL IVPB  Status:  Discontinued     1,500 mg 250 mL/hr over 120 Minutes Intravenous Every 24 hours 01/21/15 1048 01/23/15 1726   01/20/15 2000  vancomycin (VANCOCIN) 1,250 mg in sodium chloride 0.9 % 250 mL IVPB  Status:  Discontinued     1,250 mg 166.7 mL/hr over 90 Minutes Intravenous Every 24 hours 01/19/15 1945 01/20/15 0843   01/20/15 2000  vancomycin (VANCOCIN) 1,250 mg in sodium chloride 0.9 %  250 mL IVPB  Status:  Discontinued     1,250 mg 166.7 mL/hr over 90 Minutes Intravenous Every 24 hours 01/20/15 1519 01/21/15 1048   01/20/15 0000  piperacillin-tazobactam (ZOSYN) IVPB 3.375 g     3.375 g 12.5 mL/hr over 240 Minutes Intravenous Every 8 hours 01/19/15 1946     01/19/15 1715  vancomycin (VANCOCIN) 2,500 mg in sodium chloride 0.9 % 500 mL IVPB     2,500 mg 250 mL/hr over 120 Minutes Intravenous  Once 01/19/15 1707 01/19/15 2159   01/19/15 1715  piperacillin-tazobactam (ZOSYN) IVPB 3.375 g     3.375 g 100 mL/hr over 30 Minutes Intravenous  Once 01/19/15 1707 01/19/15 1900      Assessment & Plans: Sepsis syndrome Intraabdominal abscess (subhepatic) with peritonitis - perc drain placement IV Zosyn/Vancomycin Improving GI function - allow clear liquids and advance as  tolerated Acute blood loss anemia Hgb stable S/p laparoscopic cholecystectomy 12/30/14 Acute on chronic renal insufficiency Respiratory failure / hypoxia  Per CCM  Will follow closely.  Velora Heckler, MD, [facility] Office: [phone]   Jaesean Litzau Judie Petit 01/25/2015

## 2015-01-25 NOTE — Progress Notes (Signed)
Pt currently off BIPAP and on 4 LPM [place] and resting comfortably at this time, Pt in no respiratory distress at this time.  RT to monitor and assess as needed.

## 2015-01-26 LAB — COMPREHENSIVE METABOLIC PANEL
ALBUMIN: 2.1 g/dL — AB (ref 3.5–5.0)
ALT: 26 U/L (ref 17–63)
AST: 20 U/L (ref 15–41)
Alkaline Phosphatase: 84 U/L (ref 38–126)
Anion gap: 8 (ref 5–15)
BUN: 52 mg/dL — ABNORMAL HIGH (ref 6–20)
CALCIUM: 8.2 mg/dL — AB (ref 8.9–10.3)
CO2: 25 mmol/L (ref 22–32)
CREATININE: 3.57 mg/dL — AB (ref 0.61–1.24)
Chloride: 107 mmol/L (ref 101–111)
GFR calc Af Amer: 19 mL/min — ABNORMAL LOW (ref >60)
GFR calc non Af Amer: 16 mL/min — ABNORMAL LOW (ref >60)
Glucose, Bld: 133 mg/dL — ABNORMAL HIGH (ref 65–99)
Potassium: 3.9 mmol/L (ref 3.5–5.1)
Sodium: 140 mmol/L (ref 135–145)
Total Bilirubin: 1.8 mg/dL — ABNORMAL HIGH (ref 0.3–1.2)
Total Protein: 5.8 g/dL — ABNORMAL LOW (ref 6.5–8.1)

## 2015-01-26 LAB — GLUCOSE, CAPILLARY
GLUCOSE-CAPILLARY: 120 mg/dL — AB (ref 65–99)
GLUCOSE-CAPILLARY: 200 mg/dL — AB (ref 65–99)
Glucose-Capillary: 137 mg/dL — ABNORMAL HIGH (ref 65–99)
Glucose-Capillary: 145 mg/dL — ABNORMAL HIGH (ref 65–99)
Glucose-Capillary: 258 mg/dL — ABNORMAL HIGH (ref 65–99)
Glucose-Capillary: 182 mg/dL — ABNORMAL HIGH (ref 65–99)

## 2015-01-26 LAB — CBC WITH DIFFERENTIAL/PLATELET
Basophils Absolute: 0 10*3/uL (ref 0.0–0.1)
Basophils Relative: 0 % (ref 0–1)
EOS PCT: 2 % (ref 0–5)
Eosinophils Absolute: 0.2 10*3/uL (ref 0.0–0.7)
HCT: 23.8 % — ABNORMAL LOW (ref 39.0–52.0)
Hemoglobin: 7.6 g/dL — ABNORMAL LOW (ref 13.0–17.0)
LYMPHS ABS: 0.7 10*3/uL (ref 0.7–4.0)
Lymphocytes Relative: 6 % — ABNORMAL LOW (ref 12–46)
MCH: 28.6 pg (ref 26.0–34.0)
MCHC: 31.9 g/dL (ref 30.0–36.0)
MCV: 89.5 fL (ref 78.0–100.0)
MONOS PCT: 10 % (ref 3–12)
Monocytes Absolute: 1.3 10*3/uL — ABNORMAL HIGH (ref 0.1–1.0)
NEUTROS PCT: 82 % — AB (ref 43–77)
Neutro Abs: 10.8 10*3/uL — ABNORMAL HIGH (ref 1.7–7.7)
Platelets: 317 10*3/uL (ref 150–400)
RBC: 2.66 MIL/uL — AB (ref 4.22–5.81)
RDW: 16.5 % — ABNORMAL HIGH (ref 11.5–15.5)
WBC: 13.1 10*3/uL — AB (ref 4.0–10.5)

## 2015-01-26 LAB — PHOSPHORUS: PHOSPHORUS: 4.4 mg/dL (ref 2.5–4.6)

## 2015-01-26 LAB — ANAEROBIC CULTURE: Special Requests: NORMAL

## 2015-01-26 LAB — MAGNESIUM: MAGNESIUM: 1.9 mg/dL (ref 1.7–2.4)

## 2015-01-26 LAB — HEMOGLOBIN AND HEMATOCRIT, BLOOD
HCT: 24 % — ABNORMAL LOW (ref 39.0–52.0)
Hemoglobin: 7.9 g/dL — ABNORMAL LOW (ref 13.0–17.0)

## 2015-01-26 MED ORDER — PANTOPRAZOLE SODIUM 40 MG PO TBEC
40.0000 mg | DELAYED_RELEASE_TABLET | Freq: Two times a day (BID) | ORAL | Status: DC
Start: 2015-01-26 — End: 2015-01-30
  Administered 2015-01-26 – 2015-01-30 (×8): 40 mg via ORAL
  Filled 2015-01-26 (×8): qty 1

## 2015-01-26 MED ORDER — CHLORHEXIDINE GLUCONATE 0.12 % MT SOLN
OROMUCOSAL | Status: AC
  Filled 2015-01-26: qty 15

## 2015-01-26 NOTE — Progress Notes (Signed)
Patient ID: Larry [name], male   DOB: [birth year], 69 y.o.   MRN: [id number]  General Surgery [facility]  POD#: 8  Subjective: Patient awake and alert, wants to use bedpan.  Family at bedside.  Pt states abd pain is "not too bad" this morning.  Limited po intake - some liquids.  Objective: Vital signs in last 24 hours: Temp:  [96.5 F (35.8 C)-99.3 F (37.4 C)] 99.3 F (37.4 C) (08/10 0400) Pulse Rate:  [83-100] 95 (08/10 0400) Resp:  [15-48] 20 (08/10 0400) BP: (107-166)/(50-73) 130/54 mmHg (08/10 0400) SpO2:  [89 %-100 %] 95 % (08/10 0400) FiO2 (%):  [40 %] 40 % (08/09 2208) Last BM Date: 01/24/15  Intake/Output from previous day: 08/09 0701 - 08/10 0700 In: 622.3 [P.O.:100; I.V.:387.3; IV Piggyback:100] Out: 2245 [Urine:2240; Drains:5] Intake/Output this shift:    Physical Exam: HEENT - sclerae clear, mucous membranes moist Neck - soft, triple lumen on right Chest - shallow bilaterally Cor - irreg, rate controlled Abdomen - somewhat softer, mild tender RUQ; BS present  Lab Results:   Recent Labs  01/25/15 0440 01/25/15 1500 01/26/15 0545  WBC 12.9*  --  13.1*  HGB 7.6* 7.7* 7.6*  HCT 23.0* 23.5* 23.8*  PLT 316  --  317   BMET  Recent Labs  01/25/15 0440 01/26/15 0545  NA 138 140  K 4.1 3.9  CL 106 107  CO2 24 25  GLUCOSE 192* 133*  BUN 50* 52*  CREATININE 3.32* 3.57*  CALCIUM 8.0* 8.2*   PT/INR  Recent Labs  01/23/15 0754  LABPROT 16.5*  INR 1.32   Comprehensive Metabolic Panel:    Component Value Date/Time   NA 140 01/26/2015 0545   NA 138 01/25/2015 0440   K 3.9 01/26/2015 0545   K 4.1 01/25/2015 0440   CL 107 01/26/2015 0545   CL 106 01/25/2015 0440   CO2 25 01/26/2015 0545   CO2 24 01/25/2015 0440   BUN 52* 01/26/2015 0545   BUN 50* 01/25/2015 0440   CREATININE 3.57* 01/26/2015 0545   CREATININE 3.32* 01/25/2015 0440   GLUCOSE 133* 01/26/2015 0545   GLUCOSE 192* 01/25/2015 0440   CALCIUM 8.2*  01/26/2015 0545   CALCIUM 8.0* 01/25/2015 0440   AST 20 01/26/2015 0545   AST 28 01/25/2015 0440   ALT 26 01/26/2015 0545   ALT 35 01/25/2015 0440   ALKPHOS 84 01/26/2015 0545   ALKPHOS 93 01/25/2015 0440   BILITOT 1.8* 01/26/2015 0545   BILITOT 1.5* 01/25/2015 0440   PROT 5.8* 01/26/2015 0545   PROT 6.0* 01/25/2015 0440   ALBUMIN 2.1* 01/26/2015 0545   ALBUMIN 2.1* 01/25/2015 0440    Studies/Results: No results found.  Anti-infectives: Anti-infectives    Start     Dose/Rate Route Frequency Ordered Stop   01/24/15 2200  vancomycin (VANCOCIN) IVPB 1000 mg/200 mL premix     1,000 mg 200 mL/hr over 60 Minutes Intravenous  Once 01/24/15 1349 01/24/15 2301   01/21/15 2000  vancomycin (VANCOCIN) 1,500 mg in sodium chloride 0.9 % 500 mL IVPB  Status:  Discontinued     1,500 mg 250 mL/hr over 120 Minutes Intravenous Every 24 hours 01/21/15 1048 01/23/15 1726   01/20/15 2000  vancomycin (VANCOCIN) 1,250 mg in sodium chloride 0.9 % 250 mL IVPB  Status:  Discontinued     1,250 mg 166.7 mL/hr over 90 Minutes Intravenous Every 24 hours 01/19/15 1945 01/20/15 0843   01/20/15 2000  vancomycin (VANCOCIN) 1,250  mg in sodium chloride 0.9 % 250 mL IVPB  Status:  Discontinued     1,250 mg 166.7 mL/hr over 90 Minutes Intravenous Every 24 hours 01/20/15 1519 01/21/15 1048   01/20/15 0000  piperacillin-tazobactam (ZOSYN) IVPB 3.375 g     3.375 g 12.5 mL/hr over 240 Minutes Intravenous Every 8 hours 01/19/15 1946     01/19/15 1715  vancomycin (VANCOCIN) 2,500 mg in sodium chloride 0.9 % 500 mL IVPB     2,500 mg 250 mL/hr over 120 Minutes Intravenous  Once 01/19/15 1707 01/19/15 2159   01/19/15 1715  piperacillin-tazobactam (ZOSYN) IVPB 3.375 g     3.375 g 100 mL/hr over 30 Minutes Intravenous  Once 01/19/15 1707 01/19/15 1900      Assessment & Plans: 1. Status post lap chole with post op abscess  Perc drain in place with limited drain output  Post procedure hemorrhage - stable  IV Zosyn 2.  Acute blood loss anemia  Hgb stable after transfusion 3. Acute on chronic renal insufficiency  Creatinine rising to 3.5  UOP difficult to assess - appears adequate 4. Respiratory failure / dyspnea  BIPAP per CCM - poorly tolerated 5. Atrial fibrillation  Off anticoagulation due to bleeding   Velora Heckler, MD, [facility] Office: [phone]   Larry [name] 01/26/2015

## 2015-01-26 NOTE — Progress Notes (Signed)
PULMONARY / CRITICAL CARE MEDICINE   Name: Larry [name] MRN: [id number] DOB: 1945-08-15    ADMISSION DATE:  01/19/2015 CONSULTATION DATE:  8/4  REFERRING MD :  [name]  CHIEF COMPLAINT:  Septic shock   INITIAL PRESENTATION:  69 year old male w/ CAF on elliquis recently underwent lap chole on 7/14. Was discharged to home w/drain. Drain removed about a week before presentation to ER. Admitted directly from [facility] on 8/3 w/ septic shock which was likely due to infected hematoma in the GB fossa. PCCM asked to see after new perc drain placed on 8/4, when he remained hypotensive.   STUDIES:  CT abdomen/Pelvis W/ 7/28 - Gas & fluid at cholecystectomy site w/o discrete abscess. Min ascites. Small right pleural eff. CT abdomen/pelvis w/o 8/5 - Gas between liver & stomach. No additional free air. Increased flow void & high density layering c/w increasing hemorrhage as well as increased right pleural eff.  SIGNIFICANT EVENTS: Admitted 8/3 w/ abd pain and hypotension  8/4 hypotensive over-night. Went for [facility] drain. Found what looked like old infected hematoma. PCCM asked to see post-op for shock 8/5 off pressors. WOB worse.  8/6 WOB & wheezing slightly worse. Progressing abdominal pain 8/7 Hgb declining w/ increasing intraperit hemorrhage 8/7 Transfused 2u PRBCs 8/9 work of breathing still significant w/ marked upper airway component  8/10 breathing better. 1.6 liters negative. Slight bump in scr. Vanc stopped. Getting OOB for first time  SUBJECTIVE: Pain a little better. Less SOB  VITAL SIGNS: Temp:  [96.5 F (35.8 C)-99.3 F (37.4 C)] 98.1 F (36.7 C) (08/10 0800) Pulse Rate:  [83-101] 101 (08/10 0815) Resp:  [15-48] 28 (08/10 0815) BP: (98-166)/(49-77) 159/77 mmHg (08/10 0815) SpO2:  [89 %-100 %] 95 % (08/10 0815) FiO2 (%):  [40 %] 40 % (08/09 2208) 4 liters HEMODYNAMICS: CVP:  [11 mmHg-19 mmHg] 12 mmHg VENTILATOR SETTINGS: Vent Mode:  [-] BIPAP FiO2 (%):  [40 %]  40 % Set Rate:  [14 bmp] 14 bmp PEEP:  [6 cmH20] 6 cmH20 INTAKE / OUTPUT:  Intake/Output Summary (Last 24 hours) at 01/26/15 0944 Last data filed at 01/26/15 0600  Gross per 24 hour  Intake 472.33 ml  Output   2145 ml  Net -1672.67 ml    PHYSICAL EXAMINATION: General:  Absolutely no distress. Feels better. No pain  Neuro:  Oriented x3. Moving all 4 extremities equally. Anxiety improved  HEENT:  No scleral icterus. Tacky MM. No oral ulcers. Cardiovascular:  Reg rate & rhythm. No edema. Lungs:  Decreased breath sounds bilateral bases w/ mild wheezing; predominately upper airway, this has improved.  Abdomen:  RUQ perc drain in place. pain Improved/resolved  Hypoactive BS. Protuberant. Musculoskeletal:  No joint effusions or deformities. Skin:  Warm & dry. No rash. No abdominal bruising. Trace edema   LABS:  CBC  Recent Labs Lab 01/24/15 0425  01/25/15 0440 01/25/15 1500 01/26/15 0545  WBC 13.3*  --  12.9*  --  13.1*  HGB 7.4*  < > 7.6* 7.7* 7.6*  HCT 22.6*  < > 23.0* 23.5* 23.8*  PLT 269  --  316  --  317  < > = values in this interval not displayed. Coag's  Recent Labs Lab 01/19/15 1613 01/23/15 0754  APTT 35 38*  INR 1.46 1.32   BMET  Recent Labs Lab 01/24/15 0425 01/25/15 0440 01/26/15 0545  NA 137 138 140  K 4.0 4.1 3.9  CL 106 106 107  CO2 22 24  25  BUN 49* 50* 52*  CREATININE 3.17* 3.32* 3.57*  GLUCOSE 191* 192* 133*   Electrolytes  Recent Labs Lab 01/24/15 0425 01/25/15 0440 01/26/15 0545  CALCIUM 8.1* 8.0* 8.2*  MG 1.9 1.9 1.9  PHOS 4.5 4.4 4.4   Sepsis Markers  Recent Labs Lab 01/20/15 1420 01/20/15 1550 01/20/15 1807  LATICACIDVEN 1.6 1.8 0.8   ABG  Recent Labs Lab 01/21/15 0809  PHART 7.378  PCO2ART 38.8  PO2ART 68.6*   Liver Enzymes  Recent Labs Lab 01/24/15 0425 01/25/15 0440 01/26/15 0545  AST 38 28 20  ALT 39 35 26  ALKPHOS 93 93 84  BILITOT 1.8* 1.5* 1.8*  ALBUMIN 2.3* 2.1* 2.1*   Cardiac Enzymes No  results for input(s): TROPONINI, PROBNP in the last 168 hours. Glucose  Recent Labs Lab 01/25/15 0746 01/25/15 1152 01/25/15 1647 01/25/15 2001 01/26/15 01/26/15 0404  GLUCAP 162* 162* 152* 144* 137* 120*    Imaging No results found. Elevated Right HD. Right > left patchy infiltrates ASSESSMENT / PLAN:  PULMONARY A: Chronically elevated right HD Evolving patchy pulm infiltrates on right  Acute Hypoxic Respiratory Failure Right pleural effusion - progessing on Abd CT 8/6 >his work of breathing remains significant. There is a significant upper airway component but also suspect atelectasis a major contributor at this point. Looks much better 8/10  P:   Wean FIO2  Supplemental oxygen for sats > 92% Xopenex neb q6hr Reflux precautions  Get OOB  CARDIOVASCULAR CVL Right IJ 8/4>> A:  Severe sepsis/septic shock - Shock resolved H/o PAF; CHADVASC score 4; currently NSR  Prior NSTEMI   P:  Hold all antihypertensives Monitor in tele Holding further lasix for now  Will need to reassess anticoagulation soon   RENAL A:   ARF - Fxn worsening slightly worse but good UOP after lasix.  Hyperkalemia - Resolved  P:   Monitor UOP Monitor electrolytes daily Dc vanc Hold further diuretics Daily BUN/Creatinine   GASTROINTESTINAL A:   Infected hematoma s/p cholecystectomy - Now s/p CT guided Perc drain  GERD - worse 8/5 w/ associated [employer] wheeze Intra-abdominal hemorrhage - Hgb stable now  P:   Diet and drain per surgery and IR services  Protonix  Reflux precautions Clear liquid diet  HEMATOLOGIC A:   Anemia - Acute drop in Hgb this AM w/ intra-abdominal hemorrhage S/p 2 u PRBC on 8/4 & 2u 8/7> Hgb stable  P:  Change CBC to daily  SCDs Transfuse for Hbg < 7  Holding Heparin [place]  INFECTIOUS A:   Severe sepsis/septic shock - presume source is infected hematoma from recent Cholecystectomy. Shock resolved.  S/p Perc GB fossa site drain 8/4 - minimal serous  output  P:   Trending leukocytosis Monitoring for fever Stopping vanc today   Zosyn 8/3>>> vanc 8/3>>>dc 8/10  Abscess 8/4>>>neg BCX2 8/4>>>neg   ENDOCRINE A: DM  P:   SSI Accuchecks q4hr Hold oral agents  NEUROLOGIC A:  Abdominal Pain/Peritonitis - Pain improving At risk for delirium   P:   RASS goal: n/a Supportive care Fentanyl IV prn   FAMILY  - Updates: Wife not at bedside at the time of my exam. Friend updated at bedside.  - Inter-disciplinary family meet or Palliative Care meeting due by:  8/11  TODAY'S SUMMARY:  He looks like he may have turned the corner. He is hemodynamically stable. His breathing pattern has improved. The only issue that still raises concern is that his creatinine is continuing to rise.  This may have been in part due to diuresis. We will d/c vanc. Push mobilization and continue care as outlined. At some point we will need to re-address anticoagulation again, given his h/o AF   Simonne Martinet ACNP-BC [facility] Pulmonary/Critical Care Pager # [phone] OR # [phone] if no answer  01/26/2015 9:44 AM

## 2015-01-26 NOTE — Progress Notes (Signed)
Pt currently resting comfortably on 4 LPM [place].  Pt in no distress at this time, RT will continue to hold BIPAP per Pt's wish.  RT to monitor and assess as needed.

## 2015-01-26 NOTE — Progress Notes (Signed)
Date:  January 26, 2015 U.R. performed for needs and level of care. Will continue to follow for Case Management needs.  Rhonda Davis, RN, BSN, CCM   [phone] 

## 2015-01-27 ENCOUNTER — Inpatient Hospital Stay (HOSPITAL_COMMUNITY): Payer: Medicare Other

## 2015-01-27 LAB — GLUCOSE, CAPILLARY
GLUCOSE-CAPILLARY: 141 mg/dL — AB (ref 65–99)
GLUCOSE-CAPILLARY: 187 mg/dL — AB (ref 65–99)
GLUCOSE-CAPILLARY: 190 mg/dL — AB (ref 65–99)
Glucose-Capillary: 165 mg/dL — ABNORMAL HIGH (ref 65–99)
Glucose-Capillary: 144 mg/dL — ABNORMAL HIGH (ref 65–99)
Glucose-Capillary: 199 mg/dL — ABNORMAL HIGH (ref 65–99)
Glucose-Capillary: 130 mg/dL — ABNORMAL HIGH (ref 65–99)

## 2015-01-27 LAB — COMPREHENSIVE METABOLIC PANEL
ALT: 22 U/L (ref 17–63)
ANION GAP: 7 (ref 5–15)
AST: 17 U/L (ref 15–41)
Albumin: 2.3 g/dL — ABNORMAL LOW (ref 3.5–5.0)
Alkaline Phosphatase: 77 U/L (ref 38–126)
BUN: 50 mg/dL — AB (ref 6–20)
CALCIUM: 8.3 mg/dL — AB (ref 8.9–10.3)
CO2: 26 mmol/L (ref 22–32)
CREATININE: 3.51 mg/dL — AB (ref 0.61–1.24)
Chloride: 105 mmol/L (ref 101–111)
GFR, EST AFRICAN AMERICAN: 19 mL/min — AB (ref >60)
GFR, EST NON AFRICAN AMERICAN: 16 mL/min — AB (ref >60)
GLUCOSE: 168 mg/dL — AB (ref 65–99)
Potassium: 3.6 mmol/L (ref 3.5–5.1)
Sodium: 138 mmol/L (ref 135–145)
TOTAL PROTEIN: 6.1 g/dL — AB (ref 6.5–8.1)
Total Bilirubin: 1.7 mg/dL — ABNORMAL HIGH (ref 0.3–1.2)

## 2015-01-27 LAB — CBC WITH DIFFERENTIAL/PLATELET
Basophils Absolute: 0 10*3/uL (ref 0.0–0.1)
Basophils Relative: 0 % (ref 0–1)
Eosinophils Absolute: 0.1 10*3/uL (ref 0.0–0.7)
Eosinophils Relative: 1 % (ref 0–5)
HCT: 24.1 % — ABNORMAL LOW (ref 39.0–52.0)
Hemoglobin: 8 g/dL — ABNORMAL LOW (ref 13.0–17.0)
LYMPHS ABS: 0.7 10*3/uL (ref 0.7–4.0)
LYMPHS PCT: 5 % — AB (ref 12–46)
MCH: 30 pg (ref 26.0–34.0)
MCHC: 33.2 g/dL (ref 30.0–36.0)
MCV: 90.3 fL (ref 78.0–100.0)
Monocytes Absolute: 1.5 10*3/uL — ABNORMAL HIGH (ref 0.1–1.0)
Monocytes Relative: 11 % (ref 3–12)
NEUTROS PCT: 83 % — AB (ref 43–77)
Neutro Abs: 10.4 10*3/uL — ABNORMAL HIGH (ref 1.7–7.7)
PLATELETS: 327 10*3/uL (ref 150–400)
RBC: 2.67 MIL/uL — AB (ref 4.22–5.81)
RDW: 16.5 % — ABNORMAL HIGH (ref 11.5–15.5)
WBC: 12.7 10*3/uL — AB (ref 4.0–10.5)

## 2015-01-27 LAB — MAGNESIUM: Magnesium: 2 mg/dL (ref 1.7–2.4)

## 2015-01-27 LAB — HEMOGLOBIN AND HEMATOCRIT, BLOOD
HCT: 24.8 % — ABNORMAL LOW (ref 39.0–52.0)
Hemoglobin: 7.9 g/dL — ABNORMAL LOW (ref 13.0–17.0)

## 2015-01-27 LAB — PHOSPHORUS: Phosphorus: 3.6 mg/dL (ref 2.5–4.6)

## 2015-01-27 MED ORDER — HEPARIN SODIUM (PORCINE) 5000 UNIT/ML IJ SOLN
5000.0000 [IU] | Freq: Three times a day (TID) | INTRAMUSCULAR | Status: DC
  Administered 2015-01-27 – 2015-01-30 (×9): 5000 [IU] via SUBCUTANEOUS
  Filled 2015-01-27 (×8): qty 1

## 2015-01-27 MED ORDER — LEVALBUTEROL HCL 0.63 MG/3ML IN NEBU
0.6300 mg | INHALATION_SOLUTION | Freq: Three times a day (TID) | RESPIRATORY_TRACT | Status: DC
  Administered 2015-01-28 – 2015-01-29 (×3): 0.63 mg via RESPIRATORY_TRACT
  Filled 2015-01-27 (×3): qty 3

## 2015-01-27 NOTE — Progress Notes (Signed)
Patient ID: Larry [name], male   DOB: 04/15/1946, 69 y.o.   MRN: [id number]  General Surgery - [facility]  HD#: 9  Subjective: Patient out of bed in chair!  Less pain noted.  Eating pancakes! Family at bedside.  Objective: Vital signs in last 24 hours: Temp:  [98 F (36.7 C)-99.4 F (37.4 C)] 98.2 F (36.8 C) (08/11 0324) Pulse Rate:  [88-102] 88 (08/11 0855) Resp:  [21-45] 21 (08/11 0855) BP: (135-166)/(52-68) 154/59 mmHg (08/11 0400) SpO2:  [78 %-100 %] 78 % (08/11 0855) Weight:  [127.3 kg (280 lb 10.3 oz)] 127.3 kg (280 lb 10.3 oz) (08/11 0400) Last BM Date: 01/26/15  Intake/Output from previous day: 08/10 0701 - 08/11 0700 In: 950 [P.O.:730; I.V.:110; IV Piggyback:100] Out: 1080 [Urine:1070; Drains:10] Intake/Output this shift:    Physical Exam: HEENT - sclerae clear, mucous membranes moist Neck - right IJ line in place Chest - clear bilaterally Cor - irreg, rate controlled Abdomen - soft, obese; mild tender RUQ; serosanguinous in drain bulb  Lab Results:   Recent Labs  01/26/15 0545 01/26/15 1530 01/27/15 0345  WBC 13.1*  --  12.7*  HGB 7.6* 7.9* 8.0*  HCT 23.8* 24.0* 24.1*  PLT 317  --  327   BMET  Recent Labs  01/26/15 0545 01/27/15 0345  NA 140 138  K 3.9 3.6  CL 107 105  CO2 25 26  GLUCOSE 133* 168*  BUN 52* 50*  CREATININE 3.57* 3.51*  CALCIUM 8.2* 8.3*   PT/INR No results for input(s): LABPROT, INR in the last 72 hours. Comprehensive Metabolic Panel:    Component Value Date/Time   NA 138 01/27/2015 0345   NA 140 01/26/2015 0545   K 3.6 01/27/2015 0345   K 3.9 01/26/2015 0545   CL 105 01/27/2015 0345   CL 107 01/26/2015 0545   CO2 26 01/27/2015 0345   CO2 25 01/26/2015 0545   BUN 50* 01/27/2015 0345   BUN 52* 01/26/2015 0545   CREATININE 3.51* 01/27/2015 0345   CREATININE 3.57* 01/26/2015 0545   GLUCOSE 168* 01/27/2015 0345   GLUCOSE 133* 01/26/2015 0545   CALCIUM 8.3* 01/27/2015 0345   CALCIUM 8.2*  01/26/2015 0545   AST 17 01/27/2015 0345   AST 20 01/26/2015 0545   ALT 22 01/27/2015 0345   ALT 26 01/26/2015 0545   ALKPHOS 77 01/27/2015 0345   ALKPHOS 84 01/26/2015 0545   BILITOT 1.7* 01/27/2015 0345   BILITOT 1.8* 01/26/2015 0545   PROT 6.1* 01/27/2015 0345   PROT 5.8* 01/26/2015 0545   ALBUMIN 2.3* 01/27/2015 0345   ALBUMIN 2.1* 01/26/2015 0545    Studies/Results: No results found.  Anti-infectives: Anti-infectives    Start     Dose/Rate Route Frequency Ordered Stop   01/24/15 2200  vancomycin (VANCOCIN) IVPB 1000 mg/200 mL premix     1,000 mg 200 mL/hr over 60 Minutes Intravenous  Once 01/24/15 1349 01/24/15 2301   01/21/15 2000  vancomycin (VANCOCIN) 1,500 mg in sodium chloride 0.9 % 500 mL IVPB  Status:  Discontinued     1,500 mg 250 mL/hr over 120 Minutes Intravenous Every 24 hours 01/21/15 1048 01/23/15 1726   01/20/15 2000  vancomycin (VANCOCIN) 1,250 mg in sodium chloride 0.9 % 250 mL IVPB  Status:  Discontinued     1,250 mg 166.7 mL/hr over 90 Minutes Intravenous Every 24 hours 01/19/15 1945 01/20/15 0843   01/20/15 2000  vancomycin (VANCOCIN) 1,250 mg in sodium chloride 0.9 % 250 mL  IVPB  Status:  Discontinued     1,250 mg 166.7 mL/hr over 90 Minutes Intravenous Every 24 hours 01/20/15 1519 01/21/15 1048   01/20/15 0000  piperacillin-tazobactam (ZOSYN) IVPB 3.375 g     3.375 g 12.5 mL/hr over 240 Minutes Intravenous Every 8 hours 01/19/15 1946     01/19/15 1715  vancomycin (VANCOCIN) 2,500 mg in sodium chloride 0.9 % 500 mL IVPB     2,500 mg 250 mL/hr over 120 Minutes Intravenous  Once 01/19/15 1707 01/19/15 2159   01/19/15 1715  piperacillin-tazobactam (ZOSYN) IVPB 3.375 g     3.375 g 100 mL/hr over 30 Minutes Intravenous  Once 01/19/15 1707 01/19/15 1900      Assessment & Plans: 1. Status post lap chole with post op abscess Perc drain in place with limited drain output Post procedure hemorrhage - stable IV  Zosyn 2. Acute blood loss anemia Hgb stable after transfusion 3. Acute on chronic renal insufficiency Creatinine stabilizing at 3.5 4. Respiratory failure / dyspnea BIPAP per CCM - poorly tolerated 5. Atrial fibrillation Consider restarting anticoagulation - per medical service - OK from surgical standpoint  Larry Heckler, MD, [facility] Office: [phone]   Larry [name] 01/27/2015

## 2015-01-27 NOTE — Care Management Important Message (Signed)
Important Message  Patient Details  Name: Larry [name] MRN: [id number] Date of Birth: 08-28-1945   Medicare Important Message Given:  Yes-fourth notification given    Haskell Flirt 01/27/2015, 12:39 PMImportant Message  Patient Details  Name: Larry [name] MRN: [id number] Date of Birth: Oct 16, 1945   Medicare Important Message Given:  Yes-fourth notification given    Haskell Flirt 01/27/2015, 12:38 PM

## 2015-01-27 NOTE — Evaluation (Signed)
Physical Therapy Evaluation Patient Details Name: Larry [name] MRN: [id number] DOB: [birth year] Today's Date: 01/27/2015   History of Present Illness  69 year old male w/ CAF on elliquis recently underwent lap chole on 7/14. Was discharged to home w/drain. Drain removed about a week before presentation to ER. Admitted directly  from [facility] on 8/3 w/ septic shock which was likely due to infected hematoma in the GB fossa.  new perc drain placed on 8/4, when he remained hypotensive  Clinical Impression  Patient  C/o 8/10 pain  With foley catheter, RN aware. Patient requires 2 persons to assist with mobility. Patient will benefit from PT to address problems listed in note below. Depends on progress and caregiver  As to disposition. Patient in too much pain to discuss details of PLOF.    Follow Up Recommendations SNF;Supervision/Assistance - 24 hour;Home health PT (depends on progress and caregiver availability, wife not in room.)    Equipment Recommendations  Rolling walker with 5" wheels    Recommendations for Other Services       Precautions / Restrictions Precautions Precaution Comments: on O2.      Mobility  Bed Mobility               General bed mobility comments: OOB by nursing w/ 2 persons  Transfers Overall transfer level: Needs assistance Equipment used: Rolling walker (2 wheeled);2 person hand held assist Transfers: Sit to/from [employer] Sit to Stand: Mod assist Stand pivot transfers: Mod assist       General transfer comment: 2 person arm hold bed to [facility],  stood w/ 2 persons Mod assist from [facility] then recliner brought up, cues for safety and hand placement, extra time due to c/o pain scrtal area.  Ambulation/Gait             General Gait Details: NT  Stairs            Wheelchair Mobility    Modified Rankin (Stroke Patients Only)       Balance Overall balance assessment: Needs  assistance Sitting-balance support: Feet supported;Bilateral upper extremity supported                                         Pertinent Vitals/Pain Pain Assessment: Faces Pain Score: 8  Faces Pain Scale: Hurts worst Pain Location: catheter >abdomen Pain Descriptors / Indicators: Discomfort;Tightness Pain Intervention(s): Repositioned;Patient requesting pain meds-RN notified;RN gave pain meds during session;Limited activity within patient's tolerance;Monitored during session    Home Living Family/patient expects to be discharged to:: Private residence Living Arrangements: Spouse/significant other Available Help at Discharge: Family Type of Home: House Home Access: Level entry     Home Layout: One level   Additional Comments: patient was  not in a situation to provide info due to increased pain.    Prior Function           Comments: uncertain, was at home after recent DC from hospital, PLOF unknown     Hand Dominance        Extremity/Trunk Assessment   Upper Extremity Assessment: Generalized weakness           Lower Extremity Assessment: Generalized weakness         Communication   Communication: No difficulties  Cognition Arousal/Alertness: Awake/alert Behavior During Therapy: Anxious Overall Cognitive Status: Within Functional Limits for tasks assessed  General Comments      Exercises        Assessment/Plan    PT Assessment Patient needs continued PT services  PT Diagnosis Difficulty walking;Generalized weakness;Acute pain   PT Problem List Decreased strength;Decreased activity tolerance;Decreased mobility;Obesity;Decreased knowledge of precautions;Decreased safety awareness;Decreased knowledge of use of DME;Pain;Decreased skin integrity  PT Treatment Interventions DME instruction;Gait training;Functional mobility training;Therapeutic activities;Patient/family education   PT Goals (Current goals  can be found in the Care Plan section) Acute Rehab PT Goals Patient Stated Goal: to get the catheter out, PT Goal Formulation: With patient Time For Goal Achievement: 02/10/15 Potential to Achieve Goals: Good    Frequency Min 3X/week   Barriers to discharge        Co-evaluation               End of Session   Activity Tolerance: Patient limited by pain Patient left: in chair;with call bell/phone within reach;with family/visitor present Nurse Communication: Mobility status         Time: 1610-9604 PT Time Calculation (min) (ACUTE ONLY): 27 min   Charges:   PT Evaluation $Initial PT Evaluation Tier I: 1 Procedure PT Treatments $Therapeutic Activity: 8-22 mins   PT G Codes:        Rada Hay 01/27/2015, 10:08 AM Blanchard Kelch PT [phone]
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
## 2015-01-27 NOTE — Progress Notes (Signed)
PULMONARY / CRITICAL CARE MEDICINE   Name: Larry [name] MRN: [id number] DOB: [birth year]    ADMISSION DATE:  01/19/2015 CONSULTATION DATE:  8/4  REFERRING MD :  [name]  CHIEF COMPLAINT:  Septic shock   INITIAL PRESENTATION:  69 year old male w/ CAF on elliquis recently underwent lap chole on 7/14. Was discharged to home w/drain. Drain removed about a week before presentation to ER. Admitted directly from [facility] on 8/3 w/ septic shock which was likely due to infected hematoma in the GB fossa. PCCM asked to see after new perc drain placed on 8/4, when he remained hypotensive.   STUDIES:  CT abdomen/Pelvis W/ 7/28 - Gas & fluid at cholecystectomy site w/o discrete abscess. Min ascites. Small right pleural eff. CT abdomen/pelvis w/o 8/5 - Gas between liver & stomach. No additional free air. Increased flow void & high density layering c/w increasing hemorrhage as well as increased right pleural eff.  SIGNIFICANT EVENTS: Admitted 8/3 w/ abd pain and hypotension  8/4 hypotensive over-night. Went for [facility] drain. Found what looked like old infected hematoma. PCCM asked to see post-op for shock 8/5 off pressors. WOB worse.  8/6 WOB & wheezing slightly worse. Progressing abdominal pain 8/7 Hgb declining w/ increasing intraperit hemorrhage 8/7 Transfused 2u PRBCs 8/9 work of breathing still significant w/ marked upper airway component  8/10 breathing better. 1.6 liters negative. Slight bump in scr. Vanc stopped. Getting OOB for first time 8/11 eating regular diet. Up in chair. Feeling better.   SUBJECTIVE: Pain a little better. Less SOB; eating  VITAL SIGNS: Temp:  [98 F (36.7 C)-99.4 F (37.4 C)] 98 F (36.7 C) (08/11 0800) Pulse Rate:  [88-102] 88 (08/11 0855) Resp:  [21-45] 21 (08/11 0855) BP: (135-166)/(52-68) 154/59 mmHg (08/11 0400) SpO2:  [78 %-100 %] 99 % (08/11 0934) Weight:  [127.3 kg (280 lb 10.3 oz)] 127.3 kg (280 lb 10.3 oz) (08/11 0400) 4 liters-->2 liters   HEMODYNAMICS: CVP:  [2 mmHg-20 mmHg] 2 mmHg VENTILATOR SETTINGS:   INTAKE / OUTPUT:  Intake/Output Summary (Last 24 hours) at 01/27/15 0946 Last data filed at 01/27/15 0900  Gross per 24 hour  Intake    950 ml  Output   1480 ml  Net   -530 ml    PHYSICAL EXAMINATION: General:  no distress. Feels better. No pain, now up in chair  Neuro:  Oriented x3. Moving all 4 extremities equally. Anxiety improved  HEENT:  No scleral icterus. Tacky MM. No oral ulcers. Cardiovascular:  Reg rate & rhythm. No edema. Lungs:  Decreased breath sounds bilateral bases w/ mild wheezing; predominately upper airway, this continues to improve.  Abdomen:  RUQ perc drain in place. pain Improved/resolved  Hypoactive BS. Protuberant. Musculoskeletal:  No joint effusions or deformities. Skin:  Warm & dry. No rash. No abdominal bruising. Trace edema   LABS:  CBC  Recent Labs Lab 01/25/15 0440  01/26/15 0545 01/26/15 1530 01/27/15 0345  WBC 12.9*  --  13.1*  --  12.7*  HGB 7.6*  < > 7.6* 7.9* 8.0*  HCT 23.0*  < > 23.8* 24.0* 24.1*  PLT 316  --  317  --  327  < > = values in this interval not displayed. Coag's  Recent Labs Lab 01/23/15 0754  APTT 38*  INR 1.32   BMET  Recent Labs Lab 01/25/15 0440 01/26/15 0545 01/27/15 0345  NA 138 140 138  K 4.1 3.9 3.6  CL 106 107 105  CO2  24 25 26   BUN 50* 52* 50*  CREATININE 3.32* 3.57* 3.51*  GLUCOSE 192* 133* 168*   Electrolytes  Recent Labs Lab 01/25/15 0440 01/26/15 0545 01/27/15 0345  CALCIUM 8.0* 8.2* 8.3*  MG 1.9 1.9 2.0  PHOS 4.4 4.4 3.6   Sepsis Markers  Recent Labs Lab 01/20/15 1420 01/20/15 1550 01/20/15 1807  LATICACIDVEN 1.6 1.8 0.8   ABG  Recent Labs Lab 01/21/15 0809  PHART 7.378  PCO2ART 38.8  PO2ART 68.6*   Liver Enzymes  Recent Labs Lab 01/25/15 0440 01/26/15 0545 01/27/15 0345  AST 28 20 17   ALT 35 26 22  ALKPHOS 93 84 77  BILITOT 1.5* 1.8* 1.7*  ALBUMIN 2.1* 2.1* 2.3*   Cardiac  Enzymes No results for input(s): TROPONINI, PROBNP in the last 168 hours. Glucose  Recent Labs Lab 01/26/15 0807 01/26/15 1150 01/26/15 1622 01/26/15 1932 01/26/15 2354 01/27/15 0322  GLUCAP 145* 200* 258* 182* 190* 165*    Imaging No results found. Elevated Right HD. Right > left patchy infiltrates ASSESSMENT / PLAN:  PULMONARY A: Chronically elevated right HD Evolving patchy pulm infiltrates on right  Acute Hypoxic Respiratory Failure Right pleural effusion - progessing on Abd CT 8/6 >his work of breathing remains significant. There is a significant upper airway component but also suspect atelectasis a major contributor at this point. Looks much better 8/10 and 8/11, weaning FIO2  P:   Wean FIO2  Supplemental oxygen for sats > 92% Xopenex neb q6hr Reflux precautions  Get OOB  CARDIOVASCULAR CVL Right IJ 8/4>> A:  Severe sepsis/septic shock - Shock resolved H/o PAF; CHADVASC score 4; currently NSR  Prior NSTEMI   P:  Hold all antihypertensives Monitor in tele Holding further lasix for now  Will see how he does over next 48-72 hours w/ simply [place] heparin. If hgb remains stable we can start heparin first; to ensure no bleeding issues. Would be sure that this is well tolerated for 48-72 hours before would consider oral anticoagulation   RENAL A:   ARF - Fxn seems to have leveled off  Hyperkalemia - Resolved  P:   Monitor UOP Monitor electrolytes daily Hold further diuretics Daily BUN/Creatinine   GASTROINTESTINAL A:   Infected hematoma s/p cholecystectomy - Now s/p CT guided Perc drain  GERD - worse 8/5 w/ associated UAW wheeze Intra-abdominal hemorrhage - Hgb stable now  P:   Diet and drain per surgery and IR services  Protonix  Reflux precautions Regular diet   HEMATOLOGIC A:   Anemia - Acute drop in Hgb this AM w/ intra-abdominal hemorrhage S/p 2 u PRBC on 8/4 & 2u 8/7> Hgb stable  P:  Change CBC to daily  SCDs Transfuse for Hbg < 7   Will start [place] heparin today   INFECTIOUS A:   Severe sepsis/septic shock - presume source is infected hematoma from recent Cholecystectomy. Shock resolved.  S/p Perc GB fossa site drain 8/4 - minimal serous output  P:   Trending leukocytosis Monitoring for fever Stopping vanc today   Zosyn 8/3>>> vanc 8/3>>>dc 8/10  Abscess 8/4>>>neg BCX2 8/4>>>neg   ENDOCRINE A: DM  P:   SSI Accuchecks q4hr Hold oral agents  NEUROLOGIC A:  Abdominal Pain/Peritonitis - Pain improving At risk for delirium   P:   RASS goal: n/a Supportive care Fentanyl IV prn   FAMILY  - Updates: Wife not at bedside at the time of my exam. Friend updated at bedside.  - Inter-disciplinary family meet or Palliative Care  meeting due by:  8/11  TODAY'S SUMMARY:  He looks like he may have turned the corner. He is hemodynamically stable. His breathing pattern has improved. His creatinine has leveled off. Push mobilization and continue care as outlined. Will start [place] heparin today. If no issues over next 48 hrs can start heparin gtt w/out bolus and then assess for bleeding then. Given his current creatinine would not be a candidate to go back on xarelto at this point.   Simonne Martinet ACNP-BC [facility] Pulmonary/Critical Care Pager # [phone] OR # [phone] if no answer  01/27/2015 9:46 AM

## 2015-01-28 LAB — CBC WITH DIFFERENTIAL/PLATELET
BASOS PCT: 0 % (ref 0–1)
Basophils Absolute: 0 10*3/uL (ref 0.0–0.1)
EOS ABS: 0.2 10*3/uL (ref 0.0–0.7)
EOS PCT: 1 % (ref 0–5)
HEMATOCRIT: 24.2 % — AB (ref 39.0–52.0)
Hemoglobin: 7.6 g/dL — ABNORMAL LOW (ref 13.0–17.0)
LYMPHS PCT: 8 % — AB (ref 12–46)
Lymphs Abs: 1 10*3/uL (ref 0.7–4.0)
MCH: 28.1 pg (ref 26.0–34.0)
MCHC: 31.4 g/dL (ref 30.0–36.0)
MCV: 89.6 fL (ref 78.0–100.0)
MONO ABS: 1.1 10*3/uL — AB (ref 0.1–1.0)
Monocytes Relative: 8 % (ref 3–12)
Neutro Abs: 10.6 10*3/uL — ABNORMAL HIGH (ref 1.7–7.7)
Neutrophils Relative %: 83 % — ABNORMAL HIGH (ref 43–77)
Platelets: 325 10*3/uL (ref 150–400)
RBC: 2.7 MIL/uL — ABNORMAL LOW (ref 4.22–5.81)
RDW: 16.2 % — ABNORMAL HIGH (ref 11.5–15.5)
WBC: 12.8 10*3/uL — ABNORMAL HIGH (ref 4.0–10.5)

## 2015-01-28 LAB — COMPREHENSIVE METABOLIC PANEL
ALT: 19 U/L (ref 17–63)
AST: 16 U/L (ref 15–41)
Albumin: 2.2 g/dL — ABNORMAL LOW (ref 3.5–5.0)
Alkaline Phosphatase: 70 U/L (ref 38–126)
Anion gap: 8 (ref 5–15)
BILIRUBIN TOTAL: 1.7 mg/dL — AB (ref 0.3–1.2)
BUN: 50 mg/dL — ABNORMAL HIGH (ref 6–20)
CALCIUM: 8.3 mg/dL — AB (ref 8.9–10.3)
CHLORIDE: 107 mmol/L (ref 101–111)
CO2: 24 mmol/L (ref 22–32)
Creatinine, Ser: 3.58 mg/dL — ABNORMAL HIGH (ref 0.61–1.24)
GFR, EST AFRICAN AMERICAN: 19 mL/min — AB (ref >60)
GFR, EST NON AFRICAN AMERICAN: 16 mL/min — AB (ref >60)
GLUCOSE: 131 mg/dL — AB (ref 65–99)
Potassium: 3.4 mmol/L — ABNORMAL LOW (ref 3.5–5.1)
Sodium: 139 mmol/L (ref 135–145)
Total Protein: 6 g/dL — ABNORMAL LOW (ref 6.5–8.1)

## 2015-01-28 LAB — GLUCOSE, CAPILLARY
GLUCOSE-CAPILLARY: 190 mg/dL — AB (ref 65–99)
Glucose-Capillary: 113 mg/dL — ABNORMAL HIGH (ref 65–99)
Glucose-Capillary: 130 mg/dL — ABNORMAL HIGH (ref 65–99)
Glucose-Capillary: 158 mg/dL — ABNORMAL HIGH (ref 65–99)
Glucose-Capillary: 164 mg/dL — ABNORMAL HIGH (ref 65–99)

## 2015-01-28 LAB — HEMOGLOBIN AND HEMATOCRIT, BLOOD
HCT: 25.3 % — ABNORMAL LOW (ref 39.0–52.0)
HEMOGLOBIN: 8.4 g/dL — AB (ref 13.0–17.0)

## 2015-01-28 LAB — MAGNESIUM: Magnesium: 1.8 mg/dL (ref 1.7–2.4)

## 2015-01-28 LAB — PHOSPHORUS: Phosphorus: 3.4 mg/dL (ref 2.5–4.6)

## 2015-01-28 MED ORDER — CETYLPYRIDINIUM CHLORIDE 0.05 % MT LIQD
7.0000 mL | Freq: Two times a day (BID) | OROMUCOSAL | Status: DC
  Administered 2015-01-28 – 2015-02-10 (×22): 7 mL via OROMUCOSAL

## 2015-01-28 NOTE — Progress Notes (Signed)
Patient ID: AMAHRI DENGEL, male   DOB: 1945-11-13, 69 y.o.   MRN: [id number]  General Surgery - [facility]  HD#: 10  Subjective: Patient states "feel much better".  Denies abdominal pain.  Having BM's.  Foley out - voiding.  Objective: Vital signs in last 24 hours: Temp:  [98 F (36.7 C)-99.5 F (37.5 C)] 98.3 F (36.8 C) (08/12 0308) Pulse Rate:  [84-96] 84 (08/12 0600) Resp:  [19-38] 32 (08/12 0600) BP: (102-154)/(32-69) 140/57 mmHg (08/12 0600) SpO2:  [78 %-99 %] 95 % (08/12 0600) Weight:  [127.4 kg (280 lb 13.9 oz)] 127.4 kg (280 lb 13.9 oz) (08/12 0323) Last BM Date: 01/27/15  Intake/Output from previous day: 08/11 0701 - 08/12 0700 In: 817.5 [P.O.:480; I.V.:250; IV Piggyback:87.5] Out: 905 [Urine:900; Drains:5] Intake/Output this shift:    Physical Exam: HEENT - sclerae clear, mucous membranes moist Neck - soft, IJ on right Chest - clear bilaterally Cor - tachy at 105 Abdomen - somewhat softer, BS present; wounds dry and intact; JP with dark red small output  Lab Results:   Recent Labs  01/27/15 0345 01/27/15 1603 01/28/15 0320  WBC 12.7*  --  12.8*  HGB 8.0* 7.9* 7.6*  HCT 24.1* 24.8* 24.2*  PLT 327  --  325   BMET  Recent Labs  01/27/15 0345 01/28/15 0320  NA 138 139  K 3.6 3.4*  CL 105 107  CO2 26 24  GLUCOSE 168* 131*  BUN 50* 50*  CREATININE 3.51* 3.58*  CALCIUM 8.3* 8.3*   PT/INR No results for input(s): LABPROT, INR in the last 72 hours. Comprehensive Metabolic Panel:    Component Value Date/Time   NA 139 01/28/2015 0320   NA 138 01/27/2015 0345   K 3.4* 01/28/2015 0320   K 3.6 01/27/2015 0345   CL 107 01/28/2015 0320   CL 105 01/27/2015 0345   CO2 24 01/28/2015 0320   CO2 26 01/27/2015 0345   BUN 50* 01/28/2015 0320   BUN 50* 01/27/2015 0345   CREATININE 3.58* 01/28/2015 0320   CREATININE 3.51* 01/27/2015 0345   GLUCOSE 131* 01/28/2015 0320   GLUCOSE 168* 01/27/2015 0345   CALCIUM 8.3* 01/28/2015 0320    CALCIUM 8.3* 01/27/2015 0345   AST 16 01/28/2015 0320   AST 17 01/27/2015 0345   ALT 19 01/28/2015 0320   ALT 22 01/27/2015 0345   ALKPHOS 70 01/28/2015 0320   ALKPHOS 77 01/27/2015 0345   BILITOT 1.7* 01/28/2015 0320   BILITOT 1.7* 01/27/2015 0345   PROT 6.0* 01/28/2015 0320   PROT 6.1* 01/27/2015 0345   ALBUMIN 2.2* 01/28/2015 0320   ALBUMIN 2.3* 01/27/2015 0345    Studies/Results: Ct Abdomen Pelvis Wo Contrast  01/28/2015   CLINICAL DATA:  Evaluate gallbladder fossa abscess/hematoma.  EXAM: CT ABDOMEN AND PELVIS WITHOUT CONTRAST  TECHNIQUE: Multidetector CT imaging of the abdomen and pelvis was performed following the standard protocol without IV contrast.  COMPARISON:  01/22/2015  FINDINGS: There are small bilateral pleural effusions, right side greater the left. Again noted is volume loss and consolidation throughout the right lower lobe which is similar to the prior examination. There is mild atelectasis at the left lung base.  Again noted is a heterogeneous collection in the gallbladder fossa containing high-density material and gas. This gallbladder fossa collection is difficult to measure on the non contrast examination but roughly measures 10.0 x 9.8 x 9.8 cm and previously measured 10.2 x 9.6 x 10.0 cm. There continues to be a  percutaneous drainage catheter within this gallbladder collection and this is not changed in position.  Persistent mixed density collection along the posterior and inferior aspect of the liver. This is compatible with a hematoma. This collection measures 11.8 cm in transverse dimension and previously measured 11.4 cm. In addition, there appears to be high-density fluid collections along the anterior right hepatic lobe on sequence 2, image 29. These are most compatible with hematomas and the largest measures 3.8 x 5.3 cm, previously measured 4.1 x 4.9 cm. The perihepatic hematoma extends up to the right hemidiaphragm. There continues to be a moderate amount of  ascites in the abdomen or pelvis which has not significantly changed.  There is a small amount of heterogeneous fluid along the lateral aspect of the spleen similar to the previous examination. No gross abnormality to the pancreas or adrenal glands. Small stones or calcifications in the left kidney lower pole without hydronephrosis. No acute abnormality to the right kidney.  Suspect a small hiatal hernia. No significant dilatation of small or large bowel.  There is diffuse subcutaneous edema throughout the abdomen and pelvis. Again noted is a right hip arthroplasty causing artifact in the pelvis. Multilevel degenerative changes in the thoracic and lumbar spine.  IMPRESSION: The gallbladder fossa hematoma/abscess has not significantly changed in size. Stable position of the percutaneous drainage catheter.  Stable appearance of the large perihepatic or subcapsular liver hematoma. Additional hematomas along the right anterior abdomen are stable. No significant change in the amount of ascites. Again noted is a small amount of perisplenic fluid.  Bilateral pleural effusions, right side greater the left. Stable volume loss and consolidation in the right lower lobe.   Electronically Signed   By: Richarda Overlie M.D.   On: 01/28/2015 07:23    Anti-infectives: Anti-infectives    Start     Dose/Rate Route Frequency Ordered Stop   01/24/15 2200  vancomycin (VANCOCIN) IVPB 1000 mg/200 mL premix     1,000 mg 200 mL/hr over 60 Minutes Intravenous  Once 01/24/15 1349 01/24/15 2301   01/21/15 2000  vancomycin (VANCOCIN) 1,500 mg in sodium chloride 0.9 % 500 mL IVPB  Status:  Discontinued     1,500 mg 250 mL/hr over 120 Minutes Intravenous Every 24 hours 01/21/15 1048 01/23/15 1726   01/20/15 2000  vancomycin (VANCOCIN) 1,250 mg in sodium chloride 0.9 % 250 mL IVPB  Status:  Discontinued     1,250 mg 166.7 mL/hr over 90 Minutes Intravenous Every 24 hours 01/19/15 1945 01/20/15 0843   01/20/15 2000  vancomycin (VANCOCIN)  1,250 mg in sodium chloride 0.9 % 250 mL IVPB  Status:  Discontinued     1,250 mg 166.7 mL/hr over 90 Minutes Intravenous Every 24 hours 01/20/15 1519 01/21/15 1048   01/20/15 0000  piperacillin-tazobactam (ZOSYN) IVPB 3.375 g     3.375 g 12.5 mL/hr over 240 Minutes Intravenous Every 8 hours 01/19/15 1946     01/19/15 1715  vancomycin (VANCOCIN) 2,500 mg in sodium chloride 0.9 % 500 mL IVPB     2,500 mg 250 mL/hr over 120 Minutes Intravenous  Once 01/19/15 1707 01/19/15 2159   01/19/15 1715  piperacillin-tazobactam (ZOSYN) IVPB 3.375 g     3.375 g 100 mL/hr over 30 Minutes Intravenous  Once 01/19/15 1707 01/19/15 1900      Assessment & Plans: 1. Status post lap chole with post op abscess Perc drain in place with limited drain output Post procedure hemorrhage - stable IV Zosyn 2. Acute blood loss anemia  Hgb slowly falling, 7.6 3. Acute on chronic renal insufficiency Creatinine stabilizing at 3.5 4. Respiratory failure / dyspnea BIPAP per CCM - poorly tolerated 5. Atrial fibrillation Consider restarting anticoagulation - per medical service - OK from surgical standpoint  CCM has signed off.  [facility] to assume medical management.  Will ask them to address anemia, possible transfusion, and further evaluation of renal insufficiency.  Appreciate CCM and [facility] assistance with this complex patient.  Velora Heckler, MD, [facility] Office: [phone]   Kemper Hochman Judie Petit 01/28/2015

## 2015-01-28 NOTE — Progress Notes (Signed)
Patient ID: Larry [name], male   DOB: May 08, 1946, 69 y.o.   MRN: [id number]    Referring Physician(s): CCS  Chief Complaint:  Gallbladder fossa abscess  Subjective:  Patient feeling a little better today. Still has some right upper quadrant discomfort. Tolerating diet okay. Denies nausea/ vomiting.  Allergies: Review of patient's allergies indicates no known allergies.  Medications: Prior to Admission medications   Medication Sig Start Date End Date Taking? Authorizing Provider  allopurinol (ZYLOPRIM) 300 MG tablet Take 300 mg by mouth daily.   Yes Historical Provider, MD  amLODipine (NORVASC) 10 MG tablet Take 10 mg by mouth every morning.    Yes Historical Provider, MD  apixaban (ELIQUIS) 5 MG TABS tablet Take 1 tablet (5 mg total) by mouth 2 (two) times daily. 10/01/14  Yes April Palumbo, MD  aspirin 81 MG tablet Take 81 mg by mouth daily.   Yes Historical Provider, MD  atorvastatin (LIPITOR) 80 MG tablet Take 80 mg by mouth at bedtime.    Yes Historical Provider, MD  Cholecalciferol (VITAMIN D PO) Take 2,000 Units by mouth daily.    Yes Historical Provider, MD  citalopram (CELEXA) 40 MG tablet Take 40 mg by mouth every morning.    Yes Historical Provider, MD  furosemide (LASIX) 40 MG tablet Take 40 mg by mouth every morning.    Yes Historical Provider, MD  glucose 4 GM chewable tablet Chew 1 tablet by mouth as needed for low blood sugar (only if BS IS BELOW 70).   Yes Historical Provider, MD  insulin glargine (LANTUS) 100 UNIT/ML injection Inject 40 Units into the skin at bedtime.   Yes Historical Provider, MD  magnesium oxide (MAG-OX) 400 MG tablet Take 400 mg by mouth 2 (two) times daily.    Yes Historical Provider, MD  metFORMIN (GLUCOPHAGE) 500 MG tablet Take 500 mg by mouth 2 (two) times daily with a meal.   Yes Historical Provider, MD  oxyCODONE-acetaminophen (PERCOCET/ROXICET) 5-325 MG per tablet Take 1-2 tablets by mouth every 4 (four) hours as needed for moderate pain.  01/05/15  Yes [name], MD  pantoprazole (PROTONIX) 40 MG tablet Take 40 mg by mouth daily.   Yes Historical Provider, MD  vitamin B-12 (CYANOCOBALAMIN) 500 MCG tablet Take 500 mcg by mouth daily.   Yes Historical Provider, MD  Liraglutide 18 MG/3ML SOPN Inject 1.2 mg into the skin daily. Pt states has been on hold since discharged 10-06-14 hospital visit    Historical Provider, MD  lisinopril (PRINIVIL,ZESTRIL) 40 MG tablet Take 40 mg by mouth 2 (two) times daily. On hold since 10-06-14    Historical Provider, MD  ondansetron (ZOFRAN) 8 MG tablet Take 8 mg by mouth 2 (two) times daily. Not taking-on hold form 10-06-14    Historical Provider, MD     Vital Signs: BP 143/64 mmHg  Pulse 98  Temp(Src) 98.6 F (37 C) (Oral)  Resp 26  Ht  (1.676 m)  Wt 280 lb 13.9 oz (127.4 kg)  BMI 45.35 kg/m2  SpO2 95%  Physical ExamPatient awake, alert. Right upper quadrant drain intact, insertion site okay, mild to moderately tender to palpation. Output minimal amount of blood tinged fluid; drain irrigated with 10 mL of sterile saline with minimal return.  Imaging: Ct Abdomen Pelvis Wo Contrast  01/28/2015   CLINICAL DATA:  Evaluate gallbladder fossa abscess/hematoma.  EXAM: CT ABDOMEN AND PELVIS WITHOUT CONTRAST  TECHNIQUE: Multidetector CT imaging of the abdomen and pelvis was performed following the standard protocol  without IV contrast.  COMPARISON:  01/22/2015  FINDINGS: There are small bilateral pleural effusions, right side greater the left. Again noted is volume loss and consolidation throughout the right lower lobe which is similar to the prior examination. There is mild atelectasis at the left lung base.  Again noted is a heterogeneous collection in the gallbladder fossa containing high-density material and gas. This gallbladder fossa collection is difficult to measure on the non contrast examination but roughly measures 10.0 x 9.8 x 9.8 cm and previously measured 10.2 x 9.6 x 10.0 cm. There  continues to be a percutaneous drainage catheter within this gallbladder collection and this is not changed in position.  Persistent mixed density collection along the posterior and inferior aspect of the liver. This is compatible with a hematoma. This collection measures 11.8 cm in transverse dimension and previously measured 11.4 cm. In addition, there appears to be high-density fluid collections along the anterior right hepatic lobe on sequence 2, image 29. These are most compatible with hematomas and the largest measures 3.8 x 5.3 cm, previously measured 4.1 x 4.9 cm. The perihepatic hematoma extends up to the right hemidiaphragm. There continues to be a moderate amount of ascites in the abdomen or pelvis which has not significantly changed.  There is a small amount of heterogeneous fluid along the lateral aspect of the spleen similar to the previous examination. No gross abnormality to the pancreas or adrenal glands. Small stones or calcifications in the left kidney lower pole without hydronephrosis. No acute abnormality to the right kidney.  Suspect a small hiatal hernia. No significant dilatation of small or large bowel.  There is diffuse subcutaneous edema throughout the abdomen and pelvis. Again noted is a right hip arthroplasty causing artifact in the pelvis. Multilevel degenerative changes in the thoracic and lumbar spine.  IMPRESSION: The gallbladder fossa hematoma/abscess has not significantly changed in size. Stable position of the percutaneous drainage catheter.  Stable appearance of the large perihepatic or subcapsular liver hematoma. Additional hematomas along the right anterior abdomen are stable. No significant change in the amount of ascites. Again noted is a small amount of perisplenic fluid.  Bilateral pleural effusions, right side greater the left. Stable volume loss and consolidation in the right lower lobe.   Electronically Signed   By: Richarda Overlie M.D.   On: 01/28/2015 07:23     Labs:  CBC:  Recent Labs  01/25/15 0440  01/26/15 0545 01/26/15 1530 01/27/15 0345 01/27/15 1603 01/28/15 0320  WBC 12.9*  --  13.1*  --  12.7*  --  12.8*  HGB 7.6*  < > 7.6* 7.9* 8.0* 7.9* 7.6*  HCT 23.0*  < > 23.8* 24.0* 24.1* 24.8* 24.2*  PLT 316  --  317  --  327  --  325  < > = values in this interval not displayed.  COAGS:  Recent Labs  10/06/14 0445 01/19/15 1613 01/23/15 0754  INR 1.07 1.46 1.32  APTT 34 35 38*    BMP:  Recent Labs  01/25/15 0440 01/26/15 0545 01/27/15 0345 01/28/15 0320  NA 138 140 138 139  K 4.1 3.9 3.6 3.4*  CL 106 107 105 107  CO2 24 25 26 24   GLUCOSE 192* 133* 168* 131*  BUN 50* 52* 50* 50*  CALCIUM 8.0* 8.2* 8.3* 8.3*  CREATININE 3.32* 3.57* 3.51* 3.58*  GFRNONAA 18* 16* 16* 16*  GFRAA 20* 19* 19* 19*    LIVER FUNCTION TESTS:  Recent Labs  01/25/15 0440 01/26/15 0545  01/27/15 0345 01/28/15 0320  BILITOT 1.5* 1.8* 1.7* 1.7*  AST 28 20 17 16   ALT 35 26 22 19   ALKPHOS 93 84 77 70  PROT 6.0* 5.8* 6.1* 6.0*  ALBUMIN 2.1* 2.1* 2.3* 2.2*    Assessment and Plan: Status post gallbladder fossa abscess drain 8/4; patient is afebrile, WBC 12.8, hemoglobin 7.6, creatinine 3.58; blood cultures negative, gallbladder fossa abscess cultures with multiple organisms present, none predominant. Recent CT scan of abdomen /pelvis reviewed today. No significant change in gallbladder fossa hematoma/abscess size. Stable position of drain catheter. Stable appearance of perihepatic and subcapsular hematoma as well as along the right anterior abdomen. No change in amount of ascites. Bilateral pleural effusions, right greater than left. Recommend continuation of drain at this time with 10 mL sterile saline irrigation every 8 hours. If clinical status worsens can attempt upsizing of drain to hopefully expedite output.  Additional plans as per CCM.   Signed: D. Jeananne Rama 01/28/2015, 11:02 AM   I spent a total of 15 minutes in face to  face in clinical consultation/evaluation, greater than 50% of which was counseling/coordinating care for gallbladder fossa abscess drain

## 2015-01-29 DIAGNOSIS — A419 Sepsis, unspecified organism: Secondary | ICD-10-CM

## 2015-01-29 LAB — COMPREHENSIVE METABOLIC PANEL
ALT: 16 U/L — ABNORMAL LOW (ref 17–63)
AST: 19 U/L (ref 15–41)
Albumin: 2.1 g/dL — ABNORMAL LOW (ref 3.5–5.0)
Alkaline Phosphatase: 73 U/L (ref 38–126)
Anion gap: 6 (ref 5–15)
BUN: 50 mg/dL — ABNORMAL HIGH (ref 6–20)
CO2: 26 mmol/L (ref 22–32)
CREATININE: 3.69 mg/dL — AB (ref 0.61–1.24)
Calcium: 8.5 mg/dL — ABNORMAL LOW (ref 8.9–10.3)
Chloride: 108 mmol/L (ref 101–111)
GFR calc Af Amer: 18 mL/min — ABNORMAL LOW (ref >60)
GFR calc non Af Amer: 16 mL/min — ABNORMAL LOW (ref >60)
GLUCOSE: 173 mg/dL — AB (ref 65–99)
POTASSIUM: 3.5 mmol/L (ref 3.5–5.1)
Sodium: 140 mmol/L (ref 135–145)
Total Bilirubin: 1.5 mg/dL — ABNORMAL HIGH (ref 0.3–1.2)
Total Protein: 5.9 g/dL — ABNORMAL LOW (ref 6.5–8.1)

## 2015-01-29 LAB — GLUCOSE, CAPILLARY
GLUCOSE-CAPILLARY: 165 mg/dL — AB (ref 65–99)
GLUCOSE-CAPILLARY: 203 mg/dL — AB (ref 65–99)
GLUCOSE-CAPILLARY: 237 mg/dL — AB (ref 65–99)
Glucose-Capillary: 140 mg/dL — ABNORMAL HIGH (ref 65–99)
Glucose-Capillary: 203 mg/dL — ABNORMAL HIGH (ref 65–99)
Glucose-Capillary: 210 mg/dL — ABNORMAL HIGH (ref 65–99)
Glucose-Capillary: 219 mg/dL — ABNORMAL HIGH (ref 65–99)

## 2015-01-29 LAB — CBC WITH DIFFERENTIAL/PLATELET
BASOS PCT: 0 % (ref 0–1)
Basophils Absolute: 0 10*3/uL (ref 0.0–0.1)
Eosinophils Absolute: 0.1 10*3/uL (ref 0.0–0.7)
Eosinophils Relative: 1 % (ref 0–5)
HCT: 23.7 % — ABNORMAL LOW (ref 39.0–52.0)
Hemoglobin: 7.5 g/dL — ABNORMAL LOW (ref 13.0–17.0)
Lymphocytes Relative: 6 % — ABNORMAL LOW (ref 12–46)
Lymphs Abs: 0.8 10*3/uL (ref 0.7–4.0)
MCH: 28.6 pg (ref 26.0–34.0)
MCHC: 31.6 g/dL (ref 30.0–36.0)
MCV: 90.5 fL (ref 78.0–100.0)
MONOS PCT: 9 % (ref 3–12)
Monocytes Absolute: 1.1 10*3/uL — ABNORMAL HIGH (ref 0.1–1.0)
Neutro Abs: 10.4 10*3/uL — ABNORMAL HIGH (ref 1.7–7.7)
Neutrophils Relative %: 84 % — ABNORMAL HIGH (ref 43–77)
PLATELETS: 342 10*3/uL (ref 150–400)
RBC: 2.62 MIL/uL — ABNORMAL LOW (ref 4.22–5.81)
RDW: 16.2 % — ABNORMAL HIGH (ref 11.5–15.5)
WBC: 12.5 10*3/uL — ABNORMAL HIGH (ref 4.0–10.5)

## 2015-01-29 LAB — MAGNESIUM: Magnesium: 1.8 mg/dL (ref 1.7–2.4)

## 2015-01-29 LAB — HEMOGLOBIN AND HEMATOCRIT, BLOOD
HEMATOCRIT: 23.3 % — AB (ref 39.0–52.0)
Hemoglobin: 7.7 g/dL — ABNORMAL LOW (ref 13.0–17.0)

## 2015-01-29 LAB — PHOSPHORUS: Phosphorus: 3.2 mg/dL (ref 2.5–4.6)

## 2015-01-29 MED ORDER — PIPERACILLIN-TAZOBACTAM IN DEX 2-0.25 GM/50ML IV SOLN
2.2500 g | Freq: Three times a day (TID) | INTRAVENOUS | Status: DC
  Administered 2015-01-29 – 2015-02-04 (×17): 2.25 g via INTRAVENOUS
  Filled 2015-01-29 (×19): qty 50

## 2015-01-29 NOTE — Progress Notes (Addendum)
PULMONARY / CRITICAL CARE MEDICINE   Name: Larry [name] MRN: [id number] DOB: 1946-03-11    ADMISSION DATE:  01/19/2015 CONSULTATION DATE:  8/4  REFERRING MD :  [name]  CHIEF COMPLAINT:  Septic shock   INITIAL PRESENTATION:  69 year old male w/ CAF on elliquis recently underwent lap chole on 7/14. Was discharged to home w/drain. Drain removed about a week before presentation to ER. Admitted directly from [facility] on 8/3 w/ septic shock which was likely due to infected hematoma in the GB fossa. PCCM asked to see after new perc drain placed on 8/4, when he remained hypotensive.   STUDIES:  CT abdomen/Pelvis W/ 7/28 - Gas & fluid at cholecystectomy site w/o discrete abscess. Min ascites. Small right pleural eff. CT abdomen/pelvis w/o 8/5 - Gas between liver & stomach. No additional free air. Increased flow void & high density layering c/w increasing hemorrhage as well as increased right pleural eff.  SIGNIFICANT EVENTS: Admitted 8/3 w/ abd pain and hypotension  8/4 hypotensive over-night. Went for [facility] drain. Found what looked like old infected hemato ma. PCCM asked to see post-op for shock 8/5 off pressors. WOB worse.  8/6 WOB & wheezing slightly worse. Progressing abdominal pain 8/7 Hgb declining w/ increasing intraperit hemorrhage 8/7 Transfused 2u PRBCs 8/9 work of breathing still significant w/ marked upper airway component  8/10 breathing better. 1.6 liters negative. Slight bump in scr. Vanc stopped. Getting OOB for first time 8/11 eating regular diet. Up in chair. Feeling better.  8/13 no events outside of decreased urine output. Dr Sherene Sires started IV at 125/hr  SUBJECTIVE: Patient states he is feeling better. He has made more of an effort to eat. Sister at bedside states he feels better.   VITAL SIGNS: Temp:  [97.7 F (36.5 C)-99.1 F (37.3 C)] 98.5 F (36.9 C) (08/13 2011) Pulse Rate:  [95-101] 95 (08/13 1600) Resp:  [20-35] 23 (08/13 1800) BP:  (133-165)/(57-72) 133/63 mmHg (08/13 1600) SpO2:  [91 %-97 %] 96 % (08/13 1800) 4 liters-->2 liters  HEMODYNAMICS: CVP:  [10 mmHg] 10 mmHg VENTILATOR SETTINGS:   INTAKE / OUTPUT:  Intake/Output Summary (Last 24 hours) at 01/29/15 2016 Last data filed at 01/29/15 1624  Gross per 24 hour  Intake    310 ml  Output    225 ml  Net     85 ml    PHYSICAL EXAMINATION: General:  no distress. Feels better. Mild abdominal pain, lying in bed.   Neuro:  Oriented x3. Moving all 4 extremities equally. Anxiety improved  HEENT:  No scleral icterus. Dry mucosas. No oral ulcers. Cardiovascular:  Reg rate & rhythm. S1S2, no thrills No edema. Lungs:  Decreased breath sounds bilateral bases but no accessory lung sounds. Equal expansion.  Abdomen:  Soft, mildly tender, not distended. Hypoactive BS. Protuberant. Musculoskeletal:  No joint effusions or deformities. Skin:  Warm & dry. No rash. No abdominal bruising. Trace edema   LABS:  CBC  Recent Labs Lab 01/27/15 0345  01/28/15 0320 01/28/15 1628 01/29/15 0517 01/29/15 1437  WBC 12.7*  --  12.8*  --  12.5*  --   HGB 8.0*  < > 7.6* 8.4* 7.5* 7.7*  HCT 24.1*  < > 24.2* 25.3* 23.7* 23.3*  PLT 327  --  325  --  342  --   < > = values in this interval not displayed. Coag's  Recent Labs Lab 01/23/15 0754  APTT 38*  INR 1.32   BMET  Recent  Labs Lab 01/27/15 0345 01/28/15 0320 01/29/15 0517  NA 138 139 140  K 3.6 3.4* 3.5  CL 105 107 108  CO2 26 24 26   BUN 50* 50* 50*  CREATININE 3.51* 3.58* 3.69*  GLUCOSE 168* 131* 173*   Electrolytes  Recent Labs Lab 01/27/15 0345 01/28/15 0320 01/29/15 0517  CALCIUM 8.3* 8.3* 8.5*  MG 2.0 1.8 1.8  PHOS 3.6 3.4 3.2   Sepsis Markers No results for input(s): LATICACIDVEN, PROCALCITON, O2SATVEN in the last 168 hours. ABG No results for input(s): PHART, PCO2ART, PO2ART in the last 168 hours. Liver Enzymes  Recent Labs Lab 01/27/15 0345 01/28/15 0320 01/29/15 0517  AST 17 16 19    ALT 22 19 16*  ALKPHOS 77 70 73  BILITOT 1.7* 1.7* 1.5*  ALBUMIN 2.3* 2.2* 2.1*   Cardiac Enzymes No results for input(s): TROPONINI, PROBNP in the last 168 hours. Glucose  Recent Labs Lab 01/28/15 1951 01/29/15 0001 01/29/15 0328 01/29/15 0806 01/29/15 1143 01/29/15 1551  GLUCAP 164* 165* 203* 140* 203* 237*    Imaging No results found. Elevated Right HD. Right > left patchy infiltrates ASSESSMENT / PLAN:  PULMONARY A: Chronically elevated right HD Evolving patchy pulm infiltrates on right  Acute Hypoxic Respiratory Failure, resolved Right pleural effusion - progessing on Abd CT 8/6 >his work of breathing has improved per nursing staff. Comfortable on 2L [place]  P:   Wean FIO2, currently on 2L  Supplemental oxygen for sats > 92% Xopenex neb q6hr Reflux precautions  Get OOB  CARDIOVASCULAR CVL Right IJ 8/4>> now day 9 A:  Severe sepsis/septic shock - Shock resolved H/o PAF; CHADVASC score 4; currently NSR  Prior NSTEMI   P:  Hold all antihypertensives Monitor in tele Holding further lasix for now  Consider discontinuing central line Start full dose AC in AM for a.fib  RENAL A:   ARF - Fxn seems to have leveled off  Hyperkalemia - Resolved  P:   Monitor UOP Monitor electrolytes daily Hold further diuretics Daily BUN/Creatinine   GASTROINTESTINAL A:   Infected hematoma s/p cholecystectomy - Now s/p CT guided Perc drain  GERD - worse 8/5 w/ associated UAW wheeze Intra-abdominal hemorrhage - Hgb stable now  P:   Diet and drain per surgery and IR services  Protonix  Reflux precautions Regular diet, although this has not improved.   HEMATOLOGIC A:   Anemia - s/p ABLA - tolerating heparin  P:  Daily CBCs SCDs Transfuse for Hbg < 7   INFECTIOUS A:   Severe sepsis/septic shock - presume source is infected hematoma from recent Cholecystectomy. Shock resolved.  S/p Perc GB fossa site drain 8/4 - minimal serous output  P:   Trending  leukocytosis Monitoring for fever  Zosyn 8/3>>> vanc 8/3>>>dc 8/10  Abscess 8/4>>>neg BCX2 8/4>>>neg   ENDOCRINE A: DM type 2  P:   SSI Accuchecks q4hr Hold oral agents  NEUROLOGIC A: no active issues Abdominal Pain/Peritonitis - Pain improving At risk for delirium   P:   RASS goal: n/a Supportive care Fentanyl IV prn   FAMILY  - Updates: Care discussed with patient. Sister was at bedside. I answered her questions to the best of my ability. Patient gave me permission to discuss his care with her.   TODAY'S SUMMARY: no significant events noted. Decreased urine output this evening.   Deetta Perla, MD Critical Care Medicine [facility] Pulmonary/Critical Care   01/29/2015 8:16 PM  Addendum: I spent 32 minutes in the care of this patient  as well as updating the patient and sister about his current condition.   Deetta Perla, MD Critical Care Medicine [facility] Pulmonary/Critical Care
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
## 2015-01-29 NOTE — Progress Notes (Signed)
   It appears CCM to [facility] - consultative service hand off did not happen for [facility] pik up 01/28/15. D/w Dr Kathreen Devoid of [facility] - [facility] will see patient 01/30/15 CCS is primary   Dr. Kalman Shan, M.D., [facility].C.P Pulmonary and Critical Care Medicine Staff Physician [facility] Pulmonary and Critical Care Pager: [phone], If no answer or between  15:00h - 7:00h: call [phone]  01/29/2015 9:45 AM

## 2015-01-29 NOTE — Progress Notes (Signed)
Patient ID: Larry [name], male   DOB: Nov 14, 1945, 69 y.o.   MRN: [id number]  General Surgery - [facility]  HD#: 11  Subjective: Patient feeling better.  Denies abdominal pain.  Having BM's.  Foley out - voiding.  Difficulty with ambulation due to edema  Objective: Vital signs in last 24 hours: Temp:  [97.7 F (36.5 C)-98.8 F (37.1 C)] 98.8 F (37.1 C) (08/13 0330) Pulse Rate:  [90-98] 97 (08/13 0200) Resp:  [26-47] 35 (08/13 0200) BP: (114-151)/(54-72) 139/72 mmHg (08/13 0200) SpO2:  [93 %-97 %] 96 % (08/13 0200) Last BM Date: 01/28/15  Intake/Output from previous day: 08/12 0701 - 08/13 0700 In: 275 [I.V.:120; IV Piggyback:150] Out: 190 [Urine:190] Intake/Output this shift:    Physical Exam: HEENT - sclerae clear, mucous membranes moist Neck - soft, IJ on right Chest - clear bilaterally Cor - tachy at 105 Abdomen - somewhat softer, BS present; wounds dry and intact; JP with dark red small output Ext: 2+ pitting edema, all 4 extremities  Lab Results:   Recent Labs  01/28/15 0320 01/28/15 1628 01/29/15 0517  WBC 12.8*  --  12.5*  HGB 7.6* 8.4* 7.5*  HCT 24.2* 25.3* 23.7*  PLT 325  --  342   BMET  Recent Labs  01/28/15 0320 01/29/15 0517  NA 139 140  K 3.4* 3.5  CL 107 108  CO2 24 26  GLUCOSE 131* 173*  BUN 50* 50*  CREATININE 3.58* 3.69*  CALCIUM 8.3* 8.5*   PT/INR No results for input(s): LABPROT, INR in the last 72 hours. Comprehensive Metabolic Panel:    Component Value Date/Time   NA 140 01/29/2015 0517   NA 139 01/28/2015 0320   K 3.5 01/29/2015 0517   K 3.4* 01/28/2015 0320   CL 108 01/29/2015 0517   CL 107 01/28/2015 0320   CO2 26 01/29/2015 0517   CO2 24 01/28/2015 0320   BUN 50* 01/29/2015 0517   BUN 50* 01/28/2015 0320   CREATININE 3.69* 01/29/2015 0517   CREATININE 3.58* 01/28/2015 0320   GLUCOSE 173* 01/29/2015 0517   GLUCOSE 131* 01/28/2015 0320   CALCIUM 8.5* 01/29/2015 0517   CALCIUM 8.3* 01/28/2015  0320   AST 19 01/29/2015 0517   AST 16 01/28/2015 0320   ALT 16* 01/29/2015 0517   ALT 19 01/28/2015 0320   ALKPHOS 73 01/29/2015 0517   ALKPHOS 70 01/28/2015 0320   BILITOT 1.5* 01/29/2015 0517   BILITOT 1.7* 01/28/2015 0320   PROT 5.9* 01/29/2015 0517   PROT 6.0* 01/28/2015 0320   ALBUMIN 2.1* 01/29/2015 0517   ALBUMIN 2.2* 01/28/2015 0320    Studies/Results: Ct Abdomen Pelvis Wo Contrast  01/28/2015   CLINICAL DATA:  Evaluate gallbladder fossa abscess/hematoma.  EXAM: CT ABDOMEN AND PELVIS WITHOUT CONTRAST  TECHNIQUE: Multidetector CT imaging of the abdomen and pelvis was performed following the standard protocol without IV contrast.  COMPARISON:  01/22/2015  FINDINGS: There are small bilateral pleural effusions, right side greater the left. Again noted is volume loss and consolidation throughout the right lower lobe which is similar to the prior examination. There is mild atelectasis at the left lung base.  Again noted is a heterogeneous collection in the gallbladder fossa containing high-density material and gas. This gallbladder fossa collection is difficult to measure on the non contrast examination but roughly measures 10.0 x 9.8 x 9.8 cm and previously measured 10.2 x 9.6 x 10.0 cm. There continues to be a percutaneous drainage catheter within this gallbladder  collection and this is not changed in position.  Persistent mixed density collection along the posterior and inferior aspect of the liver. This is compatible with a hematoma. This collection measures 11.8 cm in transverse dimension and previously measured 11.4 cm. In addition, there appears to be high-density fluid collections along the anterior right hepatic lobe on sequence 2, image 29. These are most compatible with hematomas and the largest measures 3.8 x 5.3 cm, previously measured 4.1 x 4.9 cm. The perihepatic hematoma extends up to the right hemidiaphragm. There continues to be a moderate amount of ascites in the abdomen or  pelvis which has not significantly changed.  There is a small amount of heterogeneous fluid along the lateral aspect of the spleen similar to the previous examination. No gross abnormality to the pancreas or adrenal glands. Small stones or calcifications in the left kidney lower pole without hydronephrosis. No acute abnormality to the right kidney.  Suspect a small hiatal hernia. No significant dilatation of small or large bowel.  There is diffuse subcutaneous edema throughout the abdomen and pelvis. Again noted is a right hip arthroplasty causing artifact in the pelvis. Multilevel degenerative changes in the thoracic and lumbar spine.  IMPRESSION: The gallbladder fossa hematoma/abscess has not significantly changed in size. Stable position of the percutaneous drainage catheter.  Stable appearance of the large perihepatic or subcapsular liver hematoma. Additional hematomas along the right anterior abdomen are stable. No significant change in the amount of ascites. Again noted is a small amount of perisplenic fluid.  Bilateral pleural effusions, right side greater the left. Stable volume loss and consolidation in the right lower lobe.   Electronically Signed   By: Richarda Overlie M.D.   On: 01/28/2015 07:23    Anti-infectives: Anti-infectives    Start     Dose/Rate Route Frequency Ordered Stop   01/24/15 2200  vancomycin (VANCOCIN) IVPB 1000 mg/200 mL premix     1,000 mg 200 mL/hr over 60 Minutes Intravenous  Once 01/24/15 1349 01/24/15 2301   01/21/15 2000  vancomycin (VANCOCIN) 1,500 mg in sodium chloride 0.9 % 500 mL IVPB  Status:  Discontinued     1,500 mg 250 mL/hr over 120 Minutes Intravenous Every 24 hours 01/21/15 1048 01/23/15 1726   01/20/15 2000  vancomycin (VANCOCIN) 1,250 mg in sodium chloride 0.9 % 250 mL IVPB  Status:  Discontinued     1,250 mg 166.7 mL/hr over 90 Minutes Intravenous Every 24 hours 01/19/15 1945 01/20/15 0843   01/20/15 2000  vancomycin (VANCOCIN) 1,250 mg in sodium chloride  0.9 % 250 mL IVPB  Status:  Discontinued     1,250 mg 166.7 mL/hr over 90 Minutes Intravenous Every 24 hours 01/20/15 1519 01/21/15 1048   01/20/15 0000  piperacillin-tazobactam (ZOSYN) IVPB 3.375 g     3.375 g 12.5 mL/hr over 240 Minutes Intravenous Every 8 hours 01/19/15 1946     01/19/15 1715  vancomycin (VANCOCIN) 2,500 mg in sodium chloride 0.9 % 500 mL IVPB     2,500 mg 250 mL/hr over 120 Minutes Intravenous  Once 01/19/15 1707 01/19/15 2159   01/19/15 1715  piperacillin-tazobactam (ZOSYN) IVPB 3.375 g     3.375 g 100 mL/hr over 30 Minutes Intravenous  Once 01/19/15 1707 01/19/15 1900      Assessment & Plans: 1. Status post lap chole with post op abscess Perc drain in place with limited drain output Post procedure hemorrhage - stable IV Zosyn: cultures show multiple organisms  2. Acute blood loss anemia Hgb  slowly falling, 7.5 3. Acute on chronic renal insufficiency Creatinine stabilizing at 3.5 4. Respiratory failure / dyspnea BIPAP per CCM - poorly tolerated 5. Atrial fibrillation Consider restarting anticoagulation - per medical service - OK from surgical standpoint 6. Needs PT and OT eval  CCM has signed off.  [facility] to assume medical management.  Will ask them to address anemia, need for transfusion, and further evaluation of renal insufficiency.  Appreciate [facility]CM and [facility] assistance with this complex patient.    Kaysin Brock C. 01/29/2015

## 2015-01-29 NOTE — Progress Notes (Signed)
eLink Physician-Brief Progress Note Patient Name: [name] DOB: August 31, 1945 MRN: [id number]   Date of Service  01/29/2015  HPI/Events of Note  Low uop   Intake/Output Summary (Last 24 hours) at 01/29/15 1811 Last data filed at 01/29/15 1500  Gross per 24 hour  Intake    285 ml  Output    325 ml  Net    -40 ml      Recent Labs Lab 01/28/15 1628 01/29/15 0517 01/29/15 1437  HGB 8.4* 7.5* 7.7*     Lab Results  Component Value Date   CREATININE 3.69* 01/29/2015   CREATININE 3.58* 01/28/2015   CREATININE 3.51* 01/27/2015     eICU Interventions  Increase IV fluids to 125 cc /hr      Intervention Category Major Interventions: Acute renal failure - evaluation and management  [name] 01/29/2015, 6:11 PM

## 2015-01-29 NOTE — Plan of Care (Signed)
Problem: Phase III Progression Outcomes Goal: Pain controlled on oral analgesia Outcome: Not Applicable Date Met:  99/27/80 Patient still receiving IV fentanyl for pain. Goal: Activity at appropriate level-compared to baseline (UP IN CHAIR FOR HEMODIALYSIS)  Outcome: Not Progressing Patient is extremely weak and becomes shob with minimal movement. Goal: Voiding independently Outcome: Not Progressing Patient has difficult starting urine stream.  Minimal urine output.  Scrotum is edematous and also interferes with voiding into the urinal. Goal: IV/normal saline lock discontinued Outcome: Not Progressing Central venous line in use. Goal: Other Phase III Outcomes/Goals Outcome: Not Progressing Extremely poor nutrition.  The patient is not eating and rarely drinking fluids. Encouraging fluids and food.   Problem: Discharge Progression Outcomes Goal: Hemodynamically stable Outcome: Not Progressing O2 sats dropped to 87% on room air. Goal: Tolerating diet Outcome: Not Progressing Complete loss of appetite.

## 2015-01-29 NOTE — Progress Notes (Signed)
Urine output 255 ml, plus small spot on the bed pad.  Patient has only sipped a small amount of liquids today.  He ate no breakfast, 10 bites of lunch and no dinner.  He tires immediately upon minimal exertion.  Patient wants to sleep continuously. Minimal pain medication administered today.  Attempt to control patient's pain without contributing to drownsiness.  Flushed JP drain with 10 ml normal saline.  No output from JP drain today.  Called [facility] answering service to notified MD of urine output and update on patient's status.  Await call back.

## 2015-01-30 ENCOUNTER — Inpatient Hospital Stay (HOSPITAL_COMMUNITY): Payer: Medicare Other

## 2015-01-30 DIAGNOSIS — E119 Type 2 diabetes mellitus without complications: Secondary | ICD-10-CM

## 2015-01-30 LAB — COMPREHENSIVE METABOLIC PANEL
ALBUMIN: 2.1 g/dL — AB (ref 3.5–5.0)
ALK PHOS: 65 U/L (ref 38–126)
ALT: 14 U/L — ABNORMAL LOW (ref 17–63)
ANION GAP: 5 (ref 5–15)
AST: 16 U/L (ref 15–41)
BUN: 46 mg/dL — AB (ref 6–20)
CALCIUM: 8 mg/dL — AB (ref 8.9–10.3)
CO2: 24 mmol/L (ref 22–32)
CREATININE: 3.6 mg/dL — AB (ref 0.61–1.24)
Chloride: 101 mmol/L (ref 101–111)
GFR calc non Af Amer: 16 mL/min — ABNORMAL LOW (ref >60)
GFR, EST AFRICAN AMERICAN: 19 mL/min — AB (ref >60)
GLUCOSE: 389 mg/dL — AB (ref 65–99)
Potassium: 3.2 mmol/L — ABNORMAL LOW (ref 3.5–5.1)
SODIUM: 130 mmol/L — AB (ref 135–145)
TOTAL PROTEIN: 5.6 g/dL — AB (ref 6.5–8.1)
Total Bilirubin: 1.3 mg/dL — ABNORMAL HIGH (ref 0.3–1.2)

## 2015-01-30 LAB — CBC WITH DIFFERENTIAL/PLATELET
BASOS ABS: 0 10*3/uL (ref 0.0–0.1)
BASOS PCT: 0 % (ref 0–1)
EOS ABS: 0.1 10*3/uL (ref 0.0–0.7)
Eosinophils Relative: 1 % (ref 0–5)
HEMATOCRIT: 22.3 % — AB (ref 39.0–52.0)
Hemoglobin: 7.3 g/dL — ABNORMAL LOW (ref 13.0–17.0)
LYMPHS ABS: 0.7 10*3/uL (ref 0.7–4.0)
LYMPHS PCT: 6 % — AB (ref 12–46)
MCH: 29.8 pg (ref 26.0–34.0)
MCHC: 32.7 g/dL (ref 30.0–36.0)
MCV: 91 fL (ref 78.0–100.0)
MONO ABS: 1.2 10*3/uL — AB (ref 0.1–1.0)
Monocytes Relative: 10 % (ref 3–12)
NEUTROS ABS: 9.8 10*3/uL — AB (ref 1.7–7.7)
Neutrophils Relative %: 83 % — ABNORMAL HIGH (ref 43–77)
Platelets: 336 10*3/uL (ref 150–400)
RBC: 2.45 MIL/uL — ABNORMAL LOW (ref 4.22–5.81)
RDW: 16.3 % — AB (ref 11.5–15.5)
WBC: 11.9 10*3/uL — ABNORMAL HIGH (ref 4.0–10.5)

## 2015-01-30 LAB — URINALYSIS, ROUTINE W REFLEX MICROSCOPIC
Bilirubin Urine: NEGATIVE
GLUCOSE, UA: NEGATIVE mg/dL
KETONES UR: NEGATIVE mg/dL
NITRITE: NEGATIVE
PROTEIN: 100 mg/dL — AB
Specific Gravity, Urine: 1.016 (ref 1.005–1.030)
Urobilinogen, UA: 0.2 mg/dL (ref 0.0–1.0)
pH: 5.5 (ref 5.0–8.0)

## 2015-01-30 LAB — SODIUM, URINE, RANDOM: Sodium, Ur: 11 mmol/L

## 2015-01-30 LAB — GLUCOSE, CAPILLARY
GLUCOSE-CAPILLARY: 274 mg/dL — AB (ref 65–99)
GLUCOSE-CAPILLARY: 185 mg/dL — AB (ref 65–99)
GLUCOSE-CAPILLARY: 175 mg/dL — AB (ref 65–99)
GLUCOSE-CAPILLARY: 166 mg/dL — AB (ref 65–99)
Glucose-Capillary: 171 mg/dL — ABNORMAL HIGH (ref 65–99)

## 2015-01-30 LAB — URINE MICROSCOPIC-ADD ON

## 2015-01-30 LAB — HEMOGLOBIN AND HEMATOCRIT, BLOOD
HCT: 23.7 % — ABNORMAL LOW (ref 39.0–52.0)
HEMOGLOBIN: 7.5 g/dL — AB (ref 13.0–17.0)

## 2015-01-30 LAB — MAGNESIUM: MAGNESIUM: 1.7 mg/dL (ref 1.7–2.4)

## 2015-01-30 LAB — PHOSPHORUS: Phosphorus: 3.2 mg/dL (ref 2.5–4.6)

## 2015-01-30 LAB — HEPARIN LEVEL (UNFRACTIONATED): Heparin Unfractionated: 0.13 IU/mL — ABNORMAL LOW (ref 0.30–0.70)

## 2015-01-30 LAB — CREATININE, URINE, RANDOM: Creatinine, Urine: 158.03 mg/dL

## 2015-01-30 MED ORDER — HEPARIN (PORCINE) IN NACL 100-0.45 UNIT/ML-% IJ SOLN
1100.0000 [IU]/h | INTRAMUSCULAR | Status: DC
  Administered 2015-01-30: 1100 [IU]/h via INTRAVENOUS
  Filled 2015-01-30: qty 250

## 2015-01-30 MED ORDER — NYSTATIN 100000 UNIT/GM EX POWD
Freq: Three times a day (TID) | CUTANEOUS | Status: DC
  Administered 2015-01-30: 22:00:00 via TOPICAL
  Administered 2015-01-30: 1 g via TOPICAL
  Administered 2015-01-31 – 2015-02-02 (×8): via TOPICAL
  Administered 2015-02-02: 1 g via TOPICAL
  Administered 2015-02-03 – 2015-02-05 (×7): via TOPICAL
  Administered 2015-02-05: 1 g via TOPICAL
  Administered 2015-02-06 – 2015-02-15 (×27): via TOPICAL
  Administered 2015-02-15 (×2): 1 g via TOPICAL
  Administered 2015-02-16 – 2015-02-19 (×9): via TOPICAL
  Administered 2015-02-19: 1 g via TOPICAL
  Administered 2015-02-19 – 2015-02-26 (×18): via TOPICAL
  Filled 2015-01-30 (×4): qty 15

## 2015-01-30 MED ORDER — LIDOCAINE HCL 2 % EX GEL
1.0000 "application " | Freq: Once | CUTANEOUS | Status: AC
  Administered 2015-01-30: 1 via URETHRAL
  Filled 2015-01-30: qty 5

## 2015-01-30 MED ORDER — INSULIN GLARGINE 100 UNIT/ML ~~LOC~~ SOLN
25.0000 [IU] | Freq: Every day | SUBCUTANEOUS | Status: DC
  Administered 2015-01-30 – 2015-02-01 (×3): 25 [IU] via SUBCUTANEOUS
  Filled 2015-01-30 (×3): qty 0.25

## 2015-01-30 MED ORDER — HEPARIN (PORCINE) IN NACL 100-0.45 UNIT/ML-% IJ SOLN
1300.0000 [IU]/h | INTRAMUSCULAR | Status: DC
  Administered 2015-01-30 – 2015-01-31 (×2): 1300 [IU]/h via INTRAVENOUS
  Filled 2015-01-30 (×2): qty 250

## 2015-01-30 MED ORDER — FUROSEMIDE 10 MG/ML IJ SOLN
120.0000 mg | Freq: Three times a day (TID) | INTRAVENOUS | Status: DC
  Administered 2015-01-30 – 2015-02-01 (×6): 120 mg via INTRAVENOUS
  Filled 2015-01-30 (×7): qty 12

## 2015-01-30 NOTE — Progress Notes (Signed)
Referring Physician(s): [facility]  Chief Complaint:  GB fossa abscess  Subjective:  GB fossa abscess drain placed 8/4 Interval bleed on 8/6 CT Fu CT 8/12- no change Flushing every shift per RN Flushes well No real output   Allergies: Review of patient's allergies indicates no known allergies.  Medications: Prior to Admission medications   Medication Sig Start Date End Date Taking? Authorizing Provider  allopurinol (ZYLOPRIM) 300 MG tablet Take 300 mg by mouth daily.   Yes Historical Provider, MD  amLODipine (NORVASC) 10 MG tablet Take 10 mg by mouth every morning.    Yes Historical Provider, MD  apixaban (ELIQUIS) 5 MG TABS tablet Take 1 tablet (5 mg total) by mouth 2 (two) times daily. 10/01/14  Yes April Palumbo, MD  aspirin 81 MG tablet Take 81 mg by mouth daily.   Yes Historical Provider, MD  atorvastatin (LIPITOR) 80 MG tablet Take 80 mg by mouth at bedtime.    Yes Historical Provider, MD  Cholecalciferol (VITAMIN D PO) Take 2,000 Units by mouth daily.    Yes Historical Provider, MD  citalopram (CELEXA) 40 MG tablet Take 40 mg by mouth every morning.    Yes Historical Provider, MD  furosemide (LASIX) 40 MG tablet Take 40 mg by mouth every morning.    Yes Historical Provider, MD  glucose 4 GM chewable tablet Chew 1 tablet by mouth as needed for low blood sugar (only if BS IS BELOW 70).   Yes Historical Provider, MD  insulin glargine (LANTUS) 100 UNIT/ML injection Inject 40 Units into the skin at bedtime.   Yes Historical Provider, MD  magnesium oxide (MAG-OX) 400 MG tablet Take 400 mg by mouth 2 (two) times daily.    Yes Historical Provider, MD  metFORMIN (GLUCOPHAGE) 500 MG tablet Take 500 mg by mouth 2 (two) times daily with a meal.   Yes Historical Provider, MD  oxyCODONE-acetaminophen (PERCOCET/ROXICET) 5-325 MG per tablet Take 1-2 tablets by mouth every 4 (four) hours as needed for moderate pain. 01/05/15  Yes [name], MD  pantoprazole (PROTONIX) 40 MG tablet Take 40 mg  by mouth daily.   Yes Historical Provider, MD  vitamin B-12 (CYANOCOBALAMIN) 500 MCG tablet Take 500 mcg by mouth daily.   Yes Historical Provider, MD  Liraglutide 18 MG/3ML SOPN Inject 1.2 mg into the skin daily. Pt states has been on hold since discharged 10-06-14 hospital visit    Historical Provider, MD  lisinopril (PRINIVIL,ZESTRIL) 40 MG tablet Take 40 mg by mouth 2 (two) times daily. On hold since 10-06-14    Historical Provider, MD  ondansetron (ZOFRAN) 8 MG tablet Take 8 mg by mouth 2 (two) times daily. Not taking-on hold form 10-06-14    Historical Provider, MD     Vital Signs: BP 153/72 mmHg  Pulse 97  Temp(Src) 98.9 F (37.2 C) (Oral)  Resp 28  Ht 5\' 6"  (1.676 m)  Wt 280 lb 13.9 oz (127.4 kg)  BMI 45.35 kg/m2  SpO2 97%  Physical Exam  Abdominal: Soft. He exhibits distension. There is tenderness.  Skin: Skin is warm.  RUQ abscess drain intact NT at site No bleeding No sign of infection Clean and dry Output bloody---small amt in JP     Imaging: Ct Abdomen Pelvis Wo Contrast  01/28/2015   CLINICAL DATA:  Evaluate gallbladder fossa abscess/hematoma.  EXAM: CT ABDOMEN AND PELVIS WITHOUT CONTRAST  TECHNIQUE: Multidetector CT imaging of the abdomen and pelvis was performed following the standard protocol without IV contrast.  COMPARISON:  01/22/2015  FINDINGS: There are small bilateral pleural effusions, right side greater the left. Again noted is volume loss and consolidation throughout the right lower lobe which is similar to the prior examination. There is mild atelectasis at the left lung base.  Again noted is a heterogeneous collection in the gallbladder fossa containing high-density material and gas. This gallbladder fossa collection is difficult to measure on the non contrast examination but roughly measures 10.0 x 9.8 x 9.8 cm and previously measured 10.2 x 9.6 x 10.0 cm. There continues to be a percutaneous drainage catheter within this gallbladder collection and this is  not changed in position.  Persistent mixed density collection along the posterior and inferior aspect of the liver. This is compatible with a hematoma. This collection measures 11.8 cm in transverse dimension and previously measured 11.4 cm. In addition, there appears to be high-density fluid collections along the anterior right hepatic lobe on sequence 2, image 29. These are most compatible with hematomas and the largest measures 3.8 x 5.3 cm, previously measured 4.1 x 4.9 cm. The perihepatic hematoma extends up to the right hemidiaphragm. There continues to be a moderate amount of ascites in the abdomen or pelvis which has not significantly changed.  There is a small amount of heterogeneous fluid along the lateral aspect of the spleen similar to the previous examination. No gross abnormality to the pancreas or adrenal glands. Small stones or calcifications in the left kidney lower pole without hydronephrosis. No acute abnormality to the right kidney.  Suspect a small hiatal hernia. No significant dilatation of small or large bowel.  There is diffuse subcutaneous edema throughout the abdomen and pelvis. Again noted is a right hip arthroplasty causing artifact in the pelvis. Multilevel degenerative changes in the thoracic and lumbar spine.  IMPRESSION: The gallbladder fossa hematoma/abscess has not significantly changed in size. Stable position of the percutaneous drainage catheter.  Stable appearance of the large perihepatic or subcapsular liver hematoma. Additional hematomas along the right anterior abdomen are stable. No significant change in the amount of ascites. Again noted is a small amount of perisplenic fluid.  Bilateral pleural effusions, right side greater the left. Stable volume loss and consolidation in the right lower lobe.   Electronically Signed   By: Richarda Overlie M.D.   On: 01/28/2015 07:23   Dg Chest Port 1 View  01/30/2015   CLINICAL DATA:  Dyspnea  EXAM: PORTABLE CHEST - 1 VIEW  COMPARISON:   01/21/2015  FINDINGS: Cardiomediastinal silhouette is stable. Right IJ central line with tip in right atrium again noted. No pulmonary edema. There is chronic elevation of the right hemidiaphragm with right basilar atelectasis.  IMPRESSION: Right IJ central line with tip in right atrium again noted. No pulmonary edema. There is chronic elevation of the right hemidiaphragm with right basilar atelectasis.   Electronically Signed   By: Natasha Mead M.D.   On: 01/30/2015 10:25    Labs:  CBC:  Recent Labs  01/27/15 0345  01/28/15 0320 01/28/15 1628 01/29/15 0517 01/29/15 1437 01/30/15 0440  WBC 12.7*  --  12.8*  --  12.5*  --  11.9*  HGB 8.0*  < > 7.6* 8.4* 7.5* 7.7* 7.3*  HCT 24.1*  < > 24.2* 25.3* 23.7* 23.3* 22.3*  PLT 327  --  325  --  342  --  336  < > = values in this interval not displayed.  COAGS:  Recent Labs  10/06/14 0445 01/19/15 1613 01/23/15 0754  INR  1.07 1.46 1.32  APTT 34 35 38*    BMP:  Recent Labs  01/27/15 0345 01/28/15 0320 01/29/15 0517 01/30/15 0440  NA 138 139 140 130*  K 3.6 3.4* 3.5 3.2*  CL 105 107 108 101  CO2 GLUCOSE 168* 131* 173* 389*  BUN 50* 50* 50* 46*  CALCIUM 8.3* 8.3* 8.5* 8.0*  CREATININE 3.51* 3.58* 3.69* 3.60*  GFRNONAA 16* 16* 16* 16*  GFRAA 19* 19* 18* 19*    LIVER FUNCTION TESTS:  Recent Labs  01/27/15 0345 01/28/15 0320 01/29/15 0517 01/30/15 0440  BILITOT 1.7* 1.7* 1.5* 1.3*  AST ALT 22 19 16* 14*  ALKPHOS 77 70 73 65  PROT 6.1* 6.0* 5.9* 5.6*  ALBUMIN 2.3* 2.2* 2.1* 2.1*    Assessment and Plan:  GB fossa abscess drain in place since 8/4 Bleed onCT 8/6 No change on CT 8/12 Minimal output; flushes well Will follow Plan per [facility]CS   Signed: Alvaretta Eisenberger A 01/30/2015, 10:57 AM   I spent a total of 15 Minutes at the the patient's bedside AND on the patient's hospital floor or unit, greater than 50% of which was counseling/coordinating care for abscess drain

## 2015-01-30 NOTE — Progress Notes (Signed)
[facility] PROGRESS NOTE  Larry [name] ZOX:[id number] DOB: [birth year] DOA: 01/19/2015  PCP: [name]  Brief HPI: 69 year old male w/ CAF on elliquis recently underwent lap chole on 7/14. Was discharged to home w/drain. Drain removed about a week before presentation to ER. Admitted directly from [facility] on 8/3 w/ septic shock which was likely due to infected hematoma in the GB fossa.   SIGNIFICANT EVENTS: Admitted 8/3 w/ abd pain and hypotension  8/4 hypotensive over-night. Went for [facility] drain. Found what looked like old infected hematoma. PCCM asked to see post-op for shock 8/5 off pressors. WOB worse.  8/6 WOB & wheezing slightly worse. Progressing abdominal pain 8/7 Hgb declining w/ increasing intraperit hemorrhage 8/7 Transfused 2u PRBCs 8/9 work of breathing still significant w/ marked upper airway component  8/10 breathing better. 1.6 liters negative. Slight bump in scr. Vanc stopped. Getting OOB for first time 8/11 eating regular diet. Up in chair. Feeling better.  8/13 no events outside of decreased urine output. Dr Sherene Sires started IV at 125/hr  Past medical history:  Past Medical History  Diagnosis Date  . Hypertension   . Atrial fibrillation 10-01-14  . Coronary artery disease   . Obesity   . Multiple fractures     history of -all over 20 yrs ago-"fell off cliff', "history vertebrae fractures"  . Cholecystostomy care     Cholecystostomy Tube RUQ of abdomen to drainage bag.  . Diabetes mellitus without complication     [facility] Dr. Randa Evens [phone]  . MI (myocardial infarction)     saw Dr. Carlene [facility] 12-16-14 Epic notes.    Antibiotics: Currently on IV Zosyn  Subjective: Patient denies significant pain in the upper abdomen. Denies nausea, vomiting. Feels weak overall. His sister is at bedside.  Objective: Vital Signs  Filed Vitals:   01/29/15 2348 01/30/15 0000 01/30/15 0400 01/30/15 0720  BP:   151/62 140/62   Pulse:  97 91 95  Temp: 98.1 F (36.7 C)     TempSrc: Oral     Resp:  23 52 30  Height:      Weight:      SpO2:  97% 97% 97%    Intake/Output Summary (Last 24 hours) at 01/30/15 0743 Last data filed at 01/30/15 0700  Gross per 24 hour  Intake   1975 ml  Output    520 ml  Net   1455 ml   Filed Weights   01/20/15 1320 01/27/15 0400 01/28/15 0323  Weight: 121.8 kg (268 lb 8.3 oz) 127.3 kg (280 lb 10.3 oz) 127.4 kg (280 lb 13.9 oz)    General appearance: alert, cooperative, appears stated age, no distress and moderately obese Resp: Diminished air entry at the bases. Few crackles but no wheezing. Cardio: regular rate and rhythm, S1, S2 normal, no murmur, click, rub or gallop GI: soft, non-tender; bowel sounds normal; no masses,  no organomegaly Extremities: 1+ pitting edema bilateral lower extremities. Neurologic: No focal neurological deficits appreciated.  Lab Results:  Basic Metabolic Panel:  Recent Labs Lab 01/26/15 0545 01/27/15 0345 01/28/15 0320 01/29/15 0517 01/30/15 0440  NA 140 138 139 140 130*  K 3.9 3.6 3.4* 3.5 3.2*  CL 107 105 107 108 101  CO2 25 26 24 26 24   GLUCOSE 133* 168* 131* 173* 389*  BUN 52* 50* 50* 50* 46*  CREATININE 3.57* 3.51* 3.58* 3.69* 3.60*  CALCIUM 8.2* 8.3* 8.3* 8.5* 8.0*  MG 1.9 2.0 1.8  1.8 1.7  PHOS 4.4 3.6 3.4 3.2 3.2   Liver Function Tests:  Recent Labs Lab 01/26/15 0545 01/27/15 0345 01/28/15 0320 01/29/15 0517 01/30/15 0440  AST 20 17 16 19 16   ALT 26 22 19  16* 14*  ALKPHOS 84 77 70 73 65  BILITOT 1.8* 1.7* 1.7* 1.5* 1.3*  PROT 5.8* 6.1* 6.0* 5.9* 5.6*  ALBUMIN 2.1* 2.3* 2.2* 2.1* 2.1*   CBC:  Recent Labs Lab 01/26/15 0545  01/27/15 0345  01/28/15 0320 01/28/15 1628 01/29/15 0517 01/29/15 1437 01/30/15 0440  WBC 13.1*  --  12.7*  --  12.8*  --  12.5*  --  11.9*  NEUTROABS 10.8*  --  10.4*  --  10.6*  --  10.4*  --  9.8*  HGB 7.6*  < > 8.0*  < > 7.6* 8.4* 7.5* 7.7* 7.3*  HCT 23.8*  < >  24.1*  < > 24.2* 25.3* 23.7* 23.3* 22.3*  MCV 89.5  --  90.3  --  89.6  --  90.5  --  91.0  PLT 317  --  327  --  325  --  342  --  336  < > = values in this interval not displayed.   CBG:  Recent Labs Lab 01/29/15 1143 01/29/15 1551 01/29/15 2007 01/29/15 2346 01/30/15 0442  GLUCAP 203* 237* 210* 219* 274*    Recent Results (from the past 240 hour(s))  Culture, routine-abscess     Status: None   Collection Time: 01/20/15  1:00 PM  Result Value Ref Range Status   Specimen Description ABSCESS GALL BLADDER FOSSA  Final   Special Requests Normal  Final   Gram Stain   Final    ABUNDANT WBC PRESENT,BOTH PMN AND MONONUCLEAR NO SQUAMOUS EPITHELIAL CELLS SEEN NO ORGANISMS SEEN Performed at [facility]    Culture   Final    MULTIPLE ORGANISMS PRESENT, NONE PREDOMINANT Note: NO STAPHYLOCOCCUS AUREUS ISOLATED NO GROUP A STREP (S.PYOGENES) ISOLATED Performed at [facility]    Report Status 01/23/2015 FINAL  Final  Anaerobic culture     Status: None   Collection Time: 01/20/15  1:00 PM  Result Value Ref Range Status   Specimen Description ABSCESS GALL BLADDER FOSSA  Final   Special Requests Normal  Final   Gram Stain   Final    ABUNDANT WBC PRESENT,BOTH PMN AND MONONUCLEAR NO SQUAMOUS EPITHELIAL CELLS SEEN NO ORGANISMS SEEN Performed at [facility]    Culture   Final    NO ANAEROBES ISOLATED Performed at [facility]    Report Status 01/26/2015 FINAL  Final  MRSA PCR Screening     Status: None   Collection Time: 01/20/15  1:39 PM  Result Value Ref Range Status   MRSA by PCR NEGATIVE NEGATIVE Final    Comment:        The GeneXpert MRSA Assay (FDA approved for NASAL specimens only), is one component of a comprehensive MRSA colonization surveillance program. It is not intended to diagnose MRSA infection nor to guide or monitor treatment for MRSA infections. Performed at [facility]   Culture, blood (routine x 2)      Status: None   Collection Time: 01/20/15  1:50 PM  Result Value Ref Range Status   Specimen Description BLOOD RIGHT ARM  Final   Special Requests IN PEDIATRIC BOTTLE 4CC  Final   Culture   Final    NO GROWTH 5 DAYS Performed at [facility]  Report Status 01/25/2015 FINAL  Final  Culture, blood (routine x 2)     Status: None   Collection Time: 01/20/15  1:55 PM  Result Value Ref Range Status   Specimen Description BLOOD RIGHT HAND  Final   Special Requests IN PEDIATRIC BOTTLE 4CC  Final   Culture   Final    NO GROWTH 5 DAYS Performed at [facility]    Report Status 01/25/2015 FINAL  Final      Studies/Results: No results found.  Medications:  Scheduled: . antiseptic oral rinse  7 mL Mouth Rinse BID  . diphenhydrAMINE  12.5 mg Intravenous Once  . furosemide  120 mg Intravenous 3 times per day  . insulin aspart  0-15 Units Subcutaneous 6 times per day  . insulin glargine  15 Units Subcutaneous QHS  . lidocaine  1 application Urethral Once  . ondansetron  8 mg Oral BID  . piperacillin-tazobactam (ZOSYN)  IV  2.25 g Intravenous Q8H   Continuous: . heparin 1,100 Units/hr (01/30/15 1000)   ZOX:WRUEAVWU (SUBLIMAZE) injection, levalbuterol, LORazepam, ondansetron **OR** ondansetron (ZOFRAN) IV  Assessment/Plan:  Principal Problem:   Shock circulatory Active Problems:   Diabetes mellitus without complication   HTN (hypertension)   Anemia   Paroxysmal atrial fibrillation   Obesity   Intra-abdominal abscess   Paroxysmal a-fib   Sepsis   Acute respiratory distress   Intra-abdominal hematoma    Acute hypoxic respiratory failure Patient has chronically elevated right hemidiaphragm. He has evolving bilateral pleural effusions noted on CT abdomen. Chest x-ray from today did not show any pulmonary edema. I still think patient is somewhat volume overloaded. He may benefit from diuresis. Please see below. Continue oxygen in the interim.  Acute on chronic  kidney failure Baseline creatinine appears to be around 1.5. Patient was critically ill with septic shock and hypotension. Creatinine is 3.6. He has poor urine output. He was started on IV fluids overnight. I think he is volume overloaded and would benefit from diuresis. I have discussed with nephrologist, Dr. Arlean Hopping. He will evaluate the patient today.  Septic shock secondary to complicated cholecystitis status post cholecystectomy with infected hematoma He was on pressors previously. Blood pressure has stabilized. Continue to monitor in stepdown for now.  History of atrial fibrillation Currently in sinus rhythm. Was previously on anticoagulation with Eliquis. Cleared by general surgery to reinitiate anticoagulation. For now, we will start him on IV heparin and monitor closely for reoccurrence of bleeding. Because of renal failure he cannot be on any of the new anti-coagulantss. We will have to consider warfarin unless his renal function improves.  Infected hematoma status post cholecystectomy Has CT-guided percutaneous drain placed by IR. General surgery is following and managing. Continues to be on Zosyn. Cultures did not show any specific organism. Defer to general surgery to de-escalate antibiotics.  Acute blood loss anemia Secondary to hematoma after gallbladder surgery. Hemoglobin has been stable. Continue to monitor closely while he is on IV heparin.  History of type 2 diabetes mellitus Currently on Lantus and sliding scale coverage. Blood sugars are poorly controlled. We will increase the dose of his Lantus. HbA1c 8.2 back in April.   DVT Prophylaxis: IV heparin to be started today Code Status: Full code  Family Communication: Discussed with the patient and his sister  Disposition Plan: Continue to remain in step down.     LOS: 11 days   [facility]  [facility] Pager [phone] 01/30/2015, 7:43 AM  If 7PM-7AM, please contact night-coverage  at [email or web address],  password [facility]

## 2015-01-30 NOTE — Progress Notes (Signed)
Urinalysis returned with [facility] wbc's/rbc's and ++ for yeast / bacteria.  This is likely reflecting bacterial and/or fungal UTI.  Will have urine culture done. Is getting zosyn now. Will d/w primary MD.   Vinson Moselle MD (pgr) [phone]    [phone] 01/30/2015, 3:08 PM

## 2015-01-30 NOTE — Progress Notes (Signed)
Patient is in Sinus rhythm.  Notified Dr. Rito Ehrlich of cardiac rhythm before starting heparin drip.

## 2015-01-30 NOTE — Progress Notes (Signed)
Occupational Therapy Evaluation Patient Details Name: Larry [name] MRN: [id number] DOB: 09/15/1945 Today's Date: 01/30/2015    History of Present Illness 69 year old male w/ CAF on elliquis recently underwent lap chole on 7/14. Was discharged to home w/drain. Drain removed about a week before presentation to ER. Admitted directly  from [facility] on 8/3 w/ septic shock which was likely due to infected hematoma in the GB fossa.  new perc drain placed on 8/4, when he remained hypotensive   Clinical Impression   Patient presents to OT with decreased ADL independence due to the functional deficits listed below. He will benefit from skilled OT to maximize independence and safety. OT will follow.    Follow Up Recommendations  SNF;Supervision/Assistance - 24 hour    Equipment Recommendations  Other (comment) (to be determined)    Recommendations for Other Services       Precautions / Restrictions Precautions Precautions: Fall Precaution Comments: on O2. Restrictions Weight Bearing Restrictions: No      Mobility Bed Mobility Overal bed mobility: Needs Assistance Bed Mobility: Sit to Supine       Sit to supine: Mod assist;+2 for physical assistance;+2 for safety/equipment;HOB elevated   General bed mobility comments: Assist for trunk and bil LEs. Increased time and multiple rest breaks taken/needed.   Transfers Overall transfer level: Needs assistance Equipment used: Rolling walker (2 wheeled) Transfers: Sit to/from Stand Sit to Stand: From elevated surface;Min assist;+2 safety/equipment         General transfer comment: Assist to rise, stabilize, control descent. VCs safety, hand placement. Some difficulty maintain grip on walker.     Balance           Standing balance support: Bilateral upper extremity supported;During functional activity Standing balance-Leahy Scale: Poor                              ADL Overall ADL's : Needs  assistance/impaired Eating/Feeding: Maximal assistance;Total assistance;Bed level Eating/Feeding Details (indicate cue type and reason): B hand edema; has difficulty holding utensils and cup Grooming: Maximal assistance;Total assistance Grooming Details (indicate cue type and reason): BUE edema; has difficulty holding grooming containers/utensils Upper Body Bathing: Total assistance   Lower Body Bathing: Total assistance   Upper Body Dressing : Total assistance   Lower Body Dressing: Total assistance               Functional mobility during ADLs: Minimal assistance;Moderate assistance;+2 for physical assistance;+2 for safety/equipment;Rolling walker General ADL Comments: Patient agreeable to working with PT/OT. His sister was present for most of the session. Patient performed bed mobility mod A +2, sit to stand min A +2, amb about 6 feet with min A +2 and RW with chair follow. Sat in recliner at end of session. Patient has edema x 4 extremities and scrotum. Scrotal area very sore. Unable to use hands functionally very well due to edema. Sister has been feeding patient. May benefit from built up foam. Also has L elbow pain and unable to flex more than 80 degrees without extreme pain. Encouraged patient to move arms and legs as much as possible in bed and chair. Nurse reports patient has used BSC x 1 with nursing. He said he did not need to use it during our session.      Vision     Perception     Praxis      Pertinent Vitals/Pain Pain Assessment: Faces Faces Pain  Scale: Hurts even more Pain Location: scrotum, generalized pain Pain Descriptors / Indicators: Grimacing;Sore Pain Intervention(s): Limited activity within patient's tolerance     Hand Dominance Right   Extremity/Trunk Assessment Upper Extremity Assessment Upper Extremity Assessment: Generalized weakness;LUE deficits/detail LUE Deficits / Details: pain with L elbow flexion, can only get it to about 80 degrees of  flexion before patient unable to tolerate. Patient also has difficulty using L arm for functional mobility and ADLs due to pain.   Lower Extremity Assessment Lower Extremity Assessment: Defer to PT evaluation       Communication Communication Communication: No difficulties   Cognition Arousal/Alertness: Awake/alert Behavior During Therapy: WFL for tasks assessed/performed Overall Cognitive Status: Within Functional Limits for tasks assessed                     General Comments       Exercises       Shoulder Instructions      Home Living Family/patient expects to be discharged to:: Unsure Living Arrangements: Spouse/significant other Available Help at Discharge: Family Type of Home: House Home Access: Level entry     Home Layout: One level                   Additional Comments: unsure of how much assistance is available to patient at home      Prior Functioning/Environment          Comments: uncertain, was at home after recent DC from hospital, PLOF unknown    OT Diagnosis: Generalized weakness   OT Problem List: Decreased strength;Decreased range of motion;Decreased activity tolerance;Impaired balance (sitting and/or standing);Decreased knowledge of use of DME or AE;Cardiopulmonary status limiting activity;Pain;Impaired UE functional use;Increased edema   OT Treatment/Interventions: Self-care/ADL training;Therapeutic exercise;DME and/or AE instruction;Therapeutic activities;Patient/family education    OT Goals(Current goals can be found in the care plan section) Acute Rehab OT Goals OT Goal Formulation: With patient Time For Goal Achievement: 02/13/15 Potential to Achieve Goals: Good  OT Frequency: Min 2X/week   Barriers to D/C:            Co-evaluation PT/OT/SLP Co-Evaluation/Treatment: Yes Reason for Co-Treatment: Complexity of the patient's impairments (multi-system involvement);For patient/therapist safety PT goals addressed during  session: Mobility/safety with mobility OT goals addressed during session: Strengthening/ROM;Proper use of Adaptive equipment and DME      End of Session Equipment Utilized During Treatment: Rolling walker;Oxygen Nurse Communication: Mobility status  Activity Tolerance: Patient tolerated treatment well Patient left: in chair;with call bell/phone within reach;with family/visitor present   Time: 1610-9604 OT Time Calculation (min): 34 min Charges:  OT General Charges $OT Visit: 1 Procedure OT Evaluation $Initial OT Evaluation Tier I: 1 Procedure G-Codes:    Markee Remlinger A 02-03-15, 11:26 AM

## 2015-01-30 NOTE — Consult Note (Signed)
Renal Service Consult Note [facility]  KORBY RATAY 01/30/2015 Maree Krabbe Requesting Physician:  Dr Barnie Del  Reason for Consult:  Acute renal failure HPI: The patient is a 69 y.o. year-old with hx of CAD, HTN, afib, DM/ MI who had acute cholecystitis in April this year rx'd w perc chole tube. Then had lap cholecystectomy on 01/04/15.  He became ill with abd pain, dizziness, fevers, and gen weakness and was admitted on 8/4 w CT showing fluid collection/ abcess in the GB fossa.  IR did aspiration of fossa fluid w drain insertion, suspected infected hematoma. Patient was in shock for the first 72hrs but then BP improved. Creat was up on admission at 2.31 then peaked at 3.60 but has not improved.  Asked to see for AKI. Baseline creat was 1.1- 1.5 in April 2016.    UA on 8/3 - SG > 1.046, neg protein, no cells CXR 8/14 - raised R hemidiaphragm, no gross edema Vanc level 22 on 8/8 Abd CT x 2 , no contrast given Abd CT on 7/28 (1 week prior to admission) did get IV contrast  Meds this admission > lasix, insulin, zofran, ativan, protonix, zosyn (8/3 - current), IV vanc (8/3- 8/8)   Past Medical History  Past Medical History  Diagnosis Date  . Hypertension   . Atrial fibrillation 10-01-14  . Coronary artery disease   . Obesity   . Multiple fractures     history of -all over 20 yrs ago-"fell off cliff', "history vertebrae fractures"  . Cholecystostomy care     Cholecystostomy Tube RUQ of abdomen to drainage bag.  . Diabetes mellitus without complication     [facility] Dr. Randa Evens [phone]  . MI (myocardial infarction)     saw Dr. Carlene [facility] 12-16-14 Epic notes.   Past Surgical History  Past Surgical History  Procedure Laterality Date  . Revision total hip arthroplasty Left   . Knee replacement Left   . Cardiac stent    . Cardiac catheterization      with stent placement in 2014  . Ankle surgery Left     left foot -retained  hardware  . Joint replacement      LTHA, LTKA  . Cholecystectomy N/A 01/04/2015    Procedure: LAPAROSCOPIC CHOLECYSTECTOMY WITH CHOLANGIOGRAM;  Surgeon: [name], MD;  Location: [facility];  Service: General;  Laterality: N/A;   Family History  Family History  Problem Relation Age of Onset  . Heart disease Father     90 yrs deceased  . Heart failure Father   . Heart attack Father   . Diabetes Sister   . Diabetes Son   . Diabetes Daughter    Social History  reports that he has never smoked. He does not have any smokeless tobacco history on file. He reports that he does not drink alcohol or use illicit drugs. Allergies No Known Allergies Home medications Prior to Admission medications   Medication Sig Start Date End Date Taking? Authorizing Provider  allopurinol (ZYLOPRIM) 300 MG tablet Take 300 mg by mouth daily.   Yes Historical Provider, MD  amLODipine (NORVASC) 10 MG tablet Take 10 mg by mouth every morning.    Yes Historical Provider, MD  apixaban (ELIQUIS) 5 MG TABS tablet Take 1 tablet (5 mg total) by mouth 2 (two) times daily. 10/01/14  Yes April Palumbo, MD  aspirin 81 MG tablet Take 81 mg by mouth daily.   Yes Historical Provider, MD  atorvastatin (LIPITOR) 80  MG tablet Take 80 mg by mouth at bedtime.    Yes Historical Provider, MD  Cholecalciferol (VITAMIN D PO) Take 2,000 Units by mouth daily.    Yes Historical Provider, MD  citalopram (CELEXA) 40 MG tablet Take 40 mg by mouth every morning.    Yes Historical Provider, MD  furosemide (LASIX) 40 MG tablet Take 40 mg by mouth every morning.    Yes Historical Provider, MD  glucose 4 GM chewable tablet Chew 1 tablet by mouth as needed for low blood sugar (only if BS IS BELOW 70).   Yes Historical Provider, MD  insulin glargine (LANTUS) 100 UNIT/ML injection Inject 40 Units into the skin at bedtime.   Yes Historical Provider, MD  magnesium oxide (MAG-OX) 400 MG tablet Take 400 mg by mouth 2 (two) times daily.    Yes Historical  Provider, MD  metFORMIN (GLUCOPHAGE) 500 MG tablet Take 500 mg by mouth 2 (two) times daily with a meal.   Yes Historical Provider, MD  oxyCODONE-acetaminophen (PERCOCET/ROXICET) 5-325 MG per tablet Take 1-2 tablets by mouth every 4 (four) hours as needed for moderate pain. 01/05/15  Yes [name], MD  pantoprazole (PROTONIX) 40 MG tablet Take 40 mg by mouth daily.   Yes Historical Provider, MD  vitamin B-12 (CYANOCOBALAMIN) 500 MCG tablet Take 500 mcg by mouth daily.   Yes Historical Provider, MD  Liraglutide 18 MG/3ML SOPN Inject 1.2 mg into the skin daily. Pt states has been on hold since discharged 10-06-14 hospital visit    Historical Provider, MD  lisinopril (PRINIVIL,ZESTRIL) 40 MG tablet Take 40 mg by mouth 2 (two) times daily. On hold since 10-06-14    Historical Provider, MD  ondansetron (ZOFRAN) 8 MG tablet Take 8 mg by mouth 2 (two) times daily. Not taking-on hold form 10-06-14    Historical Provider, MD   Liver Function Tests  Recent Labs Lab 01/28/15 0320 01/29/15 0517 01/30/15 0440  AST ALT 19 16* 14*  ALKPHOS 70 73 65  BILITOT 1.7* 1.5* 1.3*  PROT 6.0* 5.9* 5.6*  ALBUMIN 2.2* 2.1* 2.1*   No results for input(s): LIPASE, AMYLASE in the last 168 hours. CBC  Recent Labs Lab 01/28/15 0320  01/29/15 0517 01/29/15 1437 01/30/15 0440  WBC 12.8*  --  12.5*  --  11.9*  NEUTROABS 10.6*  --  10.4*  --  9.8*  HGB 7.6*  < > 7.5* 7.7* 7.3*  HCT 24.2*  < > 23.7* 23.3* 22.3*  MCV 89.6  --  90.5  --  91.0  PLT 325  --  342  --  336  < > = values in this interval not displayed. Basic Metabolic Panel  Recent Labs Lab 01/24/15 0425 01/25/15 0440 01/26/15 0545 01/27/15 0345 01/28/15 0320 01/29/15 0517 01/30/15 0440  NA 137 138 140 138 139 140 130*  K 4.0 4.1 3.9 3.6 3.4* 3.5 3.2*  CL 106 106 107 105 107 108 101  CO2 GLUCOSE 191* 192* 133* 168* 131* 173* 389*  BUN 49* 50* 52* 50* 50* 50* 46*  CREATININE 3.17* 3.32* 3.57* 3.51* 3.58*  3.69* 3.60*  CALCIUM 8.1* 8.0* 8.2* 8.3* 8.3* 8.5* 8.0*  PHOS 4.5 4.4 4.4 3.6 3.4 3.2 3.2    Filed Vitals:   01/30/15 0400 01/30/15 0720 01/30/15 0749 01/30/15 0800  BP: 140/62   153/72  Pulse: 91 95  96  Temp:   98.9 F (37.2 C)  TempSrc:   Oral   Resp: 52 30  37  Height:      Weight:      SpO2: 97% 97%  96%   Exam Alert, no distress No rash, cyanosis or gangrene Sclera anicteric, throat clear No jvd Chest faint crackles R base. L clear RRR no MRG Abd soft ntnd markedly obese +BS RUQ drain in place Scrotal / penile edema 2+ LE edema 2-3+ bilat throughout the LE's No UE edema No wounds or ulcers Neuro is fully alert and Ox 3, nonfocal  UA on 8/3 - SG > 1.046, neg protein, no cells CXR 8/14 - raised R hemidiaphragm, no gross edema Vanc level 22 on 8/8 Abd CT x 2 , no contrast given Abd CT on 7/28 (1 week prior to admission) did get IV contrast  Assessment: 1. Acute renal failure - unclear cause, creat stuck mid-3's. Suspect ATN due to septic shock at time of admission. Could have AIN from meds (zosyn, PPI). UOP marginal, weights up 20 lbs from admit. Agree with diuresis. Have ordered IV lasix. Repeat UA, get UNA/ creat, renal US, place foley. DC protonix, consider dc abx.  2. GB fossa abcess, s/p IR drain placement - cx's were negative. ON zosyn.  3. Obesity 4. Volume overload - no evidence pulm edema yet 5. DM 2 6. Hx MI / cardiac stent (at outside facility 2years ago). EF wnl by echo.    Plan- as above.   Vinson Moselle MD (pgr) [phone]    [phone] 01/30/2015, 9:10 AM

## 2015-01-30 NOTE — Progress Notes (Signed)
Right, lateral abdomen has increased distention, tautness and edema.  Called [facility] answering service to notify Dr. Romie Levee.
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
## 2015-01-30 NOTE — Progress Notes (Signed)
Physical Therapy Treatment Patient Details Name: Larry [name] MRN: [id number] DOB: 02-19-1946 Today's Date: 01/30/2015    History of Present Illness 69 year old male w/ CAF on elliquis recently underwent lap chole on 7/14. Was discharged to home w/drain. Drain removed about a week before presentation to ER. Admitted directly  from [facility] on 8/3 w/ septic shock which was likely due to infected hematoma in the GB fossa.  new perc drain placed on 8/4, when he remained hypotensive    PT Comments    Progressing slowly with mobility. Mod encouragement for progression of activity. Pt fatigues very easily. Feel ST rehab may be warranted.   Follow Up Recommendations  SNF;Supervision/Assistance - 24 hour     Equipment Recommendations  Rolling walker with 5" wheels    Recommendations for Other Services       Precautions / Restrictions Precautions Precautions: Fall Precaution Comments: on O2. Restrictions Weight Bearing Restrictions: No    Mobility  Bed Mobility Overal bed mobility: Needs Assistance Bed Mobility: Sit to Supine       Sit to supine: Mod assist;+2 for physical assistance;+2 for safety/equipment;HOB elevated   General bed mobility comments: Assist for trunk and bil LEs. Increased time and multiple rest breaks taken/needed.   Transfers Overall transfer level: Needs assistance Equipment used: Rolling walker (2 wheeled) Transfers: Sit to/from Stand Sit to Stand: From elevated surface;Min assist;+2 safety/equipment         General transfer comment: Assist to rise, stabilize, control descent. VCs safety, hand placement. Some difficulty maintain grip on walker.   Ambulation/Gait Ambulation/Gait assistance: Min assist;+2 physical assistance;+2 safety/equipment Ambulation Distance (Feet): 6 Feet Assistive device: Rolling walker (2 wheeled) Gait Pattern/deviations: Decreased step length - right;Decreased step length - left;Trunk flexed;Decreased stride  length     General Gait Details: Assist to stabilize pt and maneuver with RW. Followed closely with recliner. Fatigues very quickly. Removed O2 for brief walk-sats maintained >90%   Stairs            Wheelchair Mobility    Modified Rankin (Stroke Patients Only)       Balance           Standing balance support: Bilateral upper extremity supported;During functional activity Standing balance-Leahy Scale: Poor                      Cognition Arousal/Alertness: Awake/alert Behavior During Therapy: WFL for tasks assessed/performed Overall Cognitive Status: Within Functional Limits for tasks assessed                      Exercises      General Comments        Pertinent Vitals/Pain Pain Assessment: Faces Faces Pain Scale: Hurts whole lot Pain Location: scotum, generallized pain Pain Descriptors / Indicators: Sore;Grimacing;Guarding Pain Intervention(s): Limited activity within patient's tolerance;Repositioned    Home Living Family/patient expects to be discharged to:: Unsure                    Prior Function            PT Goals (current goals can now be found in the care plan section) Progress towards PT goals: Progressing toward goals (slowly)    Frequency  Min 3X/week    PT Plan Current plan remains appropriate    Co-evaluation   Reason for Co-Treatment: Complexity of the patient's impairments (multi-system involvement);For patient/therapist safety PT goals addressed during session: Mobility/safety with mobility  OT goals addressed during session: Strengthening/ROM;Proper use of Adaptive equipment and DME     End of Session Equipment Utilized During Treatment: Oxygen Activity Tolerance: Patient limited by fatigue;Patient limited by pain Patient left: in chair;with call bell/phone within reach;with family/visitor present     Time: 9562-1308 PT Time Calculation (min) (ACUTE ONLY): 33 min  Charges:  $Gait Training: 8-22  mins                    G Codes:      Rebeca Alert, MPT Pager: [phone]
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
## 2015-01-30 NOTE — Progress Notes (Signed)
Patient ID: Larry [name], male   DOB: 05/16/1946, 69 y.o.   MRN: [id number]  General Surgery - [facility]  HD#: 12  Subjective:   Denies abdominal pain, no nausea but also no appetite, poor PO intake.  Having bowel function.  Low UOP overnight, IVF's restarted.  Difficulty with ambulation due to edema, only moving from bed to chair  Objective: Vital signs in last 24 hours: Temp:  [98.1 F (36.7 C)-99.1 F (37.3 C)] 98.1 F (36.7 C) (08/13 2348) Pulse Rate:  [91-101] 95 (08/14 0720) Resp:  [20-52] 30 (08/14 0720) BP: (133-165)/(57-63) 140/62 mmHg (08/14 0400) SpO2:  [91 %-97 %] 97 % (08/14 0720) Last BM Date: 01/29/15  Intake/Output from previous day: 08/13 0701 - 08/14 0700 In: 1975 [P.O.:120; I.V.:1665; IV Piggyback:150] Out: 520 [Urine:520] Intake/Output this shift:    Physical Exam: HEENT - sclerae clear, mucous membranes moist Neck - soft, IJ on right Chest - some wheezing noted Cor - tachy Abdomen - soft, BS present; wounds dry and intact; JP with dark red small output Ext: 2+ pitting edema, all 4 extremities  Lab Results:   Recent Labs  01/29/15 0517 01/29/15 1437 01/30/15 0440  WBC 12.5*  --  11.9*  HGB 7.5* 7.7* 7.3*  HCT 23.7* 23.3* 22.3*  PLT 342  --  336   BMET  Recent Labs  01/29/15 0517 01/30/15 0440  NA 140 130*  K 3.5 3.2*  CL 108 101  CO2 26 24  GLUCOSE 173* 389*  BUN 50* 46*  CREATININE 3.69* 3.60*  CALCIUM 8.5* 8.0*   PT/INR No results for input(s): LABPROT, INR in the last 72 hours. Comprehensive Metabolic Panel:    Component Value Date/Time   NA 130* 01/30/2015 0440   NA 140 01/29/2015 0517   K 3.2* 01/30/2015 0440   K 3.5 01/29/2015 0517   CL 101 01/30/2015 0440   CL 108 01/29/2015 0517   CO2 24 01/30/2015 0440   CO2 26 01/29/2015 0517   BUN 46* 01/30/2015 0440   BUN 50* 01/29/2015 0517   CREATININE 3.60* 01/30/2015 0440   CREATININE 3.69* 01/29/2015 0517   GLUCOSE 389* 01/30/2015 0440   GLUCOSE  173* 01/29/2015 0517   CALCIUM 8.0* 01/30/2015 0440   CALCIUM 8.5* 01/29/2015 0517   AST 16 01/30/2015 0440   AST 19 01/29/2015 0517   ALT 14* 01/30/2015 0440   ALT 16* 01/29/2015 0517   ALKPHOS 65 01/30/2015 0440   ALKPHOS 73 01/29/2015 0517   BILITOT 1.3* 01/30/2015 0440   BILITOT 1.5* 01/29/2015 0517   PROT 5.6* 01/30/2015 0440   PROT 5.9* 01/29/2015 0517   ALBUMIN 2.1* 01/30/2015 0440   ALBUMIN 2.1* 01/29/2015 0517    Studies/Results: No results found.  Anti-infectives: Anti-infectives    Start     Dose/Rate Route Frequency Ordered Stop   01/29/15 1700  piperacillin-tazobactam (ZOSYN) IVPB 2.25 g     2.25 g 100 mL/hr over 30 Minutes Intravenous Every 8 hours 01/29/15 0845     01/24/15 2200  vancomycin (VANCOCIN) IVPB 1000 mg/200 mL premix     1,000 mg 200 mL/hr over 60 Minutes Intravenous  Once 01/24/15 1349 01/24/15 2301   01/21/15 2000  vancomycin (VANCOCIN) 1,500 mg in sodium chloride 0.9 % 500 mL IVPB  Status:  Discontinued     1,500 mg 250 mL/hr over 120 Minutes Intravenous Every 24 hours 01/21/15 1048 01/23/15 1726   01/20/15 2000  vancomycin (VANCOCIN) 1,250 mg in sodium chloride 0.9 %  250 mL IVPB  Status:  Discontinued     1,250 mg 166.7 mL/hr over 90 Minutes Intravenous Every 24 hours 01/19/15 1945 01/20/15 0843   01/20/15 2000  vancomycin (VANCOCIN) 1,250 mg in sodium chloride 0.9 % 250 mL IVPB  Status:  Discontinued     1,250 mg 166.7 mL/hr over 90 Minutes Intravenous Every 24 hours 01/20/15 1519 01/21/15 1048   01/20/15 0000  piperacillin-tazobactam (ZOSYN) IVPB 3.375 g  Status:  Discontinued     3.375 g 12.5 mL/hr over 240 Minutes Intravenous Every 8 hours 01/19/15 1946 01/29/15 0845   01/19/15 1715  vancomycin (VANCOCIN) 2,500 mg in sodium chloride 0.9 % 500 mL IVPB     2,500 mg 250 mL/hr over 120 Minutes Intravenous  Once 01/19/15 1707 01/19/15 2159   01/19/15 1715  piperacillin-tazobactam (ZOSYN) IVPB 3.375 g     3.375 g 100 mL/hr over 30 Minutes  Intravenous  Once 01/19/15 1707 01/19/15 1900      Assessment & Plans: 1. Status post lap chole with post op abscess Perc drain in place with limited drain output Post procedure hemorrhage - stable IV Zosyn: cultures show multiple organisms  2. Acute blood loss anemia Hgb stabilizing at 7- 7.5 3. Acute on chronic renal insufficiency Creatinine stabilizing at ~3.5, seems fluid overloaded.  CCM to ask Nephrology to evaluate 4. Respiratory failure / dyspnea stable 5. Atrial fibrillation Consider restarting anticoagulation - per medical service - OK from surgical standpoint 6. Needs PT and OT eval  CCM back on board due to tenuous status and low UOP.  [facility] to assume medical management once pt more stable.  Appreciate CCM assistance with this complex patient.    Ashonte Angelucci C. 01/30/2015

## 2015-01-30 NOTE — Progress Notes (Signed)
H&H ordered by Dr. Johna Sheriff, CCS.

## 2015-01-31 DIAGNOSIS — N179 Acute kidney failure, unspecified: Secondary | ICD-10-CM | POA: Diagnosis present

## 2015-01-31 DIAGNOSIS — N390 Urinary tract infection, site not specified: Secondary | ICD-10-CM | POA: Diagnosis present

## 2015-01-31 DIAGNOSIS — N189 Chronic kidney disease, unspecified: Secondary | ICD-10-CM | POA: Diagnosis present

## 2015-01-31 DIAGNOSIS — D649 Anemia, unspecified: Secondary | ICD-10-CM

## 2015-01-31 DIAGNOSIS — T83511A Infection and inflammatory reaction due to indwelling urethral catheter, initial encounter: Secondary | ICD-10-CM

## 2015-01-31 DIAGNOSIS — D62 Acute posthemorrhagic anemia: Secondary | ICD-10-CM | POA: Diagnosis present

## 2015-01-31 DIAGNOSIS — T8351XA Infection and inflammatory reaction due to indwelling urinary catheter, initial encounter: Secondary | ICD-10-CM

## 2015-01-31 LAB — CBC WITH DIFFERENTIAL/PLATELET
BASOS ABS: 0 10*3/uL (ref 0.0–0.1)
BASOS PCT: 0 % (ref 0–1)
Eosinophils Absolute: 0.1 10*3/uL (ref 0.0–0.7)
Eosinophils Relative: 1 % (ref 0–5)
HEMATOCRIT: 23.8 % — AB (ref 39.0–52.0)
Hemoglobin: 7.6 g/dL — ABNORMAL LOW (ref 13.0–17.0)
Lymphocytes Relative: 5 % — ABNORMAL LOW (ref 12–46)
Lymphs Abs: 0.6 10*3/uL — ABNORMAL LOW (ref 0.7–4.0)
MCH: 28.6 pg (ref 26.0–34.0)
MCHC: 31.9 g/dL (ref 30.0–36.0)
MCV: 89.5 fL (ref 78.0–100.0)
MONO ABS: 1.2 10*3/uL — AB (ref 0.1–1.0)
Monocytes Relative: 9 % (ref 3–12)
NEUTROS ABS: 11.5 10*3/uL — AB (ref 1.7–7.7)
NEUTROS PCT: 85 % — AB (ref 43–77)
Platelets: 338 10*3/uL (ref 150–400)
RBC: 2.66 MIL/uL — ABNORMAL LOW (ref 4.22–5.81)
RDW: 16.3 % — AB (ref 11.5–15.5)
WBC: 13.4 10*3/uL — AB (ref 4.0–10.5)

## 2015-01-31 LAB — GLUCOSE, CAPILLARY
GLUCOSE-CAPILLARY: 168 mg/dL — AB (ref 65–99)
GLUCOSE-CAPILLARY: 208 mg/dL — AB (ref 65–99)
GLUCOSE-CAPILLARY: 229 mg/dL — AB (ref 65–99)
Glucose-Capillary: 151 mg/dL — ABNORMAL HIGH (ref 65–99)
Glucose-Capillary: 161 mg/dL — ABNORMAL HIGH (ref 65–99)
Glucose-Capillary: 219 mg/dL — ABNORMAL HIGH (ref 65–99)

## 2015-01-31 LAB — BASIC METABOLIC PANEL
ANION GAP: 8 (ref 5–15)
BUN: 48 mg/dL — ABNORMAL HIGH (ref 6–20)
CALCIUM: 8.4 mg/dL — AB (ref 8.9–10.3)
CO2: 26 mmol/L (ref 22–32)
Chloride: 103 mmol/L (ref 101–111)
Creatinine, Ser: 3.8 mg/dL — ABNORMAL HIGH (ref 0.61–1.24)
GFR, EST AFRICAN AMERICAN: 17 mL/min — AB (ref >60)
GFR, EST NON AFRICAN AMERICAN: 15 mL/min — AB (ref >60)
Glucose, Bld: 170 mg/dL — ABNORMAL HIGH (ref 65–99)
Potassium: 3.2 mmol/L — ABNORMAL LOW (ref 3.5–5.1)
Sodium: 137 mmol/L (ref 135–145)

## 2015-01-31 LAB — HEPARIN LEVEL (UNFRACTIONATED): Heparin Unfractionated: <0.1 IU/mL — ABNORMAL LOW (ref 0.30–0.70)

## 2015-01-31 LAB — PHOSPHORUS: PHOSPHORUS: 3.8 mg/dL (ref 2.5–4.6)

## 2015-01-31 LAB — MAGNESIUM: MAGNESIUM: 1.6 mg/dL — AB (ref 1.7–2.4)

## 2015-01-31 MED ORDER — BOOST / RESOURCE BREEZE PO LIQD
1.0000 | Freq: Three times a day (TID) | ORAL | Status: DC
  Administered 2015-01-31 – 2015-02-07 (×13): 1 via ORAL

## 2015-01-31 MED ORDER — POTASSIUM CHLORIDE CRYS ER 20 MEQ PO TBCR
30.0000 meq | EXTENDED_RELEASE_TABLET | Freq: Once | ORAL | Status: AC
  Administered 2015-01-31: 30 meq via ORAL
  Filled 2015-01-31 (×2): qty 1

## 2015-01-31 MED ORDER — FLUCONAZOLE 100 MG PO TABS
100.0000 mg | ORAL_TABLET | Freq: Every day | ORAL | Status: DC
  Administered 2015-01-31 – 2015-02-02 (×3): 100 mg via ORAL
  Filled 2015-01-31 (×3): qty 1

## 2015-01-31 MED ORDER — POTASSIUM CHLORIDE CRYS ER 20 MEQ PO TBCR
40.0000 meq | EXTENDED_RELEASE_TABLET | Freq: Once | ORAL | Status: AC
  Administered 2015-01-31: 40 meq via ORAL
  Filled 2015-01-31: qty 2

## 2015-01-31 MED ORDER — PRO-STAT SUGAR FREE PO LIQD
30.0000 mL | Freq: Two times a day (BID) | ORAL | Status: DC
  Administered 2015-01-31 – 2015-02-04 (×4): 30 mL via ORAL
  Filled 2015-01-31 (×5): qty 30

## 2015-01-31 MED ORDER — HEPARIN (PORCINE) IN NACL 100-0.45 UNIT/ML-% IJ SOLN
1550.0000 [IU]/h | INTRAMUSCULAR | Status: DC
  Filled 2015-01-31 (×2): qty 250

## 2015-01-31 MED ORDER — HEPARIN (PORCINE) IN NACL 100-0.45 UNIT/ML-% IJ SOLN
1800.0000 [IU]/h | INTRAMUSCULAR | Status: DC
Start: 2015-01-31 — End: 2015-02-01
  Administered 2015-01-31: 1800 [IU]/h via INTRAVENOUS
  Filled 2015-01-31 (×3): qty 250

## 2015-01-31 MED ORDER — SODIUM CHLORIDE 0.9 % IV SOLN
INTRAVENOUS | Status: DC
  Administered 2015-01-31: 05:00:00 via INTRAVENOUS
  Administered 2015-02-16: 10 mL/h via INTRAVENOUS
  Administered 2015-02-18: 02:00:00 via INTRAVENOUS
  Administered 2015-02-20: 250 mL via INTRAVENOUS
  Administered 2015-02-24: 12:00:00 via INTRAVENOUS
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
## 2015-01-31 NOTE — Progress Notes (Signed)
Patient ID: Larry [name], male   DOB: 1945-10-25, 69 y.o.   MRN: [id number]  [facility] Progress Note   Assessment/ Plan:   1. Acute renal failure: Suspected probably secondary to ATN from septic shock during initial phase of admission. Overnight, with excellent response to diuretics and just some rise of creatinine which may point to the plateau phase of ATN and possibly renal recovery to follow. No acute dialysis needs noted at this time. 2. Hypokalemia: Secondary to brisk diuresis/urinary losses, replete via oral route 3. Gallbladder fossa abscess status post drain placement by interventional radiology-cultures negative and on empiric Zosyn 4. Anasarca/volume overload: Attempting diuretic therapy with net negative fluid balance overnight now 5. Anemia: Possibly associated overt losses and recent critical illness, will check iron stores and consider ESA therapy  Subjective:   No acute events overnight-continues to have problems with abdominal pain    Objective:   BP 122/54 mmHg  Pulse 101  Temp(Src) 98.7 F (37.1 C) (Oral)  Resp 28  Ht  (1.676 m)  Wt 125.3 kg (276 lb 3.8 oz)  BMI 44.61 kg/m2  SpO2 98%  Intake/Output Summary (Last 24 hours) at 01/31/15 1155 Last data filed at 01/31/15 1020  Gross per 24 hour  Intake 1176.99 ml  Output   3650 ml  Net -2473.01 ml   Weight change:   Physical Exam: Gen: Somewhat comfortable resting in bed, wife at bedside CVS: Pulse regular tachycardia, S1 and S2 normal Resp: Faint right base rales otherwise clear Abd: Soft, obese, right upper quadrant percutaneous drain in situ Ext: 2-3+ lower extremity edema as well as scrotal/penile edema  Imaging: Dg Chest Port 1 View  01/30/2015   CLINICAL DATA:  Dyspnea  EXAM: PORTABLE CHEST - 1 VIEW  COMPARISON:  01/21/2015  FINDINGS: Cardiomediastinal silhouette is stable. Right IJ central line with tip in right atrium again noted. No pulmonary edema. There is chronic elevation  of the right hemidiaphragm with right basilar atelectasis.  IMPRESSION: Right IJ central line with tip in right atrium again noted. No pulmonary edema. There is chronic elevation of the right hemidiaphragm with right basilar atelectasis.   Electronically Signed   By: Natasha Mead M.D.   On: 01/30/2015 10:25    Labs: BMET  Recent Labs Lab 01/25/15 0440 01/26/15 0545 01/27/15 0345 01/28/15 0320 01/29/15 0517 01/30/15 0440 01/31/15 0500  NA 138 140 138 139 140 130* 137  K 4.1 3.9 3.6 3.4* 3.5 3.2* 3.2*  CL 106 107 105 107 108 101 103  CO2 GLUCOSE 192* 133* 168* 131* 173* 389* 170*  BUN 50* 52* 50* 50* 50* 46* 48*  CREATININE 3.32* 3.57* 3.51* 3.58* 3.69* 3.60* 3.80*  CALCIUM 8.0* 8.2* 8.3* 8.3* 8.5* 8.0* 8.4*  PHOS 4.4 4.4 3.6 3.4 3.2 3.2 3.8   CBC  Recent Labs Lab 01/28/15 0320  01/29/15 0517 01/29/15 1437 01/30/15 0440 01/30/15 1800 01/31/15 0500  WBC 12.8*  --  12.5*  --  11.9*  --  13.4*  NEUTROABS 10.6*  --  10.4*  --  9.8*  --  11.5*  HGB 7.6*  < > 7.5* 7.7* 7.3* 7.5* 7.6*  HCT 24.2*  < > 23.7* 23.3* 22.3* 23.7* 23.8*  MCV 89.6  --  90.5  --  91.0  --  89.5  PLT 325  --  342  --  336  --  338  < > = values in this interval not displayed.  Medications:    . antiseptic oral rinse  7 mL Mouth Rinse BID  . diphenhydrAMINE  12.5 mg Intravenous Once  . feeding supplement  1 Container Oral TID BM  . feeding supplement (PRO-STAT SUGAR FREE 64)  30 mL Oral BID  . fluconazole  100 mg Oral Daily  . furosemide  120 mg Intravenous 3 times per day  . insulin aspart  0-15 Units Subcutaneous 6 times per day  . insulin glargine  25 Units Subcutaneous QHS  . nystatin   Topical TID  . ondansetron  8 mg Oral BID  . piperacillin-tazobactam (ZOSYN)  IV  2.25 g Intravenous Q8H   Zetta Bills, MD 01/31/2015, 11:55 AM

## 2015-01-31 NOTE — Care Management Important Message (Signed)
Important Message  Patient Details  Name: Larry [name] MRN: [id number] Date of Birth: 06/13/1946   Medicare Important Message Given:  Yes-second notification given    Renie Ora 01/31/2015, 4:43 PMImportant Message  Patient Details  Name: Larry [name] MRN: [id number] Date of Birth: [birth year]   Medicare Important Message Given:  Yes-second notification given    Renie Ora 01/31/2015, 4:42 PM

## 2015-01-31 NOTE — Progress Notes (Signed)
Subjective: Still has some Rt sided pain. No n/v. Coughs up phlegm with IS. Ambulated with PT yesterday - very tiring. +flatus. No burping  Objective: Vital signs in last 24 hours: Temp:  [98.7 F (37.1 C)-100 F (37.8 C)] 98.7 F (37.1 C) (08/15 0800) Pulse Rate:  [92-100] 100 (08/15 0800) Resp:  [24-42] 29 (08/15 0800) BP: (114-139)/(47-61) 114/54 mmHg (08/15 0800) SpO2:  [96 %-100 %] 97 % (08/15 0800) Weight:  [125.3 kg (276 lb 3.8 oz)] 125.3 kg (276 lb 3.8 oz) (08/15 0400) Last BM Date: 01/31/15  Intake/Output from previous day: 08/14 0701 - 08/15 0700 In: 1206.3 [P.O.:250; I.V.:723.3; IV Piggyback:215] Out: 3030 [Urine:3025; Drains:5] Intake/Output this shift: Total I/O In: 5 [Other:5] Out: 5 [Drains:5]  Alert, ox3 cta b/l Reg Obese, soft, nt, nd, drain - serous Pitting edema b/l LE  Lab Results:   Recent Labs  01/30/15 0440 01/30/15 1800 01/31/15 0500  WBC 11.9*  --  13.4*  HGB 7.3* 7.5* 7.6*  HCT 22.3* 23.7* 23.8*  PLT 336  --  338   BMET  Recent Labs  01/30/15 0440 01/31/15 0500  NA 130* 137  K 3.2* 3.2*  CL 101 103  CO2 24 26  GLUCOSE 389* 170*  BUN 46* 48*  CREATININE 3.60* 3.80*  CALCIUM 8.0* 8.4*   PT/INR No results for input(s): LABPROT, INR in the last 72 hours. ABG No results for input(s): PHART, HCO3 in the last 72 hours.  Invalid input(s): PCO2, PO2  Studies/Results: Dg Chest Port 1 View  01/30/2015   CLINICAL DATA:  Dyspnea  EXAM: PORTABLE CHEST - 1 VIEW  COMPARISON:  01/21/2015  FINDINGS: Cardiomediastinal silhouette is stable. Right IJ central line with tip in right atrium again noted. No pulmonary edema. There is chronic elevation of the right hemidiaphragm with right basilar atelectasis.  IMPRESSION: Right IJ central line with tip in right atrium again noted. No pulmonary edema. There is chronic elevation of the right hemidiaphragm with right basilar atelectasis.   Electronically Signed   By: Natasha Mead M.D.   On: 01/30/2015  10:25    Anti-infectives: Anti-infectives    Start     Dose/Rate Route Frequency Ordered Stop   01/31/15 1000  fluconazole (DIFLUCAN) tablet 100 mg     100 mg Oral Daily 01/31/15 0727     01/29/15 1700  piperacillin-tazobactam (ZOSYN) IVPB 2.25 g     2.25 g 100 mL/hr over 30 Minutes Intravenous Every 8 hours 01/29/15 0845     01/24/15 2200  vancomycin (VANCOCIN) IVPB 1000 mg/200 mL premix     1,000 mg 200 mL/hr over 60 Minutes Intravenous  Once 01/24/15 1349 01/24/15 2301   01/21/15 2000  vancomycin (VANCOCIN) 1,500 mg in sodium chloride 0.9 % 500 mL IVPB  Status:  Discontinued     1,500 mg 250 mL/hr over 120 Minutes Intravenous Every 24 hours 01/21/15 1048 01/23/15 1726   01/20/15 2000  vancomycin (VANCOCIN) 1,250 mg in sodium chloride 0.9 % 250 mL IVPB  Status:  Discontinued     1,250 mg 166.7 mL/hr over 90 Minutes Intravenous Every 24 hours 01/19/15 1945 01/20/15 0843   01/20/15 2000  vancomycin (VANCOCIN) 1,250 mg in sodium chloride 0.9 % 250 mL IVPB  Status:  Discontinued     1,250 mg 166.7 mL/hr over 90 Minutes Intravenous Every 24 hours 01/20/15 1519 01/21/15 1048   01/20/15 0000  piperacillin-tazobactam (ZOSYN) IVPB 3.375 g  Status:  Discontinued     3.375 g 12.5 mL/hr over  240 Minutes Intravenous Every 8 hours 01/19/15 1946 01/29/15 0845   01/19/15 1715  vancomycin (VANCOCIN) 2,500 mg in sodium chloride 0.9 % 500 mL IVPB     2,500 mg 250 mL/hr over 120 Minutes Intravenous  Once 01/19/15 1707 01/19/15 2159   01/19/15 1715  piperacillin-tazobactam (ZOSYN) IVPB 3.375 g     3.375 g 100 mL/hr over 30 Minutes Intravenous  Once 01/19/15 1707 01/19/15 1900      Assessment/Plan: -Neuro - seems approp -Pulm - cont pulm toilet, OOB, to chair -CV - bp ok. Hep gtt for PAF.  -Heme - anemia, hgb stable so far -GI- poor po, moderate PCMN, starting supplemental shakes today, discussed importance of PO -Renal - AKI on CKD; Cr up but making urine, appears to be responding to Lasix,  cont foley for now, has UTI, urine cx pending; renal following - appreciate assist -ID - gallbladder hematoma - no org on G stain, mixed org on cultures, nothing predominant; at this point I'm really not in favor of continuing abx for GB fossa hematoma given renal issues. Will consult ID to weigh; appears to have UTI, awaiting urine cx, this may explain persistent leukcytosis -Endo - cont SSI -F/E/N - hypokalemia - will replace potassium; will need to watch intravascular volume status while on scheduled lasix  Cont PT/OT  At this point not in favor of upsizing perc drain as I don't think it will need drain the hematoma. Will let hematoma resolve on own.   Appreciate triad assist  [name]. [name], MD, FACS General, Bariatric, & Minimally Invasive Surgery [facility], [place]   LOS: 12 days    Larry [name] 01/31/2015

## 2015-01-31 NOTE — Progress Notes (Signed)
Nutrition Follow-up  DOCUMENTATION CODES:   Morbid obesity  INTERVENTION:  - Continue Soft diet - Will order Boost Breeze TID, each supplement provides 250 kcal and 9 grams protein - Will order Prostat BID, each supplement provides 100 kcal and 15 grams protein - If poor intakes persist, recommend Panda withVital AF 1.2 @ 60 mL/hr to provide 1728 kcal (97% minimal needs), 108 grams protein, and 1168 mL free water - RD will continue to monitor for needs  NUTRITION DIAGNOSIS:   Inadequate oral intake related to inability to eat as evidenced by NPO status. -diet now ordered but pt only eating 5-10% on average  GOAL:   Patient will meet greater than or equal to 90% of their needs -unmet  MONITOR:   Diet advancement, Weight trends, Labs, I & O's  ASSESSMENT:   69 y/o admitted in 10/05/2012 with Sepsis, hypotension, acute renal failure and acute cholecystitis. He had been place on Eliquis for his AF by the [place]. He had an MI 2 years ago and did not follow up with his cardiologist. He was acutely ill and underwent a percutaneous drain placement on 10/06/14 by IR. He went home on 10/11/14. He was readmitted on 12/30/14 and underwent laparoscopic cholecystectomy with IOC. He was left with a drain in place and the site had "SNOW' placed in the GB fossa for hemostasis after the procedure. Drain was removed last week but there was some brusing at the site. He is transferred from [facility] with complaints of increased abdominal pain.  8/15 Pt's diet advanced to Soft on 8/10 but pt has only been eating 5-10% on average which is not meeting needs. Pt reports abdominal pain which is consistent and not exacerbated by intakes. He states he likes brunch but family member in the room states pt has not even been eating these types of foods.  Family member asks about good options on Soft diet. Talked with him about avoiding crunchy items and that pt will be unable to have raw fruits and vegetables.  Encouraged items that require minimal chewing if pt is feeling weak such as yogurt, pudding, and similar items. Family member states that RN was ordering pt sausage gravy for breakfast.   Will order supplements as outlined above as feel pt will do better with a clear liquid supplement at this time and will order Prostat to increase protein. Surgery note indicates that pt has had difficulty with ambulation d/t edema and only able to ambulate from bed to chair.  If poor intakes persist, recommend Panda tube with TF regimen as outlined above.   Medications reviewed. Labs reviewed; CBGs: 151-274 mg/dL, K: 3.2 mmol/L, BUN/creatinine elevated, Ca: 8.4 mg/dL, Mg: 1.6 mg/dL, GFR: 15.    8/9 -Pt admitted 8/3 and on CLD or NPO since that date (6 days).  - Even on CLD pt has consumed very little and is unable to meet nutritional needs.  - CCM notes indicate oliguric renal failure and Hgb stable; pt previously with blood in abdomen. - Recommend Panda tube with TF as outlined above if possible. If unable to feed pt enterally, recommend TPN.   8/4 - Pt has had a poor appetite for 1 month PTA and las good meal was 8/2 - N/V with unknown start date and complaining of 10/10 pain recently   Diet Order:  DIET SOFT Room service appropriate?: Yes; Fluid consistency:: Thin  Skin:  Wound (see comment) (abdominal wound (first recorded 01/04/15))  Last BM:  8/14  Height:   Ht  Readings from Last 1 Encounters:  01/20/15  (1.676 m)    Weight:   Wt Readings from Last 1 Encounters:  01/31/15 276 lb 3.8 oz (125.3 kg)    Ideal Body Weight:  64.54 kg (kg)  BMI:  Body mass index is 44.61 kg/(m^2).  Estimated Nutritional Needs:   Kcal:  1610-9604  Protein:  95-105 grams  Fluid:  1.7-2.2 L/day  EDUCATION NEEDS:   No education needs identified at this time     Trenton Gammon, RD, LDN Inpatient Clinical Dietitian Pager # [phone] After hours/weekend pager # [phone]
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
## 2015-01-31 NOTE — Plan of Care (Signed)
Problem: Phase III Progression Outcomes Goal: Activity at appropriate level-compared to baseline (UP IN CHAIR FOR HEMODIALYSIS)  Outcome: Progressing OOB to chair today- well tolerated

## 2015-01-31 NOTE — Clinical Social Work Note (Signed)
Clinical Social Work Assessment  Patient Details  Name: Larry [name] MRN: [id number] Date of Birth: 1946/02/21  Date of referral:  01/31/15               Reason for consult:  Facility Placement, Discharge Planning                Permission sought to share information with:    Permission granted to share information::     Name::        Agency::     Relationship::     Contact Information:     Housing/Transportation Living arrangements for the past 2 months:  Single Family Home Source of Information:  Adult Children Patient Interpreter Needed:  None Criminal Activity/Legal Involvement Pertinent to Current Situation/Hospitalization:  No - Comment as needed Significant Relationships:  Adult Children Lives with:  Spouse Do you feel safe going back to the place where you live?  Yes Need for family participation in patient care:  Yes (Comment)  Care giving concerns: Family is unable to manage pt's care, at home, following hospital d/c.   Social Worker assessment / plan:  Pt hospitalized on 01/19/15 with multiple medical concerns including intraabdominal abscess, peritonitis. CSW consulted to assist with d/c planning. PN reviewed. PT has recommended SNF placement at d/c. CSW met with pt, son/daughter in-law, and sister. Family was in the process of encouraging pt to accept fluids. Pt appeared weak, and tired. CSW met with pt's daughter in-law outside pt's room to review PT recommendations and provide SNF information. CSW will speak with pt / spouse prior to initiating SNF search. CSW will continue to follow to assist with d/c planning. Employment status:  Retired Nurse, adult PT Recommendations:    Information / Referral to community resources:  [place]  Patient/Family's Response to care:  Daughter in-law reports spouse is unable to care for pt at his present level of care, at home.    Patient/Family's Understanding of and Emotional Response to  Diagnosis, Current Treatment, and Prognosis:  Family has a good understanding of pt's medical condition. They feel rehab will be needed due to pt's deconditioning, though family does not feel pt will be happy with this plan.  Emotional Assessment Appearance:  Appears older than stated age Attitude/Demeanor/Rapport:  Lethargic Affect (typically observed):  Quiet Orientation:  Oriented to Self, Oriented to Place, Oriented to  Time, Oriented to Situation Alcohol / Substance use:  Not Applicable Psych involvement (Current and /or in the community):  No (Comment)  Discharge Needs  Concerns to be addressed:  Discharge Planning Concerns Readmission within the last 30 days:  Yes Current discharge risk:  None Barriers to Discharge:  No Barriers Identified   Luretha Rued, [place] 01/31/2015, 3:09 PM
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
## 2015-02-01 DIAGNOSIS — Y838 Other surgical procedures as the cause of abnormal reaction of the patient, or of later complication, without mention of misadventure at the time of the procedure: Secondary | ICD-10-CM

## 2015-02-01 DIAGNOSIS — B999 Unspecified infectious disease: Secondary | ICD-10-CM | POA: Diagnosis present

## 2015-02-01 DIAGNOSIS — Z960 Presence of urogenital implants: Secondary | ICD-10-CM

## 2015-02-01 DIAGNOSIS — D72829 Elevated white blood cell count, unspecified: Secondary | ICD-10-CM

## 2015-02-01 DIAGNOSIS — R8290 Unspecified abnormal findings in urine: Secondary | ICD-10-CM

## 2015-02-01 DIAGNOSIS — Z9049 Acquired absence of other specified parts of digestive tract: Secondary | ICD-10-CM

## 2015-02-01 DIAGNOSIS — K91840 Postprocedural hemorrhage and hematoma of a digestive system organ or structure following a digestive system procedure: Secondary | ICD-10-CM

## 2015-02-01 DIAGNOSIS — IMO0002 Reserved for concepts with insufficient information to code with codable children: Secondary | ICD-10-CM | POA: Diagnosis not present

## 2015-02-01 LAB — HEPARIN LEVEL (UNFRACTIONATED)
HEPARIN UNFRACTIONATED: 0.36 [IU]/mL (ref 0.30–0.70)
Heparin Unfractionated: 0.26 IU/mL — ABNORMAL LOW (ref 0.30–0.70)
Heparin Unfractionated: 0.27 IU/mL — ABNORMAL LOW (ref 0.30–0.70)

## 2015-02-01 LAB — CBC WITH DIFFERENTIAL/PLATELET
BASOS ABS: 0 10*3/uL (ref 0.0–0.1)
BASOS PCT: 0 % (ref 0–1)
EOS PCT: 1 % (ref 0–5)
Eosinophils Absolute: 0.1 10*3/uL (ref 0.0–0.7)
HEMATOCRIT: 23.8 % — AB (ref 39.0–52.0)
HEMOGLOBIN: 7.6 g/dL — AB (ref 13.0–17.0)
LYMPHS PCT: 7 % — AB (ref 12–46)
Lymphs Abs: 1 10*3/uL (ref 0.7–4.0)
MCH: 28.6 pg (ref 26.0–34.0)
MCHC: 31.9 g/dL (ref 30.0–36.0)
MCV: 89.5 fL (ref 78.0–100.0)
MONOS PCT: 9 % (ref 3–12)
Monocytes Absolute: 1.3 10*3/uL — ABNORMAL HIGH (ref 0.1–1.0)
NEUTROS ABS: 12.2 10*3/uL — AB (ref 1.7–7.7)
Neutrophils Relative %: 83 % — ABNORMAL HIGH (ref 43–77)
Platelets: 368 10*3/uL (ref 150–400)
RBC: 2.66 MIL/uL — ABNORMAL LOW (ref 4.22–5.81)
RDW: 16.4 % — ABNORMAL HIGH (ref 11.5–15.5)
WBC: 14.6 10*3/uL — ABNORMAL HIGH (ref 4.0–10.5)

## 2015-02-01 LAB — PHOSPHORUS: PHOSPHORUS: 3.9 mg/dL (ref 2.5–4.6)

## 2015-02-01 LAB — BASIC METABOLIC PANEL
Anion gap: 8 (ref 5–15)
BUN: 48 mg/dL — AB (ref 6–20)
CHLORIDE: 101 mmol/L (ref 101–111)
CO2: 27 mmol/L (ref 22–32)
Calcium: 8.4 mg/dL — ABNORMAL LOW (ref 8.9–10.3)
Creatinine, Ser: 3.92 mg/dL — ABNORMAL HIGH (ref 0.61–1.24)
GFR calc Af Amer: 17 mL/min — ABNORMAL LOW (ref >60)
GFR calc non Af Amer: 14 mL/min — ABNORMAL LOW (ref >60)
Glucose, Bld: 200 mg/dL — ABNORMAL HIGH (ref 65–99)
POTASSIUM: 3.2 mmol/L — AB (ref 3.5–5.1)
SODIUM: 136 mmol/L (ref 135–145)

## 2015-02-01 LAB — URINE CULTURE

## 2015-02-01 LAB — GLUCOSE, CAPILLARY
GLUCOSE-CAPILLARY: 172 mg/dL — AB (ref 65–99)
GLUCOSE-CAPILLARY: 184 mg/dL — AB (ref 65–99)
GLUCOSE-CAPILLARY: 185 mg/dL — AB (ref 65–99)
GLUCOSE-CAPILLARY: 235 mg/dL — AB (ref 65–99)
Glucose-Capillary: 157 mg/dL — ABNORMAL HIGH (ref 65–99)
Glucose-Capillary: 194 mg/dL — ABNORMAL HIGH (ref 65–99)

## 2015-02-01 LAB — MAGNESIUM: Magnesium: 1.5 mg/dL — ABNORMAL LOW (ref 1.7–2.4)

## 2015-02-01 MED ORDER — POTASSIUM CHLORIDE CRYS ER 20 MEQ PO TBCR
40.0000 meq | EXTENDED_RELEASE_TABLET | Freq: Three times a day (TID) | ORAL | Status: DC
  Administered 2015-02-01 – 2015-02-02 (×3): 40 meq via ORAL
  Filled 2015-02-01 (×3): qty 2

## 2015-02-01 MED ORDER — MAGNESIUM OXIDE 400 (241.3 MG) MG PO TABS
400.0000 mg | ORAL_TABLET | Freq: Two times a day (BID) | ORAL | Status: DC
  Administered 2015-02-01 – 2015-02-06 (×10): 400 mg via ORAL
  Filled 2015-02-01 (×11): qty 1

## 2015-02-01 MED ORDER — FUROSEMIDE 10 MG/ML IJ SOLN
80.0000 mg | Freq: Three times a day (TID) | INTRAMUSCULAR | Status: DC
  Administered 2015-02-01 – 2015-02-04 (×9): 80 mg via INTRAVENOUS
  Filled 2015-02-01 (×9): qty 8

## 2015-02-01 MED ORDER — POTASSIUM CHLORIDE CRYS ER 20 MEQ PO TBCR
40.0000 meq | EXTENDED_RELEASE_TABLET | Freq: Two times a day (BID) | ORAL | Status: DC
  Administered 2015-02-01: 40 meq via ORAL
  Filled 2015-02-01: qty 2

## 2015-02-01 MED ORDER — MAGNESIUM SULFATE 2 GM/50ML IV SOLN
2.0000 g | Freq: Once | INTRAVENOUS | Status: AC
  Administered 2015-02-01: 2 g via INTRAVENOUS
  Filled 2015-02-01: qty 50

## 2015-02-01 MED ORDER — HEPARIN (PORCINE) IN NACL 100-0.45 UNIT/ML-% IJ SOLN
1950.0000 [IU]/h | INTRAMUSCULAR | Status: DC
  Administered 2015-02-01: 1950 [IU]/h via INTRAVENOUS
  Filled 2015-02-01 (×3): qty 250

## 2015-02-01 MED ORDER — HEPARIN (PORCINE) IN NACL 100-0.45 UNIT/ML-% IJ SOLN
2200.0000 [IU]/h | INTRAMUSCULAR | Status: DC
  Administered 2015-02-02 – 2015-02-03 (×3): 2100 [IU]/h via INTRAVENOUS
  Filled 2015-02-01 (×8): qty 250
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
## 2015-02-01 NOTE — Progress Notes (Signed)
Pharmacy Consult Note - IV heparin follow up  Labs: heparin level 0.36  A/P: heparin level therapeutic (goal 0.3-0.7 for hx Afib) on current rate of 2100 units/hr. Continue current rate. Recheck heparin level with AM labs  Hessie Knows, PharmD, BCPS Pager [phone] 02/01/2015 10:50 PM
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
## 2015-02-01 NOTE — Progress Notes (Signed)
Date:  February 01, 2015 U.R. performed for needs and level of care. Will continue to follow for Case Management needs.  Marcelle Smiling, RN, BSN, [place]   [phone]

## 2015-02-01 NOTE — Progress Notes (Addendum)
[facility]    Subjective: No new complaints   Antibiotics:  Anti-infectives    Start     Dose/Rate Route Frequency Ordered Stop   01/31/15 1000  fluconazole (DIFLUCAN) tablet 100 mg     100 mg Oral Daily 01/31/15 0727     01/29/15 1700  piperacillin-tazobactam (ZOSYN) IVPB 2.25 g     2.25 g 100 mL/hr over 30 Minutes Intravenous Every 8 hours 01/29/15 0845     01/24/15 2200  vancomycin (VANCOCIN) IVPB 1000 mg/200 mL premix     1,000 mg 200 mL/hr over 60 Minutes Intravenous  Once 01/24/15 1349 01/24/15 2301   01/21/15 2000  vancomycin (VANCOCIN) 1,500 mg in sodium chloride 0.9 % 500 mL IVPB  Status:  Discontinued     1,500 mg 250 mL/hr over 120 Minutes Intravenous Every 24 hours 01/21/15 1048 01/23/15 1726   01/20/15 2000  vancomycin (VANCOCIN) 1,250 mg in sodium chloride 0.9 % 250 mL IVPB  Status:  Discontinued     1,250 mg 166.7 mL/hr over 90 Minutes Intravenous Every 24 hours 01/19/15 1945 01/20/15 0843   01/20/15 2000  vancomycin (VANCOCIN) 1,250 mg in sodium chloride 0.9 % 250 mL IVPB  Status:  Discontinued     1,250 mg 166.7 mL/hr over 90 Minutes Intravenous Every 24 hours 01/20/15 1519 01/21/15 1048   01/20/15 0000  piperacillin-tazobactam (ZOSYN) IVPB 3.375 g  Status:  Discontinued     3.375 g 12.5 mL/hr over 240 Minutes Intravenous Every 8 hours 01/19/15 1946 01/29/15 0845   01/19/15 1715  vancomycin (VANCOCIN) 2,500 mg in sodium chloride 0.9 % 500 mL IVPB     2,500 mg 250 mL/hr over 120 Minutes Intravenous  Once 01/19/15 1707 01/19/15 2159   01/19/15 1715  piperacillin-tazobactam (ZOSYN) IVPB 3.375 g     3.375 g 100 mL/hr over 30 Minutes Intravenous  Once 01/19/15 1707 01/19/15 1900      Medications: Scheduled Meds: . antiseptic oral rinse  7 mL Mouth Rinse BID  . diphenhydrAMINE  12.5 mg Intravenous Once  . feeding supplement  1 Container Oral TID BM  . feeding supplement (PRO-STAT SUGAR FREE 64)  30 mL Oral BID    . fluconazole  100 mg Oral Daily  . furosemide  80 mg Intravenous 3 times per day  . insulin aspart  0-15 Units Subcutaneous 6 times per day  . insulin glargine  25 Units Subcutaneous QHS  . magnesium oxide  400 mg Oral BID  . nystatin   Topical TID  . ondansetron  8 mg Oral BID  . piperacillin-tazobactam (ZOSYN)  IV  2.25 g Intravenous Q8H  . potassium chloride  40 mEq Oral TID   Continuous Infusions: . sodium chloride 10 mL/hr at 01/31/15 0508  . heparin 1,950 Units/hr (02/01/15 1015)   PRN Meds:.fentaNYL (SUBLIMAZE) injection, levalbuterol, LORazepam, ondansetron **OR** ondansetron (ZOFRAN) IV    Objective: Weight change:   Intake/Output Summary (Last 24 hours) at 02/01/15 1314 Last data filed at 02/01/15 1200  Gross per 24 hour  Intake 1842.69 ml  Output   2726 ml  Net -883.31 ml   Blood pressure 120/55, pulse 95, temperature 99.1 F (37.3 C), temperature source Oral, resp. rate 38, height 5\' 6"  (1.676 m), weight 269 lb 6.4 oz (122.2 kg), SpO2 95 %. Temp:  [98.5 F (36.9 C)-99.9 F (37.7 C)] 99.1 F (37.3 C) (08/16 1200) Pulse Rate:  [91-115] 95 (08/16  1200) Resp:  [21-45] 38 (08/16 1200) BP: (112-150)/(49-67) 120/55 mmHg (08/16 1200) SpO2:  [93 %-98 %] 95 % (08/16 1200) Weight:  [269 lb 6.4 oz (122.2 kg)] 269 lb 6.4 oz (122.2 kg) (08/16 0800)  Physical Exam: General: Alert and awake, oriented x3, not in any acute distress. HEENT: anicteric sclera, , EOMI CVS regular rate, normal r,  no murmur rubs or gallops Chest: clear to auscultation bilaterally, anteriorly Abdomen: tender in RUQ, +bs, drain with bloody material Extremities: no  clubbing or edema noted bilaterally Skin: no rashes Neuro: nonfocal  CBC: CBC Latest Ref Rng 02/01/2015 01/31/2015 01/30/2015  WBC 4.0 - 10.5 K/uL 14.6(H) 13.4(H) -  Hemoglobin 13.0 - 17.0 g/dL 7.6(L) 7.6(L) 7.5(L)  Hematocrit 39.0 - 52.0 % 23.8(L) 23.8(L) 23.7(L)  Platelets 150 - 400 K/uL 368 338 -       BMET  Recent  Labs  01/31/15 0500 02/01/15 0418  NA 137 136  K 3.2* 3.2*  CL 103 101  CO2 26 27  GLUCOSE 170* 200*  BUN 48* 48*  CREATININE 3.80* 3.92*  CALCIUM 8.4* 8.4*     Liver Panel   Recent Labs  01/30/15 0440  PROT 5.6*  ALBUMIN 2.1*  AST 16  ALT 14*  ALKPHOS 65  BILITOT 1.3*       Sedimentation Rate No results for input(s): ESRSEDRATE in the last 72 hours. C-Reactive Protein No results for input(s): CRP in the last 72 hours.  Micro Results: Recent Results (from the past 720 hour(s))  Culture, routine-abscess     Status: None   Collection Time: 01/20/15  1:00 PM  Result Value Ref Range Status   Specimen Description ABSCESS GALL BLADDER FOSSA  Final   Special Requests Normal  Final   Gram Stain   Final    ABUNDANT WBC PRESENT,BOTH PMN AND MONONUCLEAR NO SQUAMOUS EPITHELIAL CELLS SEEN NO ORGANISMS SEEN Performed at [facility]    Culture   Final    MULTIPLE ORGANISMS PRESENT, NONE PREDOMINANT Note: NO STAPHYLOCOCCUS AUREUS ISOLATED NO GROUP A STREP (S.PYOGENES) ISOLATED Performed at [facility]    Report Status 01/23/2015 FINAL  Final  Anaerobic culture     Status: None   Collection Time: 01/20/15  1:00 PM  Result Value Ref Range Status   Specimen Description ABSCESS GALL BLADDER FOSSA  Final   Special Requests Normal  Final   Gram Stain   Final    ABUNDANT WBC PRESENT,BOTH PMN AND MONONUCLEAR NO SQUAMOUS EPITHELIAL CELLS SEEN NO ORGANISMS SEEN Performed at [facility]    Culture   Final    NO ANAEROBES ISOLATED Performed at [facility]    Report Status 01/26/2015 FINAL  Final  MRSA PCR Screening     Status: None   Collection Time: 01/20/15  1:39 PM  Result Value Ref Range Status   MRSA by PCR NEGATIVE NEGATIVE Final    Comment:        The GeneXpert MRSA Assay (FDA approved for NASAL specimens only), is one component of a comprehensive MRSA colonization surveillance program. It is not intended to diagnose  MRSA infection nor to guide or monitor treatment for MRSA infections. Performed at [facility]   Culture, blood (routine x 2)     Status: None   Collection Time: 01/20/15  1:50 PM  Result Value Ref Range Status   Specimen Description BLOOD RIGHT ARM  Final   Special Requests IN PEDIATRIC BOTTLE 4CC  Final   Culture  Final    NO GROWTH 5 DAYS Performed at [facility]    Report Status 01/25/2015 FINAL  Final  Culture, blood (routine x 2)     Status: None   Collection Time: 01/20/15  1:55 PM  Result Value Ref Range Status   Specimen Description BLOOD RIGHT HAND  Final   Special Requests IN PEDIATRIC BOTTLE 4CC  Final   Culture   Final    NO GROWTH 5 DAYS Performed at [facility]    Report Status 01/25/2015 FINAL  Final  Urine culture     Status: None (Preliminary result)   Collection Time: 01/30/15 11:40 AM  Result Value Ref Range Status   Specimen Description URINE, RANDOM  Final   Special Requests NONE  Final   Culture   Final    TOO YOUNG TO READ Performed at [facility]    Report Status PENDING  Incomplete    Studies/Results: No results found.    Assessment/Plan:  Principal Problem:   Shock circulatory Active Problems:   Diabetes mellitus without complication   HTN (hypertension)   Paroxysmal atrial fibrillation   Obesity   Intra-abdominal abscess   Paroxysmal a-fib   Sepsis   Acute respiratory distress   Intra-abdominal hematoma   Acute on chronic kidney failure   Acute blood loss anemia   Catheter-associated urinary tract infection    KENDEL BESSEY is a 69 y.o. male sp cholectystectomy with gall bladder fossa infected hematoma, sp drain placement and culture from this yielding mx organism but no staph, strep, on vanco/zosyn with persistently elevated WBC and with concern for CA-UTI along the way  #1 Gall bladder fossa hematom with superinfection:  I suspect THIS is what is driving his WBC to stay up the hematoma  did NOT change in size between placement of the drain and CT on the 11th  I would consider re-imaging 7-10 days after the scan on the 11th  #2 ? Ca-UTI He is quiet broadly covered with zosyn at present though described on UA as turbid. He has zero symptoms that would point me to UTI, I will nonetheless follow this culture for now but not clear to me how much of an issue this urine is. There were yeast seen on GS and if they grow would be indication to change the catheter but not for antifungal therapy    #2 ? CA-UTI: his urine is clear and      LOS: 13 days   Acey Lav 02/01/2015, 1:14 PM

## 2015-02-01 NOTE — Progress Notes (Signed)
Occupational Therapy Treatment Patient Details Name: Larry [name] MRN: [id number] DOB: 1946/04/27 Today's Date: 02/01/2015    History of present illness 69 year old male w/ CAF on elliquis recently underwent lap chole on 7/14. Was discharged to home w/drain. Drain removed about a week before presentation to ER. Admitted directly  from [facility] on 8/3 w/ septic shock which was likely due to infected hematoma in the GB fossa.  new perc drain placed on 8/4, when he remained hypotensive   OT comments  Improved ROM in L elbow; hand edemenous; red foam issued  Follow Up Recommendations  SNF;Supervision/Assistance - 24 hour    Equipment Recommendations  3 in 1 bedside comode    Recommendations for Other Services      Precautions / Restrictions Precautions Precautions: Fall Precaution Comments: 02, drain Restrictions Weight Bearing Restrictions: No       Mobility Bed Mobility     Rolling: Min assist;Mod assist         General bed mobility comments: used rails.  Pulled pad to assist wtih rolling to L (mod A); min to R  Transfers                      Balance                                   ADL                                         General ADL Comments: provided built up red foam for toothbrushing:  pt was able to bring toothbrush to mouth. Did not want to brush teeth, as he needed to get cleaned up.  Assisted NT.  Need A x 2 as testicles have to be supported for comfort.  Educated on edema management and repositioned.  Also educated on moving fingers for edema management also.  Gave pt squeeze ball to use with RUE to increase ROM.  Pt is able to move L elbow more comfortably at least 100 degrees of flexion      Vision                     Perception     Praxis      Cognition   Behavior During Therapy: [facility] for tasks assessed/performed Overall Cognitive Status: Within Functional Limits for tasks  assessed                       Extremity/Trunk Assessment               Exercises     Shoulder Instructions       General Comments      Pertinent Vitals/ Pain       Pain Score: 8  (dorsal side of L 2nd digit also sore) Pain Location: scrotum with hygiene Pain Descriptors / Indicators: Sore Pain Intervention(s): Limited activity within patient's tolerance;Monitored during session  Home Living                                          Prior Functioning/Environment              Frequency  Min 2X/week     Progress Toward Goals  OT Goals(current goals can now be found in the care plan section)  Progress towards OT goals: Progressing toward goals  Acute Rehab OT Goals Patient Stated Goal: to get the catheter out,  Plan      Co-evaluation                 End of Session     Activity Tolerance Patient tolerated treatment well   Patient Left in bed;with call bell/phone within reach   Nurse Communication          Time: 1610-9604 OT Time Calculation (min): 33 min  Charges: OT General Charges $OT Visit: 1 Procedure OT Treatments $Self Care/Home Management : 8-22 mins $Therapeutic Activity: 8-22 mins  Rivers Hamrick 02/01/2015, 3:29 PM  Marica Otter, OTR/L [phone] 02/01/2015

## 2015-02-01 NOTE — Progress Notes (Signed)
Patient ID: Larry [name], male   DOB: [birth year], 69 y.o.   MRN: [id number]  [facility] Progress Note   Assessment/ Plan:   1. Acute renal failure: appears to be ATN associated with features of sepsis during his initial phase of hospitalization. Currently with excellent urinary output in response to furosemide. Will decrease furosemide now to 80 mg IV 3 times a day 2. Hypokalemia/hypomagnesemia: Secondary to brisk diuresis/urinary losses, replete via oral route for potassium and both oral and intravenous route for magnesium 3. Gallbladder fossa abscess status post drain placement by interventional radiology-cultures negative and on empiric Zosyn-seen by infectious disease yesterday and repeat cultures checked 4. Anasarca/volume overload: Attempting diuretic therapy with net negative fluid balance overnight now 5. Anemia: Possibly associated overt losses and recent critical illness, will check iron stores and consider ESA therapy  Subjective:   No acute events overnight-continues to have problems with abdominal pain/poor appetite/weakness    Objective:   BP 120/56 mmHg  Pulse 108  Temp(Src) 98.5 F (36.9 C) (Oral)  Resp 23  Ht  (1.676 m)  Wt 125.3 kg (276 lb 3.8 oz)  BMI 44.61 kg/m2  SpO2 93%  Intake/Output Summary (Last 24 hours) at 02/01/15 1050 Last data filed at 02/01/15 0900  Gross per 24 hour  Intake 1344.19 ml  Output   2026 ml  Net -681.81 ml   Weight change:   Physical Exam: Gen: Appears to be comfortable resting in bed, wife at bedside CVS: Pulse regular tachycardia, S1 and S2 normal Resp: Faint right base rales otherwise clear Abd: Soft, obese, right upper quadrant percutaneous drain in situ Ext: 2+ lower extremity edema as well as scrotal/penile edema-improving  Imaging: No results found.  Labs: BMET  Recent Labs Lab 01/26/15 0545 01/27/15 0345 01/28/15 0320 01/29/15 0517 01/30/15 0440 01/31/15 0500 02/01/15 0418  NA 140 138  139 140 130* 137 136  K 3.9 3.6 3.4* 3.5 3.2* 3.2* 3.2*  CL 107 105 107 108 101 103 101  CO2 GLUCOSE 133* 168* 131* 173* 389* 170* 200*  BUN 52* 50* 50* 50* 46* 48* 48*  CREATININE 3.57* 3.51* 3.58* 3.69* 3.60* 3.80* 3.92*  CALCIUM 8.2* 8.3* 8.3* 8.5* 8.0* 8.4* 8.4*  PHOS 4.4 3.6 3.4 3.2 3.2 3.8 3.9   CBC  Recent Labs Lab 01/29/15 0517  01/30/15 0440 01/30/15 1800 01/31/15 0500 02/01/15 0418  WBC 12.5*  --  11.9*  --  13.4* 14.6*  NEUTROABS 10.4*  --  9.8*  --  11.5* 12.2*  HGB 7.5*  < > 7.3* 7.5* 7.6* 7.6*  HCT 23.7*  < > 22.3* 23.7* 23.8* 23.8*  MCV 90.5  --  91.0  --  89.5 89.5  PLT 342  --  336  --  338 368  < > = values in this interval not displayed.  Medications:    . antiseptic oral rinse  7 mL Mouth Rinse BID  . diphenhydrAMINE  12.5 mg Intravenous Once  . feeding supplement  1 Container Oral TID BM  . feeding supplement (PRO-STAT SUGAR FREE 64)  30 mL Oral BID  . fluconazole  100 mg Oral Daily  . furosemide  120 mg Intravenous 3 times per day  . insulin aspart  0-15 Units Subcutaneous 6 times per day  . insulin glargine  25 Units Subcutaneous QHS  . magnesium sulfate 1 - 4 g bolus IVPB  2 g Intravenous Once  . nystatin  Topical TID  . ondansetron  8 mg Oral BID  . piperacillin-tazobactam (ZOSYN)  IV  2.25 g Intravenous Q8H  . potassium chloride  40 mEq Oral BID   Zetta Bills, MD 02/01/2015, 10:50 AM

## 2015-02-01 NOTE — Progress Notes (Signed)
Subjective: No n/v. +Bmx2. Spent 30 min in chair this am. Very fatigued. Liked the resource but still not eating much  Objective: Vital signs in last 24 hours: Temp:  [98.5 F (36.9 C)-99.9 F (37.7 C)] 98.5 F (36.9 C) (08/16 0800) Pulse Rate:  [91-115] 108 (08/16 0800) Resp:  [21-45] 23 (08/16 0800) BP: (112-150)/(48-67) 120/56 mmHg (08/16 0800) SpO2:  [93 %-98 %] 93 % (08/16 0800) Last BM Date: 01/31/15  Intake/Output from previous day: 08/15 0701 - 08/16 0700 In: 1749.7 [P.O.:600; I.V.:848.7; IV Piggyback:286] Out: 2781 [Urine:2765; Drains:15; Stool:1] Intake/Output this shift: Total I/O In: 39.5 [I.V.:39.5] Out: -   Alert, ox3 cta  Reg Obese, soft, nt, nd; drain - min +pitting edema b/l LE - a little improved.   Lab Results:   Recent Labs  01/31/15 0500 02/01/15 0418  WBC 13.4* 14.6*  HGB 7.6* 7.6*  HCT 23.8* 23.8*  PLT 338 368   BMET  Recent Labs  01/31/15 0500 02/01/15 0418  NA 137 136  K 3.2* 3.2*  CL 103 101  CO2 26 27  GLUCOSE 170* 200*  BUN 48* 48*  CREATININE 3.80* 3.92*  CALCIUM 8.4* 8.4*   PT/INR No results for input(s): LABPROT, INR in the last 72 hours. ABG No results for input(s): PHART, HCO3 in the last 72 hours.  Invalid input(s): PCO2, PO2  Studies/Results: No results found.  Anti-infectives: Anti-infectives    Start     Dose/Rate Route Frequency Ordered Stop   01/31/15 1000  fluconazole (DIFLUCAN) tablet 100 mg     100 mg Oral Daily 01/31/15 0727     01/29/15 1700  piperacillin-tazobactam (ZOSYN) IVPB 2.25 g     2.25 g 100 mL/hr over 30 Minutes Intravenous Every 8 hours 01/29/15 0845     01/24/15 2200  vancomycin (VANCOCIN) IVPB 1000 mg/200 mL premix     1,000 mg 200 mL/hr over 60 Minutes Intravenous  Once 01/24/15 1349 01/24/15 2301   01/21/15 2000  vancomycin (VANCOCIN) 1,500 mg in sodium chloride 0.9 % 500 mL IVPB  Status:  Discontinued     1,500 mg 250 mL/hr over 120 Minutes Intravenous Every 24 hours 01/21/15  1048 01/23/15 1726   01/20/15 2000  vancomycin (VANCOCIN) 1,250 mg in sodium chloride 0.9 % 250 mL IVPB  Status:  Discontinued     1,250 mg 166.7 mL/hr over 90 Minutes Intravenous Every 24 hours 01/19/15 1945 01/20/15 0843   01/20/15 2000  vancomycin (VANCOCIN) 1,250 mg in sodium chloride 0.9 % 250 mL IVPB  Status:  Discontinued     1,250 mg 166.7 mL/hr over 90 Minutes Intravenous Every 24 hours 01/20/15 1519 01/21/15 1048   01/20/15 0000  piperacillin-tazobactam (ZOSYN) IVPB 3.375 g  Status:  Discontinued     3.375 g 12.5 mL/hr over 240 Minutes Intravenous Every 8 hours 01/19/15 1946 01/29/15 0845   01/19/15 1715  vancomycin (VANCOCIN) 2,500 mg in sodium chloride 0.9 % 500 mL IVPB     2,500 mg 250 mL/hr over 120 Minutes Intravenous  Once 01/19/15 1707 01/19/15 2159   01/19/15 1715  piperacillin-tazobactam (ZOSYN) IVPB 3.375 g     3.375 g 100 mL/hr over 30 Minutes Intravenous  Once 01/19/15 1707 01/19/15 1900      Assessment/Plan: -Neuro - seems approp -Pulm - cont pulm toilet, OOB, to chair -CV - bp ok. Hep gtt for PAF.  -Heme - anemia, hgb stable so far -GI- poor po, moderate PCMN, cont resource shake, discussed importance of PO -Renal - AKI  on CKD; Cr up but making urine, appears to be responding to Lasix, cont foley for now, has UTI, urine cx pending; renal following - will defer duration/strenght of lasix to Renal & Triad, appreciate assist -ID - gallbladder hematoma - no org on G stain, mixed org on cultures, nothing predominant; at this point I'm really not in favor of continuing abx for GB fossa hematoma given renal issues. Will consult ID to weigh; appears to have UTI, awaiting urine cx, this may explain persistent leukcytosis; per ID cont zosyn until urine cx back.  -Endo - cont SSI -F/E/N - hypokalemia/hypomagnesia - will replace potassium & magnesium; will need to watch intravascular volume status while on scheduled lasix  Cont PT/OT  At this point not in favor of  upsizing perc drain as I don't think it will need drain the hematoma. Will let hematoma resolve on own.   Appreciate triad assist  Severely deconditioned. Will need rehab/snf Not ready for placement given ongoing renal issue  [name]. [name], MD, FACS General, Bariatric, & Minimally Invasive Surgery [facility], [place]   LOS: 13 days    [name] 02/01/2015

## 2015-02-01 NOTE — Progress Notes (Signed)
Inpatient Diabetes Program Recommendations  AACE/ADA: New Consensus Statement on Inpatient Glycemic Control (2013)  Target Ranges:  Prepandial:   less than 140 mg/dL      Peak postprandial:   less than 180 mg/dL (1-2 hours)      Critically ill patients:  140 - 180 mg/dL    Results for [name], [name] (MRN [id number]) as of 02/01/2015 10:06  Ref. Range 01/31/2015 00:22 01/31/2015 04:27 01/31/2015 07:49 01/31/2015 12:03 01/31/2015 16:34 01/31/2015 20:09  Glucose-Capillary Latest Ref Range: 65-99 mg/dL 409 (H) 811 (H) 914 (H) 229 (H) 219 (H) 208 (H)    Results for [name], [name] (MRN [id number]) as of 02/01/2015 10:06  Ref. Range 02/01/2015 00:35 02/01/2015 04:50 02/01/2015 08:18  Glucose-Capillary Latest Ref Range: 65-99 mg/dL 086 (H) 578 (H) 469 (H)    Home DM Meds: Lantus 40 units QHS       Victoza 1.2 mg daily       Metformin 500 mg bid  Current DM Orders: Lantus 25 units QHS            Novolog Moderate SSI (0-15 units) Q4 hours     -Patient having elevated glucose levels.  -Only getting a portion of his home dose of Lantus.    MD- Please consider the following in-hospital insulin adjustments:  1. Increase Lantus to 35 units QHS  2. Change Novolog Moderate SSI to TID AC + HS (currently ordered as Q4 hours)     Will follow Ambrose Finland RN, MSN, CDE Diabetes Coordinator Inpatient Glycemic Control Team Team Pager: [phone] (8a-5p)

## 2015-02-01 NOTE — Progress Notes (Signed)
PHARMACY - HEPARIN (brief note)  Patient on IV heparin @ 1800 units/hr for AFib Heparin level = 0.27 (goal 0.3-0.7)  See earlier note from 8/15 for full details  Plan:  Increase heparin to 1950 units/hr            Check heparin level in 8 hrs  Terrilee Files, PharmD

## 2015-02-02 ENCOUNTER — Inpatient Hospital Stay (HOSPITAL_COMMUNITY): Payer: Medicare Other

## 2015-02-02 DIAGNOSIS — T148XXA Other injury of unspecified body region, initial encounter: Secondary | ICD-10-CM

## 2015-02-02 DIAGNOSIS — B3749 Other urogenital candidiasis: Secondary | ICD-10-CM | POA: Diagnosis present

## 2015-02-02 DIAGNOSIS — T888XXS Other specified complications of surgical and medical care, not elsewhere classified, sequela: Secondary | ICD-10-CM

## 2015-02-02 DIAGNOSIS — L304 Erythema intertrigo: Secondary | ICD-10-CM

## 2015-02-02 DIAGNOSIS — E1165 Type 2 diabetes mellitus with hyperglycemia: Secondary | ICD-10-CM

## 2015-02-02 DIAGNOSIS — I482 Chronic atrial fibrillation: Secondary | ICD-10-CM

## 2015-02-02 LAB — CBC WITH DIFFERENTIAL/PLATELET
BASOS ABS: 0 10*3/uL (ref 0.0–0.1)
BASOS PCT: 0 % (ref 0–1)
EOS ABS: 0.1 10*3/uL (ref 0.0–0.7)
EOS PCT: 1 % (ref 0–5)
HCT: 24.1 % — ABNORMAL LOW (ref 39.0–52.0)
Hemoglobin: 7.6 g/dL — ABNORMAL LOW (ref 13.0–17.0)
Lymphocytes Relative: 7 % — ABNORMAL LOW (ref 12–46)
Lymphs Abs: 0.9 10*3/uL (ref 0.7–4.0)
MCH: 28.1 pg (ref 26.0–34.0)
MCHC: 31.5 g/dL (ref 30.0–36.0)
MCV: 89.3 fL (ref 78.0–100.0)
Monocytes Absolute: 1.1 10*3/uL — ABNORMAL HIGH (ref 0.1–1.0)
Monocytes Relative: 8 % (ref 3–12)
Neutro Abs: 11.3 10*3/uL — ABNORMAL HIGH (ref 1.7–7.7)
Neutrophils Relative %: 84 % — ABNORMAL HIGH (ref 43–77)
PLATELETS: 378 10*3/uL (ref 150–400)
RBC: 2.7 MIL/uL — AB (ref 4.22–5.81)
RDW: 16.3 % — ABNORMAL HIGH (ref 11.5–15.5)
WBC: 13.4 10*3/uL — AB (ref 4.0–10.5)

## 2015-02-02 LAB — GLUCOSE, CAPILLARY
GLUCOSE-CAPILLARY: 165 mg/dL — AB (ref 65–99)
GLUCOSE-CAPILLARY: 189 mg/dL — AB (ref 65–99)
GLUCOSE-CAPILLARY: 233 mg/dL — AB (ref 65–99)
Glucose-Capillary: 142 mg/dL — ABNORMAL HIGH (ref 65–99)
Glucose-Capillary: 161 mg/dL — ABNORMAL HIGH (ref 65–99)
Glucose-Capillary: 168 mg/dL — ABNORMAL HIGH (ref 65–99)
Glucose-Capillary: 170 mg/dL — ABNORMAL HIGH (ref 65–99)

## 2015-02-02 LAB — BASIC METABOLIC PANEL
ANION GAP: 8 (ref 5–15)
BUN: 50 mg/dL — AB (ref 6–20)
CO2: 27 mmol/L (ref 22–32)
Calcium: 8.5 mg/dL — ABNORMAL LOW (ref 8.9–10.3)
Chloride: 101 mmol/L (ref 101–111)
Creatinine, Ser: 3.94 mg/dL — ABNORMAL HIGH (ref 0.61–1.24)
GFR calc Af Amer: 17 mL/min — ABNORMAL LOW (ref >60)
GFR, EST NON AFRICAN AMERICAN: 14 mL/min — AB (ref >60)
Glucose, Bld: 187 mg/dL — ABNORMAL HIGH (ref 65–99)
POTASSIUM: 3.4 mmol/L — AB (ref 3.5–5.1)
SODIUM: 136 mmol/L (ref 135–145)

## 2015-02-02 LAB — MAGNESIUM: Magnesium: 1.6 mg/dL — ABNORMAL LOW (ref 1.7–2.4)

## 2015-02-02 LAB — IRON AND TIBC
Iron: 20 ug/dL — ABNORMAL LOW (ref 45–182)
Saturation Ratios: 13 % — ABNORMAL LOW (ref 17.9–39.5)
TIBC: 151 ug/dL — AB (ref 250–450)
UIBC: 131 ug/dL

## 2015-02-02 LAB — HEPARIN LEVEL (UNFRACTIONATED): HEPARIN UNFRACTIONATED: 0.32 [IU]/mL (ref 0.30–0.70)

## 2015-02-02 LAB — FERRITIN: Ferritin: 972 ng/mL — ABNORMAL HIGH (ref 24–336)

## 2015-02-02 LAB — PHOSPHORUS: PHOSPHORUS: 3.7 mg/dL (ref 2.5–4.6)

## 2015-02-02 MED ORDER — ACETAMINOPHEN 325 MG PO TABS: 650.0000 mg | ORAL_TABLET | Freq: Four times a day (QID) | ORAL | Status: DC | PRN

## 2015-02-02 MED ORDER — POTASSIUM CHLORIDE CRYS ER 20 MEQ PO TBCR
40.0000 meq | EXTENDED_RELEASE_TABLET | Freq: Three times a day (TID) | ORAL | Status: AC
  Administered 2015-02-02 (×2): 40 meq via ORAL
  Filled 2015-02-02 (×3): qty 2

## 2015-02-02 MED ORDER — LIDOCAINE HCL 2 % EX GEL
1.0000 "application " | Freq: Once | CUTANEOUS | Status: DC
  Filled 2015-02-02: qty 5

## 2015-02-02 MED ORDER — FLUCONAZOLE 100 MG PO TABS
100.0000 mg | ORAL_TABLET | Freq: Every day | ORAL | Status: DC
  Administered 2015-02-02: 100 mg via ORAL
  Filled 2015-02-02: qty 1

## 2015-02-02 MED ORDER — FLUCONAZOLE 100 MG PO TABS
100.0000 mg | ORAL_TABLET | ORAL | Status: AC
  Administered 2015-02-02 – 2015-02-08 (×7): 100 mg via ORAL
  Filled 2015-02-02 (×7): qty 1

## 2015-02-02 MED ORDER — LIDOCAINE HCL 2 % EX GEL
1.0000 "application " | Freq: Once | CUTANEOUS | Status: AC
  Administered 2015-02-02: 1 via URETHRAL
  Filled 2015-02-02: qty 10

## 2015-02-02 MED ORDER — DARBEPOETIN ALFA 200 MCG/0.4ML IJ SOSY
200.0000 ug | PREFILLED_SYRINGE | INTRAMUSCULAR | Status: DC
  Administered 2015-02-02 – 2015-02-23 (×3): 200 ug via SUBCUTANEOUS
  Filled 2015-02-02 (×6): qty 0.4

## 2015-02-02 MED ORDER — SODIUM CHLORIDE 0.9 % IV SOLN
510.0000 mg | INTRAVENOUS | Status: AC
  Administered 2015-02-02 – 2015-02-05 (×2): 510 mg via INTRAVENOUS
  Filled 2015-02-02 (×2): qty 17

## 2015-02-02 MED ORDER — VITAMINS A & D EX OINT
TOPICAL_OINTMENT | CUTANEOUS | Status: AC
  Administered 2015-02-02: 1 via OROMUCOSAL
  Filled 2015-02-02: qty 5

## 2015-02-02 MED ORDER — OXYCODONE HCL 5 MG PO TABS
2.5000 mg | ORAL_TABLET | ORAL | Status: DC | PRN
  Administered 2015-02-04 – 2015-02-07 (×3): 2.5 mg via ORAL
  Filled 2015-02-02 (×3): qty 1

## 2015-02-02 MED ORDER — INSULIN GLARGINE 100 UNIT/ML ~~LOC~~ SOLN
35.0000 [IU] | Freq: Every day | SUBCUTANEOUS | Status: DC
  Administered 2015-02-02 – 2015-02-11 (×10): 35 [IU] via SUBCUTANEOUS
  Filled 2015-02-02 (×10): qty 0.35
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
## 2015-02-02 NOTE — Progress Notes (Signed)
Physical Therapy Treatment Patient Details Name: Larry [name] MRN: [id number] DOB: 1946/02/01 Today's Date: 02/02/2015    History of Present Illness 69 year old male w/ CAF on elliquis recently underwent lap chole on 7/14. Was discharged to home w/drain. Drain removed about a week before presentation to ER. Admitted directly  from [facility] on 8/3 w/ septic shock which was likely due to infected hematoma in the GB fossa.  new perc drain placed on 8/4, when he remained hypotensive    PT Comments    Progressing slowly, limited by discomfort of edema. Recommend SNF .  Follow Up Recommendations  SNF;Supervision/Assistance - 24 hour     Equipment Recommendations  Rolling walker with 5" wheels    Recommendations for Other Services       Precautions / Restrictions Precautions Precautions: Fall Precaution Comments: 02, drain    Mobility  Bed Mobility   Bed Mobility: Sit to Supine       Sit to supine: Max assist;+2 for physical assistance;+2 for safety/equipment   General bed mobility comments: assist to get legs onto bed.  Transfers Overall transfer level: Needs assistance Equipment used: Rolling walker (2 wheeled) Transfers: Sit to/from Stand Sit to Stand: Mod assist;+2 physical assistance;+2 safety/equipment;From elevated surface         General transfer comment: Assist to rise, stabilize, control descent. VCs safety, hand placement. Some difficulty maintain grip on walker. likes PT to hold Hand onto RW  Ambulation/Gait Ambulation/Gait assistance: Mod assist;+2 physical assistance;+2 safety/equipment Ambulation Distance (Feet): 20 Feet Assistive device: Rolling walker (2 wheeled) Gait Pattern/deviations: Step-to pattern;Step-through pattern;Decreased step length - right;Wide base of support;Trunk flexed     General Gait Details: Assist to stabilize pt and maneuver with RW. Followed closely with recliner. Fatigues very quickly. sats >90%   Stairs             Wheelchair Mobility    Modified Rankin (Stroke Patients Only)       Balance                                    Cognition Arousal/Alertness: Awake/alert                          Exercises      General Comments        Pertinent Vitals/Pain Pain Score: 6  Pain Location: scrotum Pain Descriptors / Indicators: Tender Pain Intervention(s): Limited activity within patient's tolerance;Monitored during session;Repositioned    Home Living                      Prior Function            PT Goals (current goals can now be found in the care plan section) Progress towards PT goals: Progressing toward goals    Frequency  Min 3X/week    PT Plan      Co-evaluation             End of Session Equipment Utilized During Treatment: Oxygen Activity Tolerance: Patient limited by fatigue;Patient limited by pain Patient left: in bed;with call bell/phone within reach;with nursing/sitter in room     Time: 1191-4782 PT Time Calculation (min) (ACUTE ONLY): 30 min  Charges:  $Gait Training: 23-37 mins                    G Codes:  Rada Hay 02/02/2015, 2:26 PM Blanchard Kelch PT [phone]

## 2015-02-02 NOTE — Progress Notes (Signed)
[facility]    Subjective: He was not happy to hear that his foley catheter would need to be changed   Antibiotics:  Anti-infectives    Start     Dose/Rate Route Frequency Ordered Stop   02/02/15 1800  fluconazole (DIFLUCAN) tablet 100 mg  Status:  Discontinued     100 mg Oral Daily 02/02/15 1545 02/02/15 1901   01/31/15 1000  fluconazole (DIFLUCAN) tablet 100 mg  Status:  Discontinued     100 mg Oral Daily 01/31/15 0727 02/02/15 1313   01/29/15 1700  piperacillin-tazobactam (ZOSYN) IVPB 2.25 g     2.25 g 100 mL/hr over 30 Minutes Intravenous Every 8 hours 01/29/15 0845     01/24/15 2200  vancomycin (VANCOCIN) IVPB 1000 mg/200 mL premix     1,000 mg 200 mL/hr over 60 Minutes Intravenous  Once 01/24/15 1349 01/24/15 2301   01/21/15 2000  vancomycin (VANCOCIN) 1,500 mg in sodium chloride 0.9 % 500 mL IVPB  Status:  Discontinued     1,500 mg 250 mL/hr over 120 Minutes Intravenous Every 24 hours 01/21/15 1048 01/23/15 1726   01/20/15 2000  vancomycin (VANCOCIN) 1,250 mg in sodium chloride 0.9 % 250 mL IVPB  Status:  Discontinued     1,250 mg 166.7 mL/hr over 90 Minutes Intravenous Every 24 hours 01/19/15 1945 01/20/15 0843   01/20/15 2000  vancomycin (VANCOCIN) 1,250 mg in sodium chloride 0.9 % 250 mL IVPB  Status:  Discontinued     1,250 mg 166.7 mL/hr over 90 Minutes Intravenous Every 24 hours 01/20/15 1519 01/21/15 1048   01/20/15 0000  piperacillin-tazobactam (ZOSYN) IVPB 3.375 g  Status:  Discontinued     3.375 g 12.5 mL/hr over 240 Minutes Intravenous Every 8 hours 01/19/15 1946 01/29/15 0845   01/19/15 1715  vancomycin (VANCOCIN) 2,500 mg in sodium chloride 0.9 % 500 mL IVPB     2,500 mg 250 mL/hr over 120 Minutes Intravenous  Once 01/19/15 1707 01/19/15 2159   01/19/15 1715  piperacillin-tazobactam (ZOSYN) IVPB 3.375 g     3.375 g 100 mL/hr over 30 Minutes Intravenous  Once 01/19/15 1707 01/19/15 1900       Medications: Scheduled Meds: . antiseptic oral rinse  7 mL Mouth Rinse BID  . darbepoetin (ARANESP) injection - NON-DIALYSIS  200 mcg Subcutaneous Q Wed-1800  . diphenhydrAMINE  12.5 mg Intravenous Once  . feeding supplement  1 Container Oral TID BM  . feeding supplement (PRO-STAT SUGAR FREE 64)  30 mL Oral BID  . ferumoxytol  510 mg Intravenous Q72H  . furosemide  80 mg Intravenous 3 times per day  . insulin aspart  0-15 Units Subcutaneous 6 times per day  . insulin glargine  35 Units Subcutaneous QHS  . magnesium oxide  400 mg Oral BID  . nystatin   Topical TID  . ondansetron  8 mg Oral BID  . piperacillin-tazobactam (ZOSYN)  IV  2.25 g Intravenous Q8H  . potassium chloride  40 mEq Oral TID   Continuous Infusions: . sodium chloride 10 mL/hr at 01/31/15 0508  . heparin 2,100 Units/hr (02/02/15 1800)   PRN Meds:.acetaminophen, fentaNYL (SUBLIMAZE) injection, levalbuterol, LORazepam, ondansetron **OR** ondansetron (ZOFRAN) IV, oxyCODONE    Objective: Weight change:   Intake/Output Summary (Last 24 hours) at 02/02/15 1902 Last data filed at 02/02/15 1830  Gross per 24 hour  Intake   1138 ml  Output   2950 ml  Net  -1812 ml   Blood pressure 124/63, pulse 95, temperature 98.7 F (37.1 C), temperature source Oral, resp. rate 26, height 5\' 6"  (1.676 m), weight 280 lb 13.9 oz (127.4 kg), SpO2 99 %. Temp:  [98 F (36.7 C)-98.7 F (37.1 C)] 98.7 F (37.1 C) (08/17 1600) Pulse Rate:  [93-98] 95 (08/17 1637) Resp:  [17-47] 26 (08/17 1637) BP: (111-129)/(50-67) 124/63 mmHg (08/17 1637) SpO2:  [93 %-99 %] 99 % (08/17 1637) Weight:  [280 lb 13.9 oz (127.4 kg)] 280 lb 13.9 oz (127.4 kg) (08/17 0000)  Physical Exam: General: Alert and awake, oriented x3, not in any acute distress. HEENT: anicteric sclera, , EOMI, IJ in place CVS regular rate, normal r,  no murmur rubs or gallops Chest: clear to auscultation bilaterally, anteriorly Abdomen: tender in RUQ, +bs, drain with  bloody material Extremities: no  clubbing or edema noted bilaterally Skin: no rashes Neuro: nonfocal  CBC: CBC Latest Ref Rng 02/02/2015 02/01/2015 01/31/2015  WBC 4.0 - 10.5 K/uL 13.4(H) 14.6(H) 13.4(H)  Hemoglobin 13.0 - 17.0 g/dL 7.6(L) 7.6(L) 7.6(L)  Hematocrit 39.0 - 52.0 % 24.1(L) 23.8(L) 23.8(L)  Platelets 150 - 400 K/uL 378 368 338       BMET  Recent Labs  02/01/15 0418 02/02/15 0352  NA 136 136  K 3.2* 3.4*  CL 101 101  CO2 27 27  GLUCOSE 200* 187*  BUN 48* 50*  CREATININE 3.92* 3.94*  CALCIUM 8.4* 8.5*     Liver Panel  No results for input(s): PROT, ALBUMIN, AST, ALT, ALKPHOS, BILITOT, BILIDIR, IBILI in the last 72 hours.     Sedimentation Rate No results for input(s): ESRSEDRATE in the last 72 hours. C-Reactive Protein No results for input(s): CRP in the last 72 hours.  Micro Results: Recent Results (from the past 720 hour(s))  Culture, routine-abscess     Status: None   Collection Time: 01/20/15  1:00 PM  Result Value Ref Range Status   Specimen Description ABSCESS GALL BLADDER FOSSA  Final   Special Requests Normal  Final   Gram Stain   Final    ABUNDANT WBC PRESENT,BOTH PMN AND MONONUCLEAR NO SQUAMOUS EPITHELIAL CELLS SEEN NO ORGANISMS SEEN Performed at [facility]    Culture   Final    MULTIPLE ORGANISMS PRESENT, NONE PREDOMINANT Note: NO STAPHYLOCOCCUS AUREUS ISOLATED NO GROUP A STREP (S.PYOGENES) ISOLATED Performed at [facility]    Report Status 01/23/2015 FINAL  Final  Anaerobic culture     Status: None   Collection Time: 01/20/15  1:00 PM  Result Value Ref Range Status   Specimen Description ABSCESS GALL BLADDER FOSSA  Final   Special Requests Normal  Final   Gram Stain   Final    ABUNDANT WBC PRESENT,BOTH PMN AND MONONUCLEAR NO SQUAMOUS EPITHELIAL CELLS SEEN NO ORGANISMS SEEN Performed at [facility]    Culture   Final    NO ANAEROBES ISOLATED Performed at [facility]    Report Status  01/26/2015 FINAL  Final  MRSA PCR Screening     Status: None   Collection Time: 01/20/15  1:39 PM  Result Value Ref Range Status   MRSA by PCR NEGATIVE NEGATIVE Final    Comment:        The GeneXpert MRSA Assay (FDA approved for NASAL specimens only), is one component of a comprehensive MRSA colonization surveillance program. It is not intended to diagnose MRSA infection nor to guide or monitor treatment for MRSA infections. Performed at  [facility]   Culture, blood (routine x 2)     Status: None   Collection Time: 01/20/15  1:50 PM  Result Value Ref Range Status   Specimen Description BLOOD RIGHT ARM  Final   Special Requests IN PEDIATRIC BOTTLE 4CC  Final   Culture   Final    NO GROWTH 5 DAYS Performed at [facility]    Report Status 01/25/2015 FINAL  Final  Culture, blood (routine x 2)     Status: None   Collection Time: 01/20/15  1:55 PM  Result Value Ref Range Status   Specimen Description BLOOD RIGHT HAND  Final   Special Requests IN PEDIATRIC BOTTLE 4CC  Final   Culture   Final    NO GROWTH 5 DAYS Performed at [facility]    Report Status 01/25/2015 FINAL  Final  Urine culture     Status: None   Collection Time: 01/30/15 11:40 AM  Result Value Ref Range Status   Specimen Description URINE, RANDOM  Final   Special Requests NONE  Final   Culture   Final    >=100,000 COLONIES/mL YEAST Performed at [facility]    Report Status 02/01/2015 FINAL  Final    Studies/Results: No results found.    Assessment/Plan:  Principal Problem:   Shock circulatory Active Problems:   Diabetes mellitus without complication   HTN (hypertension)   Paroxysmal atrial fibrillation   Obesity   Intra-abdominal abscess   Paroxysmal a-fib   Sepsis   Acute respiratory distress   Intra-abdominal hematoma   Acute on chronic kidney failure   Acute blood loss anemia   Catheter-associated urinary tract infection   Abdominal abscess    Intra-abdominal infection    Larry [name] is a 69 y.o. male sp cholectystectomy with gall bladder fossa infected hematoma, sp drain placement and culture from this yielding mx organism but no staph, strep, on vanco/zosyn with persistently elevated WBC and with funguria  #1 Gall bladder fossa hematom with superinfection:  I suspect THIS is what is driving his WBC to stay up the hematoma did NOT change in size between placement of the drain and CT on the 11th  He is getting repeat imaging tomorrow  If he is getting new drains would send aspirate done with placement of new drain for culture  Continue zosyn for now  I WOULD favor having him on protracted course of antibiotics  Perhaps a fresh culture could hep guide therapy.  I dont think it necessarily has to be IV either. I would be OK with augmentin potentially  #2 Funguria: pt REALLy did not want foley replaced even after explanation but RN staff were thankfully able to do this  This should CURE his funguria  #3 intetrigo: will leave on fluconazole for another 7 days then dc  Dr. Ninetta Lights taking over the service for Thursday and Friday.     LOS: 14 days   Acey Lav 02/02/2015, 7:02 PM

## 2015-02-02 NOTE — Progress Notes (Deleted)
[facility] ADULT ICU REPLACEMENT PROTOCOL FOR AM LAB REPLACEMENT ONLY  The patient does apply for the [facility] Adult ICU Electrolyte Replacment Protocol based on the criteria listed below:   1. Is GFR >/= 40 ml/min? Yes.    Patient's GFR today is >60 2. Is urine output >/= 0.5 ml/kg/hr for the last 6 hours? Yes.   Patient's UOP is 1.8 ml/kg/hr 3. Is BUN < 60 mg/dL? Yes.    Patient's BUN today is 6 4. Abnormal electrolyte K3.1 5. Ordered repletion with: per protocol 6. If a panic level lab has been reported, has the CCM MD in charge been notified? Yes.  .   Physician:  Feliz Beam 02/02/2015 6:33 AM

## 2015-02-02 NOTE — Progress Notes (Addendum)
Patient ID: Larry [name], male   DOB: [birth year], 69 y.o.   MRN: [id number] [facility] PROGRESS NOTE  HEYWARD DOUTHIT WUJ:[id number] DOB: January 03, 1946 DOA: 01/19/2015 PCP: [name]  Brief narrative:    69 year old male with past medical history of atrial fibrillation (on anticoagulation with Eliquis), hypertension, dyslipidemia, diabetes. Patient underwent laparoscopic cholecystectomy on 01/03/2015. He subsequently developed gallbladder fossa infected hematoma and is status post drain placement (cultures from this fluid analysis yielded mixed organisms). Patient presented to [facility] with septic shock and was then transferred to [facility] for management of infected hematoma. He has required pressor support on admission but has been off pressors since 01/21/2015.   Patient required 2 units of PRBC transfusion for increasing intraperitoneal hemorrhage on 01/23/2015.  Hospital course is complicated with yeast UTI as well as acute renal failure. Both, nephrology and ID are following as well. [facility] consulted for DM management.   Barrier to discharge: Plan to evaluate for larger drain for hematoma per surgery.   Assessment/Plan:    Principal Problem: Septic shock secondary to complicated cholecystitis status post cholecystectomy 01/03/2015 with infected hematoma / Leukocytosis - Septic shock on admission secondary to infected gallbladder fossa hematoma  - Pt required pressor support on admission thorough 01/21/2015 - Cultures from fluid analysis with mixed organisms - Currently on zosyn  - ID is following - Plan to have larger drain placed for hematoma per surgery as well as repeat CT scan tomorrow 02/03/2015  Active Problems: Yeast UTI - Pt is on fluconazole 100 mg daily  Acute hypoxic respiratory failure secondary to volume overload and anasarca - Clinically fluid overloaded with LE pitting edema but no pulmonary edema on CXR 2015/02/20 - Volume overload likely from IV fluids received initially for  septic shock - Now on lasix 80 mg IV Q 8 hours. Stable respiratory status - Last 2 D ECHO in 09/2014 with normal EF - Continue daily weight and strict intake and output - Negative 2 L fluid balance in past 24 hours  - The weight in past 72 hours: 125.3 kg --> 122.2 kg --> 127.4 kg (? Not sure if taken consistently the same way)  Acute renal failure - Likely from ATN associated with features of sepsis during the initial phase of hospitalization.  - Has excellent urinary output in response to furosemide.  - Appreciate renal following and their recommendations   Hypokalemia/hypomagnesemia - Secondary to lasix - Being supplemented  Anemia of chronic disease / Acute blood loss anemia - Due to anticoagulation and hematoma - Since pt on heparin IV will continue to monitor for bleed - Hemoglobin is stable at 7.6 over past 2-3 days  - Of note, has received 2 units PRBC since admission 01/23/2015   History of atrial fibrillation - CHADS vasc score at least 3 - On eliquis at home but now on IV heparin - Rate controlled   Diabetes mellitus type 2, uncontrolled - A1c 8.2 indicating poor glycemic control - Pt currently on Lantus 25 units at bedtime and SSI  Morbid obesity - Body mass index is 45.35 kg/(m^2). - Nutrition consulted    DVT Prophylaxis  - IV heparin    Code Status: Full.  Family Communication:  plan of care discussed with the patient and his sister at the bedside Disposition Plan: Remains in SDU due to overall complexity of care  IV access:  Peripheral IV  Procedures and diagnostic studies:    Dg Chest Port 1 View February 20, 2015  Right IJ central line  with tip in right atrium again noted. No pulmonary edema. There is chronic elevation of the right hemidiaphragm with right basilar atelectasis.    Medical Consultants:  Surgery Nephrology Infectious disease  Other Consultants:  Diabetic coordinator Nutrition  PT/OT  IAnti-Infectives:   Diflucan Yetta Barre, MD  [facility] Pager [phone]  Time spent in minutes: 25 minutes  If 7PM-7AM, please contact night-coverage [email or web address] Password [email or web address] 02/02/2015, 11:20 AM   LOS: 14 days    HPI/Subjective: No acute overnight events. Patient reports pain in RUQ of the abdomen to be 8/10 in intensity.   Objective: Filed Vitals:   02/02/15 0400 02/02/15 0500 02/02/15 0800 02/02/15 0808  BP: 114/50   111/51  Pulse: 96 93  98  Temp: 98 F (36.7 C)  98.4 F (36.9 C)   TempSrc: Axillary  Oral   Resp: 33 30  22  Height:      Weight:      SpO2: 93% 93%  95%    Intake/Output Summary (Last 24 hours) at 02/02/15 1120 Last data filed at 02/02/15 1051  Gross per 24 hour  Intake   1145 ml  Output   3131 ml  Net  -1986 ml    Exam:   General:  Pt is alert, follows commands appropriately, not in acute distress  Cardiovascular: Regular rate and rhythm, S1/S2, no murmurs  Respiratory: Clear to auscultation bilaterally, no wheezing, no crackles, no rhonchi  Abdomen: drain in place on right side of abdomen, tender to palpation on the right side of abdomen, no guarding   Extremities: +1-2 LE pitting edema, pulses DP and PT palpable bilaterally  Neuro: Grossly nonfocal  Data Reviewed: Basic Metabolic Panel:  Recent Labs Lab 01/29/15 0517 01/30/15 0440 01/31/15 0500 02/01/15 0418 02/02/15 0352  NA 140 130* 137 136 136  K 3.5 3.2* 3.2* 3.2* 3.4*  CL 108 101 103 101 101  CO2 26 24 26 27 27   GLUCOSE 173* 389* 170* 200* 187*  BUN 50* 46* 48* 48* 50*  CREATININE 3.69* 3.60* 3.80* 3.92* 3.94*  CALCIUM 8.5* 8.0* 8.4* 8.4* 8.5*  MG 1.8 1.7 1.6* 1.5* 1.6*  PHOS 3.2 3.2 3.8 3.9 3.7   Liver Function Tests:  Recent Labs Lab 01/27/15 0345 01/28/15 0320 01/29/15 0517 01/30/15 0440  AST 17 16 19 16   ALT 22 19 16* 14*  ALKPHOS 77 70 73 65  BILITOT 1.7* 1.7* 1.5* 1.3*  PROT 6.1* 6.0* 5.9* 5.6*  ALBUMIN 2.3* 2.2* 2.1* 2.1*   No results for input(s): LIPASE, AMYLASE in  the last 168 hours. No results for input(s): AMMONIA in the last 168 hours. CBC:  Recent Labs Lab 01/29/15 0517  01/30/15 0440 01/30/15 1800 01/31/15 0500 02/01/15 0418 02/02/15 0352  WBC 12.5*  --  11.9*  --  13.4* 14.6* 13.4*  NEUTROABS 10.4*  --  9.8*  --  11.5* 12.2* 11.3*  HGB 7.5*  < > 7.3* 7.5* 7.6* 7.6* 7.6*  HCT 23.7*  < > 22.3* 23.7* 23.8* 23.8* 24.1*  MCV 90.5  --  91.0  --  89.5 89.5 89.3  PLT 342  --  336  --  338 368 378  < > = values in this interval not displayed. Cardiac Enzymes: No results for input(s): CKTOTAL, CKMB, CKMBINDEX, TROPONINI in the last 168 hours. BNP: Invalid input(s): POCBNP CBG:  Recent Labs Lab 02/01/15 1600 02/01/15 2022 02/02/15 0023 02/02/15 0336 02/02/15 0753  GLUCAP 172* 194* 233* 165* 161*  Urine culture     Status: None   Collection Time: 01/30/15 11:40 AM  Result Value Ref Range Status   Specimen Description URINE, RANDOM  Final   Special Requests NONE  Final   Culture   Final    >=100,000 COLONIES/mL YEAST Performed at [facility]    Report Status 02/01/2015 FINAL  Final     Scheduled Meds: . feeding supplement  1 Container Oral TID BM  . feeding supplement (PRO-STAT SUGAR FREE 64)  30 mL Oral BID  . fluconazole  100 mg Oral Daily  . furosemide  80 mg Intravenous 3 times per day  . insulin aspart  0-15 Units Subcutaneous 6 times per day  . insulin glargine  25 Units Subcutaneous QHS  . magnesium oxide  400 mg Oral BID  . nystatin   Topical TID  . ondansetron  8 mg Oral BID  . piperacillin-tazobactam (ZOSYN)  IV  2.25 g Intravenous Q8H  . potassium chloride  40 mEq Oral TID   Continuous Infusions: . sodium chloride 10 mL/hr at 01/31/15 0508  . heparin 2,100 Units/hr (02/02/15 1000)

## 2015-02-02 NOTE — Progress Notes (Signed)
Subjective: No new real issues. Eating about same amount. No n/v. Walked in room. Would like to try walking to unit desk. Gets real fatigued with activity. Still having some rt sided discomfort  Objective: Vital signs in last 24 hours: Temp:  [98 F (36.7 C)-99.1 F (37.3 C)] 98 F (36.7 C) (08/17 0400) Pulse Rate:  [93-98] 98 (08/17 0808) Resp:  [14-47] 22 (08/17 0808) BP: (93-129)/(47-63) 111/51 mmHg (08/17 0808) SpO2:  [93 %-99 %] 95 % (08/17 0808) Weight:  [127.4 kg (280 lb 13.9 oz)] 127.4 kg (280 lb 13.9 oz) (08/17 0000) Last BM Date: 02/01/15  Intake/Output from previous day: 08/16 0701 - 08/17 0700 In: 1283 [P.O.:420; I.V.:653; IV Piggyback:200] Out: 3487 [Urine:3475; Drains:10; Stool:2] Intake/Output this shift:   Alert, sitting in chair; pale cta ant  Reg Soft, obese, nd Improving b/l LE pitting edema - maybe 1+   Lab Results:   Recent Labs  02/01/15 0418 02/02/15 0352  WBC 14.6* 13.4*  HGB 7.6* 7.6*  HCT 23.8* 24.1*  PLT 368 378   BMET  Recent Labs  02/01/15 0418 02/02/15 0352  NA 136 136  K 3.2* 3.4*  CL 101 101  CO2 27 27  GLUCOSE 200* 187*  BUN 48* 50*  CREATININE 3.92* 3.94*  CALCIUM 8.4* 8.5*   PT/INR No results for input(s): LABPROT, INR in the last 72 hours. ABG No results for input(s): PHART, HCO3 in the last 72 hours.  Invalid input(s): PCO2, PO2  Studies/Results: No results found.  Anti-infectives: Anti-infectives    Start     Dose/Rate Route Frequency Ordered Stop   01/31/15 1000  fluconazole (DIFLUCAN) tablet 100 mg     100 mg Oral Daily 01/31/15 0727     01/29/15 1700  piperacillin-tazobactam (ZOSYN) IVPB 2.25 g     2.25 g 100 mL/hr over 30 Minutes Intravenous Every 8 hours 01/29/15 0845     01/24/15 2200  vancomycin (VANCOCIN) IVPB 1000 mg/200 mL premix     1,000 mg 200 mL/hr over 60 Minutes Intravenous  Once 01/24/15 1349 01/24/15 2301   01/21/15 2000  vancomycin (VANCOCIN) 1,500 mg in sodium chloride 0.9 % 500  mL IVPB  Status:  Discontinued     1,500 mg 250 mL/hr over 120 Minutes Intravenous Every 24 hours 01/21/15 1048 01/23/15 1726   01/20/15 2000  vancomycin (VANCOCIN) 1,250 mg in sodium chloride 0.9 % 250 mL IVPB  Status:  Discontinued     1,250 mg 166.7 mL/hr over 90 Minutes Intravenous Every 24 hours 01/19/15 1945 01/20/15 0843   01/20/15 2000  vancomycin (VANCOCIN) 1,250 mg in sodium chloride 0.9 % 250 mL IVPB  Status:  Discontinued     1,250 mg 166.7 mL/hr over 90 Minutes Intravenous Every 24 hours 01/20/15 1519 01/21/15 1048   01/20/15 0000  piperacillin-tazobactam (ZOSYN) IVPB 3.375 g  Status:  Discontinued     3.375 g 12.5 mL/hr over 240 Minutes Intravenous Every 8 hours 01/19/15 1946 01/29/15 0845   01/19/15 1715  vancomycin (VANCOCIN) 2,500 mg in sodium chloride 0.9 % 500 mL IVPB     2,500 mg 250 mL/hr over 120 Minutes Intravenous  Once 01/19/15 1707 01/19/15 2159   01/19/15 1715  piperacillin-tazobactam (ZOSYN) IVPB 3.375 g     3.375 g 100 mL/hr over 30 Minutes Intravenous  Once 01/19/15 1707 01/19/15 1900      Assessment/Plan: S/p interval lap cholecystectomy 7/18 GB fossa hematoma ARF on CKD Anemia Moderate PCMN UTI  Really appreciate triad, renal, ID assist  ID - need to ask ID how long they think pt may need IV abx? Fortunately pt is not having fevers and leukocytosis is stable. If they think he needs prolonged IV abx - will need to convert central line to PICC; defer yeast UTI to them  GB fossa hematoma - will repeat CT on Thursday. If no significant change in hematoma, will ask IR to upsize drain - i'm not sure larger drain will be effective in draining hematoma however spoke with Dr Ruthe Mannan in IR who thinks a larger drain will be more successful. If end up getting larger drain - will need to hold IV heparin   Anemia - will ask renal and medicine about whether a unit of prbc may be helpful for pt. hgb is stable and no signs of bleeding but maybe a little  symptomatic from anemia  Hypokalemia/hypomagnesia - replaced already  PT/OT Nutrition! Updated family at bedside  Larry [name]. Larry Campanile, MD, FACS General, Bariatric, & Minimally Invasive Surgery [facility], [place]   LOS: 14 days    Larry [name] 02/02/2015

## 2015-02-02 NOTE — Progress Notes (Signed)
Central line was placed 01-20-2015.  Dr. [name] aware.

## 2015-02-02 NOTE — Progress Notes (Signed)
Dr. Daryll Drown wants urinary catheter replaced.  When this urinary catheter was placed the urine sample results included yeast and bacteria.  Patient had severe urinary retention.  Patient will not allow urinary catheter to be placed without lidocaine jelly used at the insertion site.  Await response from MD to get the order for lidocaine jelly.

## 2015-02-02 NOTE — Progress Notes (Addendum)
Patient ID: Larry [name], male   DOB: 05-07-1946, 69 y.o.   MRN: [id number]  [facility] Progress Note   Assessment/ Plan:   1. Acute renal failure: appears to be ATN associated with features of sepsis during his initial phase of hospitalization. Maintains excellent urine output even after decreasing furosemide-creatinine appears to be approaching plateau phase and I anticipate recovery in the next few days. Until then, continue supportive management of blood pressure and minimization of nephro toxins. 2. Hypokalemia/hypomagnesemia: Secondary to brisk diuresis/urinary losses, continue supplementation with magnesium and potassium. 3. Gallbladder fossa abscess status post drain placement by interventional radiology-cultures negative and on empiric Zosyn-seen by infectious disease yesterday and repeat cultures checked 4. Anasarca/volume overload: Attempting diuretic therapy with net negative fluid balance overnight now 5. Anemia: Possibly associated overt losses and recent critical illness, will give intravenous Feraheme and start Aranesp  Subjective:   Reports that he continues to have discomfort from scrotal edema and was able to sit for about an hour before he started having excruciating pain. Now not in severe pain.    Objective:   BP 111/51 mmHg  Pulse 98  Temp(Src) 98.4 F (36.9 C) (Oral)  Resp 22  Ht  (1.676 m)  Wt 127.4 kg (280 lb 13.9 oz)  BMI 45.35 kg/m2  SpO2 95%  Intake/Output Summary (Last 24 hours) at 02/02/15 1337 Last data filed at 02/02/15 1300  Gross per 24 hour  Intake   1089 ml  Output   2780 ml  Net  -1691 ml   Weight change:   Physical Exam: Gen: Appears to be uncomfortable resting in bed, wife at bedside CVS: Pulse regular in rate and rhythm, S1 and S2 normal Resp: Decreased breath sounds over bases otherwise clear-no rales Abd: Soft, obese, right upper quadrant percutaneous drain in situ Ext: 2+ lower extremity edema as well as  scrotal/penile edema-improving  Imaging: No results found.  Labs: BMET  Recent Labs Lab 01/27/15 0345 01/28/15 0320 01/29/15 0517 01/30/15 0440 01/31/15 0500 02/01/15 0418 02/02/15 0352  NA 138 139 140 130* 137 136 136  K 3.6 3.4* 3.5 3.2* 3.2* 3.2* 3.4*  CL 105 107 108 101 103 101 101  CO2 GLUCOSE 168* 131* 173* 389* 170* 200* 187*  BUN 50* 50* 50* 46* 48* 48* 50*  CREATININE 3.51* 3.58* 3.69* 3.60* 3.80* 3.92* 3.94*  CALCIUM 8.3* 8.3* 8.5* 8.0* 8.4* 8.4* 8.5*  PHOS 3.6 3.4 3.2 3.2 3.8 3.9 3.7   CBC  Recent Labs Lab 01/30/15 0440 01/30/15 1800 01/31/15 0500 02/01/15 0418 02/02/15 0352  WBC 11.9*  --  13.4* 14.6* 13.4*  NEUTROABS 9.8*  --  11.5* 12.2* 11.3*  HGB 7.3* 7.5* 7.6* 7.6* 7.6*  HCT 22.3* 23.7* 23.8* 23.8* 24.1*  MCV 91.0  --  89.5 89.5 89.3  PLT 336  --  338 368 378    Medications:    . antiseptic oral rinse  7 mL Mouth Rinse BID  . diphenhydrAMINE  12.5 mg Intravenous Once  . feeding supplement  1 Container Oral TID BM  . feeding supplement (PRO-STAT SUGAR FREE 64)  30 mL Oral BID  . furosemide  80 mg Intravenous 3 times per day  . insulin aspart  0-15 Units Subcutaneous 6 times per day  . insulin glargine  35 Units Subcutaneous QHS  . magnesium oxide  400 mg Oral BID  . nystatin   Topical TID  . ondansetron  8 mg  Oral BID  . piperacillin-tazobactam (ZOSYN)  IV  2.25 g Intravenous Q8H  . potassium chloride  40 mEq Oral TID   Zetta Bills, MD 02/02/2015, 1:37 PM

## 2015-02-02 NOTE — Progress Notes (Signed)
Replaced urinary catheter.  Patient tolerated fair. Moderate anxiety.

## 2015-02-03 ENCOUNTER — Ambulatory Visit (HOSPITAL_COMMUNITY): Payer: Medicare Other

## 2015-02-03 ENCOUNTER — Inpatient Hospital Stay (HOSPITAL_COMMUNITY): Payer: Medicare Other

## 2015-02-03 DIAGNOSIS — J9601 Acute respiratory failure with hypoxia: Secondary | ICD-10-CM | POA: Diagnosis present

## 2015-02-03 DIAGNOSIS — IMO0002 Reserved for concepts with insufficient information to code with codable children: Secondary | ICD-10-CM | POA: Diagnosis present

## 2015-02-03 DIAGNOSIS — B3749 Other urogenital candidiasis: Secondary | ICD-10-CM

## 2015-02-03 DIAGNOSIS — N170 Acute kidney failure with tubular necrosis: Secondary | ICD-10-CM

## 2015-02-03 DIAGNOSIS — E46 Unspecified protein-calorie malnutrition: Secondary | ICD-10-CM

## 2015-02-03 DIAGNOSIS — D72829 Elevated white blood cell count, unspecified: Secondary | ICD-10-CM | POA: Diagnosis present

## 2015-02-03 LAB — BASIC METABOLIC PANEL
Anion gap: 9 (ref 5–15)
BUN: 49 mg/dL — AB (ref 6–20)
CALCIUM: 8.8 mg/dL — AB (ref 8.9–10.3)
CHLORIDE: 101 mmol/L (ref 101–111)
CO2: 27 mmol/L (ref 22–32)
CREATININE: 3.79 mg/dL — AB (ref 0.61–1.24)
GFR calc non Af Amer: 15 mL/min — ABNORMAL LOW (ref >60)
GFR, EST AFRICAN AMERICAN: 17 mL/min — AB (ref >60)
GLUCOSE: 136 mg/dL — AB (ref 65–99)
Potassium: 3.6 mmol/L (ref 3.5–5.1)
Sodium: 137 mmol/L (ref 135–145)

## 2015-02-03 LAB — CBC WITH DIFFERENTIAL/PLATELET
BASOS PCT: 0 % (ref 0–1)
Basophils Absolute: 0 10*3/uL (ref 0.0–0.1)
Eosinophils Absolute: 0.1 10*3/uL (ref 0.0–0.7)
Eosinophils Relative: 1 % (ref 0–5)
HEMATOCRIT: 24.9 % — AB (ref 39.0–52.0)
HEMOGLOBIN: 8.1 g/dL — AB (ref 13.0–17.0)
LYMPHS ABS: 0.9 10*3/uL (ref 0.7–4.0)
Lymphocytes Relative: 7 % — ABNORMAL LOW (ref 12–46)
MCH: 28.9 pg (ref 26.0–34.0)
MCHC: 32.5 g/dL (ref 30.0–36.0)
MCV: 88.9 fL (ref 78.0–100.0)
MONO ABS: 1.2 10*3/uL — AB (ref 0.1–1.0)
MONOS PCT: 9 % (ref 3–12)
NEUTROS ABS: 11.1 10*3/uL — AB (ref 1.7–7.7)
Neutrophils Relative %: 83 % — ABNORMAL HIGH (ref 43–77)
Platelets: 398 10*3/uL (ref 150–400)
RBC: 2.8 MIL/uL — ABNORMAL LOW (ref 4.22–5.81)
RDW: 16.2 % — AB (ref 11.5–15.5)
WBC: 13.3 10*3/uL — ABNORMAL HIGH (ref 4.0–10.5)

## 2015-02-03 LAB — GLUCOSE, CAPILLARY
Glucose-Capillary: 138 mg/dL — ABNORMAL HIGH (ref 65–99)
Glucose-Capillary: 113 mg/dL — ABNORMAL HIGH (ref 65–99)
Glucose-Capillary: 107 mg/dL — ABNORMAL HIGH (ref 65–99)
Glucose-Capillary: 130 mg/dL — ABNORMAL HIGH (ref 65–99)
Glucose-Capillary: 156 mg/dL — ABNORMAL HIGH (ref 65–99)

## 2015-02-03 LAB — HIV ANTIBODY (ROUTINE TESTING W REFLEX): HIV Screen 4th Generation wRfx: NONREACTIVE

## 2015-02-03 LAB — PHOSPHORUS: Phosphorus: 3.6 mg/dL (ref 2.5–4.6)

## 2015-02-03 LAB — HEPARIN LEVEL (UNFRACTIONATED): Heparin Unfractionated: 0.28 IU/mL — ABNORMAL LOW (ref 0.30–0.70)

## 2015-02-03 LAB — MAGNESIUM: MAGNESIUM: 1.6 mg/dL — AB (ref 1.7–2.4)

## 2015-02-03 MED ORDER — HYDROCODONE-ACETAMINOPHEN 5-325 MG PO TABS
1.0000 | ORAL_TABLET | ORAL | Status: DC | PRN
  Administered 2015-02-04: 2 via ORAL
  Administered 2015-02-04: 1 via ORAL
  Administered 2015-02-05 – 2015-02-06 (×4): 2 via ORAL
  Filled 2015-02-03 (×5): qty 2
  Filled 2015-02-03: qty 1
  Filled 2015-02-03: qty 2

## 2015-02-03 MED ORDER — LIDOCAINE HCL 1 % IJ SOLN
INTRAMUSCULAR | Status: AC
  Filled 2015-02-03: qty 20

## 2015-02-03 MED ORDER — IOHEXOL 300 MG/ML  SOLN
10.0000 mL | Freq: Once | INTRAMUSCULAR | Status: DC | PRN
Start: 2015-02-03 — End: 2015-02-23
  Administered 2015-02-03: 10 mL
  Filled 2015-02-03: qty 10

## 2015-02-03 MED ORDER — MIDAZOLAM HCL 2 MG/2ML IJ SOLN
INTRAMUSCULAR | Status: AC | PRN
  Administered 2015-02-03 (×2): 1 mg via INTRAVENOUS

## 2015-02-03 MED ORDER — FENTANYL CITRATE (PF) 100 MCG/2ML IJ SOLN
INTRAMUSCULAR | Status: AC
  Filled 2015-02-03: qty 4

## 2015-02-03 MED ORDER — MIDAZOLAM HCL 2 MG/2ML IJ SOLN
INTRAMUSCULAR | Status: AC
  Filled 2015-02-03: qty 6

## 2015-02-03 MED ORDER — FENTANYL CITRATE (PF) 100 MCG/2ML IJ SOLN
INTRAMUSCULAR | Status: AC | PRN
  Administered 2015-02-03: 50 ug via INTRAVENOUS

## 2015-02-03 NOTE — Progress Notes (Signed)
INFECTIOUS DISEASE PROGRESS NOTE  ID: Larry [name] is a 69 y.o. male with  Principal Problem:   Shock circulatory Active Problems:   Diabetes mellitus without complication   HTN (hypertension)   Paroxysmal atrial fibrillation   Obesity   Intra-abdominal abscess   Paroxysmal a-fib   Sepsis   Acute respiratory distress   Intra-abdominal hematoma   Acute on chronic kidney failure   Acute blood loss anemia   Catheter-associated urinary tract infection   Abdominal abscess   Intra-abdominal infection   Hematoma   Candidiasis of urogenital site   Acute respiratory failure with hypoxia   Infected hematoma following procedure   Leukocytosis  Subjective: C/o fatigue, drain pain.   Abtx:  Anti-infectives    Start     Dose/Rate Route Frequency Ordered Stop   02/02/15 2000  fluconazole (DIFLUCAN) tablet 100 mg     100 mg Oral Every 24 hours 02/02/15 1908 02/09/15 1959   02/02/15 1800  fluconazole (DIFLUCAN) tablet 100 mg  Status:  Discontinued     100 mg Oral Daily 02/02/15 1545 02/02/15 1901   01/31/15 1000  fluconazole (DIFLUCAN) tablet 100 mg  Status:  Discontinued     100 mg Oral Daily 01/31/15 0727 02/02/15 1313   01/29/15 1700  piperacillin-tazobactam (ZOSYN) IVPB 2.25 g     2.25 g 100 mL/hr over 30 Minutes Intravenous Every 8 hours 01/29/15 0845     01/24/15 2200  vancomycin (VANCOCIN) IVPB 1000 mg/200 mL premix     1,000 mg 200 mL/hr over 60 Minutes Intravenous  Once 01/24/15 1349 01/24/15 2301   01/21/15 2000  vancomycin (VANCOCIN) 1,500 mg in sodium chloride 0.9 % 500 mL IVPB  Status:  Discontinued     1,500 mg 250 mL/hr over 120 Minutes Intravenous Every 24 hours 01/21/15 1048 01/23/15 1726   01/20/15 2000  vancomycin (VANCOCIN) 1,250 mg in sodium chloride 0.9 % 250 mL IVPB  Status:  Discontinued     1,250 mg 166.7 mL/hr over 90 Minutes Intravenous Every 24 hours 01/19/15 1945 01/20/15 0843   01/20/15 2000  vancomycin (VANCOCIN) 1,250 mg in sodium chloride 0.9 %  250 mL IVPB  Status:  Discontinued     1,250 mg 166.7 mL/hr over 90 Minutes Intravenous Every 24 hours 01/20/15 1519 01/21/15 1048   01/20/15 0000  piperacillin-tazobactam (ZOSYN) IVPB 3.375 g  Status:  Discontinued     3.375 g 12.5 mL/hr over 240 Minutes Intravenous Every 8 hours 01/19/15 1946 01/29/15 0845   01/19/15 1715  vancomycin (VANCOCIN) 2,500 mg in sodium chloride 0.9 % 500 mL IVPB     2,500 mg 250 mL/hr over 120 Minutes Intravenous  Once 01/19/15 1707 01/19/15 2159   01/19/15 1715  piperacillin-tazobactam (ZOSYN) IVPB 3.375 g     3.375 g 100 mL/hr over 30 Minutes Intravenous  Once 01/19/15 1707 01/19/15 1900      Medications:  Scheduled: . antiseptic oral rinse  7 mL Mouth Rinse BID  . darbepoetin (ARANESP) injection - NON-DIALYSIS  200 mcg Subcutaneous Q Wed-1800  . diphenhydrAMINE  12.5 mg Intravenous Once  . feeding supplement  1 Container Oral TID BM  . feeding supplement (PRO-STAT SUGAR FREE 64)  30 mL Oral BID  . ferumoxytol  510 mg Intravenous Q72H  . fluconazole  100 mg Oral Q24H  . furosemide  80 mg Intravenous 3 times per day  . insulin aspart  0-15 Units Subcutaneous 6 times per day  . insulin glargine  35 Units Subcutaneous  QHS  . magnesium oxide  400 mg Oral BID  . nystatin   Topical TID  . ondansetron  8 mg Oral BID  . piperacillin-tazobactam (ZOSYN)  IV  2.25 g Intravenous Q8H  . potassium chloride  40 mEq Oral TID    Objective: Vital signs in last 24 hours: Temp:  [98 F (36.7 C)-98.8 F (37.1 C)] 98.8 F (37.1 C) (08/18 0800) Pulse Rate:  [94-97] 95 (08/18 1000) Resp:  [18-29] 26 (08/18 1000) BP: (117-144)/(47-107) 117/47 mmHg (08/18 1000) SpO2:  [92 %-99 %] 92 % (08/18 1000) Weight:  [122.3 kg (269 lb 10 oz)] 122.3 kg (269 lb 10 oz) (08/18 0140)   General appearance: alert, cooperative and no distress Resp: clear to auscultation bilaterally Cardio: regular rate and rhythm GI: normal findings: bowel sounds normal and soft, non-tender and  drain in RUQ.  Incision/Wound: drain in RUQ  Lab Results  Recent Labs  02/02/15 0352 02/03/15 0359  WBC 13.4* 13.3*  HGB 7.6* 8.1*  HCT 24.1* 24.9*  NA 136 137  K 3.4* 3.6  CL 101 101  CO2 27 27  BUN 50* 49*  CREATININE 3.94* 3.79*   Liver Panel No results for input(s): PROT, ALBUMIN, AST, ALT, ALKPHOS, BILITOT, BILIDIR, IBILI in the last 72 hours. Sedimentation Rate No results for input(s): ESRSEDRATE in the last 72 hours. C-Reactive Protein No results for input(s): CRP in the last 72 hours.  Microbiology: Recent Results (from the past 240 hour(s))  Urine culture     Status: None   Collection Time: 01/30/15 11:40 AM  Result Value Ref Range Status   Specimen Description URINE, RANDOM  Final   Special Requests NONE  Final   Culture   Final    >=100,000 COLONIES/mL YEAST Performed at [facility]    Report Status 02/01/2015 FINAL  Final    Studies/Results: Ct Abdomen Pelvis Wo Contrast  02/03/2015   CLINICAL DATA:  Leukocytosis, status post laparoscopic cholecystectomy on 07/18, complicated by infected hematoma in gallbladder fossa  EXAM: CT ABDOMEN AND PELVIS WITHOUT CONTRAST  TECHNIQUE: Multidetector CT imaging of the abdomen and pelvis was performed following the standard protocol without IV contrast.  COMPARISON:  01/27/2015  FINDINGS: Lower chest: Moderate right pleural effusion. Associated right lower lobe opacity, likely compressive atelectasis.  Trace left pleural effusion with associated dependent atelectasis.  Hepatobiliary: 2.0 x 3.1 cm subcapsular fluid collection along the posterior aspect of the left hepatic lobe (series 2/ image 26) previously 1.7 x 2.4 cm.  Subcapsular hematoma along the posterior liver. This measures 3.9 x 10.4 cm along the posterior right hepatic dome (series 2/image 19), previously 3.1 x 7.9 cm, and 8.7 x 12.6 cm inferior to the right hepatic lobe (series 2/ image 41), previously 7.4 x 12.0 cm.  9.8 x 9.2 cm hematoma with gas in the  gallbladder fossa (series 2/image 30), previously 9.8 x 10.0 cm, with indwelling pigtail drainage catheter.  Pancreas: Within normal limits.  Spleen: Within normal limits.  Adrenals/Urinary Tract: Adrenal glands unremarkable.  Kidneys are grossly unremarkable.  No renal, ureteral, or bladder calculi.  No hydronephrosis.  Bladder is decompressed by indwelling Foley catheter.  Stomach/Bowel: Stomach is notable for a small hiatal hernia.  No evidence of bowel obstruction.  Wall thickening involving the right colon (series 2/image 38), possibly secondary to ascites and right upper quadrant inflammation.  Vascular/Lymphatic: Atherosclerotic calcifications of the abdominal aorta and branch vessels.  No suspicious abdominopelvic lymphadenopathy.  Reproductive: Prostate is grossly unremarkable.  Other:  Moderate abdominopelvic ascites, stable versus mildly increased.  Musculoskeletal: Degenerative changes of the visualized thoracolumbar spine.  Right hip arthroplasty.  IMPRESSION: 9.8 cm hematoma with gas in the gallbladder fossa, with indwelling pigtail drainage catheter, mildly decreased.  12.6 cm hematoma along the posterior right liver, mildly increased.  Moderate abdominopelvic ascites, stable versus mildly increased.  Mild wall thickening involving the right colon, possibly secondary to ascites and right upper quadrant inflammation.   Electronically Signed   By: Charline Bills M.D.   On: 02/03/2015 11:22     Assessment/Plan: Gallbladder fossa hematoma, abscess Would continue zosyn Cx polymicrobial To get new (larger) drain WBC steady  ATN with ARF Watch his Cr, slightly better today Appreciate Dr Eliane Decree excellent care  Funguria Foley placed  Intertrigo Continue diflucan for 7 days Cholecystectomy April 2016  Protein Calorie Malnutrition (alb 2.1) Consider rechecking, continue diet per surgery Having diarrhea which he attributes to Ensure (after each can) Check C diff only if change in  status: fever, WBC   Total days of antibiotics: 15 Zosyn 8/3 ---> Fluconazole 8/17 ---> 8/24         Johny Sax Infectious Diseases (pager) [phone] [email or web address] 02/03/2015, 1:07 PM  LOS: 15 days

## 2015-02-03 NOTE — Care Management Important Message (Signed)
Important Message  Patient Details  Name: Larry [name] MRN: [id number] Date of Birth: 1945-08-24   Medicare Important Message Given:  Yes-third notification given    Haskell Flirt 02/03/2015, 12:50 PMImportant Message  Patient Details  Name: Larry [name] MRN: [id number] Date of Birth: 01-May-1946   Medicare Important Message Given:  Yes-third notification given    Haskell Flirt 02/03/2015, 12:50 PM

## 2015-02-03 NOTE — Progress Notes (Addendum)
Spoke with IR. Will try to upsize drain today. Can resume diet after IR procedure. Will hold hep gtt just in case.   [name]. [name], MD, FACS General, Bariatric, & Minimally Invasive Surgery [facility], [place]
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
## 2015-02-03 NOTE — Progress Notes (Signed)
Patient ID: Larry [name], male   DOB: 04-11-1946, 69 y.o.   MRN: [id number] [facility] PROGRESS NOTE  Larry [name] WUJ:[id number] DOB: 1946/05/04 DOA: 01/19/2015 PCP: [name]  Brief narrative:    69 year old male with past medical history of atrial fibrillation (on anticoagulation with Eliquis), hypertension, dyslipidemia, diabetes. Patient underwent laparoscopic cholecystectomy on 01/03/2015. He subsequently developed infected gallbladder fossa hematoma and has underwent drain placement (cultures from the fluid analysis yielded mixed organisms). Patient presented to [facility] 01/19/2015 with septic shock and was then transferred to [facility] for management of infected hematoma. He has required pressor support on admission but has been off pressors since 01/21/2015.   During this hospital stay patient required 2 units of PRBC transfusion for increasing intraperitoneal hemorrhage on 01/23/2015.  Hospital course further complicated with yeast UTI as well as acute renal failure. Both, nephrology and ID are seeing the patietn in consultationl. TR[facility] consulted for DM management.   Barrier to discharge: Plan for repeat CT scan today and if no change in hematoma to have IR to upsize the drain.   Assessment/Plan:    Principal Problem: Septic shock secondary to complicated cholecystitis status post cholecystectomy 01/03/2015 with infected hematoma / Leukocytosis - Septic shock on admission likely secondary to infected gallbladder fossa hematoma status post recent cholecystectomy and drain placement  - Pt required pressor support on admission thorough 01/21/2015 - Cultures from fluid analysis yielded mixed organisms - ID managing abx. Currently, pt is on zosyn.  - Plan for repeat CT scan today and if no change in hematoma to have IR to upsize the drain - Continue pain management efforts with fentanyl 50 mcg IV every 2 hours for severe pain and oxycodone 2.5 mg PO every 4 hours PRN moderate pain   Active  Problems: Yeast UTI - Continue fluconazole 100 mg daily per ID recommendations   Acute hypoxic respiratory failure secondary to volume overload and anasarca - Stable respiratory status but clinically volume overloaded which is likely from volume resuscitation during initial phases of septic shock - No pulmonary edema seen on CXR done 01/30/15 - Continue lasix 80 mg IV Q 8 hours - Weight in past 72 hours: 122.2 kg --> 127.4 kg --> 122.3 kg - Over past 24 hours patient is  Negative 1.5 L fluid balance - Renal on board as well to manage high dose lasix titration - Last 2 D ECHO in 09/2014 with normal EF - Continue daily weight and strict intake and output  Acute renal failure - Likely from ATN associated with features of sepsis during the initial phase of hospitalization.  - Patient continues to have excellent urinary output in response to furosemide.  - Appreciate renal following and their recommendations   Hypokalemia/hypomagnesemia - Secondary to lasix - Supplemented   Anemia of chronic disease / Acute blood loss anemia - Due to anticoagulation and hematoma - Since pt on heparin IV will continue to monitor for bleed - Hemoglobin has been stable at 7.6 over past few days and this am 8.1  - Of note, has received 2 units PRBC since admission 01/23/2015   History of atrial fibrillation - CHADS vasc score at least 3 - On eliquis at home but now on IV heparin - Rate controlled   Diabetes mellitus type 2, uncontrolled - A1c 8.2 indicating poor glycemic control - Pt currently on Lantus 35 units at bedtime and SSI - CBG's in past 24 hours: 168, 170, 142 - DM coordinator consulted   Morbid  obesity - Body mass index is 45.35 kg/(m^2). - Nutrition consulted    DVT Prophylaxis  - IV heparin    Code Status: Full.  Family Communication:  plan of care discussed with the patient and his sister at the bedside Disposition Plan: Per primary team   IV access:  Peripheral  IV  Procedures and diagnostic studies:    Dg Chest Port 1 View 2015-02-13  Right IJ central line with tip in right atrium again noted. No pulmonary edema. There is chronic elevation of the right hemidiaphragm with right basilar atelectasis.    Medical Consultants:  Surgery Nephrology Infectious disease  Other Consultants:  Diabetic coordinator Nutrition  PT/OT  IAnti-Infectives:   Diflucan Yetta Barre, MD  [facility] Pager [phone]  Time spent in minutes: 25 minutes  If 7PM-7AM, please contact night-coverage [email or web address] Password [email or web address] 02/03/2015, 6:42 AM   LOS: 15 days    HPI/Subjective: No acute overnight events. Patient reports pain slightly better.  Objective: Filed Vitals:   02/03/15 0040 02/03/15 0140 02/03/15 0424 02/03/15 0455  BP: 128/107   122/60  Pulse: 97   94  Temp:   98 F (36.7 C)   TempSrc:   Oral   Resp: 18   21  Height:      Weight:  122.3 kg (269 lb 10 oz)    SpO2: 93%   94%    Intake/Output Summary (Last 24 hours) at 02/03/15 4540 Last data filed at 02/03/15 0600  Gross per 24 hour  Intake   1255 ml  Output   2760 ml  Net  -1505 ml    Exam:   General:  Pt is alert, not in acute distress  Cardiovascular: Regular rate and rhythm, S1/S2 appreciated   Respiratory: No wheezing, no crackles, no rhonchi  Abdomen: drain in right abdomen (+), abdomen firm, non tender, (+) BS  Extremities: +1-2 LE pitting edema, pulses palpable bilaterally  Neuro: Nonfocal  Data Reviewed: Basic Metabolic Panel:  Recent Labs Lab February 13, 2015 0440 01/31/15 0500 02/01/15 0418 02/02/15 0352 02/03/15 0359  NA 130* 137 136 136 137  K 3.2* 3.2* 3.2* 3.4* 3.6  CL 101 103 101 101 101  CO2 24 26 27 27 27   GLUCOSE 389* 170* 200* 187* 136*  BUN 46* 48* 48* 50* 49*  CREATININE 3.60* 3.80* 3.92* 3.94* 3.79*  CALCIUM 8.0* 8.4* 8.4* 8.5* 8.8*  MG 1.7 1.6* 1.5* 1.6* 1.6*  PHOS 3.2 3.8 3.9 3.7 3.6   Liver Function Tests:  Recent  Labs Lab 01/28/15 0320 01/29/15 0517 02-13-15 0440  AST 16 19 16   ALT 19 16* 14*  ALKPHOS 70 73 65  BILITOT 1.7* 1.5* 1.3*  PROT 6.0* 5.9* 5.6*  ALBUMIN 2.2* 2.1* 2.1*   No results for input(s): LIPASE, AMYLASE in the last 168 hours. No results for input(s): AMMONIA in the last 168 hours. CBC:  Recent Labs Lab Feb 13, 2015 0440 February 13, 2015 1800 01/31/15 0500 02/01/15 0418 02/02/15 0352 02/03/15 0359  WBC 11.9*  --  13.4* 14.6* 13.4* 13.3*  NEUTROABS 9.8*  --  11.5* 12.2* 11.3* 11.1*  HGB 7.3* 7.5* 7.6* 7.6* 7.6* 8.1*  HCT 22.3* 23.7* 23.8* 23.8* 24.1* 24.9*  MCV 91.0  --  89.5 89.5 89.3 88.9  PLT 336  --  338 368 378 398   Cardiac Enzymes: No results for input(s): CKTOTAL, CKMB, CKMBINDEX, TROPONINI in the last 168 hours. BNP: Invalid input(s): POCBNP CBG:  Recent Labs Lab 02/02/15 0753 02/02/15 1241 02/02/15 1601  02/02/15 1952 02/02/15 2336  GLUCAP 161* 189* 168* 170* 142*    Urine culture     Status: None   Collection Time: 01/30/15 11:40 AM  Result Value Ref Range Status   Specimen Description URINE, RANDOM  Final   Special Requests NONE  Final   Culture   Final    >=100,000 COLONIES/mL YEAST Performed at [facility]    Report Status 02/01/2015 FINAL  Final     . darbepoetin (ARANESP) injection - NON-DIALYSIS  200 mcg Subcutaneous Q Wed-1800  . feeding supplement  1 Container Oral TID BM  . feeding supplement (PRO-STAT SUGAR FREE 64)  30 mL Oral BID  . ferumoxytol  510 mg Intravenous Q72H  . fluconazole  100 mg Oral Q24H  . furosemide  80 mg Intravenous 3 times per day  . insulin aspart  0-15 Units Subcutaneous 6 times per day  . insulin glargine  35 Units Subcutaneous QHS  . magnesium oxide  400 mg Oral BID  . nystatin   Topical TID  . ondansetron  8 mg Oral BID  . piperacillin-tazobactam (ZOSYN)  IV  2.25 g Intravenous Q8H  . potassium chloride  40 mEq Oral TID     Continuous Infusions: . sodium chloride 10 mL/hr at 01/31/15 0508  .  heparin 2,100 Units/hr (02/03/15 0029)

## 2015-02-03 NOTE — Progress Notes (Signed)
Patient ID: Larry [name], male   DOB: 1946-04-01, 69 y.o.   MRN: [id number]  [facility] Progress Note   Assessment/ Plan:   1. Acute renal failure: appears to be ATN associated with features of sepsis during his initial phase of hospitalization. With good urine output overnight and seemingly some improvement of creatinine-maintained diuretic at current dose and continue to monitor for renal recovery. No acute dialysis needs at this time. 2. Hypokalemia/hypomagnesemia: Secondary to brisk diuresis/urinary losses, continue supplementation with magnesium and potassium. 3. Gallbladder fossa abscess status post drain placement by interventional radiology-cultures negative and on empiric Zosyn-seen by infectious disease yesterday and repeat cultures checked. Appears that he will get larger sized drain for abdominal hematoma. 4. Anasarca/volume overload: Attempting diuretic therapy with net negative fluid balance overnight now 5. Anemia: Possibly associated overt losses and recent critical illness, s/p intravenous Feraheme and started Aranesp  Subjective:   Complaints of some abdominal pain-getting IV started left hand    Objective:   BP 117/47 mmHg  Pulse 95  Temp(Src) 98.8 F (37.1 C) (Oral)  Resp 26  Ht 5\' 6"  (1.676 m)  Wt 122.3 kg (269 lb 10 oz)  BMI 43.54 kg/m2  SpO2 92%  Intake/Output Summary (Last 24 hours) at 02/03/15 1212 Last data filed at 02/03/15 0700  Gross per 24 hour  Intake    704 ml  Output   2765 ml  Net  -2061 ml   Weight change: 0.1 kg (3.5 oz)  Physical Exam: Gen: Appears to be uncomfortable resting in bed, wife at bedside CVS: Pulse regular in rate and rhythm, S1 and S2 normal Resp: Decreased breath sounds over bases otherwise clear-no rales Abd: Soft, obese, right upper quadrant percutaneous drain in situ Ext: 2+ lower extremity edema as well as scrotal/penile edema that appears to be improving  Imaging: Ct Abdomen Pelvis Wo  Contrast  02/03/2015   CLINICAL DATA:  Leukocytosis, status post laparoscopic cholecystectomy on 07/18, complicated by infected hematoma in gallbladder fossa  EXAM: CT ABDOMEN AND PELVIS WITHOUT CONTRAST  TECHNIQUE: Multidetector CT imaging of the abdomen and pelvis was performed following the standard protocol without IV contrast.  COMPARISON:  01/27/2015  FINDINGS: Lower chest: Moderate right pleural effusion. Associated right lower lobe opacity, likely compressive atelectasis.  Trace left pleural effusion with associated dependent atelectasis.  Hepatobiliary: 2.0 x 3.1 cm subcapsular fluid collection along the posterior aspect of the left hepatic lobe (series 2/ image 26) previously 1.7 x 2.4 cm.  Subcapsular hematoma along the posterior liver. This measures 3.9 x 10.4 cm along the posterior right hepatic dome (series 2/image 19), previously 3.1 x 7.9 cm, and 8.7 x 12.6 cm inferior to the right hepatic lobe (series 2/ image 41), previously 7.4 x 12.0 cm.  9.8 x 9.2 cm hematoma with gas in the gallbladder fossa (series 2/image 30), previously 9.8 x 10.0 cm, with indwelling pigtail drainage catheter.  Pancreas: Within normal limits.  Spleen: Within normal limits.  Adrenals/Urinary Tract: Adrenal glands unremarkable.  Kidneys are grossly unremarkable.  No renal, ureteral, or bladder calculi.  No hydronephrosis.  Bladder is decompressed by indwelling Foley catheter.  Stomach/Bowel: Stomach is notable for a small hiatal hernia.  No evidence of bowel obstruction.  Wall thickening involving the right colon (series 2/image 38), possibly secondary to ascites and right upper quadrant inflammation.  Vascular/Lymphatic: Atherosclerotic calcifications of the abdominal aorta and branch vessels.  No suspicious abdominopelvic lymphadenopathy.  Reproductive: Prostate is grossly unremarkable.  Other: Moderate abdominopelvic ascites, stable  versus mildly increased.  Musculoskeletal: Degenerative changes of the visualized  thoracolumbar spine.  Right hip arthroplasty.  IMPRESSION: 9.8 cm hematoma with gas in the gallbladder fossa, with indwelling pigtail drainage catheter, mildly decreased.  12.6 cm hematoma along the posterior right liver, mildly increased.  Moderate abdominopelvic ascites, stable versus mildly increased.  Mild wall thickening involving the right colon, possibly secondary to ascites and right upper quadrant inflammation.   Electronically Signed   By: Charline Bills M.D.   On: 02/03/2015 11:22    Labs: BMET  Recent Labs Lab 01/28/15 0320 01/29/15 0517 01/30/15 0440 01/31/15 0500 02/01/15 0418 02/02/15 0352 02/03/15 0359  NA 139 140 130* 137 136 136 137  K 3.4* 3.5 3.2* 3.2* 3.2* 3.4* 3.6  CL 107 108 101 103 101 101 101  CO2 GLUCOSE 131* 173* 389* 170* 200* 187* 136*  BUN 50* 50* 46* 48* 48* 50* 49*  CREATININE 3.58* 3.69* 3.60* 3.80* 3.92* 3.94* 3.79*  CALCIUM 8.3* 8.5* 8.0* 8.4* 8.4* 8.5* 8.8*  PHOS 3.4 3.2 3.2 3.8 3.9 3.7 3.6   CBC  Recent Labs Lab 01/31/15 0500 02/01/15 0418 02/02/15 0352 02/03/15 0359  WBC 13.4* 14.6* 13.4* 13.3*  NEUTROABS 11.5* 12.2* 11.3* 11.1*  HGB 7.6* 7.6* 7.6* 8.1*  HCT 23.8* 23.8* 24.1* 24.9*  MCV 89.5 89.5 89.3 88.9  PLT 338 368 378 398    Medications:    . antiseptic oral rinse  7 mL Mouth Rinse BID  . darbepoetin (ARANESP) injection - NON-DIALYSIS  200 mcg Subcutaneous Q Wed-1800  . diphenhydrAMINE  12.5 mg Intravenous Once  . feeding supplement  1 Container Oral TID BM  . feeding supplement (PRO-STAT SUGAR FREE 64)  30 mL Oral BID  . ferumoxytol  510 mg Intravenous Q72H  . fluconazole  100 mg Oral Q24H  . furosemide  80 mg Intravenous 3 times per day  . insulin aspart  0-15 Units Subcutaneous 6 times per day  . insulin glargine  35 Units Subcutaneous QHS  . magnesium oxide  400 mg Oral BID  . nystatin   Topical TID  . ondansetron  8 mg Oral BID  . piperacillin-tazobactam (ZOSYN)  IV  2.25 g Intravenous Q8H   . potassium chloride  40 mEq Oral TID   Zetta Bills, MD 02/03/2015, 12:12 PM

## 2015-02-03 NOTE — Progress Notes (Signed)
Referring Physician(s): CCS  Chief Complaint:  Follow up for gallbladder fossa abscess, s/p drain placed on 8/4  Subjective:  Pt is doing ok.  He denies any N/V. Minimal abdominal pain.  CT scan shows mild increase in size of hematoma along the posterior right liver  Allergies: Review of patient's allergies indicates no known allergies.  Medications: Prior to Admission medications   Medication Sig Start Date End Date Taking? Authorizing Provider  allopurinol (ZYLOPRIM) 300 MG tablet Take 300 mg by mouth daily.   Yes Historical Provider, MD  amLODipine (NORVASC) 10 MG tablet Take 10 mg by mouth every morning.    Yes Historical Provider, MD  apixaban (ELIQUIS) 5 MG TABS tablet Take 1 tablet (5 mg total) by mouth 2 (two) times daily. 10/01/14  Yes April Palumbo, MD  aspirin 81 MG tablet Take 81 mg by mouth daily.   Yes Historical Provider, MD  atorvastatin (LIPITOR) 80 MG tablet Take 80 mg by mouth at bedtime.    Yes Historical Provider, MD  Cholecalciferol (VITAMIN D PO) Take 2,000 Units by mouth daily.    Yes Historical Provider, MD  citalopram (CELEXA) 40 MG tablet Take 40 mg by mouth every morning.    Yes Historical Provider, MD  furosemide (LASIX) 40 MG tablet Take 40 mg by mouth every morning.    Yes Historical Provider, MD  glucose 4 GM chewable tablet Chew 1 tablet by mouth as needed for low blood sugar (only if BS IS BELOW 70).   Yes Historical Provider, MD  insulin glargine (LANTUS) 100 UNIT/ML injection Inject 40 Units into the skin at bedtime.   Yes Historical Provider, MD  magnesium oxide (MAG-OX) 400 MG tablet Take 400 mg by mouth 2 (two) times daily.    Yes Historical Provider, MD  metFORMIN (GLUCOPHAGE) 500 MG tablet Take 500 mg by mouth 2 (two) times daily with a meal.   Yes Historical Provider, MD  oxyCODONE-acetaminophen (PERCOCET/ROXICET) 5-325 MG per tablet Take 1-2 tablets by mouth every 4 (four) hours as needed for moderate pain. 01/05/15  Yes [name], MD    pantoprazole (PROTONIX) 40 MG tablet Take 40 mg by mouth daily.   Yes Historical Provider, MD  vitamin B-12 (CYANOCOBALAMIN) 500 MCG tablet Take 500 mcg by mouth daily.   Yes Historical Provider, MD  Liraglutide 18 MG/3ML SOPN Inject 1.2 mg into the skin daily. Pt states has been on hold since discharged 10-06-14 hospital visit    Historical Provider, MD  lisinopril (PRINIVIL,ZESTRIL) 40 MG tablet Take 40 mg by mouth 2 (two) times daily. On hold since 10-06-14    Historical Provider, MD  ondansetron (ZOFRAN) 8 MG tablet Take 8 mg by mouth 2 (two) times daily. Not taking-on hold form 10-06-14    Historical Provider, MD     Vital Signs: BP 117/47 mmHg  Pulse 95  Temp(Src) 98.8 F (37.1 C) (Oral)  Resp 26  Ht  (1.676 m)  Wt 269 lb 10 oz (122.3 kg)  BMI 43.54 kg/m2  SpO2 92%  Physical Exam   Awake and Alert Lungs Clear Heart RRR Abdomen Soft, mildly tender RUQ Drain in place. Only 5 mls recorded output  Imaging: Ct Abdomen Pelvis Wo Contrast  02/03/2015   CLINICAL DATA:  Leukocytosis, status post laparoscopic cholecystectomy on 07/18, complicated by infected hematoma in gallbladder fossa  EXAM: CT ABDOMEN AND PELVIS WITHOUT CONTRAST  TECHNIQUE: Multidetector CT imaging of the abdomen and pelvis was performed following the standard protocol without IV  contrast.  COMPARISON:  01/27/2015  FINDINGS: Lower chest: Moderate right pleural effusion. Associated right lower lobe opacity, likely compressive atelectasis.  Trace left pleural effusion with associated dependent atelectasis.  Hepatobiliary: 2.0 x 3.1 cm subcapsular fluid collection along the posterior aspect of the left hepatic lobe (series 2/ image 26) previously 1.7 x 2.4 cm.  Subcapsular hematoma along the posterior liver. This measures 3.9 x 10.4 cm along the posterior right hepatic dome (series 2/image 19), previously 3.1 x 7.9 cm, and 8.7 x 12.6 cm inferior to the right hepatic lobe (series 2/ image 41), previously 7.4 x 12.0 cm.   9.8 x 9.2 cm hematoma with gas in the gallbladder fossa (series 2/image 30), previously 9.8 x 10.0 cm, with indwelling pigtail drainage catheter.  Pancreas: Within normal limits.  Spleen: Within normal limits.  Adrenals/Urinary Tract: Adrenal glands unremarkable.  Kidneys are grossly unremarkable.  No renal, ureteral, or bladder calculi.  No hydronephrosis.  Bladder is decompressed by indwelling Foley catheter.  Stomach/Bowel: Stomach is notable for a small hiatal hernia.  No evidence of bowel obstruction.  Wall thickening involving the right colon (series 2/image 38), possibly secondary to ascites and right upper quadrant inflammation.  Vascular/Lymphatic: Atherosclerotic calcifications of the abdominal aorta and branch vessels.  No suspicious abdominopelvic lymphadenopathy.  Reproductive: Prostate is grossly unremarkable.  Other: Moderate abdominopelvic ascites, stable versus mildly increased.  Musculoskeletal: Degenerative changes of the visualized thoracolumbar spine.  Right hip arthroplasty.  IMPRESSION: 9.8 cm hematoma with gas in the gallbladder fossa, with indwelling pigtail drainage catheter, mildly decreased.  12.6 cm hematoma along the posterior right liver, mildly increased.  Moderate abdominopelvic ascites, stable versus mildly increased.  Mild wall thickening involving the right colon, possibly secondary to ascites and right upper quadrant inflammation.   Electronically Signed   By: Charline Bills M.D.   On: 02/03/2015 11:22    Labs:  CBC:  Recent Labs  01/31/15 0500 02/01/15 0418 02/02/15 0352 02/03/15 0359  WBC 13.4* 14.6* 13.4* 13.3*  HGB 7.6* 7.6* 7.6* 8.1*  HCT 23.8* 23.8* 24.1* 24.9*  PLT 338 368 378 398    COAGS:  Recent Labs  10/06/14 0445 01/19/15 1613 01/23/15 0754  INR 1.07 1.46 1.32  APTT 34 35 38*    BMP:  Recent Labs  01/31/15 0500 02/01/15 0418 02/02/15 0352 02/03/15 0359  NA 137 136 136 137  K 3.2* 3.2* 3.4* 3.6  CL 103 101 101 101  CO2 26 27  27 27   GLUCOSE 170* 200* 187* 136*  BUN 48* 48* 50* 49*  CALCIUM 8.4* 8.4* 8.5* 8.8*  CREATININE 3.80* 3.92* 3.94* 3.79*  GFRNONAA 15* 14* 14* 15*  GFRAA 17* 17* 17* 17*    LIVER FUNCTION TESTS:  Recent Labs  01/27/15 0345 01/28/15 0320 01/29/15 0517 01/30/15 0440  BILITOT 1.7* 1.7* 1.5* 1.3*  AST 17 16 19 16   ALT 22 19 16* 14*  ALKPHOS 77 70 73 65  PROT 6.1* 6.0* 5.9* 5.6*  ALBUMIN 2.3* 2.2* 2.1* 2.1*    Assessment and Plan:  Will upsize the GB Fossa drain today under image guidance by Dr. Deanne Coffer  Risks and Benefits discussed with the patient including bleeding, infection, damage to adjacent structures, bowel perforation/fistula connection, and sepsis.  All of the patient's questions were answered, patient is agreeable to proceed. Consent signed and in chart.   Signed: Gwynneth Macleod PA-C 02/03/2015, 1:57 PM   I spent a total of 15 Minutes at the the patient's bedside AND on the  patient's hospital floor or unit, greater than 50% of which was counseling/coordinating care for follow up drain placement.

## 2015-02-03 NOTE — Progress Notes (Signed)
Date:  February 03, 2015 U.R. performed for needs and level of care. Will continue to follow for Case Management needs.  Rhonda Davis, RN, BSN, CCM   [phone] 

## 2015-02-03 NOTE — Care Management Note (Signed)
Case Management Note  Patient Details  Name: Larry [name] MRN: [id number] Date of Birth: 1945-07-03  Subjective/Objective:                    Action/Plan:   Expected Discharge Date:   (unknown)               Expected Discharge Plan:  Home/Self Care  In-House Referral:     Discharge planning Services  CM Consult  Post Acute Care Choice:    Choice offered to:     DME Arranged:    DME Agency:     HH Arranged:    HH Agency:     Status of Service:  In process, will continue to follow  Medicare Important Message Given:  Yes-third notification given Date Medicare IM Given:    Medicare IM give by:    Date Additional Medicare IM Given:    Additional Medicare Important Message give by:     If discussed at Long Length of Stay Meetings, dates discussed:  [id number]  Additional Comments:  Golda Acre, RN 02/03/2015, 1:08 PM

## 2015-02-03 NOTE — Progress Notes (Signed)
Calorie Count Note  48 hour calorie count ordered.  Diet: Soft (currently NPO since midnight for CT scan) Supplements: Boost Breeze TID, Prostat BID  Nutrition Dx: Inadequate oral intake related to inability to eat as evidenced by NPO status.   Goal: Patient will meet greater than or equal to 90% of their needs  Intervention:  - Continue Soft diet - Continue Boost Breeze TID and Prostat BID - If poor intakes persist, recommend Panda withVital AF 1.2 @ 60 mL/hr to provide 1728 kcal (97% minimal needs), 108 grams protein, and 1168 mL free water  (from note 8/15)   Pt out of room to CT at this time. MD note from this AM indicates that pt stated drinking 3 Boost Breeze yesterday but RN unable to confirm. Will follow-up tomorrow with full note as well as results of calorie count from first 24 hours.    Trenton Gammon, RD, LDN Inpatient Clinical Dietitian Pager # [phone] After hours/weekend pager # [phone]

## 2015-02-03 NOTE — Progress Notes (Addendum)
Subjective: Says he drank 3 resources but not documented and nurse can't confirm. No n/v. Min abd pain. +bm  Objective: Vital signs in last 24 hours: Temp:  [98 F (36.7 C)-98.7 F (37.1 C)] 98 F (36.7 C) (08/18 0424) Pulse Rate:  [94-98] 94 (08/18 0455) Resp:  [18-26] 21 (08/18 0455) BP: (111-144)/(51-107) 122/60 mmHg (08/18 0455) SpO2:  [93 %-99 %] 94 % (08/18 0455) Weight:  [122.3 kg (269 lb 10 oz)] 122.3 kg (269 lb 10 oz) (08/18 0140) Last BM Date: 02/02/15  Intake/Output from previous day: 08/17 0701 - 08/18 0700 In: 1224 [P.O.:325; I.V.:744; IV Piggyback:150] Out: 2765 [Urine:2760; Drains:5] Intake/Output this shift:    Alert, nad cta b/l Reg Obese, soft, nt, nd Mild pitting edema  Lab Results:   Recent Labs  02/02/15 0352 02/03/15 0359  WBC 13.4* 13.3*  HGB 7.6* 8.1*  HCT 24.1* 24.9*  PLT 378 398   BMET  Recent Labs  02/02/15 0352 02/03/15 0359  NA 136 137  K 3.4* 3.6  CL 101 101  CO2 27 27  GLUCOSE 187* 136*  BUN 50* 49*  CREATININE 3.94* 3.79*  CALCIUM 8.5* 8.8*   PT/INR No results for input(s): LABPROT, INR in the last 72 hours. ABG No results for input(s): PHART, HCO3 in the last 72 hours.  Invalid input(s): PCO2, PO2  Studies/Results: No results found.  Anti-infectives: Anti-infectives    Start     Dose/Rate Route Frequency Ordered Stop   02/02/15 2000  fluconazole (DIFLUCAN) tablet 100 mg     100 mg Oral Every 24 hours 02/02/15 1908 02/09/15 1959   02/02/15 1800  fluconazole (DIFLUCAN) tablet 100 mg  Status:  Discontinued     100 mg Oral Daily 02/02/15 1545 02/02/15 1901   01/31/15 1000  fluconazole (DIFLUCAN) tablet 100 mg  Status:  Discontinued     100 mg Oral Daily 01/31/15 0727 02/02/15 1313   01/29/15 1700  piperacillin-tazobactam (ZOSYN) IVPB 2.25 g     2.25 g 100 mL/hr over 30 Minutes Intravenous Every 8 hours 01/29/15 0845     01/24/15 2200  vancomycin (VANCOCIN) IVPB 1000 mg/200 mL premix     1,000 mg 200 mL/hr  over 60 Minutes Intravenous  Once 01/24/15 1349 01/24/15 2301   01/21/15 2000  vancomycin (VANCOCIN) 1,500 mg in sodium chloride 0.9 % 500 mL IVPB  Status:  Discontinued     1,500 mg 250 mL/hr over 120 Minutes Intravenous Every 24 hours 01/21/15 1048 01/23/15 1726   01/20/15 2000  vancomycin (VANCOCIN) 1,250 mg in sodium chloride 0.9 % 250 mL IVPB  Status:  Discontinued     1,250 mg 166.7 mL/hr over 90 Minutes Intravenous Every 24 hours 01/19/15 1945 01/20/15 0843   01/20/15 2000  vancomycin (VANCOCIN) 1,250 mg in sodium chloride 0.9 % 250 mL IVPB  Status:  Discontinued     1,250 mg 166.7 mL/hr over 90 Minutes Intravenous Every 24 hours 01/20/15 1519 01/21/15 1048   01/20/15 0000  piperacillin-tazobactam (ZOSYN) IVPB 3.375 g  Status:  Discontinued     3.375 g 12.5 mL/hr over 240 Minutes Intravenous Every 8 hours 01/19/15 1946 01/29/15 0845   01/19/15 1715  vancomycin (VANCOCIN) 2,500 mg in sodium chloride 0.9 % 500 mL IVPB     2,500 mg 250 mL/hr over 120 Minutes Intravenous  Once 01/19/15 1707 01/19/15 2159   01/19/15 1715  piperacillin-tazobactam (ZOSYN) IVPB 3.375 g     3.375 g 100 mL/hr over 30 Minutes Intravenous  Once 01/19/15  1707 01/19/15 1900      Assessment/Plan: S/p interval lap cholecystectomy 7/18 GB fossa hematoma ARF on CKD Anemia Moderate PCMN UTI  Cont foley for strict i/o Lasix per renal Transduce CVP to check volume status Cr finally starting to come down Work on establishing pIV so we can remove central line Will start calorie counts this pm Work on establishing P-IV, since ID doesn't think he'll need long term IV abx CT today to evaluate hematoma. If still same size/larger, will ask IR to place larger drain +/- TPA NPO except meds/water til we have scan results back and know whether or not going to upsize drain Really appreciate assist from consultants Updated family at bedside  [name]. [name], MD, FACS General, Bariatric, & Minimally Invasive  Surgery [facility], [place]   LOS: 15 days    [name] 02/03/2015

## 2015-02-03 NOTE — Procedures (Signed)
Exchange 92f for 85f RUQ abscess drain under fluoro No complication No blood loss. See complete dictation in [facility].
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
## 2015-02-04 DIAGNOSIS — E669 Obesity, unspecified: Secondary | ICD-10-CM

## 2015-02-04 LAB — CBC WITH DIFFERENTIAL/PLATELET
BASOS ABS: 0 10*3/uL (ref 0.0–0.1)
BASOS PCT: 0 % (ref 0–1)
EOS ABS: 0.1 10*3/uL (ref 0.0–0.7)
EOS PCT: 1 % (ref 0–5)
HCT: 23.9 % — ABNORMAL LOW (ref 39.0–52.0)
Hemoglobin: 7.8 g/dL — ABNORMAL LOW (ref 13.0–17.0)
Lymphocytes Relative: 5 % — ABNORMAL LOW (ref 12–46)
Lymphs Abs: 0.7 10*3/uL (ref 0.7–4.0)
MCH: 29.1 pg (ref 26.0–34.0)
MCHC: 32.6 g/dL (ref 30.0–36.0)
MCV: 89.2 fL (ref 78.0–100.0)
MONO ABS: 1 10*3/uL (ref 0.1–1.0)
Monocytes Relative: 7 % (ref 3–12)
Neutro Abs: 12.7 10*3/uL — ABNORMAL HIGH (ref 1.7–7.7)
Neutrophils Relative %: 87 % — ABNORMAL HIGH (ref 43–77)
PLATELETS: 360 10*3/uL (ref 150–400)
RBC: 2.68 MIL/uL — ABNORMAL LOW (ref 4.22–5.81)
RDW: 16.3 % — AB (ref 11.5–15.5)
WBC: 14.5 10*3/uL — ABNORMAL HIGH (ref 4.0–10.5)

## 2015-02-04 LAB — HEPARIN LEVEL (UNFRACTIONATED)
HEPARIN UNFRACTIONATED: 0.23 [IU]/mL — AB (ref 0.30–0.70)
HEPARIN UNFRACTIONATED: 0.36 [IU]/mL (ref 0.30–0.70)
HEPARIN UNFRACTIONATED: 0.56 [IU]/mL (ref 0.30–0.70)

## 2015-02-04 LAB — GLUCOSE, CAPILLARY
GLUCOSE-CAPILLARY: 158 mg/dL — AB (ref 65–99)
GLUCOSE-CAPILLARY: 214 mg/dL — AB (ref 65–99)
Glucose-Capillary: 138 mg/dL — ABNORMAL HIGH (ref 65–99)
Glucose-Capillary: 144 mg/dL — ABNORMAL HIGH (ref 65–99)
Glucose-Capillary: 98 mg/dL (ref 65–99)
Glucose-Capillary: 155 mg/dL — ABNORMAL HIGH (ref 65–99)

## 2015-02-04 LAB — BASIC METABOLIC PANEL
ANION GAP: 9 (ref 5–15)
BUN: 50 mg/dL — ABNORMAL HIGH (ref 6–20)
CALCIUM: 8.6 mg/dL — AB (ref 8.9–10.3)
CO2: 28 mmol/L (ref 22–32)
Chloride: 99 mmol/L — ABNORMAL LOW (ref 101–111)
Creatinine, Ser: 3.63 mg/dL — ABNORMAL HIGH (ref 0.61–1.24)
GFR calc Af Amer: 18 mL/min — ABNORMAL LOW (ref >60)
GFR, EST NON AFRICAN AMERICAN: 16 mL/min — AB (ref >60)
GLUCOSE: 147 mg/dL — AB (ref 65–99)
Potassium: 3.5 mmol/L (ref 3.5–5.1)
SODIUM: 136 mmol/L (ref 135–145)

## 2015-02-04 LAB — PHOSPHORUS: PHOSPHORUS: 3.8 mg/dL (ref 2.5–4.6)

## 2015-02-04 LAB — MAGNESIUM: MAGNESIUM: 1.5 mg/dL — AB (ref 1.7–2.4)

## 2015-02-04 LAB — HEPATITIS C ANTIBODY (REFLEX)

## 2015-02-04 LAB — HCV COMMENT:

## 2015-02-04 MED ORDER — HEPARIN (PORCINE) IN NACL 100-0.45 UNIT/ML-% IJ SOLN
2400.0000 [IU]/h | INTRAMUSCULAR | Status: DC
  Administered 2015-02-04 – 2015-02-10 (×13): 2400 [IU]/h via INTRAVENOUS
  Filled 2015-02-04 (×19): qty 250

## 2015-02-04 MED ORDER — FUROSEMIDE 80 MG PO TABS
160.0000 mg | ORAL_TABLET | Freq: Three times a day (TID) | ORAL | Status: DC
  Administered 2015-02-04 (×2): 160 mg via ORAL
  Filled 2015-02-04 (×5): qty 2

## 2015-02-04 MED ORDER — PIPERACILLIN-TAZOBACTAM IN DEX 2-0.25 GM/50ML IV SOLN
2.2500 g | Freq: Four times a day (QID) | INTRAVENOUS | Status: DC
  Filled 2015-02-04: qty 50

## 2015-02-04 MED ORDER — PIPERACILLIN-TAZOBACTAM 3.375 G IVPB
3.3750 g | Freq: Three times a day (TID) | INTRAVENOUS | Status: DC
  Administered 2015-02-04 – 2015-02-07 (×9): 3.375 g via INTRAVENOUS
  Filled 2015-02-04 (×9): qty 50

## 2015-02-04 MED ORDER — MEGESTROL ACETATE 40 MG PO TABS
40.0000 mg | ORAL_TABLET | Freq: Every day | ORAL | Status: DC
  Administered 2015-02-04 – 2015-02-09 (×4): 40 mg via ORAL
  Filled 2015-02-04 (×7): qty 1

## 2015-02-04 NOTE — Progress Notes (Signed)
Nutrition Follow-up  DOCUMENTATION CODES:   Morbid obesity  INTERVENTION:  - Will d/c Prostat - Continue Boost Breeze - Will order Magic cup BID with meals, each supplement provides 290 kcal and 9 grams of protein - RD will continue to monitor for needs  NUTRITION DIAGNOSIS:   Inadequate oral intake related to lethargy/confusion, poor appetite as evidenced by meal completion < 50%. -ongoing mainly  GOAL:   Patient will meet greater than or equal to 90% of their needs -unmet on average  MONITOR:   Diet advancement, Weight trends, Labs, I & O's  ASSESSMENT:   69 y/o admitted in 10/05/2012 with Sepsis, hypotension, acute renal failure and acute cholecystitis. He had been place on Eliquis for his AF by the [place]. He had an MI 2 years ago and did not follow up with his cardiologist. He was acutely ill and underwent a percutaneous drain placement on 10/06/14 by IR. He went home on 10/11/14. He was readmitted on 12/30/14 and underwent laparoscopic cholecystectomy with IOC. He was left with a drain in place and the site had "SNOW' placed in the GB fossa for hemostasis after the procedure. Drain was removed last week but there was some brusing at the site. He is transferred from [facility] with complaints of increased abdominal pain.  8/19 Pt ate 100% of dinner yesterday which consisted of diet Sprite, deli sandwich with mayo, deli ham, and lettuce, vanilla ice cream, and 8 oz whole milk (578 kcal, 21.5 grams protein).  RN reports pt had nothing for breakfast so far this AM but he did drink 1/2 Boost Breeze (145 kcal, 4.5 grams protein).  Pt sleeping at time of visit and no family present. Will d/c Prostat as RN states pt has had several instances of refusal of this supplement and will order vanilla Magic Cup BID as pt seems to like vanilla ice cream.  Unable to determine if pt is fully meeting needs at this time but his intakes are improving. Medications reviewed. Labs reviewed; CBGs: 107-189  mg/dL, BUN/creatinine elevated, Cl: 99 mmol/L, Ca: 8.6 mg/dL, GFR: 16.    1/61 - Pt's diet advanced to Soft on 8/10 but pt has only been eating 5-10% on average.  - Pt reports abdominal pain which is consistent and not exacerbated by intakes.  - Family member asks about good options on Soft diet.  - Will order supplements; feel pt will do better with a clear liquid supplement at this time and will order Prostat to increase protein. - If poor intakes persist, recommend Panda tube with VITAL AF 1.2 @ 60 mL/hr to provide 1728 kcal (97% minimal needs), 108 grams protein, and 1168 mL free water  8/9 -Pt admitted 8/3 and on CLD or NPO since that date (6 days).  - Even on CLD pt has consumed very little and is unable to meet nutritional needs.  - CCM notes indicate oliguric renal failure and Hgb stable; pt previously with blood in abdomen. - Recommend Panda tube with TF as outlined above if possible. If unable to feed pt enterally, recommend TPN.   8/4 - Pt has had a poor appetite for 1 month PTA and las good meal was 8/2 - N/V with unknown start date and complaining of 10/10 pain recently  Diet Order:  DIET SOFT Room service appropriate?: Yes; Fluid consistency:: Thin  Skin:  Wound (see comment) (abdominal wound (first recorded 01/04/15))  Last BM:  8/18  Height:   Ht Readings from Last 1 Encounters:  01/20/15 5'  6" (1.676 m)    Weight:   Wt Readings from Last 1 Encounters:  02/03/15 269 lb 10 oz (122.3 kg)    Ideal Body Weight:  64.54 kg (kg)  BMI:  Body mass index is 43.54 kg/(m^2).  Estimated Nutritional Needs:   Kcal:  4174-0814  Protein:  95-105 grams  Fluid:  1.7-2.2 L/day  EDUCATION NEEDS:   No education needs identified at this time      Trenton Gammon, RD, LDN Inpatient Clinical Dietitian Pager # [phone] After hours/weekend pager # [phone]

## 2015-02-04 NOTE — Progress Notes (Signed)
Patient ID: Larry [name], male   DOB: 22-Dec-1945, 69 y.o.   MRN: [id number] [facility] PROGRESS NOTE  Larry [name] NWG:[id number] DOB: 04-Apr-1946 DOA: 01/19/2015 PCP: [name]  Brief narrative:    69 year old male with past medical history of atrial fibrillation (on anticoagulation with Eliquis), hypertension, dyslipidemia, diabetes. Patient underwent laparoscopic cholecystectomy on 01/03/2015. He subsequently developed infected gallbladder fossa hematoma and has underwent drain placement (cultures from the fluid analysis yielded mixed organisms). Patient presented to [facility] 01/19/2015 with septic shock and was then transferred to [facility] for management of infected hematoma. He has required pressor support on admission but has been off pressors since 01/21/2015.   During this hospital stay patient required 2 units of PRBC transfusion for increasing intraperitoneal hemorrhage on 01/23/2015.  Hospital course further complicated with yeast UTI as well as acute renal failure. Both, nephrology and ID are seeing the patietn in consultationl. TRH consulted for DM management.   Repeat CT abdomen done 02/03/15 demonstrated 9.8 cm hematoma with gas in the gallbladder fossa with indwelling pigtail drainage catheter, mildly decreased, 12.6 cm hematoma along the posterior right liver, mildly increased, moderate abdominopelvic ascites. His drain was changed to larger size by IR 02/04/2015.   Assessment/Plan:    Principal Problem: Septic shock secondary to complicated cholecystitis status post cholecystectomy 01/03/2015 with infected hematoma / Leukocytosis - Patient was in septic shock on admission which was thought to be secondary to infected gallbladder fossa hematoma status post recent cholecystectomy and drain placement  - Due to ongoing pain, CT repeated 8/18 and demonstrated 9.8 cm hematoma with gas in the gallbladder fossa with indwelling pigtail drainage catheter, mildly decreased, 12.6 cm hematoma  along the posterior right liver, mildly increased, moderate abdominopelvic ascites.  - The drain was changed to larger size by IR 02/04/2015. - ID is managing the antibiotics. Pt is currently on zosyn.  Active Problems: Yeast UTI - Continue fluconazole 100 mg daily as per ID recommendations   Acute hypoxic respiratory failure secondary to volume overload and anasarca - Likely from initial fluid resuscitation for septic shock - Stable respiratory status - No pulmonary edema seen on CXR done 01/30/15 - Continue lasix 80 mg IV Q 8 hours per renal  - Weight in past 72 hours: 122.2 kg --> 127.4 kg --> 122.3 kg - Continue daily weight and strict intake and output  Acute renal failure - Likely from ATN associated with sepsis during the initial phase of hospitalization.  - Creatinine steadily trending down - Renal following    Hypokalemia/hypomagnesemia - Secondary to lasix - Supplemented   Anemia of chronic disease / Acute blood loss anemia - Due to anticoagulation and hematoma - Hemoglobin is 7.8 this am - So far pt has received 2 units of PRBC during this hospital stay (on 01/23/2015) - Aranesp given every Wednesday (last dose 8/17) - Feraheme IV given Q 72 hours (last given 8/17)  History of atrial fibrillation - CHADS vasc score at least 3 - On eliquis at home but now on IV heparin - Rate controlled   Diabetes mellitus type 2, uncontrolled - A1c 8.2 indicating poor glycemic control - Pt currently on Lantus 35 units at bedtime and SSI  Morbid obesity - Body mass index is 45.35 kg/(m^2). - Nutrition consulted    DVT Prophylaxis  - IV heparin    Code Status: Full.  Family Communication:  plan of care discussed with the patient  Disposition Plan: Per primary team   IV access:  Peripheral IV  Procedures and diagnostic studies:     CT's Ct Abdomen Pelvis Wo Contrast 02/03/2015   9.8 cm hematoma with gas in the gallbladder fossa, with indwelling pigtail drainage catheter,  mildly decreased.  12.6 cm hematoma along the posterior right liver, mildly increased.  Moderate abdominopelvic ascites, stable versus mildly increased.  Mild wall thickening involving the right colon, possibly secondary to ascites and right upper quadrant inflammation.   Electronically Signed   By: Charline Bills M.D.   On: 02/03/2015 11:22   Ct Abdomen Pelvis Wo Contrast 01/28/2015  The gallbladder fossa hematoma/abscess has not significantly changed in size. Stable position of the percutaneous drainage catheter.  Stable appearance of the large perihepatic or subcapsular liver hematoma. Additional hematomas along the right anterior abdomen are stable. No significant change in the amount of ascites. Again noted is a small amount of perisplenic fluid.  Bilateral pleural effusions, right side greater the left. Stable volume loss and consolidation in the right lower lobe.   Electronically Signed   By: Richarda Overlie M.D.   On: 01/28/2015 07:23   Ct Abdomen Pelvis Wo Contrast 01/22/2015 Post percutaneous drainage 's of a gallbladder fossa abscess collection, collection now larger and higher in attenuation than on the previous study question interval hemorrhage.  Additionally, increased flow void with high density layering noted perihepatic compatible with increased intraperitoneal hemorrhage.  Increased ascites.  Mild increase in RIGHT pleural effusion and basilar atelectasis.  Findings called to Dr. Gerrit Friends on 01/22/2015 at 1704 hr.   Electronically Signed   By: Ulyses Southward M.D.   On: 01/22/2015 17:06   Ct Abdomen Pelvis W Contrast 01/13/2015 1. Gas and fluid at the cholecystectomy site following Reese removal of the drainage catheter is likely within normal limits. No discrete abscess or organized fluid collection is present. Follow-up CT scan in 1-2 weeks or nuclear medicine hepatobiliary scan could be used for further evaluation if the patient's symptoms persist 2. Minimal ascites about the liver is likely  reactive. No significant focal attenuation differences are evident adjacent to the cholecystectomy site. 3. Extensive atherosclerotic changes including coronary artery disease. 4. Small right pleural effusion and associated atelectasis. 5. Degenerative change within the thoracolumbar spine as described.     CXR's Dg Chest Port 1 View 01/30/2015  Right IJ central line with tip in right atrium again noted. No pulmonary edema. There is chronic elevation of the right hemidiaphragm with right basilar atelectasis.  Dg Chest Port 1 View 01/21/2015   Persistent RIGHT basilar atelectasis.   Electronically Signed   By: Ulyses Southward M.D.   On: 01/21/2015 08:33   Dg Chest Port 1 View 01/20/2015   1. New right IJ central line with tip at the right atrial level. No pneumothorax. 2. Low lung volumes with bibasilar atelectasis or pneumonia.   Electronically Signed   By: Marnee Spring M.D.   On: 01/20/2015 17:08   Dg Chest Port 1 View 01/19/2015    Mild opacification in the right perihilar region and medial right base which may be due to atelectasis or infection.   Electronically Signed   By: Elberta Fortis M.D.   On: 01/19/2015 16:19   Procedures: Ct Image Guided Drainage Percut Cath  Peritoneal Retroperit 01/20/2015  Successful CT-guided gallbladder fossa abscess drain insertion.    Medical Consultants:  Surgery Nephrology Infectious disease Interventional radiology   Other Consultants:  Diabetic coordinator Nutrition  PT/OT  IAnti-Infectives:   Diflucan Zosyn    Manson Passey, MD  [facility] Pager [phone]  Time spent in minutes: 25 minutes  If 7PM-7AM, please contact night-coverage [email or web address] Password [email or web address] 02/04/2015, 6:54 AM   LOS: 16 days    HPI/Subjective: No acute overnight events. Patient reported pain to be 7/10 this am. Objective: Filed Vitals:   02/04/15 0000 02/04/15 0200 02/04/15 0330 02/04/15 0336  BP: 146/65 102/50 118/52   Pulse: 109 107 102   Temp:    98.2 F  (36.8 C)  TempSrc:    Oral  Resp: 26 27 29    Height:      Weight:      SpO2: 92% 97% 94%     Intake/Output Summary (Last 24 hours) at 02/04/15 0654 Last data filed at 02/04/15 0641  Gross per 24 hour  Intake  533.7 ml  Output   3040 ml  Net -2506.3 ml    Exam:   General:  Pt is alert, not in acute distress  Cardiovascular: Regular rate and rhythm, S1/S2 (+)  Respiratory: Bilateral air entry, no wheezing  Abdomen: drain in right abdomen (+), non tender abdomen, (+) BS  Extremities: +1-2 LE pitting edema, bilateral pulses appreciated   Neuro: No focal neurologic deficits   Data Reviewed: Basic Metabolic Panel:  Recent Labs Lab 01/31/15 0500 02/01/15 0418 02/02/15 0352 02/03/15 0359 02/04/15 0240  NA 137 136 136 137 136  K 3.2* 3.2* 3.4* 3.6 3.5  CL 103 101 101 101 99*  CO2 26 27 27 27 28   GLUCOSE 170* 200* 187* 136* 147*  BUN 48* 48* 50* 49* 50*  CREATININE 3.80* 3.92* 3.94* 3.79* 3.63*  CALCIUM 8.4* 8.4* 8.5* 8.8* 8.6*  MG 1.6* 1.5* 1.6* 1.6* 1.5*  PHOS 3.8 3.9 3.7 3.6 3.8   Liver Function Tests:  Recent Labs Lab 01/29/15 0517 01/30/15 0440  AST 19 16  ALT 16* 14*  ALKPHOS 73 65  BILITOT 1.5* 1.3*  PROT 5.9* 5.6*  ALBUMIN 2.1* 2.1*   No results for input(s): LIPASE, AMYLASE in the last 168 hours. No results for input(s): AMMONIA in the last 168 hours. CBC:  Recent Labs Lab 01/31/15 0500 02/01/15 0418 02/02/15 0352 02/03/15 0359 02/04/15 0240  WBC 13.4* 14.6* 13.4* 13.3* 14.5*  NEUTROABS 11.5* 12.2* 11.3* 11.1* 12.7*  HGB 7.6* 7.6* 7.6* 8.1* 7.8*  HCT 23.8* 23.8* 24.1* 24.9* 23.9*  MCV 89.5 89.5 89.3 88.9 89.2  PLT 338 368 378 398 360   Cardiac Enzymes: No results for input(s): CKTOTAL, CKMB, CKMBINDEX, TROPONINI in the last 168 hours. BNP: Invalid input(s): POCBNP CBG:  Recent Labs Lab 02/03/15 1222 02/03/15 1655 02/03/15 1930 02/03/15 2329 02/04/15 0334  GLUCAP 107* 130* 156* 138* 144*    Urine culture     Status:  None   Collection Time: 01/30/15 11:40 AM  Result Value Ref Range Status   Specimen Description URINE, RANDOM  Final   Special Requests NONE  Final   Culture   Final    >=100,000 COLONIES/mL YEAST Performed at [facility]    Report Status 02/01/2015 FINAL  Final     . darbepoetin (ARANESP) injection - NON-DIALYSIS  200 mcg Subcutaneous Q Wed-1800  . feeding supplement  1 Container Oral TID BM  . feeding supplement (PRO-STAT SUGAR FREE 64)  30 mL Oral BID  . ferumoxytol  510 mg Intravenous Q72H  . fluconazole  100 mg Oral Q24H  . furosemide  80 mg Intravenous 3 times per day  . insulin aspart  0-15 Units Subcutaneous 6 times per day  .  insulin glargine  35 Units Subcutaneous QHS  . magnesium oxide  400 mg Oral BID  . nystatin   Topical TID  . ondansetron  8 mg Oral BID  . piperacillin-tazobactam (ZOSYN)  IV  2.25 g Intravenous Q8H  . potassium chloride  40 mEq Oral TID     Continuous Infusions: . sodium chloride 10 mL/hr at 01/31/15 0508  . heparin 2,400 Units/hr (02/04/15 0641)

## 2015-02-04 NOTE — Progress Notes (Signed)
ANTICOAGULATION CONSULT NOTE - Follow Up Consult  Pharmacy Consult for Heparin Indication: atrial fibrillation  No Known Allergies  Patient Measurements: Height:  (167.6 cm) Weight: 269 lb 10 oz (122.3 kg) IBW/kg (Calculated) : 63.8 Heparin Dosing Weight:   Vital Signs: Temp: 98.2 F (36.8 C) (08/19 0336) Temp Source: Oral (08/19 0336) BP: 118/52 mmHg (08/19 0330) Pulse Rate: 102 (08/19 0330)  Labs:  Recent Labs  02/02/15 0350  02/02/15 0352 02/03/15 0359 02/04/15 0240  HGB  --   < > 7.6* 8.1* 7.8*  HCT  --   --  24.1* 24.9* 23.9*  PLT  --   --  378 398 360  HEPARINUNFRC 0.32  --   --  0.28* 0.23*  CREATININE  --   --  3.94* 3.79* 3.63*  < > = values in this interval not displayed.  Estimated Creatinine Clearance: 24 mL/min (by C-G formula based on Cr of 3.63).   Medications:  Infusions:  . sodium chloride 10 mL/hr at 01/31/15 0508  . heparin      Assessment: Patient with heparin level below goal.  No heparin issues per RN.  Goal of Therapy:  Heparin level 0.3-0.7 units/ml Monitor platelets by anticoagulation protocol: Yes   Plan:  Increase heparin to 2400 units/hr Recheck level at [address], Larry [name] 02/04/2015,5:24 AM
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
## 2015-02-04 NOTE — Progress Notes (Signed)
INFECTIOUS DISEASE PROGRESS NOTE  ID: Larry [name] is a 69 y.o. male with  Principal Problem:   Shock circulatory Active Problems:   Diabetes mellitus without complication   HTN (hypertension)   Paroxysmal atrial fibrillation   Obesity   Intra-abdominal abscess   Paroxysmal a-fib   Sepsis   Acute respiratory distress   Intra-abdominal hematoma   Acute on chronic kidney failure   Acute blood loss anemia   Catheter-associated urinary tract infection   Abdominal abscess   Intra-abdominal infection   Hematoma   Candidiasis of urogenital site   Acute respiratory failure with hypoxia   Infected hematoma following procedure   Leukocytosis   Protein-calorie malnutrition  Subjective: C/o pain at drain site.   Abtx:  Anti-infectives    Start     Dose/Rate Route Frequency Ordered Stop   02/04/15 1200  piperacillin-tazobactam (ZOSYN) IVPB 2.25 g  Status:  Discontinued     2.25 g 100 mL/hr over 30 Minutes Intravenous 4 times per day 02/04/15 0859 02/04/15 0905   02/04/15 1200  piperacillin-tazobactam (ZOSYN) IVPB 3.375 g     3.375 g 12.5 mL/hr over 240 Minutes Intravenous Every 8 hours 02/04/15 0905     02/02/15 2000  fluconazole (DIFLUCAN) tablet 100 mg     100 mg Oral Every 24 hours 02/02/15 1908 02/09/15 1959   02/02/15 1800  fluconazole (DIFLUCAN) tablet 100 mg  Status:  Discontinued     100 mg Oral Daily 02/02/15 1545 02/02/15 1901   01/31/15 1000  fluconazole (DIFLUCAN) tablet 100 mg  Status:  Discontinued     100 mg Oral Daily 01/31/15 0727 02/02/15 1313   01/29/15 1700  piperacillin-tazobactam (ZOSYN) IVPB 2.25 g  Status:  Discontinued     2.25 g 100 mL/hr over 30 Minutes Intravenous Every 8 hours 01/29/15 0845 02/04/15 0859   01/24/15 2200  vancomycin (VANCOCIN) IVPB 1000 mg/200 mL premix     1,000 mg 200 mL/hr over 60 Minutes Intravenous  Once 01/24/15 1349 01/24/15 2301   01/21/15 2000  vancomycin (VANCOCIN) 1,500 mg in sodium chloride 0.9 % 500 mL IVPB   Status:  Discontinued     1,500 mg 250 mL/hr over 120 Minutes Intravenous Every 24 hours 01/21/15 1048 01/23/15 1726   01/20/15 2000  vancomycin (VANCOCIN) 1,250 mg in sodium chloride 0.9 % 250 mL IVPB  Status:  Discontinued     1,250 mg 166.7 mL/hr over 90 Minutes Intravenous Every 24 hours 01/19/15 1945 01/20/15 0843   01/20/15 2000  vancomycin (VANCOCIN) 1,250 mg in sodium chloride 0.9 % 250 mL IVPB  Status:  Discontinued     1,250 mg 166.7 mL/hr over 90 Minutes Intravenous Every 24 hours 01/20/15 1519 01/21/15 1048   01/20/15 0000  piperacillin-tazobactam (ZOSYN) IVPB 3.375 g  Status:  Discontinued     3.375 g 12.5 mL/hr over 240 Minutes Intravenous Every 8 hours 01/19/15 1946 01/29/15 0845   01/19/15 1715  vancomycin (VANCOCIN) 2,500 mg in sodium chloride 0.9 % 500 mL IVPB     2,500 mg 250 mL/hr over 120 Minutes Intravenous  Once 01/19/15 1707 01/19/15 2159   01/19/15 1715  piperacillin-tazobactam (ZOSYN) IVPB 3.375 g     3.375 g 100 mL/hr over 30 Minutes Intravenous  Once 01/19/15 1707 01/19/15 1900      Medications:  Scheduled: . antiseptic oral rinse  7 mL Mouth Rinse BID  . darbepoetin (ARANESP) injection - NON-DIALYSIS  200 mcg Subcutaneous Q Wed-1800  . diphenhydrAMINE  12.5 mg  Intravenous Once  . feeding supplement  1 Container Oral TID BM  . ferumoxytol  510 mg Intravenous Q72H  . fluconazole  100 mg Oral Q24H  . furosemide  160 mg Oral TID  . insulin aspart  0-15 Units Subcutaneous 6 times per day  . insulin glargine  35 Units Subcutaneous QHS  . magnesium oxide  400 mg Oral BID  . megestrol  40 mg Oral Daily  . nystatin   Topical TID  . ondansetron  8 mg Oral BID  . piperacillin-tazobactam (ZOSYN)  IV  3.375 g Intravenous Q8H    Objective: Vital signs in last 24 hours: Temp:  [98.2 F (36.8 C)-99.2 F (37.3 C)] 98.3 F (36.8 C) (08/19 1200) Pulse Rate:  [92-109] 102 (08/19 1000) Resp:  [19-33] 33 (08/19 1000) BP: (102-149)/(50-76) 138/64 mmHg (08/19  1000) SpO2:  [92 %-98 %] 94 % (08/19 1000)   General appearance: alert, cooperative, mild distress and morbidly obese Resp: clear to auscultation bilaterally Cardio: regular rate and rhythm GI: normal findings: bowel sounds normal and soft, non-tender and abnormal findings:  obese and drain in RUQ. mild intertrigo at pannus.  Extremities: edema trace LE  Lab Results  Recent Labs  02/03/15 0359 02/04/15 0240  WBC 13.3* 14.5*  HGB 8.1* 7.8*  HCT 24.9* 23.9*  NA 137 136  K 3.6 3.5  CL 101 99*  CO2 27 28  BUN 49* 50*  CREATININE 3.79* 3.63*   Liver Panel No results for input(s): PROT, ALBUMIN, AST, ALT, ALKPHOS, BILITOT, BILIDIR, IBILI in the last 72 hours. Sedimentation Rate No results for input(s): ESRSEDRATE in the last 72 hours. C-Reactive Protein No results for input(s): CRP in the last 72 hours.  Microbiology: Recent Results (from the past 240 hour(s))  Urine culture     Status: None   Collection Time: 01/30/15 11:40 AM  Result Value Ref Range Status   Specimen Description URINE, RANDOM  Final   Special Requests NONE  Final   Culture   Final    >=100,000 COLONIES/mL YEAST Performed at [facility]    Report Status 02/01/2015 FINAL  Final    Studies/Results: Ct Abdomen Pelvis Wo Contrast  02/03/2015   CLINICAL DATA:  Leukocytosis, status post laparoscopic cholecystectomy on 07/18, complicated by infected hematoma in gallbladder fossa  EXAM: CT ABDOMEN AND PELVIS WITHOUT CONTRAST  TECHNIQUE: Multidetector CT imaging of the abdomen and pelvis was performed following the standard protocol without IV contrast.  COMPARISON:  01/27/2015  FINDINGS: Lower chest: Moderate right pleural effusion. Associated right lower lobe opacity, likely compressive atelectasis.  Trace left pleural effusion with associated dependent atelectasis.  Hepatobiliary: 2.0 x 3.1 cm subcapsular fluid collection along the posterior aspect of the left hepatic lobe (series 2/ image 26) previously  1.7 x 2.4 cm.  Subcapsular hematoma along the posterior liver. This measures 3.9 x 10.4 cm along the posterior right hepatic dome (series 2/image 19), previously 3.1 x 7.9 cm, and 8.7 x 12.6 cm inferior to the right hepatic lobe (series 2/ image 41), previously 7.4 x 12.0 cm.  9.8 x 9.2 cm hematoma with gas in the gallbladder fossa (series 2/image 30), previously 9.8 x 10.0 cm, with indwelling pigtail drainage catheter.  Pancreas: Within normal limits.  Spleen: Within normal limits.  Adrenals/Urinary Tract: Adrenal glands unremarkable.  Kidneys are grossly unremarkable.  No renal, ureteral, or bladder calculi.  No hydronephrosis.  Bladder is decompressed by indwelling Foley catheter.  Stomach/Bowel: Stomach is notable for a small hiatal  hernia.  No evidence of bowel obstruction.  Wall thickening involving the right colon (series 2/image 38), possibly secondary to ascites and right upper quadrant inflammation.  Vascular/Lymphatic: Atherosclerotic calcifications of the abdominal aorta and branch vessels.  No suspicious abdominopelvic lymphadenopathy.  Reproductive: Prostate is grossly unremarkable.  Other: Moderate abdominopelvic ascites, stable versus mildly increased.  Musculoskeletal: Degenerative changes of the visualized thoracolumbar spine.  Right hip arthroplasty.  IMPRESSION: 9.8 cm hematoma with gas in the gallbladder fossa, with indwelling pigtail drainage catheter, mildly decreased.  12.6 cm hematoma along the posterior right liver, mildly increased.  Moderate abdominopelvic ascites, stable versus mildly increased.  Mild wall thickening involving the right colon, possibly secondary to ascites and right upper quadrant inflammation.   Electronically Signed   By: Charline Bills M.D.   On: 02/03/2015 11:22     Assessment/Plan: Gallbladder fossa hematoma, abscess Would continue zosyn Cx polymicrobial new, larger drain: 140cc out.  Repeat Cx sent.  WBC steady, minimal change  ATN with ARF Watch  his Cr, slightly better today Appreciate Dr Eliane Decree excellent care Appreciate pharm assistance with decreasing zosyn dose.   Funguria Foley placed, on diflucan  Intertrigo Continue diflucan for 7 days Cholecystectomy April 2016  Protein Calorie Malnutrition (alb 2.1) Consider rechecking, continue diet per surgery Having diarrhea which he attributes to Ensure (after each can) Check C diff only if change in status: fever, WBC   Total days of antibiotics: 16 Zosyn 8/3 ---> Fluconazole 8/17 ---> 8/24  Dr Orvan Falconer is available if questions over w/e.          Johny Sax Infectious Diseases (pager) [phone] [email or web address] 02/04/2015, 3:46 PM  LOS: 16 days

## 2015-02-04 NOTE — Progress Notes (Signed)
Subjective: Interval History: has no complaint .  Objective: Vital signs in last 24 hours: Temp:  [98.2 F (36.8 C)-99.2 F (37.3 C)] 98.4 F (36.9 C) (08/19 0700) Pulse Rate:  [92-109] 102 (08/19 1000) Resp:  [19-33] 33 (08/19 1000) BP: (102-149)/(50-76) 138/64 mmHg (08/19 1000) SpO2:  [92 %-98 %] 94 % (08/19 1000) Weight change:   Intake/Output from previous day: 08/18 0701 - 08/19 0700 In: 876.5 [P.O.:444; I.V.:372.5; IV Piggyback:50] Out: 3035 [Urine:2900; Drains:135] Intake/Output this shift: Total I/O In: 107 [I.V.:102; Other:5] Out: 305 [Urine:300; Drains:5]  General appearance: cooperative, morbidly obese, pale and slowed mentation Resp: diminished breath sounds bilaterally Cardio: irregularly irregular rhythm and systolic murmur: holosystolic 2/6, blowing at apex GI: obese, pos  bs, liver down 6 cm , drain RUQ Extremities: edema 2+  Lab Results:  Recent Labs  02/03/15 0359 02/04/15 0240  WBC 13.3* 14.5*  HGB 8.1* 7.8*  HCT 24.9* 23.9*  PLT 398 360   BMET:  Recent Labs  02/03/15 0359 02/04/15 0240  NA 137 136  K 3.6 3.5  CL 101 99*  CO2 27 28  GLUCOSE 136* 147*  BUN 49* 50*  CREATININE 3.79* 3.63*  CALCIUM 8.8* 8.6*   No results for input(s): PTH in the last 72 hours. Iron Studies:  Recent Labs  02/02/15 0350  IRON 20*  TIBC 151*  FERRITIN 972*    Studies/Results: Ct Abdomen Pelvis Wo Contrast  02/03/2015   CLINICAL DATA:  Leukocytosis, status post laparoscopic cholecystectomy on 07/18, complicated by infected hematoma in gallbladder fossa  EXAM: CT ABDOMEN AND PELVIS WITHOUT CONTRAST  TECHNIQUE: Multidetector CT imaging of the abdomen and pelvis was performed following the standard protocol without IV contrast.  COMPARISON:  01/27/2015  FINDINGS: Lower chest: Moderate right pleural effusion. Associated right lower lobe opacity, likely compressive atelectasis.  Trace left pleural effusion with associated dependent atelectasis.  Hepatobiliary:  2.0 x 3.1 cm subcapsular fluid collection along the posterior aspect of the left hepatic lobe (series 2/ image 26) previously 1.7 x 2.4 cm.  Subcapsular hematoma along the posterior liver. This measures 3.9 x 10.4 cm along the posterior right hepatic dome (series 2/image 19), previously 3.1 x 7.9 cm, and 8.7 x 12.6 cm inferior to the right hepatic lobe (series 2/ image 41), previously 7.4 x 12.0 cm.  9.8 x 9.2 cm hematoma with gas in the gallbladder fossa (series 2/image 30), previously 9.8 x 10.0 cm, with indwelling pigtail drainage catheter.  Pancreas: Within normal limits.  Spleen: Within normal limits.  Adrenals/Urinary Tract: Adrenal glands unremarkable.  Kidneys are grossly unremarkable.  No renal, ureteral, or bladder calculi.  No hydronephrosis.  Bladder is decompressed by indwelling Foley catheter.  Stomach/Bowel: Stomach is notable for a small hiatal hernia.  No evidence of bowel obstruction.  Wall thickening involving the right colon (series 2/image 38), possibly secondary to ascites and right upper quadrant inflammation.  Vascular/Lymphatic: Atherosclerotic calcifications of the abdominal aorta and branch vessels.  No suspicious abdominopelvic lymphadenopathy.  Reproductive: Prostate is grossly unremarkable.  Other: Moderate abdominopelvic ascites, stable versus mildly increased.  Musculoskeletal: Degenerative changes of the visualized thoracolumbar spine.  Right hip arthroplasty.  IMPRESSION: 9.8 cm hematoma with gas in the gallbladder fossa, with indwelling pigtail drainage catheter, mildly decreased.  12.6 cm hematoma along the posterior right liver, mildly increased.  Moderate abdominopelvic ascites, stable versus mildly increased.  Mild wall thickening involving the right colon, possibly secondary to ascites and right upper quadrant inflammation.   Electronically Signed   By:  Charline Bills M.D.   On: 02/03/2015 11:22    I have reviewed the patient's current medications.  Assessment/Plan: 1  AKI now over 3 wk. Cr a little better, no signiif GFR change.  Vol xs, can [place] po diuretics. No t sure how much better he will get. 2 Low Mg on po 3 RUQ abscess drain in place , AB 4 Afib anticoag 5 anemia stable P po lasix , drain, AB    LOS: 16 days   Miranda Frese L 02/04/2015,12:02 PM
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
## 2015-02-04 NOTE — Progress Notes (Signed)
Subjective: Still with some right sided pain, hurts with movement. No n/v. +bm. Got RUQ drain upsized yesterday for persistent hematoma - had 135cc out since new drain placed. cx sent and still pending. Reportedly ate quite well last night.   Objective: Vital signs in last 24 hours: Temp:  [98.2 F (36.8 C)-99.2 F (37.3 C)] 98.3 F (36.8 C) (08/19 1200) Pulse Rate:  [92-109] 102 (08/19 1000) Resp:  [19-33] 33 (08/19 1000) BP: (102-149)/(50-76) 138/64 mmHg (08/19 1000) SpO2:  [92 %-98 %] 94 % (08/19 1000) Last BM Date: 02/03/15  Intake/Output from previous day: 08/18 0701 - 08/19 0700 In: 876.5 [P.O.:444; I.V.:372.5; IV Piggyback:50] Out: 3035 [Urine:2900; Drains:135] Intake/Output this shift: Total I/O In: 107 [I.V.:102; Other:5] Out: 305 [Urine:300; Drains:5]  Alert, nad, appropriate cta /bl Reg Soft, obese, mild RUQ ttp; drain - old blood Essentially resolved b/l LE pitting edema  Lab Results:   Recent Labs  02/03/15 0359 02/04/15 0240  WBC 13.3* 14.5*  HGB 8.1* 7.8*  HCT 24.9* 23.9*  PLT 398 360   BMET  Recent Labs  02/03/15 0359 02/04/15 0240  NA 137 136  K 3.6 3.5  CL 101 99*  CO2 27 28  GLUCOSE 136* 147*  BUN 49* 50*  CREATININE 3.79* 3.63*  CALCIUM 8.8* 8.6*   PT/INR No results for input(s): LABPROT, INR in the last 72 hours. ABG No results for input(s): PHART, HCO3 in the last 72 hours.  Invalid input(s): PCO2, PO2  Studies/Results: Ct Abdomen Pelvis Wo Contrast  02/03/2015   CLINICAL DATA:  Leukocytosis, status post laparoscopic cholecystectomy on 07/18, complicated by infected hematoma in gallbladder fossa  EXAM: CT ABDOMEN AND PELVIS WITHOUT CONTRAST  TECHNIQUE: Multidetector CT imaging of the abdomen and pelvis was performed following the standard protocol without IV contrast.  COMPARISON:  01/27/2015  FINDINGS: Lower chest: Moderate right pleural effusion. Associated right lower lobe opacity, likely compressive atelectasis.  Trace  left pleural effusion with associated dependent atelectasis.  Hepatobiliary: 2.0 x 3.1 cm subcapsular fluid collection along the posterior aspect of the left hepatic lobe (series 2/ image 26) previously 1.7 x 2.4 cm.  Subcapsular hematoma along the posterior liver. This measures 3.9 x 10.4 cm along the posterior right hepatic dome (series 2/image 19), previously 3.1 x 7.9 cm, and 8.7 x 12.6 cm inferior to the right hepatic lobe (series 2/ image 41), previously 7.4 x 12.0 cm.  9.8 x 9.2 cm hematoma with gas in the gallbladder fossa (series 2/image 30), previously 9.8 x 10.0 cm, with indwelling pigtail drainage catheter.  Pancreas: Within normal limits.  Spleen: Within normal limits.  Adrenals/Urinary Tract: Adrenal glands unremarkable.  Kidneys are grossly unremarkable.  No renal, ureteral, or bladder calculi.  No hydronephrosis.  Bladder is decompressed by indwelling Foley catheter.  Stomach/Bowel: Stomach is notable for a small hiatal hernia.  No evidence of bowel obstruction.  Wall thickening involving the right colon (series 2/image 38), possibly secondary to ascites and right upper quadrant inflammation.  Vascular/Lymphatic: Atherosclerotic calcifications of the abdominal aorta and branch vessels.  No suspicious abdominopelvic lymphadenopathy.  Reproductive: Prostate is grossly unremarkable.  Other: Moderate abdominopelvic ascites, stable versus mildly increased.  Musculoskeletal: Degenerative changes of the visualized thoracolumbar spine.  Right hip arthroplasty.  IMPRESSION: 9.8 cm hematoma with gas in the gallbladder fossa, with indwelling pigtail drainage catheter, mildly decreased.  12.6 cm hematoma along the posterior right liver, mildly increased.  Moderate abdominopelvic ascites, stable versus mildly increased.  Mild wall thickening involving the right colon,  possibly secondary to ascites and right upper quadrant inflammation.   Electronically Signed   By: Charline Bills M.D.   On: 02/03/2015 11:22     Anti-infectives: Anti-infectives    Start     Dose/Rate Route Frequency Ordered Stop   02/04/15 1200  piperacillin-tazobactam (ZOSYN) IVPB 2.25 g  Status:  Discontinued     2.25 g 100 mL/hr over 30 Minutes Intravenous 4 times per day 02/04/15 0859 02/04/15 0905   02/04/15 1200  piperacillin-tazobactam (ZOSYN) IVPB 3.375 g     3.375 g 12.5 mL/hr over 240 Minutes Intravenous Every 8 hours 02/04/15 0905     02/02/15 2000  fluconazole (DIFLUCAN) tablet 100 mg     100 mg Oral Every 24 hours 02/02/15 1908 02/09/15 1959   02/02/15 1800  fluconazole (DIFLUCAN) tablet 100 mg  Status:  Discontinued     100 mg Oral Daily 02/02/15 1545 02/02/15 1901   01/31/15 1000  fluconazole (DIFLUCAN) tablet 100 mg  Status:  Discontinued     100 mg Oral Daily 01/31/15 0727 02/02/15 1313   01/29/15 1700  piperacillin-tazobactam (ZOSYN) IVPB 2.25 g  Status:  Discontinued     2.25 g 100 mL/hr over 30 Minutes Intravenous Every 8 hours 01/29/15 0845 02/04/15 0859   01/24/15 2200  vancomycin (VANCOCIN) IVPB 1000 mg/200 mL premix     1,000 mg 200 mL/hr over 60 Minutes Intravenous  Once 01/24/15 1349 01/24/15 2301   01/21/15 2000  vancomycin (VANCOCIN) 1,500 mg in sodium chloride 0.9 % 500 mL IVPB  Status:  Discontinued     1,500 mg 250 mL/hr over 120 Minutes Intravenous Every 24 hours 01/21/15 1048 01/23/15 1726   01/20/15 2000  vancomycin (VANCOCIN) 1,250 mg in sodium chloride 0.9 % 250 mL IVPB  Status:  Discontinued     1,250 mg 166.7 mL/hr over 90 Minutes Intravenous Every 24 hours 01/19/15 1945 01/20/15 0843   01/20/15 2000  vancomycin (VANCOCIN) 1,250 mg in sodium chloride 0.9 % 250 mL IVPB  Status:  Discontinued     1,250 mg 166.7 mL/hr over 90 Minutes Intravenous Every 24 hours 01/20/15 1519 01/21/15 1048   01/20/15 0000  piperacillin-tazobactam (ZOSYN) IVPB 3.375 g  Status:  Discontinued     3.375 g 12.5 mL/hr over 240 Minutes Intravenous Every 8 hours 01/19/15 1946 01/29/15 0845   01/19/15 1715   vancomycin (VANCOCIN) 2,500 mg in sodium chloride 0.9 % 500 mL IVPB     2,500 mg 250 mL/hr over 120 Minutes Intravenous  Once 01/19/15 1707 01/19/15 2159   01/19/15 1715  piperacillin-tazobactam (ZOSYN) IVPB 3.375 g     3.375 g 100 mL/hr over 30 Minutes Intravenous  Once 01/19/15 1707 01/19/15 1900      Assessment/Plan: S/p interval lap cholecystectomy 7/18 GB fossa infected hematoma - cont drain, f/u repeat drain cx. If output remains low, will talk with IR on Monday about TPA instillation into drain. Cont zosyn for now per ID. F/u ID recs ARF on CKD - Cr starting to trend down, good uop. Cont foley for now. Edema resolved. Will need to see how long renal wants to keep on scheduled lasix??? Anemia - hgb stable. Got feraheme, and getting aranesp q2weeks Moderate PCMN - cont calorie counts. Cont diet and Resource. Encouraged PO. Will add megace Yeast UTI - diflucan til 8/24 per ID Paroxsymal afib - on IV heparin, per a previous renal note - not a candidat for eliquis anymore. Will start/hold on coumadin til early next week since may  need to instill TPA in hematoma.   PT/OT!! OOB Will see if we can pull central line and use peripheral IV.  Plan SNF mid next week Updated wife at [facility] Appreciated consultant services.   [name]. [name], MD, FACS General, Bariatric, & Minimally Invasive Surgery [facility], [place]     LOS: 16 days    [name] 02/04/2015

## 2015-02-05 ENCOUNTER — Inpatient Hospital Stay (HOSPITAL_COMMUNITY): Payer: Medicare Other

## 2015-02-05 DIAGNOSIS — T148 Other injury of unspecified body region: Secondary | ICD-10-CM

## 2015-02-05 DIAGNOSIS — T888XXD Other specified complications of surgical and medical care, not elsewhere classified, subsequent encounter: Secondary | ICD-10-CM

## 2015-02-05 DIAGNOSIS — E43 Unspecified severe protein-calorie malnutrition: Secondary | ICD-10-CM

## 2015-02-05 LAB — URINE MICROSCOPIC-ADD ON

## 2015-02-05 LAB — GLUCOSE, CAPILLARY
GLUCOSE-CAPILLARY: 114 mg/dL — AB (ref 65–99)
GLUCOSE-CAPILLARY: 153 mg/dL — AB (ref 65–99)
GLUCOSE-CAPILLARY: 92 mg/dL (ref 65–99)
Glucose-Capillary: 173 mg/dL — ABNORMAL HIGH (ref 65–99)
Glucose-Capillary: 161 mg/dL — ABNORMAL HIGH (ref 65–99)

## 2015-02-05 LAB — BASIC METABOLIC PANEL
ANION GAP: 8 (ref 5–15)
BUN: 48 mg/dL — ABNORMAL HIGH (ref 6–20)
CALCIUM: 8.4 mg/dL — AB (ref 8.9–10.3)
CO2: 29 mmol/L (ref 22–32)
CREATININE: 3.95 mg/dL — AB (ref 0.61–1.24)
Chloride: 99 mmol/L — ABNORMAL LOW (ref 101–111)
GFR, EST AFRICAN AMERICAN: 17 mL/min — AB (ref >60)
GFR, EST NON AFRICAN AMERICAN: 14 mL/min — AB (ref >60)
Glucose, Bld: 115 mg/dL — ABNORMAL HIGH (ref 65–99)
Potassium: 3.3 mmol/L — ABNORMAL LOW (ref 3.5–5.1)
SODIUM: 136 mmol/L (ref 135–145)

## 2015-02-05 LAB — CBC WITH DIFFERENTIAL/PLATELET
BASOS ABS: 0 10*3/uL (ref 0.0–0.1)
BASOS PCT: 0 % (ref 0–1)
EOS ABS: 0.1 10*3/uL (ref 0.0–0.7)
EOS PCT: 1 % (ref 0–5)
HCT: 22.4 % — ABNORMAL LOW (ref 39.0–52.0)
Hemoglobin: 7.3 g/dL — ABNORMAL LOW (ref 13.0–17.0)
Lymphocytes Relative: 4 % — ABNORMAL LOW (ref 12–46)
Lymphs Abs: 0.6 10*3/uL — ABNORMAL LOW (ref 0.7–4.0)
MCH: 29.4 pg (ref 26.0–34.0)
MCHC: 32.6 g/dL (ref 30.0–36.0)
MCV: 90.3 fL (ref 78.0–100.0)
MONO ABS: 1.1 10*3/uL — AB (ref 0.1–1.0)
MONOS PCT: 9 % (ref 3–12)
NEUTROS ABS: 11.5 10*3/uL — AB (ref 1.7–7.7)
Neutrophils Relative %: 86 % — ABNORMAL HIGH (ref 43–77)
PLATELETS: 369 10*3/uL (ref 150–400)
RBC: 2.48 MIL/uL — ABNORMAL LOW (ref 4.22–5.81)
RDW: 16.5 % — AB (ref 11.5–15.5)
WBC: 13.3 10*3/uL — ABNORMAL HIGH (ref 4.0–10.5)

## 2015-02-05 LAB — URINALYSIS, ROUTINE W REFLEX MICROSCOPIC
BILIRUBIN URINE: NEGATIVE
Glucose, UA: NEGATIVE mg/dL
Ketones, ur: NEGATIVE mg/dL
Nitrite: NEGATIVE
PH: 5 (ref 5.0–8.0)
Protein, ur: NEGATIVE mg/dL
SPECIFIC GRAVITY, URINE: 1.017 (ref 1.005–1.030)
UROBILINOGEN UA: 0.2 mg/dL (ref 0.0–1.0)

## 2015-02-05 LAB — HEPARIN LEVEL (UNFRACTIONATED): Heparin Unfractionated: 0.52 IU/mL (ref 0.30–0.70)

## 2015-02-05 LAB — MAGNESIUM: MAGNESIUM: 1.5 mg/dL — AB (ref 1.7–2.4)

## 2015-02-05 LAB — PHOSPHORUS: PHOSPHORUS: 4.9 mg/dL — AB (ref 2.5–4.6)

## 2015-02-05 MED ORDER — POTASSIUM CHLORIDE CRYS ER 20 MEQ PO TBCR
40.0000 meq | EXTENDED_RELEASE_TABLET | Freq: Once | ORAL | Status: AC
  Administered 2015-02-06: 40 meq via ORAL
  Filled 2015-02-05 (×2): qty 2

## 2015-02-05 MED ORDER — MAGNESIUM SULFATE 2 GM/50ML IV SOLN
2.0000 g | Freq: Once | INTRAVENOUS | Status: AC
  Administered 2015-02-05: 2 g via INTRAVENOUS
  Filled 2015-02-05: qty 50

## 2015-02-05 NOTE — Progress Notes (Signed)
ANTICOAGULATION CONSULT NOTE - Follow Up Consult  Pharmacy Consult for heparin Indication: atrial fibrillation  No Known Allergies  Patient Measurements: Height:  (167.6 cm) Weight: 263 lb 3.7 oz (119.4 kg) IBW/kg (Calculated) : 63.8 Heparin Dosing Weight:   Vital Signs: Temp: 99.4 F (37.4 C) (08/20 0000) Temp Source: Oral (08/20 0000) BP: 123/53 mmHg (08/19 2200) Pulse Rate: 101 (08/19 2200)  Labs:  Recent Labs  02/02/15 0352 02/03/15 0359 02/04/15 0240 02/04/15 1405 02/04/15 2200  HGB 7.6* 8.1* 7.8*  --   --   HCT 24.1* 24.9* 23.9*  --   --   PLT 378 398 360  --   --   HEPARINUNFRC  --  0.28* 0.23* 0.36 0.56  CREATININE 3.94* 3.79* 3.63*  --   --     Estimated Creatinine Clearance: 23.7 mL/min (by C-G formula based on Cr of 3.63).   Medications:  Infusions:  . sodium chloride 10 mL/hr at 01/31/15 0508  . heparin 2,400 Units/hr (02/05/15 0127)    Assessment: Patient with heparin level at goal.  No heparin issues noted.  Goal of Therapy:  Heparin level 0.3-0.7 units/ml Monitor platelets by anticoagulation protocol: Yes   Plan:  Continue heparin drip at current rate Recheck level at 0600  Darlina Guys, Jacquenette Shone Crowford 02/05/2015,2:49 AM

## 2015-02-05 NOTE — Progress Notes (Signed)
Subjective: Interval History: has complaints not doing as well , threw up.  More SOB.  Objective: Vital signs in last 24 hours: Temp:  [98.2 F (36.8 C)-100 F (37.8 C)] 100 F (37.8 C) (08/20 0400) Pulse Rate:  [93-106] 101 (08/20 0800) Resp:  [15-31] 25 (08/20 0830) BP: (91-133)/(37-74) 93/37 mmHg (08/20 0800) SpO2:  [84 %-100 %] 92 % (08/20 0830) Weight:  [119.4 kg (263 lb 3.7 oz)] 119.4 kg (263 lb 3.7 oz) (08/19 1800) Weight change:   Intake/Output from previous day: 08/19 0701 - 08/20 0700 In: 1079.5 [I.V.:919.5; IV Piggyback:150] Out: 812 [Urine:800; Drains:12] Intake/Output this shift: Total I/O In: 68 [I.V.:68] Out: -   General appearance: cooperative, morbidly obese, pale and dyspneic Resp: diminished breath sounds bilaterally and rales bibasilar Cardio: S1, S2 normal and systolic murmur: holosystolic 2/6, blowing at apex GI: obese, drain RUQ, pos bs Extremities: edema 2-3+  Lab Results:  Recent Labs  02/04/15 0240 02/05/15 0630  WBC 14.5* 13.3*  HGB 7.8* 7.3*  HCT 23.9* 22.4*  PLT 360 369   BMET:  Recent Labs  02/04/15 0240 02/05/15 0630  NA 136 136  K 3.5 3.3*  CL 99* 99*  CO2 28 29  GLUCOSE 147* 115*  BUN 50* 48*  CREATININE 3.63* 3.95*  CALCIUM 8.6* 8.4*   No results for input(s): PTH in the last 72 hours. Iron Studies: No results for input(s): IRON, TIBC, TRANSFERRIN, FERRITIN in the last 72 hours.  Studies/Results: No results found.  I have reviewed the patient's current medications.  Assessment/Plan: 1 AKI prolonged ATN will recheck urine to see if other process.  BP lower and suspect renal hypoperfusion so Cr ^, will hold Lasix 2 anemia 3 RUQ abscess on AB and drain 4 Dyspnea follow sats closely, 5 Obesity 6 DM P hold Lasix, give K, AB follow resp status closely    LOS: 17 days   Kingsly Kloepfer L 02/05/2015,11:14 AM

## 2015-02-05 NOTE — Progress Notes (Signed)
Patient ID: Larry [name], male   DOB: 1946/02/12, 69 y.o.   MRN: [id number] [facility] Surgery Progress Note:   * No surgery found *  Subjective: Mental status is fairly alert and talkative.   Objective: Vital signs in last 24 hours: Temp:  [98.2 F (36.8 C)-100 F (37.8 C)] 100 F (37.8 C) (08/20 0400) Pulse Rate:  [93-106] 102 (08/20 0600) Resp:  [15-33] 22 (08/20 0600) BP: (91-138)/(50-74) 99/52 mmHg (08/20 0600) SpO2:  [90 %-100 %] 92 % (08/20 0600) Weight:  [119.4 kg (263 lb 3.7 oz)] 119.4 kg (263 lb 3.7 oz) (08/19 1800)  Intake/Output from previous day: 08/19 0701 - 08/20 0700 In: 885.5 [I.V.:725.5; IV Piggyback:150] Out: 812 [Urine:800; Drains:12] Intake/Output this shift:    Physical Exam: Work of breathing is not labored but he is at bedrest.  Drain in GB is brownish bloody.    Lab Results:  Results for orders placed or performed during the hospital encounter of 01/19/15 (from the past 48 hour(s))  Glucose, capillary     Status: Abnormal   Collection Time: 02/03/15 12:22 PM  Result Value Ref Range   Glucose-Capillary 107 (H) 65 - 99 mg/dL  Glucose, capillary     Status: Abnormal   Collection Time: 02/03/15  4:55 PM  Result Value Ref Range   Glucose-Capillary 130 (H) 65 - 99 mg/dL  Glucose, capillary     Status: Abnormal   Collection Time: 02/03/15  7:30 PM  Result Value Ref Range   Glucose-Capillary 156 (H) 65 - 99 mg/dL  Glucose, capillary     Status: Abnormal   Collection Time: 02/03/15 11:29 PM  Result Value Ref Range   Glucose-Capillary 138 (H) 65 - 99 mg/dL  Magnesium     Status: Abnormal   Collection Time: 02/04/15  2:40 AM  Result Value Ref Range   Magnesium 1.5 (L) 1.7 - 2.4 mg/dL  Phosphorus     Status: None   Collection Time: 02/04/15  2:40 AM  Result Value Ref Range   Phosphorus 3.8 2.5 - 4.6 mg/dL  CBC with Differential/Platelet     Status: Abnormal   Collection Time: 02/04/15  2:40 AM  Result Value Ref Range   WBC 14.5 (H) 4.0 - 10.5  K/uL   RBC 2.68 (L) 4.22 - 5.81 MIL/uL   Hemoglobin 7.8 (L) 13.0 - 17.0 g/dL   HCT 23.9 (L) 39.0 - 52.0 %   MCV 89.2 78.0 - 100.0 fL   MCH 29.1 26.0 - 34.0 pg   MCHC 32.6 30.0 - 36.0 g/dL   RDW 16.3 (H) 11.5 - 15.5 %   Platelets 360 150 - 400 K/uL   Neutrophils Relative % 87 (H) 43 - 77 %   Neutro Abs 12.7 (H) 1.7 - 7.7 K/uL   Lymphocytes Relative 5 (L) 12 - 46 %   Lymphs Abs 0.7 0.7 - 4.0 K/uL   Monocytes Relative 7 3 - 12 %   Monocytes Absolute 1.0 0.1 - 1.0 K/uL   Eosinophils Relative 1 0 - 5 %   Eosinophils Absolute 0.1 0.0 - 0.7 K/uL   Basophils Relative 0 0 - 1 %   Basophils Absolute 0.0 0.0 - 0.1 K/uL  Basic metabolic panel     Status: Abnormal   Collection Time: 02/04/15  2:40 AM  Result Value Ref Range   Sodium 136 135 - 145 mmol/L   Potassium 3.5 3.5 - 5.1 mmol/L   Chloride 99 (L) 101 - 111 mmol/L   CO2  28 22 - 32 mmol/L   Glucose, Bld 147 (H) 65 - 99 mg/dL   BUN 50 (H) 6 - 20 mg/dL   Creatinine, Ser 3.63 (H) 0.61 - 1.24 mg/dL   Calcium 8.6 (L) 8.9 - 10.3 mg/dL   GFR calc non Af Amer 16 (L) >60 mL/min   GFR calc Af Amer 18 (L) >60 mL/min    Comment: (NOTE) The eGFR has been calculated using the CKD EPI equation. This calculation has not been validated in all clinical situations. eGFR's persistently <60 mL/min signify possible Chronic Kidney Disease.    Anion gap 9 5 - 15  Heparin level (unfractionated)     Status: Abnormal   Collection Time: 02/04/15  2:40 AM  Result Value Ref Range   Heparin Unfractionated 0.23 (L) 0.30 - 0.70 IU/mL    Comment:        IF HEPARIN RESULTS ARE BELOW EXPECTED VALUES, AND PATIENT DOSAGE HAS BEEN CONFIRMED, SUGGEST FOLLOW UP TESTING OF ANTITHROMBIN III LEVELS.   Glucose, capillary     Status: Abnormal   Collection Time: 02/04/15  3:34 AM  Result Value Ref Range   Glucose-Capillary 144 (H) 65 - 99 mg/dL   Comment 1 Notify RN   Glucose, capillary     Status: None   Collection Time: 02/04/15  9:32 AM  Result Value Ref Range    Glucose-Capillary 98 65 - 99 mg/dL   Comment 1 Notify RN    Comment 2 Document in Chart   Glucose, capillary     Status: Abnormal   Collection Time: 02/04/15 12:34 PM  Result Value Ref Range   Glucose-Capillary 158 (H) 65 - 99 mg/dL  Heparin level (unfractionated)     Status: None   Collection Time: 02/04/15  2:05 PM  Result Value Ref Range   Heparin Unfractionated 0.36 0.30 - 0.70 IU/mL    Comment:        IF HEPARIN RESULTS ARE BELOW EXPECTED VALUES, AND PATIENT DOSAGE HAS BEEN CONFIRMED, SUGGEST FOLLOW UP TESTING OF ANTITHROMBIN III LEVELS.   Glucose, capillary     Status: Abnormal   Collection Time: 02/04/15  4:06 PM  Result Value Ref Range   Glucose-Capillary 155 (H) 65 - 99 mg/dL  Glucose, capillary     Status: Abnormal   Collection Time: 02/04/15  7:36 PM  Result Value Ref Range   Glucose-Capillary 214 (H) 65 - 99 mg/dL  Heparin level (unfractionated)     Status: None   Collection Time: 02/04/15 10:00 PM  Result Value Ref Range   Heparin Unfractionated 0.56 0.30 - 0.70 IU/mL    Comment:        IF HEPARIN RESULTS ARE BELOW EXPECTED VALUES, AND PATIENT DOSAGE HAS BEEN CONFIRMED, SUGGEST FOLLOW UP TESTING OF ANTITHROMBIN III LEVELS.   Glucose, capillary     Status: Abnormal   Collection Time: 02/05/15 12:29 AM  Result Value Ref Range   Glucose-Capillary 153 (H) 65 - 99 mg/dL  Heparin level (unfractionated)     Status: None   Collection Time: 02/05/15  6:00 AM  Result Value Ref Range   Heparin Unfractionated 0.52 0.30 - 0.70 IU/mL    Comment:        IF HEPARIN RESULTS ARE BELOW EXPECTED VALUES, AND PATIENT DOSAGE HAS BEEN CONFIRMED, SUGGEST FOLLOW UP TESTING OF ANTITHROMBIN III LEVELS.   Magnesium     Status: Abnormal   Collection Time: 02/05/15  6:30 AM  Result Value Ref Range   Magnesium 1.5 (L) 1.7 -  2.4 mg/dL  Phosphorus     Status: Abnormal   Collection Time: 02/05/15  6:30 AM  Result Value Ref Range   Phosphorus 4.9 (H) 2.5 - 4.6 mg/dL  CBC with  Differential/Platelet     Status: Abnormal   Collection Time: 02/05/15  6:30 AM  Result Value Ref Range   WBC 13.3 (H) 4.0 - 10.5 K/uL   RBC 2.48 (L) 4.22 - 5.81 MIL/uL   Hemoglobin 7.3 (L) 13.0 - 17.0 g/dL   HCT 22.4 (L) 39.0 - 52.0 %   MCV 90.3 78.0 - 100.0 fL   MCH 29.4 26.0 - 34.0 pg   MCHC 32.6 30.0 - 36.0 g/dL   RDW 16.5 (H) 11.5 - 15.5 %   Platelets 369 150 - 400 K/uL   Neutrophils Relative % 86 (H) 43 - 77 %   Neutro Abs 11.5 (H) 1.7 - 7.7 K/uL   Lymphocytes Relative 4 (L) 12 - 46 %   Lymphs Abs 0.6 (L) 0.7 - 4.0 K/uL   Monocytes Relative 9 3 - 12 %   Monocytes Absolute 1.1 (H) 0.1 - 1.0 K/uL   Eosinophils Relative 1 0 - 5 %   Eosinophils Absolute 0.1 0.0 - 0.7 K/uL   Basophils Relative 0 0 - 1 %   Basophils Absolute 0.0 0.0 - 0.1 K/uL  Basic metabolic panel     Status: Abnormal   Collection Time: 02/05/15  6:30 AM  Result Value Ref Range   Sodium 136 135 - 145 mmol/L   Potassium 3.3 (L) 3.5 - 5.1 mmol/L   Chloride 99 (L) 101 - 111 mmol/L   CO2 29 22 - 32 mmol/L   Glucose, Bld 115 (H) 65 - 99 mg/dL   BUN 48 (H) 6 - 20 mg/dL   Creatinine, Ser 3.95 (H) 0.61 - 1.24 mg/dL   Calcium 8.4 (L) 8.9 - 10.3 mg/dL   GFR calc non Af Amer 14 (L) >60 mL/min   GFR calc Af Amer 17 (L) >60 mL/min    Comment: (NOTE) The eGFR has been calculated using the CKD EPI equation. This calculation has not been validated in all clinical situations. eGFR's persistently <60 mL/min signify possible Chronic Kidney Disease.    Anion gap 8 5 - 15    Radiology/Results: Ct Abdomen Pelvis Wo Contrast  02/03/2015   CLINICAL DATA:  Leukocytosis, status post laparoscopic cholecystectomy on 27/07, complicated by infected hematoma in gallbladder fossa  EXAM: CT ABDOMEN AND PELVIS WITHOUT CONTRAST  TECHNIQUE: Multidetector CT imaging of the abdomen and pelvis was performed following the standard protocol without IV contrast.  COMPARISON:  01/27/2015  FINDINGS: Lower chest: Moderate right pleural effusion.  Associated right lower lobe opacity, likely compressive atelectasis.  Trace left pleural effusion with associated dependent atelectasis.  Hepatobiliary: 2.0 x 3.1 cm subcapsular fluid collection along the posterior aspect of the left hepatic lobe (series 2/ image 26) previously 1.7 x 2.4 cm.  Subcapsular hematoma along the posterior liver. This measures 3.9 x 10.4 cm along the posterior right hepatic dome (series 2/image 19), previously 3.1 x 7.9 cm, and 8.7 x 12.6 cm inferior to the right hepatic lobe (series 2/ image 41), previously 7.4 x 12.0 cm.  9.8 x 9.2 cm hematoma with gas in the gallbladder fossa (series 2/image 30), previously 9.8 x 10.0 cm, with indwelling pigtail drainage catheter.  Pancreas: Within normal limits.  Spleen: Within normal limits.  Adrenals/Urinary Tract: Adrenal glands unremarkable.  Kidneys are grossly unremarkable.  No renal, ureteral, or  bladder calculi.  No hydronephrosis.  Bladder is decompressed by indwelling Foley catheter.  Stomach/Bowel: Stomach is notable for a small hiatal hernia.  No evidence of bowel obstruction.  Wall thickening involving the right colon (series 2/image 38), possibly secondary to ascites and right upper quadrant inflammation.  Vascular/Lymphatic: Atherosclerotic calcifications of the abdominal aorta and branch vessels.  No suspicious abdominopelvic lymphadenopathy.  Reproductive: Prostate is grossly unremarkable.  Other: Moderate abdominopelvic ascites, stable versus mildly increased.  Musculoskeletal: Degenerative changes of the visualized thoracolumbar spine.  Right hip arthroplasty.  IMPRESSION: 9.8 cm hematoma with gas in the gallbladder fossa, with indwelling pigtail drainage catheter, mildly decreased.  12.6 cm hematoma along the posterior right liver, mildly increased.  Moderate abdominopelvic ascites, stable versus mildly increased.  Mild wall thickening involving the right colon, possibly secondary to ascites and right upper quadrant inflammation.    Electronically Signed   By: Julian Hy M.D.   On: 02/03/2015 11:22    Anti-infectives: Anti-infectives    Start     Dose/Rate Route Frequency Ordered Stop   02/04/15 1200  piperacillin-tazobactam (ZOSYN) IVPB 2.25 g  Status:  Discontinued     2.25 g 100 mL/hr over 30 Minutes Intravenous 4 times per day 02/04/15 0859 02/04/15 0905   02/04/15 1200  piperacillin-tazobactam (ZOSYN) IVPB 3.375 g     3.375 g 12.5 mL/hr over 240 Minutes Intravenous Every 8 hours 02/04/15 0905     02/02/15 2000  fluconazole (DIFLUCAN) tablet 100 mg     100 mg Oral Every 24 hours 02/02/15 1908 02/09/15 1959   02/02/15 1800  fluconazole (DIFLUCAN) tablet 100 mg  Status:  Discontinued     100 mg Oral Daily 02/02/15 1545 02/02/15 1901   01/31/15 1000  fluconazole (DIFLUCAN) tablet 100 mg  Status:  Discontinued     100 mg Oral Daily 01/31/15 0727 02/02/15 1313   01/29/15 1700  piperacillin-tazobactam (ZOSYN) IVPB 2.25 g  Status:  Discontinued     2.25 g 100 mL/hr over 30 Minutes Intravenous Every 8 hours 01/29/15 0845 02/04/15 0859   01/24/15 2200  vancomycin (VANCOCIN) IVPB 1000 mg/200 mL premix     1,000 mg 200 mL/hr over 60 Minutes Intravenous  Once 01/24/15 1349 01/24/15 2301   01/21/15 2000  vancomycin (VANCOCIN) 1,500 mg in sodium chloride 0.9 % 500 mL IVPB  Status:  Discontinued     1,500 mg 250 mL/hr over 120 Minutes Intravenous Every 24 hours 01/21/15 1048 01/23/15 1726   01/20/15 2000  vancomycin (VANCOCIN) 1,250 mg in sodium chloride 0.9 % 250 mL IVPB  Status:  Discontinued     1,250 mg 166.7 mL/hr over 90 Minutes Intravenous Every 24 hours 01/19/15 1945 01/20/15 0843   01/20/15 2000  vancomycin (VANCOCIN) 1,250 mg in sodium chloride 0.9 % 250 mL IVPB  Status:  Discontinued     1,250 mg 166.7 mL/hr over 90 Minutes Intravenous Every 24 hours 01/20/15 1519 01/21/15 1048   01/20/15 0000  piperacillin-tazobactam (ZOSYN) IVPB 3.375 g  Status:  Discontinued     3.375 g 12.5 mL/hr over 240 Minutes  Intravenous Every 8 hours 01/19/15 1946 01/29/15 0845   01/19/15 1715  vancomycin (VANCOCIN) 2,500 mg in sodium chloride 0.9 % 500 mL IVPB     2,500 mg 250 mL/hr over 120 Minutes Intravenous  Once 01/19/15 1707 01/19/15 2159   01/19/15 1715  piperacillin-tazobactam (ZOSYN) IVPB 3.375 g     3.375 g 100 mL/hr over 30 Minutes Intravenous  Once 01/19/15 1707 01/19/15 1900  Assessment/Plan: Problem List: Patient Active Problem List   Diagnosis Date Noted  . Acute respiratory failure with hypoxia   . Infected hematoma following procedure   . Leukocytosis   . Protein-calorie malnutrition   . Hematoma   . Candidiasis of urogenital site   . Abdominal abscess   . Intra-abdominal infection   . Acute on chronic kidney failure 01/31/2015  . Acute blood loss anemia 01/31/2015  . Catheter-associated urinary tract infection 01/31/2015  . Intra-abdominal hematoma   . Acute respiratory distress   . Sepsis   . Intra-abdominal abscess 01/19/2015  . Paroxysmal a-fib 01/19/2015  . Shock circulatory 01/19/2015  . Obesity 01/06/2015  . Anticoagulated 01/06/2015  . Chronic cholecystitis 01/04/2015  . Preoperative clearance 12/16/2014  . Paroxysmal atrial fibrillation   . CAD (coronary artery disease) 10/06/2014  . Acute cholecystitis   . Dyspnea   . Septic shock   . Cholecystitis 10/05/2014  . Diabetes mellitus without complication 82/02/9067  . HTN (hypertension) 10/05/2014  . Hypotension 10/05/2014    Will try to get up into chair and walk with PT * No surgery found *    LOS: 17 days   Matt B. Hassell Done, MD, [facility] [phone] beeper [phone]  02/05/2015 8:06 AM

## 2015-02-05 NOTE — Progress Notes (Signed)
OT Cancellation Note  Patient Details Name: Larry [name] MRN: [id number] DOB: [birth year]   Cancelled Treatment:    Reason Eval/Treat Not Completed: Fatigue/lethargy limiting ability to participate  Catharine Kettlewell 02/05/2015, 2:05 PM  Marica Otter, OTR/L [phone] 02/05/2015

## 2015-02-05 NOTE — Progress Notes (Signed)
Referring Physician(s): CCS  Chief Complaint: GB fossa infected hematoma s/p perc drain  Subjective: Patient c/o abdominal pain at drain site  Allergies: Review of patient's allergies indicates no known allergies.  Medications: Prior to Admission medications   Medication Sig Start Date End Date Taking? Authorizing Provider  allopurinol (ZYLOPRIM) 300 MG tablet Take 300 mg by mouth daily.   Yes Historical Provider, MD  amLODipine (NORVASC) 10 MG tablet Take 10 mg by mouth every morning.    Yes Historical Provider, MD  apixaban (ELIQUIS) 5 MG TABS tablet Take 1 tablet (5 mg total) by mouth 2 (two) times daily. 10/01/14  Yes April Palumbo, MD  aspirin 81 MG tablet Take 81 mg by mouth daily.   Yes Historical Provider, MD  atorvastatin (LIPITOR) 80 MG tablet Take 80 mg by mouth at bedtime.    Yes Historical Provider, MD  Cholecalciferol (VITAMIN D PO) Take 2,000 Units by mouth daily.    Yes Historical Provider, MD  citalopram (CELEXA) 40 MG tablet Take 40 mg by mouth every morning.    Yes Historical Provider, MD  furosemide (LASIX) 40 MG tablet Take 40 mg by mouth every morning.    Yes Historical Provider, MD  glucose 4 GM chewable tablet Chew 1 tablet by mouth as needed for low blood sugar (only if BS IS BELOW 70).   Yes Historical Provider, MD  insulin glargine (LANTUS) 100 UNIT/ML injection Inject 40 Units into the skin at bedtime.   Yes Historical Provider, MD  magnesium oxide (MAG-OX) 400 MG tablet Take 400 mg by mouth 2 (two) times daily.    Yes Historical Provider, MD  metFORMIN (GLUCOPHAGE) 500 MG tablet Take 500 mg by mouth 2 (two) times daily with a meal.   Yes Historical Provider, MD  oxyCODONE-acetaminophen (PERCOCET/ROXICET) 5-325 MG per tablet Take 1-2 tablets by mouth every 4 (four) hours as needed for moderate pain. 01/05/15  Yes [name], MD  pantoprazole (PROTONIX) 40 MG tablet Take 40 mg by mouth daily.   Yes Historical Provider, MD  vitamin B-12 (CYANOCOBALAMIN) 500  MCG tablet Take 500 mcg by mouth daily.   Yes Historical Provider, MD  Liraglutide 18 MG/3ML SOPN Inject 1.2 mg into the skin daily. Pt states has been on hold since discharged 10-06-14 hospital visit    Historical Provider, MD  lisinopril (PRINIVIL,ZESTRIL) 40 MG tablet Take 40 mg by mouth 2 (two) times daily. On hold since 10-06-14    Historical Provider, MD  ondansetron (ZOFRAN) 8 MG tablet Take 8 mg by mouth 2 (two) times daily. Not taking-on hold form 10-06-14    Historical Provider, MD   Vital Signs: BP 93/37 mmHg  Pulse 101  Temp(Src) 100 F (37.8 C) (Axillary)  Resp 25  Ht 5\' 6"  (1.676 m)  Wt 263 lb 3.7 oz (119.4 kg)  BMI 42.51 kg/m2  SpO2 92%  Physical Exam General: Alert, awake Abd: Distended, RUQ drain intact dark bloody output < 5cc in bag, TTP  Imaging: Ct Abdomen Pelvis Wo Contrast  02/03/2015   CLINICAL DATA:  Leukocytosis, status post laparoscopic cholecystectomy on 07/18, complicated by infected hematoma in gallbladder fossa  EXAM: CT ABDOMEN AND PELVIS WITHOUT CONTRAST  TECHNIQUE: Multidetector CT imaging of the abdomen and pelvis was performed following the standard protocol without IV contrast.  COMPARISON:  01/27/2015  FINDINGS: Lower chest: Moderate right pleural effusion. Associated right lower lobe opacity, likely compressive atelectasis.  Trace left pleural effusion with associated dependent atelectasis.  Hepatobiliary: 2.0 x 3.1  cm subcapsular fluid collection along the posterior aspect of the left hepatic lobe (series 2/ image 26) previously 1.7 x 2.4 cm.  Subcapsular hematoma along the posterior liver. This measures 3.9 x 10.4 cm along the posterior right hepatic dome (series 2/image 19), previously 3.1 x 7.9 cm, and 8.7 x 12.6 cm inferior to the right hepatic lobe (series 2/ image 41), previously 7.4 x 12.0 cm.  9.8 x 9.2 cm hematoma with gas in the gallbladder fossa (series 2/image 30), previously 9.8 x 10.0 cm, with indwelling pigtail drainage catheter.  Pancreas:  Within normal limits.  Spleen: Within normal limits.  Adrenals/Urinary Tract: Adrenal glands unremarkable.  Kidneys are grossly unremarkable.  No renal, ureteral, or bladder calculi.  No hydronephrosis.  Bladder is decompressed by indwelling Foley catheter.  Stomach/Bowel: Stomach is notable for a small hiatal hernia.  No evidence of bowel obstruction.  Wall thickening involving the right colon (series 2/image 38), possibly secondary to ascites and right upper quadrant inflammation.  Vascular/Lymphatic: Atherosclerotic calcifications of the abdominal aorta and branch vessels.  No suspicious abdominopelvic lymphadenopathy.  Reproductive: Prostate is grossly unremarkable.  Other: Moderate abdominopelvic ascites, stable versus mildly increased.  Musculoskeletal: Degenerative changes of the visualized thoracolumbar spine.  Right hip arthroplasty.  IMPRESSION: 9.8 cm hematoma with gas in the gallbladder fossa, with indwelling pigtail drainage catheter, mildly decreased.  12.6 cm hematoma along the posterior right liver, mildly increased.  Moderate abdominopelvic ascites, stable versus mildly increased.  Mild wall thickening involving the right colon, possibly secondary to ascites and right upper quadrant inflammation.   Electronically Signed   By: Charline Bills M.D.   On: 02/03/2015 11:22    Labs:  CBC:  Recent Labs  02/02/15 0352 02/03/15 0359 02/04/15 0240 02/05/15 0630  WBC 13.4* 13.3* 14.5* 13.3*  HGB 7.6* 8.1* 7.8* 7.3*  HCT 24.1* 24.9* 23.9* 22.4*  PLT 378 398 360 369    COAGS:  Recent Labs  10/06/14 0445 01/19/15 1613 01/23/15 0754  INR 1.07 1.46 1.32  APTT 34 35 38*    BMP:  Recent Labs  02/02/15 0352 02/03/15 0359 02/04/15 0240 02/05/15 0630  NA 136 137 136 136  K 3.4* 3.6 3.5 3.3*  CL 101 101 99* 99*  CO2 GLUCOSE 187* 136* 147* 115*  BUN 50* 49* 50* 48*  CALCIUM 8.5* 8.8* 8.6* 8.4*  CREATININE 3.94* 3.79* 3.63* 3.95*  GFRNONAA 14* 15* 16* 14*    GFRAA 17* 17* 18* 17*    LIVER FUNCTION TESTS:  Recent Labs  01/27/15 0345 01/28/15 0320 01/29/15 0517 01/30/15 0440  BILITOT 1.7* 1.7* 1.5* 1.3*  AST ALT 22 19 16* 14*  ALKPHOS 77 70 73 65  PROT 6.1* 6.0* 5.9* 5.6*  ALBUMIN 2.3* 2.2* 2.1* 2.1*    Assessment and Plan: S/p Lap chole 7/19 GB fossa infected hematoma S/p perc drain 01/20/2015 interval hemorrhage Low output, s/p upsize of perc drain 8/18, H/H stable Dark bloody 135cc output now trending down to <20cc again, wbc trend is down 13 (14), still with temp Continue flushes and monitor output  IR will follow   Signed: Berneta Levins 02/05/2015, 9:55 AM   I spent a total of 15 Minutes at the the patient's bedside AND on the patient's hospital floor or unit, greater than 50% of which was counseling/coordinating care for GB fossa infected hematoma
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
## 2015-02-05 NOTE — Progress Notes (Signed)
PT Cancellation Note  Patient Details Name: Larry [name] MRN: [id number] DOB: Oct 06, 1945   Cancelled Treatment:    Reason Eval/Treat Not Completed: Medical issues which prohibited therapy (lethargic, RN reports that patient is not doing well for mobility.)   Rada Hay 02/05/2015, 2:30 PM Blanchard Kelch PT [phone]

## 2015-02-05 NOTE — Progress Notes (Addendum)
Patient ID: Larry [name], male   DOB: 1945-10-09, 69 y.o.   MRN: [id number] [facility] PROGRESS NOTE  Larry [name] WUJ:[id number] DOB: 1945-11-27 DOA: 01/19/2015 PCP: [name]  Brief narrative:    69 year old male with past medical history of atrial fibrillation (on anticoagulation with Eliquis), hypertension, dyslipidemia, diabetes. Patient underwent laparoscopic cholecystectomy on 01/03/2015. He subsequently developed infected gallbladder fossa hematoma and has underwent drain placement (cultures from the fluid analysis yielded mixed organisms). Patient presented to [facility] 01/19/2015 with septic shock and was then transferred to [facility] for management of infected hematoma. He has required pressor support on admission but has been off pressors since 01/21/2015.   During this hospital stay patient required 2 units of PRBC transfusion for increasing intraperitoneal hemorrhage on 01/23/2015.  Hospital course further complicated with yeast UTI as well as acute renal failure. Both, nephrology and ID are seeing the patietn in consultationl. [facility] consulted for DM management.   Repeat CT abdomen done 02/03/15 demonstrated 9.8 cm hematoma with gas in the gallbladder fossa with indwelling pigtail drainage catheter, mildly decreased, 12.6 cm hematoma along the posterior right liver, mildly increased, moderate abdominopelvic ascites. His drain was changed to larger size by IR 02/04/2015.   Assessment/Plan:    Principal Problem: Septic shock secondary to complicated cholecystitis status post cholecystectomy 01/03/2015 with infected hematoma / Leukocytosis - Septic shock present on admission thought to be secondary to infected gallbladder fossa hematoma status post recent cholecystectomy and drain placement  - CT repeated 8/18 and demonstrated 9.8 cm hematoma with gas in the gallbladder fossa with indwelling pigtail drainage catheter, mildly decreased, 12.6 cm hematoma along the posterior right liver, mildly  increased, moderate abdominopelvic ascites. - IR changed to drain to larger on 02/04/2015 - Appreciate ID following, currently on zosyn and diflucan     Active Problems: Yeast UTI - Continue fluconazole  Acute hypoxic respiratory failure secondary to volume overload and anasarca - Likely from initial fluid resuscitation for septic shock - No pulmonary edema on CXR done 01/30/15 - Per renal transitioned to po lasix on 8/19 - Continue daily weight and strict intake and output  Acute renal failure - Likely from ATN associated with sepsis during the initial phase of hospitalization.  - Creatinine improving - Now on PO lasix 160 mg TID  Hypokalemia/hypomagnesemia - Secondary to lasix - Supplemented   Anemia of chronic disease / Acute blood loss anemia - Due to anticoagulation and hematoma - CBC pending this am  - S/P 2 units of PRBC (on 01/23/2015) - Aranesp given every Wednesday (last dose 8/17) - Feraheme IV given Q 72 hours (last given 8/17)  History of atrial fibrillation - CHADS vasc score at least 3 - On eliquis at home but now on IV heparin - Rate controlled   Diabetes mellitus type 2, uncontrolled - A1c 8.2 indicating poor glycemic control - Continue Lantus 35 units at bedtime and SSI - CBG's in past 24 hours: 155, 214, 153  Morbid obesity - Body mass index is 45.35 kg/(m^2). - Nutrition consulted    DVT Prophylaxis  - IV heparin    Code Status: Full.  Family Communication:  plan of care discussed with the patient  Disposition Plan: Per primary team   IV access:  Peripheral IV  Procedures and diagnostic studies:     CT's Ct Abdomen Pelvis Wo Contrast 02/03/2015   9.8 cm hematoma with gas in the gallbladder fossa, with indwelling pigtail drainage catheter, mildly decreased.  12.6 cm hematoma  along the posterior right liver, mildly increased.  Moderate abdominopelvic ascites, stable versus mildly increased.  Mild wall thickening involving the right colon,  possibly secondary to ascites and right upper quadrant inflammation.   Electronically Signed   By: Charline Bills M.D.   On: 02/03/2015 11:22   Ct Abdomen Pelvis Wo Contrast 01/28/2015  The gallbladder fossa hematoma/abscess has not significantly changed in size. Stable position of the percutaneous drainage catheter.  Stable appearance of the large perihepatic or subcapsular liver hematoma. Additional hematomas along the right anterior abdomen are stable. No significant change in the amount of ascites. Again noted is a small amount of perisplenic fluid.  Bilateral pleural effusions, right side greater the left. Stable volume loss and consolidation in the right lower lobe.   Electronically Signed   By: Richarda Overlie M.D.   On: 01/28/2015 07:23   Ct Abdomen Pelvis Wo Contrast 01/22/2015 Post percutaneous drainage 's of a gallbladder fossa abscess collection, collection now larger and higher in attenuation than on the previous study question interval hemorrhage.  Additionally, increased flow void with high density layering noted perihepatic compatible with increased intraperitoneal hemorrhage.  Increased ascites.  Mild increase in RIGHT pleural effusion and basilar atelectasis.  Findings called to Dr. Gerrit Friends on 01/22/2015 at 1704 hr.   Electronically Signed   By: Ulyses Southward M.D.   On: 01/22/2015 17:06   Ct Abdomen Pelvis W Contrast 01/13/2015 1. Gas and fluid at the cholecystectomy site following Reese removal of the drainage catheter is likely within normal limits. No discrete abscess or organized fluid collection is present. Follow-up CT scan in 1-2 weeks or nuclear medicine hepatobiliary scan could be used for further evaluation if the patient's symptoms persist 2. Minimal ascites about the liver is likely reactive. No significant focal attenuation differences are evident adjacent to the cholecystectomy site. 3. Extensive atherosclerotic changes including coronary artery disease. 4. Small right pleural effusion  and associated atelectasis. 5. Degenerative change within the thoracolumbar spine as described.     CXR's Dg Chest Port 1 View 01/30/2015  Right IJ central line with tip in right atrium again noted. No pulmonary edema. There is chronic elevation of the right hemidiaphragm with right basilar atelectasis.  Dg Chest Port 1 View 01/21/2015   Persistent RIGHT basilar atelectasis.   Electronically Signed   By: Ulyses Southward M.D.   On: 01/21/2015 08:33   Dg Chest Port 1 View 01/20/2015   1. New right IJ central line with tip at the right atrial level. No pneumothorax. 2. Low lung volumes with bibasilar atelectasis or pneumonia.   Electronically Signed   By: Marnee Spring M.D.   On: 01/20/2015 17:08   Dg Chest Port 1 View 01/19/2015    Mild opacification in the right perihilar region and medial right base which may be due to atelectasis or infection.   Electronically Signed   By: Elberta Fortis M.D.   On: 01/19/2015 16:19   Procedures: Ct Image Guided Drainage Percut Cath  Peritoneal Retroperit 01/20/2015  Successful CT-guided gallbladder fossa abscess drain insertion.    Medical Consultants:  Surgery Nephrology Infectious disease Interventional radiology   Other Consultants:  Diabetic coordinator Nutrition  PT/OT  IAnti-Infectives:   Diflucan Yetta Barre, MD  [facility] Pager [phone]  Time spent in minutes: 15 minutes  If 7PM-7AM, please contact night-coverage [email or web address] Password [email or web address] 02/05/2015, 7:03 AM   LOS: 17 days    HPI/Subjective: No acute overnight events. Patient sleeping this  am.  Objective: Filed Vitals:   02/05/15 0000 02/05/15 0135 02/05/15 0400 02/05/15 0600  BP:   97/56 99/52  Pulse:   102 102  Temp: 99.4 F (37.4 C)  100 F (37.8 C)   TempSrc: Oral  Axillary   Resp:   23 22  Height:      Weight:      SpO2:  92% 90% 92%    Intake/Output Summary (Last 24 hours) at 02/05/15 0703 Last data filed at 02/05/15 0600  Gross per 24 hour   Intake 885.53 ml  Output    812 ml  Net  73.53 ml    Exam:   General:  Pt is not in acute distress  Cardiovascular: Rate controlled, (+) S1, S2  Respiratory: No wheezing, no rhonchi  Abdomen: drain in place on the right side of abdomen, (+) BS  Extremities: +1-2 LE pitting edema, pulses palpable   Neuro: Nonfocal  Data Reviewed: Basic Metabolic Panel:  Recent Labs Lab 01/31/15 0500 02/01/15 0418 02/02/15 0352 02/03/15 0359 02/04/15 0240  NA 137 136 136 137 136  K 3.2* 3.2* 3.4* 3.6 3.5  CL 103 101 101 101 99*  CO2 GLUCOSE 170* 200* 187* 136* 147*  BUN 48* 48* 50* 49* 50*  CREATININE 3.80* 3.92* 3.94* 3.79* 3.63*  CALCIUM 8.4* 8.4* 8.5* 8.8* 8.6*  MG 1.6* 1.5* 1.6* 1.6* 1.5*  PHOS 3.8 3.9 3.7 3.6 3.8   Liver Function Tests:  Recent Labs Lab 01/30/15 0440  AST 16  ALT 14*  ALKPHOS 65  BILITOT 1.3*  PROT 5.6*  ALBUMIN 2.1*   No results for input(s): LIPASE, AMYLASE in the last 168 hours. No results for input(s): AMMONIA in the last 168 hours. CBC:  Recent Labs Lab 01/31/15 0500 02/01/15 0418 02/02/15 0352 02/03/15 0359 02/04/15 0240  WBC 13.4* 14.6* 13.4* 13.3* 14.5*  NEUTROABS 11.5* 12.2* 11.3* 11.1* 12.7*  HGB 7.6* 7.6* 7.6* 8.1* 7.8*  HCT 23.8* 23.8* 24.1* 24.9* 23.9*  MCV 89.5 89.5 89.3 88.9 89.2  PLT 338 368 378 398 360   Cardiac Enzymes: No results for input(s): CKTOTAL, CKMB, CKMBINDEX, TROPONINI in the last 168 hours. BNP: Invalid input(s): POCBNP CBG:  Recent Labs Lab 02/04/15 0932 02/04/15 1234 02/04/15 1606 02/04/15 1936 02/05/15 0029  GLUCAP 98 158* 155* 214* 153*   Culture, routine-abscess     Status: None   Collection Time: 01/20/15  1:00 PM  Result Value Ref Range Status   Specimen Description ABSCESS GALL BLADDER FOSSA  Final   Special Requests Normal  Final   Gram Stain   Final   Culture   Final    MULTIPLE ORGANISMS PRESENT, NONE PREDOMINANT Note: NO STAPHYLOCOCCUS AUREUS ISOLATED NO GROUP A  STREP (S.PYOGENES) ISOLATED Performed at [facility]    Report Status 01/23/2015 FINAL  Final  Anaerobic culture     Status: None   Collection Time: 01/20/15  1:00 PM  Result Value Ref Range Status   Specimen Description ABSCESS GALL BLADDER FOSSA  Final   Special Requests Normal  Final   Gram Stain   Final   Culture   Final    NO ANAEROBES ISOLATED Performed at [facility]    Report Status 01/26/2015 FINAL  Final  MRSA PCR Screening     Status: None   Collection Time: 01/20/15  1:39 PM  Result Value Ref Range Status   MRSA by PCR NEGATIVE NEGATIVE Final  Culture, blood (routine x  2)     Status: None   Collection Time: 01/20/15  1:50 PM  Result Value Ref Range Status   Specimen Description BLOOD RIGHT ARM  Final   Special Requests IN PEDIATRIC BOTTLE 4CC  Final   Culture   Final    NO GROWTH 5 DAYS Performed at [facility]    Report Status 01/25/2015 FINAL  Final  Culture, blood (routine x 2)     Status: None   Collection Time: 01/20/15  1:55 PM  Result Value Ref Range Status   Specimen Description BLOOD RIGHT HAND  Final   Special Requests IN PEDIATRIC BOTTLE 4CC  Final   Culture   Final    NO GROWTH 5 DAYS Performed at [facility]    Report Status 01/25/2015 FINAL  Final  Urine culture     Status: None   Collection Time: 01/30/15 11:40 AM  Result Value Ref Range Status   Specimen Description URINE, RANDOM  Final   Special Requests NONE  Final   Culture   Final    >=100,000 COLONIES/mL YEAST Performed at [facility]    Report Status 02/01/2015 FINAL  Final      . antiseptic oral rinse  7 mL Mouth Rinse BID  . darbepoetin (ARANESP) injection - NON-DIALYSIS  200 mcg Subcutaneous Q Wed-1800  . diphenhydrAMINE  12.5 mg Intravenous Once  . feeding supplement  1 Container Oral TID BM  . ferumoxytol  510 mg Intravenous Q72H  . fluconazole  100 mg Oral Q24H  . furosemide  160 mg Oral TID  . insulin aspart  0-15 Units  Subcutaneous 6 times per day  . insulin glargine  35 Units Subcutaneous QHS  . magnesium oxide  400 mg Oral BID  . megestrol  40 mg Oral Daily  . nystatin   Topical TID  . ondansetron  8 mg Oral BID  . piperacillin-tazobactam (ZOSYN)  IV  3.375 g Intravenous Q8H     Continuous Infusions: . sodium chloride 10 mL/hr at 01/31/15 0508  . heparin 2,400 Units/hr (02/05/15 0508)

## 2015-02-05 NOTE — Progress Notes (Signed)
Patient is lethargic, has nausea and complains of bloating and distention.  Unable to take po KCL. Notified Dr. Elisabeth Pigeon via text.

## 2015-02-06 ENCOUNTER — Inpatient Hospital Stay (HOSPITAL_COMMUNITY): Payer: Medicare Other

## 2015-02-06 DIAGNOSIS — E876 Hypokalemia: Secondary | ICD-10-CM | POA: Diagnosis present

## 2015-02-06 DIAGNOSIS — N179 Acute kidney failure, unspecified: Secondary | ICD-10-CM | POA: Diagnosis present

## 2015-02-06 DIAGNOSIS — D638 Anemia in other chronic diseases classified elsewhere: Secondary | ICD-10-CM | POA: Diagnosis present

## 2015-02-06 DIAGNOSIS — E1129 Type 2 diabetes mellitus with other diabetic kidney complication: Secondary | ICD-10-CM | POA: Diagnosis present

## 2015-02-06 LAB — COMPREHENSIVE METABOLIC PANEL
ALK PHOS: 87 U/L (ref 38–126)
ALT: 13 U/L — ABNORMAL LOW (ref 17–63)
ANION GAP: 10 (ref 5–15)
AST: 20 U/L (ref 15–41)
Albumin: 2.2 g/dL — ABNORMAL LOW (ref 3.5–5.0)
BILIRUBIN TOTAL: 1 mg/dL (ref 0.3–1.2)
BUN: 60 mg/dL — ABNORMAL HIGH (ref 6–20)
CALCIUM: 8.5 mg/dL — AB (ref 8.9–10.3)
CO2: 27 mmol/L (ref 22–32)
Chloride: 99 mmol/L — ABNORMAL LOW (ref 101–111)
Creatinine, Ser: 4.63 mg/dL — ABNORMAL HIGH (ref 0.61–1.24)
GFR, EST AFRICAN AMERICAN: 14 mL/min — AB (ref >60)
GFR, EST NON AFRICAN AMERICAN: 12 mL/min — AB (ref >60)
GLUCOSE: 117 mg/dL — AB (ref 65–99)
Potassium: 3.6 mmol/L (ref 3.5–5.1)
Sodium: 136 mmol/L (ref 135–145)
TOTAL PROTEIN: 6.5 g/dL (ref 6.5–8.1)

## 2015-02-06 LAB — CBC WITH DIFFERENTIAL/PLATELET
Basophils Absolute: 0 10*3/uL (ref 0.0–0.1)
Basophils Relative: 0 % (ref 0–1)
EOS PCT: 1 % (ref 0–5)
Eosinophils Absolute: 0.2 10*3/uL (ref 0.0–0.7)
HEMATOCRIT: 23 % — AB (ref 39.0–52.0)
Hemoglobin: 7.5 g/dL — ABNORMAL LOW (ref 13.0–17.0)
LYMPHS PCT: 5 % — AB (ref 12–46)
Lymphs Abs: 0.8 10*3/uL (ref 0.7–4.0)
MCH: 29.4 pg (ref 26.0–34.0)
MCHC: 32.6 g/dL (ref 30.0–36.0)
MCV: 90.2 fL (ref 78.0–100.0)
MONO ABS: 1.2 10*3/uL — AB (ref 0.1–1.0)
MONOS PCT: 8 % (ref 3–12)
NEUTROS ABS: 12.3 10*3/uL — AB (ref 1.7–7.7)
Neutrophils Relative %: 86 % — ABNORMAL HIGH (ref 43–77)
PLATELETS: 372 10*3/uL (ref 150–400)
RBC: 2.55 MIL/uL — ABNORMAL LOW (ref 4.22–5.81)
RDW: 16.6 % — AB (ref 11.5–15.5)
WBC: 14.4 10*3/uL — ABNORMAL HIGH (ref 4.0–10.5)

## 2015-02-06 LAB — HEPARIN LEVEL (UNFRACTIONATED): Heparin Unfractionated: 0.55 IU/mL (ref 0.30–0.70)

## 2015-02-06 LAB — GLUCOSE, CAPILLARY
GLUCOSE-CAPILLARY: 111 mg/dL — AB (ref 65–99)
GLUCOSE-CAPILLARY: 137 mg/dL — AB (ref 65–99)
Glucose-Capillary: 110 mg/dL — ABNORMAL HIGH (ref 65–99)
Glucose-Capillary: 136 mg/dL — ABNORMAL HIGH (ref 65–99)
Glucose-Capillary: 157 mg/dL — ABNORMAL HIGH (ref 65–99)

## 2015-02-06 LAB — PHOSPHORUS: Phosphorus: 5.5 mg/dL — ABNORMAL HIGH (ref 2.5–4.6)

## 2015-02-06 LAB — MAGNESIUM: MAGNESIUM: 2 mg/dL (ref 1.7–2.4)

## 2015-02-06 LAB — SODIUM, URINE, RANDOM: SODIUM UR: 16 mmol/L

## 2015-02-06 LAB — CREATININE, URINE, RANDOM: CREATININE, URINE: 162.25 mg/dL

## 2015-02-06 NOTE — Progress Notes (Signed)
Patient ID: Larry [name], male   DOB: [birth year], 69 y.o.   MRN: [id number] [facility] Surgery Progress Note:   * No surgery found *  Subjective: Mental status is more clear.  No complaints except soreness at drain site.   Objective: Vital signs in last 24 hours: Temp:  [98.4 F (36.9 C)-99.4 F (37.4 C)] 98.6 F (37 C) (08/21 0747) Pulse Rate:  [90-104] 90 (08/21 0400) Resp:  [23-32] 32 (08/21 0400) BP: (100-141)/(48-65) 100/49 mmHg (08/21 0400) SpO2:  [91 %-96 %] 94 % (08/21 0400)  Intake/Output from previous day: 08/20 0701 - 08/21 0700 In: 3007 [P.O.:422; I.V.:308; IV Piggyback:317] Out: 408 [Urine:400; Drains:8] Intake/Output this shift:    Physical Exam: Work of breathing is not labored.  PT didn't work with him yesterday and I'm not sure if he got out of bed.  He needs to be up in chair.    Lab Results:  Results for orders placed or performed during the hospital encounter of 01/19/15 (from the past 48 hour(s))  Glucose, capillary     Status: None   Collection Time: 02/04/15  9:32 AM  Result Value Ref Range   Glucose-Capillary 98 65 - 99 mg/dL   Comment 1 Notify RN    Comment 2 Document in Chart   Glucose, capillary     Status: Abnormal   Collection Time: 02/04/15 12:34 PM  Result Value Ref Range   Glucose-Capillary 158 (H) 65 - 99 mg/dL  Heparin level (unfractionated)     Status: None   Collection Time: 02/04/15  2:05 PM  Result Value Ref Range   Heparin Unfractionated 0.36 0.30 - 0.70 IU/mL    Comment:        IF HEPARIN RESULTS ARE BELOW EXPECTED VALUES, AND PATIENT DOSAGE HAS BEEN CONFIRMED, SUGGEST FOLLOW UP TESTING OF ANTITHROMBIN III LEVELS.   Glucose, capillary     Status: Abnormal   Collection Time: 02/04/15  4:06 PM  Result Value Ref Range   Glucose-Capillary 155 (H) 65 - 99 mg/dL  Glucose, capillary     Status: Abnormal   Collection Time: 02/04/15  7:36 PM  Result Value Ref Range   Glucose-Capillary 214 (H) 65 - 99 mg/dL  Heparin level  (unfractionated)     Status: None   Collection Time: 02/04/15 10:00 PM  Result Value Ref Range   Heparin Unfractionated 0.56 0.30 - 0.70 IU/mL    Comment:        IF HEPARIN RESULTS ARE BELOW EXPECTED VALUES, AND PATIENT DOSAGE HAS BEEN CONFIRMED, SUGGEST FOLLOW UP TESTING OF ANTITHROMBIN III LEVELS.   Glucose, capillary     Status: Abnormal   Collection Time: 02/05/15 12:29 AM  Result Value Ref Range   Glucose-Capillary 153 (H) 65 - 99 mg/dL  Glucose, capillary     Status: Abnormal   Collection Time: 02/05/15  4:48 AM  Result Value Ref Range   Glucose-Capillary 114 (H) 65 - 99 mg/dL  Heparin level (unfractionated)     Status: None   Collection Time: 02/05/15  6:00 AM  Result Value Ref Range   Heparin Unfractionated 0.52 0.30 - 0.70 IU/mL    Comment:        IF HEPARIN RESULTS ARE BELOW EXPECTED VALUES, AND PATIENT DOSAGE HAS BEEN CONFIRMED, SUGGEST FOLLOW UP TESTING OF ANTITHROMBIN III LEVELS.   Magnesium     Status: Abnormal   Collection Time: 02/05/15  6:30 AM  Result Value Ref Range   Magnesium 1.5 (L) 1.7 - 2.4 mg/dL  Phosphorus     Status: Abnormal   Collection Time: 02/05/15  6:30 AM  Result Value Ref Range   Phosphorus 4.9 (H) 2.5 - 4.6 mg/dL  CBC with Differential/Platelet     Status: Abnormal   Collection Time: 02/05/15  6:30 AM  Result Value Ref Range   WBC 13.3 (H) 4.0 - 10.5 K/uL   RBC 2.48 (L) 4.22 - 5.81 MIL/uL   Hemoglobin 7.3 (L) 13.0 - 17.0 g/dL   HCT 22.4 (L) 39.0 - 52.0 %   MCV 90.3 78.0 - 100.0 fL   MCH 29.4 26.0 - 34.0 pg   MCHC 32.6 30.0 - 36.0 g/dL   RDW 16.5 (H) 11.5 - 15.5 %   Platelets 369 150 - 400 K/uL   Neutrophils Relative % 86 (H) 43 - 77 %   Neutro Abs 11.5 (H) 1.7 - 7.7 K/uL   Lymphocytes Relative 4 (L) 12 - 46 %   Lymphs Abs 0.6 (L) 0.7 - 4.0 K/uL   Monocytes Relative 9 3 - 12 %   Monocytes Absolute 1.1 (H) 0.1 - 1.0 K/uL   Eosinophils Relative 1 0 - 5 %   Eosinophils Absolute 0.1 0.0 - 0.7 K/uL   Basophils Relative 0 0 - 1 %    Basophils Absolute 0.0 0.0 - 0.1 K/uL  Basic metabolic panel     Status: Abnormal   Collection Time: 02/05/15  6:30 AM  Result Value Ref Range   Sodium 136 135 - 145 mmol/L   Potassium 3.3 (L) 3.5 - 5.1 mmol/L   Chloride 99 (L) 101 - 111 mmol/L   CO2 29 22 - 32 mmol/L   Glucose, Bld 115 (H) 65 - 99 mg/dL   BUN 48 (H) 6 - 20 mg/dL   Creatinine, Ser 3.95 (H) 0.61 - 1.24 mg/dL   Calcium 8.4 (L) 8.9 - 10.3 mg/dL   GFR calc non Af Amer 14 (L) >60 mL/min   GFR calc Af Amer 17 (L) >60 mL/min    Comment: (NOTE) The eGFR has been calculated using the CKD EPI equation. This calculation has not been validated in all clinical situations. eGFR's persistently <60 mL/min signify possible Chronic Kidney Disease.    Anion gap 8 5 - 15  Glucose, capillary     Status: None   Collection Time: 02/05/15  8:00 AM  Result Value Ref Range   Glucose-Capillary 92 65 - 99 mg/dL   Comment 1 Notify RN    Comment 2 Document in Chart   Glucose, capillary     Status: Abnormal   Collection Time: 02/05/15 12:41 PM  Result Value Ref Range   Glucose-Capillary 173 (H) 65 - 99 mg/dL   Comment 1 Notify RN    Comment 2 Document in Chart   Urinalysis, Routine w reflex microscopic (not at [facility])     Status: Abnormal   Collection Time: 02/05/15  1:14 PM  Result Value Ref Range   Color, Urine YELLOW YELLOW   APPearance TURBID (A) CLEAR   Specific Gravity, Urine 1.017 1.005 - 1.030   pH 5.0 5.0 - 8.0   Glucose, UA NEGATIVE NEGATIVE mg/dL   Hgb urine dipstick SMALL (A) NEGATIVE   Bilirubin Urine NEGATIVE NEGATIVE   Ketones, ur NEGATIVE NEGATIVE mg/dL   Protein, ur NEGATIVE NEGATIVE mg/dL   Urobilinogen, UA 0.2 0.0 - 1.0 mg/dL   Nitrite NEGATIVE NEGATIVE   Leukocytes, UA MODERATE (A) NEGATIVE  Urine microscopic-add on     Status: Abnormal   Collection  Time: 02/05/15  1:14 PM  Result Value Ref Range   WBC, UA 21-50 <3 WBC/hpf   RBC / HPF 3-6 <3 RBC/hpf   Bacteria, UA MANY (A) RARE   Urine-Other MUCOUS PRESENT    Glucose, capillary     Status: Abnormal   Collection Time: 02/05/15  3:49 PM  Result Value Ref Range   Glucose-Capillary 161 (H) 65 - 99 mg/dL  Glucose, capillary     Status: Abnormal   Collection Time: 02/05/15  8:58 PM  Result Value Ref Range   Glucose-Capillary 157 (H) 65 - 99 mg/dL  Glucose, capillary     Status: Abnormal   Collection Time: 02/06/15 12:45 AM  Result Value Ref Range   Glucose-Capillary 136 (H) 65 - 99 mg/dL  Glucose, capillary     Status: Abnormal   Collection Time: 02/06/15  3:11 AM  Result Value Ref Range   Glucose-Capillary 111 (H) 65 - 99 mg/dL  Magnesium     Status: None   Collection Time: 02/06/15  4:25 AM  Result Value Ref Range   Magnesium 2.0 1.7 - 2.4 mg/dL  Phosphorus     Status: Abnormal   Collection Time: 02/06/15  4:25 AM  Result Value Ref Range   Phosphorus 5.5 (H) 2.5 - 4.6 mg/dL  CBC with Differential/Platelet     Status: Abnormal   Collection Time: 02/06/15  4:25 AM  Result Value Ref Range   WBC 14.4 (H) 4.0 - 10.5 K/uL   RBC 2.55 (L) 4.22 - 5.81 MIL/uL   Hemoglobin 7.5 (L) 13.0 - 17.0 g/dL   HCT 23.0 (L) 39.0 - 52.0 %   MCV 90.2 78.0 - 100.0 fL   MCH 29.4 26.0 - 34.0 pg   MCHC 32.6 30.0 - 36.0 g/dL   RDW 16.6 (H) 11.5 - 15.5 %   Platelets 372 150 - 400 K/uL   Neutrophils Relative % 86 (H) 43 - 77 %   Neutro Abs 12.3 (H) 1.7 - 7.7 K/uL   Lymphocytes Relative 5 (L) 12 - 46 %   Lymphs Abs 0.8 0.7 - 4.0 K/uL   Monocytes Relative 8 3 - 12 %   Monocytes Absolute 1.2 (H) 0.1 - 1.0 K/uL   Eosinophils Relative 1 0 - 5 %   Eosinophils Absolute 0.2 0.0 - 0.7 K/uL   Basophils Relative 0 0 - 1 %   Basophils Absolute 0.0 0.0 - 0.1 K/uL  Heparin level (unfractionated)     Status: None   Collection Time: 02/06/15  4:25 AM  Result Value Ref Range   Heparin Unfractionated 0.55 0.30 - 0.70 IU/mL    Comment:        IF HEPARIN RESULTS ARE BELOW EXPECTED VALUES, AND PATIENT DOSAGE HAS BEEN CONFIRMED, SUGGEST FOLLOW UP TESTING OF ANTITHROMBIN  III LEVELS.   Comprehensive metabolic panel     Status: Abnormal   Collection Time: 02/06/15  4:25 AM  Result Value Ref Range   Sodium 136 135 - 145 mmol/L   Potassium 3.6 3.5 - 5.1 mmol/L   Chloride 99 (L) 101 - 111 mmol/L   CO2 27 22 - 32 mmol/L   Glucose, Bld 117 (H) 65 - 99 mg/dL   BUN 60 (H) 6 - 20 mg/dL   Creatinine, Ser 4.63 (H) 0.61 - 1.24 mg/dL   Calcium 8.5 (L) 8.9 - 10.3 mg/dL   Total Protein 6.5 6.5 - 8.1 g/dL   Albumin 2.2 (L) 3.5 - 5.0 g/dL   AST 20 15 -  41 U/L   ALT 13 (L) 17 - 63 U/L   Alkaline Phosphatase 87 38 - 126 U/L   Total Bilirubin 1.0 0.3 - 1.2 mg/dL   GFR calc non Af Amer 12 (L) >60 mL/min   GFR calc Af Amer 14 (L) >60 mL/min    Comment: (NOTE) The eGFR has been calculated using the CKD EPI equation. This calculation has not been validated in all clinical situations. eGFR's persistently <60 mL/min signify possible Chronic Kidney Disease.    Anion gap 10 5 - 15  Glucose, capillary     Status: Abnormal   Collection Time: 02/06/15  7:45 AM  Result Value Ref Range   Glucose-Capillary 110 (H) 65 - 99 mg/dL   Comment 1 Notify RN     Radiology/Results: Dg Chest Port 1 View  02/05/2015   CLINICAL DATA:  Recent nausea and vomiting and shortness of Breath  EXAM: PORTABLE CHEST - 1 VIEW  COMPARISON:  01/30/2015  FINDINGS: Cardiac shadow is enlarged but stable. A right jugular line is again seen and stable. Elevation the right hemidiaphragm is again noted. A right upper quadrant drainage catheter is again noted. No focal infiltrate or sizable effusion is seen.  IMPRESSION: No acute abnormality noted.   Electronically Signed   By: Inez Catalina M.D.   On: 02/05/2015 11:52    Anti-infectives: Anti-infectives    Start     Dose/Rate Route Frequency Ordered Stop   02/04/15 1200  piperacillin-tazobactam (ZOSYN) IVPB 2.25 g  Status:  Discontinued     2.25 g 100 mL/hr over 30 Minutes Intravenous 4 times per day 02/04/15 0859 02/04/15 0905   02/04/15 1200   piperacillin-tazobactam (ZOSYN) IVPB 3.375 g     3.375 g 12.5 mL/hr over 240 Minutes Intravenous Every 8 hours 02/04/15 0905     02/02/15 2000  fluconazole (DIFLUCAN) tablet 100 mg     100 mg Oral Every 24 hours 02/02/15 1908 02/09/15 1959   02/02/15 1800  fluconazole (DIFLUCAN) tablet 100 mg  Status:  Discontinued     100 mg Oral Daily 02/02/15 1545 02/02/15 1901   01/31/15 1000  fluconazole (DIFLUCAN) tablet 100 mg  Status:  Discontinued     100 mg Oral Daily 01/31/15 0727 02/02/15 1313   01/29/15 1700  piperacillin-tazobactam (ZOSYN) IVPB 2.25 g  Status:  Discontinued     2.25 g 100 mL/hr over 30 Minutes Intravenous Every 8 hours 01/29/15 0845 02/04/15 0859   01/24/15 2200  vancomycin (VANCOCIN) IVPB 1000 mg/200 mL premix     1,000 mg 200 mL/hr over 60 Minutes Intravenous  Once 01/24/15 1349 01/24/15 2301   01/21/15 2000  vancomycin (VANCOCIN) 1,500 mg in sodium chloride 0.9 % 500 mL IVPB  Status:  Discontinued     1,500 mg 250 mL/hr over 120 Minutes Intravenous Every 24 hours 01/21/15 1048 01/23/15 1726   01/20/15 2000  vancomycin (VANCOCIN) 1,250 mg in sodium chloride 0.9 % 250 mL IVPB  Status:  Discontinued     1,250 mg 166.7 mL/hr over 90 Minutes Intravenous Every 24 hours 01/19/15 1945 01/20/15 0843   01/20/15 2000  vancomycin (VANCOCIN) 1,250 mg in sodium chloride 0.9 % 250 mL IVPB  Status:  Discontinued     1,250 mg 166.7 mL/hr over 90 Minutes Intravenous Every 24 hours 01/20/15 1519 01/21/15 1048   01/20/15 0000  piperacillin-tazobactam (ZOSYN) IVPB 3.375 g  Status:  Discontinued     3.375 g 12.5 mL/hr over 240 Minutes Intravenous Every 8 hours 01/19/15  1946 01/29/15 0845   01/19/15 1715  vancomycin (VANCOCIN) 2,500 mg in sodium chloride 0.9 % 500 mL IVPB     2,500 mg 250 mL/hr over 120 Minutes Intravenous  Once 01/19/15 1707 01/19/15 2159   01/19/15 1715  piperacillin-tazobactam (ZOSYN) IVPB 3.375 g     3.375 g 100 mL/hr over 30 Minutes Intravenous  Once 01/19/15 1707  01/19/15 1900      Assessment/Plan: Problem List: Patient Active Problem List   Diagnosis Date Noted  . Acute respiratory failure with hypoxia   . Infected hematoma following procedure   . Leukocytosis   . Protein-calorie malnutrition   . Hematoma   . Candidiasis of urogenital site   . Abdominal abscess   . Intra-abdominal infection   . Acute on chronic kidney failure 01/31/2015  . Acute blood loss anemia 01/31/2015  . Catheter-associated urinary tract infection 01/31/2015  . Intra-abdominal hematoma   . Acute respiratory distress   . Sepsis   . Intra-abdominal abscess 01/19/2015  . Paroxysmal a-fib 01/19/2015  . Shock circulatory 01/19/2015  . Obesity 01/06/2015  . Anticoagulated 01/06/2015  . Chronic cholecystitis 01/04/2015  . Preoperative clearance 12/16/2014  . Paroxysmal atrial fibrillation   . CAD (coronary artery disease) 10/06/2014  . Acute cholecystitis   . Dyspnea   . Septic shock   . Cholecystitis 10/05/2014  . Diabetes mellitus without complication 97/07/6376  . HTN (hypertension) 10/05/2014  . Hypotension 10/05/2014    Slow improvement.   * No surgery found *    LOS: 18 days   Matt B. Hassell Done, MD, [facility] [phone] beeper [phone]  02/06/2015 8:42 AM

## 2015-02-06 NOTE — Progress Notes (Signed)
Calorie Count Note  48 hour calorie count ordered. Day 2 results below.  Diet: Soft diet Supplements:  -Boost Breeze po TID, each supplement provides 250 kcal and 9 grams of protein -Magic cup BID with meals, each supplement provides 290 kcal and 9 grams of protein  8/20: Breakfast: 176 kcal, 10g protein Lunch: 0% Dinner: 46 kcal, 4g protein Supplements: 217 kcal, 14g protein  Estimated Nutritional Needs:   Kcal: 1610-9604  Protein: 95-105 grams  Fluid: 1.7-2.2 L/day  Total intake: 439 kcal (25% of minimum estimated needs)  28g protein (29% of minimum estimated needs)  Nutrition Dx: Inadequate oral intake related to lethargy/confusion, poor appetite as evidenced by meal completion < 50%.  Goal: Pt to meet >/= 90% of their estimated nutrition needs   Intervention:  -Continue Boost Breeze po TID, each supplement provides 250 kcal and 9 grams of protein -Continue Magic cup BID with meals, each supplement provides 290 kcal and 9 grams of protein -continue Calorie Count for another 24 hours -Encourage PO intake   Tilda Franco, MS, RD, LDN Pager: [phone] After Hours Pager: [phone]
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
## 2015-02-06 NOTE — Progress Notes (Signed)
Subjective: Interval History: has complaints still SOB.  Objective: Vital signs in last 24 hours: Temp:  [98.4 F (36.9 C)-99.4 F (37.4 C)] 98.6 F (37 C) (08/21 0747) Pulse Rate:  [90-104] 90 (08/21 0400) Resp:  [23-32] 32 (08/21 0400) BP: (100-141)/(48-65) 100/49 mmHg (08/21 0400) SpO2:  [91 %-96 %] 94 % (08/21 0400) Weight change:   Intake/Output from previous day: 08/20 0701 - 08/21 0700 In: 1057 [P.O.:422; I.V.:308; IV Piggyback:317] Out: 408 [Urine:400; Drains:8] Intake/Output this shift:    General appearance: cooperative, mild distress, morbidly obese, pale and SOB Resp: diminished breath sounds bilaterally and rales bibasilar Cardio: S1, S2 normal and systolic murmur: holosystolic 2/6, blowing at apex GI: obese,pos bs,soft, pain on palp RUQ, drain RUQ Extremities: edema 2+  Lab Results:  Recent Labs  02/05/15 0630 02/06/15 0425  WBC 13.3* 14.4*  HGB 7.3* 7.5*  HCT 22.4* 23.0*  PLT 369 372   BMET:  Recent Labs  02/05/15 0630 02/06/15 0425  NA 136 136  K 3.3* 3.6  CL 99* 99*  CO2 29 27  GLUCOSE 115* 117*  BUN 48* 60*  CREATININE 3.95* 4.63*  CALCIUM 8.4* 8.5*   No results for input(s): PTH in the last 72 hours. Iron Studies: No results for input(s): IRON, TIBC, TRANSFERRIN, FERRITIN in the last 72 hours.  Studies/Results: Dg Chest Port 1 View  02/05/2015   CLINICAL DATA:  Recent nausea and vomiting and shortness of Breath  EXAM: PORTABLE CHEST - 1 VIEW  COMPARISON:  01/30/2015  FINDINGS: Cardiac shadow is enlarged but stable. A right jugular line is again seen and stable. Elevation the right hemidiaphragm is again noted. A right upper quadrant drainage catheter is again noted. No focal infiltrate or sizable effusion is seen.  IMPRESSION: No acute abnormality noted.   Electronically Signed   By: Alcide Clever M.D.   On: 02/05/2015 11:52    I have reviewed the patient's current medications.  Assessment/Plan: 1 AKI worsened. Acid base/K ok.  Concern  Mg will rise with lower GFR so stop.  Suspect hemdynamic but will consider other causes such as obstruction.  Still vol xs. 2 Sepsis/Abscess drain 3 DM controlled 4 Nutrition suppl 5 Anemia stable 6 Resp still an issue 7 PAF P U/s, U Na/Cr, limit vol    LOS: 18 days   Larry [name] 02/06/2015,10:42 AM

## 2015-02-06 NOTE — Progress Notes (Addendum)
Patient ID: Larry [name], male   DOB: [birth year], 69 y.o.   MRN: [id number] [facility] PROGRESS NOTE  MANSOUR BALBOA WUJ:[id number] DOB: 1946/05/10 DOA: 01/19/2015 PCP: [name]  Brief narrative:    69 year old male with past medical history of atrial fibrillation (on anticoagulation with Eliquis), hypertension, dyslipidemia, diabetes. Patient underwent laparoscopic cholecystectomy on 01/03/2015. He subsequently developed infected gallbladder fossa hematoma and has underwent drain placement (cultures from the fluid analysis yielded mixed organisms). Patient presented to [facility] 01/19/2015 with septic shock and was then transferred to [facility] for management of infected hematoma. He has required pressor support on admission but has been off pressors since 01/21/2015.   Patient has required a total of 2 units of PRBC transfusion because of increasing intraperitoneal hemorrhage on 01/23/2015. Additionally, patient was found to have yeast urinary tract infection and acute renal failure. Nephrology and infectious disease are seeing the patient in consultation. [facility] consulted for diabetes management as well as other medical issues.  Patient's hospital course is further complicated with ongoing pain at the drain site. Repeat CT abdomen done 02/03/15 demonstrated 9.8 cm hematoma with gas in the gallbladder fossa with indwelling pigtail drainage catheter, mildly decreased, 12.6 cm hematoma along the posterior right liver, mildly increased, moderate abdominopelvic ascites. His drain was changed to larger size by IR 02/04/2015.   Assessment/Plan:    Principal Problem: Septic shock secondary to complicated cholecystitis status post cholecystectomy 01/03/2015 with infected hematoma / Leukocytosis - Septic shock present on admission thought to be secondary to infected gallbladder fossa hematoma status post recent cholecystectomy and drain placement  - CT repeated 8/18 and demonstrated 9.8 cm hematoma with gas in  the gallbladder fossa with indwelling pigtail drainage catheter, mildly decreased, 12.6 cm hematoma along the posterior right liver, mildly increased, moderate abdominopelvic ascites. - IR changed to drain to larger on 02/04/2015 - Patient is on Zosyn every 8 hours per infectious disease recommendations - Patient is hemodynamically stable. He has been off pressors since 01/21/2015.   Active Problems: Yeast UTI / catheter related urinary tract infection  - Continue fluconazole per infectious disease recommendations  Acute hypoxic respiratory failure secondary to volume overload and anasarca - Likely from initial fluid resuscitation for septic shock - Chest x-ray done 02/05/2015 show no acute abnormalities. - Patient was on IV Lasix 80 mg IV every 8 hours which was transitioned to oral Lasix 02/04/2015. Because of worsening renal function Lasix stopped 02/05/2015. - Weight over past 72 hours: 127.4 kg --> 122.3 kg --> 119.4 kg - Continue daily weight and strict intake and output  Acute renal failure - Likely from ATN associated with sepsis during the initial phase of hospitalization.  - Creatinine initially improving and at lowest 3.63 on 02/04/2015. Renal function however worsening over past 48 hours and current creatinine is 4.63. - Nephrology has stopped Lasix 02/05/2015. Appreciate renal recommendations.  Hypokalemia/hypomagnesemia - Secondary to lasix - Supplemented   Anemia of chronic disease / Acute blood loss anemia - Due to anticoagulation and hematoma - S/P 2 units of PRBC (on 01/23/2015) - Aranesp given every Wednesday (last dose 8/17) - Feraheme IV given Q 72 hours (last given 8/17) - Hemoglobin is 7.5 this morning.  History of atrial fibrillation - CHADS vasc score at least 3 - On eliquis at home but now on IV heparin - Rate controlled   Diabetes mellitus type 2, uncontrolled - A1c 8.2 indicating poor glycemic control - Continue Lantus 35 units at bedtime and  SSI  Morbid obesity - Body mass index is 45.35 kg/(m^2). - Nutrition consulted  - Continue nutritional supplementation   DVT Prophylaxis  - IV heparin    Code Status: Full.  Family Communication:  plan of care discussed with the patient' wife at the bedside Disposition Plan: Per primary team   IV access:  Peripheral IV  Procedures and diagnostic studies:     CT's Ct Abdomen Pelvis Wo Contrast 02/03/2015   9.8 cm hematoma with gas in the gallbladder fossa, with indwelling pigtail drainage catheter, mildly decreased.  12.6 cm hematoma along the posterior right liver, mildly increased.  Moderate abdominopelvic ascites, stable versus mildly increased.  Mild wall thickening involving the right colon, possibly secondary to ascites and right upper quadrant inflammation.   Electronically Signed   By: Charline Bills M.D.   On: 02/03/2015 11:22   Ct Abdomen Pelvis Wo Contrast 01/28/2015  The gallbladder fossa hematoma/abscess has not significantly changed in size. Stable position of the percutaneous drainage catheter.  Stable appearance of the large perihepatic or subcapsular liver hematoma. Additional hematomas along the right anterior abdomen are stable. No significant change in the amount of ascites. Again noted is a small amount of perisplenic fluid.  Bilateral pleural effusions, right side greater the left. Stable volume loss and consolidation in the right lower lobe.   Electronically Signed   By: Richarda Overlie M.D.   On: 01/28/2015 07:23   Ct Abdomen Pelvis Wo Contrast 01/22/2015 Post percutaneous drainage 's of a gallbladder fossa abscess collection, collection now larger and higher in attenuation than on the previous study question interval hemorrhage.  Additionally, increased flow void with high density layering noted perihepatic compatible with increased intraperitoneal hemorrhage.  Increased ascites.  Mild increase in RIGHT pleural effusion and basilar atelectasis.  Findings called to Dr.  Gerrit Friends on 01/22/2015 at 1704 hr.   Electronically Signed   By: Ulyses Southward M.D.   On: 01/22/2015 17:06   Ct Abdomen Pelvis W Contrast 01/13/2015 1. Gas and fluid at the cholecystectomy site following Reese removal of the drainage catheter is likely within normal limits. No discrete abscess or organized fluid collection is present. Follow-up CT scan in 1-2 weeks or nuclear medicine hepatobiliary scan could be used for further evaluation if the patient's symptoms persist 2. Minimal ascites about the liver is likely reactive. No significant focal attenuation differences are evident adjacent to the cholecystectomy site. 3. Extensive atherosclerotic changes including coronary artery disease. 4. Small right pleural effusion and associated atelectasis. 5. Degenerative change within the thoracolumbar spine as described.     CXR's Dg Chest Port 1 View 02/05/2015   No acute abnormality noted.    Dg Chest Port 1 View 01/30/2015  Right IJ central line with tip in right atrium again noted. No pulmonary edema. There is chronic elevation of the right hemidiaphragm with right basilar atelectasis.  Dg Chest Port 1 View 01/21/2015   Persistent RIGHT basilar atelectasis.     Dg Chest Port 1 View 01/20/2015   1. New right IJ central line with tip at the right atrial level. No pneumothorax. 2. Low lung volumes with bibasilar atelectasis or pneumonia.    Dg Chest Port 1 View 01/19/2015    Mild opacification in the right perihilar region and medial right base which may be due to atelectasis or infection.      Procedures: Ct Image Guided Drainage Percut Cath  Peritoneal Retroperit 01/20/2015  Successful CT-guided gallbladder fossa abscess drain insertion.    Medical Consultants:  Surgery Nephrology Infectious disease Interventional radiology   Other Consultants:  Diabetic coordinator Nutrition  PT/OT  IAnti-Infectives:   Diflucan Yetta Barre, MD  [facility] Pager [phone]  Time spent in  minutes: 25 minutes  If 7PM-7AM, please contact night-coverage [email or web address] Password [facility] 02/06/2015, 8:43 AM   LOS: 18 days    HPI/Subjective: No acute overnight events. Patient sleeping this am. Family at the bedside reports that patient is still complaining of pain when awake.  Objective: Filed Vitals:   02/06/15 0000 02/06/15 0317 02/06/15 0400 02/06/15 0747  BP: 102/55  100/49   Pulse: 96  90   Temp:  99.4 F (37.4 C)  98.6 F (37 C)  TempSrc:  Axillary  Oral  Resp: 28  32   Height:      Weight:      SpO2: 94%  94%     Intake/Output Summary (Last 24 hours) at 02/06/15 0843 Last data filed at 02/06/15 4540  Gross per 24 hour  Intake   1023 ml  Output    408 ml  Net    615 ml    Exam:   General:  Pt is sleeping this morning, no acute distress  Cardiovascular: Slightly tachycardic, appreciate S1, S2  Respiratory: Bilateral air entry, no wheezing, no rhonchi  Abdomen: drain in place on the right side of abdomen, (+) BS, nontender abdomen  Extremities: +1-2 LE pitting edema, appreciate pulses bilaterally  Neuro: No focal neurological deficits  Data Reviewed: Basic Metabolic Panel:  Recent Labs Lab 02/02/15 0352 02/03/15 0359 02/04/15 0240 02/05/15 0630 02/06/15 0425  NA 136 137 136 136 136  K 3.4* 3.6 3.5 3.3* 3.6  CL 101 101 99* 99* 99*  CO2 27 27 28 29 27   GLUCOSE 187* 136* 147* 115* 117*  BUN 50* 49* 50* 48* 60*  CREATININE 3.94* 3.79* 3.63* 3.95* 4.63*  CALCIUM 8.5* 8.8* 8.6* 8.4* 8.5*  MG 1.6* 1.6* 1.5* 1.5* 2.0  PHOS 3.7 3.6 3.8 4.9* 5.5*   Liver Function Tests:  Recent Labs Lab 02/06/15 0425  AST 20  ALT 13*  ALKPHOS 87  BILITOT 1.0  PROT 6.5  ALBUMIN 2.2*   No results for input(s): LIPASE, AMYLASE in the last 168 hours. No results for input(s): AMMONIA in the last 168 hours. CBC:  Recent Labs Lab 02/02/15 0352 02/03/15 0359 02/04/15 0240 02/05/15 0630 02/06/15 0425  WBC 13.4* 13.3* 14.5* 13.3* 14.4*  NEUTROABS  11.3* 11.1* 12.7* 11.5* 12.3*  HGB 7.6* 8.1* 7.8* 7.3* 7.5*  HCT 24.1* 24.9* 23.9* 22.4* 23.0*  MCV 89.3 88.9 89.2 90.3 90.2  PLT 378 398 360 369 372   Cardiac Enzymes: No results for input(s): CKTOTAL, CKMB, CKMBINDEX, TROPONINI in the last 168 hours. BNP: Invalid input(s): POCBNP CBG:  Recent Labs Lab 02/05/15 1549 02/05/15 2058 02/06/15 0045 02/06/15 0311 02/06/15 0745  GLUCAP 161* 157* 136* 111* 110*   Results for orders placed or performed during the hospital encounter of 01/19/15  Culture, routine-abscess     Status: None   Collection Time: 01/20/15  1:00 PM  Result Value Ref Range Status   Specimen Description ABSCESS GALL BLADDER FOSSA  Final   Special Requests Normal  Final   Gram Stain   Final   Culture   Final    MULTIPLE ORGANISMS PRESENT, NONE PREDOMINANT Note: NO STAPHYLOCOCCUS AUREUS ISOLATED NO GROUP A STREP (S.PYOGENES) ISOLATED Performed at [facility]    Report Status 01/23/2015 FINAL  Final  Anaerobic culture     Status: None   Collection Time: 01/20/15  1:00 PM  Result Value Ref Range Status   Specimen Description ABSCESS GALL BLADDER FOSSA  Final   Special Requests Normal  Final   Gram Stain   Final   Culture   Final    NO ANAEROBES ISOLATED Performed at [facility]    Report Status 01/26/2015 FINAL  Final  MRSA PCR Screening     Status: None   Collection Time: 01/20/15  1:39 PM  Result Value Ref Range Status   MRSA by PCR NEGATIVE NEGATIVE Final  Culture, blood (routine x 2)     Status: None   Collection Time: 01/20/15  1:50 PM  Result Value Ref Range Status   Specimen Description BLOOD RIGHT ARM  Final   Special Requests IN PEDIATRIC BOTTLE 4CC  Final   Culture   Final    NO GROWTH 5 DAYS Performed at [facility]    Report Status 01/25/2015 FINAL  Final  Culture, blood (routine x 2)     Status: None   Collection Time: 01/20/15  1:55 PM  Result Value Ref Range Status   Specimen Description BLOOD RIGHT HAND   Final   Special Requests IN PEDIATRIC BOTTLE 4CC  Final   Culture   Final    NO GROWTH 5 DAYS Performed at [facility]    Report Status 01/25/2015 FINAL  Final  Urine culture     Status: None   Collection Time: 01/30/15 11:40 AM  Result Value Ref Range Status   Specimen Description URINE, RANDOM  Final   Special Requests NONE  Final   Culture   Final    >=100,000 COLONIES/mL YEAST Performed at [facility]    Report Status 02/01/2015 FINAL  Final     . antiseptic oral rinse  7 mL Mouth Rinse BID  . darbepoetin (ARANESP) injection - NON-DIALYSIS  200 mcg Subcutaneous Q Wed-1800  . diphenhydrAMINE  12.5 mg Intravenous Once  . feeding supplement  1 Container Oral TID BM  . fluconazole  100 mg Oral Q24H  . insulin aspart  0-15 Units Subcutaneous 6 times per day  . insulin glargine  35 Units Subcutaneous QHS  . magnesium oxide  400 mg Oral BID  . megestrol  40 mg Oral Daily  . nystatin   Topical TID  . ondansetron  8 mg Oral BID  . piperacillin-tazobactam (ZOSYN)  IV  3.375 g Intravenous Q8H  . potassium chloride  40 mEq Oral Once     Continuous Infusions: . sodium chloride 10 mL/hr at 01/31/15 0508  . heparin 2,400 Units/hr (02/06/15 0313)

## 2015-02-07 ENCOUNTER — Inpatient Hospital Stay (HOSPITAL_COMMUNITY): Payer: Medicare Other

## 2015-02-07 LAB — CBC WITH DIFFERENTIAL/PLATELET
BASOS ABS: 0 10*3/uL (ref 0.0–0.1)
BASOS PCT: 0 % (ref 0–1)
EOS ABS: 0.2 10*3/uL (ref 0.0–0.7)
Eosinophils Relative: 2 % (ref 0–5)
HCT: 23.7 % — ABNORMAL LOW (ref 39.0–52.0)
HEMOGLOBIN: 7.6 g/dL — AB (ref 13.0–17.0)
Lymphocytes Relative: 7 % — ABNORMAL LOW (ref 12–46)
Lymphs Abs: 0.9 10*3/uL (ref 0.7–4.0)
MCH: 29.2 pg (ref 26.0–34.0)
MCHC: 32.1 g/dL (ref 30.0–36.0)
MCV: 91.2 fL (ref 78.0–100.0)
MONO ABS: 1.1 10*3/uL — AB (ref 0.1–1.0)
MONOS PCT: 7 % (ref 3–12)
NEUTROS ABS: 12.1 10*3/uL — AB (ref 1.7–7.7)
NEUTROS PCT: 84 % — AB (ref 43–77)
Platelets: 390 10*3/uL (ref 150–400)
RBC: 2.6 MIL/uL — ABNORMAL LOW (ref 4.22–5.81)
RDW: 17 % — AB (ref 11.5–15.5)
WBC: 14.3 10*3/uL — ABNORMAL HIGH (ref 4.0–10.5)

## 2015-02-07 LAB — COMPREHENSIVE METABOLIC PANEL
ALBUMIN: 2.4 g/dL — AB (ref 3.5–5.0)
ALT: 11 U/L — ABNORMAL LOW (ref 17–63)
ANION GAP: 12 (ref 5–15)
AST: 20 U/L (ref 15–41)
Alkaline Phosphatase: 99 U/L (ref 38–126)
BUN: 60 mg/dL — ABNORMAL HIGH (ref 6–20)
CHLORIDE: 98 mmol/L — AB (ref 101–111)
CO2: 24 mmol/L (ref 22–32)
Calcium: 8.6 mg/dL — ABNORMAL LOW (ref 8.9–10.3)
Creatinine, Ser: 5.25 mg/dL — ABNORMAL HIGH (ref 0.61–1.24)
GFR calc non Af Amer: 10 mL/min — ABNORMAL LOW (ref >60)
GFR, EST AFRICAN AMERICAN: 12 mL/min — AB (ref >60)
GLUCOSE: 79 mg/dL (ref 65–99)
Potassium: 3.9 mmol/L (ref 3.5–5.1)
SODIUM: 134 mmol/L — AB (ref 135–145)
Total Bilirubin: 1.4 mg/dL — ABNORMAL HIGH (ref 0.3–1.2)
Total Protein: 6.5 g/dL (ref 6.5–8.1)

## 2015-02-07 LAB — RENAL FUNCTION PANEL
Albumin: 2 g/dL — ABNORMAL LOW (ref 3.5–5.0)
Anion gap: 11 (ref 5–15)
BUN: 65 mg/dL — AB (ref 6–20)
CHLORIDE: 96 mmol/L — AB (ref 101–111)
CO2: 26 mmol/L (ref 22–32)
Calcium: 8.4 mg/dL — ABNORMAL LOW (ref 8.9–10.3)
Creatinine, Ser: 5.83 mg/dL — ABNORMAL HIGH (ref 0.61–1.24)
GFR calc Af Amer: 10 mL/min — ABNORMAL LOW (ref >60)
GFR, EST NON AFRICAN AMERICAN: 9 mL/min — AB (ref >60)
GLUCOSE: 133 mg/dL — AB (ref 65–99)
POTASSIUM: 4.2 mmol/L (ref 3.5–5.1)
Phosphorus: 5.7 mg/dL — ABNORMAL HIGH (ref 2.5–4.6)
Sodium: 133 mmol/L — ABNORMAL LOW (ref 135–145)

## 2015-02-07 LAB — GLUCOSE, CAPILLARY
GLUCOSE-CAPILLARY: 117 mg/dL — AB (ref 65–99)
GLUCOSE-CAPILLARY: 118 mg/dL — AB (ref 65–99)
GLUCOSE-CAPILLARY: 119 mg/dL — AB (ref 65–99)
GLUCOSE-CAPILLARY: 130 mg/dL — AB (ref 65–99)
GLUCOSE-CAPILLARY: 216 mg/dL — AB (ref 65–99)
GLUCOSE-CAPILLARY: 82 mg/dL (ref 65–99)
Glucose-Capillary: 67 mg/dL (ref 65–99)
Glucose-Capillary: 68 mg/dL (ref 65–99)
Glucose-Capillary: 74 mg/dL (ref 65–99)
Glucose-Capillary: 146 mg/dL — ABNORMAL HIGH (ref 65–99)
Glucose-Capillary: 191 mg/dL — ABNORMAL HIGH (ref 65–99)

## 2015-02-07 LAB — HEPARIN LEVEL (UNFRACTIONATED): HEPARIN UNFRACTIONATED: 0.32 [IU]/mL (ref 0.30–0.70)

## 2015-02-07 LAB — MAGNESIUM: MAGNESIUM: 2.1 mg/dL (ref 1.7–2.4)

## 2015-02-07 LAB — PHOSPHORUS: PHOSPHORUS: 5.5 mg/dL — AB (ref 2.5–4.6)

## 2015-02-07 LAB — PREALBUMIN: Prealbumin: 5.3 mg/dL — ABNORMAL LOW (ref 18–38)

## 2015-02-07 MED ORDER — HEPARIN SODIUM (PORCINE) 1000 UNIT/ML DIALYSIS
1000.0000 [IU] | INTRAMUSCULAR | Status: DC | PRN
  Administered 2015-02-11: 1000 [IU] via INTRAVENOUS_CENTRAL
  Administered 2015-02-15: 1400 [IU] via INTRAVENOUS_CENTRAL
  Filled 2015-02-07 (×2): qty 6
  Filled 2015-02-07: qty 3
  Filled 2015-02-07 (×3): qty 6

## 2015-02-07 MED ORDER — HEPARIN (PORCINE) 2000 UNITS/L FOR CRRT
INTRAVENOUS_CENTRAL | Status: DC | PRN
  Administered 2015-02-08 – 2015-02-09 (×3): via INTRAVENOUS_CENTRAL
  Filled 2015-02-07 (×3): qty 1000

## 2015-02-07 MED ORDER — PRISMASOL BGK 4/2.5 32-4-2.5 MEQ/L IV SOLN
INTRAVENOUS | Status: DC
  Administered 2015-02-08 – 2015-02-13 (×30): via INTRAVENOUS_CENTRAL
  Filled 2015-02-07 (×47): qty 5000

## 2015-02-07 MED ORDER — ALTEPLASE 2 MG IJ SOLR
2.0000 mg | Freq: Once | INTRAMUSCULAR | Status: AC | PRN
  Administered 2015-02-13: 2 mg
  Filled 2015-02-07 (×3): qty 2

## 2015-02-07 MED ORDER — PRISMASOL BGK 4/2.5 32-4-2.5 MEQ/L IV SOLN
INTRAVENOUS | Status: DC
  Administered 2015-02-08 – 2015-02-11 (×7): via INTRAVENOUS_CENTRAL
  Filled 2015-02-07 (×9): qty 5000

## 2015-02-07 MED ORDER — TRACE MINERALS CR-CU-MN-SE-ZN 10-1000-500-60 MCG/ML IV SOLN
INTRAVENOUS | Status: AC
  Administered 2015-02-07: 18:00:00 via INTRAVENOUS
  Filled 2015-02-07: qty 960

## 2015-02-07 MED ORDER — SODIUM CHLORIDE 0.9 % IJ SOLN
10.0000 mL | INTRAMUSCULAR | Status: DC | PRN
  Administered 2015-02-13: 20 mL
  Administered 2015-02-14 – 2015-02-26 (×2): 10 mL
  Filled 2015-02-07 (×3): qty 40

## 2015-02-07 MED ORDER — FAT EMULSION 20 % IV EMUL
120.0000 mL | INTRAVENOUS | Status: AC
  Administered 2015-02-07: 120 mL via INTRAVENOUS
  Filled 2015-02-07: qty 200

## 2015-02-07 MED ORDER — PRISMASOL BGK 4/2.5 32-4-2.5 MEQ/L IV SOLN
INTRAVENOUS | Status: DC
  Administered 2015-02-08 – 2015-02-14 (×7): via INTRAVENOUS_CENTRAL
  Filled 2015-02-07 (×15): qty 5000

## 2015-02-07 MED ORDER — SODIUM CHLORIDE 0.9 % IJ SOLN
10.0000 mL | Freq: Two times a day (BID) | INTRAMUSCULAR | Status: DC
  Administered 2015-02-07: 10 mL
  Administered 2015-02-07: 20 mL
  Administered 2015-02-08 – 2015-02-11 (×5): 10 mL
  Administered 2015-02-13 – 2015-02-14 (×2): 20 mL
  Administered 2015-02-15 – 2015-02-16 (×3): 10 mL
  Administered 2015-02-17: 20 mL
  Administered 2015-02-17 – 2015-02-20 (×4): 10 mL
  Administered 2015-02-21: 20 mL
  Administered 2015-02-21 – 2015-02-23 (×5): 10 mL
  Administered 2015-02-24: 20 mL
  Administered 2015-02-25: 10 mL

## 2015-02-07 MED ORDER — PIPERACILLIN-TAZOBACTAM IN DEX 2-0.25 GM/50ML IV SOLN
2.2500 g | Freq: Three times a day (TID) | INTRAVENOUS | Status: DC
  Administered 2015-02-07 – 2015-02-08 (×4): 2.25 g via INTRAVENOUS
  Filled 2015-02-07 (×6): qty 50

## 2015-02-07 NOTE — Progress Notes (Addendum)
  [facility] Progress Note   Subjective: confused but responds pretty well  Filed Vitals:   02/07/15 0900 02/07/15 1000 02/07/15 1100 02/07/15 1200  BP:    136/70  Pulse: 102 98 101 99  Temp:    98.7 F (37.1 C)  TempSrc:    Oral  Resp: Height:      Weight:      SpO2: 92% 95% 97% 95%   Exam: Pale, no distress No jvd Chest rales R base, L clear RRR no MRG Abd soft ntnd +BS, very obese vs ascites, RUQ drain 2-3+ pitting edema bilat LE's from feet to hips/ scrotum Neuro no asterixis, disoriented but conversant  CXR 8/20 no acute changes UA renal 11-12 cm, no hydro, normal echo     Assessment: 1 AKI prolonged ATN, oliguric now, low FeNa however has 3+ edema LE's, creat rising daily and pt confused, likely uremic 2 Anemia  3 RUQ abscess on AB + drain - CT today shows no improvement in gas/fluid collection RUQ since 8/18 study 4 Obesity 5 DM2  6 Nutrition to start TNA 7 Vol difficult to assess, low CVP 5 but sig LE edema; will get vol challenge with TNA but don't think volume is the issue more  Plan - since he is not real stable will keep here and plan for CRRT, will d/w CCM for cath placement.     Vinson Moselle MD  pager [phone]    cell [phone]  02/07/2015, 1:34 PM     Recent Labs Lab 02/05/15 0630 02/06/15 0425 02/07/15 0352  NA 136 136 134*  K 3.3* 3.6 3.9  CL 99* 99* 98*  CO2 GLUCOSE 115* 117* 79  BUN 48* 60* 60*  CREATININE 3.95* 4.63* 5.25*  CALCIUM 8.4* 8.5* 8.6*  PHOS 4.9* 5.5* 5.5*    Recent Labs Lab 02/06/15 0425 02/07/15 0352  AST 20 20  ALT 13* 11*  ALKPHOS 87 99  BILITOT 1.0 1.4*  PROT 6.5 6.5  ALBUMIN 2.2* 2.4*    Recent Labs Lab 02/05/15 0630 02/06/15 0425 02/07/15 0352  WBC 13.3* 14.4* 14.3*  NEUTROABS 11.5* 12.3* 12.1*  HGB 7.3* 7.5* 7.6*  HCT 22.4* 23.0* 23.7*  MCV 90.3 90.2 91.2  PLT 369 372 390   . antiseptic oral rinse  7 mL Mouth Rinse BID  . darbepoetin (ARANESP)  injection - NON-DIALYSIS  200 mcg Subcutaneous Q Wed-1800  . diphenhydrAMINE  12.5 mg Intravenous Once  . feeding supplement  1 Container Oral TID BM  . fluconazole  100 mg Oral Q24H  . insulin aspart  0-15 Units Subcutaneous 6 times per day  . insulin glargine  35 Units Subcutaneous QHS  . megestrol  40 mg Oral Daily  . nystatin   Topical TID  . ondansetron  8 mg Oral BID  . piperacillin-tazobactam (ZOSYN)  IV  2.25 g Intravenous 3 times per day   . sodium chloride 10 mL/hr at 01/31/15 0508  . TPN (CLINIMIX) Adult without lytes     And  . fat emulsion    . heparin 2,400 Units/hr (02/07/15 1245)   acetaminophen, fentaNYL (SUBLIMAZE) injection, iohexol, levalbuterol, LORazepam, ondansetron **OR** ondansetron (ZOFRAN) IV, oxyCODONE
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
## 2015-02-07 NOTE — Progress Notes (Signed)
Nutrition Follow-up  DOCUMENTATION CODES:   Morbid obesity  INTERVENTION:  - Monitor magnesium, potassium, and phosphorus daily for at least 3 days, MD to replete as needed, as pt is at risk for refeeding syndrome given poor PO intakes x18 days. - Recommend enteral nutrition over parenteral nutrition due to functioning gut. Recommend Panda tube with goal of Vital AF 1.2 @ 60 mL/hr to provide 1728 kcal (97% minimum needs), 108 grams protein, and 1168 mL free water. Recommend initiation @ 15 mL/hr and advance by 10 mL Q12h to monitor for refeeding. - TPN per pharmacy if enteral nutrition is not feasible - RD will continue to monitor for needs  NUTRITION DIAGNOSIS:   Inadequate oral intake related to lethargy/confusion, poor appetite as evidenced by meal completion < 50%. -ongoing  GOAL:   Patient will meet greater than or equal to 90% of their needs -unmet  MONITOR:   Diet advancement, Weight trends, Labs, I & O's  REASON FOR ASSESSMENT:   Consult New TPN/TNA  ASSESSMENT:   69 y/o admitted in 10/05/2012 with Sepsis, hypotension, acute renal failure and acute cholecystitis. He had been place on Eliquis for his AF by the [place]. He had an MI 2 years ago and did not follow up with his cardiologist. He was acutely ill and underwent a percutaneous drain placement on 10/06/14 by IR. He went home on 10/11/14. He was readmitted on 12/30/14 and underwent laparoscopic cholecystectomy with IOC. He was left with a drain in place and the site had "SNOW' placed in the GB fossa for hemostasis after the procedure. Drain was removed last week but there was some brusing at the site. He is transferred from [facility] with complaints of increased abdominal pain.  8/22 Follow-up for calorie count shows pt consumed 1/2 junior Whopper last night. Talked with wife who reports pt sleeps most of the day every day. She states that pt had been requesting Whopper but had difficulty eating much of it last night due to  prolonged chewing time.   New consult for TPN. Talked with pharmacist who reports that discussion had by MD indicated that TF was discussed but then order for TPN initiation placed due to several instances of emesis. He also states that repeat CT to be done today. No prokinect agents ordered; recommend trial of scheduled Reglan to assess if this aids in transit time and resolves emesis with oral/enteral route of nutrition.  Discussion between RN and pharmacist reveals that pt to go to [facility] for dialysis and the nephrology concerned about PICC placement (PICC not yet in place).  Concern for volume overload and refeeding with TPN. Pharmacist states plan to start TPN @ 50% goal rate initially. Order for TPN currently on hold per pharmacist pending dialysis at [facility]  Not meeting needs. Medications reviewed; Megace order placed 8/19. Labs reviewed; CBGs: 67-137 mg/dL, Cl: 98 mmol/L, Na: 409 mmol/L, BUN/creatinine elevated, Ca: 8.6 mg/dL, GFR: 10, Phos: 5.5 mg/dL.    8/19 - Pt ate 100% of dinner yesterday which consisted of diet Sprite, deli sandwich with mayo, deli ham, and lettuce, vanilla ice cream, and 8 oz whole milk (578 kcal, 21.5 grams protein). - RN reports pt had nothing for breakfast so far this AM but he did drink 1/2 Boost Breeze (145 kcal, 4.5 grams protein). - Will d/c Prostat as RN states pt has had several instances of refusal of this supplement and will order vanilla Magic Cup BID as pt seems to like vanilla ice cream.  8/15 -  Pt's diet advanced to Soft on 8/10 but pt has only been eating 5-10% on average.  - Pt reports abdominal pain which is consistent and not exacerbated by intakes.  - Family member asks about good options on Soft diet.  - Will order supplements; feel pt will do better with a clear liquid supplement at this time and will order Prostat to increase protein. - If poor intakes persist, recommend Panda tube with VITAL AF 1.2 @ 60 mL/hr to provide 1728 kcal (97%  minimal needs), 108 grams protein, and 1168 mL free water  8/9 -Pt admitted 8/3 and on CLD or NPO since that date (6 days).  - Even on CLD pt has consumed very little and is unable to meet nutritional needs.  - CCM notes indicate oliguric renal failure and Hgb stable; pt previously with blood in abdomen. - Recommend Panda tube with TF as outlined above if possible. If unable to feed pt enterally, recommend TPN.   8/4 - Pt has had a poor appetite for 1 month PTA and las good meal was 8/2 - N/V with unknown start date and complaining of 10/10 pain recently   Diet Order:  DIET SOFT Room service appropriate?: Yes; Fluid consistency:: Thin TPN (CLINIMIX) Adult without lytes  Skin:  Wound (see comment) (abdominal wound (first recorded 01/04/15))  Last BM:  8/18  Height:   Ht Readings from Last 1 Encounters:  01/20/15  (1.676 m)    Weight:   Wt Readings from Last 1 Encounters:  02/04/15 263 lb 3.7 oz (119.4 kg)    Ideal Body Weight:  64.54 kg (kg)  BMI:  Body mass index is 42.51 kg/(m^2).  Estimated Nutritional Needs:   Kcal:  1914-7829  Protein:  95-105 grams  Fluid:  1.7-2.2 L/day  EDUCATION NEEDS:   No education needs identified at this time     Trenton Gammon, RD, LDN Inpatient Clinical Dietitian Pager # [phone] After hours/weekend pager # [phone]

## 2015-02-07 NOTE — Progress Notes (Addendum)
Subjective: Cr bumped over weekend. Didn't work with PT. Intermittent vomiting.   Objective: Vital signs in last 24 hours: Temp:  [96.2 F (35.7 C)-98.6 F (37 C)] 97.7 F (36.5 C) (08/22 0400) Pulse Rate:  [83-102] 89 (08/22 0400) Resp:  [12-28] 20 (08/22 0400) BP: (88-140)/(40-111) 140/111 mmHg (08/22 0400) SpO2:  [86 %-97 %] 93 % (08/22 0400) Last BM Date: 02/03/15  Intake/Output from previous day: 08/21 0701 - 08/22 0700 In: 1540.5 [P.O.:720; I.V.:698; IV Piggyback:112.5] Out: 265 [Urine:260; Drains:5] Intake/Output this shift:    Sleepy but oriented to person, place, year, month. But o/w not conversive cta b/l Reg Obese, soft, nt. Drain intact Trace edema  Lab Results:   Recent Labs  02/06/15 0425 02/07/15 0352  WBC 14.4* 14.3*  HGB 7.5* 7.6*  HCT 23.0* 23.7*  PLT 372 390   BMET  Recent Labs  02/06/15 0425 02/07/15 0352  NA 136 134*  K 3.6 3.9  CL 99* 98*  CO2 27 24  GLUCOSE 117* 79  BUN 60* 60*  CREATININE 4.63* 5.25*  CALCIUM 8.5* 8.6*   Hepatic Function Latest Ref Rng 02/07/2015 02/06/2015 01/30/2015  Total Protein 6.5 - 8.1 g/dL 6.5 6.5 5.6(L)  Albumin 3.5 - 5.0 g/dL 2.4(L) 2.2(L) 2.1(L)  AST 15 - 41 U/L _0 ALT 17 - 63 U/L 11(L) 13(L) 14(L)  Alk Phosphatase 38 - 126 U/L 99 87 65  Total Bilirubin 0.3 - 1.2 mg/dL 1.4(H) 1.0 1.3(H)     PT/INR No results for input(s): LABPROT, INR in the last 72 hours. ABG No results for input(s): PHART, HCO3 in the last 72 hours.  Invalid input(s): PCO2, PO2  Studies/Results: US Renal  02/06/2015   CLINICAL DATA:  Patient with acute renal failure. Prior cholecystectomy.  EXAM: RENAL / URINARY TRACT ULTRASOUND COMPLETE  COMPARISON:  CT abdomen pelvis 02/03/2015  FINDINGS: Right Kidney:  Length: 11.0 cm. Echogenicity within normal limits. No mass or hydronephrosis visualized.  Left Kidney:  Length: 12.0 cm. Echogenicity within normal limits. No mass or hydronephrosis visualized.  Bladder:  Foley  catheter.  Splenomegaly measuring 17.6 cm.  Ascites.  IMPRESSION: No hydronephrosis.  Splenomegaly.  Ascites.   Electronically Signed   By: Lovey Newcomer M.D.   On: 02/06/2015 13:29   Dg Chest Port 1 View  02/05/2015   CLINICAL DATA:  Recent nausea and vomiting and shortness of Breath  EXAM: PORTABLE CHEST - 1 VIEW  COMPARISON:  01/30/2015  FINDINGS: Cardiac shadow is enlarged but stable. A right jugular line is again seen and stable. Elevation the right hemidiaphragm is again noted. A right upper quadrant drainage catheter is again noted. No focal infiltrate or sizable effusion is seen.  IMPRESSION: No acute abnormality noted.   Electronically Signed   By: Inez Catalina M.D.   On: 02/05/2015 11:52    Anti-infectives: Anti-infectives    Start     Dose/Rate Route Frequency Ordered Stop   02/07/15 1400  piperacillin-tazobactam (ZOSYN) IVPB 2.25 g     2.25 g 100 mL/hr over 30 Minutes Intravenous 3 times per day 02/07/15 0750     02/04/15 1200  piperacillin-tazobactam (ZOSYN) IVPB 2.25 g  Status:  Discontinued     2.25 g 100 mL/hr over 30 Minutes Intravenous 4 times per day 02/04/15 0859 02/04/15 0905   02/04/15 1200  piperacillin-tazobactam (ZOSYN) IVPB 3.375 g  Status:  Discontinued     3.375 g 12.5 mL/hr over 240 Minutes Intravenous Every 8 hours 02/04/15 0905 02/07/15 0750  02/02/15 2000  fluconazole (DIFLUCAN) tablet 100 mg     100 mg Oral Every 24 hours 02/02/15 1908 02/09/15 1959   02/02/15 1800  fluconazole (DIFLUCAN) tablet 100 mg  Status:  Discontinued     100 mg Oral Daily 02/02/15 1545 02/02/15 1901   01/31/15 1000  fluconazole (DIFLUCAN) tablet 100 mg  Status:  Discontinued     100 mg Oral Daily 01/31/15 0727 02/02/15 1313   01/29/15 1700  piperacillin-tazobactam (ZOSYN) IVPB 2.25 g  Status:  Discontinued     2.25 g 100 mL/hr over 30 Minutes Intravenous Every 8 hours 01/29/15 0845 02/04/15 0859   01/24/15 2200  vancomycin (VANCOCIN) IVPB 1000 mg/200 mL premix     1,000 mg 200  mL/hr over 60 Minutes Intravenous  Once 01/24/15 1349 01/24/15 2301   01/21/15 2000  vancomycin (VANCOCIN) 1,500 mg in sodium chloride 0.9 % 500 mL IVPB  Status:  Discontinued     1,500 mg 250 mL/hr over 120 Minutes Intravenous Every 24 hours 01/21/15 1048 01/23/15 1726   01/20/15 2000  vancomycin (VANCOCIN) 1,250 mg in sodium chloride 0.9 % 250 mL IVPB  Status:  Discontinued     1,250 mg 166.7 mL/hr over 90 Minutes Intravenous Every 24 hours 01/19/15 1945 01/20/15 0843   01/20/15 2000  vancomycin (VANCOCIN) 1,250 mg in sodium chloride 0.9 % 250 mL IVPB  Status:  Discontinued     1,250 mg 166.7 mL/hr over 90 Minutes Intravenous Every 24 hours 01/20/15 1519 01/21/15 1048   01/20/15 0000  piperacillin-tazobactam (ZOSYN) IVPB 3.375 g  Status:  Discontinued     3.375 g 12.5 mL/hr over 240 Minutes Intravenous Every 8 hours 01/19/15 1946 01/29/15 0845   01/19/15 1715  vancomycin (VANCOCIN) 2,500 mg in sodium chloride 0.9 % 500 mL IVPB     2,500 mg 250 mL/hr over 120 Minutes Intravenous  Once 01/19/15 1707 01/19/15 2159   01/19/15 1715  piperacillin-tazobactam (ZOSYN) IVPB 3.375 g     3.375 g 100 mL/hr over 30 Minutes Intravenous  Once 01/19/15 1707 01/19/15 1900      Assessment/Plan: S/p interval lap cholecystectomy 7/18 GB fossa infected hematoma - cont drain, f/u repeat drain cx. Drain output remained low overweekend. Will repeat ct today. If hematoma persistent, have asked IR to do TPA thru drain. Discussed with Dr Reesa Chew.  Cont zosyn for now per ID. F/u ID recs ARF on CKD - Cr starting to trend up, uop ok. Cont foley for now. Edema  Essentially resolved. Concerned that he may be intravascular volume depleted since he really isn't drinking/eating. Discussed with renal concern about volume status.  Anemia - hgb stable. Got feraheme, and getting aranesp q2weeks Moderate PCMN - cont calorie counts. Cont diet and Resource. Encouraged PO. cont megace. Calorie count over weekend was very poor. Will  start PICC/TPN. Ideally would like to use enteral feeds; however he is having intermittent emesis - what i saw this am looked more like phlegm. Will start with TPN which should help with volume a little bit. Did discuss nasogastric feeds with wife and g tube. I think we are heading toward enteral feeds since his PO is quite bad.  Yeast UTI - diflucan til 8/24 per ID Paroxsymal afib - on IV heparin, per a previous renal note - not a candidat for eliquis anymore. Will start/hold on coumadin til early next week since may need to instill TPA in hematoma.   Extensive discussion with wife. Expressed to her concern he may be getting  a little delirium which could potentially worsen.   Will use CT today to also evaluate stomach position for potential gastrostomy tube insertion.   Discussed with Dr Charlies Silvers. She expressed concerns about possible uremia and potential need for HD in near future and was requesting tx to [facility].  I expressed that I would prefer he stay at [facility] for his CT, potential drain TPA, and PICC line and then transfer to [facility] on Tuesday. She also advised that since his medical problems seem to be more of an issue now she offered [facility] takeover as primary team at [facility]. I thanked her and agreed that at this point [facility] would be more appropriate to be primary. I will still follow pt while at [facility].  I updated wife and plans to tx pt to [facility] on Tuesday and rationale for transfer  Dr Zella Richer is available today for questions I will return Tuesday  Leighton Ruff. Redmond Pulling, MD, FACS General, Bariatric, & Minimally Invasive Surgery [facility] Surgery, [place]   LOS: 19 days    Gayland Curry 02/07/2015
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
## 2015-02-07 NOTE — Progress Notes (Signed)
PT Cancellation Note  Patient Details Name: Larry [name] MRN: [id number] DOB: 05-17-1946   Cancelled Treatment:    Reason Eval/Treat Not Completed: Patient at procedure or test/unavailablegetting PICC.   Rada Hay 02/07/2015, 3:50 PM Blanchard Kelch PT [phone]
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
## 2015-02-07 NOTE — Progress Notes (Signed)
Patient ID: Larry [name], male   DOB: 1945/11/25, 69 y.o.   MRN: [id number] [facility] PROGRESS NOTE  Larry [name] VHQ:[id number] DOB: 08/22/1945 DOA: 01/19/2015 PCP: Larry [name]  Brief narrative:    69 year old male with past medical history of atrial fibrillation (on anticoagulation with Eliquis), hypertension, dyslipidemia, diabetes. Patient underwent laparoscopic cholecystectomy on 01/03/2015. He subsequently developed infected gallbladder fossa hematoma and has underwent drain placement (cultures from the fluid analysis yielded mixed organisms). Patient presented to [facility] 01/19/2015 with septic shock and was then transferred to [facility] for management of infected hematoma. He has required pressor support on admission but has been off pressors since 01/21/2015. He has also required a total of 2 units of PRBC transfusion because of increasing intraperitoneal hemorrhage on 01/23/2015.  Patient's hospital course has been complicated with the following issues: 1. Ongoing pain at the drain site for which reason patient had a repeat CT abdomen 02/03/2015 which demonstrated 9.8 cm hematoma with gas in the gallbladder fossa with indwelling pigtail drainage catheter, mildly decreased, 12.6 cm hematoma along the posterior right liver, mildly increased, moderate abdominopelvic ascites. His drain was changed to larger size by IR 02/04/2015.  2. Worsening renal insufficiency - creatinine has worsened over past few days. Patient was on high dose of lasix for anasarca / volume overload tthought to be from IV fluid resuscitation during the initial phases of septic shock. Lasix was stopped 02/05/2015 per nephrology recommendations. We are continuing to monitor renal function and patient may need to be transferred to [facility] in case he requires dialysis.    Barrier to discharge: per surgery, plan to have CT to evaluate stomach position for potential gastrostomy tube insertion and potential drain PTA to dissolve any  potential clots. Plan for PICC placement to start TPN for nutritional support. Since main issues at this time are mostly medical it is reasonable [facility] assumes as primary team and we appreciate surgery continuing to follow along with their recommendations.    Assessment/Plan:    Principal Problem: Septic shock secondary to complicated cholecystitis status post cholecystectomy 01/03/2015 with infected hematoma / Leukocytosis - Septic shock present on admission thought to be secondary to infected gallbladder fossa hematoma status post recent cholecystectomy and drain placement  - Because of ongoing abdominal pain at the drain site CT was repeated 8/18 and demonstrated 9.8 cm hematoma with gas in the gallbladder fossa with indwelling pigtail drainage catheter, mildly decreased, 12.6 cm hematoma along the posterior right liver, mildly increased, moderate abdominopelvic ascites. - Pt underwent upsize of percutaneous drain 8/18 by IR - Surgery recommends obtaining repeat CT scan today to evaluate stomach position for potential gastrostomy tube insertion and potential drain PTA to dissolve any potential clots.  - Plan for PICC placement to start TPN for nutritional support.  - Pt is on zosyn, appreciate ID following and their recommendations.   Active Problems: Yeast UTI / catheter related urinary tract infection  - Continue fluconazole per infectious disease recommendations through 02/09/2015   Acute hypoxic respiratory failure secondary to volume overload and anasarca - Likely from initial fluid resuscitation for septic shock - Patient was on IV Lasix 80 mg IV every 8 hours which was transitioned to oral Lasix 02/04/2015. Because of worsening renal function Lasix stopped 02/05/2015. - Chest x-ray done 02/05/2015 showed no acute abnormalities. - Weight over past 72 hours: 122.3 kg --> 119.4 kg - Continue daily weight and strict intake and output  Acute renal failure - Likely from ATN associated with  sepsis during the initial phase of hospitalization.  - Creatinine initially improving and at lowest 3.63 on 02/04/2015. Renal function however worsening over past 48-72 hours  - Renal US did not show hydronephrosis. - Nephrology has stopped Lasix 02/05/2015. - Follow up BMP tomorrow am; if it continues to trend up then we will most likely transfer the pt to [facility] tomorrow.   Hypokalemia/hypomagnesemia - Secondary to lasix - Supplemented   Anemia of chronic disease / Acute blood loss anemia - Due to anticoagulation and hematoma - S/P 2 units of PRBC (on 01/23/2015) - Aranesp given every Wednesday (last dose 8/17) - Feraheme IV Q 72 hours (last given 8/17) - Hemoglobin 7.6 this morning.  History of atrial fibrillation - CHADS vasc score at least 3 - On eliquis at home but now on IV heparin. Because of worsening renal insufficiency patient is no longer candidate for Eliquis and once he is stable for discharge he will be likely transitioned to coumadin.  - Rate controlled   Diabetes mellitus type 2, uncontrolled - A1c 8.2 indicating poor glycemic control - Continue Lantus 35 units at bedtime and SSI - CBG's in past 24 hours: 68, 74, 119  Morbid obesity / severe protein calorie malnutrition - Body mass index is 45.35 kg/(m^2). - Patient with very minimal by mouth intake likely in the setting of acute illness - Nutrition consulted  - Start TPN today per surgery recommendations - Continue nutritional supplementation   DVT Prophylaxis  - IV heparin      Code Status: Full.  Family Communication:  Family not at the bedside this am.  Disposition Plan: remains in SDU due to complexity of medical issues including but not limited to worsening renal failure and mental status.  IV access:  Peripheral IV  Procedures and diagnostic studies:    CT's Ct Abdomen Pelvis Wo Contrast 02/03/2015 9.8 cm hematoma with gas in the gallbladder fossa, with indwelling pigtail drainage catheter,  mildly decreased. 12.6 cm hematoma along the posterior right liver, mildly increased. Moderate abdominopelvic ascites, stable versus mildly increased. Mild wall thickening involving the right colon, possibly secondary to ascites and right upper quadrant inflammation. Electronically Signed By: Charline Bills M.D. On: 02/03/2015 11:22   Ct Abdomen Pelvis Wo Contrast 01/28/2015 The gallbladder fossa hematoma/abscess has not significantly changed in size. Stable position of the percutaneous drainage catheter. Stable appearance of the large perihepatic or subcapsular liver hematoma. Additional hematomas along the right anterior abdomen are stable. No significant change in the amount of ascites. Again noted is a small amount of perisplenic fluid. Bilateral pleural effusions, right side greater the left. Stable volume loss and consolidation in the right lower lobe. Electronically Signed By: Richarda Overlie M.D. On: 01/28/2015 07:23   Ct Abdomen Pelvis Wo Contrast 01/22/2015 Post percutaneous drainage 's of a gallbladder fossa abscess collection, collection now larger and higher in attenuation than on the previous study question interval hemorrhage. Additionally, increased flow void with high density layering noted perihepatic compatible with increased intraperitoneal hemorrhage. Increased ascites. Mild increase in RIGHT pleural effusion and basilar atelectasis. Findings called to Dr. Gerrit Friends on 01/22/2015 at 1704 hr. Electronically Signed By: Ulyses Southward M.D. On: 01/22/2015 17:06   Ct Abdomen Pelvis W Contrast 01/13/2015 1. Gas and fluid at the cholecystectomy site following Reese removal of the drainage catheter is likely within normal limits. No discrete abscess or organized fluid collection is present. Follow-up CT scan in 1-2 weeks or nuclear medicine hepatobiliary scan could be used for further evaluation if the  patient's symptoms persist 2. Minimal ascites about the liver is likely  reactive. No significant focal attenuation differences are evident adjacent to the cholecystectomy site. 3. Extensive atherosclerotic changes including coronary artery disease. 4. Small right pleural effusion and associated atelectasis. 5. Degenerative change within the thoracolumbar spine as described.   CXR's Dg Chest Port 1 View 02/05/2015 No acute abnormality noted.   Dg Chest Port 1 View 01/30/2015 Right IJ central line with tip in right atrium again noted. No pulmonary edema. There is chronic elevation of the right hemidiaphragm with right basilar atelectasis.  Dg Chest Port 1 View 01/21/2015 Persistent RIGHT basilar atelectasis.   Dg Chest Port 1 View 01/20/2015 1. New right IJ central line with tip at the right atrial level. No pneumothorax. 2. Low lung volumes with bibasilar atelectasis or pneumonia.   Dg Chest Port 1 View 01/19/2015 Mild opacification in the right perihilar region and medial right base which may be due to atelectasis or infection.   Ultrasound US Renal 02/06/2015    No hydronephrosis.  Splenomegaly.  Ascites.     Procedures: Ct Image Guided Drainage Percut Cath Peritoneal Retroperit 01/20/2015 Successful CT-guided gallbladder fossa abscess drain insertion.   Medical Consultants:  Surgery Nephrology Infectious disease Interventional radiology   Other Consultants:  Diabetic coordinator Nutrition  PT/OT  IAnti-Infectives:   Diflucan 02/02/2015 -->  Zosyn 01/19/2015 -->    DEVINE, ALMA, MD  [facility] Pager [phone]  Time spent in minutes: 25 minutes  If 7PM-7AM, please contact night-coverage [email or web address] Password [email or web address] 02/07/2015, 9:10 AM   LOS: 19 days    HPI/Subjective: No acute overnight events. Patient more confused this am.   Objective: Filed Vitals:   02/06/15 1905 02/06/15 2000 02/07/15 0000 02/07/15 0400  BP:  96/54 119/62 140/111  Pulse: 90 87 83 89  Temp:  98 F (36.7 C) 97.9 F (36.6 C) 97.7 F (36.5 C)   TempSrc:  Oral Oral Oral  Resp: Height:      Weight:      SpO2: 92% 96% 90% 93%    Intake/Output Summary (Last 24 hours) at 02/07/15 0910 Last data filed at 02/07/15 0500  Gross per 24 hour  Intake 1472.5 ml  Output    265 ml  Net 1207.5 ml    Exam:   General:  Pt is not in acute distress  Cardiovascular: Regular rate and rhythm, S1/S2 appreciated   Respiratory: No wheezing, no crackles, no rhonchi  Abdomen: drain in place in right abdomen, non tender, appreciate bowel sounds   Extremities: +1 LE pitting edema, pulses palpable bilaterally  Neuro: Nonfocal  Data Reviewed: Basic Metabolic Panel:  Recent Labs Lab 02/03/15 0359 02/04/15 0240 02/05/15 0630 02/06/15 0425 02/07/15 0352  NA 137 136 136 136 134*  K 3.6 3.5 3.3* 3.6 3.9  CL 101 99* 99* 99* 98*  CO2 GLUCOSE 136* 147* 115* 117* 79  BUN 49* 50* 48* 60* 60*  CREATININE 3.79* 3.63* 3.95* 4.63* 5.25*  CALCIUM 8.8* 8.6* 8.4* 8.5* 8.6*  MG 1.6* 1.5* 1.5* 2.0 2.1  PHOS 3.6 3.8 4.9* 5.5* 5.5*   Liver Function Tests:  Recent Labs Lab 02/06/15 0425 02/07/15 0352  AST 20 20  ALT 13* 11*  ALKPHOS 87 99  BILITOT 1.0 1.4*  PROT 6.5 6.5  ALBUMIN 2.2* 2.4*   No results for input(s): LIPASE, AMYLASE in the last 168 hours. No results for input(s): AMMONIA in the last 168 hours.  CBC:  Recent Labs Lab 02/03/15 0359 02/04/15 0240 02/05/15 0630 02/06/15 0425 02/07/15 0352  WBC 13.3* 14.5* 13.3* 14.4* 14.3*  NEUTROABS 11.1* 12.7* 11.5* 12.3* 12.1*  HGB 8.1* 7.8* 7.3* 7.5* 7.6*  HCT 24.9* 23.9* 22.4* 23.0* 23.7*  MCV 88.9 89.2 90.3 90.2 91.2  PLT 398 360 369 372 390   Cardiac Enzymes: No results for input(s): CKTOTAL, CKMB, CKMBINDEX, TROPONINI in the last 168 hours. BNP: Invalid input(s): POCBNP CBG:  Recent Labs Lab 02/06/15 2054 02/07/15 0035 02/07/15 0426 02/07/15 0506 02/07/15 0528  GLUCAP 118* 82 67 68 74    Recent Results (from the past 240 hour(s))   Urine culture     Status: None   Collection Time: 01/30/15 11:40 AM  Result Value Ref Range Status   Specimen Description URINE, RANDOM  Final   Special Requests NONE  Final   Culture   Final    >=100,000 COLONIES/mL YEAST Performed at [facility]    Report Status 02/01/2015 FINAL  Final     Scheduled Meds: . antiseptic oral rinse  7 mL Mouth Rinse BID  . darbepoetin (ARANESP) injection - NON-DIALYSIS  200 mcg Subcutaneous Q Wed-1800  . diphenhydrAMINE  12.5 mg Intravenous Once  . feeding supplement  1 Container Oral TID BM  . fluconazole  100 mg Oral Q24H  . insulin aspart  0-15 Units Subcutaneous 6 times per day  . insulin glargine  35 Units Subcutaneous QHS  . megestrol  40 mg Oral Daily  . nystatin   Topical TID  . ondansetron  8 mg Oral BID  . piperacillin-tazobactam (ZOSYN)  IV  2.25 g Intravenous 3 times per day   Continuous Infusions: . sodium chloride 10 mL/hr at 01/31/15 0508  . heparin 2,400 Units/hr (02/07/15 0200)

## 2015-02-07 NOTE — Progress Notes (Signed)
Peripherally Inserted Central Catheter/Midline Placement  The IV Nurse has discussed with the patient and/or persons authorized to consent for the patient, the purpose of this procedure and the potential benefits and risks involved with this procedure.  The benefits include less needle sticks, lab draws from the catheter and patient may be discharged home with the catheter.  Risks include, but not limited to, infection, bleeding, blood clot (thrombus formation), and puncture of an artery; nerve damage and irregular heat beat.  Alternatives to this procedure were also discussed.  PICC/Midline Placement Documentation        Lisabeth Devoid 02/07/2015, 3:07 PM Consent obtained from wife at bedside.

## 2015-02-08 ENCOUNTER — Inpatient Hospital Stay (HOSPITAL_COMMUNITY): Payer: Medicare Other

## 2015-02-08 DIAGNOSIS — D638 Anemia in other chronic diseases classified elsewhere: Secondary | ICD-10-CM

## 2015-02-08 DIAGNOSIS — R6521 Severe sepsis with septic shock: Secondary | ICD-10-CM

## 2015-02-08 DIAGNOSIS — N179 Acute kidney failure, unspecified: Secondary | ICD-10-CM | POA: Insufficient documentation

## 2015-02-08 DIAGNOSIS — N189 Chronic kidney disease, unspecified: Secondary | ICD-10-CM

## 2015-02-08 DIAGNOSIS — E43 Unspecified severe protein-calorie malnutrition: Secondary | ICD-10-CM | POA: Insufficient documentation

## 2015-02-08 LAB — GLUCOSE, CAPILLARY
GLUCOSE-CAPILLARY: 213 mg/dL — AB (ref 65–99)
GLUCOSE-CAPILLARY: 191 mg/dL — AB (ref 65–99)
Glucose-Capillary: 140 mg/dL — ABNORMAL HIGH (ref 65–99)
Glucose-Capillary: 166 mg/dL — ABNORMAL HIGH (ref 65–99)
Glucose-Capillary: 194 mg/dL — ABNORMAL HIGH (ref 65–99)
Glucose-Capillary: 217 mg/dL — ABNORMAL HIGH (ref 65–99)

## 2015-02-08 LAB — COMPREHENSIVE METABOLIC PANEL
ALBUMIN: 2.1 g/dL — AB (ref 3.5–5.0)
ALT: 19 U/L (ref 17–63)
ANION GAP: 13 (ref 5–15)
AST: 58 U/L — ABNORMAL HIGH (ref 15–41)
Alkaline Phosphatase: 131 U/L — ABNORMAL HIGH (ref 38–126)
BUN: 66 mg/dL — ABNORMAL HIGH (ref 6–20)
CHLORIDE: 94 mmol/L — AB (ref 101–111)
CO2: 24 mmol/L (ref 22–32)
CREATININE: 6.42 mg/dL — AB (ref 0.61–1.24)
Calcium: 8.4 mg/dL — ABNORMAL LOW (ref 8.9–10.3)
GFR calc non Af Amer: 8 mL/min — ABNORMAL LOW (ref >60)
GFR, EST AFRICAN AMERICAN: 9 mL/min — AB (ref >60)
Glucose, Bld: 227 mg/dL — ABNORMAL HIGH (ref 65–99)
Potassium: 3.9 mmol/L (ref 3.5–5.1)
SODIUM: 131 mmol/L — AB (ref 135–145)
Total Bilirubin: 1 mg/dL (ref 0.3–1.2)
Total Protein: 6.6 g/dL (ref 6.5–8.1)

## 2015-02-08 LAB — MAGNESIUM: Magnesium: 2 mg/dL (ref 1.7–2.4)

## 2015-02-08 LAB — CBC
HEMATOCRIT: 21.7 % — AB (ref 39.0–52.0)
Hemoglobin: 6.8 g/dL — CL (ref 13.0–17.0)
MCH: 28 pg (ref 26.0–34.0)
MCHC: 31.3 g/dL (ref 30.0–36.0)
MCV: 89.3 fL (ref 78.0–100.0)
Platelets: 330 10*3/uL (ref 150–400)
RBC: 2.43 MIL/uL — AB (ref 4.22–5.81)
RDW: 17.1 % — ABNORMAL HIGH (ref 11.5–15.5)
WBC: 17.4 10*3/uL — AB (ref 4.0–10.5)

## 2015-02-08 LAB — APTT: aPTT: 101 seconds — ABNORMAL HIGH (ref 24–37)

## 2015-02-08 LAB — PREPARE RBC (CROSSMATCH)

## 2015-02-08 LAB — HEPARIN LEVEL (UNFRACTIONATED)
HEPARIN UNFRACTIONATED: 0.27 [IU]/mL — AB (ref 0.30–0.70)
Heparin Unfractionated: 0.48 IU/mL (ref 0.30–0.70)

## 2015-02-08 LAB — PHOSPHORUS: Phosphorus: 5.8 mg/dL — ABNORMAL HIGH (ref 2.5–4.6)

## 2015-02-08 LAB — TRIGLYCERIDES: Triglycerides: 155 mg/dL — ABNORMAL HIGH (ref <150)

## 2015-02-08 MED ORDER — ALBUTEROL SULFATE (2.5 MG/3ML) 0.083% IN NEBU
5.0000 mg | INHALATION_SOLUTION | Freq: Once | RESPIRATORY_TRACT | Status: AC
  Administered 2015-02-08: 5 mg via RESPIRATORY_TRACT
  Filled 2015-02-08: qty 6

## 2015-02-08 MED ORDER — CLINIMIX/DEXTROSE (5/15) 5 % IV SOLN
INTRAVENOUS | Status: AC
  Administered 2015-02-08: 18:00:00 via INTRAVENOUS
  Filled 2015-02-08: qty 960

## 2015-02-08 MED ORDER — IPRATROPIUM BROMIDE 0.02 % IN SOLN
0.5000 mg | Freq: Once | RESPIRATORY_TRACT | Status: AC
  Administered 2015-02-08: 0.5 mg via RESPIRATORY_TRACT
  Filled 2015-02-08: qty 2.5

## 2015-02-08 MED ORDER — PIPERACILLIN-TAZOBACTAM 3.375 G IVPB 30 MIN
3.3750 g | Freq: Four times a day (QID) | INTRAVENOUS | Status: DC
  Administered 2015-02-08 – 2015-02-09 (×4): 3.375 g via INTRAVENOUS
  Filled 2015-02-08 (×7): qty 50

## 2015-02-08 MED ORDER — SODIUM CHLORIDE 0.9 % IV SOLN: Freq: Once | INTRAVENOUS | Status: DC

## 2015-02-08 MED ORDER — PANTOPRAZOLE SODIUM 40 MG IV SOLR
40.0000 mg | INTRAVENOUS | Status: DC
  Administered 2015-02-08 – 2015-02-24 (×17): 40 mg via INTRAVENOUS
  Filled 2015-02-08 (×17): qty 40

## 2015-02-08 NOTE — Progress Notes (Signed)
Subjective: hgb down some this am. Still a little confused at times. UOP dramatically down  Objective: Vital signs in last 24 hours: Temp:  [97.6 F (36.4 C)-98.7 F (37.1 C)] 97.6 F (36.4 C) (08/23 0800) Pulse Rate:  [95-110] 108 (08/23 0800) Resp:  [19-39] 29 (08/23 0800) BP: (99-149)/(41-79) 130/67 mmHg (08/23 0800) SpO2:  [90 %-98 %] 95 % (08/23 0800) Weight:  [124.8 kg (275 lb 2.2 oz)] 124.8 kg (275 lb 2.2 oz) (08/23 0800) Last BM Date: 02/04/15  Intake/Output from previous day: 08/22 0701 - 08/23 0700 In: 1388.1 [I.V.:644; IV Piggyback:150; TPN:579.1] Out: 290 [Urine:280; Drains:10] Intake/Output this shift: Total I/O In: 45 [TPN:45] Out: 12 [Urine:12]  Lethargic but somewhat conversive. Oriented to year, month, not city.  cta but some lip pursing (was doing this yesterday) Obese, not as soft as yesterday, nontender Some LE edema  Lab Results:   Recent Labs  02/07/15 0352 02/08/15 0540  WBC 14.3* 17.4*  HGB 7.6* 6.8*  HCT 23.7* 21.7*  PLT 390 330   BMET  Recent Labs  02/07/15 1642 02/08/15 0540  NA 133* 131*  K 4.2 3.9  CL 96* 94*  CO2 26 24  GLUCOSE 133* 227*  BUN 65* 66*  CREATININE 5.83* 6.42*  CALCIUM 8.4* 8.4*   PT/INR No results for input(s): LABPROT, INR in the last 72 hours. ABG No results for input(s): PHART, HCO3 in the last 72 hours.  Invalid input(s): PCO2, PO2  Studies/Results: Ct Abdomen Pelvis Wo Contrast  02/07/2015   CLINICAL DATA:  Status post gallbladder fossa drain, check for resolution  EXAM: CT ABDOMEN AND PELVIS WITHOUT CONTRAST  TECHNIQUE: Multidetector CT imaging of the abdomen and pelvis was performed following the standard protocol without IV contrast.  COMPARISON:  02/03/2015  FINDINGS: The lung bases demonstrate bilateral pleural effusions right greater than left with associated right lower lobe consolidation. These changes are stable from the prior exam.  The liver is stable in appearance. A large perihepatic  fluid collection is again identified and stable. A drain is noted within the gallbladder fossa collection although the collection does not appear to have significantly improved in the interval. It again measures approximately 9.5 x 8.9 cm and demonstrates air within.  The spleen, adrenal glands, pancreas and kidneys are stable in appearance from the prior study.  Free fluid is again noted within the abdomen and pelvis. The bladder is decompressed by Foley catheter. Diverticular change of the colon is seen. Diffuse aortoiliac calcifications are noted. Postoperative changes consistent with a right hip replacement are seen. Degenerative changes of the lumbar spine are noted.  IMPRESSION: Stable perihepatic hematoma when compared with the prior exam.  Stable gallbladder fossa collection despite adequate drainage catheter placement.  Significant free fluid within the abdomen and pelvis slightly increased from the prior exam.  The remainder of the study is stable.   Electronically Signed   By: Alcide Clever M.D.   On: 02/07/2015 11:50   US Renal  02/06/2015   CLINICAL DATA:  Patient with acute renal failure. Prior cholecystectomy.  EXAM: RENAL / URINARY TRACT ULTRASOUND COMPLETE  COMPARISON:  CT abdomen pelvis 02/03/2015  FINDINGS: Right Kidney:  Length: 11.0 cm. Echogenicity within normal limits. No mass or hydronephrosis visualized.  Left Kidney:  Length: 12.0 cm. Echogenicity within normal limits. No mass or hydronephrosis visualized.  Bladder:  Foley catheter.  Splenomegaly measuring 17.6 cm.  Ascites.  IMPRESSION: No hydronephrosis.  Splenomegaly.  Ascites.   Electronically Signed   By: Kenard Gower  Earlene Plater M.D.   On: 02/06/2015 13:29    Anti-infectives: Anti-infectives    Start     Dose/Rate Route Frequency Ordered Stop   02/07/15 1400  piperacillin-tazobactam (ZOSYN) IVPB 2.25 g     2.25 g 100 mL/hr over 30 Minutes Intravenous 3 times per day 02/07/15 0750     02/04/15 1200  piperacillin-tazobactam (ZOSYN) IVPB  2.25 g  Status:  Discontinued     2.25 g 100 mL/hr over 30 Minutes Intravenous 4 times per day 02/04/15 0859 02/04/15 0905   02/04/15 1200  piperacillin-tazobactam (ZOSYN) IVPB 3.375 g  Status:  Discontinued     3.375 g 12.5 mL/hr over 240 Minutes Intravenous Every 8 hours 02/04/15 0905 02/07/15 0750   02/02/15 2000  fluconazole (DIFLUCAN) tablet 100 mg     100 mg Oral Every 24 hours 02/02/15 1908 02/09/15 1959   02/02/15 1800  fluconazole (DIFLUCAN) tablet 100 mg  Status:  Discontinued     100 mg Oral Daily 02/02/15 1545 02/02/15 1901   01/31/15 1000  fluconazole (DIFLUCAN) tablet 100 mg  Status:  Discontinued     100 mg Oral Daily 01/31/15 0727 02/02/15 1313   01/29/15 1700  piperacillin-tazobactam (ZOSYN) IVPB 2.25 g  Status:  Discontinued     2.25 g 100 mL/hr over 30 Minutes Intravenous Every 8 hours 01/29/15 0845 02/04/15 0859   01/24/15 2200  vancomycin (VANCOCIN) IVPB 1000 mg/200 mL premix     1,000 mg 200 mL/hr over 60 Minutes Intravenous  Once 01/24/15 1349 01/24/15 2301   01/21/15 2000  vancomycin (VANCOCIN) 1,500 mg in sodium chloride 0.9 % 500 mL IVPB  Status:  Discontinued     1,500 mg 250 mL/hr over 120 Minutes Intravenous Every 24 hours 01/21/15 1048 01/23/15 1726   01/20/15 2000  vancomycin (VANCOCIN) 1,250 mg in sodium chloride 0.9 % 250 mL IVPB  Status:  Discontinued     1,250 mg 166.7 mL/hr over 90 Minutes Intravenous Every 24 hours 01/19/15 1945 01/20/15 0843   01/20/15 2000  vancomycin (VANCOCIN) 1,250 mg in sodium chloride 0.9 % 250 mL IVPB  Status:  Discontinued     1,250 mg 166.7 mL/hr over 90 Minutes Intravenous Every 24 hours 01/20/15 1519 01/21/15 1048   01/20/15 0000  piperacillin-tazobactam (ZOSYN) IVPB 3.375 g  Status:  Discontinued     3.375 g 12.5 mL/hr over 240 Minutes Intravenous Every 8 hours 01/19/15 1946 01/29/15 0845   01/19/15 1715  vancomycin (VANCOCIN) 2,500 mg in sodium chloride 0.9 % 500 mL IVPB     2,500 mg 250 mL/hr over 120 Minutes  Intravenous  Once 01/19/15 1707 01/19/15 2159   01/19/15 1715  piperacillin-tazobactam (ZOSYN) IVPB 3.375 g     3.375 g 100 mL/hr over 30 Minutes Intravenous  Once 01/19/15 1707 01/19/15 1900      Assessment/Plan: S/p interval lap cholecystectomy 7/18 GB fossa infected hematoma - cont drain, f/u repeat drain cx. Drain output remained low overweekend. CT yesterday was unchanged. Given events planned for today, have asked IR to hold off on TPA instillation thru GB drain.  Cont zosyn for now per ID. F/u ID recs ARF on CKD - uop down. CR up. Getting line placed. Starting dialysis today.  Anemia - hgb down today. Got feraheme, and getting aranesp q2weeks. Getting blood today Moderate PCMN - cont calorie counts. Cont diet and Resource. Encouraged PO. cont megace. Calorie count over weekend was very poor. Will start PICC/TPN. rediscussed G tube today with wife. Have asked IR to  see if there is a window of IR- G tube. If a candidate for IR g tube, will plan on placement Wed/Thursday depending on does today. Yeast UTI - diflucan til 8/24 per ID Paroxsymal afib - on IV heparin, per a previous renal note - not a candidat for eliquis anymore  Updated wife Explained that he could get sicker. Briefly mentioned code status. Discussed that dialysis could be permanent.   Really appreciate [facility], renal, ID assistance  [name]. [name], MD, FACS General, Bariatric, & Minimally Invasive Surgery [facility], [place]   LOS: 20 days    [name] 02/08/2015

## 2015-02-08 NOTE — Progress Notes (Signed)
[facility]    Date of Admission:  01/19/2015   Total days of antibiotics 20        Day 20 piptazo   ID: Larry [name] is a 69 y.o. male with septic shock due to infected hematoma s/p IR drain, protein-caloric malnutrition and aki requiring HD Principal Problem:   Septic shock Active Problems:   Anticoagulated   Paroxysmal a-fib   Acute blood loss anemia   Catheter-associated urinary tract infection   Intra-abdominal infection   Candidiasis of urogenital site   Acute respiratory failure with hypoxia   Infected hematoma following procedure   Leukocytosis   Diabetes mellitus with renal complications   Anemia of chronic disease   Hypokalemia   Hypomagnesemia   Acute renal failure   Protein-calorie malnutrition, severe   AKI (acute kidney injury)    Subjective: Afebrile, but had worsening CR function requiring initiation of HD. He underwent repeat abd/pelvic CT yesterday, that showed A drain is noted within the gallbladder fossa collection although the collection does not appear to have significantly improved in the interval. It again measures approximately 9.5 x 8.9 cm and demonstrates air within.  Medications:  . sodium chloride   Intravenous Once  . sodium chloride   Intravenous Once  . antiseptic oral rinse  7 mL Mouth Rinse BID  . darbepoetin (ARANESP) injection - NON-DIALYSIS  200 mcg Subcutaneous Q Wed-1800  . diphenhydrAMINE  12.5 mg Intravenous Once  . feeding supplement  1 Container Oral TID BM  . fluconazole  100 mg Oral Q24H  . insulin aspart  0-15 Units Subcutaneous 6 times per day  . insulin glargine  35 Units Subcutaneous QHS  . megestrol  40 mg Oral Daily  . nystatin   Topical TID  . ondansetron  8 mg Oral BID  . pantoprazole (PROTONIX) IV  40 mg Intravenous Q24H  . piperacillin-tazobactam  3.375 g Intravenous 4 times per day  . sodium chloride  10-40 mL Intracatheter Q12H    Objective: Vital signs in last 24 hours: Temp:   [97.6 F (36.4 C)-98.5 F (36.9 C)] 98 F (36.7 C) (08/23 1527) Pulse Rate:  [96-110] 103 (08/23 1527) Resp:  [20-39] 24 (08/23 1527) BP: (88-149)/(41-79) 119/48 mmHg (08/23 1527) SpO2:  [90 %-98 %] 97 % (08/23 1527) Weight:  [275 lb 2.2 oz (124.8 kg)] 275 lb 2.2 oz (124.8 kg) (08/23 0800)  Physical Exam  Constitutional: He is oriented to person, appears chronically ill.sleeping HENT: dry oral mucosa Mouth/Throat: Oropharynx is clear and moist. No oropharyngeal exudate.  Neck: left HD cath IJ Cardiovascular: Normal rate, regular rhythm and normal heart sounds. Exam reveals no gallop and no friction rub.  No murmur heard.  Pulmonary/Chest: Effort normal and breath sounds normal. No respiratory distress. He has no wheezes.  Abdominal: Soft. Decrease BS Ext: anasarca +1 edeam Skin: Skin is warm and dry. No rash noted. No erythema.     Lab Results  Recent Labs  02/07/15 0352 02/07/15 1642 02/08/15 0540  WBC 14.3*  --  17.4*  HGB 7.6*  --  6.8*  HCT 23.7*  --  21.7*  NA 134* 133* 131*  K 3.9 4.2 3.9  CL 98* 96* 94*  CO2 24 26 24   BUN 60* 65* 66*  CREATININE 5.25* 5.83* 6.42*   Liver Panel  Recent Labs  02/07/15 0352 02/07/15 1642 02/08/15 0540  PROT 6.5  --  6.6  ALBUMIN 2.4* 2.0* 2.1*  AST 20  --  58*  ALT 11*  --  19  ALKPHOS 99  --  131*  BILITOT 1.4*  --  1.0    Studies/Results: Ct Abdomen Pelvis Wo Contrast  02/07/2015   CLINICAL DATA:  Status post gallbladder fossa drain, check for resolution  EXAM: CT ABDOMEN AND PELVIS WITHOUT CONTRAST  TECHNIQUE: Multidetector CT imaging of the abdomen and pelvis was performed following the standard protocol without IV contrast.  COMPARISON:  02/03/2015  FINDINGS: The lung bases demonstrate bilateral pleural effusions right greater than left with associated right lower lobe consolidation. These changes are stable from the prior exam.  The liver is stable in appearance. A large perihepatic fluid collection is again  identified and stable. A drain is noted within the gallbladder fossa collection although the collection does not appear to have significantly improved in the interval. It again measures approximately 9.5 x 8.9 cm and demonstrates air within.  The spleen, adrenal glands, pancreas and kidneys are stable in appearance from the prior study.  Free fluid is again noted within the abdomen and pelvis. The bladder is decompressed by Foley catheter. Diverticular change of the colon is seen. Diffuse aortoiliac calcifications are noted. Postoperative changes consistent with a right hip replacement are seen. Degenerative changes of the lumbar spine are noted.  IMPRESSION: Stable perihepatic hematoma when compared with the prior exam.  Stable gallbladder fossa collection despite adequate drainage catheter placement.  Significant free fluid within the abdomen and pelvis slightly increased from the prior exam.  The remainder of the study is stable.   Electronically Signed   By: Alcide Clever M.D.   On: 02/07/2015 11:50   Ir Catheter Tube Change  02/08/2015   CLINICAL DATA:  Cholecystectomy, postop hematoma, possibly infected. Poor output from drain catheter.  EXAM: ABSCESS  CATHETER EXCHANGE UNDER FLUOROSCOPY  FLUOROSCOPY TIME:  1.3 minutes, 71 mgy  TECHNIQUE: The procedure, risks (including but not limited to bleeding, infection, organ damage ), benefits, and alternatives were explained to the patient. Questions regarding the procedure were encouraged and answered. The patient understands and consents to the procedure. The abscess drain catheter and surrounding skin were prepped with Betadine, draped in usual sterile fashion. Catheter was injected, partially opacifying the periphery of the known residual sub hepatic collection.  The catheter was cut and exchanged over a 0.035" angiographic wire for a new 14-French pigtail catheter, formed centrally within the collection system under fluoroscopy. Contrast injection confirms  appropriate positioning. Catheter secured externally with 0 Prolene suture and StatLock.  Catheter placed to gravity bag. The patient tolerated the procedure well.  COMPLICATIONS: COMPLICATIONS none  IMPRESSION:  1. Technically successful exchange and up sizing of a gallbladder fossa abscess drain catheter under fluoroscopy   Electronically Signed   By: Corlis Leak M.D.   On: 02/08/2015 12:29   Dg Chest Port 1 View  02/08/2015   CLINICAL DATA:  Perihepatic hematoma post cholecystectomy  EXAM: PORTABLE CHEST - 1 VIEW  COMPARISON:  02/05/2015 and earlier studies  FINDINGS: Left IJ hemodialysis catheter placed to the low SVC. Right arm PICC line to the low SVC. Low lung volumes. No pneumothorax. Perihilar atelectasis or infiltrates right greater than left, slightly increased. No definite effusion. Heart size upper limits normal for technique.  Spondylitic changes near the thoracolumbar junction.  IMPRESSION: 1. Left IJ Dialysis catheter and right arm PICC placement to the low SVC without pneumothorax.   Electronically Signed   By: Corlis Leak M.D.   On: 02/08/2015 12:31  Assessment/Plan: Intra-abdominal infection with infected hematoma post choelcystectomy = continue with piptazo for now. Await to see if will do TPA of drain in order to help faciliate drainage of fluid  aki = getting hemodialysis for now  Severe protein-caloric malnutrition = agree with peg, minimize time on TPN, thus reduce risk for fungemia    Drue Second [facility] Cell: [phone] Pager: [phone]  02/08/2015, 3:42 PM

## 2015-02-08 NOTE — Progress Notes (Signed)
   02/08/15 1100  Clinical Encounter Type  Visited With Family  Visit Type Initial;Critical Care;Spiritual support;Psychological support  Referral From Nurse  Consult/Referral To Nurse;Chaplain  Recommendations follow up for continued support around caregiving  Spiritual Encounters  Spiritual Needs Prayer;Emotional  Stress Factors  Family Stress Factors Major life changes;Exhausted    Consulted by nursing for family support.  Pt being placed on dialysis, 20 d. Admission.   Introduced spiritual care as resource.  Spouse and Sister receptive of chaplain support and follow up visit.   Chaplain Jerene Pitch and Shanon Ace met with Dymond's spouse and sister in ICU waiting area while Arvil was being placed on dialysis.  Provided support around process of illness, are hopeful that dialysis will be effective and expressed some exacerbation around feeling uncertain about next steps for a long time.  Expressed some fear around pt's "not knowing where he is" / delusions.   Chaplain provided empathic presence, prayers, planning around caregiving and supporting one another.    Will continue to follow during this admission  Please page as needs arise.     Jerene Pitch [place]

## 2015-02-08 NOTE — Progress Notes (Signed)
[facility] Progress Note   Subjective: somnolent, pain in RUQ and every where per family  Filed Vitals:   02/08/15 0400 02/08/15 0500 02/08/15 0600 02/08/15 0800  BP: 140/68 136/62 107/51   Pulse: 109 109 110   Temp:    97.6 F (36.4 C)  TempSrc:    Oral  Resp: 29 38 31   Height:      Weight:    124.8 kg (275 lb 2.2 oz)  SpO2: 97% 91% 94%    Exam: Pale, no distress, very lethargic No jvd Chest rales R base, L clear RRR no MRG Abd soft ntnd +BS, 2-3+ ascites, RUQ drain 2-3+ pitting edema bilat LE's from feet to hips/ scrotum Neuro no asterixis, disoriented but conversant  CXR 8/20 no acute changes UA renal 11-12 cm, no hydro, normal echo     Assessment: 1 AKI prolonged ATN - uremic, plan cath placement and CRRT to start today, have d/w CCM 2 Anemia to get prbc's today 3 RUQ abscess on AB + drain - CT 8/22 shows no improvement in gas/fluid collection RUQ since 8/18 study 4 Obesity 5 DM2  6 Nutrition onTNA now 7 Volume excess / anasarca/ ascites - follow CVP's , if he is intravasc low then prob still septic because he is diffusely swollen  Plan - CRRT, see orders, get vol down some if BP will tolerate    Vinson Moselle MD  pager [phone]    cell [phone]  02/08/2015, 8:10 AM     Recent Labs Lab 02/07/15 0352 02/07/15 1642 02/08/15 0540  NA 134* 133* 131*  K 3.9 4.2 3.9  CL 98* 96* 94*  CO2 GLUCOSE 79 133* 227*  BUN 60* 65* 66*  CREATININE 5.25* 5.83* 6.42*  CALCIUM 8.6* 8.4* 8.4*  PHOS 5.5* 5.7* 5.8*    Recent Labs Lab 02/06/15 0425 02/07/15 0352 02/07/15 1642 02/08/15 0540  AST 20 20  --  58*  ALT 13* 11*  --  19  ALKPHOS 87 99  --  131*  BILITOT 1.0 1.4*  --  1.0  PROT 6.5 6.5  --  6.6  ALBUMIN 2.2* 2.4* 2.0* 2.1*    Recent Labs Lab 02/05/15 0630 02/06/15 0425 02/07/15 0352 02/08/15 0540  WBC 13.3* 14.4* 14.3* 17.4*  NEUTROABS 11.5* 12.3* 12.1*  --   HGB 7.3* 7.5* 7.6* 6.8*  HCT 22.4* 23.0* 23.7* 21.7*   MCV 90.3 90.2 91.2 89.3  PLT 369 372 390 330   . sodium chloride   Intravenous Once  . antiseptic oral rinse  7 mL Mouth Rinse BID  . darbepoetin (ARANESP) injection - NON-DIALYSIS  200 mcg Subcutaneous Q Wed-1800  . diphenhydrAMINE  12.5 mg Intravenous Once  . feeding supplement  1 Container Oral TID BM  . fluconazole  100 mg Oral Q24H  . insulin aspart  0-15 Units Subcutaneous 6 times per day  . insulin glargine  35 Units Subcutaneous QHS  . megestrol  40 mg Oral Daily  . nystatin   Topical TID  . ondansetron  8 mg Oral BID  . piperacillin-tazobactam (ZOSYN)  IV  2.25 g Intravenous 3 times per day  . sodium chloride  10-40 mL Intracatheter Q12H   . sodium chloride 10 mL/hr at 01/31/15 0508  . TPN (CLINIMIX) Adult without lytes 40 mL/hr at 02/07/15 1802   And  . fat emulsion 120 mL (02/07/15 1855)  . heparin Stopped (02/08/15 0500)  . dialysis replacement fluid (prismasate)    .  dialysis replacement fluid (prismasate)    . dialysate (PRISMASATE)     acetaminophen, alteplase, fentaNYL (SUBLIMAZE) injection, heparin, heparin, iohexol, levalbuterol, LORazepam, ondansetron **OR** ondansetron (ZOFRAN) IV, oxyCODONE, sodium chloride
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
## 2015-02-08 NOTE — Progress Notes (Signed)
Patient ID: Larry [name], male   DOB: May 02, 1946, 69 y.o.   MRN: [id number]    Referring Physician(s): CCS  Chief Complaint: Gallbladder fossa abscess  Subjective:  Pt now on HD; a little lethargic but responds to some questions; wife in room; poor po intake; request now received for gastrostomy tube placement on pt  Allergies: Review of patient's allergies indicates no known allergies.  Medications: Prior to Admission medications   Medication Sig Start Date End Date Taking? Authorizing Provider  allopurinol (ZYLOPRIM) 300 MG tablet Take 300 mg by mouth daily.   Yes Historical Provider, MD  amLODipine (NORVASC) 10 MG tablet Take 10 mg by mouth every morning.    Yes Historical Provider, MD  apixaban (ELIQUIS) 5 MG TABS tablet Take 1 tablet (5 mg total) by mouth 2 (two) times daily. 10/01/14  Yes April Palumbo, MD  aspirin 81 MG tablet Take 81 mg by mouth daily.   Yes Historical Provider, MD  atorvastatin (LIPITOR) 80 MG tablet Take 80 mg by mouth at bedtime.    Yes Historical Provider, MD  Cholecalciferol (VITAMIN D PO) Take 2,000 Units by mouth daily.    Yes Historical Provider, MD  citalopram (CELEXA) 40 MG tablet Take 40 mg by mouth every morning.    Yes Historical Provider, MD  furosemide (LASIX) 40 MG tablet Take 40 mg by mouth every morning.    Yes Historical Provider, MD  glucose 4 GM chewable tablet Chew 1 tablet by mouth as needed for low blood sugar (only if BS IS BELOW 70).   Yes Historical Provider, MD  insulin glargine (LANTUS) 100 UNIT/ML injection Inject 40 Units into the skin at bedtime.   Yes Historical Provider, MD  magnesium oxide (MAG-OX) 400 MG tablet Take 400 mg by mouth 2 (two) times daily.    Yes Historical Provider, MD  metFORMIN (GLUCOPHAGE) 500 MG tablet Take 500 mg by mouth 2 (two) times daily with a meal.   Yes Historical Provider, MD  oxyCODONE-acetaminophen (PERCOCET/ROXICET) 5-325 MG per tablet Take 1-2 tablets by mouth every 4 (four) hours as needed for  moderate pain. 01/05/15  Yes [name], MD  pantoprazole (PROTONIX) 40 MG tablet Take 40 mg by mouth daily.   Yes Historical Provider, MD  vitamin B-12 (CYANOCOBALAMIN) 500 MCG tablet Take 500 mcg by mouth daily.   Yes Historical Provider, MD  Liraglutide 18 MG/3ML SOPN Inject 1.2 mg into the skin daily. Pt states has been on hold since discharged 10-06-14 hospital visit    Historical Provider, MD  lisinopril (PRINIVIL,ZESTRIL) 40 MG tablet Take 40 mg by mouth 2 (two) times daily. On hold since 10-06-14    Historical Provider, MD  ondansetron (ZOFRAN) 8 MG tablet Take 8 mg by mouth 2 (two) times daily. Not taking-on hold form 10-06-14    Historical Provider, MD     Vital Signs: BP 119/69 mmHg  Pulse 106  Temp(Src) 97.9 F (36.6 C) (Oral)  Resp 24  Ht  (1.676 m)  Wt 275 lb 2.2 oz (124.8 kg)  BMI 44.43 kg/m2  SpO2 96%  Physical Exam pt awake but still with some confusion; GB fossa drain intact- connected to gravity bag, output minimal amt bloody fluid;insertion site ok, mildly tender  Imaging: Ct Abdomen Pelvis Wo Contrast  02/07/2015   CLINICAL DATA:  Status post gallbladder fossa drain, check for resolution  EXAM: CT ABDOMEN AND PELVIS WITHOUT CONTRAST  TECHNIQUE: Multidetector CT imaging of the abdomen and pelvis was performed following the standard  protocol without IV contrast.  COMPARISON:  02/03/2015  FINDINGS: The lung bases demonstrate bilateral pleural effusions right greater than left with associated right lower lobe consolidation. These changes are stable from the prior exam.  The liver is stable in appearance. A large perihepatic fluid collection is again identified and stable. A drain is noted within the gallbladder fossa collection although the collection does not appear to have significantly improved in the interval. It again measures approximately 9.5 x 8.9 cm and demonstrates air within.  The spleen, adrenal glands, pancreas and kidneys are stable in appearance from the  prior study.  Free fluid is again noted within the abdomen and pelvis. The bladder is decompressed by Foley catheter. Diverticular change of the colon is seen. Diffuse aortoiliac calcifications are noted. Postoperative changes consistent with a right hip replacement are seen. Degenerative changes of the lumbar spine are noted.  IMPRESSION: Stable perihepatic hematoma when compared with the prior exam.  Stable gallbladder fossa collection despite adequate drainage catheter placement.  Significant free fluid within the abdomen and pelvis slightly increased from the prior exam.  The remainder of the study is stable.   Electronically Signed   By: Alcide Clever M.D.   On: 02/07/2015 11:50   Ir Catheter Tube Change  02/08/2015   CLINICAL DATA:  Cholecystectomy, postop hematoma, possibly infected. Poor output from drain catheter.  EXAM: ABSCESS  CATHETER EXCHANGE UNDER FLUOROSCOPY  FLUOROSCOPY TIME:  1.3 minutes, 71 mgy  TECHNIQUE: The procedure, risks (including but not limited to bleeding, infection, organ damage ), benefits, and alternatives were explained to the patient. Questions regarding the procedure were encouraged and answered. The patient understands and consents to the procedure. The abscess drain catheter and surrounding skin were prepped with Betadine, draped in usual sterile fashion. Catheter was injected, partially opacifying the periphery of the known residual sub hepatic collection.  The catheter was cut and exchanged over a 0.035" angiographic wire for a new 14-French pigtail catheter, formed centrally within the collection system under fluoroscopy. Contrast injection confirms appropriate positioning. Catheter secured externally with 0 Prolene suture and StatLock.  Catheter placed to gravity bag. The patient tolerated the procedure well.  COMPLICATIONS: COMPLICATIONS none  IMPRESSION:  1. Technically successful exchange and up sizing of a gallbladder fossa abscess drain catheter under fluoroscopy    Electronically Signed   By: Corlis Leak M.D.   On: 02/08/2015 12:29   US Renal  02/06/2015   CLINICAL DATA:  Patient with acute renal failure. Prior cholecystectomy.  EXAM: RENAL / URINARY TRACT ULTRASOUND COMPLETE  COMPARISON:  CT abdomen pelvis 02/03/2015  FINDINGS: Right Kidney:  Length: 11.0 cm. Echogenicity within normal limits. No mass or hydronephrosis visualized.  Left Kidney:  Length: 12.0 cm. Echogenicity within normal limits. No mass or hydronephrosis visualized.  Bladder:  Foley catheter.  Splenomegaly measuring 17.6 cm.  Ascites.  IMPRESSION: No hydronephrosis.  Splenomegaly.  Ascites.   Electronically Signed   By: Annia Belt M.D.   On: 02/06/2015 13:29   Dg Chest Port 1 View  02/08/2015   CLINICAL DATA:  Perihepatic hematoma post cholecystectomy  EXAM: PORTABLE CHEST - 1 VIEW  COMPARISON:  02/05/2015 and earlier studies  FINDINGS: Left IJ hemodialysis catheter placed to the low SVC. Right arm PICC line to the low SVC. Low lung volumes. No pneumothorax. Perihilar atelectasis or infiltrates right greater than left, slightly increased. No definite effusion. Heart size upper limits normal for technique.  Spondylitic changes near the thoracolumbar junction.  IMPRESSION: 1. Left  IJ Dialysis catheter and right arm PICC placement to the low SVC without pneumothorax.   Electronically Signed   By: Corlis Leak M.D.   On: 02/08/2015 12:31   Dg Chest Port 1 View  02/05/2015   CLINICAL DATA:  Recent nausea and vomiting and shortness of Breath  EXAM: PORTABLE CHEST - 1 VIEW  COMPARISON:  01/30/2015  FINDINGS: Cardiac shadow is enlarged but stable. A right jugular line is again seen and stable. Elevation the right hemidiaphragm is again noted. A right upper quadrant drainage catheter is again noted. No focal infiltrate or sizable effusion is seen.  IMPRESSION: No acute abnormality noted.   Electronically Signed   By: Alcide Clever M.D.   On: 02/05/2015 11:52    Labs:  CBC:  Recent Labs  02/05/15 0630  02/06/15 0425 02/07/15 0352 02/08/15 0540  WBC 13.3* 14.4* 14.3* 17.4*  HGB 7.3* 7.5* 7.6* 6.8*  HCT 22.4* 23.0* 23.7* 21.7*  PLT 369 372 390 330    COAGS:  Recent Labs  10/06/14 0445 01/19/15 1613 01/23/15 0754 02/08/15 0540  INR 1.07 1.46 1.32  --   APTT 34 35 38* 101*    BMP:  Recent Labs  02/06/15 0425 02/07/15 0352 02/07/15 1642 02/08/15 0540  NA 136 134* 133* 131*  K 3.6 3.9 4.2 3.9  CL 99* 98* 96* 94*  CO2 27 24 26 24   GLUCOSE 117* 79 133* 227*  BUN 60* 60* 65* 66*  CALCIUM 8.5* 8.6* 8.4* 8.4*  CREATININE 4.63* 5.25* 5.83* 6.42*  GFRNONAA 12* 10* 9* 8*  GFRAA 14* 12* 10* 9*    LIVER FUNCTION TESTS:  Recent Labs  01/30/15 0440 02/06/15 0425 02/07/15 0352 02/07/15 1642 02/08/15 0540  BILITOT 1.3* 1.0 1.4*  --  1.0  AST 16 20 20   --  58*  ALT 14* 13* 11*  --  19  ALKPHOS 65 87 99  --  131*  PROT 5.6* 6.5 6.5  --  6.6  ALBUMIN 2.1* 2.2* 2.4* 2.0* 2.1*    Assessment and Plan: Status post gallbladder fossa abscess drainage on 8/4, upsized 8/18; currently afebrile; hemoglobin early this morning 6.8, currently being transfused,  WBC 17.4 up slightly from 14.3; creatinine currently 6.42-previously 5.83; latest CT scan was reviewed by Dr. Fredia Sorrow. Gallbladder fossa abscess drain appears to be in satisfactory location. Due to recent drop in hemoglobin and initiation of hemodialysis today will hold off on TPA dwell of drain catheter for now. Request now received for percutaneous gastrostomy tube placement secondary to patient's poor oral intake and protein calorie malnutrition. Anatomically patient appears to be candidate for gastrostomy tube; details/risks of procedure briefly discussed with patient and wife; they would prefer to discuss matter again with Dr. [name] before deciding to proceed with tube placement. Will continue to monitor; should procedure be performed IV heparin will need to be stopped 2-4 hours before case. Ideally would also like to see a  declining trend in WBC and stable hgb before tube placement.    Signed: D. Jeananne Rama 02/08/2015, 2:14 PM   I spent a total of 15 minutes at the the patient's bedside AND on the patient's hospital floor or unit, greater than 50% of which was counseling/coordinating care for gallbladder fossa abscess drain

## 2015-02-08 NOTE — Progress Notes (Signed)
Mr. Blansett was awake when I arrived; his wife and sister were bedside. His wife told him I was there to pray for him. After prayer pt and family were appreciative for visit. Chaplain Marjory Lies Chaplain   02/08/15 1800  Clinical Encounter Type  Visited With Patient and family together

## 2015-02-08 NOTE — Progress Notes (Signed)
ANTICOAGULATION CONSULT NOTE - F/u Consult  Pharmacy Consult for Heparin  Indication: atrial fibrillation  Heparin level = 0.48 on 2400 units/hr which is within therapeutic range. No bleeding reported per RN.  Plan:  Continue heparin at current rate  F/u heparin level and CBC in AM  Loralee Pacas, PharmD, BCPS Pager: [phone] 02/08/2015 8:42 PM

## 2015-02-08 NOTE — Care Management Important Message (Signed)
Important Message  Patient Details  Name: Larry [name] MRN: [id number] Date of Birth: 07-May-1946   Medicare Important Message Given:  Yes-fourth notification given    Haskell Flirt 02/08/2015, 11:32 AMImportant Message  Patient Details  Name: Larry [name] MRN: [id number] Date of Birth: August 10, 1945   Medicare Important Message Given:  Yes-fourth notification given    Haskell Flirt 02/08/2015, 11:32 AM

## 2015-02-08 NOTE — Progress Notes (Signed)
Dr. Arlean Hopping notified of CRRT started at 1220 today.  Asked if he still wanted the renal panel for this afternoon.  Dr. Arlean Hopping said no renal panel for today.  Get a renal panel in the am on 02/09/2015.  ontinue to monitor the patient closely.  Riker Collier Debroah Loop RN

## 2015-02-08 NOTE — Progress Notes (Signed)
OT Cancellation Note  Patient Details Name: Larry [name] MRN: [id number] DOB: 08-28-1945   Cancelled Treatment:    Reason Eval/Treat Not Completed: Medical issues which prohibited therapy - Pt now on CRRT.  Will check back for appropriateness.   Angelene Giovanni Weingarten, OTR/L [phone]  02/08/2015, 3:24 PM

## 2015-02-08 NOTE — Progress Notes (Signed)
PT Cancellation Note  Patient Details Name: Larry [name] MRN: [id number] DOB: [birth year]   Cancelled Treatment:    Reason Eval/Treat Not Completed: Medical issues which prohibited therapy (hgb low, getting blood, starting CRRT today. followup tomorrow for activity allowed.)   Rada Hay 02/08/2015, 8:23 AM Blanchard Kelch PT [phone]

## 2015-02-08 NOTE — Procedures (Signed)
Hemodialysis Catheter Insertion Procedure Note Larry [name] [id number] 02-10-1946  Procedure: Insertion of Hemodialysis Catheter Indications: Hemodialysis  Procedure Details Consent: Risks of procedure as well as the alternatives and risks of each were explained to the (patient/caregiver).  Consent for procedure obtained.  Time Out: Verified patient identification, verified procedure, site/side was marked, verified correct patient position, special equipment/implants available, medications/allergies/relevent history reviewed, required imaging and test results available.  Performed  Maximum sterile technique was used including antiseptics, cap, gloves, gown, hand hygiene, mask and sheet.  Skin prep: Chlorhexidine; local anesthetic administered  A Trialysis HD catheter was placed in the left internal jugular vein using the Seldinger technique.  Evaluation Blood flow good Complications: No apparent complications Patient did tolerate procedure well. Chest X-ray ordered to verify placement.  CXR: pending.   Procedure performed under direct supervision of Dr. Kendrick Fries and with ultrasound guidance for real time vessel cannulation.     Canary Brim, NP-C [facility] Pgr: [phone] or [phone] 02/08/2015, 10:14 AM

## 2015-02-08 NOTE — Progress Notes (Signed)
Date:  February 08, 2015 U.R. performed for needs and level of care. Will continue to follow for Case Management needs.  Marcelle Smiling, RN, BSN, [place]   [phone]
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
## 2015-02-08 NOTE — Progress Notes (Addendum)
Nutrition Follow-up  DOCUMENTATION CODES:   Severe malnutrition in context of acute illness/injury, Morbid obesity  INTERVENTION:  - TPN per pharmacy - If gut function remains viable, recommend Panda with Nepro @ 50 mL/hr with 30 mL Prostat TID which will provide 2460 kcal, 142 grams protein, and 872 mL free water - If concern for emesis remains, recommend trial scheduled Reglan if medically feasible - RD will continue to monitor for needs   NUTRITION DIAGNOSIS:   Inadequate oral intake related to lethargy/confusion as evidenced by meal completion < 25%. -ongoing  GOAL:   Patient will meet greater than or equal to 90% of their needs -unmet  MONITOR:   Weight trends, Labs, I & O's, Other (Comment) (TPN regimen)  ASSESSMENT:   69 y/o admitted in 10/05/2012 with Sepsis, hypotension, acute renal failure and acute cholecystitis. He had been place on Eliquis for his AF by the [place]. He had an MI 2 years ago and did not follow up with his cardiologist. He was acutely ill and underwent a percutaneous drain placement on 10/06/14 by IR. He went home on 10/11/14. He was readmitted on 12/30/14 and underwent laparoscopic cholecystectomy with IOC. He was left with a drain in place and the site had "SNOW' placed in the GB fossa for hemostasis after the procedure. Drain was removed last week but there was some brusing at the site. He is transferred from [facility] with complaints of increased abdominal pain.  8/23 PICC placed yesterday and pt also transitioned to ICU status. Pt currently receiving Clinimix 5/15 @ 40 mL/hr which is providing 48 grams protein, 682 kcal. Pt received one bag of lipids which was hung prior to transition to ICU status.   Discussed pt with pharmacist. He indicates plan to hold IV lipids for 1 week due to ICU protocol and feels pt will likely not present with fatty acid deficiency as bag of lipids were given but that another bag may need to be given if labs indicate deficiency.  Pharmacist also states plan to add sliding scale insulin to TPN for glycemic control.  Catheter for CRRT placed less than 30 minutes ago and CRRT being initiated at this time. Nutrition needs have been re-calculated due to this change.  No indicator of inability to utilize the gut to feed pt other than prior episodes of emesis. If Reglan is medically feasible, recommend trail of scheduled Reglan, placement of Panda, and TF as outlined above which will meet 100% needs while limiting fluid.   Pt currently not meeting needs. RD will continue to monitor GOC and nutrition support regimen. Medications reviewed; Megace order in place since 8/19. Labs reviewed;  CBGs: 67-217 mg/dL, Na: 829 mmol/L, Cl: 94 mmol/L, BUN/creatinine elevated and continues to trend up, Ca: 8.4 mg/dL, Phos: 5.8 mg/dL, GFR: 8.  ADDENDUM: Nutrition needs based on standard body weight for pt who is morbidly obese.   8/22 - Follow-up for calorie count shows pt consumed 1/2 junior Whopper last night.  - Talked with wife who reports pt sleeps most of the day every day. - New consult for TPN. Talked with pharmacist who reports that discussion had by MD indicated that TF was discussed but then order for TPN initiation placed due to several instances of emesis. He also states that repeat CT to be done today.  - No prokinect agents ordered; recommend trial of scheduled Reglan to assess if this aids in transit time and resolves emesis with oral/enteral route of nutrition. - PICC not yet in place -  Concern for volume overload and refeeding with TPN. Pharmacist states plan to start TPN @ 50% goal rate initially.   8/19 - Pt ate 100% of dinner yesterday which consisted of diet Sprite, deli sandwich with mayo, deli ham, and lettuce, vanilla ice cream, and 8 oz whole milk (578 kcal, 21.5 grams protein). - RN reports pt had nothing for breakfast so far this AM but he did drink 1/2 Boost Breeze (145 kcal, 4.5 grams protein). - Will d/c Prostat  as RN states pt has had several instances of refusal of this supplement and will order vanilla Magic Cup BID as pt seems to like vanilla ice cream.  8/15 - Pt's diet advanced to Soft on 8/10 but pt has only been eating 5-10% on average.  - Pt reports abdominal pain which is consistent and not exacerbated by intakes.  - Family member asks about good options on Soft diet.  - Will order supplements; feel pt will do better with a clear liquid supplement at this time and will order Prostat to increase protein. - If poor intakes persist, recommend Panda tube with Vital AF 1.2 @ 60 mL/hr to provide 1728 kcal (97% minimal needs), 108 grams protein, and 1168 mL free water  8/9 -Pt admitted 8/3 and on CLD or NPO since that date (6 days).  - Even on CLD pt has consumed very little and is unable to meet nutritional needs.  - CCM notes indicate oliguric renal failure and Hgb stable; pt previously with blood in abdomen. - Recommend Panda tube with TF as outlined above if possible. If unable to feed pt enterally, recommend TPN.   8/4 - Pt has had a poor appetite for 1 month PTA and las good meal was 8/2 - N/V with unknown start date and complaining of 10/10 pain recently   Diet Order:  DIET SOFT Room service appropriate?: Yes; Fluid consistency:: Thin TPN (CLINIMIX) Adult without lytes TPN (CLINIMIX) Adult without lytes  Skin:  Wound (see comment) (abdominal incision)  Last BM:  8/19  Height:   Ht Readings from Last 1 Encounters:  01/20/15 5\' 6"  (1.676 m)    Weight:   Wt Readings from Last 1 Encounters:  02/08/15 275 lb 2.2 oz (124.8 kg)    Ideal Body Weight:  64.54 kg (kg)  BMI:  Body mass index is 44.43 kg/(m^2).  Estimated Nutritional Needs:   Kcal:  2400-2800  Protein:  120-160 grams  Fluid:  1-1.3 L/day  EDUCATION NEEDS:   No education needs identified at this time     Trenton Gammon, RD, LDN Inpatient Clinical Dietitian Pager # [phone] After hours/weekend  pager # [phone]

## 2015-02-08 NOTE — Progress Notes (Signed)
CSW assisting with d/c planning. D/c date is undetermined at this time. SNF placement will most likely be needed at d/c. CSW will continue to follow.  Cori Razor LCSW [phone]

## 2015-02-08 NOTE — Progress Notes (Signed)
Patient ID: Larry [name], male   DOB: 01-Aug-1945, 69 y.o.   MRN: [id number] [facility] PROGRESS NOTE  Larry [name] WGN:[id number] DOB: 03/24/1946 DOA: 01/19/2015 PCP: [name]  Brief narrative:    69 year old male with past medical history of atrial fibrillation (on anticoagulation with Eliquis), hypertension, dyslipidemia, diabetes. Patient underwent laparoscopic cholecystectomy on 01/03/2015. He subsequently developed infected gallbladder fossa hematoma and has underwent drain placement (cultures from the fluid analysis yielded mixed organisms). Patient presented to [facility] 01/19/2015 with septic shock and was then transferred to [facility] for management of infected hematoma. He has required pressor support on admission but has been off pressors since 01/21/2015. He has also required a total of 2 units of PRBC transfusion because of increasing intraperitoneal hemorrhage on 01/23/2015.  Patient's hospital course has been complicated with the following issues: 1. Ongoing pain at the drain site for which reason patient had a repeat CT abdomen 02/03/2015 which demonstrated 9.8 cm hematoma with gas in the gallbladder fossa with indwelling pigtail drainage catheter, mildly decreased, 12.6 cm hematoma along the posterior right liver, mildly increased, moderate abdominopelvic ascites. His drain was changed to larger size by IR 02/04/2015. CT again repeated 02/07/2015 and demonstrated stable perihepatic hematoma when compared with the prior exam.  Gallbladder fossa collection seemed stable despite adequate drainage catheter placement. There was significant free fluid within the abdomen and pelvis slightly increased from the prior exam but the remainder of the study was essentially stable.    2. Worsening renal insufficiency - creatinine has worsened over past few days. Patient was on high dose of lasix for anasarca / volume overload tthought to be from IV fluid resuscitation during the initial phases of septic  shock. Lasix was stopped 02/05/2015 per nephrology recommendations. Renal recommends dialysis which is likely to start 02/08/2015 in [facility].   TRH as primary starting 02/08/2015.   Barrier to discharge: Worsening renal function warrants dialysis. Hemodialysis catheter was placed by critical care on 02/08/2015 and per nephrology plan is for dialysis today in [facility].   Assessment/Plan:    Principal Problem: Septic shock secondary to complicated cholecystitis status post cholecystectomy 01/03/2015 with infected hematoma / Leukocytosis - Septic shock present on admission thought to be secondary to infected gallbladder fossa hematoma status post recent cholecystectomy and drain placement  - As noted above, patient initially with complaints of worsening pain at the drain site. CT abdomen was repeated on 02/03/2015 and demonstrated 9.8 cm hematoma with gas in the gallbladder fossa with indwelling pigtail drainage catheter, mildly decreased, 12.6 cm hematoma along the posterior right liver, mildly increased. Patient underwent upsize of percutaneous drain 8/18 by IR. - CT abdomen again repeated 02/07/2015 and gallbladder fossa collection seems relatively stable compared with prior study. There is however significant free fluid within the abdomen and pelvis increased from prior study. - No plan for surgical intervention at this time, surgery will continue to monitor his clinical course. We will continue Zosyn per infection disease recommendations  - Leukocytosis slightly worse this morning, WBC count trended up from 14.3 to 17.4 but patient remains afebrile.  - Due to complexity of medical issues we will continue to monitor patient in step down unit.  Active Problems: Acute renal failure / acute tubular necrosis - Worsening renal function, likely acute tubular necrosis associated with sepsis during the initial phase of hospitalization as well as diuresis with lasix - Patient was on  high-dose Lasix 80 mg IV every 8 hours but this was  stopped 02/05/2015 per nephrology because of worsening renal failure - Appreciate very much nephrology following. Recommendation is for hemodialysis catheter placement and hopefully dialysis today in [facility]. Patient is medically unstable for transfer to [facility] at this time. - Patient had a renal ultrasound done 02/07/2015 and he did not show hydronephrosis.  Yeast UTI / catheter related urinary tract infection  - Continue fluconazole per infectious disease recommendations through 02/09/2015   Acute hypoxic respiratory failure secondary to volume overload and anasarca - Likely from initial fluid resuscitation for septic shock - Patient was on IV Lasix 80 mg IV every 8 hours which was transitioned to oral Lasix 02/04/2015. Because of worsening renal function Lasix stopped 02/05/2015. - Respiratory status is stable. - Chest x-ray done 02/05/2015 showed no acute abnormalities. - Weight over past 72 hours: 122.3 kg --> 119.4 kg --> 124.8 kg - Continue daily weight and strict intake and output  Hypokalemia/hypomagnesemia - Secondary to lasix - Supplemented   Anemia of chronic disease / Acute blood loss anemia - Due to anticoagulation and hematoma - S/P 2 units of PRBC (on 01/23/2015) - Aranesp given every Wednesday (last dose 8/17) - Feraheme IV Q 72 hours (last given 8/17) - Hemoglobin down to 6.8 this am.  - Order placed for 1 unit PRBC transfusion   History of atrial fibrillation - CHADS vasc score at least 3 - On eliquis at home but now on IV heparin. Because of worsening renal insufficiency patient is no longer candidate for Eliquis and once he is stable for discharge he will be likely transitioned to coumadin.  - Rate controlled   Diabetes mellitus type 2, uncontrolled - A1c 8.2 indicating poor glycemic control - Continue Lantus 35 units at bedtime and SSI - CBG's in past 24 hours: 216, 217, 213  Morbid obesity /  severe protein calorie malnutrition - Body mass index is 45.35 kg/(m^2). - Patient with very minimal by mouth intake likely in the setting of acute illness - Start TPN today per surgery recommendations - Continue nutritional supplementation per dietician recommendations  Functional quadriplegia - In the setting of prolonged illness and hospitalization    DVT Prophylaxis  - IV heparin     Code Status: Full.  Family Communication:  Updated patient's wife at the bedside.  Disposition Plan: remains in SDU due to complexity of medical issues including but not limited to worsening renal failure and mental status.  IV access:  Hemodyalysis catheter placed 02/08/2015 PICC placed 02/07/2015   Procedures and diagnostic studies:    CT's Ct Abdomen Pelvis Wo Contrast 02/07/2015    Stable perihepatic hematoma when compared with the prior exam.  Stable gallbladder fossa collection despite adequate drainage catheter placement.  Significant free fluid within the abdomen and pelvis slightly increased from the prior exam.  The remainder of the study is stable.   Electronically Signed   By: Alcide Clever M.D.   On: 02/07/2015 11:50    Ct Abdomen Pelvis Wo Contrast 02/03/2015 9.8 cm hematoma with gas in the gallbladder fossa, with indwelling pigtail drainage catheter, mildly decreased. 12.6 cm hematoma along the posterior right liver, mildly increased. Moderate abdominopelvic ascites, stable versus mildly increased. Mild wall thickening involving the right colon, possibly secondary to ascites and right upper quadrant inflammation. Electronically Signed By: Charline Bills M.D. On: 02/03/2015 11:22   Ct Abdomen Pelvis Wo Contrast 01/28/2015 The gallbladder fossa hematoma/abscess has not significantly changed in size. Stable position of the percutaneous drainage catheter. Stable appearance of the  large perihepatic or subcapsular liver hematoma. Additional hematomas along the right anterior abdomen  are stable. No significant change in the amount of ascites. Again noted is a small amount of perisplenic fluid. Bilateral pleural effusions, right side greater the left. Stable volume loss and consolidation in the right lower lobe. Electronically Signed By: Richarda Overlie M.D. On: 01/28/2015 07:23   Ct Abdomen Pelvis Wo Contrast 01/22/2015 Post percutaneous drainage 's of a gallbladder fossa abscess collection, collection now larger and higher in attenuation than on the previous study question interval hemorrhage. Additionally, increased flow void with high density layering noted perihepatic compatible with increased intraperitoneal hemorrhage. Increased ascites. Mild increase in RIGHT pleural effusion and basilar atelectasis. Findings called to Dr. Gerrit Friends on 01/22/2015 at 1704 hr. Electronically Signed By: Ulyses Southward M.D. On: 01/22/2015 17:06   Ct Abdomen Pelvis W Contrast 01/13/2015 1. Gas and fluid at the cholecystectomy site following Reese removal of the drainage catheter is likely within normal limits. No discrete abscess or organized fluid collection is present. Follow-up CT scan in 1-2 weeks or nuclear medicine hepatobiliary scan could be used for further evaluation if the patient's symptoms persist 2. Minimal ascites about the liver is likely reactive. No significant focal attenuation differences are evident adjacent to the cholecystectomy site. 3. Extensive atherosclerotic changes including coronary artery disease. 4. Small right pleural effusion and associated atelectasis. 5. Degenerative change within the thoracolumbar spine as described.   CXR's Dg Chest Port 1 View 02/05/2015 No acute abnormality noted.   Dg Chest Port 1 View 01/30/2015 Right IJ central line with tip in right atrium again noted. No pulmonary edema. There is chronic elevation of the right hemidiaphragm with right basilar atelectasis.  Dg Chest Port 1 View 01/21/2015 Persistent RIGHT basilar atelectasis.    Dg Chest Port 1 View 01/20/2015 1. New right IJ central line with tip at the right atrial level. No pneumothorax. 2. Low lung volumes with bibasilar atelectasis or pneumonia.   Dg Chest Port 1 View 01/19/2015 Mild opacification in the right perihilar region and medial right base which may be due to atelectasis or infection.   Ultrasound US Renal 02/06/2015    No hydronephrosis.  Splenomegaly.  Ascites.     Procedures: Ct Image Guided Drainage Percut Cath Peritoneal Retroperit 01/20/2015 Successful CT-guided gallbladder fossa abscess drain insertion.   Medical Consultants:  Surgery Nephrology - Dr. Delano Metz  Infectious disease Interventional radiology   Other Consultants:  Diabetic coordinator Nutrition  PT/OT  IAnti-Infectives:   Diflucan 02/02/2015 -->  Zosyn 01/19/2015 -->    Manson Passey, MD  [facility] Pager [phone]  Time spent in minutes: 25 minutes  If 7PM-7AM, please contact night-coverage [email or web address] Password [email or web address] 02/08/2015, 10:32 AM   LOS: 20 days    HPI/Subjective: No acute overnight events. Patient still confused this am.   Objective: Filed Vitals:   02/08/15 0400 02/08/15 0500 02/08/15 0600 02/08/15 0800  BP: 140/68 136/62 107/51 130/67  Pulse: 109 109 110 108  Temp:    97.6 F (36.4 C)  TempSrc:    Oral  Resp: 29 38 31 29  Height:      Weight:    124.8 kg (275 lb 2.2 oz)  SpO2: 97% 91% 94% 95%    Intake/Output Summary (Last 24 hours) at 02/08/15 1032 Last data filed at 02/08/15 0800  Gross per 24 hour  Intake 1326.09 ml  Output    212 ml  Net 1114.09 ml    Exam:   General:  Pt appears  ill, no distress, ashy color of the face, confused, delirious   Cardiovascular: tachycardic, S1/S2 appreciated   Respiratory: No wheezing, no crackles, no rhonchi  Abdomen: drain in place in right abdomen, distended but not tender abdomen, (+) BS   Extremities: +2 LE pitting edema, pulses palpable bilaterally  Neuro:  Nonfocal  Data Reviewed: Basic Metabolic Panel:  Recent Labs Lab 02/04/15 0240 02/05/15 0630 02/06/15 0425 02/07/15 0352 02/07/15 1642 02/08/15 0540  NA 136 136 136 134* 133* 131*  K 3.5 3.3* 3.6 3.9 4.2 3.9  CL 99* 99* 99* 98* 96* 94*  CO2 28 29 27 24 26 24   GLUCOSE 147* 115* 117* 79 133* 227*  BUN 50* 48* 60* 60* 65* 66*  CREATININE 3.63* 3.95* 4.63* 5.25* 5.83* 6.42*  CALCIUM 8.6* 8.4* 8.5* 8.6* 8.4* 8.4*  MG 1.5* 1.5* 2.0 2.1  --  2.0  PHOS 3.8 4.9* 5.5* 5.5* 5.7* 5.8*   Liver Function Tests:  Recent Labs Lab 02/06/15 0425 02/07/15 0352 02/07/15 1642 02/08/15 0540  AST 20 20  --  58*  ALT 13* 11*  --  19  ALKPHOS 87 99  --  131*  BILITOT 1.0 1.4*  --  1.0  PROT 6.5 6.5  --  6.6  ALBUMIN 2.2* 2.4* 2.0* 2.1*   No results for input(s): LIPASE, AMYLASE in the last 168 hours. No results for input(s): AMMONIA in the last 168 hours. CBC:  Recent Labs Lab 02/03/15 0359 02/04/15 0240 02/05/15 0630 02/06/15 0425 02/07/15 0352 02/08/15 0540  WBC 13.3* 14.5* 13.3* 14.4* 14.3* 17.4*  NEUTROABS 11.1* 12.7* 11.5* 12.3* 12.1*  --   HGB 8.1* 7.8* 7.3* 7.5* 7.6* 6.8*  HCT 24.9* 23.9* 22.4* 23.0* 23.7* 21.7*  MCV 88.9 89.2 90.3 90.2 91.2 89.3  PLT 398 360 369 372 390 330   Cardiac Enzymes: No results for input(s): CKTOTAL, CKMB, CKMBINDEX, TROPONINI in the last 168 hours. BNP: Invalid input(s): POCBNP CBG:  Recent Labs Lab 02/07/15 1552 02/07/15 1944 02/07/15 2312 02/08/15 0413 02/08/15 0746  GLUCAP 130* 191* 216* 217* 213*    Recent Results (from the past 240 hour(s))  Urine culture     Status: None   Collection Time: 01/30/15 11:40 AM  Result Value Ref Range Status   Specimen Description URINE, RANDOM  Final   Special Requests NONE  Final   Culture   Final    >=100,000 COLONIES/mL YEAST Performed at [facility]    Report Status 02/01/2015 FINAL  Final     Scheduled Meds: . sodium chloride   Intravenous Once  . antiseptic oral rinse   7 mL Mouth Rinse BID  . darbepoetin (ARANESP) injection - NON-DIALYSIS  200 mcg Subcutaneous Q Wed-1800  . diphenhydrAMINE  12.5 mg Intravenous Once  . feeding supplement  1 Container Oral TID BM  . fluconazole  100 mg Oral Q24H  . insulin aspart  0-15 Units Subcutaneous 6 times per day  . insulin glargine  35 Units Subcutaneous QHS  . megestrol  40 mg Oral Daily  . nystatin   Topical TID  . ondansetron  8 mg Oral BID  . piperacillin-tazobactam (ZOSYN)  IV  2.25 g Intravenous 3 times per day  . sodium chloride  10-40 mL Intracatheter Q12H   Continuous Infusions: . sodium chloride 10 mL/hr at 01/31/15 0508  . TPN (CLINIMIX) Adult without lytes 40 mL/hr at 02/07/15 1802   And  . fat emulsion 120 mL (02/07/15 1855)  . heparin Stopped (02/08/15 0500)  .  dialysis replacement fluid (prismasate)    . dialysis replacement fluid (prismasate)    . dialysate (PRISMASATE)    . TPN (CLINIMIX) Adult without lytes
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
## 2015-02-09 ENCOUNTER — Inpatient Hospital Stay (HOSPITAL_COMMUNITY): Payer: Medicare Other

## 2015-02-09 DIAGNOSIS — Z515 Encounter for palliative care: Secondary | ICD-10-CM | POA: Insufficient documentation

## 2015-02-09 DIAGNOSIS — D62 Acute posthemorrhagic anemia: Secondary | ICD-10-CM

## 2015-02-09 LAB — BODY FLUID CELL COUNT WITH DIFFERENTIAL
Lymphs, Fluid: 3 %
Monocyte-Macrophage-Serous Fluid: 17 % — ABNORMAL LOW (ref 50–90)
Neutrophil Count, Fluid: 80 % — ABNORMAL HIGH (ref 0–25)
WBC FLUID: 842 uL (ref 0–1000)

## 2015-02-09 LAB — RENAL FUNCTION PANEL
ALBUMIN: 2.3 g/dL — AB (ref 3.5–5.0)
ALBUMIN: 2.2 g/dL — AB (ref 3.5–5.0)
ANION GAP: 10 (ref 5–15)
Anion gap: 9 (ref 5–15)
BUN: 44 mg/dL — AB (ref 6–20)
BUN: 34 mg/dL — ABNORMAL HIGH (ref 6–20)
CALCIUM: 8.2 mg/dL — AB (ref 8.9–10.3)
CALCIUM: 8.2 mg/dL — AB (ref 8.9–10.3)
CO2: 26 mmol/L (ref 22–32)
CO2: 26 mmol/L (ref 22–32)
CREATININE: 4.26 mg/dL — AB (ref 0.61–1.24)
Chloride: 97 mmol/L — ABNORMAL LOW (ref 101–111)
Chloride: 97 mmol/L — ABNORMAL LOW (ref 101–111)
Creatinine, Ser: 3.4 mg/dL — ABNORMAL HIGH (ref 0.61–1.24)
GFR calc Af Amer: 15 mL/min — ABNORMAL LOW (ref >60)
GFR calc non Af Amer: 13 mL/min — ABNORMAL LOW (ref >60)
GFR, EST AFRICAN AMERICAN: 20 mL/min — AB (ref >60)
GFR, EST NON AFRICAN AMERICAN: 17 mL/min — AB (ref >60)
GLUCOSE: 156 mg/dL — AB (ref 65–99)
Glucose, Bld: 142 mg/dL — ABNORMAL HIGH (ref 65–99)
PHOSPHORUS: 3.4 mg/dL (ref 2.5–4.6)
PHOSPHORUS: 2.7 mg/dL (ref 2.5–4.6)
POTASSIUM: 4.1 mmol/L (ref 3.5–5.1)
Potassium: 4 mmol/L (ref 3.5–5.1)
SODIUM: 132 mmol/L — AB (ref 135–145)
Sodium: 133 mmol/L — ABNORMAL LOW (ref 135–145)

## 2015-02-09 LAB — CBC
HCT: 24.1 % — ABNORMAL LOW (ref 39.0–52.0)
Hemoglobin: 7.7 g/dL — ABNORMAL LOW (ref 13.0–17.0)
MCH: 28.3 pg (ref 26.0–34.0)
MCHC: 32 g/dL (ref 30.0–36.0)
MCV: 88.6 fL (ref 78.0–100.0)
Platelets: 344 10*3/uL (ref 150–400)
RBC: 2.72 MIL/uL — ABNORMAL LOW (ref 4.22–5.81)
RDW: 17.1 % — AB (ref 11.5–15.5)
WBC: 21.2 10*3/uL — AB (ref 4.0–10.5)

## 2015-02-09 LAB — HEPARIN LEVEL (UNFRACTIONATED): HEPARIN UNFRACTIONATED: 0.54 [IU]/mL (ref 0.30–0.70)

## 2015-02-09 LAB — GRAM STAIN

## 2015-02-09 LAB — GLUCOSE, CAPILLARY
GLUCOSE-CAPILLARY: 153 mg/dL — AB (ref 65–99)
GLUCOSE-CAPILLARY: 131 mg/dL — AB (ref 65–99)
GLUCOSE-CAPILLARY: 149 mg/dL — AB (ref 65–99)
Glucose-Capillary: 117 mg/dL — ABNORMAL HIGH (ref 65–99)
Glucose-Capillary: 140 mg/dL — ABNORMAL HIGH (ref 65–99)

## 2015-02-09 LAB — PHOSPHORUS: PHOSPHORUS: 3.5 mg/dL (ref 2.5–4.6)

## 2015-02-09 LAB — MAGNESIUM: MAGNESIUM: 2.1 mg/dL (ref 1.7–2.4)

## 2015-02-09 LAB — APTT: aPTT: 157 seconds — ABNORMAL HIGH (ref 24–37)

## 2015-02-09 MED ORDER — SODIUM CHLORIDE 0.9 % IV SOLN
500.0000 mg | Freq: Three times a day (TID) | INTRAVENOUS | Status: DC
  Administered 2015-02-10 – 2015-02-12 (×8): 500 mg via INTRAVENOUS
  Filled 2015-02-09 (×9): qty 0.5

## 2015-02-09 MED ORDER — ACETAMINOPHEN 650 MG RE SUPP: 650.0000 mg | RECTAL | Status: DC | PRN

## 2015-02-09 MED ORDER — SODIUM CHLORIDE 0.9 % IV SOLN: 500.0000 mg | Freq: Four times a day (QID) | INTRAVENOUS | Status: DC

## 2015-02-09 MED ORDER — FENTANYL CITRATE (PF) 100 MCG/2ML IJ SOLN
25.0000 ug | INTRAMUSCULAR | Status: DC | PRN
Start: 2015-02-09 — End: 2015-02-14
  Administered 2015-02-09 – 2015-02-12 (×15): 50 ug via INTRAVENOUS
  Filled 2015-02-09 (×13): qty 2

## 2015-02-09 MED ORDER — MEROPENEM 1 G IV SOLR
1.0000 g | Freq: Once | INTRAVENOUS | Status: AC
  Administered 2015-02-09: 1 g via INTRAVENOUS
  Filled 2015-02-09: qty 1

## 2015-02-09 MED ORDER — TRACE MINERALS CR-CU-MN-SE-ZN 10-1000-500-60 MCG/ML IV SOLN
INTRAVENOUS | Status: AC
  Administered 2015-02-09: 17:00:00 via INTRAVENOUS
  Filled 2015-02-09: qty 1800
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
## 2015-02-09 NOTE — Procedures (Signed)
   US guided bedside para RLQ 150 cc bloody bile  Aspirated all I could Loculated and difficult to aspirate  Sent for labs

## 2015-02-09 NOTE — Progress Notes (Signed)
PROGRESS NOTE    Larry [name] ZOX:[id number] DOB: 18-Nov-1945 DOA: 01/19/2015 PCP: [name]  HPI/Brief narrative 69 year old male with PMH of A. fib on eliquis, HTN, HLD, 2, initially hospitalized 10/05/14 with sepsis due to acute cholecystitis, PC drain by IR on 10/06/14, DC'ed home on 10/11/2014. He was readmitted 12/30/2014 and underwent Lap cholecystectomy with IOC on 12/30/14. He presented to [facility] on 01/19/15 with abdominal pain and was transferred and admitted to [facility] by general surgery for septic shock related to infected hematoma. Off pressors since 01/21/15. Has RUQ drain in place and on IV Zosyn. IR, CCS and ID continued to follow. Drain upsized 02/04/15. Status post 3 units PRBC for suspected slow intraperitoneal hemorrhage from cholecystectomy site. Developed acute renal failure, nephrology consulted and started CRRT on 02/08/15. Primary care transferred to [facility] starting 02/08/15. Currently issues with delirium, worsening leukocytosis, concern for an ongoing slow intraperitoneal hemorrhage. Palliative care consulted 8/24 for goals of care.   Assessment/Plan:  Septic shock secondary to infected gallbladder fossa hematoma/abscess, at recent lap cholecystectomy site - Off pressors since 01/21/15. - Serial imaging studies as below. - General surgery, infectious disease and interventional radiology continue to follow - Discussed with Dr. [name], CCS on 8/24. Concern regarding worsening leukocytosis. Discussed with Dr. Nolon Rod see and will follow recommendations. IR consulted for therapeutic and diagnostic paracentesis-fluid to be sent for cells, differential and cultures. - RUQ drain placed and upsized 02/04/15. IR reluctant to TPA drain due to concern for ongoing slow bleeding.  Acute renal failure with uremia - Secondary to prolonged ATN, sepsis - Nephrology consulted and started CRRT on 8/23  Anemia of chronic disease/acute blood loss anemia. -  Secondary  to slow Intraperitoneal bleeding and critical illness - Status post 3 units PRBCs thus far. Follow CBCs and transfuse if hemoglobin less than 7 g per DL. - Continue Aranesp & Fereheme  Yeast UTI/CAUTI - Completed a week of fluconazole on 8/24-DC  Acute respiratory failure with hypoxia - Possibly from volume overload, pulmonary edema related to aggressive IV fluid resuscitation for septic shock. - Improving.  Anasarca - Related to hypoalbuminemia, IV fluid resuscitation and renal failure. - Volume management across dialysis.  Delirium/uremia - Altered mental status most likely secondary to ongoing acute infection, uremia. Not sure if patient has underlying dementia.  Paroxysmal Atrial fibrillation - On eliquis at home. Currently on IV heparin infusion which may have to be held if he has persistent drop in his hemoglobin related to intraperitoneal hemorrhage. - Controlled ventricular rate.  DM 2, uncontrolled - A1c 8.2. - Continue Lantus and SSI. Reasonable inpatient control.  Body mass index is 44.39 kg/(m^2)./severe protein calorie malnutrition - Issues with nausea, poor oral intake. Patient currently on TNA. Plans to place PEG tube-held for now.  Functional quadriplegia - Secondary to acute illness and prolonged hospitalization. Will need SNF at discharge.  Hypokalemia/hypomagnesemia - Replace as needed.  Essential hypertension - Controlled   DVT prophylaxis: On IV heparin anticoagulation currently  Code Status: Full Family Communication: none at bedside Disposition Plan: not medically stable for discharge anytime soon. Remains in stepdown unit.   Consultants:  General surgery  Interventional radiology  Infectious disease  Nephrology  Palliative care medicine  Procedures:  RUQ drain  RUE PICC line  Foley catheter  HD catheter placement in left IJ on 8/23  Antibiotics:  Fluconazole 8/15-8/23  IV Zosyn 8/3 >  IV vancomycin 8/3 >  8/6  Subjective:  patient confused and restless.  Only oriented to self. Unable to provide any history. As per nursing, confused and attempting to pull out lines.  Objective: Filed Vitals:   02/09/15 0900 02/09/15 1000 02/09/15 1100 02/09/15 1200  BP: 140/55 139/78 135/79   Pulse: 101 104 100   Temp:    98.1 F (36.7 C)  TempSrc:    Axillary  Resp: 22 27 29    Height:      Weight:      SpO2: 95% 95% 94%     Intake/Output Summary (Last 24 hours) at 02/09/15 1231 Last data filed at 02/09/15 1200  Gross per 24 hour  Intake   2201 ml  Output   3445 ml  Net  -1244 ml   Filed Weights   02/04/15 1800 02/08/15 0800 02/09/15 0500  Weight: 119.4 kg (263 lb 3.7 oz) 124.8 kg (275 lb 2.2 oz) 124.7 kg (274 lb 14.6 oz)     Exam:  General exam: moderately built, obese, ill looking middle-aged male lying propped up in bed. Fidgety and restless. Attempts to pull out multiple lines/tubes. Left neck IJ HD catheter site intact  Respiratory system: reduced breath sounds in the bases with occasional basal crackles but otherwise clear to auscultation. No increased work of breathing. Cardiovascular system: S1 & S2 heard, RRR. No JVD, murmurs, gallops, clicks.2+ bilateral leg edema/anasarca.  Gastrointestinal system: Abdomen is nondistended, soft and nontender. Normal bowel sounds heard. RUQ drain intact. Foley catheter in place.  Central nervous system: somnolent but easily arousable and oriented only to self. No focal neurological deficits. Extremities: Symmetric 5 x 5 power.RUE PICC line.    Data Reviewed: Basic Metabolic Panel:  Recent Labs Lab 02/05/15 0630 02/06/15 0425 02/07/15 0352 02/07/15 1642 02/08/15 0540 02/09/15 0400  NA 136 136 134* 133* 131* 132*  K 3.3* 3.6 3.9 4.2 3.9 4.0  CL 99* 99* 98* 96* 94* 97*  CO2 29 27 24 26 24 26   GLUCOSE 115* 117* 79 133* 227* 156*  BUN 48* 60* 60* 65* 66* 44*  CREATININE 3.95* 4.63* 5.25* 5.83* 6.42* 4.26*  CALCIUM 8.4* 8.5* 8.6* 8.4* 8.4*  8.2*  MG 1.5* 2.0 2.1  --  2.0 2.1  PHOS 4.9* 5.5* 5.5* 5.7* 5.8* 3.4  3.5   Liver Function Tests:  Recent Labs Lab 02/06/15 0425 02/07/15 0352 02/07/15 1642 02/08/15 0540 02/09/15 0400  AST 20 20  --  58*  --   ALT 13* 11*  --  19  --   ALKPHOS 87 99  --  131*  --   BILITOT 1.0 1.4*  --  1.0  --   PROT 6.5 6.5  --  6.6  --   ALBUMIN 2.2* 2.4* 2.0* 2.1* 2.3*   No results for input(s): LIPASE, AMYLASE in the last 168 hours. No results for input(s): AMMONIA in the last 168 hours. CBC:  Recent Labs Lab 02/03/15 0359 02/04/15 0240 02/05/15 0630 02/06/15 0425 02/07/15 0352 02/08/15 0540 02/09/15 0400  WBC 13.3* 14.5* 13.3* 14.4* 14.3* 17.4* 21.2*  NEUTROABS 11.1* 12.7* 11.5* 12.3* 12.1*  --   --   HGB 8.1* 7.8* 7.3* 7.5* 7.6* 6.8* 7.7*  HCT 24.9* 23.9* 22.4* 23.0* 23.7* 21.7* 24.1*  MCV 88.9 89.2 90.3 90.2 91.2 89.3 88.6  PLT 398 360 369 372 390 330 344   Cardiac Enzymes: No results for input(s): CKTOTAL, CKMB, CKMBINDEX, TROPONINI in the last 168 hours. BNP (last 3 results) No results for input(s): PROBNP in the last 8760 hours. CBG:  Recent Labs Lab  02/08/15 2009 02/08/15 2334 02/09/15 0407 02/09/15 0740 02/09/15 1143  GLUCAP 140* 166* 153* 131* 117*    No results found for this or any previous visit (from the past 240 hour(s)).       Studies: Ir Catheter Tube Change  02/08/2015   CLINICAL DATA:  Cholecystectomy, postop hematoma, possibly infected. Poor output from drain catheter.  EXAM: ABSCESS  CATHETER EXCHANGE UNDER FLUOROSCOPY  FLUOROSCOPY TIME:  1.3 minutes, 71 mgy  TECHNIQUE: The procedure, risks (including but not limited to bleeding, infection, organ damage ), benefits, and alternatives were explained to the patient. Questions regarding the procedure were encouraged and answered. The patient understands and consents to the procedure. The abscess drain catheter and surrounding skin were prepped with Betadine, draped in usual sterile fashion.  Catheter was injected, partially opacifying the periphery of the known residual sub hepatic collection.  The catheter was cut and exchanged over a 0.035" angiographic wire for a new 14-French pigtail catheter, formed centrally within the collection system under fluoroscopy. Contrast injection confirms appropriate positioning. Catheter secured externally with 0 Prolene suture and StatLock.  Catheter placed to gravity bag. The patient tolerated the procedure well.  COMPLICATIONS: COMPLICATIONS none  IMPRESSION:  1. Technically successful exchange and up sizing of a gallbladder fossa abscess drain catheter under fluoroscopy   Electronically Signed   By: Corlis Leak M.D.   On: 02/08/2015 12:29   Dg Chest Port 1 View  02/08/2015   CLINICAL DATA:  Perihepatic hematoma post cholecystectomy  EXAM: PORTABLE CHEST - 1 VIEW  COMPARISON:  02/05/2015 and earlier studies  FINDINGS: Left IJ hemodialysis catheter placed to the low SVC. Right arm PICC line to the low SVC. Low lung volumes. No pneumothorax. Perihilar atelectasis or infiltrates right greater than left, slightly increased. No definite effusion. Heart size upper limits normal for technique.  Spondylitic changes near the thoracolumbar junction.  IMPRESSION: 1. Left IJ Dialysis catheter and right arm PICC placement to the low SVC without pneumothorax.   Electronically Signed   By: Corlis Leak M.D.   On: 02/08/2015 12:31        Scheduled Meds: . sodium chloride   Intravenous Once  . sodium chloride   Intravenous Once  . antiseptic oral rinse  7 mL Mouth Rinse BID  . darbepoetin (ARANESP) injection - NON-DIALYSIS  200 mcg Subcutaneous Q Wed-1800  . diphenhydrAMINE  12.5 mg Intravenous Once  . feeding supplement  1 Container Oral TID BM  . insulin aspart  0-15 Units Subcutaneous 6 times per day  . insulin glargine  35 Units Subcutaneous QHS  . megestrol  40 mg Oral Daily  . nystatin   Topical TID  . pantoprazole (PROTONIX) IV  40 mg Intravenous Q24H  .  piperacillin-tazobactam  3.375 g Intravenous 4 times per day  . sodium chloride  10-40 mL Intracatheter Q12H   Continuous Infusions: . sodium chloride 10 mL/hr at 01/31/15 0508  . heparin 2,400 Units/hr (02/09/15 0259)  . dialysis replacement fluid (prismasate) 500 mL/hr at 02/09/15 0539  . dialysis replacement fluid (prismasate) 300 mL/hr at 02/08/15 1226  . dialysate (PRISMASATE) 1,800 mL/hr at 02/09/15 1156  . TPN (CLINIMIX) Adult without lytes 40 mL/hr at 02/08/15 1747  . TPN (CLINIMIX) Adult without lytes      Principal Problem:   Septic shock Active Problems:   Anticoagulated   Paroxysmal a-fib   Acute blood loss anemia   Catheter-associated urinary tract infection   Intra-abdominal infection   Candidiasis of urogenital site  Acute respiratory failure with hypoxia   Infected hematoma following procedure   Leukocytosis   Diabetes mellitus with renal complications   Anemia of chronic disease   Hypokalemia   Hypomagnesemia   Acute renal failure   Protein-calorie malnutrition, severe   AKI (acute kidney injury)    Time spent: 40 minutes.    Marcellus Scott, MD, FACP, FHM. [facility] Pager [phone]  If 7PM-7AM, please contact night-coverage [email or web address] Password [facility] 02/09/2015, 12:31 PM    LOS: 21 days
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
## 2015-02-09 NOTE — Progress Notes (Signed)
Nutrition Follow-up  DOCUMENTATION CODES:   Severe malnutrition in context of acute illness/injury, Morbid obesity  INTERVENTION:  - Continue TPN per pharmacy - Continue to recommend TF. PEG vs Panda with Nepro @ 50 mL/hr with 30 mL Prostat TID which will provide 2460 kcal, 142 grams protein, and 872 mL free water - RD will continue to monitor for needs  NUTRITION DIAGNOSIS:   Inadequate oral intake related to lethargy/confusion as evidenced by meal completion < 25%. -ongoing  GOAL:   Patient will meet greater than or equal to 90% of their needs -unmet  MONITOR:   Weight trends, Labs, I & O's, Other (Comment) (TPN regimen)  ASSESSMENT:   69 y/o admitted in 10/05/2012 with Sepsis, hypotension, acute renal failure and acute cholecystitis. He had been place on Eliquis for his AF by the [place]. He had an MI 2 years ago and did not follow up with his cardiologist. He was acutely ill and underwent a percutaneous drain placement on 10/06/14 by IR. He went home on 10/11/14. He was readmitted on 12/30/14 and underwent laparoscopic cholecystectomy with IOC. He was left with a drain in place and the site had "SNOW' placed in the GB fossa for hemostasis after the procedure. Drain was removed last week but there was some brusing at the site. He is transferred from [facility] with complaints of increased abdominal pain.  8/24 Pt continue with Clinimix 5/15 @ 40 mL/hr without lipids. Spoke with pharmacist who reports plan to increase to 75 mL/hr today (this would provide 90 grams protein, 1278 kcal). He states overall goal rate of Clinimix 5/15 @ 110 mL/hr (this will provide 132 grams protein, 1874 kcal); pending nephrology input concerning adding lytes back into TPN.  MD notes indicate that pt is a functional quadriplegic in the setting of prolonged illness. Delirium also present and notes indicate pt pulling at lines and now with soft wrist restraints in place.  Possible PEG placement per radiology  note; TF recommendations as outlined above.  Not currently meeting needs. RD will continue to monitor. Medications reviewed.   Labs reviewed; CBGs: 131-213 mg/dL, Na: 454 mmol/L, Cl: 97 mmol/L, BUN/creatinine elevated but trending down, Ca: 8.2 mg/dL, Mg and Phos WDL, GRF: 13 (up from 9 yesterday).    8/23 - PICC placed yesterday - Pt currently receiving Clinimix 5/15 @ 40 mL/hr which is providing 48 grams protein, 682 kcal.  - Pt received one bag of lipids which was hung prior to transition to ICU status.  - Discussed pt with pharmacist and he also states plan to add sliding scale insulin to TPN for glycemic control. - Catheter for CRRT placed less than 30 minutes ago and CRRT being initiated at this time. Nutrition needs have been re-calculated due to this change. Needs based on standard body weight for pt who is morbidly obese - No indicator of inability to utilize the gut to feed pt other than prior episodes of emesis. If Reglan is medically feasible, recommend trail of scheduled Reglan, placement of Panda, and TF as outlined above which will meet 100% needs while limiting fluid.   8/22 - Follow-up for calorie count shows pt consumed 1/2 junior Whopper last night.  - Talked with wife who reports pt sleeps most of the day every day. - New consult for TPN. Talked with pharmacist who reports that discussion had by MD indicated that TF was discussed but then order for TPN initiation placed due to several instances of emesis. He also states that repeat CT  to be done today.  - No prokinect agents ordered; recommend trial of scheduled Reglan to assess if this aids in transit time and resolves emesis with oral/enteral route of nutrition. - PICC not yet in place - Concern for volume overload and refeeding with TPN. Pharmacist states plan to start TPN @ 50% goal rate initially.   8/19 - Pt ate 100% of dinner yesterday which consisted of diet Sprite, deli sandwich with mayo, deli ham, and  lettuce, vanilla ice cream, and 8 oz whole milk (578 kcal, 21.5 grams protein). - RN reports pt had nothing for breakfast so far this AM but he did drink 1/2 Boost Breeze (145 kcal, 4.5 grams protein). - Will d/c Prostat as RN states pt has had several instances of refusal of this supplement and will order vanilla Magic Cup BID as pt seems to like vanilla ice cream.  8/15 - Pt's diet advanced to Soft on 8/10 but pt has only been eating 5-10% on average.  - Pt reports abdominal pain which is consistent and not exacerbated by intakes.  - Family member asks about good options on Soft diet.  - Will order supplements; feel pt will do better with a clear liquid supplement at this time and will order Prostat to increase protein. - If poor intakes persist, recommend Panda tube with Vital AF 1.2 @ 60 mL/hr to provide 1728 kcal (97% minimal needs), 108 grams protein, and 1168 mL free water  Diet Order:  DIET SOFT Room service appropriate?: Yes; Fluid consistency:: Thin TPN (CLINIMIX) Adult without lytes  Skin:  Wound (see comment) (abdominal incision)  Last BM:  8/24  Height:   Ht Readings from Last 1 Encounters:  01/20/15  (1.676 m)    Weight:   Wt Readings from Last 1 Encounters:  02/09/15 274 lb 14.6 oz (124.7 kg)    Ideal Body Weight:  64.54 kg (kg)  BMI:  Body mass index is 44.39 kg/(m^2).  Estimated Nutritional Needs:   Kcal:  2400-2800  Protein:  120-160 grams  Fluid:  1-1.3 L/day  EDUCATION NEEDS:   No education needs identified at this time     Trenton Gammon, RD, LDN Inpatient Clinical Dietitian Pager # [phone] After hours/weekend pager # [phone]

## 2015-02-09 NOTE — Progress Notes (Signed)
PT Cancellation Note  Patient Details Name: Larry [name] MRN: [id number] DOB: 03-11-1946   Cancelled Treatment:    Reason Eval/Treat Not Completed: Medical issues which prohibited therapy (On CRRT, delerium, requiring soft restraints. May consider Discharge from therapy due to critical illness and  dialysis. would ask MD for guidance as to therapy indication at this time.)   Rada Hay 02/09/2015, 11:33 AM Blanchard Kelch PT [phone]

## 2015-02-09 NOTE — Progress Notes (Signed)
[facility]    Date of Admission:  01/19/2015   Total days of antibiotics 21        Day 21 piptazo   ID: Larry [name] is a 69 y.o. male with septic shock due to infected hematoma s/p IR drain, protein-caloric malnutrition and aki requiring HD Principal Problem:   Septic shock Active Problems:   Anticoagulated   Paroxysmal a-fib   Acute blood loss anemia   Catheter-associated urinary tract infection   Intra-abdominal infection   Candidiasis of urogenital site   Acute respiratory failure with hypoxia   Infected hematoma following procedure   Leukocytosis   Diabetes mellitus with renal complications   Anemia of chronic disease   Hypokalemia   Hypomagnesemia   Acute renal failure   Protein-calorie malnutrition, severe   AKI (acute kidney injury)   Encounter for palliative care    Subjective: Afebrile, remains on CRRT, predominantly confused all day. IR attempted aspiration of hematoma but only collected , reportedly too viscous  Medications:  . sodium chloride   Intravenous Once  . sodium chloride   Intravenous Once  . antiseptic oral rinse  7 mL Mouth Rinse BID  . darbepoetin (ARANESP) injection - NON-DIALYSIS  200 mcg Subcutaneous Q Wed-1800  . diphenhydrAMINE  12.5 mg Intravenous Once  . feeding supplement  1 Container Oral TID BM  . insulin aspart  0-15 Units Subcutaneous 6 times per day  . insulin glargine  35 Units Subcutaneous QHS  . megestrol  40 mg Oral Daily  . [START ON 02/10/2015] meropenem (MERREM) IV  500 mg Intravenous Q8H  . nystatin   Topical TID  . pantoprazole (PROTONIX) IV  40 mg Intravenous Q24H  . sodium chloride  10-40 mL Intracatheter Q12H    Objective: Vital signs in last 24 hours: Temp:  [98.1 F (36.7 C)-98.5 F (36.9 C)] 98.5 F (36.9 C) (08/24 2000) Pulse Rate:  [94-112] 106 (08/24 2200) Resp:  [20-39] 22 (08/24 2200) BP: (103-163)/(55-92) 163/78 mmHg (08/24 2200) SpO2:  [90 %-99 %] 96 % (08/24  2200) Weight:  [274 lb 14.6 oz (124.7 kg)] 274 lb 14.6 oz (124.7 kg) (08/24 0500)  Physical Exam  Constitutional: He appears chronically ill.sleeping HENT: dry oral mucosa Mouth/Throat: Oropharynx is clear and moist. No oropharyngeal exudate.  Neck: left HD cath IJ Cardiovascular: Normal rate, regular rhythm and normal heart sounds. Exam reveals no gallop and no friction rub.  No murmur heard.  Pulmonary/Chest: Effort normal and breath sounds normal. No respiratory distress. He has no wheezes.  Abdominal: Soft. Decrease BS Ext: anasarca +1 edeam Skin: Skin is warm and dry. No rash noted. No erythema.     Lab Results  Recent Labs  02/08/15 0540 02/09/15 0400 02/09/15 1600  WBC 17.4* 21.2*  --   HGB 6.8* 7.7*  --   HCT 21.7* 24.1*  --   NA 131* 132* 133*  K 3.9 4.0 4.1  CL 94* 97* 97*  CO2 24 26 26   BUN 66* 44* 34*  CREATININE 6.42* 4.26* 3.40*   Liver Panel  Recent Labs  02/07/15 0352  02/08/15 0540 02/09/15 0400 02/09/15 1600  PROT 6.5  --  6.6  --   --   ALBUMIN 2.4*  < > 2.1* 2.3* 2.2*  AST 20  --  58*  --   --   ALT 11*  --  19  --   --   ALKPHOS 99  --  131*  --   --  BILITOT 1.4*  --  1.0  --   --   < > = values in this interval not displayed.  Studies/Results: Ir Catheter Tube Change  02/08/2015   CLINICAL DATA:  Cholecystectomy, postop hematoma, possibly infected. Poor output from drain catheter.  EXAM: ABSCESS  CATHETER EXCHANGE UNDER FLUOROSCOPY  FLUOROSCOPY TIME:  1.3 minutes, 71 mgy  TECHNIQUE: The procedure, risks (including but not limited to bleeding, infection, organ damage ), benefits, and alternatives were explained to the patient. Questions regarding the procedure were encouraged and answered. The patient understands and consents to the procedure. The abscess drain catheter and surrounding skin were prepped with Betadine, draped in usual sterile fashion. Catheter was injected, partially opacifying the periphery of the known residual sub hepatic  collection.  The catheter was cut and exchanged over a 0.035" angiographic wire for a new 14-French pigtail catheter, formed centrally within the collection system under fluoroscopy. Contrast injection confirms appropriate positioning. Catheter secured externally with 0 Prolene suture and StatLock.  Catheter placed to gravity bag. The patient tolerated the procedure well.  COMPLICATIONS: COMPLICATIONS none  IMPRESSION:  1. Technically successful exchange and up sizing of a gallbladder fossa abscess drain catheter under fluoroscopy   Electronically Signed   By: Corlis Leak M.D.   On: 02/08/2015 12:29   US Paracentesis  02/09/2015   INDICATION: ascites  EXAM: ULTRASOUND-GUIDED PARACENTESIS--Bedside/portable  COMPARISON:  None.  MEDICATIONS: 100 cc 1% lidocaine  COMPLICATIONS: None immediate  TECHNIQUE: Informed written consent was obtained from the patient after a discussion of the risks, benefits and alternatives to treatment. A timeout was performed prior to the initiation of the procedure.  Initial ultrasound scanning demonstrates a large amount of ascites within the right lower abdominal quadrant. The right lower abdomen was prepped and draped in the usual sterile fashion. 1% lidocaine with epinephrine was used for local anesthesia. Under direct ultrasound guidance, a 19 gauge, 7-cm, Yueh catheter was introduced. An ultrasound image was saved for documentation purposed. The paracentesis was performed. The catheter was removed and a dressing was applied. The patient tolerated the procedure well without immediate post procedural complication.  FINDINGS: A total of approximately 150 cc of bloody appearing fluid was removed. Samples were sent to the laboratory as requested by the clinical team.  IMPRESSION: Successful ultrasound-guided paracentesis yielding 150 cc of peritoneal fluid.  Read by:  Robet Leu [facility]   Electronically Signed   By: Simonne Come M.D.   On: 02/09/2015 15:13   Dg Chest Port 1  View  02/08/2015   CLINICAL DATA:  Perihepatic hematoma post cholecystectomy  EXAM: PORTABLE CHEST - 1 VIEW  COMPARISON:  02/05/2015 and earlier studies  FINDINGS: Left IJ hemodialysis catheter placed to the low SVC. Right arm PICC line to the low SVC. Low lung volumes. No pneumothorax. Perihilar atelectasis or infiltrates right greater than left, slightly increased. No definite effusion. Heart size upper limits normal for technique.  Spondylitic changes near the thoracolumbar junction.  IMPRESSION: 1. Left IJ Dialysis catheter and right arm PICC placement to the low SVC without pneumothorax.   Electronically Signed   By: Corlis Leak M.D.   On: 02/08/2015 12:31     Assessment/Plan: Intra-abdominal infection with infected hematoma post choelcystectomy = unclear what pathogens are involved in his IAI. Spoke with lab to get gram stain and culture on aspirate from today. Will change his abtx to meropenem for now until culture results return.  aki = getting hemodialysis for now. Will renally dose meropenem  Leukocytosis = worsening,   Severe protein-caloric malnutrition = continue with tpn for now  Agree with palliative care consultation  Spoke with Dr. Rogelia Mire regarding recommendations and pharmacy for dosing of meropenem    Hazely Sealey, [facility] for Infectious Diseases Cell: [phone] Pager: [phone]  02/09/2015, 10:25 PM

## 2015-02-09 NOTE — Progress Notes (Signed)
Pt pulling on CRRT tubing and other IVs due to delirium.  Informed Dr Waymon Amato.  Soft wrist restraints ordered for pt safety.  Erick Blinks, RN
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
## 2015-02-09 NOTE — Progress Notes (Addendum)
Subjective: Started diaylsis - appears to be tolerating. Moaning some this am. C/o rt abd pain. No reports of emesis. confused  Objective: Vital signs in last 24 hours: Temp:  [97.8 F (36.6 C)-98.5 F (36.9 C)] 98.2 F (36.8 C) (08/24 0800) Pulse Rate:  [81-112] 107 (08/24 0700) Resp:  [18-39] 28 (08/24 0700) BP: (91-161)/(35-87) 130/75 mmHg (08/24 0700) SpO2:  [90 %-100 %] 96 % (08/24 0700) Weight:  [124.7 kg (274 lb 14.6 oz)] 124.7 kg (274 lb 14.6 oz) (08/24 0500) Last BM Date: 02/04/15  Intake/Output from previous day: 08/23 0701 - 08/24 0700 In: 2062 [I.V.:512; Blood:375; IV Piggyback:200; TPN:965] Out: 2821 [Urine:115; Drains:5] Intake/Output this shift: Total I/O In: -  Out: 191 [Other:191]   moaning at times, can tell "16", "august" - that's about it cta Obese, a little tighter in upper abd than last week, doesn't seem to grimace too much when I palpate his abd, no peritonitis Some edema  Lab Results:   Recent Labs  02/08/15 0540 02/09/15 0400  WBC 17.4* 21.2*  HGB 6.8* 7.7*  HCT 21.7* 24.1*  PLT 330 344   BMET  Recent Labs  02/08/15 0540 02/09/15 0400  NA 131* 132*  K 3.9 4.0  CL 94* 97*  CO2 24 26  GLUCOSE 227* 156*  BUN 66* 44*  CREATININE 6.42* 4.26*  CALCIUM 8.4* 8.2*   PT/INR No results for input(s): LABPROT, INR in the last 72 hours. ABG No results for input(s): PHART, HCO3 in the last 72 hours.  Invalid input(s): PCO2, PO2  Studies/Results: Ct Abdomen Pelvis Wo Contrast  02/07/2015   CLINICAL DATA:  Status post gallbladder fossa drain, check for resolution  EXAM: CT ABDOMEN AND PELVIS WITHOUT CONTRAST  TECHNIQUE: Multidetector CT imaging of the abdomen and pelvis was performed following the standard protocol without IV contrast.  COMPARISON:  02/03/2015  FINDINGS: The lung bases demonstrate bilateral pleural effusions right greater than left with associated right lower lobe consolidation. These changes are stable from the prior  exam.  The liver is stable in appearance. A large perihepatic fluid collection is again identified and stable. A drain is noted within the gallbladder fossa collection although the collection does not appear to have significantly improved in the interval. It again measures approximately 9.5 x 8.9 cm and demonstrates air within.  The spleen, adrenal glands, pancreas and kidneys are stable in appearance from the prior study.  Free fluid is again noted within the abdomen and pelvis. The bladder is decompressed by Foley catheter. Diverticular change of the colon is seen. Diffuse aortoiliac calcifications are noted. Postoperative changes consistent with a right hip replacement are seen. Degenerative changes of the lumbar spine are noted.  IMPRESSION: Stable perihepatic hematoma when compared with the prior exam.  Stable gallbladder fossa collection despite adequate drainage catheter placement.  Significant free fluid within the abdomen and pelvis slightly increased from the prior exam.  The remainder of the study is stable.   Electronically Signed   By: Alcide Clever M.D.   On: 02/07/2015 11:50   Ir Catheter Tube Change  02/08/2015   CLINICAL DATA:  Cholecystectomy, postop hematoma, possibly infected. Poor output from drain catheter.  EXAM: ABSCESS  CATHETER EXCHANGE UNDER FLUOROSCOPY  FLUOROSCOPY TIME:  1.3 minutes, 71 mgy  TECHNIQUE: The procedure, risks (including but not limited to bleeding, infection, organ damage ), benefits, and alternatives were explained to the patient. Questions regarding the procedure were encouraged and answered. The patient understands and consents to the procedure. The  abscess drain catheter and surrounding skin were prepped with Betadine, draped in usual sterile fashion. Catheter was injected, partially opacifying the periphery of the known residual sub hepatic collection.  The catheter was cut and exchanged over a 0.035" angiographic wire for a new 14-French pigtail catheter, formed  centrally within the collection system under fluoroscopy. Contrast injection confirms appropriate positioning. Catheter secured externally with 0 Prolene suture and StatLock.  Catheter placed to gravity bag. The patient tolerated the procedure well.  COMPLICATIONS: COMPLICATIONS none  IMPRESSION:  1. Technically successful exchange and up sizing of a gallbladder fossa abscess drain catheter under fluoroscopy   Electronically Signed   By: Corlis Leak M.D.   On: 02/08/2015 12:29   Dg Chest Port 1 View  02/08/2015   CLINICAL DATA:  Perihepatic hematoma post cholecystectomy  EXAM: PORTABLE CHEST - 1 VIEW  COMPARISON:  02/05/2015 and earlier studies  FINDINGS: Left IJ hemodialysis catheter placed to the low SVC. Right arm PICC line to the low SVC. Low lung volumes. No pneumothorax. Perihilar atelectasis or infiltrates right greater than left, slightly increased. No definite effusion. Heart size upper limits normal for technique.  Spondylitic changes near the thoracolumbar junction.  IMPRESSION: 1. Left IJ Dialysis catheter and right arm PICC placement to the low SVC without pneumothorax.   Electronically Signed   By: Corlis Leak M.D.   On: 02/08/2015 12:31    Anti-infectives: Anti-infectives    Start     Dose/Rate Route Frequency Ordered Stop   02/08/15 1800  piperacillin-tazobactam (ZOSYN) IVPB 3.375 g     3.375 g 100 mL/hr over 30 Minutes Intravenous 4 times per day 02/08/15 1403     02/07/15 1400  piperacillin-tazobactam (ZOSYN) IVPB 2.25 g  Status:  Discontinued     2.25 g 100 mL/hr over 30 Minutes Intravenous 3 times per day 02/07/15 0750 02/08/15 1403   02/04/15 1200  piperacillin-tazobactam (ZOSYN) IVPB 2.25 g  Status:  Discontinued     2.25 g 100 mL/hr over 30 Minutes Intravenous 4 times per day 02/04/15 0859 02/04/15 0905   02/04/15 1200  piperacillin-tazobactam (ZOSYN) IVPB 3.375 g  Status:  Discontinued     3.375 g 12.5 mL/hr over 240 Minutes Intravenous Every 8 hours 02/04/15 0905 02/07/15  0750   02/02/15 2000  fluconazole (DIFLUCAN) tablet 100 mg     100 mg Oral Every 24 hours 02/02/15 1908 02/08/15 2041   02/02/15 1800  fluconazole (DIFLUCAN) tablet 100 mg  Status:  Discontinued     100 mg Oral Daily 02/02/15 1545 02/02/15 1901   01/31/15 1000  fluconazole (DIFLUCAN) tablet 100 mg  Status:  Discontinued     100 mg Oral Daily 01/31/15 0727 02/02/15 1313   01/29/15 1700  piperacillin-tazobactam (ZOSYN) IVPB 2.25 g  Status:  Discontinued     2.25 g 100 mL/hr over 30 Minutes Intravenous Every 8 hours 01/29/15 0845 02/04/15 0859   01/24/15 2200  vancomycin (VANCOCIN) IVPB 1000 mg/200 mL premix     1,000 mg 200 mL/hr over 60 Minutes Intravenous  Once 01/24/15 1349 01/24/15 2301   01/21/15 2000  vancomycin (VANCOCIN) 1,500 mg in sodium chloride 0.9 % 500 mL IVPB  Status:  Discontinued     1,500 mg 250 mL/hr over 120 Minutes Intravenous Every 24 hours 01/21/15 1048 01/23/15 1726   01/20/15 2000  vancomycin (VANCOCIN) 1,250 mg in sodium chloride 0.9 % 250 mL IVPB  Status:  Discontinued     1,250 mg 166.7 mL/hr over 90 Minutes Intravenous  Every 24 hours 01/19/15 1945 01/20/15 0843   01/20/15 2000  vancomycin (VANCOCIN) 1,250 mg in sodium chloride 0.9 % 250 mL IVPB  Status:  Discontinued     1,250 mg 166.7 mL/hr over 90 Minutes Intravenous Every 24 hours 01/20/15 1519 01/21/15 1048   01/20/15 0000  piperacillin-tazobactam (ZOSYN) IVPB 3.375 g  Status:  Discontinued     3.375 g 12.5 mL/hr over 240 Minutes Intravenous Every 8 hours 01/19/15 1946 01/29/15 0845   01/19/15 1715  vancomycin (VANCOCIN) 2,500 mg in sodium chloride 0.9 % 500 mL IVPB     2,500 mg 250 mL/hr over 120 Minutes Intravenous  Once 01/19/15 1707 01/19/15 2159   01/19/15 1715  piperacillin-tazobactam (ZOSYN) IVPB 3.375 g     3.375 g 100 mL/hr over 30 Minutes Intravenous  Once 01/19/15 1707 01/19/15 1900      Assessment/Plan: GB fossa hematoma - no evidence of bile in drain. Reviewed recent CTs with IR  attending. IR Concerned that he may have ongoing slow bleed in abd given increased fluid on recent CTs that has HU consistent with blood. Not unreasonable given recent drop in hgb. Therefore IR reluctant to do TPA today. Recommended paracentesis and drain change. Have asked them to send paracentesis fluid for culture  Not sure if ready for G tube today. Wife not at bedside. He does need enteral feeds for nutrition. If want to try panda in interim ok with but will need G tube if family will sign off on. Concerned that he will pull panda given confusion  Not sure what is driving wbc.  Repeat UA? Blood cultures? No fevers.   Will discuss with medicine - ongoing need for heparin gtt? If drops hgb again will need to stop for sure. Ok with palliative care consult to discuss goals of care - not withdraw/ not escalation of care per se. But as i explained to wife yesterday it is possible for him to have other organ systems start to decline and if so would he want to have cpr, chest compressions, etc   Updated sister  [name]. [name], MD, FACS General, Bariatric, & Minimally Invasive Surgery [facility], [place]   LOS: 21 days    [name] 02/09/2015
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
## 2015-02-09 NOTE — Progress Notes (Signed)
[facility] Progress Note   Subjective: on CRRT, I/O neg 800 yest, neg 900 cc so far today, BP stable at -100cc/hr  Filed Vitals:   02/09/15 1400 02/09/15 1500 02/09/15 1600 02/09/15 1718  BP: 141/66 113/63 137/56 127/77  Pulse: 103 99 102 102  Temp:      TempSrc:      Resp: 24 35 31 20  Height:      Weight:      SpO2: 97% 91% 96% 95%   Exam: Pale, still confused No jvd Chest rales R base, L clear RRR no MRG Abd soft ntnd +BS, 2-3+ ascites, RUQ drain 2 + pitting edema bilat LE's from feet to hips/ scrotum Neuro no asterixis, disoriented but conversant  CXR 8/20 no acute changes UA renal 11-12 cm, no hydro, normal echo     Assessment: 1 AKI prolonged ATN - D#2 of CRRT, labs better, still confused however 2 Anemia to get prbc's today 3 RUQ abscess on AB + drain - CT 8/22 shows no improvement in gas/fluid collection RUQ since 8/18 study 4 Obesity 5 DM2  6 Nutrition onTNA 7 Volume excess / anasarca/ ascites - starting to make some improvement with CRRT  Plan - cont CRRT, Payton Spark MD  pager [phone]    cell [phone]  02/09/2015, 5:39 PM     Recent Labs Lab 02/08/15 0540 02/09/15 0400 02/09/15 1600  NA 131* 132* 133*  K 3.9 4.0 4.1  CL 94* 97* 97*  CO2 24 26 26   GLUCOSE 227* 156* 142*  BUN 66* 44* 34*  CREATININE 6.42* 4.26* 3.40*  CALCIUM 8.4* 8.2* 8.2*  PHOS 5.8* 3.4  3.5 2.7    Recent Labs Lab 02/06/15 0425 02/07/15 0352  02/08/15 0540 02/09/15 0400 02/09/15 1600  AST 20 20  --  58*  --   --   ALT 13* 11*  --  19  --   --   ALKPHOS 87 99  --  131*  --   --   BILITOT 1.0 1.4*  --  1.0  --   --   PROT 6.5 6.5  --  6.6  --   --   ALBUMIN 2.2* 2.4*  < > 2.1* 2.3* 2.2*  < > = values in this interval not displayed.  Recent Labs Lab 02/05/15 0630 02/06/15 0425 02/07/15 0352 02/08/15 0540 02/09/15 0400  WBC 13.3* 14.4* 14.3* 17.4* 21.2*  NEUTROABS 11.5* 12.3* 12.1*  --   --   HGB 7.3* 7.5* 7.6* 6.8* 7.7*  HCT  22.4* 23.0* 23.7* 21.7* 24.1*  MCV 90.3 90.2 91.2 89.3 88.6  PLT 369 372 390 330 344   . sodium chloride   Intravenous Once  . sodium chloride   Intravenous Once  . antiseptic oral rinse  7 mL Mouth Rinse BID  . darbepoetin (ARANESP) injection - NON-DIALYSIS  200 mcg Subcutaneous Q Wed-1800  . diphenhydrAMINE  12.5 mg Intravenous Once  . feeding supplement  1 Container Oral TID BM  . insulin aspart  0-15 Units Subcutaneous 6 times per day  . insulin glargine  35 Units Subcutaneous QHS  . megestrol  40 mg Oral Daily  . meropenem (MERREM) IV  1 g Intravenous Once  . [START ON 02/10/2015] meropenem (MERREM) IV  500 mg Intravenous 4 times per day  . nystatin   Topical TID  . pantoprazole (PROTONIX) IV  40 mg Intravenous Q24H  . sodium chloride  10-40 mL Intracatheter Q12H   .  sodium chloride 10 mL/hr at 01/31/15 0508  . heparin 2,400 Units/hr (02/09/15 1621)  . dialysis replacement fluid (prismasate) 500 mL/hr at 02/09/15 0539  . dialysis replacement fluid (prismasate) 300 mL/hr at 02/08/15 1226  . dialysate (PRISMASATE) 1,800 mL/hr at 02/09/15 1502  . TPN (CLINIMIX) Adult without lytes Stopped (02/09/15 1713)  . TPN (CLINIMIX) Adult without lytes 75 mL/hr at 02/09/15 1714   acetaminophen, alteplase, fentaNYL (SUBLIMAZE) injection, heparin, heparin, iohexol, levalbuterol, LORazepam, [DISCONTINUED] ondansetron **OR** ondansetron (ZOFRAN) IV, sodium chloride

## 2015-02-10 ENCOUNTER — Inpatient Hospital Stay (HOSPITAL_COMMUNITY): Payer: Medicare Other

## 2015-02-10 DIAGNOSIS — G9340 Encephalopathy, unspecified: Secondary | ICD-10-CM | POA: Insufficient documentation

## 2015-02-10 DIAGNOSIS — I480 Paroxysmal atrial fibrillation: Secondary | ICD-10-CM

## 2015-02-10 LAB — PHOSPHORUS: PHOSPHORUS: 2.2 mg/dL — AB (ref 2.5–4.6)

## 2015-02-10 LAB — BLOOD GAS, ARTERIAL
Acid-Base Excess: 0.6 mmol/L (ref 0.0–2.0)
Bicarbonate: 24.7 mEq/L — ABNORMAL HIGH (ref 20.0–24.0)
Drawn by: 244801
O2 Content: 2 L/min
O2 SAT: 92.6 %
PCO2 ART: 39.6 mmHg (ref 35.0–45.0)
PH ART: 7.411 (ref 7.350–7.450)
PO2 ART: 68.7 mmHg — AB (ref 80.0–100.0)
Patient temperature: 37
TCO2: 23.5 mmol/L (ref 0–100)

## 2015-02-10 LAB — MAGNESIUM: MAGNESIUM: 2.2 mg/dL (ref 1.7–2.4)

## 2015-02-10 LAB — COMPREHENSIVE METABOLIC PANEL
ALBUMIN: 2.3 g/dL — AB (ref 3.5–5.0)
ALK PHOS: 138 U/L — AB (ref 38–126)
ALT: 37 U/L (ref 17–63)
ANION GAP: 10 (ref 5–15)
AST: 78 U/L — AB (ref 15–41)
BILIRUBIN TOTAL: 1.3 mg/dL — AB (ref 0.3–1.2)
BUN: 30 mg/dL — AB (ref 6–20)
CO2: 25 mmol/L (ref 22–32)
Calcium: 8.3 mg/dL — ABNORMAL LOW (ref 8.9–10.3)
Chloride: 99 mmol/L — ABNORMAL LOW (ref 101–111)
Creatinine, Ser: 2.98 mg/dL — ABNORMAL HIGH (ref 0.61–1.24)
GFR calc Af Amer: 23 mL/min — ABNORMAL LOW (ref >60)
GFR calc non Af Amer: 20 mL/min — ABNORMAL LOW (ref >60)
GLUCOSE: 152 mg/dL — AB (ref 65–99)
POTASSIUM: 4 mmol/L (ref 3.5–5.1)
SODIUM: 134 mmol/L — AB (ref 135–145)
Total Protein: 7.3 g/dL (ref 6.5–8.1)

## 2015-02-10 LAB — GLUCOSE, CAPILLARY
GLUCOSE-CAPILLARY: 140 mg/dL — AB (ref 65–99)
GLUCOSE-CAPILLARY: 152 mg/dL — AB (ref 65–99)
GLUCOSE-CAPILLARY: 158 mg/dL — AB (ref 65–99)
Glucose-Capillary: 131 mg/dL — ABNORMAL HIGH (ref 65–99)
Glucose-Capillary: 180 mg/dL — ABNORMAL HIGH (ref 65–99)
Glucose-Capillary: 194 mg/dL — ABNORMAL HIGH (ref 65–99)
Glucose-Capillary: 212 mg/dL — ABNORMAL HIGH (ref 65–99)

## 2015-02-10 LAB — CBC
HEMATOCRIT: 24.5 % — AB (ref 39.0–52.0)
HEMOGLOBIN: 7.9 g/dL — AB (ref 13.0–17.0)
MCH: 28.8 pg (ref 26.0–34.0)
MCHC: 32.2 g/dL (ref 30.0–36.0)
MCV: 89.4 fL (ref 78.0–100.0)
Platelets: 367 10*3/uL (ref 150–400)
RBC: 2.74 MIL/uL — ABNORMAL LOW (ref 4.22–5.81)
RDW: 17.9 % — AB (ref 11.5–15.5)
WBC: 20.1 10*3/uL — ABNORMAL HIGH (ref 4.0–10.5)

## 2015-02-10 LAB — PATHOLOGIST SMEAR REVIEW

## 2015-02-10 LAB — APTT: aPTT: 163 seconds — ABNORMAL HIGH (ref 24–37)

## 2015-02-10 LAB — HEPARIN LEVEL (UNFRACTIONATED): Heparin Unfractionated: 0.56 IU/mL (ref 0.30–0.70)

## 2015-02-10 MED ORDER — HALOPERIDOL LACTATE 5 MG/ML IJ SOLN
1.0000 mg | INTRAMUSCULAR | Status: DC | PRN
  Administered 2015-02-10: 4 mg via INTRAVENOUS
  Filled 2015-02-10: qty 1

## 2015-02-10 MED ORDER — SODIUM CHLORIDE 0.9 % IV SOLN
200.0000 mg | Freq: Once | INTRAVENOUS | Status: AC
  Administered 2015-02-10: 200 mg via INTRAVENOUS
  Filled 2015-02-10: qty 200

## 2015-02-10 MED ORDER — HEPARIN (PORCINE) IN NACL 100-0.45 UNIT/ML-% IJ SOLN
2300.0000 [IU]/h | INTRAMUSCULAR | Status: DC
  Filled 2015-02-10 (×2): qty 250

## 2015-02-10 MED ORDER — SODIUM PHOSPHATE 3 MMOLE/ML IV SOLN
20.0000 mmol | Freq: Once | INTRAVENOUS | Status: AC
  Administered 2015-02-10: 20 mmol via INTRAVENOUS
  Filled 2015-02-10: qty 6.67

## 2015-02-10 MED ORDER — IPRATROPIUM BROMIDE 0.02 % IN SOLN: 0.5000 mg | RESPIRATORY_TRACT | Status: DC | PRN

## 2015-02-10 MED ORDER — SODIUM CHLORIDE 0.9 % IV SOLN
100.0000 mg | INTRAVENOUS | Status: DC
  Administered 2015-02-11 – 2015-02-22 (×12): 100 mg via INTRAVENOUS
  Filled 2015-02-10 (×17): qty 100

## 2015-02-10 MED ORDER — ALBUTEROL SULFATE (2.5 MG/3ML) 0.083% IN NEBU: 2.5000 mg | INHALATION_SOLUTION | RESPIRATORY_TRACT | Status: DC | PRN

## 2015-02-10 MED ORDER — LORAZEPAM 2 MG/ML IJ SOLN
0.5000 mg | Freq: Four times a day (QID) | INTRAMUSCULAR | Status: DC | PRN
  Administered 2015-02-10: 0.5 mg via INTRAVENOUS
  Filled 2015-02-10: qty 1

## 2015-02-10 MED ORDER — METOCLOPRAMIDE HCL 5 MG/ML IJ SOLN
5.0000 mg | Freq: Three times a day (TID) | INTRAMUSCULAR | Status: AC
  Administered 2015-02-10 – 2015-02-11 (×4): 5 mg via INTRAVENOUS
  Filled 2015-02-10 (×4): qty 2

## 2015-02-10 MED ORDER — SODIUM CHLORIDE 0.9 % IJ SOLN
Freq: Once | INTRAMUSCULAR | Status: AC
  Administered 2015-02-10: 15:00:00
  Filled 2015-02-10: qty 6

## 2015-02-10 MED ORDER — TRACE MINERALS CR-CU-MN-SE-ZN 10-1000-500-60 MCG/ML IV SOLN
INTRAVENOUS | Status: AC
  Administered 2015-02-10: 17:00:00 via INTRAVENOUS
  Filled 2015-02-10: qty 2000

## 2015-02-10 MED ORDER — NEPRO/CARBSTEADY PO LIQD
1000.0000 mL | ORAL | Status: DC
  Administered 2015-02-10 – 2015-02-11 (×2): 1000 mL
  Filled 2015-02-10 (×4): qty 1000

## 2015-02-10 NOTE — Progress Notes (Signed)
Discussed case with Dr. Fredia Sorrow and will start tpa protocol today for 1 hour into GB fossa drain and repeat protocol tomorrow for 2 hours.  tpa/30cc sterile saline injected into drain tubing and capped at 3:30pm, will uncap and place to JP suction bulb at 4:30pm.  Pattricia Boss PA-C Interventional Radiology  02/10/15 3:53 PM

## 2015-02-10 NOTE — Progress Notes (Signed)
Spoke with nurse to get update.  Spoke with IR - going to TPA into perc drain Hold iv heparin while doing this and at least 24hrs afterwards. i think risk of bleeding significantly outweighs risk of stroke and will discuss with medicine if we should even resume systemic IV heparin for PAF Not much out from NG. Will start trophic enteral feeds and check residuals.  Spoke with wife on phone and gave update.   Larry [name]. [name], MD, FACS General, Bariatric, & Minimally Invasive Surgery [facility], [place]

## 2015-02-10 NOTE — Progress Notes (Addendum)
Subjective: Had paracentesis of RUQ hematoma and got 150cc bloody fluid - so far G stain negative for org, just WBC. Has vomited twice. Confused, biting at times per nurse. Pt states he vomited twice  Objective: Vital signs in last 24 hours: Temp:  [97.8 F (36.6 C)-98.5 F (36.9 C)] 97.8 F (36.6 C) (08/25 0800) Pulse Rate:  [50-109] 103 (08/25 0800) Resp:  [20-40] 29 (08/25 0800) BP: (97-163)/(31-92) 114/63 mmHg (08/25 0800) SpO2:  [91 %-97 %] 94 % (08/25 0800) Weight:  [120.6 kg (265 lb 14 oz)] 120.6 kg (265 lb 14 oz) (08/25 0500) Last BM Date: 02/04/15  Intake/Output from previous day: 08/24 0701 - 08/25 0700 In: 2310.2 [I.V.:636; IV Piggyback:200; TPN:1441.2] Out: 4958 [Urine:22; Emesis/NG output:30; Drains:10] Intake/Output this shift: Total I/O In: 225 [I.V.:75; TPN:150] Out: 504 [Urine:3; Other:501]  Awake, confused, not oriented Obese, softer today, doesn't grimace or c/o pain on palpation. Drain - old blood, not bile  Lab Results:   Recent Labs  02/09/15 0400 02/10/15 0452  WBC 21.2* 20.1*  HGB 7.7* 7.9*  HCT 24.1* 24.5*  PLT 344 367   BMET  Recent Labs  02/09/15 1600 02/10/15 0452  NA 133* 134*  K 4.1 4.0  CL 97* 99*  CO2 26 25  GLUCOSE 142* 152*  BUN 34* 30*  CREATININE 3.40* 2.98*  CALCIUM 8.2* 8.3*   PT/INR No results for input(s): LABPROT, INR in the last 72 hours. ABG No results for input(s): PHART, HCO3 in the last 72 hours.  Invalid input(s): PCO2, PO2  Studies/Results: Ir Catheter Tube Change  02/08/2015   CLINICAL DATA:  Cholecystectomy, postop hematoma, possibly infected. Poor output from drain catheter.  EXAM: ABSCESS  CATHETER EXCHANGE UNDER FLUOROSCOPY  FLUOROSCOPY TIME:  1.3 minutes, 71 mgy  TECHNIQUE: The procedure, risks (including but not limited to bleeding, infection, organ damage ), benefits, and alternatives were explained to the patient. Questions regarding the procedure were encouraged and answered. The patient  understands and consents to the procedure. The abscess drain catheter and surrounding skin were prepped with Betadine, draped in usual sterile fashion. Catheter was injected, partially opacifying the periphery of the known residual sub hepatic collection.  The catheter was cut and exchanged over a 0.035" angiographic wire for a new 14-French pigtail catheter, formed centrally within the collection system under fluoroscopy. Contrast injection confirms appropriate positioning. Catheter secured externally with 0 Prolene suture and StatLock.  Catheter placed to gravity bag. The patient tolerated the procedure well.  COMPLICATIONS: COMPLICATIONS none  IMPRESSION:  1. Technically successful exchange and up sizing of a gallbladder fossa abscess drain catheter under fluoroscopy   Electronically Signed   By: Corlis Leak M.D.   On: 02/08/2015 12:29   US Paracentesis  02/09/2015   INDICATION: ascites  EXAM: ULTRASOUND-GUIDED PARACENTESIS--Bedside/portable  COMPARISON:  None.  MEDICATIONS: 100 cc 1% lidocaine  COMPLICATIONS: None immediate  TECHNIQUE: Informed written consent was obtained from the patient after a discussion of the risks, benefits and alternatives to treatment. A timeout was performed prior to the initiation of the procedure.  Initial ultrasound scanning demonstrates a large amount of ascites within the right lower abdominal quadrant. The right lower abdomen was prepped and draped in the usual sterile fashion. 1% lidocaine with epinephrine was used for local anesthesia. Under direct ultrasound guidance, a 19 gauge, 7-cm, Yueh catheter was introduced. An ultrasound image was saved for documentation purposed. The paracentesis was performed. The catheter was removed and a dressing was applied. The patient tolerated the procedure  well without immediate post procedural complication.  FINDINGS: A total of approximately 150 cc of bloody appearing fluid was removed. Samples were sent to the laboratory as requested by  the clinical team.  IMPRESSION: Successful ultrasound-guided paracentesis yielding 150 cc of peritoneal fluid.  Read by:  Robet Leu [facility]   Electronically Signed   By: Simonne Come M.D.   On: 02/09/2015 15:13   Dg Chest Port 1 View  02/08/2015   CLINICAL DATA:  Perihepatic hematoma post cholecystectomy  EXAM: PORTABLE CHEST - 1 VIEW  COMPARISON:  02/05/2015 and earlier studies  FINDINGS: Left IJ hemodialysis catheter placed to the low SVC. Right arm PICC line to the low SVC. Low lung volumes. No pneumothorax. Perihilar atelectasis or infiltrates right greater than left, slightly increased. No definite effusion. Heart size upper limits normal for technique.  Spondylitic changes near the thoracolumbar junction.  IMPRESSION: 1. Left IJ Dialysis catheter and right arm PICC placement to the low SVC without pneumothorax.   Electronically Signed   By: Corlis Leak M.D.   On: 02/08/2015 12:31    Anti-infectives: Anti-infectives    Start     Dose/Rate Route Frequency Ordered Stop   02/10/15 0200  meropenem (MERREM) 500 mg in sodium chloride 0.9 % 50 mL IVPB     500 mg 100 mL/hr over 30 Minutes Intravenous Every 8 hours 02/09/15 1808     02/10/15 0000  meropenem (MERREM) 500 mg in sodium chloride 0.9 % 50 mL IVPB  Status:  Discontinued     500 mg 100 mL/hr over 30 Minutes Intravenous 4 times per day 02/09/15 1655 02/09/15 1808   02/09/15 1800  meropenem (MERREM) 1 g in sodium chloride 0.9 % 100 mL IVPB     1 g 200 mL/hr over 30 Minutes Intravenous  Once 02/09/15 1655 02/09/15 1818   02/08/15 1800  piperacillin-tazobactam (ZOSYN) IVPB 3.375 g  Status:  Discontinued     3.375 g 100 mL/hr over 30 Minutes Intravenous 4 times per day 02/08/15 1403 02/09/15 1655   02/07/15 1400  piperacillin-tazobactam (ZOSYN) IVPB 2.25 g  Status:  Discontinued     2.25 g 100 mL/hr over 30 Minutes Intravenous 3 times per day 02/07/15 0750 02/08/15 1403   02/04/15 1200  piperacillin-tazobactam (ZOSYN) IVPB 2.25 g  Status:   Discontinued     2.25 g 100 mL/hr over 30 Minutes Intravenous 4 times per day 02/04/15 0859 02/04/15 0905   02/04/15 1200  piperacillin-tazobactam (ZOSYN) IVPB 3.375 g  Status:  Discontinued     3.375 g 12.5 mL/hr over 240 Minutes Intravenous Every 8 hours 02/04/15 0905 02/07/15 0750   02/02/15 2000  fluconazole (DIFLUCAN) tablet 100 mg     100 mg Oral Every 24 hours 02/02/15 1908 02/08/15 2041   02/02/15 1800  fluconazole (DIFLUCAN) tablet 100 mg  Status:  Discontinued     100 mg Oral Daily 02/02/15 1545 02/02/15 1901   01/31/15 1000  fluconazole (DIFLUCAN) tablet 100 mg  Status:  Discontinued     100 mg Oral Daily 01/31/15 0727 02/02/15 1313   01/29/15 1700  piperacillin-tazobactam (ZOSYN) IVPB 2.25 g  Status:  Discontinued     2.25 g 100 mL/hr over 30 Minutes Intravenous Every 8 hours 01/29/15 0845 02/04/15 0859   01/24/15 2200  vancomycin (VANCOCIN) IVPB 1000 mg/200 mL premix     1,000 mg 200 mL/hr over 60 Minutes Intravenous  Once 01/24/15 1349 01/24/15 2301   01/21/15 2000  vancomycin (VANCOCIN) 1,500 mg in  sodium chloride 0.9 % 500 mL IVPB  Status:  Discontinued     1,500 mg 250 mL/hr over 120 Minutes Intravenous Every 24 hours 01/21/15 1048 01/23/15 1726   01/20/15 2000  vancomycin (VANCOCIN) 1,250 mg in sodium chloride 0.9 % 250 mL IVPB  Status:  Discontinued     1,250 mg 166.7 mL/hr over 90 Minutes Intravenous Every 24 hours 01/19/15 1945 01/20/15 0843   01/20/15 2000  vancomycin (VANCOCIN) 1,250 mg in sodium chloride 0.9 % 250 mL IVPB  Status:  Discontinued     1,250 mg 166.7 mL/hr over 90 Minutes Intravenous Every 24 hours 01/20/15 1519 01/21/15 1048   01/20/15 0000  piperacillin-tazobactam (ZOSYN) IVPB 3.375 g  Status:  Discontinued     3.375 g 12.5 mL/hr over 240 Minutes Intravenous Every 8 hours 01/19/15 1946 01/29/15 0845   01/19/15 1715  vancomycin (VANCOCIN) 2,500 mg in sodium chloride 0.9 % 500 mL IVPB     2,500 mg 250 mL/hr over 120 Minutes Intravenous  Once  01/19/15 1707 01/19/15 2159   01/19/15 1715  piperacillin-tazobactam (ZOSYN) IVPB 3.375 g     3.375 g 100 mL/hr over 30 Minutes Intravenous  Once 01/19/15 1707 01/19/15 1900      Assessment/Plan: GB fossa hematoma ARF on CKD Anemia chronic disease Malnutrition Ileus  No family at bedside.   Has had several episodes of emesis past several days. No significant SB dilation on CTs. Appears more c/w with ileus.   Cont TPN. Even though has ileus, still think pt will need G tube for eventual enteral feeds. Ok with trial of reglan - will defer to [facility]. Ok with a panda as well. But will need g tube for eventual placement. Will try to find family and discuss.   hgb stable   ADDENDUM: pt apparently vomited after i rounded. Got call from CCM. Concern pt may have aspirated. They are checking cxr/abg. They are going to discuss with CCM. resp status marginal per Dr Kendrick Fries. Defer to [facility]m.  Given 3rd episode of vomiting - will place ng to LIWS.  Awaiting call from IR about possibility of TPA thru drain today. Holding IV heparin for now.   [name]. [name], MD, FACS General, Bariatric, & Minimally Invasive Surgery [facility], [place]   LOS: 22 days    [name] 02/10/2015

## 2015-02-10 NOTE — Progress Notes (Signed)
RT Note: Patient was ordered an ABG by the MD. The unit respiratory therapist Aundra Millet, had stuck the patient twice on the right side earlier but was unsuccessful. The patients RN, Angelique Blonder assisted Aundra Millet earlier and held the patients arm so that the blood could be drawn. Megan then called me to come and attempt the lab draw. I identified the patient via his arm band on the right side and then performed the procedure on that right side and successfully obtained the blood. While holding pressure post lab draw, I noticed an arm band that was buried under the other two that he had on that arm as well as the restraint and then saw that he had a restricted arm band on that arm. I notified the RN, Angelique Blonder that it was a restricted arm and she stated that it was fine and would not cause any harm. No harm came to the patient and no distress was noted. Rt will continue to monitor.

## 2015-02-10 NOTE — Progress Notes (Addendum)
PROGRESS NOTE    Larry [name] DGL:[id number] DOB: 29-Sep-1945 DOA: 01/19/2015 PCP: [name]  HPI/Brief narrative 69 year old male with PMH of A. fib on eliquis, HTN, HLD, 2, initially hospitalized 10/05/14 with sepsis due to acute cholecystitis, PC drain by IR on 10/06/14, DC'ed home on 10/11/2014. He was readmitted 12/30/2014 and underwent Lap cholecystectomy with IOC on 12/30/14. He presented to [facility] [place] on 01/19/15 with abdominal pain and was transferred and admitted to [facility] by general surgery for septic shock related to infected hematoma. Off pressors since 01/21/15. Has RUQ drain in place and on IV Zosyn. IR, CCS and ID continued to follow. Drain upsized 02/04/15. Status post 3 units PRBC for suspected slow intraperitoneal hemorrhage from cholecystectomy site. Developed acute renal failure, nephrology consulted and started CRRT on 02/08/15. Primary care transferred to [facility] starting 02/08/15. Currently issues with delirium, worsening leukocytosis, concern for an ongoing slow intraperitoneal hemorrhage. Palliative care consulted 8/24 for goals of care. Patient remains critically ill and at high risk for decline including respiratory failure requiring ventilatory support. Dr. Kendrick Fries, PCCM discussed with me and will take over primary care role. TRH will sign off 8/25   Assessment/Plan:  Septic shock secondary to infected gallbladder fossa hematoma/abscess, at recent lap cholecystectomy site - Off pressors since 01/21/15. - Serial imaging studies as below. - General surgery, infectious disease and interventional radiology continue to follow - Repeat chest x-ray and urine microscopy 8/25 unremarkable. Ascitic fluid (150 mL aspirated by IR on 8/24):842 WBCs, neutrophils 80% and no organisms on Gram stain-culture pending. - Antibiotics were changed from IV Zosyn to meropenem on 8/24 - RUQ drain placed and upsized 02/04/15.Discussed with Dr. [name], CCS-he is planning to discuss  with IR regarding TPA of drain on 8/25. Heparin IV infusion (on for A. fib) to be temporarily held due to increased bleeding risk after TPA.  Acute renal failure with uremia - Secondary to prolonged ATN, sepsis - Nephrology consulted and started CRRT on 8/23. Renal functions improving. Discussed with nephrology 8/25.  Anemia of chronic disease/acute blood loss anemia. -  Secondary to slow Intraperitoneal bleeding and critical illness - Status post 3 units PRBCs thus far. Follow CBCs and transfuse if hemoglobin less than 7 g per DL. - Continue Aranesp & Fereheme - Hemoglobin stable.  Yeast UTI/CAUTI - Completed a week of fluconazole on 8/24-DC'ed  Acute respiratory failure with hypoxia - Possibly from volume overload, pulmonary edema related to aggressive IV fluid resuscitation for septic shock. - As discussed with CCM, patient at high risk for decline including need for intubation. CCM will take over primary care role.   Anasarca - Related to hypoalbuminemia, IV fluid resuscitation and renal failure. - Volume management across dialysis.  Delirium/uremia - Altered mental status most likely secondary to ongoing acute infection, uremia. Not sure if patient has underlying dementia. - Continues to be confused, restless and intermittently agitated.   Paroxysmal Atrial fibrillation - On eliquis at home. Currently on IV heparin infusion which may have to be held if he has persistent drop in his hemoglobin related to intraperitoneal hemorrhage. - Controlled ventricular rate. - Heparin being held today for TPA of abdominal drain.   DM 2, uncontrolled - A1c 8.2. - Continue Lantus and SSI. Reasonable inpatient control.  Body mass index is 42.93 kg/(m^2)./severe protein calorie malnutrition - Issues with nausea, poor oral intake. Patient currently on TNA. Plans to place PEG tube possibly 8/26 as discussed with CCS today.   Functional quadriplegia -  Secondary to acute illness and prolonged  hospitalization. Will need SNF at discharge.  Hypokalemia/hypomagnesemia - Replace as needed.  Essential hypertension - Controlled  Possible ileus - Patient apparently had couple of episodes of nausea and vomiting. Plans for NG tube drainage and Reglan for CCS.   DVT prophylaxis: On IV heparin anticoagulation - to be held for reasons above. SCDs Code Status: Full Family Communication: discussed with spouse on 02/10/2015. Updated care in detail and answered questions. Disposition Plan: not medically stable for discharge anytime soon. Remains in stepdown unit.   Consultants:  General surgery  Interventional radiology  Infectious disease  Nephrology  Palliative care medicine  Critical care medicine  Procedures:  RUQ drain  RUE PICC line  Foley catheter  HD catheter placement in left IJ on 8/23  Paracentesis by IR on 8/24   Antibiotics:  Fluconazole 8/15-8/23  IV Zosyn 8/3 > 8/24   IV vancomycin 8/3 > 8/6   IV meropenem 8/24 >  Subjective:  Confused. Intermittently restless and agitated. No history available from patient. As per nursing, mental status as stated. As per CCS, couple of episodes of nausea and vomiting. Attempted to pull out HD catheter last night.   Objective: Filed Vitals:   02/10/15 0800 02/10/15 0900 02/10/15 1000 02/10/15 1100  BP: 114/63 164/73 122/90 109/65  Pulse: 103 103 39 102  Temp: 97.8 F (36.6 C)     TempSrc: Oral     Resp: 29 29 28 27   Height:      Weight:      SpO2: 94% 96% 88% 95%    Intake/Output Summary (Last 24 hours) at 02/10/15 1220 Last data filed at 02/10/15 1200  Gross per 24 hour  Intake 2390.84 ml  Output   5602 ml  Net -3211.16 ml   Filed Weights   02/08/15 0800 02/09/15 0500 02/10/15 0500  Weight: 124.8 kg (275 lb 2.2 oz) 124.7 kg (274 lb 14.6 oz) 120.6 kg (265 lb 14 oz)     Exam:  General exam: moderately built, obese, ill looking middle-aged male lying propped up in bed. Fidgety and restless  at times.  Respiratory system: reduced breath sounds in the bases with occasional basal crackles but otherwise clear to auscultation. No increased work of breathing. Cardiovascular system: S1 & S2 heard, RRR. No JVD, murmurs, gallops, clicks.2+ bilateral leg edema-decreasing Darrel Hoover.  Telemetry: Sinus tachycardia in the low 100s.  Gastrointestinal system: Abdomen is mildly distended, soft and nontender. Normal bowel sounds heard. RUQ drain intact. Foley catheter in place.  Central nervous system: somnolent but easily arousable and oriented only to self. No focal neurological deficits. Extremities: Symmetric 5 x 5 power.RUE PICC line.    Data Reviewed: Basic Metabolic Panel:  Recent Labs Lab 02/06/15 0425 02/07/15 0352 02/07/15 1642 02/08/15 0540 02/09/15 0400 02/09/15 1600 02/10/15 0452  NA 136 134* 133* 131* 132* 133* 134*  K 3.6 3.9 4.2 3.9 4.0 4.1 4.0  CL 99* 98* 96* 94* 97* 97* 99*  CO2 27 24 26 24 26 26 25   GLUCOSE 117* 79 133* 227* 156* 142* 152*  BUN 60* 60* 65* 66* 44* 34* 30*  CREATININE 4.63* 5.25* 5.83* 6.42* 4.26* 3.40* 2.98*  CALCIUM 8.5* 8.6* 8.4* 8.4* 8.2* 8.2* 8.3*  MG 2.0 2.1  --  2.0 2.1  --  2.2  PHOS 5.5* 5.5* 5.7* 5.8* 3.4  3.5 2.7 2.2*   Liver Function Tests:  Recent Labs Lab 02/06/15 0425 02/07/15 0352 02/07/15 1642 02/08/15 0540 02/09/15 0400 02/09/15  1600 02/10/15 0452  AST 20 20  --  58*  --   --  78*  ALT 13* 11*  --  19  --   --  37  ALKPHOS 87 99  --  131*  --   --  138*  BILITOT 1.0 1.4*  --  1.0  --   --  1.3*  PROT 6.5 6.5  --  6.6  --   --  7.3  ALBUMIN 2.2* 2.4* 2.0* 2.1* 2.3* 2.2* 2.3*   No results for input(s): LIPASE, AMYLASE in the last 168 hours. No results for input(s): AMMONIA in the last 168 hours. CBC:  Recent Labs Lab 02/04/15 0240 02/05/15 0630 02/06/15 0425 02/07/15 0352 02/08/15 0540 02/09/15 0400 02/10/15 0452  WBC 14.5* 13.3* 14.4* 14.3* 17.4* 21.2* 20.1*  NEUTROABS 12.7* 11.5* 12.3* 12.1*  --   --   --    HGB 7.8* 7.3* 7.5* 7.6* 6.8* 7.7* 7.9*  HCT 23.9* 22.4* 23.0* 23.7* 21.7* 24.1* 24.5*  MCV 89.2 90.3 90.2 91.2 89.3 88.6 89.4  PLT 360 369 372 390 330 344 367   Cardiac Enzymes: No results for input(s): CKTOTAL, CKMB, CKMBINDEX, TROPONINI in the last 168 hours. BNP (last 3 results) No results for input(s): PROBNP in the last 8760 hours. CBG:  Recent Labs Lab 02/09/15 1541 02/09/15 1943 02/10/15 0033 02/10/15 0518 02/10/15 0742  GLUCAP 140* 149* 152* 140* 158*    Recent Results (from the past 240 hour(s))  Gram stain     Status: None   Collection Time: 02/09/15  2:10 PM  Result Value Ref Range Status   Specimen Description FLUID PERITONEAL  Final   Special Requests BOTTLES DRAWN AEROBIC AND ANAEROBIC 10CC  Final   Gram Stain   Final    ABUNDANT WBC PRESENT,BOTH PMN AND MONONUCLEAR NO ORGANISMS SEEN Performed at [facility]    Report Status 02/09/2015 FINAL  Final         Studies: US Paracentesis  02/09/2015   INDICATION: ascites  EXAM: ULTRASOUND-GUIDED PARACENTESIS--Bedside/portable  COMPARISON:  None.  MEDICATIONS: 100 cc 1% lidocaine  COMPLICATIONS: None immediate  TECHNIQUE: Informed written consent was obtained from the patient after a discussion of the risks, benefits and alternatives to treatment. A timeout was performed prior to the initiation of the procedure.  Initial ultrasound scanning demonstrates a large amount of ascites within the right lower abdominal quadrant. The right lower abdomen was prepped and draped in the usual sterile fashion. 1% lidocaine with epinephrine was used for local anesthesia. Under direct ultrasound guidance, a 19 gauge, 7-cm, Yueh catheter was introduced. An ultrasound image was saved for documentation purposed. The paracentesis was performed. The catheter was removed and a dressing was applied. The patient tolerated the procedure well without immediate post procedural complication.  FINDINGS: A total of approximately 150 cc of  bloody appearing fluid was removed. Samples were sent to the laboratory as requested by the clinical team.  IMPRESSION: Successful ultrasound-guided paracentesis yielding 150 cc of peritoneal fluid.  Read by:  Robet Leu [facility]   Electronically Signed   By: Simonne Come M.D.   On: 02/09/2015 15:13   Dg Chest Port 1 View  02/10/2015   CLINICAL DATA:  Projectile vomiting and difficulty breathing  EXAM: PORTABLE CHEST - 1 VIEW  COMPARISON:  02/08/2015  FINDINGS: Left jugular central line and right-sided PICC line are again identified and stable. The overall inspiratory effort is poor. The left lung is clear. Elevation of the right  hemidiaphragm is noted. Mild vascular crowding is seen. Cardiac shadow is enlarged.  IMPRESSION: Poor inspiratory effort with vascular crowding. No focal confluent infiltrate is seen.   Electronically Signed   By: Alcide Clever M.D.   On: 02/10/2015 11:35        Scheduled Meds: . sodium chloride   Intravenous Once  . sodium chloride   Intravenous Once  . antiseptic oral rinse  7 mL Mouth Rinse BID  . darbepoetin (ARANESP) injection - NON-DIALYSIS  200 mcg Subcutaneous Q Wed-1800  . diphenhydrAMINE  12.5 mg Intravenous Once  . insulin aspart  0-15 Units Subcutaneous 6 times per day  . insulin glargine  35 Units Subcutaneous QHS  . megestrol  40 mg Oral Daily  . meropenem (MERREM) IV  500 mg Intravenous Q8H  . nystatin   Topical TID  . pantoprazole (PROTONIX) IV  40 mg Intravenous Q24H  . sodium chloride  10-40 mL Intracatheter Q12H  . sodium phosphate  Dextrose 5% IVPB  20 mmol Intravenous Once   Continuous Infusions: . sodium chloride 10 mL/hr at 01/31/15 0508  . heparin Stopped (02/10/15 1100)  . dialysis replacement fluid (prismasate) 500 mL/hr at 02/10/15 0016  . dialysis replacement fluid (prismasate) 300 mL/hr at 02/08/15 1226  . dialysate (PRISMASATE) 1,800 mL/hr at 02/10/15 0634  . TPN (CLINIMIX) Adult without lytes 75 mL/hr at 02/10/15 1200  . TPN  (CLINIMIX) Adult without lytes      Principal Problem:   Septic shock Active Problems:   Anticoagulated   Paroxysmal a-fib   Acute blood loss anemia   Catheter-associated urinary tract infection   Intra-abdominal infection   Candidiasis of urogenital site   Acute respiratory failure with hypoxia   Infected hematoma following procedure   Leukocytosis   Diabetes mellitus with renal complications   Anemia of chronic disease   Hypokalemia   Hypomagnesemia   Acute renal failure   Protein-calorie malnutrition, severe   AKI (acute kidney injury)   Encounter for palliative care    Time spent: 40 minutes.    Marcellus Scott, MD, FACP, FHM. [facility] Pager [phone]  If 7PM-7AM, please contact night-coverage [email or web address] Password [email or web address] 02/10/2015, 12:20 PM    LOS: 22 days

## 2015-02-10 NOTE — Progress Notes (Signed)
eLink Physician-Brief Progress Note Patient Name: Larry [name] DOB: 1945-10-19 MRN: [id number]   Date of Service  02/10/2015  HPI/Events of Note  Patient with resp distress and sats in the 80s.  Increased WOB with wheezing.  Camera check shows patient to be distressed, moving about in bed with audible wheezing, RR of 32, sats of 86% on [place].  HD on CRRT.  PCXR earlier today shows poor inspiration, vascular crowding.  eICU Interventions  Plan: Nebs Stat ABG FM O2 PCCM to evaluate at bedside     Intervention Category Intermediate Interventions: Respiratory distress - evaluation and management  Aniel Hubble 02/10/2015, 11:53 PM

## 2015-02-10 NOTE — Progress Notes (Signed)
Nutrition Follow-up  DOCUMENTATION CODES:   Severe malnutrition in context of acute illness/injury, Morbid obesity  INTERVENTION:  - Continue advancement of TPN rate per pharmacy - If Panda vs PEG able to be placed, recommend Nepro @ 50 mL/hr with 30 mL Prostat TID which will provide 2460 kcal, 142 grams protein, and 872 mL free water - RD will continue to monitor for needs  NUTRITION DIAGNOSIS:   Inadequate oral intake related to lethargy/confusion as evidenced by meal completion < 25%. -ongoing  GOAL:   Patient will meet greater than or equal to 90% of their needs -currently unmet  MONITOR:   Weight trends, Labs, I & O's, Other (Comment) (TPN regimen)  ASSESSMENT:   69 y/o admitted in 10/05/2012 with Sepsis, hypotension, acute renal failure and acute cholecystitis. He had been place on Eliquis for his AF by the [place]. He had an MI 2 years ago and did not follow up with his cardiologist. He was acutely ill and underwent a percutaneous drain placement on 10/06/14 by IR. He went home on 10/11/14. He was readmitted on 12/30/14 and underwent laparoscopic cholecystectomy with IOC. He was left with a drain in place and the site had "SNOW' placed in the GB fossa for hemostasis after the procedure. Drain was removed last week but there was some brusing at the site. He is transferred from [facility] with complaints of increased abdominal pain.  8/25 Pt currently on Clinimix 5/15 @ 75 mL/hr without lipids which is providing 90 grams protein, 1278 kcal and is not meeting needs. Will d/c Boost Breeze as pt not appropriate for PO intakes at this time.  Spoke with pharmacist who reports plan to increase to goal rate today: Clinimix 5/15 @ 110 mL/hr without lipids, per ICU protocol. This will provide 132 grams protein, 1874 kcal (100% protein and 78% minimum kcal needs). Plan at this time not to add lytes back to TPN; therefore, unable to administer Clinimix E 5/20.  TF recommendations outlined above  should Panda vs PEG be able to be placed. Pt remains agitated with attempts of biting staff, pulling at lines, and scooting self down in bed despite soft wrist restraints.   Not meeting needs. Medications reviewed. Labs reviewed; CBGs: 117-166 mg/dL, Na: 161 mmol/L, Cl: 99 mmol/L, BUN/creatinine elevated, Ca: 8.3 mg/dL, Phos: 2.2 mg/dL, GFR: 20; renal function continues to improve on CRRT and Phos now low, will monitor for repletion outside of TPN.    8/24 - Pt continue with Clinimix 5/15 @ 40 mL/hr without lipids.  - Spoke with pharmacist who reports plan to increase to 75 mL/hr today (this would provide 90 grams protein, 1278 kcal). He states overall goal rate of Clinimix 5/15 @ 110 mL/hr (this will provide 132 grams protein, 1874 kcal); pending nephrology input concerning adding lytes back into TPN. 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  - Possible PEG placement per radiology note; TF recommendations as outlined above.  8/23 - PICC placed yesterday - Pt currently receiving Clinimix 5/15 @ 40 mL/hr which is providing 48 grams protein, 682 kcal.  - Pt received one bag of lipids which was hung prior to transition to ICU status.  - Discussed pt with pharmacist and he also states plan to add sliding scale insulin to TPN for glycemic control. - Catheter for CRRT placed less than 30 minutes ago and CRRT being initiated at this time. Nutrition needs have been re-calculated due to this change. Needs based  on standard body weight for pt who is morbidly obese - No indicator of inability to utilize the gut to feed pt other than prior episodes of emesis. If Reglan is medically feasible, recommend trail of scheduled Reglan, placement of Panda, and TF as outlined above which will meet 100% needs while limiting fluid.   8/22 - Follow-up for calorie count shows pt consumed 1/2 junior Whopper last night.  - Talked with wife who reports pt sleeps most  of the day every day. - New consult for TPN. Talked with pharmacist who reports that discussion had by MD indicated that TF was discussed but then order for TPN initiation placed due to several instances of emesis. He also states that repeat CT to be done today.  - No prokinect agents ordered; recommend trial of scheduled Reglan to assess if this aids in transit time and resolves emesis with oral/enteral route of nutrition. - PICC not yet in place - Concern for volume overload and refeeding with TPN. Pharmacist states plan to start TPN @ 50% goal rate initially.   8/19 - Pt ate 100% of dinner yesterday which consisted of diet Sprite, deli sandwich with mayo, deli ham, and lettuce, vanilla ice cream, and 8 oz whole milk (578 kcal, 21.5 grams protein). - RN reports pt had nothing for breakfast so far this AM but he did drink 1/2 Boost Breeze (145 kcal, 4.5 grams protein). - Will d/c Prostat as RN states pt has had several instances of refusal of this supplement and will order vanilla Magic Cup BID as pt seems to like vanilla ice cream.  8/15 - Pt's diet advanced to Soft on 8/10 but pt has only been eating 5-10% on average.  - Pt reports abdominal pain which is consistent and not exacerbated by intakes.  - Family member asks about good options on Soft diet.  - Will order supplements; feel pt will do better with a clear liquid supplement at this time and will order Prostat to increase protein. - If poor intakes persist, recommend Panda tube with Vital AF 1.2 @ 60 mL/hr to provide 1728 kcal (97% minimal needs), 108 grams protein, and 1168 mL free water   Diet Order:  DIET SOFT Room service appropriate?: Yes; Fluid consistency:: Thin TPN (CLINIMIX) Adult without lytes  Skin:  Wound (see comment) (abdominal incision)  Last BM:  8/24  Height:   Ht Readings from Last 1 Encounters:  01/20/15  (1.676 m)    Weight:   Wt Readings from Last 1 Encounters:  02/10/15 265 lb 14 oz (120.6  kg)    Ideal Body Weight:  64.54 kg (kg)  BMI:  Body mass index is 42.93 kg/(m^2).  Estimated Nutritional Needs:   Kcal:  2400-2800  Protein:  120-160 grams  Fluid:  1-1.3 L/day  EDUCATION NEEDS:   No education needs identified at this time     Trenton Gammon, RD, LDN Inpatient Clinical Dietitian Pager # [phone] After hours/weekend pager # [phone]

## 2015-02-10 NOTE — Progress Notes (Signed)
RN entered patients room to find him scooting himself down in the bed with his feet hanging over the edge of the bottom of the bed. The patient was still in soft wrist restraints, but appeared to be using them to help slide his body down in the bed. The patients CRRT lines were pulled tight and his HD Catheter was pulled from position approximately 3 inches. The clip remained sutured in place, but the head of the catheter was free from the clip. The CRRT machine was functioning without any issues and the antimicrobial disc was still in place at insertion site. NP with CCM was made aware and came to the bedside to assess the HD catheter. Due to the patients improving renal labs, and the patency of the catheter, coupled with the patients persistent agitation and delirium, the decision was made to redress the site and monitor for any changes. The NP on call advised the RN to notify him of any changes or issues with the catheter throughout the night and they would assess the need to change the line in the morning. The area was dressed, using sterile technique, with tegaderm to assist in keeping the exposed catheter in place and covered with a sterile central line dressing. The patient was repositioned comfortably in bed and RN ensured that the CRRT lines were not taught. Will continue to monitor throughout the night. Bosie Helper, RN

## 2015-02-10 NOTE — Progress Notes (Signed)
Physical Therapy Discharge Patient Details Name: Larry [name] MRN: [id number] DOB: October 13, 1945 Today's Date: 02/10/2015 Time:  -     Patient discharged from PT services secondary to medical decline - will need to re-order PT to resume therapy services.  Please see latest therapy progress note for current level of functioning and progress toward goals.    Progress and discharge plan discussed with RN>GP     Rada Hay 02/10/2015, 12:58 PM Blanchard Kelch PT [phone]

## 2015-02-10 NOTE — Progress Notes (Addendum)
OT Cancellation Note  Patient Details Name: Larry [name] MRN: [id number] DOB: [birth year]   Cancelled Treatment:    Reason Eval/Treat Not Completed: Fatigue/lethargy limiting ability to participate  Marcee Jacobs 02/10/2015, 7:02 AM  Marica Otter, OTR/L [phone] 02/10/2015
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
## 2015-02-10 NOTE — Progress Notes (Addendum)
Daily Progress Note   Patient Name: Larry [name]       Date: 02/10/2015 DOB: 1945-08-17  Age: 68 y.o. MRN#: [id number] Attending Physician: Elease Etienne, MD Primary Care Physician: [name] Admit Date: 01/19/2015 Life limiting illness: septic shock due to infected hematoma s/p IR drain, protein-caloric malnutrition and aki requiring HD Reason for Consultation/Follow-up: Establishing goals of care  Subjective:  awake, some what less confused " I would like to get in touch with my wife"  Interval Events: No family at bedside Discussed with RN: patient to receive Fentanyl IV PRN.   PLAN: Palliative will follow peripherally. Please call us if we can be of further assistance.   Length of Stay: 22 days  Current Medications: Scheduled Meds:  . sodium chloride   Intravenous Once  . sodium chloride   Intravenous Once  . antiseptic oral rinse  7 mL Mouth Rinse BID  . darbepoetin (ARANESP) injection - NON-DIALYSIS  200 mcg Subcutaneous Q Wed-1800  . diphenhydrAMINE  12.5 mg Intravenous Once  . feeding supplement  1 Container Oral TID BM  . insulin aspart  0-15 Units Subcutaneous 6 times per day  . insulin glargine  35 Units Subcutaneous QHS  . megestrol  40 mg Oral Daily  . meropenem (MERREM) IV  500 mg Intravenous Q8H  . nystatin   Topical TID  . pantoprazole (PROTONIX) IV  40 mg Intravenous Q24H  . sodium chloride  10-40 mL Intracatheter Q12H    Continuous Infusions: . sodium chloride 10 mL/hr at 01/31/15 0508  . heparin 2,400 Units/hr (02/10/15 0800)  . dialysis replacement fluid (prismasate) 500 mL/hr at 02/10/15 0016  . dialysis replacement fluid (prismasate) 300 mL/hr at 02/08/15 1226  . dialysate (PRISMASATE) 1,800 mL/hr at 02/10/15 0634  . TPN (CLINIMIX) Adult without lytes 75 mL/hr at 02/10/15 0800    PRN Meds: acetaminophen, alteplase, fentaNYL (SUBLIMAZE) injection, heparin, heparin, iohexol, levalbuterol, LORazepam, [DISCONTINUED] ondansetron **OR**  ondansetron (ZOFRAN) IV, sodium chloride  Palliative Performance Scale: 20%     Vital Signs: BP 114/63 mmHg  Pulse 103  Temp(Src) 97.8 F (36.6 C) (Oral)  Resp 29  Ht  (1.676 m)  Wt 120.6 kg (265 lb 14 oz)  BMI 42.93 kg/m2  SpO2 94% SpO2: SpO2: 94 % O2 Device: O2 Device: Nasal Cannula O2 Flow Rate: O2 Flow Rate (L/min): 3 L/min  Intake/output summary:  Intake/Output Summary (Last 24 hours) at 02/10/15 0839 Last data filed at 02/10/15 0800  Gross per 24 hour  Intake 2345.17 ml  Output   5024 ml  Net -2678.83 ml   LBM:   Baseline Weight: Weight: 118.6 kg (261 lb 7.5 oz) Most recent weight: Weight: 120.6 kg (265 lb 14 oz)  Physical Exam:  generalized edema Awake this am Less confused S1 S2    Additional Data Reviewed: Recent Labs     02/09/15  0400  02/09/15  1600  02/10/15  0452  WBC  21.2*   --   20.1*  HGB  7.7*   --   7.9*  PLT  344   --   367  NA  132*  133*  134*  BUN  44*  34*  30*  CREATININE  4.26*  3.40*  2.98*     Problem List:  Patient Active Problem List   Diagnosis Date Noted  . Encounter for palliative care   . Protein-calorie malnutrition, severe 02/08/2015  . AKI (acute kidney injury)   . Diabetes mellitus with renal  complications 02/06/2015  . Anemia of chronic disease 02/06/2015  . Hypokalemia 02/06/2015  . Hypomagnesemia 02/06/2015  . Acute renal failure 02/06/2015  . Acute respiratory failure with hypoxia   . Infected hematoma following procedure   . Leukocytosis   . Candidiasis of urogenital site   . Intra-abdominal infection   . Acute blood loss anemia 01/31/2015  . Catheter-associated urinary tract infection 01/31/2015  . Paroxysmal a-fib 01/19/2015  . Anticoagulated 01/06/2015  . Septic shock      Palliative Care Assessment & Plan  Principal Problem:  Septic shock Active Problems:  Anticoagulated  Paroxysmal a-fib  Acute blood loss anemia  Catheter-associated urinary tract infection  Intra-abdominal  infection  Candidiasis of urogenital site  Acute respiratory failure with hypoxia  Infected hematoma following procedure  Leukocytosis  Diabetes mellitus with renal complications  Anemia of chronic disease  Hypokalemia  Hypomagnesemia  Acute renal failure  Protein-calorie malnutrition, severe  AKI (acute kidney injury)  Encounter for palliative care    Code Status:  Full code  Goals of Care:   continue current management.   Desire for further Chaplaincy support:no  3. Symptom Management:   fentanyl IV PRN   4. Palliative Prophylaxis:  Stool Softener: yes   5. Prognosis: Unable to determine  5. Discharge Planning: pending further clinical course    Care plan was discussed with  Patient, bedside RN, Dr Waymon Amato.   Thank you for allowing the Palliative Medicine Team to assist in the care of this patient.   Time In: 0800 Time Out: 0825 Total Time 25 min Prolonged Time Billed  no     Greater than 50%  of this time was spent counseling and coordinating care related to the above assessment and plan.  Rayetta Veith Gwenlyn Saran, MD  02/10/2015, 8:39 AM  [phone]  Please contact Palliative Medicine Team phone at [phone] for questions and concerns.

## 2015-02-10 NOTE — Progress Notes (Signed)
PT Cancellation Note  Patient Details Name: Larry [name] MRN: [id number] DOB: Dec 06, 1945   Cancelled Treatment:    Reason Eval/Treat Not Completed: Medical issues which prohibited therapy, on CRRT, delirium, agitated. May  Need to consider discharging from therapy due to medical status. Will check back tomorrow.   Rada Hay 02/10/2015, 7:11 AM Blanchard Kelch PT [phone]

## 2015-02-10 NOTE — Progress Notes (Signed)
[name] Progress Note   Subjective: on CRRT, tolerating 150 cc/ hr UF w stable BP/ HR, still confused, labs reviewed and ok, no sig filter clotting  Filed Vitals:   02/10/15 0500 02/10/15 0600 02/10/15 0700 02/10/15 0800  BP: 143/73 105/69 131/69 114/63  Pulse: 104 102 101 103  Temp:    97.8 F (36.6 C)  TempSrc:    Oral  Resp: 22 20 33 29  Height:      Weight: 120.6 kg (265 lb 14 oz)     SpO2: 96% 96% 95% 94%   Exam: Pale, still confused No jvd Chest clear ant / lat RRR no MRG Abd soft ntnd +BS, 3+ ascites, RUQ drain 2 + pitting edema bilat LE's from feet to hips/ scrotum, slightly better Neuro sedated, lethargic, disoriented  CXR 8/20 no acute changes UA renal 11-12 cm, no hydro, normal echo     Assessment: 1 AKI prolonged ATN - D#3 CRRT, labs better, still confused 2 Anemia s/p prbc's 3 RUQ abscess on AB + drain, per ID/ surg/ prim 4 Obesity 5 DM2  6 Nutrition onTNA 7 Volume excess / anasarca/ ascites - some improvement  Plan - cont CRRT, ^UF 200 cc/hr as Caryl Pina MD  pager [phone]    cell [phone]  02/10/2015, 9:34 AM     Recent Labs Lab 02/09/15 0400 02/09/15 1600 02/10/15 0452  NA 132* 133* 134*  K 4.0 4.1 4.0  CL 97* 97* 99*  CO2 GLUCOSE 156* 142* 152*  BUN 44* 34* 30*  CREATININE 4.26* 3.40* 2.98*  CALCIUM 8.2* 8.2* 8.3*  PHOS 3.4  3.5 2.7 2.2*    Recent Labs Lab 02/07/15 0352  02/08/15 0540 02/09/15 0400 02/09/15 1600 02/10/15 0452  AST 20  --  58*  --   --  78*  ALT 11*  --  19  --   --  37  ALKPHOS 99  --  131*  --   --  138*  BILITOT 1.4*  --  1.0  --   --  1.3*  PROT 6.5  --  6.6  --   --  7.3  ALBUMIN 2.4*  < > 2.1* 2.3* 2.2* 2.3*  < > = values in this interval not displayed.  Recent Labs Lab 02/05/15 0630 02/06/15 0425 02/07/15 0352 02/08/15 0540 02/09/15 0400 02/10/15 0452  WBC 13.3* 14.4* 14.3* 17.4* 21.2* 20.1*  NEUTROABS 11.5* 12.3* 12.1*  --   --   --   HGB 7.3*  7.5* 7.6* 6.8* 7.7* 7.9*  HCT 22.4* 23.0* 23.7* 21.7* 24.1* 24.5*  MCV 90.3 90.2 91.2 89.3 88.6 89.4  PLT 369 372 390 330 344 367   . sodium chloride   Intravenous Once  . sodium chloride   Intravenous Once  . antiseptic oral rinse  7 mL Mouth Rinse BID  . darbepoetin (ARANESP) injection - NON-DIALYSIS  200 mcg Subcutaneous Q Wed-1800  . diphenhydrAMINE  12.5 mg Intravenous Once  . insulin aspart  0-15 Units Subcutaneous 6 times per day  . insulin glargine  35 Units Subcutaneous QHS  . megestrol  40 mg Oral Daily  . meropenem (MERREM) IV  500 mg Intravenous Q8H  . nystatin   Topical TID  . pantoprazole (PROTONIX) IV  40 mg Intravenous Q24H  . sodium chloride  10-40 mL Intracatheter Q12H   . sodium chloride 10 mL/hr at 01/31/15 0508  . heparin 2,400 Units/hr (02/10/15 0900)  .  dialysis replacement fluid (prismasate) 500 mL/hr at 02/10/15 0016  . dialysis replacement fluid (prismasate) 300 mL/hr at 02/08/15 1226  . dialysate (PRISMASATE) 1,800 mL/hr at 02/10/15 0634  . TPN (CLINIMIX) Adult without lytes 75 mL/hr at 02/10/15 0900   acetaminophen, alteplase, fentaNYL (SUBLIMAZE) injection, heparin, heparin, iohexol, levalbuterol, LORazepam, [DISCONTINUED] ondansetron **OR** ondansetron (ZOFRAN) IV, sodium chloride

## 2015-02-10 NOTE — Progress Notes (Signed)
Attempted follow up with family for continued support around pt illness.      Family not present at bedside.  Will continue to follow in attempt to make contact with family Please page if family is present.    Burnis Kingfisher [place] MDiv   [phone]

## 2015-02-10 NOTE — Progress Notes (Signed)
OT Cancellation Note  Patient Details Name: Larry [name] MRN: [id number] DOB: 1946-03-21   Cancelled Treatment:    Reason Eval/Treat Not Completed: Medical issues which prohibited therapy. Pt has had medical decline and is confused. Will sign off.  Please reorder later if pt will benefit from OT. Thank you.  Aryon Nham 02/10/2015, 10:00 AM  Marica Otter, OTR/L [phone] 02/10/2015

## 2015-02-10 NOTE — Progress Notes (Addendum)
[facility]    Date of Admission:  01/19/2015   Total days of antibiotics 22        Day 2 meropenem   ID: Larry [name] is a 69 y.o. male with septic shock due to infected hematoma s/p IR drain, protein-caloric malnutrition and aki requiring HD Principal Problem:   Septic shock Active Problems:   Anticoagulated   Paroxysmal a-fib   Acute blood loss anemia   Catheter-associated urinary tract infection   Intra-abdominal infection   Candidiasis of urogenital site   Acute respiratory failure with hypoxia   Infected hematoma following procedure   Leukocytosis   Diabetes mellitus with renal complications   Anemia of chronic disease   Hypokalemia   Hypomagnesemia   Acute renal failure   Protein-calorie malnutrition, severe   AKI (acute kidney injury)   Encounter for palliative care   Acute encephalopathy    Subjective: Afebrile, remains on CRRT, predominantly confused all day. Continues to be encephalopathic, attempted to pull out HD line. Had bilious vomiting, concerning for ileus  Medications:  . Alteplase 6 mg in 30 cc NS catheter dwell   Intracatheter Once  . antiseptic oral rinse  7 mL Mouth Rinse BID  . darbepoetin (ARANESP) injection - NON-DIALYSIS  200 mcg Subcutaneous Q Wed-1800  . diphenhydrAMINE  12.5 mg Intravenous Once  . insulin aspart  0-15 Units Subcutaneous 6 times per day  . insulin glargine  35 Units Subcutaneous QHS  . meropenem (MERREM) IV  500 mg Intravenous Q8H  . metoCLOPramide (REGLAN) injection  5 mg Intravenous 3 times per day  . nystatin   Topical TID  . pantoprazole (PROTONIX) IV  40 mg Intravenous Q24H  . sodium chloride  10-40 mL Intracatheter Q12H  . sodium phosphate  Dextrose 5% IVPB  20 mmol Intravenous Once    Objective: Vital signs in last 24 hours: Temp:  [97.8 F (36.6 C)-98.5 F (36.9 C)] 97.8 F (36.6 C) (08/25 0800) Pulse Rate:  [39-109] 50 (08/25 1400) Resp:  [19-40] 32 (08/25 1400) BP:  (97-164)/(31-90) 98/59 mmHg (08/25 1400) SpO2:  [88 %-97 %] 92 % (08/25 1400) Weight:  [265 lb 14 oz (120.6 kg)] 265 lb 14 oz (120.6 kg) (08/25 0500)  Physical Exam  Constitutional: He appears chronically ill.sleeping, NG in place Neck: left HD cath IJ Cardiovascular: Normal rate, regular rhythm and normal heart sounds. Exam reveals no gallop and no friction rub.  No murmur heard.  Pulmonary/Chest: Effort normal and breath sounds normal. No respiratory distress. He has no wheezes.  Abdominal: Soft. Absent BS Ext: 2+ edema Skin: Skin is warm and dry. No rash noted. No erythema.     Lab Results  Recent Labs  02/09/15 0400 02/09/15 1600 02/10/15 0452  WBC 21.2*  --  20.1*  HGB 7.7*  --  7.9*  HCT 24.1*  --  24.5*  NA 132* 133* 134*  K 4.0 4.1 4.0  CL 97* 97* 99*  CO2 26 26 25   BUN 44* 34* 30*  CREATININE 4.26* 3.40* 2.98*   Liver Panel  Recent Labs  02/08/15 0540  02/09/15 1600 02/10/15 0452  PROT 6.6  --   --  7.3  ALBUMIN 2.1*  < > 2.2* 2.3*  AST 58*  --   --  78*  ALT 19  --   --  37  ALKPHOS 131*  --   --  138*  BILITOT 1.0  --   --  1.3*  < > =  values in this interval not displayed.  Studies/Results: US Paracentesis  02/09/2015   INDICATION: ascites  EXAM: ULTRASOUND-GUIDED PARACENTESIS--Bedside/portable  COMPARISON:  None.  MEDICATIONS: 100 cc 1% lidocaine  COMPLICATIONS: None immediate  TECHNIQUE: Informed written consent was obtained from the patient after a discussion of the risks, benefits and alternatives to treatment. A timeout was performed prior to the initiation of the procedure.  Initial ultrasound scanning demonstrates a large amount of ascites within the right lower abdominal quadrant. The right lower abdomen was prepped and draped in the usual sterile fashion. 1% lidocaine with epinephrine was used for local anesthesia. Under direct ultrasound guidance, a 19 gauge, 7-cm, Yueh catheter was introduced. An ultrasound image was saved for documentation  purposed. The paracentesis was performed. The catheter was removed and a dressing was applied. The patient tolerated the procedure well without immediate post procedural complication.  FINDINGS: A total of approximately 150 cc of bloody appearing fluid was removed. Samples were sent to the laboratory as requested by the clinical team.  IMPRESSION: Successful ultrasound-guided paracentesis yielding 150 cc of peritoneal fluid.  Read by:  Robet Leu [facility]   Electronically Signed   By: Simonne Come M.D.   On: 02/09/2015 15:13   Dg Chest Port 1 View  02/10/2015   CLINICAL DATA:  Projectile vomiting and difficulty breathing  EXAM: PORTABLE CHEST - 1 VIEW  COMPARISON:  02/08/2015  FINDINGS: Left jugular central line and right-sided PICC line are again identified and stable. The overall inspiratory effort is poor. The left lung is clear. Elevation of the right hemidiaphragm is noted. Mild vascular crowding is seen. Cardiac shadow is enlarged.  IMPRESSION: Poor inspiratory effort with vascular crowding. No focal confluent infiltrate is seen.   Electronically Signed   By: Alcide Clever M.D.   On: 02/10/2015 11:35     Assessment/Plan:  Intra-abdominal infection with infected hematoma post choelcystectomy = unclear what pathogens are involved in his IAI. Gram stain showed no organisms, but plenty of wbc.  Continue on meropenem and empiric antifungal. Undergoing tpa protocol of drain to see if accelerate drainage of GB fossa hematoma/infection.  aki = continues on CRRT for now. Will renally dose meropenem  Leukocytosis = worsening, no change from adding meropenem. Will add empiric antifungal to see if any improvement. Blood cultures are pending to see if can narrow regimen.  Severe protein-caloric malnutrition = continue with tpn for now  Agree with palliative care consultation, remains critically ill.  Spoke with critical care regarding plan.    Drue Second [facility] for Infectious  Diseases Cell: [phone] Pager: [phone]  02/10/2015, 4:35 PM
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
## 2015-02-10 NOTE — Progress Notes (Signed)
CSW continues to follow this pt to assist with d/c planning. Neither d/c date nor disposition has been determined. CSW will continue to be available to assist with d/c planning needs.  Cori Razor LCSW [phone]

## 2015-02-10 NOTE — Progress Notes (Signed)
PULMONARY / CRITICAL CARE MEDICINE   Name: Larry [name] MRN: [id number] DOB: October 13, 1945    ADMISSION DATE:  01/19/2015 CONSULTATION DATE:  8/4  REFERRING MD :  [name]  CHIEF COMPLAINT:  Septic shock   INITIAL PRESENTATION:  69 year old male w/ CAF on Elliquis who recently underwent lap chole on 7/14. Was discharged home w/drain. Drain removed about a week before presentation to ER. Admitted directly from [facility] on 8/3 w/ septic shock which was likely due to infected hematoma in the GB fossa. PCCM asked to see after new perc drain placed on 8/4, when he remained hypotensive.    STUDIES:  CT abdomen/Pelvis W/ 7/28 - Gas & fluid at cholecystectomy site w/o discrete abscess. Min ascites. Small right pleural eff. CT abdomen/pelvis w/o 8/5 - Gas between liver & stomach. No additional free air. Increased flow void & high density layering c/w increasing hemorrhage as well as increased right pleural eff. CT abdomen/pelvis 8/18 >> 9.8 cm hematoma with gas in the gallbladder fossa, indwelling pigtail drainage catheter, 12.6 cm hematoma along the posterior R liver, mod ascites, mild wall thickening involving the R colon CT abdomen/pelvis 8/22 >> stable perihepatic hematoma, stable gallbladder fossa collection despite adequate drainage cath placement, significant free fluid within the abdomen/pelvis slightly increased from prior exam  SIGNIFICANT EVENTS: Admitted 8/3 w/ abd pain and hypotension  8/04  hypotensive over-night. Went for [facility] drain of GB fossa. Found what looked like old infected hematoma. PCCM asked to see post-op for shock 8/05  off pressors. WOB worse.  8/06  WOB & wheezing slightly worse. Progressing abdominal pain 8/07  Hgb declining w/ increasing intraperitoneal hemorrhage 8/07  Transfused 2u PRBCs 8/09  work of breathing still significant w/ marked upper airway component  8/10  breathing better. 1.6 liters negative. Slight bump in scr. Vanc stopped. Getting OOB for  first time 8/11  eating regular diet. Up in chair. Feeling better.  Respiratory status improved.  PCCM s/o 8/14  Renal consulted for rising sr cr (to 3.60), baseline sr cr 1.1-1.5 (prior IV contrast 7/28).   8/14  Concern for yeast / bacterial UTI  8/15  ID consulted > abx narrowed to zosyn only, fluconazole for yeast  8/16  UOP increased on IV lasix 8/18  NPO, sr cr improving, CT abd as above  8/18  GB drain upsized per IR to 16 Fr 8/19  Abd pain, eating well.  Diarrhea after ensure.  To PO lasix.  Discussed gastrostomy tube with IR 8/22  Sr Cr rising, intermittent vomiting.  CT abd as above.  Increased confusion 8/23  HD cath inserted, CRRT initiated.  Gastrostomy tube held off due to CRRT & Hgb drop 8/24  WBC elevated, cultures repeated.   8/24  Paracentesis >> 150 ml "bloody bile" fluid removed, loculated and difficult to aspirate 8/25  Palliative care consulted, PCCM consulted   SUBJECTIVE:  RN reports green emesis, VSS on CVVHD.  Patient alert but confused.    VITAL SIGNS: Temp:  [97.8 F (36.6 C)-98.5 F (36.9 C)] 97.8 F (36.6 C) (08/25 0800) Pulse Rate:  [39-109] 102 (08/25 1100) Resp:  [20-40] 27 (08/25 1100) BP: (97-164)/(31-92) 109/65 mmHg (08/25 1100) SpO2:  [88 %-97 %] 95 % (08/25 1100) Weight:  [265 lb 14 oz (120.6 kg)] 265 lb 14 oz (120.6 kg) (08/25 0500)  HEMODYNAMICS: CVP:  [13 mmHg-14 mmHg] 13 mmHg   VENTILATOR SETTINGS:     INTAKE / OUTPUT:  Intake/Output Summary (Last 24  hours) at 02/10/15 1218 Last data filed at 02/10/15 1200  Gross per 24 hour  Intake 2390.84 ml  Output   5602 ml  Net -3211.16 ml    PHYSICAL EXAMINATION: General:  Acutely ill adult male lying in bed Neuro:  MAE, speech clear with intermittent appropriate answers HEENT:  MM pink/dry, no jvd, L HD cath c/d/i Cardiovascular:  s1s2 rrr, no m/r/g Lungs:  Mild abdominal accessory muscle usage, 3LNC, lungs bilaterally diminished lower, upper airway wheeze Abdomen:  RUQ perc drain in  place. Hypoactive BS. Protuberant. Musculoskeletal:  No joint effusions or deformities. Skin:  Warm & dry. No rashes or lesions. No abdominal bruising. Trace edema   LABS:  CBC  Recent Labs Lab 02/08/15 0540 02/09/15 0400 02/10/15 0452  WBC 17.4* 21.2* 20.1*  HGB 6.8* 7.7* 7.9*  HCT 21.7* 24.1* 24.5*  PLT 330 344 367   Coag's  Recent Labs Lab 02/08/15 0540 02/09/15 0400 02/10/15 0452  APTT 101* 157* 163*   BMET  Recent Labs Lab 02/09/15 0400 02/09/15 1600 02/10/15 0452  NA 132* 133* 134*  K 4.0 4.1 4.0  CL 97* 97* 99*  CO2 BUN 44* 34* 30*  CREATININE 4.26* 3.40* 2.98*  GLUCOSE 156* 142* 152*   Electrolytes  Recent Labs Lab 02/08/15 0540 02/09/15 0400 02/09/15 1600 02/10/15 0452  CALCIUM 8.4* 8.2* 8.2* 8.3*  MG 2.0 2.1  --  2.2  PHOS 5.8* 3.4  3.5 2.7 2.2*   Sepsis Markers No results for input(s): LATICACIDVEN, PROCALCITON, O2SATVEN in the last 168 hours.   ABG  Recent Labs Lab 02/10/15 1158  PHART 7.411  PCO2ART 39.6  PO2ART 68.7*   Liver Enzymes  Recent Labs Lab 02/07/15 0352  02/08/15 0540 02/09/15 0400 02/09/15 1600 02/10/15 0452  AST 20  --  58*  --   --  78*  ALT 11*  --  19  --   --  37  ALKPHOS 99  --  131*  --   --  138*  BILITOT 1.4*  --  1.0  --   --  1.3*  ALBUMIN 2.4*  < > 2.1* 2.3* 2.2* 2.3*  < > = values in this interval not displayed.   Cardiac Enzymes No results for input(s): TROPONINI, PROBNP in the last 168 hours.   Glucose  Recent Labs Lab 02/09/15 1143 02/09/15 1541 02/09/15 1943 02/10/15 0033 02/10/15 0518 02/10/15 0742  GLUCAP 117* 140* 149* 152* 140* 158*    Imaging US Paracentesis  02/09/2015   INDICATION: ascites  EXAM: ULTRASOUND-GUIDED PARACENTESIS--Bedside/portable  COMPARISON:  None.  MEDICATIONS: 100 cc 1% lidocaine  COMPLICATIONS: None immediate  TECHNIQUE: Informed written consent was obtained from the patient after a discussion of the risks, benefits and alternatives to  treatment. A timeout was performed prior to the initiation of the procedure.  Initial ultrasound scanning demonstrates a large amount of ascites within the right lower abdominal quadrant. The right lower abdomen was prepped and draped in the usual sterile fashion. 1% lidocaine with epinephrine was used for local anesthesia. Under direct ultrasound guidance, a 19 gauge, 7-cm, Yueh catheter was introduced. An ultrasound image was saved for documentation purposed. The paracentesis was performed. The catheter was removed and a dressing was applied. The patient tolerated the procedure well without immediate post procedural complication.  FINDINGS: A total of approximately 150 cc of bloody appearing fluid was removed. Samples were sent to the laboratory as requested by the clinical team.  IMPRESSION: Successful ultrasound-guided paracentesis  yielding 150 cc of peritoneal fluid.  Read by:  Robet Leu [facility]   Electronically Signed   By: Simonne Come M.D.   On: 02/09/2015 15:13   Dg Chest Port 1 View  02/10/2015   CLINICAL DATA:  Projectile vomiting and difficulty breathing  EXAM: PORTABLE CHEST - 1 VIEW  COMPARISON:  02/08/2015  FINDINGS: Left jugular central line and right-sided PICC line are again identified and stable. The overall inspiratory effort is poor. The left lung is clear. Elevation of the right hemidiaphragm is noted. Mild vascular crowding is seen. Cardiac shadow is enlarged.  IMPRESSION: Poor inspiratory effort with vascular crowding. No focal confluent infiltrate is seen.   Electronically Signed   By: Alcide Clever M.D.   On: 02/10/2015 11:35    ASSESSMENT / PLAN:  PULMONARY A: Chronically elevated right HD Acute Hypoxic Respiratory Failure Right pleural effusion - progessing on Abd CT 8/6  P:   Oxygen for saturations > 92% Monitor WOB closely.  ABG assessed 8/25, reviewed and no indication for intubation at present Xopenex neb q6hr PRN  CARDIOVASCULAR CVL Right IJ 8/4>> A:  Severe  sepsis/septic shock - Shock resolved Hx PAF - CHADVASC score 4; currently NSR  Prior NSTEMI   P:  Hold all antihypertensives Monitor in SDU Heparin gtt for hx of PAF  RENAL A:   ARF - in setting of septic shock, prior hypotension/ATN Hyperkalemia - Resolved Hyponatremia  Hypophosphatemia  P:   Monitor UOP / BMP  Replace electrolytes as indicated Nephrology following, CVVHD in progress Daily renal panel   GASTROINTESTINAL A:   Infected hematoma s/p cholecystectomy - Now s/p CT guided Perc drain with subsequent upsize to 16 Fr GERD - worse 8/5 w/ associated UAW wheeze Intra-abdominal hemorrhage / hematoma - Hgb stable now Nausea / Vomiting - concern for ileus (8/25) Protein Calorie Malnutrition   P:   TNA per pharmacy  Protonix QD Reflux precautions NPO Reglan Q8 x4 doses Place NGT to LIS   HEMATOLOGIC A:   Anemia - in setting of intra-abdominal hemorrhage (8/4).  S/p 2 u PRBC on 8/4 & 2u 8/7   P:  Trend CBC  SCDs Transfuse for Hbg < 7  Heparin gtt as above Daily CBC   INFECTIOUS A:   Severe sepsis/septic shock - presume source is infected hematoma from recent Cholecystectomy. Shock resolved.  S/p Perc GB fossa site drain 8/4 - minimal serous output High Risk for Fungemia P:   Trending WBC / Fever curve  ID following, appreciate input   Abscess 8/4>>>neg BCX2 8/4>>>neg  Peritoneal fluid GS 8/24 >>  BCx2 8/25 >>   Meropenem 8/24 >>  Fluconazole 8/15 >> 8/23  Repeat blood cultures, high risk fungemia.  Elevated WBC but afebrile   ENDOCRINE A: DM - CBG's well controlled  P:   SSI Lantus  Accuchecks q4hr  NEUROLOGIC A:  Abdominal Pain/Peritonitis -  Delirium   P:   RASS goal: 0 Supportive care Delirium prevention measures: minimize sedating medications, promote sleep / wake cycle  Fentanyl IV prn for pain  Haldol for agitation, follow Qtc   FAMILY  - Updates:  Wife called and updated by Dr. Kendrick Fries.   - Inter-disciplinary family  meet or Palliative Care meeting due by:  ongoing    Canary Brim, NP-C [facility] Pgr: [phone] or if no answer [phone] 02/10/2015, 12:18 PM  Attending:  I have seen and examined the patient with nurse practitioner/resident and agree with the note  above.   Mr. Meiner chart was reviewed extensively, case discussed with Dr. Waymon Amato as well as Dr. [name].  On exam he will awaken to voice, speech is not clear, slight upper airway wheeze but good air movement, belly is distended, right upper quadrant drain is in place  Acute kidney failure> currently managed with CVVHD per renal  Worsening leukocytosis, and setting of known infected gallbladder fossa complicated by intraperitoneal hemorrhage> no cultures positive, agree with broad-spectrum antibiotics but will reculture because of high risk of fungemia (multiple antibiotics, TPN and indwelling lines)  Respiratory distress, mild> at this time x-rays normal ABG is normal, will attempt to assist breathing by decompressing abdomen with NG tube  Ileus> NG, gentle Reglan  Acute encephalopathy> perhaps a bit better today compared to earlier in the week when we placed his HD cath, complicated by ongoing benzo use as well as narcotics; Discussed with nursing prefer to use anti-psychotics for market agitation, limit benzodiazepine use  I updated wife by phone 8/25  He is critically ill, my cc time 45 minutes  Heber [place], MD [place] PCCM Pager: [phone] Cell: [phone] After 3pm or if no response, call [phone]

## 2015-02-11 ENCOUNTER — Inpatient Hospital Stay (HOSPITAL_COMMUNITY): Payer: Medicare Other | Admitting: Registered Nurse

## 2015-02-11 ENCOUNTER — Inpatient Hospital Stay (HOSPITAL_COMMUNITY): Payer: Medicare Other

## 2015-02-11 DIAGNOSIS — J9601 Acute respiratory failure with hypoxia: Secondary | ICD-10-CM

## 2015-02-11 LAB — POCT I-STAT, CHEM 8
BUN: 29 mg/dL — AB (ref 6–20)
BUN: 34 mg/dL — ABNORMAL HIGH (ref 6–20)
BUN: 28 mg/dL — ABNORMAL HIGH (ref 6–20)
BUN: 34 mg/dL — ABNORMAL HIGH (ref 6–20)
CALCIUM ION: 0.74 mmol/L — AB (ref 1.13–1.30)
CALCIUM ION: 0.62 mmol/L — AB (ref 1.13–1.30)
CALCIUM ION: 1.11 mmol/L — AB (ref 1.13–1.30)
CHLORIDE: 94 mmol/L — AB (ref 101–111)
CREATININE: 2.3 mg/dL — AB (ref 0.61–1.24)
Calcium, Ion: 1.05 mmol/L — ABNORMAL LOW (ref 1.13–1.30)
Chloride: 94 mmol/L — ABNORMAL LOW (ref 101–111)
Chloride: 97 mmol/L — ABNORMAL LOW (ref 101–111)
Chloride: 96 mmol/L — ABNORMAL LOW (ref 101–111)
Creatinine, Ser: 1.8 mg/dL — ABNORMAL HIGH (ref 0.61–1.24)
Creatinine, Ser: 1.7 mg/dL — ABNORMAL HIGH (ref 0.61–1.24)
Creatinine, Ser: 2.2 mg/dL — ABNORMAL HIGH (ref 0.61–1.24)
GLUCOSE: 383 mg/dL — AB (ref 65–99)
GLUCOSE: 390 mg/dL — AB (ref 65–99)
GLUCOSE: 406 mg/dL — AB (ref 65–99)
Glucose, Bld: 362 mg/dL — ABNORMAL HIGH (ref 65–99)
HCT: 31 % — ABNORMAL LOW (ref 39.0–52.0)
HEMATOCRIT: 32 % — AB (ref 39.0–52.0)
HEMATOCRIT: 30 % — AB (ref 39.0–52.0)
HEMATOCRIT: 31 % — AB (ref 39.0–52.0)
HEMOGLOBIN: 10.9 g/dL — AB (ref 13.0–17.0)
HEMOGLOBIN: 10.2 g/dL — AB (ref 13.0–17.0)
HEMOGLOBIN: 10.5 g/dL — AB (ref 13.0–17.0)
HEMOGLOBIN: 10.5 g/dL — AB (ref 13.0–17.0)
POTASSIUM: 4.1 mmol/L (ref 3.5–5.1)
POTASSIUM: 3.9 mmol/L (ref 3.5–5.1)
Potassium: 3.9 mmol/L (ref 3.5–5.1)
Potassium: 3.8 mmol/L (ref 3.5–5.1)
SODIUM: 131 mmol/L — AB (ref 135–145)
SODIUM: 132 mmol/L — AB (ref 135–145)
Sodium: 134 mmol/L — ABNORMAL LOW (ref 135–145)
Sodium: 131 mmol/L — ABNORMAL LOW (ref 135–145)
TCO2: 22 mmol/L (ref 0–100)
TCO2: 21 mmol/L (ref 0–100)
TCO2: 23 mmol/L (ref 0–100)
TCO2: 23 mmol/L (ref 0–100)

## 2015-02-11 LAB — BLOOD GAS, ARTERIAL
ACID-BASE DEFICIT: 4.4 mmol/L — AB (ref 0.0–2.0)
Acid-base deficit: 4.3 mmol/L — ABNORMAL HIGH (ref 0.0–2.0)
Bicarbonate: 20.3 mEq/L (ref 20.0–24.0)
Bicarbonate: 21.1 mEq/L (ref 20.0–24.0)
Drawn by: 235321
Drawn by: 235321
FIO2: 0.35
FIO2: 1
LHR: 12 {breaths}/min
MECHVT: 510 mL
O2 SAT: 99.4 %
O2 Saturation: 87.2 %
PATIENT TEMPERATURE: 97.5
PATIENT TEMPERATURE: 97.7
PCO2 ART: 37.3 mmHg (ref 35.0–45.0)
PCO2 ART: 42 mmHg (ref 35.0–45.0)
PEEP/CPAP: 5 cmH2O
PH ART: 7.319 — AB (ref 7.350–7.450)
PO2 ART: 243 mmHg — AB (ref 80.0–100.0)
PO2 ART: 57.5 mmHg — AB (ref 80.0–100.0)
TCO2: 19.5 mmol/L (ref 0–100)
TCO2: 20.4 mmol/L (ref 0–100)
pH, Arterial: 7.352 (ref 7.350–7.450)

## 2015-02-11 LAB — DIFFERENTIAL
BASOS ABS: 0.2 10*3/uL — AB (ref 0.0–0.1)
Band Neutrophils: 0 % (ref 0–10)
Basophils Relative: 1 % (ref 0–1)
Blasts: 0 %
EOS ABS: 0 10*3/uL (ref 0.0–0.7)
Eosinophils Relative: 0 % (ref 0–5)
LYMPHS PCT: 4 % — AB (ref 12–46)
Lymphs Abs: 0.9 10*3/uL (ref 0.7–4.0)
METAMYELOCYTES PCT: 0 %
MONO ABS: 1.1 10*3/uL — AB (ref 0.1–1.0)
MYELOCYTES: 0 %
Monocytes Relative: 5 % (ref 3–12)
NEUTROS PCT: 90 % — AB (ref 43–77)
Neutro Abs: 20.1 10*3/uL — ABNORMAL HIGH (ref 1.7–7.7)
Other: 0 %
PROMYELOCYTES ABS: 0 %
nRBC: 0 /100 WBC

## 2015-02-11 LAB — RENAL FUNCTION PANEL
ALBUMIN: 2.2 g/dL — AB (ref 3.5–5.0)
ANION GAP: 8 (ref 5–15)
Albumin: 2.2 g/dL — ABNORMAL LOW (ref 3.5–5.0)
Anion gap: 6 (ref 5–15)
BUN: 32 mg/dL — ABNORMAL HIGH (ref 6–20)
BUN: 36 mg/dL — ABNORMAL HIGH (ref 6–20)
CALCIUM: 8 mg/dL — AB (ref 8.9–10.3)
CO2: 23 mmol/L (ref 22–32)
CO2: 25 mmol/L (ref 22–32)
CREATININE: 2.56 mg/dL — AB (ref 0.61–1.24)
Calcium: 8 mg/dL — ABNORMAL LOW (ref 8.9–10.3)
Chloride: 100 mmol/L — ABNORMAL LOW (ref 101–111)
Chloride: 101 mmol/L (ref 101–111)
Creatinine, Ser: 2.56 mg/dL — ABNORMAL HIGH (ref 0.61–1.24)
GFR calc Af Amer: 28 mL/min — ABNORMAL LOW (ref >60)
GFR calc non Af Amer: 24 mL/min — ABNORMAL LOW (ref >60)
GFR, EST AFRICAN AMERICAN: 28 mL/min — AB (ref >60)
GFR, EST NON AFRICAN AMERICAN: 24 mL/min — AB (ref >60)
GLUCOSE: 275 mg/dL — AB (ref 65–99)
Glucose, Bld: 355 mg/dL — ABNORMAL HIGH (ref 65–99)
PHOSPHORUS: 2.5 mg/dL (ref 2.5–4.6)
POTASSIUM: 4.2 mmol/L (ref 3.5–5.1)
Phosphorus: 2.6 mg/dL (ref 2.5–4.6)
Potassium: 4.4 mmol/L (ref 3.5–5.1)
SODIUM: 132 mmol/L — AB (ref 135–145)
Sodium: 131 mmol/L — ABNORMAL LOW (ref 135–145)

## 2015-02-11 LAB — CBC
HEMATOCRIT: 26.2 % — AB (ref 39.0–52.0)
HEMOGLOBIN: 8.5 g/dL — AB (ref 13.0–17.0)
MCH: 29.8 pg (ref 26.0–34.0)
MCHC: 32.4 g/dL (ref 30.0–36.0)
MCV: 91.9 fL (ref 78.0–100.0)
Platelets: 413 10*3/uL — ABNORMAL HIGH (ref 150–400)
RBC: 2.85 MIL/uL — ABNORMAL LOW (ref 4.22–5.81)
RDW: 18.6 % — ABNORMAL HIGH (ref 11.5–15.5)
WBC: 22.3 10*3/uL — ABNORMAL HIGH (ref 4.0–10.5)

## 2015-02-11 LAB — GLUCOSE, CAPILLARY
GLUCOSE-CAPILLARY: 251 mg/dL — AB (ref 65–99)
GLUCOSE-CAPILLARY: 310 mg/dL — AB (ref 65–99)
GLUCOSE-CAPILLARY: 334 mg/dL — AB (ref 65–99)
Glucose-Capillary: 257 mg/dL — ABNORMAL HIGH (ref 65–99)

## 2015-02-11 LAB — MAGNESIUM: MAGNESIUM: 2.2 mg/dL (ref 1.7–2.4)

## 2015-02-11 LAB — APTT: APTT: 37 s (ref 24–37)

## 2015-02-11 MED ORDER — LEVALBUTEROL HCL 0.63 MG/3ML IN NEBU
0.6300 mg | INHALATION_SOLUTION | Freq: Four times a day (QID) | RESPIRATORY_TRACT | Status: DC
  Administered 2015-02-11 – 2015-02-25 (×46): 0.63 mg via RESPIRATORY_TRACT
  Filled 2015-02-11 (×50): qty 3

## 2015-02-11 MED ORDER — METHYLPREDNISOLONE SODIUM SUCC 125 MG IJ SOLR
125.0000 mg | Freq: Once | INTRAMUSCULAR | Status: AC
  Administered 2015-02-11: 125 mg via INTRAVENOUS
  Filled 2015-02-11: qty 2

## 2015-02-11 MED ORDER — DEXMEDETOMIDINE HCL IN NACL 400 MCG/100ML IV SOLN
0.4000 ug/kg/h | INTRAVENOUS | Status: DC
  Administered 2015-02-11: 1.2 ug/kg/h via INTRAVENOUS
  Administered 2015-02-11: 0.4 ug/kg/h via INTRAVENOUS
  Filled 2015-02-11: qty 100
  Filled 2015-02-11 (×2): qty 50

## 2015-02-11 MED ORDER — CHLORHEXIDINE GLUCONATE 0.12 % MT SOLN
OROMUCOSAL | Status: AC
  Filled 2015-02-11: qty 15

## 2015-02-11 MED ORDER — FENTANYL CITRATE (PF) 100 MCG/2ML IJ SOLN
100.0000 ug | Freq: Once | INTRAMUSCULAR | Status: AC
  Administered 2015-02-11: 100 ug via INTRAVENOUS

## 2015-02-11 MED ORDER — DEXMEDETOMIDINE HCL IN NACL 400 MCG/100ML IV SOLN
0.4000 ug/kg/h | INTRAVENOUS | Status: DC
  Administered 2015-02-11: 1.2 ug/kg/h via INTRAVENOUS
  Administered 2015-02-11: 0.4 ug/kg/h via INTRAVENOUS
  Administered 2015-02-11: 1 ug/kg/h via INTRAVENOUS
  Administered 2015-02-11: 0.7 ug/kg/h via INTRAVENOUS
  Administered 2015-02-12 (×2): 1 ug/kg/h via INTRAVENOUS
  Filled 2015-02-11 (×6): qty 100

## 2015-02-11 MED ORDER — MIDAZOLAM HCL 2 MG/2ML IJ SOLN
INTRAMUSCULAR | Status: AC
  Administered 2015-02-11: 2 mg via INTRAVENOUS
  Filled 2015-02-11: qty 2

## 2015-02-11 MED ORDER — SUCCINYLCHOLINE CHLORIDE 20 MG/ML IJ SOLN
INTRAMUSCULAR | Status: DC | PRN
  Administered 2015-02-11: 100 mg via INTRAVENOUS

## 2015-02-11 MED ORDER — LEVALBUTEROL HCL 0.63 MG/3ML IN NEBU
0.6300 mg | INHALATION_SOLUTION | RESPIRATORY_TRACT | Status: DC | PRN
  Administered 2015-02-11: 0.63 mg via RESPIRATORY_TRACT
  Filled 2015-02-11: qty 3

## 2015-02-11 MED ORDER — MIDAZOLAM HCL 2 MG/2ML IJ SOLN
2.0000 mg | Freq: Once | INTRAMUSCULAR | Status: AC
Start: 2014-08-02 — End: 2015-02-11
  Administered 2015-02-11: 2 mg via INTRAVENOUS

## 2015-02-11 MED ORDER — MIDAZOLAM HCL 2 MG/2ML IJ SOLN
2.0000 mg | Freq: Once | INTRAMUSCULAR | Status: AC
  Administered 2015-02-11: 2 mg via INTRAVENOUS

## 2015-02-11 MED ORDER — SODIUM CHLORIDE 0.9 % IJ SOLN
Freq: Once | INTRAMUSCULAR | Status: AC
  Administered 2015-02-11: 12:00:00
  Filled 2015-02-11: qty 6

## 2015-02-11 MED ORDER — PHENYLEPHRINE HCL 10 MG/ML IJ SOLN
INTRAMUSCULAR | Status: DC | PRN
  Administered 2015-02-11: 80 ug via INTRAVENOUS

## 2015-02-11 MED ORDER — DEXTROSE 5 % IV SOLN
Status: DC
  Administered 2015-02-11 – 2015-02-15 (×9): via INTRAVENOUS_CENTRAL
  Filled 2015-02-11 (×21): qty 1500

## 2015-02-11 MED ORDER — PHENYLEPHRINE 40 MCG/ML (10ML) SYRINGE FOR IV PUSH (FOR BLOOD PRESSURE SUPPORT)
PREFILLED_SYRINGE | INTRAVENOUS | Status: AC
  Filled 2015-02-11: qty 10

## 2015-02-11 MED ORDER — PROPOFOL 10 MG/ML IV BOLUS
INTRAVENOUS | Status: DC | PRN
  Administered 2015-02-11: 150 mg via INTRAVENOUS

## 2015-02-11 MED ORDER — PHENYLEPHRINE HCL 10 MG/ML IJ SOLN
0.0000 ug/min | INTRAVENOUS | Status: DC
  Administered 2015-02-11: 20 ug/min via INTRAVENOUS
  Filled 2015-02-11: qty 1

## 2015-02-11 MED ORDER — PHENYLEPHRINE HCL 10 MG/ML IJ SOLN
0.0000 ug/min | INTRAVENOUS | Status: DC
  Administered 2015-02-11 (×2): 100 ug/min via INTRAVENOUS
  Administered 2015-02-11: 70 ug/min via INTRAVENOUS
  Administered 2015-02-12: 180 ug/min via INTRAVENOUS
  Administered 2015-02-12: 75 ug/min via INTRAVENOUS
  Administered 2015-02-12: 170 ug/min via INTRAVENOUS
  Administered 2015-02-13: 160 ug/min via INTRAVENOUS
  Administered 2015-02-13 (×2): 200 ug/min via INTRAVENOUS
  Filled 2015-02-11 (×11): qty 4

## 2015-02-11 MED ORDER — IPRATROPIUM BROMIDE 0.02 % IN SOLN
0.5000 mg | Freq: Four times a day (QID) | RESPIRATORY_TRACT | Status: DC | PRN
  Administered 2015-02-11: 0.5 mg via RESPIRATORY_TRACT
  Filled 2015-02-11: qty 2.5

## 2015-02-11 MED ORDER — PROPOFOL 10 MG/ML IV BOLUS
INTRAVENOUS | Status: AC
  Filled 2015-02-11: qty 20

## 2015-02-11 MED ORDER — TRACE MINERALS CR-CU-MN-SE-ZN 10-1000-500-60 MCG/ML IV SOLN
INTRAVENOUS | Status: AC
  Administered 2015-02-11: 18:00:00 via INTRAVENOUS
  Filled 2015-02-11: qty 2640

## 2015-02-11 MED ORDER — SUCCINYLCHOLINE CHLORIDE 20 MG/ML IJ SOLN
INTRAMUSCULAR | Status: AC
  Filled 2015-02-11: qty 1

## 2015-02-11 MED ORDER — LIDOCAINE HCL (CARDIAC) 20 MG/ML IV SOLN
INTRAVENOUS | Status: AC
  Filled 2015-02-11: qty 5

## 2015-02-11 MED ORDER — ALTEPLASE 2 MG IJ SOLR
6.0000 mg | Freq: Once | INTRAMUSCULAR | Status: AC
  Administered 2015-02-11: 6 mg
  Filled 2015-02-11: qty 6

## 2015-02-11 MED ORDER — ETOMIDATE 2 MG/ML IV SOLN
INTRAVENOUS | Status: AC
  Filled 2015-02-11: qty 20

## 2015-02-11 MED ORDER — DEXTROSE 5 % IV SOLN
20.0000 g | INTRAVENOUS | Status: DC
  Administered 2015-02-11 – 2015-02-13 (×3): 20 g via INTRAVENOUS_CENTRAL
  Filled 2015-02-11 (×12): qty 200

## 2015-02-11 MED ORDER — CHLORHEXIDINE GLUCONATE 0.12% ORAL RINSE (MEDLINE KIT)
15.0000 mL | Freq: Two times a day (BID) | OROMUCOSAL | Status: DC
Start: 2015-02-11 — End: 2015-02-14
  Administered 2015-02-11 – 2015-02-14 (×7): 15 mL via OROMUCOSAL

## 2015-02-11 MED ORDER — IPRATROPIUM BROMIDE 0.02 % IN SOLN
0.5000 mg | Freq: Four times a day (QID) | RESPIRATORY_TRACT | Status: DC
  Administered 2015-02-11 – 2015-02-25 (×46): 0.5 mg via RESPIRATORY_TRACT
  Filled 2015-02-11 (×50): qty 2.5

## 2015-02-11 MED ORDER — ROCURONIUM BROMIDE 50 MG/5ML IV SOLN
INTRAVENOUS | Status: DC
Start: 2015-02-11 — End: 2015-02-11
  Filled 2015-02-11: qty 2

## 2015-02-11 MED ORDER — ANTISEPTIC ORAL RINSE SOLUTION (CORINZ)
7.0000 mL | Freq: Four times a day (QID) | OROMUCOSAL | Status: DC
Start: 2015-02-11 — End: 2015-02-14
  Administered 2015-02-11 – 2015-02-14 (×13): 7 mL via OROMUCOSAL

## 2015-02-11 NOTE — Procedures (Signed)
Hemodialysis Insertion Procedure Note AKASH WINSKI [id number] 04/17/1946  Procedure: Insertion of Hemodialysis Catheter Type: 3 port  Indications: CRRT  Procedure Details Consent: Risks of procedure as well as the alternatives and risks of each were explained to the (patient/caregiver).  Consent for procedure obtained. Time Out: Verified patient identification, verified procedure, site/side was marked, verified correct patient position, special equipment/implants available, medications/allergies/relevent history reviewed, required imaging and test results available.  Performed  Maximum sterile technique was used including antiseptics, cap, gloves, gown, hand hygiene, mask and sheet. Skin prep: Chlorhexidine; local anesthetic administered A antimicrobial bonded/coated triple lumen catheter was placed in the left internal jugular vein using the Seldinger technique. Ultrasound guidance used.Yes.   Wire visualized in LIJ on [place] Catheter placed to 20 cm. Blood aspirated via all 3 ports and then flushed x 3. Line sutured x 2 and dressing applied.  Evaluation Blood flow good Complications: No apparent complications Patient did tolerate procedure well. Chest X-ray ordered to verify placement.  CXR: pending.  Joneen Roach, AGACNP-BC [facility] Pulmonology/Critical Care Pager [phone] or [phone]  02/11/2015 2:49 AM

## 2015-02-11 NOTE — Progress Notes (Signed)
[facility] Progress Note   Subjective: on pressors now, CVP 15  Filed Vitals:   02/11/15 0615 02/11/15 0630 02/11/15 0645 02/11/15 0700  BP: 101/61 108/65 117/59 106/59  Pulse: 62 58 58 56  Temp:      TempSrc:      Resp: Height:      Weight:      SpO2: 100% 100% 100% 100%   Exam: Pale, on vent, restless No jvd Chest bilat ant rhonchi RRR no MRG Abd obese, 2-3+ ascites, RUQ drain 1-2+  Neuro sedated, lethargic, disoriented  CXR 8/26 no acute changes UA renal 11-12 cm, no hydro, normal echo     Assessment: 1 AKI prolonged ATN - D#4 CRRT, labs better but patient continues to worsen. On pressors now. Very poor prognosis.  2 Anemia s/p prbc's 3 RUQ abscess on AB + drain, per ID/ surg/ prim 4 Obesity 5 DM2  6 Nutrition onTNA 7 Volume excess / anasarca/ ascites - some improvement  Plan - cont CRRT, decrease UF to 50-100 cc/ hr net neg    Vinson Moselle MD  pager [phone]    cell [phone]  02/11/2015, 7:51 AM     Recent Labs Lab 02/09/15 1600 02/10/15 0452 02/11/15 0600  NA 133* 134* 131*  K 4.1 4.0 4.2  CL 97* 99* 100*  CO2 GLUCOSE 142* 152* 275*  BUN 34* 30* 32*  CREATININE 3.40* 2.98* 2.56*  CALCIUM 8.2* 8.3* 8.0*  PHOS 2.7 2.2* 2.5    Recent Labs Lab 02/07/15 0352  02/08/15 0540  02/09/15 1600 02/10/15 0452 02/11/15 0600  AST 20  --  58*  --   --  78*  --   ALT 11*  --  19  --   --  37  --   ALKPHOS 99  --  131*  --   --  138*  --   BILITOT 1.4*  --  1.0  --   --  1.3*  --   PROT 6.5  --  6.6  --   --  7.3  --   ALBUMIN 2.4*  < > 2.1*  < > 2.2* 2.3* 2.2*  < > = values in this interval not displayed.  Recent Labs Lab 02/05/15 0630 02/06/15 0425 02/07/15 0352  02/09/15 0400 02/10/15 0452 02/11/15 0600  WBC 13.3* 14.4* 14.3*  < > 21.2* 20.1* 22.3*  NEUTROABS 11.5* 12.3* 12.1*  --   --   --   --   HGB 7.3* 7.5* 7.6*  < > 7.7* 7.9* 8.5*  HCT 22.4* 23.0* 23.7*  < > 24.1* 24.5* 26.2*  MCV 90.3 90.2  91.2  < > 88.6 89.4 91.9  PLT 369 372 390  < > 344 367 413*  < > = values in this interval not displayed. Marland Kitchen anidulafungin  100 mg Intravenous Q24H  . antiseptic oral rinse  7 mL Mouth Rinse QID  . chlorhexidine gluconate  15 mL Mouth Rinse BID  . darbepoetin (ARANESP) injection - NON-DIALYSIS  200 mcg Subcutaneous Q Wed-1800  . diphenhydrAMINE  12.5 mg Intravenous Once  . feeding supplement (NEPRO CARB STEADY)  1,000 mL Per Tube Q24H  . insulin aspart  0-15 Units Subcutaneous 6 times per day  . insulin glargine  35 Units Subcutaneous QHS  . ipratropium  0.5 mg Nebulization Q6H  . levalbuterol  0.63 mg Nebulization Q6H  . meropenem (MERREM) IV  500 mg Intravenous Q8H  .  metoCLOPramide (REGLAN) injection  5 mg Intravenous 3 times per day  . nystatin   Topical TID  . pantoprazole (PROTONIX) IV  40 mg Intravenous Q24H  . sodium chloride  10-40 mL Intracatheter Q12H   . sodium chloride 10 mL/hr at 01/31/15 0508  . dexmedetomidine 0.5 mcg/kg/hr (02/11/15 0600)  . phenylephrine (NEO-SYNEPHRINE) Adult infusion 75 mcg/min (02/11/15 0600)  . dialysis replacement fluid (prismasate) 500 mL/hr at 02/11/15 0400  . dialysis replacement fluid (prismasate) 300 mL/hr at 02/11/15 0400  . dialysate (PRISMASATE) 1,800 mL/hr at 02/11/15 0653  . TPN (CLINIMIX) Adult without lytes 110 mL/hr at 02/10/15 1900   acetaminophen, alteplase, fentaNYL (SUBLIMAZE) injection, haloperidol lactate, heparin, heparin, iohexol, ipratropium, levalbuterol, LORazepam, [DISCONTINUED] ondansetron **OR** ondansetron (ZOFRAN) IV, sodium chloride

## 2015-02-11 NOTE — Progress Notes (Signed)
PARENTERAL NUTRITION CONSULT NOTE   Pharmacy Consult for TPN Indication: Prolonged ileus  No Known Allergies  Patient Measurements: Height: 5\' 6"  (167.6 cm) Weight: 259 lb 11.2 oz (117.8 kg) IBW/kg (Calculated) : 63.8 Adjusted Body Weight: 78kg Usual Weight:   Vital Signs: Temp: 98.4 F (36.9 C) (08/26 0359) Temp Source: Oral (08/26 0359) BP: 179/91 mmHg (08/26 0753) Pulse Rate: 74 (08/26 0753) Intake/Output from previous day: 08/25 0701 - 08/26 0700 In: 3522.5 [I.V.:638.8; NG/GT:45.5; IV Piggyback:653.6; TPN:2074.6] Out: 5261 [Urine:31; Drains:40] Intake/Output from this shift: Total I/O In: 30 [Other:30] Out: -   Labs:  Recent Labs  02/09/15 0400 02/10/15 0452 02/11/15 0600  WBC 21.2* 20.1* 22.3*  HGB 7.7* 7.9* 8.5*  HCT 24.1* 24.5* 26.2*  PLT 344 367 413*  APTT 157* 163* 37     Recent Labs  02/09/15 0400 02/09/15 1600 02/10/15 0452 02/11/15 0600  NA 132* 133* 134* 131*  K 4.0 4.1 4.0 4.2  CL 97* 97* 99* 100*  CO2 26 26 25 23   GLUCOSE 156* 142* 152* 275*  BUN 44* 34* 30* 32*  CREATININE 4.26* 3.40* 2.98* 2.56*  CALCIUM 8.2* 8.2* 8.3* 8.0*  MG 2.1  --  2.2 2.2  PHOS 3.4  3.5 2.7 2.2* 2.5  PROT  --   --  7.3  --   ALBUMIN 2.3* 2.2* 2.3* 2.2*  AST  --   --  78*  --   ALT  --   --  37  --   ALKPHOS  --   --  138*  --   BILITOT  --   --  1.3*  --    Estimated Creatinine Clearance: 33.4 mL/min (by C-G formula based on Cr of 2.56).    Recent Labs  02/10/15 1933 02/10/15 2334 02/11/15 0356  GLUCAP 212* 180* 257*    Insulin Requirements:  26 units novolog/24h, lantus 35 units qhs, 40 units regular insulin in TPN  Current Nutrition: NPO on vent, Nepro tube feeding started 8/25 but stopped overnight 8/26 after patient pulled out NGT.  IVF: NS KVO  Central access: PICC  TPN start date:  8/22  ASSESSMENT                                                                                                          HPI: 21 YOM presents with  worsening abdominal pain on 8/3.  Recent cholecystectomy on 01/04/2015 with drain removed 2 weeks prior to admission. Found to have GB fossa fluid collection (infected hematoma) and abscess. IR placed GB fossa drain.  Pharmacy asked to start TPN 8/22 for prolonged ileus and interment vomiting prohibiting use of enteral nutrition  Significant events:  8/3 septic shock from infected GB fossa 8/22 orders to start TPN 8/23 Start CRRT 8/25 intubated  Today:    Glucose - CBGs rising above goal of < 150  Electrolytes - Na falling (131), Phos improved (2.5) after replacement yesterday, Corr Ca wnl (9.44), Mg and K WNL  Renal - oliguric AKI. CRRT  8/23  24h I/O = -1.57L,  SCr improving (2.56)  Nephrology's note states volume overloaded (anasarca/ascites) is improving  LFTs - AST = 58, ALT WNL, Tbili WNL  TGs - 155 (8/23)  Prealbumin - 5.3 (8/22)  NUTRITIONAL GOALS                                                                                             RD recs: 1775 - 1975 kcals, protein 95-105gm (fluid 1700 - 2200) Clinimix 5/15 at a goal rate of 21ml/hr + 20% fat emulsion at 72ml/hr to provide: 96g/day protein, 1843Kcal/day.  With CRRT, new protein goals 120 -160gm protein and 2400-2800 kcals  Clinimix 5/15 at a goal rate of 160ml/hr + 20% fat emulsion at 36ml/hr to provide: 132g/day protein, 2354Kcal/day.   Will f/u with RD any adjustments in goals now that on ventilator  PLAN                                                                                                                         At 1800 today:  Change to Clinimix 5/15 (WITH electrolytes), continue rate at 185ml/hr.   Increase regular insulin in TPN so that patient receives 40 units regular insulin. Prefer to add to TPN over increasing Lantus so to help prevent hypoglycemia if TPN were to be abruptly stopped  Hold lipid emulsions as changed to ICU status as of 8/23 (hold lipids for the first week of ICU  admission).   Since received lipids w/ 1st bag of TPN on 8/22pm, less concern for FFA deficiency.    Will likely not be able to meet Kcal goal during this time lipids are not given.   Monitor for signs of refeeding syndrome - none currently  TPN to contain standard multivitamins and trace elements.  Continue current SSI and Lantus orders  Daily renal function panel, Mg and Phos ordered per MD   F/u daily.  Loralee Pacas, PharmD, BCPS Pager: [phone]  02/11/2015,8:13 AM

## 2015-02-11 NOTE — Progress Notes (Signed)
CRRT filter began to alarm again around 1545. Dr. Arlean Hopping notifed and ordered to begin citrate with calcium gluconate from now on. Blood returned to patient before filter was able to clot. Awaiting citrate and Ca gluconate from pharmacy at this time. Will restart ASAP.

## 2015-02-11 NOTE — Progress Notes (Signed)
[facility]    Date of Admission:  01/19/2015   Total days of antibiotics 23        Day 3 meropenem/ day 2 anidulafungin   ID: Larry [name] is a 69 y.o. male with septic shock due to infected hematoma s/p IR drain, protein-caloric malnutrition and aki requiring HD Principal Problem:   Septic shock Active Problems:   Anticoagulated   Paroxysmal a-fib   Acute blood loss anemia   Catheter-associated urinary tract infection   Intra-abdominal infection   Candidiasis of urogenital site   Acute respiratory failure with hypoxia   Infected hematoma following procedure   Leukocytosis   Diabetes mellitus with renal complications   Anemia of chronic disease   Hypokalemia   Hypomagnesemia   Acute renal failure   Protein-calorie malnutrition, severe   AKI (acute kidney injury)   Encounter for palliative care   Acute encephalopathy    Subjective: Afebrile, intubated last night for respiratory distress.drain output starting to improve, agitated occasionally  Medications:  . anidulafungin  100 mg Intravenous Q24H  . antiseptic oral rinse  7 mL Mouth Rinse QID  . chlorhexidine gluconate  15 mL Mouth Rinse BID  . darbepoetin (ARANESP) injection - NON-DIALYSIS  200 mcg Subcutaneous Q Wed-1800  . diphenhydrAMINE  12.5 mg Intravenous Once  . feeding supplement (NEPRO CARB STEADY)  1,000 mL Per Tube Q24H  . insulin aspart  0-15 Units Subcutaneous 6 times per day  . insulin glargine  35 Units Subcutaneous QHS  . ipratropium  0.5 mg Nebulization Q6H  . levalbuterol  0.63 mg Nebulization Q6H  . meropenem (MERREM) IV  500 mg Intravenous Q8H  . nystatin   Topical TID  . pantoprazole (PROTONIX) IV  40 mg Intravenous Q24H  . sodium chloride  10-40 mL Intracatheter Q12H    Objective: Vital signs in last 24 hours: Temp:  [96.1 F (35.6 C)-98.4 F (36.9 C)] 96.2 F (35.7 C) (08/26 1200) Pulse Rate:  [29-132] 76 (08/26 1513) Resp:  [13-54] 22 (08/26 1513) BP:  (40-179)/(17-91) 144/66 mmHg (08/26 1513) SpO2:  [89 %-100 %] 100 % (08/26 1513) FiO2 (%):  [35 %-100 %] 40 % (08/26 1513) Weight:  [259 lb 11.2 oz (117.8 kg)] 259 lb 11.2 oz (117.8 kg) (08/26 0500)  Physical Exam  Constitutional: He appears chronically ill.sleeping, intubated, palor/greenish complexion HEENT= ETT in place, with OGT, billious fluid  Neck: left HD cath IJ Cardiovascular: Normal rate, regular rhythm and normal heart sounds. Exam reveals no gallop and no friction rub.  No murmur heard.  Pulmonary/Chest: Effort normal and breath sounds normal. No respiratory distress. He has no wheezes.  Abdominal: mildly distended, absent bowel sounds Ext: 2+ edema c/w anasarca Skin: Skin is warm and dry. No rash noted. No erythema.     Lab Results  Recent Labs  02/10/15 0452 02/11/15 0600  WBC 20.1* 22.3*  HGB 7.9* 8.5*  HCT 24.5* 26.2*  NA 134* 131*  K 4.0 4.2  CL 99* 100*  CO2 25 23  BUN 30* 32*  CREATININE 2.98* 2.56*   Liver Panel  Recent Labs  02/10/15 0452 02/11/15 0600  PROT 7.3  --   ALBUMIN 2.3* 2.2*  AST 78*  --   ALT 37  --   ALKPHOS 138*  --   BILITOT 1.3*  --     Studies/Results: US Paracentesis  02/09/2015   INDICATION: ascites  EXAM: ULTRASOUND-GUIDED PARACENTESIS--Bedside/portable  COMPARISON:  None.  MEDICATIONS: 100 cc  1% lidocaine  COMPLICATIONS: None immediate  TECHNIQUE: Informed written consent was obtained from the patient after a discussion of the risks, benefits and alternatives to treatment. A timeout was performed prior to the initiation of the procedure.  Initial ultrasound scanning demonstrates a large amount of ascites within the right lower abdominal quadrant. The right lower abdomen was prepped and draped in the usual sterile fashion. 1% lidocaine with epinephrine was used for local anesthesia. Under direct ultrasound guidance, a 19 gauge, 7-cm, Yueh catheter was introduced. An ultrasound image was saved for documentation purposed. The  paracentesis was performed. The catheter was removed and a dressing was applied. The patient tolerated the procedure well without immediate post procedural complication.  FINDINGS: A total of approximately 150 cc of bloody appearing fluid was removed. Samples were sent to the laboratory as requested by the clinical team.  IMPRESSION: Successful ultrasound-guided paracentesis yielding 150 cc of peritoneal fluid.  Read by:  Robet Leu [facility]   Electronically Signed   By: Simonne Come M.D.   On: 02/09/2015 15:13   Dg Chest Port 1 View  02/11/2015   CLINICAL DATA:  Central line placement.  EXAM: PORTABLE CHEST - 1 VIEW  COMPARISON:  02/10/2015  FINDINGS: Interval placement of endotracheal tube with tip measuring 2.3 cm above the carina. Enteric tube was placed with tip off the field of view but below the left hemidiaphragm. Left central venous catheter was placed with tip over the low SVC region. No pneumothorax. Shallow inspiration. Mild cardiac enlargement. Linear atelectasis in the right lung base. No consolidation in the lungs. No blunting of costophrenic angles. No pneumothorax. Degenerative changes in the spine and shoulders.  IMPRESSION: Appliances appear to be in satisfactory position. Shallow inspiration with atelectasis in the right lung base. Cardiac enlargement.   Electronically Signed   By: Burman Nieves M.D.   On: 02/11/2015 03:04   Dg Chest Port 1 View  02/10/2015   CLINICAL DATA:  Projectile vomiting and difficulty breathing  EXAM: PORTABLE CHEST - 1 VIEW  COMPARISON:  02/08/2015  FINDINGS: Left jugular central line and right-sided PICC line are again identified and stable. The overall inspiratory effort is poor. The left lung is clear. Elevation of the right hemidiaphragm is noted. Mild vascular crowding is seen. Cardiac shadow is enlarged.  IMPRESSION: Poor inspiratory effort with vascular crowding. No focal confluent infiltrate is seen.   Electronically Signed   By: Alcide Clever M.D.   On:  02/10/2015 11:35     Assessment/Plan:  Intra-abdominal infection with infected hematoma post choelcystectomy =  Continue on meropenem and empiric antifungal. Undergoing tpa protocol of drain to see if accelerate drainage of GB fossa hematoma/infection.  aki = continues on CRRT for now. Will renally dose meropenem  Leukocytosis = still trending up. Recently broadened to meropenem and anidulafungin. Repeat drain cultures and Blood cultures are pending to see if can narrow regimen.  Secondary peritonitis = likely related to 9 X 9 cm hematoma/fluid collection. Cultures pending  Severe protein-caloric malnutrition = continue with tpn for now in setting of having ileus  Respiratory distress = defer to PCCM for management  Agree with palliative care consultation, remains critically ill.  Dr Luciana Axe available for questions over the weekend.   Drue Second [facility] Cell: [phone] Pager: [phone]  02/11/2015, 3:27 PM

## 2015-02-11 NOTE — Progress Notes (Signed)
CRRT set began alarming at approximately 0850. Blood returned to patient and new set loaded and restarted by 0930.

## 2015-02-11 NOTE — Progress Notes (Signed)
PULMONARY / CRITICAL CARE MEDICINE   Name: Larry [name] MRN: [id number] DOB: 04-07-1946    ADMISSION DATE:  01/19/2015 CONSULTATION DATE:  8/4  REFERRING MD :  [name]  CHIEF COMPLAINT:  Septic shock   INITIAL PRESENTATION:  69 year old male w/ CAF on Elliquis who recently underwent lap chole on 7/14. Was discharged home w/drain. Drain removed about a week before presentation to ER. Admitted directly from [facility] on 8/3 w/ septic shock which was likely due to infected hematoma in the GB fossa. PCCM asked to see after new perc drain placed on 8/4, when he remained hypotensive.    STUDIES:  CT abdomen/Pelvis W/ 7/28 - Gas & fluid at cholecystectomy site w/o discrete abscess. Min ascites. Small right pleural eff. CT abdomen/pelvis w/o 8/5 - Gas between liver & stomach. No additional free air. Increased flow void & high density layering c/w increasing hemorrhage as well as increased right pleural eff. CT abdomen/pelvis 8/18 >> 9.8 cm hematoma with gas in the gallbladder fossa, indwelling pigtail drainage catheter, 12.6 cm hematoma along the posterior R liver, mod ascites, mild wall thickening involving the R colon CT abdomen/pelvis 8/22 >> stable perihepatic hematoma, stable gallbladder fossa collection despite adequate drainage cath placement, significant free fluid within the abdomen/pelvis slightly increased from prior exam  SIGNIFICANT EVENTS: Admitted 8/3 w/ abd pain and hypotension  8/04  hypotensive over-night. Went for [facility] drain of GB fossa. Found what looked like old infected hematoma. PCCM asked to see post-op for shock 8/05  off pressors. WOB worse.  8/06  WOB & wheezing slightly worse. Progressing abdominal pain 8/07  Hgb declining w/ increasing intraperitoneal hemorrhage 8/07  Transfused 2u PRBCs 8/09  work of breathing still significant w/ marked upper airway component  8/10  breathing better. 1.6 liters negative. Slight bump in scr. Vanc stopped. Getting OOB for  first time 8/11  eating regular diet. Up in chair. Feeling better.  Respiratory status improved.  PCCM s/o 8/14  Renal consulted for rising sr cr (to 3.60), baseline sr cr 1.1-1.5 (prior IV contrast 7/28).   8/14  Concern for yeast / bacterial UTI  8/15  ID consulted > abx narrowed to zosyn only, fluconazole for yeast  8/16  UOP increased on IV lasix 8/18  NPO, sr cr improving, CT abd as above  8/18  GB drain upsized per IR to 16 Fr 8/19  Abd pain, eating well.  Diarrhea after ensure.  To PO lasix.  Discussed gastrostomy tube with IR 8/22  Sr Cr rising, intermittent vomiting.  CT abd as above.  Increased confusion 8/23  HD cath inserted, CRRT initiated.  Gastrostomy tube held off due to CRRT & Hgb drop 8/24  WBC elevated, cultures repeated.   8/24  Paracentesis >> 150 ml "bloody bile" fluid removed, loculated and difficult to aspirate 8/25  Palliative care consulted, PCCM consulted.  Vomiting with NGT insertion.  8/26  Intubated early am(0200) for desaturations, significant agitation, delirium    SUBJECTIVE:  RN reports pt developed worsening agitation overnight, desaturated with episode of agitation and pulled out HD catheter.  Trickle feeding was attempted last PM but stopped with agitation.  Intubated per Anesthesia.  Goal net neg 50-27ml/hr per Renal.     VITAL SIGNS: Temp:  [97.5 F (36.4 C)-98.4 F (36.9 C)] 98.4 F (36.9 C) (08/26 0359) Pulse Rate:  [29-132] 74 (08/26 0753) Resp:  [13-54] 31 (08/26 0753) BP: (40-179)/(17-91) 179/91 mmHg (08/26 0753) SpO2:  [88 %-100 %]  100 % (08/26 0753) FiO2 (%):  [35 %-100 %] 50 % (08/26 0753) Weight:  [259 lb 11.2 oz (117.8 kg)] 259 lb 11.2 oz (117.8 kg) (08/26 0500)  HEMODYNAMICS: CVP:  [12 mmHg-89 mmHg] 15 mmHg   VENTILATOR SETTINGS: Vent Mode:  [-] PRVC FiO2 (%):  [35 %-100 %] 50 % Set Rate:  [12 bmp] 12 bmp Vt Set:  [510 mL] 510 mL PEEP:  [5 cmH20] 5 cmH20 Plateau Pressure:  [22 cmH20-26 cmH20] 26 cmH20   INTAKE /  OUTPUT:  Intake/Output Summary (Last 24 hours) at 02/11/15 0931 Last data filed at 02/11/15 0750  Gross per 24 hour  Intake 3327.45 ml  Output   4757 ml  Net -1429.55 ml    PHYSICAL EXAMINATION: General:  Acutely ill adult male lying in bed on vent Neuro:  Sedate, no distress, pupils 2-10mm R HEENT:  MM pink/dry, no jvd, L HD cath c/d/i Cardiovascular:  s1s2 rrr, no m/r/g Lungs:  Even/non-labored, lungs bilaterally clear, diminished lower Abdomen:  RUQ perc drain in place with scant bloody drainage. Hypoactive BS. Protuberant. Musculoskeletal:  No joint effusions or deformities. Skin:  Warm & dry. No rashes or lesions. No abdominal bruising. Trace LE edema   LABS:  CBC  Recent Labs Lab 02/09/15 0400 02/10/15 0452 02/11/15 0600  WBC 21.2* 20.1* 22.3*  HGB 7.7* 7.9* 8.5*  HCT 24.1* 24.5* 26.2*  PLT 344 367 413*   Coag's  Recent Labs Lab 02/09/15 0400 02/10/15 0452 02/11/15 0600  APTT 157* 163* 37   BMET  Recent Labs Lab 02/09/15 1600 02/10/15 0452 02/11/15 0600  NA 133* 134* 131*  K 4.1 4.0 4.2  CL 97* 99* 100*  CO2 BUN 34* 30* 32*  CREATININE 3.40* 2.98* 2.56*  GLUCOSE 142* 152* 275*   Electrolytes  Recent Labs Lab 02/09/15 0400 02/09/15 1600 02/10/15 0452 02/11/15 0600  CALCIUM 8.2* 8.2* 8.3* 8.0*  MG 2.1  --  2.2 2.2  PHOS 3.4  3.5 2.7 2.2* 2.5   Sepsis Markers No results for input(s): LATICACIDVEN, PROCALCITON, O2SATVEN in the last 168 hours.   ABG  Recent Labs Lab 02/10/15 1158 02/11/15 0002 02/11/15 0250  PHART 7.411 7.352 7.319*  PCO2ART 39.6 37.3 42.0  PO2ART 68.7* 57.5* 243*   Liver Enzymes  Recent Labs Lab 02/07/15 0352  02/08/15 0540  02/09/15 1600 02/10/15 0452 02/11/15 0600  AST 20  --  58*  --   --  78*  --   ALT 11*  --  19  --   --  37  --   ALKPHOS 99  --  131*  --   --  138*  --   BILITOT 1.4*  --  1.0  --   --  1.3*  --   ALBUMIN 2.4*  < > 2.1*  < > 2.2* 2.3* 2.2*  < > = values in this  interval not displayed.   Cardiac Enzymes No results for input(s): TROPONINI, PROBNP in the last 168 hours.   Glucose  Recent Labs Lab 02/10/15 0742 02/10/15 1228 02/10/15 1609 02/10/15 1933 02/10/15 2334 02/11/15 0356  GLUCAP 158* 131* 194* 212* 180* 257*    Imaging Dg Chest Port 1 View  02/11/2015   CLINICAL DATA:  Central line placement.  EXAM: PORTABLE CHEST - 1 VIEW  COMPARISON:  02/10/2015  FINDINGS: Interval placement of endotracheal tube with tip measuring 2.3 cm above the carina. Enteric tube was placed with tip off the field of view  but below the left hemidiaphragm. Left central venous catheter was placed with tip over the low SVC region. No pneumothorax. Shallow inspiration. Mild cardiac enlargement. Linear atelectasis in the right lung base. No consolidation in the lungs. No blunting of costophrenic angles. No pneumothorax. Degenerative changes in the spine and shoulders.  IMPRESSION: Appliances appear to be in satisfactory position. Shallow inspiration with atelectasis in the right lung base. Cardiac enlargement.   Electronically Signed   By: Burman Nieves M.D.   On: 02/11/2015 03:04   Dg Chest Port 1 View  02/10/2015   CLINICAL DATA:  Projectile vomiting and difficulty breathing  EXAM: PORTABLE CHEST - 1 VIEW  COMPARISON:  02/08/2015  FINDINGS: Left jugular central line and right-sided PICC line are again identified and stable. The overall inspiratory effort is poor. The left lung is clear. Elevation of the right hemidiaphragm is noted. Mild vascular crowding is seen. Cardiac shadow is enlarged.  IMPRESSION: Poor inspiratory effort with vascular crowding. No focal confluent infiltrate is seen.   Electronically Signed   By: Alcide Clever M.D.   On: 02/10/2015 11:35    ASSESSMENT / PLAN:  PULMONARY A: Ventilator Dependent Respiratory Failure - in the setting of significant agitation, renal fx, delirium  Chronically elevated right HD Acute Hypoxic Respiratory  Failure Right pleural effusion - progessing on Abd CT 8/6  P:   MV Support, 8cc/kg Wean PEEP/FiO2 for sats > 92% Intermittent ABG Trend CXR  Xopenex neb q6hr PRN  CARDIOVASCULAR CVL Right IJ 8/4 >> 8/22 RUE PICC 8/22 >>  A:  Severe sepsis/septic shock - Shock resolved Hypotension - 8/26, started on neo post intubation, ? Sedation related Hx PAF - CHADVASC score 4; currently NSR  Prior NSTEMI   P:  Neo for MAP > 65.  May need to change to levo but hopeful to avoid with PAF hx Consider placement of Aline Hold all antihypertensives Tele monitoring in ICU  Heparin gtt on hold for hx of PAF, will defer to [facility] as he has had intra-catheter TPA Assess EKG post intubation / respiratory failure  RENAL A:   ARF - in setting of septic shock, prior hypotension/ATN Hyperkalemia - Resolved Hyponatremia  Hypophosphatemia  P:   Monitor UOP / BMP  Replace electrolytes as indicated Nephrology following, CVVHD in progress Daily renal panel   GASTROINTESTINAL A:   Infected hematoma s/p cholecystectomy - Now s/p CT guided Perc drain with subsequent upsize to 16 Fr GERD - worse 8/5 w/ associated UAW wheeze Intra-abdominal hemorrhage / hematoma - Hgb stable now Nausea / Vomiting - concern for ileus (8/25) Protein Calorie Malnutrition   P:   TNA per pharmacy  Trickle feeding initiated per [facility] 8/25, on hold for now.  Defer to them for restart Protonix QD Reflux precautions NPO Reglan Q8 x4 doses Place NGT to LIS   HEMATOLOGIC A:   Anemia - in setting of intra-abdominal hemorrhage (8/4).  S/p 2 u PRBC on 8/4 & 2u 8/7   P:  Trend CBC  SCDs Transfuse for Hbg < 7  Heparin gtt as above Daily CBC   INFECTIOUS A:   Severe sepsis/septic shock - presume source is infected hematoma from recent Cholecystectomy. Shock resolved.  S/p Perc GB fossa site drain 8/4 - minimal serous output High Risk for Fungemia P:   Trending WBC / Fever curve  ID following, appreciate input    Abscess 8/4>>>neg BCX2 8/4>>>neg  Peritoneal fluid GS 8/24 >> abundant WBC, no organisms seen Peritoneal fluid culture 8/24 >>  BCx2 8/25 >>   Meropenem 8/24 >>  Fluconazole 8/15 >> 8/23  Repeat blood cultures, high risk fungemia.  Elevated WBC but afebrile   ENDOCRINE A: DM - CBG's well controlled  P:   SSI Q4 Lantus 35 units QHS   NEUROLOGIC A:  Abdominal Pain/Peritonitis -  Delirium  Critical Illness Deconditioning  P:   RASS goal:  0 to -1 Supportive care Delirium prevention measures: minimize sedating medications, promote sleep / wake cycle  Fentanyl IV prn for pain  Haldol for agitation, follow Qtc Precedex gtt for sedation Passive ROM   FAMILY  - Updates:  Wife called and updated via phone per NP 8/26.   - Inter-disciplinary family meet or Palliative Care meeting due by:  Ongoing, family wants to continue full aggressive measures.  Hopeful that patient will be able to recover.      Canary Brim, NP-C [facility] Pgr: [phone] or if no answer [phone] 02/11/2015, 9:31 AM

## 2015-02-11 NOTE — Progress Notes (Signed)
  tpa in 30cc saline was flushed into GB fossa drain catheter and capped for 2 hours today. Drain is now uncapped and new JP suction bulb placed on drain. Follow H/H and output.   Pattricia Boss PA-C Interventional Radiology  02/11/15  3:53 PM

## 2015-02-11 NOTE — Progress Notes (Signed)
CRRT reinitiated with citrate and calcium gluconate at 1835.

## 2015-02-11 NOTE — Anesthesia Procedure Notes (Signed)
Procedure Name: Intubation Date/Time: 02/11/2015 1:34 AM Performed by: Jarvis Newcomer A Pre-anesthesia Checklist: Patient identified, Emergency Drugs available, Suction available, Patient being monitored and Timeout performed Patient Re-evaluated:Patient Re-evaluated prior to inductionOxygen Delivery Method: Ambu bag Preoxygenation: Pre-oxygenation with 100% oxygen (RSI with cricoid pressure by RT. ATOI) Intubation Type: IV induction, Rapid sequence and Cricoid Pressure applied Laryngoscope Size: Glidescope and 4 Grade View: Grade I Tube type: Subglottic suction tube Tube size: 7.5 mm Number of attempts: 1 Airway Equipment and Method: Video-laryngoscopy Placement Confirmation: ETT inserted through vocal cords under direct vision,  CO2 detector and breath sounds checked- equal and bilateral Secured at: 23 cm Tube secured with: Tape Dental Injury: Teeth and Oropharynx as per pre-operative assessment

## 2015-02-11 NOTE — Progress Notes (Signed)
Nutrition Follow-up  DOCUMENTATION CODES:   Severe malnutrition in context of acute illness/injury, Morbid obesity  INTERVENTION:   -TPN per Pharmacy -When medically appropriate initiate trickle feeds of Nepro Carb Steady @ 10 ml/hr. Will provide 432 kcal, 19g protein and 175 ml of H2O.  TF recommendations: - If Panda vs PEG able to be placed, recommend Nepro @ 50 mL/hr with 30 mL Prostat TID which will provide 2460 kcal, 142 grams protein, and 872 mL free water  -RD to continue to monitor   NUTRITION DIAGNOSIS:   Inadequate oral intake related to lethargy/confusion as evidenced by meal completion < 25%.  Ongoing.  GOAL:   Provide needs based on ASPEN/SCCM guidelines  Currently meeting with TPN  MONITOR:   Vent status, Labs, Weight trends, Skin, I & O's, Other (Comment) (TPN regimen)  ASSESSMENT:   69 y/o admitted in 10/05/2012 with Sepsis, hypotension, acute renal failure and acute cholecystitis. He had been place on Eliquis for his AF by the [place]. He had an MI 2 years ago and did not follow up with his cardiologist. He was acutely ill and underwent a percutaneous drain placement on 10/06/14 by IR. He went home on 10/11/14. He was readmitted on 12/30/14 and underwent laparoscopic cholecystectomy with IOC. He was left with a drain in place and the site had "SNOW' placed in the GB fossa for hemostasis after the procedure. Drain was removed last week but there was some brusing at the site. He is transferred from [facility] with complaints of increased abdominal pain.  Pt was intubated this AM. Continues on CRRT. Trophic feeds of Nepro would started last night for patient but were stopped d/t intubation. Formula still in room whenever able to start feeds again. Pt was ordered Reglan.  Patient is currently intubated on ventilator support MV: 11.6 L/min Temp (24hrs), Avg:97.9 F (36.6 C), Min:97.5 F (36.4 C), Max:98.4 F (36.9 C)  Propofol: none  Awaiting Pharmacy note for  today. TPN was advanced to goal of 110 ml/hr yesterday providing 132g protein (88% of protein needs) and 1874 kcal (88% of kcal needs).  Labs reviewed: CBGs: 180-257 Low Na Elevated BUN & Creatinine Mg/Phos WNL  Diet Order:  TPN (CLINIMIX) Adult without lytes Diet NPO time specified .TPN (CLINIMIX-E) Adult  Skin:  Wound (see comment) (abdominal incision)  Last BM:  8/26  Height:   Ht Readings from Last 1 Encounters:  01/20/15  (1.676 m)    Weight:   Wt Readings from Last 1 Encounters:  02/11/15 259 lb 11.2 oz (117.8 kg)    Ideal Body Weight:  64.54 kg (kg)  BMI:  Body mass index is 41.94 kg/(m^2).  Estimated Nutritional Needs:   Kcal:  2129  Protein:  150-170g  Fluid:  1.5L/day  EDUCATION NEEDS:   No education needs identified at this time  Tilda Franco, MS, RD, LDN Pager: [phone] After Hours Pager: [phone]

## 2015-02-11 NOTE — Progress Notes (Signed)
Date:  February 11, 2015 U.R. performed for needs and level of care. Will continue to follow for Case Management needs.  Marcelle Smiling, RN, BSN, [place]   [phone]

## 2015-02-11 NOTE — Care Management Important Message (Signed)
Important Message  Patient Details  Name: LEROY PETTWAY MRN: [id number] Date of Birth: 1946-04-05   Medicare Important Message Given:  Yes-second notification given    Haskell Flirt 02/11/2015, 1:16 PMImportant Message  Patient Details  Name: AWAB ABEBE MRN: [id number] Date of Birth: [birth year]   Medicare Important Message Given:  Yes-second notification given    Haskell Flirt 02/11/2015, 1:16 PM

## 2015-02-11 NOTE — Progress Notes (Signed)
Subjective: Events noted. Intubated overnight for resp distress. Getting 2nd round of TPA in perc drain now. 2BMs overnight. Min OG output   Objective: Vital signs in last 24 hours: Temp:  [97.5 F (36.4 C)-98.4 F (36.9 C)] 98.4 F (36.9 C) (08/26 0359) Pulse Rate:  [29-132] 55 (08/26 1245) Resp:  [13-54] 22 (08/26 1245) BP: (40-179)/(17-91) 119/60 mmHg (08/26 1245) SpO2:  [89 %-100 %] 100 % (08/26 1245) FiO2 (%):  [35 %-100 %] 40 % (08/26 1300) Weight:  [117.8 kg (259 lb 11.2 oz)] 117.8 kg (259 lb 11.2 oz) (08/26 0500) Last BM Date: 02/11/15  Intake/Output from previous day: 08/25 0701 - 08/26 0700 In: 3522.5 [I.V.:638.8; NG/GT:45.5; IV Piggyback:653.6; TPN:2074.6] Out: 5261 [Urine:31; Drains:40] Intake/Output this shift: Total I/O In: 1230.1 [I.V.:460.1; Other:60; IV Piggyback:50; TPN:660] Out: 1022 [Other:1022]  Intubated.  Obese, soft, doesn't grimace   Lab Results:   Recent Labs  02/10/15 0452 02/11/15 0600  WBC 20.1* 22.3*  HGB 7.9* 8.5*  HCT 24.5* 26.2*  PLT 367 413*   BMET  Recent Labs  02/10/15 0452 02/11/15 0600  NA 134* 131*  K 4.0 4.2  CL 99* 100*  CO2 25 23  GLUCOSE 152* 275*  BUN 30* 32*  CREATININE 2.98* 2.56*  CALCIUM 8.3* 8.0*   PT/INR No results for input(s): LABPROT, INR in the last 72 hours. ABG  Recent Labs  02/11/15 0002 02/11/15 0250  PHART 7.352 7.319*  HCO3 20.3 21.1    Studies/Results: US Paracentesis  02/09/2015   INDICATION: ascites  EXAM: ULTRASOUND-GUIDED PARACENTESIS--Bedside/portable  COMPARISON:  None.  MEDICATIONS: 100 cc 1% lidocaine  COMPLICATIONS: None immediate  TECHNIQUE: Informed written consent was obtained from the patient after a discussion of the risks, benefits and alternatives to treatment. A timeout was performed prior to the initiation of the procedure.  Initial ultrasound scanning demonstrates a large amount of ascites within the right lower abdominal quadrant. The right lower abdomen was  prepped and draped in the usual sterile fashion. 1% lidocaine with epinephrine was used for local anesthesia. Under direct ultrasound guidance, a 19 gauge, 7-cm, Yueh catheter was introduced. An ultrasound image was saved for documentation purposed. The paracentesis was performed. The catheter was removed and a dressing was applied. The patient tolerated the procedure well without immediate post procedural complication.  FINDINGS: A total of approximately 150 cc of bloody appearing fluid was removed. Samples were sent to the laboratory as requested by the clinical team.  IMPRESSION: Successful ultrasound-guided paracentesis yielding 150 cc of peritoneal fluid.  Read by:  Robet Leu [facility]   Electronically Signed   By: Simonne Come M.D.   On: 02/09/2015 15:13   Dg Chest Port 1 View  02/11/2015   CLINICAL DATA:  Central line placement.  EXAM: PORTABLE CHEST - 1 VIEW  COMPARISON:  02/10/2015  FINDINGS: Interval placement of endotracheal tube with tip measuring 2.3 cm above the carina. Enteric tube was placed with tip off the field of view but below the left hemidiaphragm. Left central venous catheter was placed with tip over the low SVC region. No pneumothorax. Shallow inspiration. Mild cardiac enlargement. Linear atelectasis in the right lung base. No consolidation in the lungs. No blunting of costophrenic angles. No pneumothorax. Degenerative changes in the spine and shoulders.  IMPRESSION: Appliances appear to be in satisfactory position. Shallow inspiration with atelectasis in the right lung base. Cardiac enlargement.   Electronically Signed   By: Burman Nieves M.D.   On: 02/11/2015 03:04   Dg  Chest Port 1 View  02/10/2015   CLINICAL DATA:  Projectile vomiting and difficulty breathing  EXAM: PORTABLE CHEST - 1 VIEW  COMPARISON:  02/08/2015  FINDINGS: Left jugular central line and right-sided PICC line are again identified and stable. The overall inspiratory effort is poor. The left lung is clear.  Elevation of the right hemidiaphragm is noted. Mild vascular crowding is seen. Cardiac shadow is enlarged.  IMPRESSION: Poor inspiratory effort with vascular crowding. No focal confluent infiltrate is seen.   Electronically Signed   By: Alcide Clever M.D.   On: 02/10/2015 11:35    Anti-infectives: Anti-infectives    Start     Dose/Rate Route Frequency Ordered Stop   02/11/15 1800  anidulafungin (ERAXIS) 100 mg in sodium chloride 0.9 % 100 mL IVPB     100 mg over 90 Minutes Intravenous Every 24 hours 02/10/15 1744     02/10/15 1730  anidulafungin (ERAXIS) 200 mg in sodium chloride 0.9 % 200 mL IVPB     200 mg over 180 Minutes Intravenous  Once 02/10/15 1720 02/10/15 2230   02/10/15 0200  meropenem (MERREM) 500 mg in sodium chloride 0.9 % 50 mL IVPB     500 mg 100 mL/hr over 30 Minutes Intravenous Every 8 hours 02/09/15 1808     02/10/15 0000  meropenem (MERREM) 500 mg in sodium chloride 0.9 % 50 mL IVPB  Status:  Discontinued     500 mg 100 mL/hr over 30 Minutes Intravenous 4 times per day 02/09/15 1655 02/09/15 1808   02/09/15 1800  meropenem (MERREM) 1 g in sodium chloride 0.9 % 100 mL IVPB     1 g 200 mL/hr over 30 Minutes Intravenous  Once 02/09/15 1655 02/09/15 1818   02/08/15 1800  piperacillin-tazobactam (ZOSYN) IVPB 3.375 g  Status:  Discontinued     3.375 g 100 mL/hr over 30 Minutes Intravenous 4 times per day 02/08/15 1403 02/09/15 1655   02/07/15 1400  piperacillin-tazobactam (ZOSYN) IVPB 2.25 g  Status:  Discontinued     2.25 g 100 mL/hr over 30 Minutes Intravenous 3 times per day 02/07/15 0750 02/08/15 1403   02/04/15 1200  piperacillin-tazobactam (ZOSYN) IVPB 2.25 g  Status:  Discontinued     2.25 g 100 mL/hr over 30 Minutes Intravenous 4 times per day 02/04/15 0859 02/04/15 0905   02/04/15 1200  piperacillin-tazobactam (ZOSYN) IVPB 3.375 g  Status:  Discontinued     3.375 g 12.5 mL/hr over 240 Minutes Intravenous Every 8 hours 02/04/15 0905 02/07/15 0750   02/02/15 2000   fluconazole (DIFLUCAN) tablet 100 mg     100 mg Oral Every 24 hours 02/02/15 1908 02/08/15 2041   02/02/15 1800  fluconazole (DIFLUCAN) tablet 100 mg  Status:  Discontinued     100 mg Oral Daily 02/02/15 1545 02/02/15 1901   01/31/15 1000  fluconazole (DIFLUCAN) tablet 100 mg  Status:  Discontinued     100 mg Oral Daily 01/31/15 0727 02/02/15 1313   01/29/15 1700  piperacillin-tazobactam (ZOSYN) IVPB 2.25 g  Status:  Discontinued     2.25 g 100 mL/hr over 30 Minutes Intravenous Every 8 hours 01/29/15 0845 02/04/15 0859   01/24/15 2200  vancomycin (VANCOCIN) IVPB 1000 mg/200 mL premix     1,000 mg 200 mL/hr over 60 Minutes Intravenous  Once 01/24/15 1349 01/24/15 2301   01/21/15 2000  vancomycin (VANCOCIN) 1,500 mg in sodium chloride 0.9 % 500 mL IVPB  Status:  Discontinued     1,500 mg 250  mL/hr over 120 Minutes Intravenous Every 24 hours 01/21/15 1048 01/23/15 1726   01/20/15 2000  vancomycin (VANCOCIN) 1,250 mg in sodium chloride 0.9 % 250 mL IVPB  Status:  Discontinued     1,250 mg 166.7 mL/hr over 90 Minutes Intravenous Every 24 hours 01/19/15 1945 01/20/15 0843   01/20/15 2000  vancomycin (VANCOCIN) 1,250 mg in sodium chloride 0.9 % 250 mL IVPB  Status:  Discontinued     1,250 mg 166.7 mL/hr over 90 Minutes Intravenous Every 24 hours 01/20/15 1519 01/21/15 1048   01/20/15 0000  piperacillin-tazobactam (ZOSYN) IVPB 3.375 g  Status:  Discontinued     3.375 g 12.5 mL/hr over 240 Minutes Intravenous Every 8 hours 01/19/15 1946 01/29/15 0845   01/19/15 1715  vancomycin (VANCOCIN) 2,500 mg in sodium chloride 0.9 % 500 mL IVPB     2,500 mg 250 mL/hr over 120 Minutes Intravenous  Once 01/19/15 1707 01/19/15 2159   01/19/15 1715  piperacillin-tazobactam (ZOSYN) IVPB 3.375 g     3.375 g 100 mL/hr over 30 Minutes Intravenous  Once 01/19/15 1707 01/19/15 1900      Assessment/Plan: GB fossa hematoma ARF on CKD Anemia chronic disease Malnutrition Ileus Resp failure PAF  Vent per CCM.  Agree with holding extubation until more alert, following commands  Drain output increased with TPA. No signs of bleeding as result. Appears that TPA may be helping to liquefying hematoma. Not a surgical candidate now for operative drainage. cx from repeat aspiration still negative. Original cx from perc drain - showed mixed organisms. Infected hematoma makes most sense of his infection however cultures not that impressive, f/u blood cx. Appreciate  ID rec  ARF on CKD - cvvh per renal  Nutrition - cont TPN.  Will restart trickle feeds and see how tolerates. While on Neo, not sure if will tolerate. Check residuals per protocol.   Heme - hgb stable. IV heparin for PAF on hold while getting TPA thru perc drain. Really question need for resuming IV heparin for PAF. i think the risk of him rebleeding outweighs risk of CVA.  However, he prob needs chemical VTE prophylaxis. Will discuss with CCM  Goal for weekend - wean Neo, work toward extubation, adv tube feeds if tolerates.   [name]. [name], MD, FACS General, Bariatric, & Minimally Invasive Surgery [facility], [place]    LOS: 23 days    Larry [name] 02/11/2015

## 2015-02-11 NOTE — Progress Notes (Signed)
Spoke with nurse to get update.  Tried trickle tube feeds - pt didn't tolerate - vomited  Stop enteral feeds.  OG to LIWS for now Cont reglan Cont TPN Pt has ileus - prob won't resolve until Neo off. Did have 2 bm last night which is encouraging.  Work on Social research officer, government Neo F/u blood cultures  Spoke with wife and gave extensive update. She expressed gratitude to all team members for care Larry [name] receiving.  Advised her that he may not have much progress on vent this weekend but primary weekend goal is to work on weaning Neo off. Other goal weaning amount of vent and hoping mental status improves.   i discussed his IV heparin for PAF with Dr Kendrick Fries earlier today. I would not recommend restarting it for time being given risk of bleeding and small risk of CVA. i believe risk of bleeding outweighs benefits.   IF CCM agrees with not resuming IV heparin, he will need subcu DVT prophylaxis  [name]. [name], MD, FACS General, Bariatric, & Minimally Invasive Surgery [facility], [place]

## 2015-02-11 NOTE — Progress Notes (Signed)
PCCM INTERVAL PROGRESS NOTE  Called to bedside to assess patient for respiratory distress. On my evaluation patient was agitated, tachycardic, and having paradoxical respirations. Lung sounds were coarse wheeze, sounded more lower airway than what was described on morning rounds. Attempted to treat with Solu-medrol and bronchodilators, however, he did not improve. Because of agitation his HD catheter got pulled. Would be unable to replace with current level of delirium/agitation.   Plan: Intubation per Anesthesia PRVC, 8cc/kg, 12, 100%, 5 PEEP ABG one hour Place HD catheter to resume CRRT Precedex infusion per PAD protocol VAP bundle CXR for line/tube placement  Addendum: Developed hypotension immediately post-intubation. Will give small NS bolus and phenylephrine, with hope to wean off rapidly for MAP > 65mm/Hg  Joneen Roach, AGACNP-BC [facility] Pulmonology/Critical Care Pager [phone] or [phone]  02/11/2015 2:42 AM

## 2015-02-12 ENCOUNTER — Inpatient Hospital Stay (HOSPITAL_COMMUNITY): Payer: Medicare Other

## 2015-02-12 LAB — POCT I-STAT, CHEM 8
BUN: 29 mg/dL — AB (ref 6–20)
BUN: 32 mg/dL — ABNORMAL HIGH (ref 6–20)
BUN: 26 mg/dL — AB (ref 6–20)
BUN: 34 mg/dL — AB (ref 6–20)
BUN: 27 mg/dL — AB (ref 6–20)
BUN: 33 mg/dL — AB (ref 6–20)
BUN: 34 mg/dL — AB (ref 6–20)
BUN: 29 mg/dL — AB (ref 6–20)
BUN: 29 mg/dL — ABNORMAL HIGH (ref 6–20)
BUN: 35 mg/dL — AB (ref 6–20)
BUN: 29 mg/dL — AB (ref 6–20)
BUN: 32 mg/dL — AB (ref 6–20)
BUN: 28 mg/dL — AB (ref 6–20)
BUN: 29 mg/dL — AB (ref 6–20)
CALCIUM ION: 0.54 mmol/L — AB (ref 1.13–1.30)
CALCIUM ION: 0.55 mmol/L — AB (ref 1.13–1.30)
CALCIUM ION: 0.64 mmol/L — AB (ref 1.13–1.30)
CALCIUM ION: 1.08 mmol/L — AB (ref 1.13–1.30)
CHLORIDE: 102 mmol/L (ref 101–111)
CHLORIDE: 87 mmol/L — AB (ref 101–111)
CHLORIDE: 89 mmol/L — AB (ref 101–111)
CHLORIDE: 89 mmol/L — AB (ref 101–111)
CHLORIDE: 90 mmol/L — AB (ref 101–111)
CHLORIDE: 91 mmol/L — AB (ref 101–111)
CHLORIDE: 91 mmol/L — AB (ref 101–111)
CHLORIDE: 92 mmol/L — AB (ref 101–111)
CHLORIDE: 92 mmol/L — AB (ref 101–111)
CHLORIDE: 94 mmol/L — AB (ref 101–111)
CHLORIDE: 96 mmol/L — AB (ref 101–111)
CREATININE: 1.5 mg/dL — AB (ref 0.61–1.24)
CREATININE: 1.5 mg/dL — AB (ref 0.61–1.24)
CREATININE: 1.6 mg/dL — AB (ref 0.61–1.24)
CREATININE: 1.7 mg/dL — AB (ref 0.61–1.24)
CREATININE: 1.7 mg/dL — AB (ref 0.61–1.24)
CREATININE: 2 mg/dL — AB (ref 0.61–1.24)
CREATININE: 2 mg/dL — AB (ref 0.61–1.24)
CREATININE: 2 mg/dL — AB (ref 0.61–1.24)
CREATININE: 2.1 mg/dL — AB (ref 0.61–1.24)
CREATININE: 2.1 mg/dL — AB (ref 0.61–1.24)
Calcium, Ion: 0.58 mmol/L — CL (ref 1.13–1.30)
Calcium, Ion: 1.09 mmol/L — ABNORMAL LOW (ref 1.13–1.30)
Calcium, Ion: 0.63 mmol/L — CL (ref 1.13–1.30)
Calcium, Ion: 1.11 mmol/L — ABNORMAL LOW (ref 1.13–1.30)
Calcium, Ion: 1.13 mmol/L (ref 1.13–1.30)
Calcium, Ion: 1.27 mmol/L (ref 1.13–1.30)
Calcium, Ion: 0.49 mmol/L — CL (ref 1.13–1.30)
Calcium, Ion: 1.12 mmol/L — ABNORMAL LOW (ref 1.13–1.30)
Calcium, Ion: 0.46 mmol/L — CL (ref 1.13–1.30)
Calcium, Ion: 0.49 mmol/L — CL (ref 1.13–1.30)
Chloride: 92 mmol/L — ABNORMAL LOW (ref 101–111)
Chloride: 91 mmol/L — ABNORMAL LOW (ref 101–111)
Chloride: 89 mmol/L — ABNORMAL LOW (ref 101–111)
Creatinine, Ser: 1.6 mg/dL — ABNORMAL HIGH (ref 0.61–1.24)
Creatinine, Ser: 1.6 mg/dL — ABNORMAL HIGH (ref 0.61–1.24)
Creatinine, Ser: 1.9 mg/dL — ABNORMAL HIGH (ref 0.61–1.24)
Creatinine, Ser: 1.5 mg/dL — ABNORMAL HIGH (ref 0.61–1.24)
GLUCOSE: 345 mg/dL — AB (ref 65–99)
GLUCOSE: 345 mg/dL — AB (ref 65–99)
GLUCOSE: 382 mg/dL — AB (ref 65–99)
GLUCOSE: 387 mg/dL — AB (ref 65–99)
GLUCOSE: 394 mg/dL — AB (ref 65–99)
GLUCOSE: 406 mg/dL — AB (ref 65–99)
Glucose, Bld: 335 mg/dL — ABNORMAL HIGH (ref 65–99)
Glucose, Bld: 340 mg/dL — ABNORMAL HIGH (ref 65–99)
Glucose, Bld: 369 mg/dL — ABNORMAL HIGH (ref 65–99)
Glucose, Bld: 358 mg/dL — ABNORMAL HIGH (ref 65–99)
Glucose, Bld: 369 mg/dL — ABNORMAL HIGH (ref 65–99)
Glucose, Bld: 401 mg/dL — ABNORMAL HIGH (ref 65–99)
Glucose, Bld: 422 mg/dL — ABNORMAL HIGH (ref 65–99)
Glucose, Bld: 423 mg/dL — ABNORMAL HIGH (ref 65–99)
HCT: 29 % — ABNORMAL LOW (ref 39.0–52.0)
HCT: 31 % — ABNORMAL LOW (ref 39.0–52.0)
HCT: 27 % — ABNORMAL LOW (ref 39.0–52.0)
HCT: 32 % — ABNORMAL LOW (ref 39.0–52.0)
HCT: 34 % — ABNORMAL LOW (ref 39.0–52.0)
HCT: 32 % — ABNORMAL LOW (ref 39.0–52.0)
HCT: 31 % — ABNORMAL LOW (ref 39.0–52.0)
HEMATOCRIT: 28 % — AB (ref 39.0–52.0)
HEMATOCRIT: 29 % — AB (ref 39.0–52.0)
HEMATOCRIT: 28 % — AB (ref 39.0–52.0)
HEMATOCRIT: 33 % — AB (ref 39.0–52.0)
HEMATOCRIT: 32 % — AB (ref 39.0–52.0)
HEMATOCRIT: 30 % — AB (ref 39.0–52.0)
HEMATOCRIT: 32 % — AB (ref 39.0–52.0)
HEMOGLOBIN: 9.5 g/dL — AB (ref 13.0–17.0)
HEMOGLOBIN: 10.5 g/dL — AB (ref 13.0–17.0)
Hemoglobin: 10.5 g/dL — ABNORMAL LOW (ref 13.0–17.0)
Hemoglobin: 9.9 g/dL — ABNORMAL LOW (ref 13.0–17.0)
Hemoglobin: 10.9 g/dL — ABNORMAL LOW (ref 13.0–17.0)
Hemoglobin: 9.2 g/dL — ABNORMAL LOW (ref 13.0–17.0)
Hemoglobin: 9.5 g/dL — ABNORMAL LOW (ref 13.0–17.0)
Hemoglobin: 9.9 g/dL — ABNORMAL LOW (ref 13.0–17.0)
Hemoglobin: 11.2 g/dL — ABNORMAL LOW (ref 13.0–17.0)
Hemoglobin: 11.6 g/dL — ABNORMAL LOW (ref 13.0–17.0)
Hemoglobin: 10.9 g/dL — ABNORMAL LOW (ref 13.0–17.0)
Hemoglobin: 10.9 g/dL — ABNORMAL LOW (ref 13.0–17.0)
Hemoglobin: 10.9 g/dL — ABNORMAL LOW (ref 13.0–17.0)
Hemoglobin: 10.2 g/dL — ABNORMAL LOW (ref 13.0–17.0)
POTASSIUM: 3.8 mmol/L (ref 3.5–5.1)
POTASSIUM: 3.7 mmol/L (ref 3.5–5.1)
POTASSIUM: 3.8 mmol/L (ref 3.5–5.1)
POTASSIUM: 3.9 mmol/L (ref 3.5–5.1)
POTASSIUM: 4.2 mmol/L (ref 3.5–5.1)
POTASSIUM: 3.7 mmol/L (ref 3.5–5.1)
Potassium: 3.9 mmol/L (ref 3.5–5.1)
Potassium: 3.9 mmol/L (ref 3.5–5.1)
Potassium: 3.6 mmol/L (ref 3.5–5.1)
Potassium: 3.8 mmol/L (ref 3.5–5.1)
Potassium: 3.8 mmol/L (ref 3.5–5.1)
Potassium: 3.9 mmol/L (ref 3.5–5.1)
Potassium: 3.8 mmol/L (ref 3.5–5.1)
Potassium: 4 mmol/L (ref 3.5–5.1)
SODIUM: 133 mmol/L — AB (ref 135–145)
SODIUM: 134 mmol/L — AB (ref 135–145)
SODIUM: 135 mmol/L (ref 135–145)
SODIUM: 135 mmol/L (ref 135–145)
Sodium: 130 mmol/L — ABNORMAL LOW (ref 135–145)
Sodium: 130 mmol/L — ABNORMAL LOW (ref 135–145)
Sodium: 131 mmol/L — ABNORMAL LOW (ref 135–145)
Sodium: 133 mmol/L — ABNORMAL LOW (ref 135–145)
Sodium: 134 mmol/L — ABNORMAL LOW (ref 135–145)
Sodium: 130 mmol/L — ABNORMAL LOW (ref 135–145)
Sodium: 134 mmol/L — ABNORMAL LOW (ref 135–145)
Sodium: 134 mmol/L — ABNORMAL LOW (ref 135–145)
Sodium: 130 mmol/L — ABNORMAL LOW (ref 135–145)
Sodium: 134 mmol/L — ABNORMAL LOW (ref 135–145)
TCO2: 23 mmol/L (ref 0–100)
TCO2: 24 mmol/L (ref 0–100)
TCO2: 24 mmol/L (ref 0–100)
TCO2: 25 mmol/L (ref 0–100)
TCO2: 26 mmol/L (ref 0–100)
TCO2: 26 mmol/L (ref 0–100)
TCO2: 26 mmol/L (ref 0–100)
TCO2: 26 mmol/L (ref 0–100)
TCO2: 25 mmol/L (ref 0–100)
TCO2: 27 mmol/L (ref 0–100)
TCO2: 26 mmol/L (ref 0–100)
TCO2: 25 mmol/L (ref 0–100)
TCO2: 27 mmol/L (ref 0–100)
TCO2: 27 mmol/L (ref 0–100)

## 2015-02-12 LAB — RENAL FUNCTION PANEL
ALBUMIN: 1.9 g/dL — AB (ref 3.5–5.0)
ALBUMIN: 1.8 g/dL — AB (ref 3.5–5.0)
ANION GAP: 7 (ref 5–15)
Anion gap: 11 (ref 5–15)
BUN: 36 mg/dL — ABNORMAL HIGH (ref 6–20)
BUN: 34 mg/dL — AB (ref 6–20)
CALCIUM: 8.8 mg/dL — AB (ref 8.9–10.3)
CO2: 30 mmol/L (ref 22–32)
CO2: 29 mmol/L (ref 22–32)
CREATININE: 2.18 mg/dL — AB (ref 0.61–1.24)
Calcium: 9.1 mg/dL (ref 8.9–10.3)
Chloride: 94 mmol/L — ABNORMAL LOW (ref 101–111)
Chloride: 92 mmol/L — ABNORMAL LOW (ref 101–111)
Creatinine, Ser: 2.08 mg/dL — ABNORMAL HIGH (ref 0.61–1.24)
GFR calc Af Amer: 36 mL/min — ABNORMAL LOW (ref >60)
GFR calc non Af Amer: 29 mL/min — ABNORMAL LOW (ref >60)
GFR calc non Af Amer: 31 mL/min — ABNORMAL LOW (ref >60)
GFR, EST AFRICAN AMERICAN: 34 mL/min — AB (ref >60)
GLUCOSE: 327 mg/dL — AB (ref 65–99)
GLUCOSE: 391 mg/dL — AB (ref 65–99)
PHOSPHORUS: 3.1 mg/dL (ref 2.5–4.6)
PHOSPHORUS: 2.6 mg/dL (ref 2.5–4.6)
POTASSIUM: 4 mmol/L (ref 3.5–5.1)
Potassium: 4.2 mmol/L (ref 3.5–5.1)
SODIUM: 131 mmol/L — AB (ref 135–145)
SODIUM: 132 mmol/L — AB (ref 135–145)

## 2015-02-12 LAB — GLUCOSE, CAPILLARY
GLUCOSE-CAPILLARY: 344 mg/dL — AB (ref 65–99)
GLUCOSE-CAPILLARY: 307 mg/dL — AB (ref 65–99)
GLUCOSE-CAPILLARY: 362 mg/dL — AB (ref 65–99)
Glucose-Capillary: 340 mg/dL — ABNORMAL HIGH (ref 65–99)
Glucose-Capillary: 367 mg/dL — ABNORMAL HIGH (ref 65–99)
Glucose-Capillary: 400 mg/dL — ABNORMAL HIGH (ref 65–99)
Glucose-Capillary: 384 mg/dL — ABNORMAL HIGH (ref 65–99)

## 2015-02-12 LAB — CBC
HCT: 26.2 % — ABNORMAL LOW (ref 39.0–52.0)
HEMOGLOBIN: 8 g/dL — AB (ref 13.0–17.0)
MCH: 27.7 pg (ref 26.0–34.0)
MCHC: 30.5 g/dL (ref 30.0–36.0)
MCV: 90.7 fL (ref 78.0–100.0)
Platelets: 250 10*3/uL (ref 150–400)
RBC: 2.89 MIL/uL — AB (ref 4.22–5.81)
RDW: 19 % — ABNORMAL HIGH (ref 11.5–15.5)
WBC: 21.6 10*3/uL — AB (ref 4.0–10.5)

## 2015-02-12 LAB — TYPE AND SCREEN
ABO/RH(D): O NEG
Antibody Screen: NEGATIVE
Unit division: 0
Unit division: 0

## 2015-02-12 LAB — BLOOD GAS, ARTERIAL
ACID-BASE EXCESS: 3.7 mmol/L — AB (ref 0.0–2.0)
BICARBONATE: 27.9 meq/L — AB (ref 20.0–24.0)
Drawn by: 295031
FIO2: 0.4
LHR: 12 {breaths}/min
O2 Saturation: 94.8 %
PATIENT TEMPERATURE: 98.6
PEEP/CPAP: 5 cmH2O
TCO2: 26.3 mmol/L (ref 0–100)
VT: 510 mL
pCO2 arterial: 43 mmHg (ref 35.0–45.0)
pH, Arterial: 7.428 (ref 7.350–7.450)
pO2, Arterial: 77.8 mmHg — ABNORMAL LOW (ref 80.0–100.0)

## 2015-02-12 LAB — APTT: aPTT: 34 seconds (ref 24–37)

## 2015-02-12 LAB — PHOSPHORUS: PHOSPHORUS: 3.1 mg/dL (ref 2.5–4.6)

## 2015-02-12 LAB — MAGNESIUM: MAGNESIUM: 2.2 mg/dL (ref 1.7–2.4)

## 2015-02-12 LAB — CORTISOL: Cortisol, Plasma: 18.2 ug/dL

## 2015-02-12 MED ORDER — FENTANYL BOLUS VIA INFUSION
25.0000 ug | INTRAVENOUS | Status: DC | PRN
  Administered 2015-02-12 – 2015-02-14 (×7): 25 ug via INTRAVENOUS
  Filled 2015-02-12: qty 25

## 2015-02-12 MED ORDER — HYDROCORTISONE NA SUCCINATE PF 100 MG IJ SOLR
50.0000 mg | Freq: Four times a day (QID) | INTRAMUSCULAR | Status: DC
  Administered 2015-02-12 – 2015-02-14 (×9): 50 mg via INTRAVENOUS
  Filled 2015-02-12 (×9): qty 2

## 2015-02-12 MED ORDER — INSULIN ASPART 100 UNIT/ML ~~LOC~~ SOLN
0.0000 [IU] | SUBCUTANEOUS | Status: DC
  Administered 2015-02-12 – 2015-02-13 (×4): 20 [IU] via SUBCUTANEOUS

## 2015-02-12 MED ORDER — SODIUM CHLORIDE 0.9 % IV SOLN
500.0000 mg | Freq: Four times a day (QID) | INTRAVENOUS | Status: DC
  Administered 2015-02-12 – 2015-02-15 (×12): 500 mg via INTRAVENOUS
  Filled 2015-02-12 (×15): qty 0.5

## 2015-02-12 MED ORDER — FENTANYL CITRATE (PF) 100 MCG/2ML IJ SOLN
50.0000 ug | Freq: Once | INTRAMUSCULAR | Status: AC
  Administered 2015-02-12: 50 ug via INTRAVENOUS

## 2015-02-12 MED ORDER — CALCIUM GLUCONATE 10 % IV SOLN
2.0000 g | Freq: Once | INTRAVENOUS | Status: AC
  Administered 2015-02-12: 2 g via INTRAVENOUS
  Filled 2015-02-12: qty 20

## 2015-02-12 MED ORDER — POTASSIUM CHLORIDE 10 MEQ/50ML IV SOLN
10.0000 meq | INTRAVENOUS | Status: AC
  Administered 2015-02-12 (×2): 10 meq via INTRAVENOUS
  Filled 2015-02-12 (×2): qty 50

## 2015-02-12 MED ORDER — SODIUM CHLORIDE 0.9 % IV SOLN
25.0000 ug/h | INTRAVENOUS | Status: DC
  Administered 2015-02-12 (×3): 50 ug/h via INTRAVENOUS
  Administered 2015-02-13: 325 ug/h via INTRAVENOUS
  Administered 2015-02-13: 50 ug/h via INTRAVENOUS
  Administered 2015-02-13: 150 ug/h via INTRAVENOUS
  Filled 2015-02-12 (×5): qty 50

## 2015-02-12 MED ORDER — INSULIN GLARGINE 100 UNIT/ML ~~LOC~~ SOLN
40.0000 [IU] | Freq: Every day | SUBCUTANEOUS | Status: DC
  Administered 2015-02-12: 40 [IU] via SUBCUTANEOUS
  Filled 2015-02-12: qty 0.4

## 2015-02-12 MED ORDER — TRACE MINERALS CR-CU-MN-SE-ZN 10-1000-500-60 MCG/ML IV SOLN
INTRAVENOUS | Status: AC
  Administered 2015-02-12: 17:00:00 via INTRAVENOUS
  Filled 2015-02-12: qty 2640

## 2015-02-12 NOTE — Progress Notes (Signed)
ANTIBIOTIC CONSULT NOTE  Pharmacy Consult for Meropenem Indication: infected hematoma s/p cholecystectomy/IAI  No Known Allergies  Patient Measurements: Height:  (167.6 cm) Weight: 258 lb 9.6 oz (117.3 kg) IBW/kg (Calculated) : 63.8   Vital Signs: Temp: 97.3 F (36.3 C) (08/27 0800) Temp Source: Axillary (08/27 0800) BP: 122/63 mmHg (08/27 0716) Pulse Rate: 59 (08/27 0716) Intake/Output from previous day: 08/26 0701 - 08/27 0700 In: 5696.9 [I.V.:2580.2; NG/GT:81.7; IV Piggyback:250; TPN:2695] Out: 4501 [Urine:24] Intake/Output from this shift: Total I/O In: 768.6 [I.V.:418.6; Other:20; TPN:330] Out: 870 [Urine:12; Emesis/NG output:50; Other:808]  Labs:  Recent Labs  02/10/15 0452 02/11/15 0600  02/12/15 0650 02/12/15 0830 02/12/15 0852  WBC 20.1* 22.3*  --  21.6*  --   --   HGB 7.9* 8.5*  < > 8.0* 9.2* 10.9*  PLT 367 413*  --  250  --   --   CREATININE 2.98* 2.56*  < > 2.18* 2.00* 1.60*  < > = values in this interval not displayed. Estimated Creatinine Clearance: 53.3 mL/min (by C-G formula based on Cr of 1.6). No results for input(s): VANCOTROUGH, VANCOPEAK, VANCORANDOM, GENTTROUGH, GENTPEAK, GENTRANDOM, TOBRATROUGH, TOBRAPEAK, TOBRARND, AMIKACINPEAK, AMIKACINTROU, AMIKACIN in the last 72 hours.   Microbiology: Recent Results (from the past 720 hour(s))  Culture, routine-abscess     Status: None   Collection Time: 01/20/15  1:00 PM  Result Value Ref Range Status   Specimen Description ABSCESS GALL BLADDER FOSSA  Final   Special Requests Normal  Final   Gram Stain   Final    ABUNDANT WBC PRESENT,BOTH PMN AND MONONUCLEAR NO SQUAMOUS EPITHELIAL CELLS SEEN NO ORGANISMS SEEN Performed at [facility]    Culture   Final    MULTIPLE ORGANISMS PRESENT, NONE PREDOMINANT Note: NO STAPHYLOCOCCUS AUREUS ISOLATED NO GROUP A STREP (S.PYOGENES) ISOLATED Performed at [facility]    Report Status 01/23/2015 FINAL  Final  Anaerobic culture      Status: None   Collection Time: 01/20/15  1:00 PM  Result Value Ref Range Status   Specimen Description ABSCESS GALL BLADDER FOSSA  Final   Special Requests Normal  Final   Gram Stain   Final    ABUNDANT WBC PRESENT,BOTH PMN AND MONONUCLEAR NO SQUAMOUS EPITHELIAL CELLS SEEN NO ORGANISMS SEEN Performed at [facility]    Culture   Final    NO ANAEROBES ISOLATED Performed at [facility]    Report Status 01/26/2015 FINAL  Final  MRSA PCR Screening     Status: None   Collection Time: 01/20/15  1:39 PM  Result Value Ref Range Status   MRSA by PCR NEGATIVE NEGATIVE Final    Comment:        The GeneXpert MRSA Assay (FDA approved for NASAL specimens only), is one component of a comprehensive MRSA colonization surveillance program. It is not intended to diagnose MRSA infection nor to guide or monitor treatment for MRSA infections. Performed at [facility]   Culture, blood (routine x 2)     Status: None   Collection Time: 01/20/15  1:50 PM  Result Value Ref Range Status   Specimen Description BLOOD RIGHT ARM  Final   Special Requests IN PEDIATRIC BOTTLE 4CC  Final   Culture   Final    NO GROWTH 5 DAYS Performed at [facility]    Report Status 01/25/2015 FINAL  Final  Culture, blood (routine x 2)     Status: None   Collection Time: 01/20/15  1:55  PM  Result Value Ref Range Status   Specimen Description BLOOD RIGHT HAND  Final   Special Requests IN PEDIATRIC BOTTLE 4CC  Final   Culture   Final    NO GROWTH 5 DAYS Performed at [facility]    Report Status 01/25/2015 FINAL  Final  Urine culture     Status: None   Collection Time: 01/30/15 11:40 AM  Result Value Ref Range Status   Specimen Description URINE, RANDOM  Final   Special Requests NONE  Final   Culture   Final    >=100,000 COLONIES/mL YEAST Performed at [facility]    Report Status 02/01/2015 FINAL  Final  Culture, body fluid-bottle     Status: None (Preliminary  result)   Collection Time: 02/09/15  2:10 PM  Result Value Ref Range Status   Specimen Description FLUID PERITONEAL  Final   Special Requests BOTTLES DRAWN AEROBIC AND ANAEROBIC 10CC  Final   Culture   Final    NO GROWTH 2 DAYS Performed at [facility]    Report Status PENDING  Incomplete  Gram stain     Status: None   Collection Time: 02/09/15  2:10 PM  Result Value Ref Range Status   Specimen Description FLUID PERITONEAL  Final   Special Requests BOTTLES DRAWN AEROBIC AND ANAEROBIC 10CC  Final   Gram Stain   Final    ABUNDANT WBC PRESENT,BOTH PMN AND MONONUCLEAR NO ORGANISMS SEEN Performed at [facility]    Report Status 02/09/2015 FINAL  Final  Culture, blood (routine x 2)     Status: None (Preliminary result)   Collection Time: 02/10/15  4:00 PM  Result Value Ref Range Status   Specimen Description BLOOD LEFT ANTECUBITAL  Final   Special Requests BOTTLES DRAWN AEROBIC ONLY 7CC  Final   Culture   Final    NO GROWTH < 24 HOURS Performed at [facility]    Report Status PENDING  Incomplete  Culture, blood (routine x 2)     Status: None (Preliminary result)   Collection Time: 02/10/15  4:15 PM  Result Value Ref Range Status   Specimen Description BLOOD LEFT FOREARM  Final   Special Requests BOTTLES DRAWN AEROBIC ONLY 5CC  Final   Culture   Final    NO GROWTH < 24 HOURS Performed at [facility]    Report Status PENDING  Incomplete   Assessment: 57 yoM presents with increased abdominal pain since cholecystectomy last month. Patient was discharged with a drain in place which was removed last week.  Patient transferred from [facility] on 8/3 with septic shock likely due to infected hematoma in the GB fossa. Antibiotics started for intra-abdominal infection, possible UTI.   8/3 >> Vanc >>  8/10 8/3 >> Zosyn >> 8/24 8/17 >> Diflucan >> 8/23 8/24 >> Meropenem >> 8/25 >> Anidulafungin >>  8/4 blood x 2: neg FINAL 8/4 GB fossa abscess: multiple  organisms present, none predominant. No Staph aureus or Group A strep (S. Pyogenes) isolated 8/4 abscess (anaerobic): neg FINAL 8/4 MRSA PCR: negative 8/14 urine: yeast-final 8/24 peritoneal fluid: ngtd 8/25 blood x 2: ngtd  Plan:  Day 25 antibiotics  Dosing recommendations vary widely for meropenem with CRRT.  Current effluent rate 2700 ml/hr is at low end for CRRT (approximating CrCl ~25 ml/min).    With clinical decline, will opt for more frequent dosing given time-dependent killing.  Increasing to 500mg  q6h. No Hx seizures noted.  F/u SCr,  clinical course, cultures as available.  Loralee Pacas, PharmD, BCPS Pager: [phone]  02/12/2015, 10:40 AM

## 2015-02-12 NOTE — Progress Notes (Signed)
Filter changed at 11:10 d/t high filter pressures. Blood returned prior to filter change. CRRT resumed at 12:00pm. Will continue to monitor.

## 2015-02-12 NOTE — Progress Notes (Addendum)
General Surgery Note  LOS: 24 days  POD -     Assessment/Plan: 1. Lap chole with IOC - 01/04/2015 - [facility]  Readmitted 01/19/2015 - Post op course complicated by intaabdominal abscess - has drain in RUQ, but with essentially no drainage  WBC - 21,600 - 02/11/2015  [facility]is  Last CT of abdomen - 02/07/2015 - complex gall bladder collection, otherwise stable  No change from general surgery standpoint   2.  VDRF 3.  ARF - Creatinine - 2.18 - 02/12/2015  on CVVH 4.  DM  Glucose - 327 - 02/12/2015 5.  CAD  History of A. Fib 6.  Anemia  Hgb - 8.0 - 02/12/2015   Principal Problem:   Septic shock Active Problems:   Anticoagulated   Paroxysmal a-fib   Acute blood loss anemia   Catheter-associated urinary tract infection   Intra-abdominal infection   Candidiasis of urogenital site   Acute respiratory failure with hypoxia   Infected hematoma following procedure   Leukocytosis   Diabetes mellitus with renal complications   Anemia of chronic disease   Hypokalemia   Hypomagnesemia   Acute renal failure   Protein-calorie malnutrition, severe   AKI (acute kidney injury)   Encounter for palliative care   Acute encephalopathy   Subjective:  Intubated and sedated.    No family in room. Objective:   Filed Vitals:   02/12/15 0716  BP: 122/63  Pulse: 59  Temp:   Resp: 19     Intake/Output from previous day:  08/26 0701 - 08/27 0700 In: 5696.9 [I.V.:2580.2; NG/GT:81.7; IV Piggyback:250; TPN:2695] Out: 4501 [Urine:24]  Intake/Output this shift:      Physical Exam:   General: WM who is intubated   HEENT: Normal. Left IJ for CVVH. .   Lungs: Clear   Abdomen: Rare BS.   Wound: Drain in RUQ, not much coming out.     Lab Results:    Recent Labs  02/11/15 0600  02/12/15 0127 02/12/15 0650  WBC 22.3*  --   --  21.6*  HGB 8.5*  < > 10.5* 8.0*  HCT 26.2*  < > 31.0* 26.2*  PLT 413*  --   --  250  < > = values in this interval not displayed.  BMET   Recent Labs   02/11/15 1620  02/12/15 0127 02/12/15 0650  NA 132*  < > 133* 131*  K 4.4  < > 3.9 4.2  CL 101  < > 91* 94*  CO2 25  --   --  30  GLUCOSE 355*  < > 387* 327*  BUN 36*  < > 29* 36*  CREATININE 2.56*  < > 1.70* 2.18*  CALCIUM 8.0*  --   --  8.8*  < > = values in this interval not displayed.  PT/INR  No results for input(s): LABPROT, INR in the last 72 hours.  ABG   Recent Labs  02/11/15 0002 02/11/15 0250  PHART 7.352 7.319*  HCO3 20.3 21.1     Studies/Results:  Dg Chest Port 1 View  02/11/2015   CLINICAL DATA:  Central line placement.  EXAM: PORTABLE CHEST - 1 VIEW  COMPARISON:  02/10/2015  FINDINGS: Interval placement of endotracheal tube with tip measuring 2.3 cm above the carina. Enteric tube was placed with tip off the field of view but below the left hemidiaphragm. Left central venous catheter was placed with tip over the low SVC region. No pneumothorax. Shallow inspiration. Mild cardiac enlargement. Linear atelectasis in the  right lung base. No consolidation in the lungs. No blunting of costophrenic angles. No pneumothorax. Degenerative changes in the spine and shoulders.  IMPRESSION: Appliances appear to be in satisfactory position. Shallow inspiration with atelectasis in the right lung base. Cardiac enlargement.   Electronically Signed   By: Burman Nieves M.D.   On: 02/11/2015 03:04   Dg Chest Port 1 View  02/10/2015   CLINICAL DATA:  Projectile vomiting and difficulty breathing  EXAM: PORTABLE CHEST - 1 VIEW  COMPARISON:  02/08/2015  FINDINGS: Left jugular central line and right-sided PICC line are again identified and stable. The overall inspiratory effort is poor. The left lung is clear. Elevation of the right hemidiaphragm is noted. Mild vascular crowding is seen. Cardiac shadow is enlarged.  IMPRESSION: Poor inspiratory effort with vascular crowding. No focal confluent infiltrate is seen.   Electronically Signed   By: Alcide Clever M.D.   On: 02/10/2015 11:35      Anti-infectives:   Anti-infectives    Start     Dose/Rate Route Frequency Ordered Stop   02/11/15 1800  anidulafungin (ERAXIS) 100 mg in sodium chloride 0.9 % 100 mL IVPB     100 mg over 90 Minutes Intravenous Every 24 hours 02/10/15 1744     02/10/15 1730  anidulafungin (ERAXIS) 200 mg in sodium chloride 0.9 % 200 mL IVPB     200 mg over 180 Minutes Intravenous  Once 02/10/15 1720 02/10/15 2230   02/10/15 0200  meropenem (MERREM) 500 mg in sodium chloride 0.9 % 50 mL IVPB     500 mg 100 mL/hr over 30 Minutes Intravenous Every 8 hours 02/09/15 1808     02/10/15 0000  meropenem (MERREM) 500 mg in sodium chloride 0.9 % 50 mL IVPB  Status:  Discontinued     500 mg 100 mL/hr over 30 Minutes Intravenous 4 times per day 02/09/15 1655 02/09/15 1808   02/09/15 1800  meropenem (MERREM) 1 g in sodium chloride 0.9 % 100 mL IVPB     1 g 200 mL/hr over 30 Minutes Intravenous  Once 02/09/15 1655 02/09/15 1818   02/08/15 1800  piperacillin-tazobactam (ZOSYN) IVPB 3.375 g  Status:  Discontinued     3.375 g 100 mL/hr over 30 Minutes Intravenous 4 times per day 02/08/15 1403 02/09/15 1655   02/07/15 1400  piperacillin-tazobactam (ZOSYN) IVPB 2.25 g  Status:  Discontinued     2.25 g 100 mL/hr over 30 Minutes Intravenous 3 times per day 02/07/15 0750 02/08/15 1403   02/04/15 1200  piperacillin-tazobactam (ZOSYN) IVPB 2.25 g  Status:  Discontinued     2.25 g 100 mL/hr over 30 Minutes Intravenous 4 times per day 02/04/15 0859 02/04/15 0905   02/04/15 1200  piperacillin-tazobactam (ZOSYN) IVPB 3.375 g  Status:  Discontinued     3.375 g 12.5 mL/hr over 240 Minutes Intravenous Every 8 hours 02/04/15 0905 02/07/15 0750   02/02/15 2000  fluconazole (DIFLUCAN) tablet 100 mg     100 mg Oral Every 24 hours 02/02/15 1908 02/08/15 2041   02/02/15 1800  fluconazole (DIFLUCAN) tablet 100 mg  Status:  Discontinued     100 mg Oral Daily 02/02/15 1545 02/02/15 1901   01/31/15 1000  fluconazole (DIFLUCAN) tablet  100 mg  Status:  Discontinued     100 mg Oral Daily 01/31/15 0727 02/02/15 1313   01/29/15 1700  piperacillin-tazobactam (ZOSYN) IVPB 2.25 g  Status:  Discontinued     2.25 g 100 mL/hr over 30 Minutes Intravenous Every  8 hours 01/29/15 0845 02/04/15 0859   01/24/15 2200  vancomycin (VANCOCIN) IVPB 1000 mg/200 mL premix     1,000 mg 200 mL/hr over 60 Minutes Intravenous  Once 01/24/15 1349 01/24/15 2301   01/21/15 2000  vancomycin (VANCOCIN) 1,500 mg in sodium chloride 0.9 % 500 mL IVPB  Status:  Discontinued     1,500 mg 250 mL/hr over 120 Minutes Intravenous Every 24 hours 01/21/15 1048 01/23/15 1726   01/20/15 2000  vancomycin (VANCOCIN) 1,250 mg in sodium chloride 0.9 % 250 mL IVPB  Status:  Discontinued     1,250 mg 166.7 mL/hr over 90 Minutes Intravenous Every 24 hours 01/19/15 1945 01/20/15 0843   01/20/15 2000  vancomycin (VANCOCIN) 1,250 mg in sodium chloride 0.9 % 250 mL IVPB  Status:  Discontinued     1,250 mg 166.7 mL/hr over 90 Minutes Intravenous Every 24 hours 01/20/15 1519 01/21/15 1048   01/20/15 0000  piperacillin-tazobactam (ZOSYN) IVPB 3.375 g  Status:  Discontinued     3.375 g 12.5 mL/hr over 240 Minutes Intravenous Every 8 hours 01/19/15 1946 01/29/15 0845   01/19/15 1715  vancomycin (VANCOCIN) 2,500 mg in sodium chloride 0.9 % 500 mL IVPB     2,500 mg 250 mL/hr over 120 Minutes Intravenous  Once 01/19/15 1707 01/19/15 2159   01/19/15 1715  piperacillin-tazobactam (ZOSYN) IVPB 3.375 g     3.375 g 100 mL/hr over 30 Minutes Intravenous  Once 01/19/15 1707 01/19/15 1900      Ovidio Kin, MD, FACS Pager: [phone] [facility] Office: [phone] 02/12/2015

## 2015-02-12 NOTE — Progress Notes (Signed)
PARENTERAL NUTRITION CONSULT NOTE   Pharmacy Consult for TPN Indication: Prolonged ileus  No Known Allergies  Patient Measurements: Height: 5\' 6"  (167.6 cm) Weight: 258 lb 9.6 oz (117.3 kg) IBW/kg (Calculated) : 63.8 Adjusted Body Weight: 78kg Usual Weight:   Vital Signs: Temp: 97.3 F (36.3 C) (08/27 0800) Temp Source: Axillary (08/27 0800) BP: 122/63 mmHg (08/27 0716) Pulse Rate: 59 (08/27 0716) Intake/Output from previous day: 08/26 0701 - 08/27 0700 In: 5696.9 [I.V.:2580.2; NG/GT:81.7; IV Piggyback:250; TPN:2695] Out: 4501 [Urine:24] Intake/Output from this shift: Total I/O In: 768.6 [I.V.:418.6; Other:20; TPN:330] Out: 870 [Urine:12; Emesis/NG output:50; Other:808]  Labs:  Recent Labs  02/10/15 0452 02/11/15 0600  02/12/15 0650 02/12/15 0830 02/12/15 0852  WBC 20.1* 22.3*  --  21.6*  --   --   HGB 7.9* 8.5*  < > 8.0* 9.2* 10.9*  HCT 24.5* 26.2*  < > 26.2* 27.0* 32.0*  PLT 367 413*  --  250  --   --   APTT 163* 37  --  34  --   --   < > = values in this interval not displayed.   Recent Labs  02/10/15 0452 02/11/15 0600 02/11/15 1620  02/12/15 0650 02/12/15 0830 02/12/15 0852  NA 134* 131* 132*  < > 131* 131* 134*  K 4.0 4.2 4.4  < > 4.2 3.7 3.8  CL 99* 100* 101  < > 94* 92* 92*  CO2 25 23 25   --  30  --   --   GLUCOSE 152* 275* 355*  < > 327* 345* 345*  BUN 30* 32* 36*  < > 36* 33* 27*  CREATININE 2.98* 2.56* 2.56*  < > 2.18* 2.00* 1.60*  CALCIUM 8.3* 8.0* 8.0*  --  8.8*  --   --   MG 2.2 2.2  --   --  2.2  --   --   PHOS 2.2* 2.5 2.6  --  3.1  3.1  --   --   PROT 7.3  --   --   --   --   --   --   ALBUMIN 2.3* 2.2* 2.2*  --  1.9*  --   --   AST 78*  --   --   --   --   --   --   ALT 37  --   --   --   --   --   --   ALKPHOS 138*  --   --   --   --   --   --   BILITOT 1.3*  --   --   --   --   --   --   < > = values in this interval not displayed. Estimated Creatinine Clearance: 53.3 mL/min (by C-G formula based on Cr of 1.6).    Recent  Labs  02/11/15 1245 02/11/15 1621 02/12/15 0822  GLUCAP 310* 334* 307*    Insulin Requirements:  78 units novolog/24h, lantus 35 units qhs, 45 units regular insulin in TPN - stress dose hydrocortisone started 8/27 - calcium gluconate and phenylephrine drips mixed in dextrose  Current Nutrition: NPO on vent, unable to tolerate TF  IVF: NS KVO  Central access: PICC  TPN start date:  8/22  ASSESSMENT  HPI: 78 YOM presents with worsening abdominal pain on 8/3.  Recent cholecystectomy on 01/04/2015 with drain removed 2 weeks prior to admission. Found to have GB fossa fluid collection (infected hematoma) and abscess. IR placed GB fossa drain.  Pharmacy asked to start TPN 8/22 for prolonged ileus and interment vomiting prohibiting use of enteral nutrition  Significant events:  8/3 septic shock from infected GB fossa 8/22 orders to start TPN 8/23 Start CRRT 8/25 intubated  Today:    Glucose - uncontrolled hyperglycemia  Electrolytes:  HypoNa improved (134)  K+ WNL but decreasing (3.8)  Mg/Phos WNL  Corr Ca wnl (10.4) but ionized Ca low (1.09, 0.63) on continuous CaGluc infusion  Renal - oliguric AKI. CRRT  8/23  24h I/O = +853ml,   SCr improving (1.6)  LFTs - AST = 58, ALT WNL, Tbili WNL  TGs - 155 (8/23)  Prealbumin - 5.3 (8/22)  NUTRITIONAL GOALS                                                                                             Updated RD recs with CRRT and ventilator 8/26: 2129 kcals, protein 150-170 g (fluid 1.5L/day) Clinimix 5/15 at a goal rate of 159ml/hr + 20% fat emulsion at 4ml/hr to provide: 132g/day protein, 2354Kcal/day.  Will be unable to meet current goal protein needs with premixed Clinimix formulation   PLAN                                                                                                              Calcium Gluconate  2g IV x 1 Potassium Chloride IV x 2  At 1800 today:  Continue Clinimix 5/15 (WITH electrolytes) at 149ml/hr - this is not providing 100% protein needs but do not want to worsen fluid overload at this time    Increase regular insulin in TPN so that patient receives 70 units regular insulin. Increase SSI to resistant scale. CCM increased Lantus to 40 units qHS  Hold lipid emulsions as changed to ICU status as of 8/23 (hold lipids for the first week of ICU admission).   Monitor for signs of refeeding syndrome - none currently  TPN to contain standard multivitamins and trace elements.  Daily renal function panel, Mg and Phos ordered per MD   F/u daily.  Loralee Pacas, PharmD, BCPS Pager: [phone]  02/12/2015,11:08 AM

## 2015-02-12 NOTE — Progress Notes (Signed)
Referring Physician(s): CCS  Chief Complaint:  F/U for Perc GB fossa drain  Subjective:  Patient is s/p Lap chole with IOC - 01/04/2015 - [facility]  Readmitted 01/19/2015 - Post op course complicated by intaabdominal abscess -  S/P RUQ perc drain to GB fossa on 8/4, but with essentially no drainage, upsized on 8/18.  Last CT of abdomen - 02/07/2015 - complex gall bladder collection, otherwise stable  Bedside para 8/24 but loculated and difficult to aspirate.  Patient is now intubated due to desaturation last night  S/P TPA via GB fossa drain yesterday and Thursday, still no drainage.   Allergies: Review of patient's allergies indicates no known allergies.  Medications: Prior to Admission medications   Medication Sig Start Date End Date Taking? Authorizing Provider  allopurinol (ZYLOPRIM) 300 MG tablet Take 300 mg by mouth daily.   Yes Historical Provider, MD  amLODipine (NORVASC) 10 MG tablet Take 10 mg by mouth every morning.    Yes Historical Provider, MD  apixaban (ELIQUIS) 5 MG TABS tablet Take 1 tablet (5 mg total) by mouth 2 (two) times daily. 10/01/14  Yes April Palumbo, MD  aspirin 81 MG tablet Take 81 mg by mouth daily.   Yes Historical Provider, MD  atorvastatin (LIPITOR) 80 MG tablet Take 80 mg by mouth at bedtime.    Yes Historical Provider, MD  Cholecalciferol (VITAMIN D PO) Take 2,000 Units by mouth daily.    Yes Historical Provider, MD  citalopram (CELEXA) 40 MG tablet Take 40 mg by mouth every morning.    Yes Historical Provider, MD  furosemide (LASIX) 40 MG tablet Take 40 mg by mouth every morning.    Yes Historical Provider, MD  glucose 4 GM chewable tablet Chew 1 tablet by mouth as needed for low blood sugar (only if BS IS BELOW 70).   Yes Historical Provider, MD  insulin glargine (LANTUS) 100 UNIT/ML injection Inject 40 Units into the skin at bedtime.   Yes Historical Provider, MD  magnesium oxide (MAG-OX) 400 MG tablet Take 400 mg by mouth 2  (two) times daily.    Yes Historical Provider, MD  metFORMIN (GLUCOPHAGE) 500 MG tablet Take 500 mg by mouth 2 (two) times daily with a meal.   Yes Historical Provider, MD  oxyCODONE-acetaminophen (PERCOCET/ROXICET) 5-325 MG per tablet Take 1-2 tablets by mouth every 4 (four) hours as needed for moderate pain. 01/05/15  Yes [name], MD  pantoprazole (PROTONIX) 40 MG tablet Take 40 mg by mouth daily.   Yes Historical Provider, MD  vitamin B-12 (CYANOCOBALAMIN) 500 MCG tablet Take 500 mcg by mouth daily.   Yes Historical Provider, MD  Liraglutide 18 MG/3ML SOPN Inject 1.2 mg into the skin daily. Pt states has been on hold since discharged 10-06-14 hospital visit    Historical Provider, MD  lisinopril (PRINIVIL,ZESTRIL) 40 MG tablet Take 40 mg by mouth 2 (two) times daily. On hold since 10-06-14    Historical Provider, MD  ondansetron (ZOFRAN) 8 MG tablet Take 8 mg by mouth 2 (two) times daily. Not taking-on hold form 10-06-14    Historical Provider, MD     Vital Signs: BP 122/63 mmHg  Pulse 59  Temp(Src) 97.3 F (36.3 C) (Axillary)  Resp 19  Ht  (1.676 m)  Wt 258 lb 9.6 oz (117.3 kg)  BMI 41.76 kg/m2  SpO2 100%  Physical Exam   Sedated, on Vent Currently on hemodialysis  Abdomen round, soft Drain in place, no erythema Flushes easily, but  no measurable output, only a small amount of blood tinged drainage in the bulb.  Imaging: Ir Catheter Tube Change  02/08/2015   CLINICAL DATA:  Cholecystectomy, postop hematoma, possibly infected. Poor output from drain catheter.  EXAM: ABSCESS  CATHETER EXCHANGE UNDER FLUOROSCOPY  FLUOROSCOPY TIME:  1.3 minutes, 71 mgy  TECHNIQUE: The procedure, risks (including but not limited to bleeding, infection, organ damage ), benefits, and alternatives were explained to the patient. Questions regarding the procedure were encouraged and answered. The patient understands and consents to the procedure. The abscess drain catheter and surrounding skin were  prepped with Betadine, draped in usual sterile fashion. Catheter was injected, partially opacifying the periphery of the known residual sub hepatic collection.  The catheter was cut and exchanged over a 0.035" angiographic wire for a new 14-French pigtail catheter, formed centrally within the collection system under fluoroscopy. Contrast injection confirms appropriate positioning. Catheter secured externally with 0 Prolene suture and StatLock.  Catheter placed to gravity bag. The patient tolerated the procedure well.  COMPLICATIONS: COMPLICATIONS none  IMPRESSION:  1. Technically successful exchange and up sizing of a gallbladder fossa abscess drain catheter under fluoroscopy   Electronically Signed   By: Corlis Leak M.D.   On: 02/08/2015 12:29   US Paracentesis  02/09/2015   INDICATION: ascites  EXAM: ULTRASOUND-GUIDED PARACENTESIS--Bedside/portable  COMPARISON:  None.  MEDICATIONS: 100 cc 1% lidocaine  COMPLICATIONS: None immediate  TECHNIQUE: Informed written consent was obtained from the patient after a discussion of the risks, benefits and alternatives to treatment. A timeout was performed prior to the initiation of the procedure.  Initial ultrasound scanning demonstrates a large amount of ascites within the right lower abdominal quadrant. The right lower abdomen was prepped and draped in the usual sterile fashion. 1% lidocaine with epinephrine was used for local anesthesia. Under direct ultrasound guidance, a 19 gauge, 7-cm, Yueh catheter was introduced. An ultrasound image was saved for documentation purposed. The paracentesis was performed. The catheter was removed and a dressing was applied. The patient tolerated the procedure well without immediate post procedural complication.  FINDINGS: A total of approximately 150 cc of bloody appearing fluid was removed. Samples were sent to the laboratory as requested by the clinical team.  IMPRESSION: Successful ultrasound-guided paracentesis yielding 150 cc of  peritoneal fluid.  Read by:  Robet Leu [facility]   Electronically Signed   By: Simonne Come M.D.   On: 02/09/2015 15:13   Dg Chest Port 1 View  02/12/2015   CLINICAL DATA:  Acute respiratory failure. History of hypertension, atrial fibrillation, diabetes and myocardial infarction.  EXAM: PORTABLE CHEST - 1 VIEW  COMPARISON:  02/11/2015 and 02/10/2015.  FINDINGS: 0512 hours. Endotracheal tube tip is unchanged at approximately 2.5 cm above the carina. A right arm PICC tip extends into the right atrium. Left IJ central venous catheter extends to the lower SVC level. Nasogastric tube projects below the diaphragm. There are persistent low volumes with overall slightly improved pulmonary aeration. No edema, confluent airspace opacity, pneumothorax or significant pleural effusion identified. The heart size and mediastinal contours are stable.  IMPRESSION: Interval slight improved aeration of the lungs. Stable support system.   Electronically Signed   By: Carey Bullocks M.D.   On: 02/12/2015 09:08   Dg Chest Port 1 View  02/11/2015   CLINICAL DATA:  Central line placement.  EXAM: PORTABLE CHEST - 1 VIEW  COMPARISON:  02/10/2015  FINDINGS: Interval placement of endotracheal tube with tip measuring 2.3 cm above the  carina. Enteric tube was placed with tip off the field of view but below the left hemidiaphragm. Left central venous catheter was placed with tip over the low SVC region. No pneumothorax. Shallow inspiration. Mild cardiac enlargement. Linear atelectasis in the right lung base. No consolidation in the lungs. No blunting of costophrenic angles. No pneumothorax. Degenerative changes in the spine and shoulders.  IMPRESSION: Appliances appear to be in satisfactory position. Shallow inspiration with atelectasis in the right lung base. Cardiac enlargement.   Electronically Signed   By: Burman Nieves M.D.   On: 02/11/2015 03:04   Dg Chest Port 1 View  02/10/2015   CLINICAL DATA:  Projectile vomiting and  difficulty breathing  EXAM: PORTABLE CHEST - 1 VIEW  COMPARISON:  02/08/2015  FINDINGS: Left jugular central line and right-sided PICC line are again identified and stable. The overall inspiratory effort is poor. The left lung is clear. Elevation of the right hemidiaphragm is noted. Mild vascular crowding is seen. Cardiac shadow is enlarged.  IMPRESSION: Poor inspiratory effort with vascular crowding. No focal confluent infiltrate is seen.   Electronically Signed   By: Alcide Clever M.D.   On: 02/10/2015 11:35   Dg Chest Port 1 View  02/08/2015   CLINICAL DATA:  Perihepatic hematoma post cholecystectomy  EXAM: PORTABLE CHEST - 1 VIEW  COMPARISON:  02/05/2015 and earlier studies  FINDINGS: Left IJ hemodialysis catheter placed to the low SVC. Right arm PICC line to the low SVC. Low lung volumes. No pneumothorax. Perihilar atelectasis or infiltrates right greater than left, slightly increased. No definite effusion. Heart size upper limits normal for technique.  Spondylitic changes near the thoracolumbar junction.  IMPRESSION: 1. Left IJ Dialysis catheter and right arm PICC placement to the low SVC without pneumothorax.   Electronically Signed   By: Corlis Leak M.D.   On: 02/08/2015 12:31    Labs:  CBC:  Recent Labs  02/09/15 0400 02/10/15 0452 02/11/15 0600  02/12/15 0502 02/12/15 0650 02/12/15 0830 02/12/15 0852  WBC 21.2* 20.1* 22.3*  --   --  21.6*  --   --   HGB 7.7* 7.9* 8.5*  < > 9.9* 8.0* 9.2* 10.9*  HCT 24.1* 24.5* 26.2*  < > 29.0* 26.2* 27.0* 32.0*  PLT 344 367 413*  --   --  250  --   --   < > = values in this interval not displayed.  COAGS:  Recent Labs  10/06/14 0445 01/19/15 1613 01/23/15 0754  02/09/15 0400 02/10/15 0452 02/11/15 0600 02/12/15 0650  INR 1.07 1.46 1.32  --   --   --   --   --   APTT 34 35 38*  < > 157* 163* 37 34  < > = values in this interval not displayed.  BMP:  Recent Labs  02/10/15 0452 02/11/15 0600 02/11/15 1620  02/12/15 0502  02/12/15 0650 02/12/15 0830 02/12/15 0852  NA 134* 131* 132*  < > 133* 131* 131* 134*  K 4.0 4.2 4.4  < > 3.8 4.2 3.7 3.8  CL 99* 100* 101  < > 94* 94* 92* 92*  CO2 25 23 25   --   --  30  --   --   GLUCOSE 152* 275* 355*  < > 335* 327* 345* 345*  BUN 30* 32* 36*  < > 34* 36* 33* 27*  CALCIUM 8.3* 8.0* 8.0*  --   --  8.8*  --   --   CREATININE 2.98* 2.56* 2.56*  < >  2.00* 2.18* 2.00* 1.60*  GFRNONAA 20* 24* 24*  --   --  29*  --   --   GFRAA 23* 28* 28*  --   --  34*  --   --   < > = values in this interval not displayed.  LIVER FUNCTION TESTS:  Recent Labs  02/06/15 0425 02/07/15 0352  02/08/15 0540  02/10/15 0452 02/11/15 0600 02/11/15 1620 02/12/15 0650  BILITOT 1.0 1.4*  --  1.0  --  1.3*  --   --   --   AST 20 20  --  58*  --  78*  --   --   --   ALT 13* 11*  --  19  --  37  --   --   --   ALKPHOS 87 99  --  131*  --  138*  --   --   --   PROT 6.5 6.5  --  6.6  --  7.3  --   --   --   ALBUMIN 2.2* 2.4*  < > 2.1*  < > 2.3* 2.2* 2.2* 1.9*  < > = values in this interval not displayed.  Assessment and Plan:  Lap chole with IOC - 01/04/2015  Readmitted 01/19/2015 - Post op course complicated by intaabdominal abscess   S/P RUQ perc drain to GB fossa on 8/4, but with essentially no drainage, upsized on 8/18.  Last CT of abdomen - 02/07/2015 - complex gall bladder collection, otherwise stable  Bedside para 8/24 but loculated and difficult to aspirate.  WBC 21  Renal function improving - on dialysis.  Will continue to follow  Care per Surgery and Critical Care  Signed: Gwynneth Macleod PA-C 02/12/2015, 9:16 AM   I spent a total of 15 Minutes at the the patient's bedside AND on the patient's hospital floor or unit, greater than 50% of which was counseling/coordinating care for f/u for GB fossa perc drain.

## 2015-02-12 NOTE — Progress Notes (Signed)
[facility] Progress Note   Subjective: no better, pressors dose increasing, unable to pull fluid and so I/O's are about equal  Filed Vitals:   02/12/15 1100 02/12/15 1200 02/12/15 1300 02/12/15 1356  BP: 103/56 82/35 94/37    Pulse: 78 77 84   Temp:  97.4 F (36.3 C)    TempSrc:  Axillary    Resp: Height:      Weight:      SpO2: 97% 98% 98% 99%   Exam: Pale, on vent, eyes open No jvd Chest bilat ant rhonchi RRR no MRG Abd obese, 2-3+ ascites, RUQ drain LE edema mostly resolved bilat Neuro sedated on vent  CXR 8/26 no acute changes UA renal 11-12 cm, no hydro, normal echo     Assessment: 1 AKI prolonged ATN - on CRRT, shock no betters on increasing pressor dosing 2 Anemia s/p prbc's 3 RUQ abscess on AB + drain, per ID/ surg/ prim 4 Obesity 5 DM2  6 Nutrition onTNA 7 Volume excess - significantly better  Plan - cont CRRT, keep even, citrate local AC.  Poor prognosis still.     Vinson Moselle MD  pager [phone]    cell [phone]  02/12/2015, 2:39 PM     Recent Labs Lab 02/11/15 0600 02/11/15 1620  02/12/15 0650  02/12/15 1029 02/12/15 1229 02/12/15 1235  NA 131* 132*  < > 131*  < > 135 135 134*  K 4.2 4.4  < > 4.2  < > 3.8 3.8 3.9  CL 100* 101  < > 94*  < > 90* 92* 89*  CO2 23 25  --  30  --   --   --   --   GLUCOSE 275* 355*  < > 327*  < > 369* 358* 369*  BUN 32* 36*  < > 36*  < > 29* 34* 29*  CREATININE 2.56* 2.56*  < > 2.18*  < > 1.60* 1.90* 1.50*  CALCIUM 8.0* 8.0*  --  8.8*  --   --   --   --   PHOS 2.5 2.6  --  3.1  3.1  --   --   --   --   < > = values in this interval not displayed.  Recent Labs Lab 02/07/15 0352  02/08/15 0540  02/10/15 0452 02/11/15 0600 02/11/15 1620 02/12/15 0650  AST 20  --  58*  --  78*  --   --   --   ALT 11*  --  19  --  37  --   --   --   ALKPHOS 99  --  131*  --  138*  --   --   --   BILITOT 1.4*  --  1.0  --  1.3*  --   --   --   PROT 6.5  --  6.6  --  7.3  --   --   --    ALBUMIN 2.4*  < > 2.1*  < > 2.3* 2.2* 2.2* 1.9*  < > = values in this interval not displayed.  Recent Labs Lab 02/06/15 0425 02/07/15 0352  02/10/15 0452 02/11/15 0600  02/12/15 0650  02/12/15 1029 02/12/15 1229 02/12/15 1235  WBC 14.4* 14.3*  < > 20.1* 22.3*  --  21.6*  --   --   --   --   NEUTROABS 12.3* 12.1*  --   --  20.1*  --   --   --   --   --   --  HGB 7.5* 7.6*  < > 7.9* 8.5*  < > 8.0*  < > 11.6* 9.5* 11.2*  HCT 23.0* 23.7*  < > 24.5* 26.2*  < > 26.2*  < > 34.0* 28.0* 33.0*  MCV 90.2 91.2  < > 89.4 91.9  --  90.7  --   --   --   --   PLT 372 390  < > 367 413*  --  250  --   --   --   --   < > = values in this interval not displayed. Marland Kitchen anidulafungin  100 mg Intravenous Q24H  . antiseptic oral rinse  7 mL Mouth Rinse QID  . chlorhexidine gluconate  15 mL Mouth Rinse BID  . darbepoetin (ARANESP) injection - NON-DIALYSIS  200 mcg Subcutaneous Q Wed-1800  . feeding supplement (NEPRO CARB STEADY)  1,000 mL Per Tube Q24H  . hydrocortisone sodium succinate  50 mg Intravenous Q6H  . insulin aspart  0-20 Units Subcutaneous 6 times per day  . insulin glargine  40 Units Subcutaneous QHS  . ipratropium  0.5 mg Nebulization Q6H  . levalbuterol  0.63 mg Nebulization Q6H  . meropenem (MERREM) IV  500 mg Intravenous Q6H  . nystatin   Topical TID  . pantoprazole (PROTONIX) IV  40 mg Intravenous Q24H  . potassium chloride  10 mEq Intravenous Q1 Hr x 2  . sodium chloride  10-40 mL Intracatheter Q12H   . Marland KitchenTPN (CLINIMIX-E) Adult 110 mL/hr at 02/11/15 1740  . Marland KitchenTPN (CLINIMIX-E) Adult    . sodium chloride 10 mL/hr at 01/31/15 0508  . calcium gluconate infusion for CRRT 20 g (02/11/15 1832)  . fentaNYL infusion INTRAVENOUS 150 mcg/hr (02/12/15 1046)  . phenylephrine (NEO-SYNEPHRINE) Adult infusion 110 mcg/min (02/12/15 1434)  . dialysis replacement fluid (prismasate) 300 mL/hr at 02/12/15 1336  . dialysate (PRISMASATE) 1,800 mL/hr at 02/12/15 1408  . sodium citrate 2 %/dextrose 2.5%  solution 3000 mL 410 mL/hr at 02/12/15 1413   acetaminophen, alteplase, fentaNYL, fentaNYL (SUBLIMAZE) injection, heparin, heparin, iohexol, ipratropium, levalbuterol, [DISCONTINUED] ondansetron **OR** ondansetron (ZOFRAN) IV, sodium chloride

## 2015-02-12 NOTE — Progress Notes (Signed)
PULMONARY / CRITICAL CARE MEDICINE   Name: Larry [name] MRN: [id number] DOB: Nov 17, 1945    ADMISSION DATE:  01/19/2015 CONSULTATION DATE:  8/4  REFERRING MD :  [name]  CHIEF COMPLAINT:  Septic shock   INITIAL PRESENTATION:  69 year old male w/ CAF on Elliquis who recently underwent lap chole on 7/14. Was discharged home w/drain. Drain removed about a week before presentation to ER. Admitted directly from [facility] on 8/3 w/ septic shock which was likely due to infected hematoma in the GB fossa. PCCM asked to see after new perc drain placed on 8/4, when he remained hypotensive.    STUDIES:  CT abdomen/Pelvis W/ 7/28 - Gas & fluid at cholecystectomy site w/o discrete abscess. Min ascites. Small right pleural eff. CT abdomen/pelvis w/o 8/5 - Gas between liver & stomach. No additional free air. Increased flow void & high density layering c/w increasing hemorrhage as well as increased right pleural eff. CT abdomen/pelvis 8/18 >> 9.8 cm hematoma with gas in the gallbladder fossa, indwelling pigtail drainage catheter, 12.6 cm hematoma along the posterior R liver, mod ascites, mild wall thickening involving the R colon CT abdomen/pelvis 8/22 >> stable perihepatic hematoma, stable gallbladder fossa collection despite adequate drainage cath placement, significant free fluid within the abdomen/pelvis slightly increased from prior exam  SIGNIFICANT EVENTS: Admitted 8/3 w/ abd pain and hypotension  8/04  hypotensive over-night. Went for [facility] drain of GB fossa. Found what looked like old infected hematoma. PCCM asked to see post-op for shock 8/05  off pressors. WOB worse.  8/06  WOB & wheezing slightly worse. Progressing abdominal pain 8/07  Hgb declining w/ increasing intraperitoneal hemorrhage 8/07  Transfused 2u PRBCs 8/09  work of breathing still significant w/ marked upper airway component  8/10  breathing better. 1.6 liters negative. Slight bump in scr. Vanc stopped. Getting OOB for  first time 8/11  eating regular diet. Up in chair. Feeling better.  Respiratory status improved.  PCCM s/o 8/14  Renal consulted for rising sr cr (to 3.60), baseline sr cr 1.1-1.5 (prior IV contrast 7/28).   8/14  Concern for yeast / bacterial UTI  8/15  ID consulted > abx narrowed to zosyn only, fluconazole for yeast  8/16  UOP increased on IV lasix 8/18  NPO, sr cr improving, CT abd as above  8/18  GB drain upsized per IR to 16 Fr 8/19  Abd pain, eating well.  Diarrhea after ensure.  To PO lasix.  Discussed gastrostomy tube with IR 8/22  Sr Cr rising, intermittent vomiting.  CT abd as above.  Increased confusion 8/23  HD cath inserted, CRRT initiated.  Gastrostomy tube held off due to CRRT & Hgb drop 8/24  WBC elevated, cultures repeated.   8/24  Paracentesis >> 150 ml "bloody bile" fluid removed, loculated and difficult to aspirate 8/25  Palliative care consulted, PCCM consulted.  Vomiting with NGT insertion.  8/26  Intubated early am(0200) for desaturations, significant agitation, delirium   SUBJECTIVE:  cvvhd on going   VITAL SIGNS: Temp:  [96.2 F (35.7 C)-97.8 F (36.6 C)] 97.3 F (36.3 C) (08/27 0800) Pulse Rate:  [50-84] 59 (08/27 0716) Resp:  [14-40] 19 (08/27 0716) BP: (50-166)/(14-110) 122/63 mmHg (08/27 0716) SpO2:  [93 %-100 %] 100 % (08/27 0755) FiO2 (%):  [40 %-50 %] 40 % (08/27 0755) Weight:  [117.3 kg (258 lb 9.6 oz)] 117.3 kg (258 lb 9.6 oz) (08/27 0500)  HEMODYNAMICS: CVP:  [0 mmHg-17 mmHg] 14 mmHg  VENTILATOR SETTINGS: Vent Mode:  [-] PRVC FiO2 (%):  [40 %-50 %] 40 % Set Rate:  [12 bmp] 12 bmp Vt Set:  [510 mL] 510 mL PEEP:  [5 cmH20] 5 cmH20 Plateau Pressure:  [16 cmH20-26 cmH20] 24 cmH20   INTAKE / OUTPUT:  Intake/Output Summary (Last 24 hours) at 02/12/15 1478 Last data filed at 02/12/15 0800  Gross per 24 hour  Intake 5528.27 ml  Output   4757 ml  Net 771.27 ml    PHYSICAL EXAMINATION: General:  Acutely ill adult male on  vent Neuro:   Sedated rass -1, int fc HEENT:  HD cath cleaned Cardiovascular:  s1s2 rrr, no m/r/g Lungs:  Wheezing exp uppers mild Abdomen:  RUQ perc drain in place with scant bloody drainage, min to none. Hypoactive BS. distended Musculoskeletal:  No joint effusions or deformities. Skin:  gen edema  LABS:  CBC  Recent Labs Lab 02/10/15 0452 02/11/15 0600  02/12/15 0042 02/12/15 0127 02/12/15 0650  WBC 20.1* 22.3*  --   --   --  21.6*  HGB 7.9* 8.5*  < > 9.9* 10.5* 8.0*  HCT 24.5* 26.2*  < > 29.0* 31.0* 26.2*  PLT 367 413*  --   --   --  250  < > = values in this interval not displayed. Coag's  Recent Labs Lab 02/10/15 0452 02/11/15 0600 02/12/15 0650  APTT 163* 37 34   BMET  Recent Labs Lab 02/11/15 0600 02/11/15 1620  02/12/15 0042 02/12/15 0127 02/12/15 0650  NA 131* 132*  < > 130* 133* 131*  K 4.2 4.4  < > 3.9 3.9 4.2  CL 100* 101  < > 96* 91* 94*  CO2 23 25  --   --   --  30  BUN 32* 36*  < > 32* 29* 36*  CREATININE 2.56* 2.56*  < > 2.10* 1.70* 2.18*  GLUCOSE 275* 355*  < > 394* 387* 327*  < > = values in this interval not displayed. Electrolytes  Recent Labs Lab 02/10/15 0452 02/11/15 0600 02/11/15 1620 02/12/15 0650  CALCIUM 8.3* 8.0* 8.0* 8.8*  MG 2.2 2.2  --  2.2  PHOS 2.2* 2.5 2.6 3.1  3.1   Sepsis Markers No results for input(s): LATICACIDVEN, PROCALCITON, O2SATVEN in the last 168 hours.   ABG  Recent Labs Lab 02/10/15 1158 02/11/15 0002 02/11/15 0250  PHART 7.411 7.352 7.319*  PCO2ART 39.6 37.3 42.0  PO2ART 68.7* 57.5* 243*   Liver Enzymes  Recent Labs Lab 02/07/15 0352  02/08/15 0540  02/10/15 0452 02/11/15 0600 02/11/15 1620 02/12/15 0650  AST 20  --  58*  --  78*  --   --   --   ALT 11*  --  19  --  37  --   --   --   ALKPHOS 99  --  131*  --  138*  --   --   --   BILITOT 1.4*  --  1.0  --  1.3*  --   --   --   ALBUMIN 2.4*  < > 2.1*  < > 2.3* 2.2* 2.2* 1.9*  < > = values in this interval not displayed.   Cardiac  Enzymes No results for input(s): TROPONINI, PROBNP in the last 168 hours.   Glucose  Recent Labs Lab 02/10/15 2334 02/11/15 0356 02/11/15 0759 02/11/15 1245 02/11/15 1621 02/12/15 0822  GLUCAP 180* 257* 251* 310* 334* 307*    Imaging No results found.  ASSESSMENT / PLAN:  PULMONARY A: Ventilator Dependent Respiratory Failure - in the setting of significant agitation, renal fx, delirium  Chronically elevated right HD Acute Hypoxic Respiratory Failure Right pleural effusion - progessing on Abd CT 8/6 ARF, hypoxia P:   Xopenex neb q6hr PRN Min setting vent, consider abg Wean today PS 10 needed, no extubation planned with neurostatus  CARDIOVASCULAR CVL Right IJ 8/4 >> 8/22 RUE PICC 8/22 >>  A:  Severe sepsis/septic shock - resolved Hypotension - 8/26, started on neo post intubation, ? Sedation related Hx PAF - CHADVASC score 4; currently NSR  Prior NSTEMI  Pressor needed 8/27, prece dex related>? P:  Neo for MAP > 60 Dc precedex cvp variable? Accuracy? Tele Get cortisol , last done 8/4 Then empiric roids  RENAL A:   ARF - in setting of septic shock, prior hypotension/ATN  P:   Cvvhd, favor neg balance Hope can pull fluid and dc pressors Chem in am   GASTROINTESTINAL A:   Infected hematoma s/p cholecystectomy - Now s/p CT guided Perc drain with subsequent upsize to 16 Fr GERD - worse 8/5 w/ associated UAW wheeze Intra-abdominal hemorrhage / hematoma - Hgb stable now Nausea / Vomiting - concern for ileus (8/25) Protein Calorie Malnutrition   P:   TNA per pharmacy  Trickle feeding initiated per CCS 8/25, on hold for now asp? Protonix QD Reflux precautions NPO Reglan Q8 x4 doses Place NGT to LIS  Will discuss with surgery to place post pyloric tube and feed gut?  HEMATOLOGIC A:   Anemia - in setting of intra-abdominal hemorrhage (8/4).  S/p 2 u PRBC on 8/4 & 2u 8/7   P:  Trend CBC  SCDs Transfuse for Hbg < 7, not indicated  today  INFECTIOUS A:   Severe sepsis/septic shock - presume source is infected hematoma from recent Cholecystectomy. Shock resolved.  S/p Perc GB fossa site drain 8/4 - minimal serous output High Risk for Fungemia P:   Trending WBC / Fever curve  ID following, appreciate input   Abscess 8/4>>>neg BCX2 8/4>>>neg  Peritoneal fluid GS 8/24 >> abundant WBC, no organisms seen Peritoneal fluid culture 8/24 >>  BCx2 8/25 >>   Meropenem 8/24 >>  Fluconazole 8/15 >> 8/23 anigulofungin 8/26>>>  Maintain current regimen  ENDOCRINE A: DM - CBG's well controlled R/o AI P:   SSI Q4 Lantus 35 units QHS -increase 40 May add TF coverage Send cortisol  NEUROLOGIC A:  Abdominal Pain/Peritonitis -  Delirium  Critical Illness Deconditioning  P:   RASS goal:  -1 Supportive care Delirium prevention measures: minimize sedating medications, promote sleep / wake cycle  Fentanyl IV prn for pain , to drip Haldol for agitation, follow Qtc - dc Precedex gtt for sedation - dc in shock Passive ROM  WUA  FAMILY  - Updates:  Wife called and updated via phone per NP 8/26.   - Inter-disciplinary family meet or Palliative Care meeting due by:  Ongoing, family wants to continue full aggressive measures.  Hopeful that patient will be able to recover.    Ccm time 30 min   Mcarthur Rossetti. Tyson Alias, MD, FACP Pgr: [phone] [facility]

## 2015-02-13 ENCOUNTER — Inpatient Hospital Stay (HOSPITAL_COMMUNITY): Payer: Medicare Other

## 2015-02-13 LAB — BLOOD GAS, ARTERIAL
Acid-Base Excess: 7.4 mmol/L — ABNORMAL HIGH (ref 0.0–2.0)
BICARBONATE: 32.9 meq/L — AB (ref 20.0–24.0)
Drawn by: 295031
FIO2: 0.4
O2 Saturation: 93.3 %
PATIENT TEMPERATURE: 98.6
PCO2 ART: 56.4 mmHg — AB (ref 35.0–45.0)
PEEP: 5 cmH2O
PO2 ART: 72.2 mmHg — AB (ref 80.0–100.0)
RATE: 12 resp/min
TCO2: 31.6 mmol/L (ref 0–100)
VT: 510 mL
pH, Arterial: 7.384 (ref 7.350–7.450)

## 2015-02-13 LAB — RENAL FUNCTION PANEL
ALBUMIN: 2 g/dL — AB (ref 3.5–5.0)
ALBUMIN: 2 g/dL — AB (ref 3.5–5.0)
ANION GAP: 12 (ref 5–15)
ANION GAP: 10 (ref 5–15)
BUN: 34 mg/dL — ABNORMAL HIGH (ref 6–20)
BUN: 41 mg/dL — ABNORMAL HIGH (ref 6–20)
CALCIUM: 9.5 mg/dL (ref 8.9–10.3)
CALCIUM: 9.4 mg/dL (ref 8.9–10.3)
CO2: 36 mmol/L — ABNORMAL HIGH (ref 22–32)
CO2: 35 mmol/L — ABNORMAL HIGH (ref 22–32)
CREATININE: 2.23 mg/dL — AB (ref 0.61–1.24)
CREATININE: 2.32 mg/dL — AB (ref 0.61–1.24)
Chloride: 86 mmol/L — ABNORMAL LOW (ref 101–111)
Chloride: 90 mmol/L — ABNORMAL LOW (ref 101–111)
GFR calc non Af Amer: 29 mL/min — ABNORMAL LOW (ref >60)
GFR, EST AFRICAN AMERICAN: 33 mL/min — AB (ref >60)
GFR, EST AFRICAN AMERICAN: 32 mL/min — AB (ref >60)
GFR, EST NON AFRICAN AMERICAN: 27 mL/min — AB (ref >60)
Glucose, Bld: 445 mg/dL — ABNORMAL HIGH (ref 65–99)
Glucose, Bld: 339 mg/dL — ABNORMAL HIGH (ref 65–99)
PHOSPHORUS: 3.1 mg/dL (ref 2.5–4.6)
PHOSPHORUS: 3.1 mg/dL (ref 2.5–4.6)
Potassium: 4.1 mmol/L (ref 3.5–5.1)
Potassium: 4 mmol/L (ref 3.5–5.1)
SODIUM: 134 mmol/L — AB (ref 135–145)
SODIUM: 135 mmol/L (ref 135–145)

## 2015-02-13 LAB — POCT I-STAT, CHEM 8
BUN: 28 mg/dL — ABNORMAL HIGH (ref 6–20)
BUN: 28 mg/dL — ABNORMAL HIGH (ref 6–20)
BUN: 36 mg/dL — ABNORMAL HIGH (ref 6–20)
BUN: 27 mg/dL — ABNORMAL HIGH (ref 6–20)
BUN: 27 mg/dL — ABNORMAL HIGH (ref 6–20)
BUN: 33 mg/dL — ABNORMAL HIGH (ref 6–20)
BUN: 44 mg/dL — AB (ref 6–20)
BUN: 31 mg/dL — ABNORMAL HIGH (ref 6–20)
BUN: 37 mg/dL — ABNORMAL HIGH (ref 6–20)
BUN: 30 mg/dL — ABNORMAL HIGH (ref 6–20)
BUN: 39 mg/dL — AB (ref 6–20)
BUN: 29 mg/dL — ABNORMAL HIGH (ref 6–20)
BUN: 36 mg/dL — ABNORMAL HIGH (ref 6–20)
BUN: 29 mg/dL — ABNORMAL HIGH (ref 6–20)
BUN: 41 mg/dL — AB (ref 6–20)
BUN: 30 mg/dL — ABNORMAL HIGH (ref 6–20)
BUN: 35 mg/dL — ABNORMAL HIGH (ref 6–20)
CALCIUM ION: 1.12 mmol/L — AB (ref 1.13–1.30)
CALCIUM ION: 0.42 mmol/L — AB (ref 1.13–1.30)
CALCIUM ION: 0.4 mmol/L — AB (ref 1.13–1.30)
CALCIUM ION: 1.22 mmol/L (ref 1.13–1.30)
CALCIUM ION: 1.32 mmol/L — AB (ref 1.13–1.30)
CALCIUM ION: 0.55 mmol/L — AB (ref 1.13–1.30)
CALCIUM ION: 1.24 mmol/L (ref 1.13–1.30)
CALCIUM ION: 0.43 mmol/L — AB (ref 1.13–1.30)
CALCIUM ION: 0.41 mmol/L — AB (ref 1.13–1.30)
CALCIUM ION: 1.09 mmol/L — AB (ref 1.13–1.30)
CALCIUM ION: 1.07 mmol/L — AB (ref 1.13–1.30)
CHLORIDE: 83 mmol/L — AB (ref 101–111)
CHLORIDE: 86 mmol/L — AB (ref 101–111)
CHLORIDE: 88 mmol/L — AB (ref 101–111)
CHLORIDE: 89 mmol/L — AB (ref 101–111)
CHLORIDE: 91 mmol/L — AB (ref 101–111)
CREATININE: 1.5 mg/dL — AB (ref 0.61–1.24)
CREATININE: 1.6 mg/dL — AB (ref 0.61–1.24)
CREATININE: 1.7 mg/dL — AB (ref 0.61–1.24)
CREATININE: 1.7 mg/dL — AB (ref 0.61–1.24)
Calcium, Ion: 0.42 mmol/L — CL (ref 1.13–1.30)
Calcium, Ion: 0.42 mmol/L — CL (ref 1.13–1.30)
Calcium, Ion: 0.78 mmol/L — ABNORMAL LOW (ref 1.13–1.30)
Calcium, Ion: 0.67 mmol/L — ABNORMAL LOW (ref 1.13–1.30)
Calcium, Ion: 1.11 mmol/L — ABNORMAL LOW (ref 1.13–1.30)
Calcium, Ion: 0.48 mmol/L — CL (ref 1.13–1.30)
Chloride: 86 mmol/L — ABNORMAL LOW (ref 101–111)
Chloride: 88 mmol/L — ABNORMAL LOW (ref 101–111)
Chloride: 84 mmol/L — ABNORMAL LOW (ref 101–111)
Chloride: 81 mmol/L — ABNORMAL LOW (ref 101–111)
Chloride: 88 mmol/L — ABNORMAL LOW (ref 101–111)
Chloride: 87 mmol/L — ABNORMAL LOW (ref 101–111)
Chloride: 89 mmol/L — ABNORMAL LOW (ref 101–111)
Chloride: 89 mmol/L — ABNORMAL LOW (ref 101–111)
Chloride: 90 mmol/L — ABNORMAL LOW (ref 101–111)
Chloride: 89 mmol/L — ABNORMAL LOW (ref 101–111)
Chloride: 93 mmol/L — ABNORMAL LOW (ref 101–111)
Chloride: 88 mmol/L — ABNORMAL LOW (ref 101–111)
Creatinine, Ser: 1.8 mg/dL — ABNORMAL HIGH (ref 0.61–1.24)
Creatinine, Ser: 1.5 mg/dL — ABNORMAL HIGH (ref 0.61–1.24)
Creatinine, Ser: 1.5 mg/dL — ABNORMAL HIGH (ref 0.61–1.24)
Creatinine, Ser: 1.7 mg/dL — ABNORMAL HIGH (ref 0.61–1.24)
Creatinine, Ser: 2 mg/dL — ABNORMAL HIGH (ref 0.61–1.24)
Creatinine, Ser: 2.1 mg/dL — ABNORMAL HIGH (ref 0.61–1.24)
Creatinine, Ser: 1.7 mg/dL — ABNORMAL HIGH (ref 0.61–1.24)
Creatinine, Ser: 2.2 mg/dL — ABNORMAL HIGH (ref 0.61–1.24)
Creatinine, Ser: 1.5 mg/dL — ABNORMAL HIGH (ref 0.61–1.24)
Creatinine, Ser: 2 mg/dL — ABNORMAL HIGH (ref 0.61–1.24)
Creatinine, Ser: 1.7 mg/dL — ABNORMAL HIGH (ref 0.61–1.24)
Creatinine, Ser: 1.8 mg/dL — ABNORMAL HIGH (ref 0.61–1.24)
Creatinine, Ser: 2.1 mg/dL — ABNORMAL HIGH (ref 0.61–1.24)
GLUCOSE: 399 mg/dL — AB (ref 65–99)
GLUCOSE: 432 mg/dL — AB (ref 65–99)
GLUCOSE: 365 mg/dL — AB (ref 65–99)
GLUCOSE: 230 mg/dL — AB (ref 65–99)
GLUCOSE: 164 mg/dL — AB (ref 65–99)
Glucose, Bld: 421 mg/dL — ABNORMAL HIGH (ref 65–99)
Glucose, Bld: 428 mg/dL — ABNORMAL HIGH (ref 65–99)
Glucose, Bld: >700 mg/dL (ref 65–99)
Glucose, Bld: 427 mg/dL — ABNORMAL HIGH (ref 65–99)
Glucose, Bld: 449 mg/dL — ABNORMAL HIGH (ref 65–99)
Glucose, Bld: 412 mg/dL — ABNORMAL HIGH (ref 65–99)
Glucose, Bld: >700 mg/dL (ref 65–99)
Glucose, Bld: 365 mg/dL — ABNORMAL HIGH (ref 65–99)
Glucose, Bld: 294 mg/dL — ABNORMAL HIGH (ref 65–99)
Glucose, Bld: 312 mg/dL — ABNORMAL HIGH (ref 65–99)
Glucose, Bld: 284 mg/dL — ABNORMAL HIGH (ref 65–99)
Glucose, Bld: 239 mg/dL — ABNORMAL HIGH (ref 65–99)
HCT: 33 % — ABNORMAL LOW (ref 39.0–52.0)
HCT: 31 % — ABNORMAL LOW (ref 39.0–52.0)
HCT: 26 % — ABNORMAL LOW (ref 39.0–52.0)
HCT: 31 % — ABNORMAL LOW (ref 39.0–52.0)
HCT: 27 % — ABNORMAL LOW (ref 39.0–52.0)
HCT: 33 % — ABNORMAL LOW (ref 39.0–52.0)
HCT: 30 % — ABNORMAL LOW (ref 39.0–52.0)
HCT: 31 % — ABNORMAL LOW (ref 39.0–52.0)
HCT: 26 % — ABNORMAL LOW (ref 39.0–52.0)
HEMATOCRIT: 34 % — AB (ref 39.0–52.0)
HEMATOCRIT: 25 % — AB (ref 39.0–52.0)
HEMATOCRIT: 32 % — AB (ref 39.0–52.0)
HEMATOCRIT: 33 % — AB (ref 39.0–52.0)
HEMATOCRIT: 31 % — AB (ref 39.0–52.0)
HEMATOCRIT: 28 % — AB (ref 39.0–52.0)
HEMATOCRIT: 33 % — AB (ref 39.0–52.0)
HEMATOCRIT: 64 % — AB (ref 39.0–52.0)
HEMOGLOBIN: 11.6 g/dL — AB (ref 13.0–17.0)
HEMOGLOBIN: 11.2 g/dL — AB (ref 13.0–17.0)
HEMOGLOBIN: 11.2 g/dL — AB (ref 13.0–17.0)
HEMOGLOBIN: 10.5 g/dL — AB (ref 13.0–17.0)
HEMOGLOBIN: 10.5 g/dL — AB (ref 13.0–17.0)
HEMOGLOBIN: 8.8 g/dL — AB (ref 13.0–17.0)
HEMOGLOBIN: 11.2 g/dL — AB (ref 13.0–17.0)
HEMOGLOBIN: 9.5 g/dL — AB (ref 13.0–17.0)
HEMOGLOBIN: 10.2 g/dL — AB (ref 13.0–17.0)
HEMOGLOBIN: 21.8 g/dL — AB (ref 13.0–17.0)
HEMOGLOBIN: 10.5 g/dL — AB (ref 13.0–17.0)
Hemoglobin: 8.5 g/dL — ABNORMAL LOW (ref 13.0–17.0)
Hemoglobin: 10.9 g/dL — ABNORMAL LOW (ref 13.0–17.0)
Hemoglobin: 10.5 g/dL — ABNORMAL LOW (ref 13.0–17.0)
Hemoglobin: 9.2 g/dL — ABNORMAL LOW (ref 13.0–17.0)
Hemoglobin: 11.2 g/dL — ABNORMAL LOW (ref 13.0–17.0)
Hemoglobin: 8.8 g/dL — ABNORMAL LOW (ref 13.0–17.0)
POTASSIUM: 3.7 mmol/L (ref 3.5–5.1)
POTASSIUM: 3.6 mmol/L (ref 3.5–5.1)
POTASSIUM: 4 mmol/L (ref 3.5–5.1)
POTASSIUM: 3.9 mmol/L (ref 3.5–5.1)
Potassium: 5.7 mmol/L — ABNORMAL HIGH (ref 3.5–5.1)
Potassium: 3.6 mmol/L (ref 3.5–5.1)
Potassium: 3.7 mmol/L (ref 3.5–5.1)
Potassium: 4 mmol/L (ref 3.5–5.1)
Potassium: 7.2 mmol/L (ref 3.5–5.1)
Potassium: 3.8 mmol/L (ref 3.5–5.1)
Potassium: 3.8 mmol/L (ref 3.5–5.1)
Potassium: 3.9 mmol/L (ref 3.5–5.1)
Potassium: 4.1 mmol/L (ref 3.5–5.1)
Potassium: 3.8 mmol/L (ref 3.5–5.1)
Potassium: 3.9 mmol/L (ref 3.5–5.1)
Potassium: 3.7 mmol/L (ref 3.5–5.1)
Potassium: 3.9 mmol/L (ref 3.5–5.1)
SODIUM: 134 mmol/L — AB (ref 135–145)
SODIUM: 120 mmol/L — AB (ref 135–145)
SODIUM: 133 mmol/L — AB (ref 135–145)
SODIUM: 134 mmol/L — AB (ref 135–145)
SODIUM: 135 mmol/L (ref 135–145)
SODIUM: 115 mmol/L — AB (ref 135–145)
SODIUM: 133 mmol/L — AB (ref 135–145)
SODIUM: 131 mmol/L — AB (ref 135–145)
SODIUM: 135 mmol/L (ref 135–145)
SODIUM: 131 mmol/L — AB (ref 135–145)
SODIUM: 136 mmol/L (ref 135–145)
SODIUM: 137 mmol/L (ref 135–145)
SODIUM: 136 mmol/L (ref 135–145)
Sodium: 131 mmol/L — ABNORMAL LOW (ref 135–145)
Sodium: 128 mmol/L — ABNORMAL LOW (ref 135–145)
Sodium: 137 mmol/L (ref 135–145)
Sodium: 133 mmol/L — ABNORMAL LOW (ref 135–145)
TCO2: 26 mmol/L (ref 0–100)
TCO2: 24 mmol/L (ref 0–100)
TCO2: 27 mmol/L (ref 0–100)
TCO2: 28 mmol/L (ref 0–100)
TCO2: 29 mmol/L (ref 0–100)
TCO2: 26 mmol/L (ref 0–100)
TCO2: 30 mmol/L (ref 0–100)
TCO2: 30 mmol/L (ref 0–100)
TCO2: 31 mmol/L (ref 0–100)
TCO2: 28 mmol/L (ref 0–100)
TCO2: 30 mmol/L (ref 0–100)
TCO2: 27 mmol/L (ref 0–100)
TCO2: 32 mmol/L (ref 0–100)
TCO2: 29 mmol/L (ref 0–100)
TCO2: 28 mmol/L (ref 0–100)
TCO2: 29 mmol/L (ref 0–100)
TCO2: 32 mmol/L (ref 0–100)

## 2015-02-13 LAB — GLUCOSE, CAPILLARY
GLUCOSE-CAPILLARY: 196 mg/dL — AB (ref 65–99)
GLUCOSE-CAPILLARY: 232 mg/dL — AB (ref 65–99)
GLUCOSE-CAPILLARY: 242 mg/dL — AB (ref 65–99)
GLUCOSE-CAPILLARY: 266 mg/dL — AB (ref 65–99)
GLUCOSE-CAPILLARY: 316 mg/dL — AB (ref 65–99)
GLUCOSE-CAPILLARY: 355 mg/dL — AB (ref 65–99)
GLUCOSE-CAPILLARY: 389 mg/dL — AB (ref 65–99)
GLUCOSE-CAPILLARY: 391 mg/dL — AB (ref 65–99)
GLUCOSE-CAPILLARY: 397 mg/dL — AB (ref 65–99)
GLUCOSE-CAPILLARY: 427 mg/dL — AB (ref 65–99)
Glucose-Capillary: 147 mg/dL — ABNORMAL HIGH (ref 65–99)
Glucose-Capillary: 287 mg/dL — ABNORMAL HIGH (ref 65–99)
Glucose-Capillary: 300 mg/dL — ABNORMAL HIGH (ref 65–99)
Glucose-Capillary: 386 mg/dL — ABNORMAL HIGH (ref 65–99)
Glucose-Capillary: 393 mg/dL — ABNORMAL HIGH (ref 65–99)
Glucose-Capillary: >600 mg/dL (ref 65–99)
Glucose-Capillary: >600 mg/dL (ref 65–99)
Glucose-Capillary: >600 mg/dL (ref 65–99)

## 2015-02-13 LAB — COMPREHENSIVE METABOLIC PANEL
ALBUMIN: 1.9 g/dL — AB (ref 3.5–5.0)
ALT: 27 U/L (ref 17–63)
ANION GAP: 11 (ref 5–15)
AST: 36 U/L (ref 15–41)
Alkaline Phosphatase: 107 U/L (ref 38–126)
BILIRUBIN TOTAL: 1.6 mg/dL — AB (ref 0.3–1.2)
BUN: 33 mg/dL — ABNORMAL HIGH (ref 6–20)
CALCIUM: 9.6 mg/dL (ref 8.9–10.3)
CO2: 36 mmol/L — AB (ref 22–32)
Chloride: 87 mmol/L — ABNORMAL LOW (ref 101–111)
Creatinine, Ser: 2.14 mg/dL — ABNORMAL HIGH (ref 0.61–1.24)
GFR calc non Af Amer: 30 mL/min — ABNORMAL LOW (ref >60)
GFR, EST AFRICAN AMERICAN: 35 mL/min — AB (ref >60)
GLUCOSE: 450 mg/dL — AB (ref 65–99)
POTASSIUM: 4.1 mmol/L (ref 3.5–5.1)
SODIUM: 134 mmol/L — AB (ref 135–145)
TOTAL PROTEIN: 6.4 g/dL — AB (ref 6.5–8.1)

## 2015-02-13 LAB — CBC WITH DIFFERENTIAL/PLATELET
BASOS ABS: 0.5 10*3/uL — AB (ref 0.0–0.1)
Basophils Relative: 2 % — ABNORMAL HIGH (ref 0–1)
EOS ABS: 0 10*3/uL (ref 0.0–0.7)
Eosinophils Relative: 0 % (ref 0–5)
HCT: 27.6 % — ABNORMAL LOW (ref 39.0–52.0)
HEMOGLOBIN: 8.7 g/dL — AB (ref 13.0–17.0)
LYMPHS PCT: 4 % — AB (ref 12–46)
Lymphs Abs: 1 10*3/uL (ref 0.7–4.0)
MCH: 29.2 pg (ref 26.0–34.0)
MCHC: 31.5 g/dL (ref 30.0–36.0)
MCV: 92.6 fL (ref 78.0–100.0)
MONOS PCT: 15 % — AB (ref 3–12)
Monocytes Absolute: 3.6 10*3/uL — ABNORMAL HIGH (ref 0.1–1.0)
NEUTROS ABS: 18.7 10*3/uL — AB (ref 1.7–7.7)
NEUTROS PCT: 79 % — AB (ref 43–77)
PLATELETS: 160 10*3/uL (ref 150–400)
RBC: 2.98 MIL/uL — ABNORMAL LOW (ref 4.22–5.81)
RDW: 20 % — ABNORMAL HIGH (ref 11.5–15.5)
WBC: 23.8 10*3/uL — ABNORMAL HIGH (ref 4.0–10.5)

## 2015-02-13 LAB — BASIC METABOLIC PANEL
Anion gap: 8 (ref 5–15)
BUN: 40 mg/dL — ABNORMAL HIGH (ref 6–20)
CO2: 37 mmol/L — ABNORMAL HIGH (ref 22–32)
Calcium: 9.1 mg/dL (ref 8.9–10.3)
Chloride: 89 mmol/L — ABNORMAL LOW (ref 101–111)
Creatinine, Ser: 2.5 mg/dL — ABNORMAL HIGH (ref 0.61–1.24)
GFR calc Af Amer: 29 mL/min — ABNORMAL LOW (ref >60)
GFR calc non Af Amer: 25 mL/min — ABNORMAL LOW (ref >60)
Glucose, Bld: 407 mg/dL — ABNORMAL HIGH (ref 65–99)
Potassium: 4.4 mmol/L (ref 3.5–5.1)
Sodium: 134 mmol/L — ABNORMAL LOW (ref 135–145)

## 2015-02-13 LAB — MAGNESIUM: Magnesium: 2 mg/dL (ref 1.7–2.4)

## 2015-02-13 LAB — PHOSPHORUS: PHOSPHORUS: 3.1 mg/dL (ref 2.5–4.6)

## 2015-02-13 LAB — APTT: aPTT: 30 seconds (ref 24–37)

## 2015-02-13 MED ORDER — SODIUM CHLORIDE 0.9 % IV SOLN
0.0000 ug/min | INTRAVENOUS | Status: DC
  Filled 2015-02-13: qty 4

## 2015-02-13 MED ORDER — SODIUM CHLORIDE 0.9 % IV SOLN
INTRAVENOUS | Status: DC
  Administered 2015-02-13: 5.4 [IU]/h via INTRAVENOUS
  Administered 2015-02-14: 4.7 [IU]/h via INTRAVENOUS
  Administered 2015-02-15: 4.5 [IU]/h via INTRAVENOUS
  Filled 2015-02-13 (×3): qty 2.5

## 2015-02-13 MED ORDER — ALTEPLASE 2 MG IJ SOLR
2.0000 mg | Freq: Once | INTRAMUSCULAR | Status: AC | PRN
  Administered 2015-02-13: 2 mg
  Filled 2015-02-13: qty 2

## 2015-02-13 MED ORDER — ALBUTEROL SULFATE (2.5 MG/3ML) 0.083% IN NEBU
10.0000 mg | INHALATION_SOLUTION | Freq: Once | RESPIRATORY_TRACT | Status: AC
  Administered 2015-02-13: 10 mg via RESPIRATORY_TRACT

## 2015-02-13 MED ORDER — VASOPRESSIN 20 UNIT/ML IV SOLN
0.0300 [IU]/min | INTRAVENOUS | Status: DC
  Administered 2015-02-13 – 2015-02-15 (×3): 0.03 [IU]/min via INTRAVENOUS
  Filled 2015-02-13 (×3): qty 2

## 2015-02-13 MED ORDER — INSULIN ASPART 100 UNIT/ML ~~LOC~~ SOLN
12.0000 [IU] | Freq: Once | SUBCUTANEOUS | Status: AC
  Administered 2015-02-13: 12 [IU] via INTRAVENOUS

## 2015-02-13 MED ORDER — VANCOMYCIN HCL 10 G IV SOLR
1750.0000 mg | Freq: Once | INTRAVENOUS | Status: AC
  Administered 2015-02-13: 1750 mg via INTRAVENOUS
  Filled 2015-02-13: qty 1750

## 2015-02-13 MED ORDER — ALBUTEROL SULFATE (2.5 MG/3ML) 0.083% IN NEBU
INHALATION_SOLUTION | RESPIRATORY_TRACT | Status: AC
  Filled 2015-02-13: qty 12

## 2015-02-13 MED ORDER — CALCIUM CHLORIDE 10 % IV SOLN
1.0000 g | Freq: Once | INTRAVENOUS | Status: AC
  Administered 2015-02-13: 1 g via INTRAVENOUS
  Filled 2015-02-13: qty 10

## 2015-02-13 MED ORDER — VANCOMYCIN HCL IN DEXTROSE 1-5 GM/200ML-% IV SOLN
1000.0000 mg | INTRAVENOUS | Status: DC
  Administered 2015-02-14 – 2015-02-15 (×2): 1000 mg via INTRAVENOUS
  Filled 2015-02-13: qty 200

## 2015-02-13 MED ORDER — INSULIN GLARGINE 100 UNIT/ML ~~LOC~~ SOLN
60.0000 [IU] | Freq: Every day | SUBCUTANEOUS | Status: DC
  Administered 2015-02-13: 60 [IU] via SUBCUTANEOUS
  Filled 2015-02-13: qty 0.6

## 2015-02-13 MED ORDER — TRACE MINERALS CR-CU-MN-SE-ZN 10-1000-500-60 MCG/ML IV SOLN
INTRAVENOUS | Status: AC
  Administered 2015-02-13: 17:00:00 via INTRAVENOUS
  Filled 2015-02-13: qty 2400

## 2015-02-13 MED ORDER — INSULIN GLARGINE 100 UNIT/ML ~~LOC~~ SOLN
40.0000 [IU] | Freq: Two times a day (BID) | SUBCUTANEOUS | Status: DC
  Filled 2015-02-13: qty 0.4

## 2015-02-13 MED ORDER — PHENYLEPHRINE HCL 10 MG/ML IJ SOLN
0.0000 ug/min | INTRAMUSCULAR | Status: DC
  Administered 2015-02-13 (×2): 160 ug/min via INTRAVENOUS
  Administered 2015-02-14 (×2): 170 ug/min via INTRAVENOUS
  Administered 2015-02-14: 80 ug/min via INTRAVENOUS
  Administered 2015-02-14: 110 ug/min via INTRAVENOUS
  Administered 2015-02-15: 30 ug/min via INTRAVENOUS
  Administered 2015-02-16: 35 ug/min via INTRAVENOUS
  Administered 2015-02-17 – 2015-02-18 (×2): 20 ug/min via INTRAVENOUS
  Administered 2015-02-19: 30 ug/min via INTRAVENOUS
  Filled 2015-02-13 (×12): qty 4

## 2015-02-13 MED ORDER — TRACE MINERALS CR-CU-MN-SE-ZN 10-1000-500-60 MCG/ML IV SOLN
INTRAVENOUS | Status: DC
  Filled 2015-02-13: qty 2400

## 2015-02-13 MED ORDER — PRISMASOL B22GK 4/0 22-4 MEQ/L IV SOLN
INTRAVENOUS | Status: DC
  Administered 2015-02-13 – 2015-02-15 (×13): via INTRAVENOUS_CENTRAL
  Filled 2015-02-13 (×32): qty 5000

## 2015-02-13 NOTE — Progress Notes (Signed)
[facility] Progress Note   Subjective: no improvement, remains on pressors, i/os even and wt unchanged. Off CRRT for several hours due to cath clots, now back on.  AM K was 4.5 but istat at 9 am was 7.   Filed Vitals:   02/13/15 0900 02/13/15 1000 02/13/15 1100 02/13/15 1200  BP: 113/56 114/53 128/46 113/56  Pulse: 120 114 113 105  Temp:    99 F (37.2 C)  TempSrc:    Axillary  Resp: Height:      Weight:      SpO2: 96% 95% 93% 96%   Exam: Pale, on vent, sedated No jvd Chest bilat ant rhonchi RRR no MRG Abd obese, + ascites, RUQ drain LE edema mostly resolved bilat Neuro sedated on vent  CXR 8/26 no acute changes UA renal 11-12 cm, no hydro, normal echo     Assessment: 1 AKI prolonged ATN - on CRRT 2 Anemia s/p prbc's 3 RUQ abscess on AB/ drain 4 Obesity 5 DM2  6 Nutrition onTNA 7 Volume - keeping even 8 Hyperkalemia - narrow QRS, would do serum K now before treating, istat could be off some  Plan - cont CRRT, citrate local AC, serum Bethann Berkshire MD  pager [phone]    cell [phone]  02/13/2015, 12:24 PM     Recent Labs Lab 02/12/15 0650  02/12/15 1800  02/13/15 0415 02/13/15 1157 02/13/15 1202  NA 131*  < > 132*  < > 134*  134* 131* 115*  K 4.2  < > 4.0  < > 4.1  4.1 4.0 7.2*  CL 94*  < > 92*  < > 87*  86* 88* 81*  CO2 30  --  29  --  36*  36*  --   --   GLUCOSE 327*  < > 391*  < > 450*  445* 412* >700*  BUN 36*  < > 34*  < > 33*  34* 33* 44*  CREATININE 2.18*  < > 2.08*  < > 2.14*  2.23* 1.70* 2.00*  CALCIUM 8.8*  --  9.1  --  9.6  9.5  --   --   PHOS 3.1  3.1  --  2.6  --  3.1  3.1  --   --   < > = values in this interval not displayed.  Recent Labs Lab 02/08/15 0540  02/10/15 0452  02/12/15 0650 02/12/15 1800 02/13/15 0415  AST 58*  --  78*  --   --   --  36  ALT 19  --  37  --   --   --  27  ALKPHOS 131*  --  138*  --   --   --  107  BILITOT 1.0  --  1.3*  --   --   --  1.6*  PROT 6.6  --   7.3  --   --   --  6.4*  ALBUMIN 2.1*  < > 2.3*  < > 1.9* 1.8* 1.9*  2.0*  < > = values in this interval not displayed.  Recent Labs Lab 02/07/15 0352  02/11/15 0600  02/12/15 0650  02/13/15 0415 02/13/15 1157 02/13/15 1202  WBC 14.3*  < > 22.3*  --  21.6*  --  23.8*  --   --   NEUTROABS 12.1*  --  20.1*  --   --   --  18.7*  --   --  HGB 7.6*  < > 8.5*  < > 8.0*  < > 8.7* 10.5* 10.5*  HCT 23.7*  < > 26.2*  < > 26.2*  < > 27.6* 31.0* 31.0*  MCV 91.2  < > 91.9  --  90.7  --  92.6  --   --   PLT 390  < > 413*  --  250  --  160  --   --   < > = values in this interval not displayed. Marland Kitchen anidulafungin  100 mg Intravenous Q24H  . antiseptic oral rinse  7 mL Mouth Rinse QID  . calcium chloride  1 g Intravenous Once  . chlorhexidine gluconate  15 mL Mouth Rinse BID  . darbepoetin (ARANESP) injection - NON-DIALYSIS  200 mcg Subcutaneous Q Wed-1800  . hydrocortisone sodium succinate  50 mg Intravenous Q6H  . ipratropium  0.5 mg Nebulization Q6H  . levalbuterol  0.63 mg Nebulization Q6H  . meropenem (MERREM) IV  500 mg Intravenous Q6H  . nystatin   Topical TID  . pantoprazole (PROTONIX) IV  40 mg Intravenous Q24H  . sodium chloride  10-40 mL Intracatheter Q12H  . [START ON 02/14/2015] vancomycin  1,000 mg Intravenous Q24H   . Marland KitchenTPN (CLINIMIX-E) Adult 110 mL/hr at 02/12/15 1719  . Marland KitchenTPN (CLINIMIX-E) Adult    . sodium chloride 10 mL/hr at 01/31/15 0508  . calcium gluconate infusion for CRRT 20 g (02/13/15 1118)  . fentaNYL infusion INTRAVENOUS 150 mcg/hr (02/13/15 0900)  . insulin (NOVOLIN-R) infusion 3.3 Units/hr (02/13/15 1207)  . phenylephrine (NEO-SYNEPHRINE) Adult infusion 160 mcg/min (02/13/15 1048)  . dialysis replacement fluid (prismasate) 300 mL/hr at 02/13/15 1118  . dialysate (PRISMASATE) 1,500 mL/hr at 02/13/15 1120  . sodium citrate 2 %/dextrose 2.5% solution 3000 mL 250 mL/hr at 02/13/15 1116  . vasopressin (PITRESSIN) infusion - *FOR SHOCK* 0.03 Units/min (02/13/15 0829)    acetaminophen, fentaNYL, fentaNYL (SUBLIMAZE) injection, heparin, heparin, iohexol, ipratropium, levalbuterol, [DISCONTINUED] ondansetron **OR** ondansetron (ZOFRAN) IV, sodium chloride

## 2015-02-13 NOTE — Progress Notes (Signed)
ANTIBIOTIC CONSULT NOTE  Pharmacy Consult for Vancomycin Indication: infected hematoma s/p cholecystectomy/IAI  No Known Allergies  Patient Measurements: Height:  (167.6 cm) Weight: 258 lb 13.1 oz (117.4 kg) IBW/kg (Calculated) : 63.8   Vital Signs: Temp: 99.2 F (37.3 C) (08/28 0800) Temp Source: Rectal (08/28 0800) BP: 119/48 mmHg (08/28 0800) Pulse Rate: 121 (08/28 0800) Intake/Output from previous day: 08/27 0701 - 08/28 0700 In: 6897.4 [I.V.:3572.4; NG/GT:40; IV Piggyback:550; TPN:2640] Out: 6234 [Urine:47; Emesis/NG output:300; Drains:10] Intake/Output from this shift: Total I/O In: 257.5 [I.V.:117.5; Other:30; TPN:110] Out: 14 [Urine:7; Other:7]  Labs:  Recent Labs  02/11/15 0600  02/12/15 0650  02/12/15 2327 02/13/15 0113 02/13/15 0415  WBC 22.3*  --  21.6*  --   --   --  23.8*  HGB 8.5*  < > 8.0*  < > 10.9* 11.2* 8.7*  PLT 413*  --  250  --   --   --  160  CREATININE 2.56*  < > 2.18*  < > 1.50* 1.50* 2.14*  2.23*  < > = values in this interval not displayed. Estimated Creatinine Clearance: 38.2 mL/min (by C-G formula based on Cr of 2.23). No results for input(s): VANCOTROUGH, VANCOPEAK, VANCORANDOM, GENTTROUGH, GENTPEAK, GENTRANDOM, TOBRATROUGH, TOBRAPEAK, TOBRARND, AMIKACINPEAK, AMIKACINTROU, AMIKACIN in the last 72 hours.   Microbiology: Recent Results (from the past 720 hour(s))  Culture, routine-abscess     Status: None   Collection Time: 01/20/15  1:00 PM  Result Value Ref Range Status   Specimen Description ABSCESS GALL BLADDER FOSSA  Final   Special Requests Normal  Final   Gram Stain   Final    ABUNDANT WBC PRESENT,BOTH PMN AND MONONUCLEAR NO SQUAMOUS EPITHELIAL CELLS SEEN NO ORGANISMS SEEN Performed at [facility]    Culture   Final    MULTIPLE ORGANISMS PRESENT, NONE PREDOMINANT Note: NO STAPHYLOCOCCUS AUREUS ISOLATED NO GROUP A STREP (S.PYOGENES) ISOLATED Performed at [facility]    Report Status 01/23/2015  FINAL  Final  Anaerobic culture     Status: None   Collection Time: 01/20/15  1:00 PM  Result Value Ref Range Status   Specimen Description ABSCESS GALL BLADDER FOSSA  Final   Special Requests Normal  Final   Gram Stain   Final    ABUNDANT WBC PRESENT,BOTH PMN AND MONONUCLEAR NO SQUAMOUS EPITHELIAL CELLS SEEN NO ORGANISMS SEEN Performed at [facility]    Culture   Final    NO ANAEROBES ISOLATED Performed at [facility]    Report Status 01/26/2015 FINAL  Final  MRSA PCR Screening     Status: None   Collection Time: 01/20/15  1:39 PM  Result Value Ref Range Status   MRSA by PCR NEGATIVE NEGATIVE Final    Comment:        The GeneXpert MRSA Assay (FDA approved for NASAL specimens only), is one component of a comprehensive MRSA colonization surveillance program. It is not intended to diagnose MRSA infection nor to guide or monitor treatment for MRSA infections. Performed at [facility]   Culture, blood (routine x 2)     Status: None   Collection Time: 01/20/15  1:50 PM  Result Value Ref Range Status   Specimen Description BLOOD RIGHT ARM  Final   Special Requests IN PEDIATRIC BOTTLE 4CC  Final   Culture   Final    NO GROWTH 5 DAYS Performed at [facility]    Report Status 01/25/2015 FINAL  Final  Culture, blood (  routine x 2)     Status: None   Collection Time: 01/20/15  1:55 PM  Result Value Ref Range Status   Specimen Description BLOOD RIGHT HAND  Final   Special Requests IN PEDIATRIC BOTTLE 4CC  Final   Culture   Final    NO GROWTH 5 DAYS Performed at [facility]    Report Status 01/25/2015 FINAL  Final  Urine culture     Status: None   Collection Time: 01/30/15 11:40 AM  Result Value Ref Range Status   Specimen Description URINE, RANDOM  Final   Special Requests NONE  Final   Culture   Final    >=100,000 COLONIES/mL YEAST Performed at [facility]    Report Status 02/01/2015 FINAL  Final  Culture, body  fluid-bottle     Status: None (Preliminary result)   Collection Time: 02/09/15  2:10 PM  Result Value Ref Range Status   Specimen Description FLUID PERITONEAL  Final   Special Requests BOTTLES DRAWN AEROBIC AND ANAEROBIC 10CC  Final   Culture   Final    NO GROWTH 3 DAYS Performed at [facility]    Report Status PENDING  Incomplete  Gram stain     Status: None   Collection Time: 02/09/15  2:10 PM  Result Value Ref Range Status   Specimen Description FLUID PERITONEAL  Final   Special Requests BOTTLES DRAWN AEROBIC AND ANAEROBIC 10CC  Final   Gram Stain   Final    ABUNDANT WBC PRESENT,BOTH PMN AND MONONUCLEAR NO ORGANISMS SEEN Performed at [facility]    Report Status 02/09/2015 FINAL  Final  Culture, blood (routine x 2)     Status: None (Preliminary result)   Collection Time: 02/10/15  4:00 PM  Result Value Ref Range Status   Specimen Description BLOOD LEFT ANTECUBITAL  Final   Special Requests BOTTLES DRAWN AEROBIC ONLY 7CC  Final   Culture   Final    NO GROWTH 2 DAYS Performed at [facility]    Report Status PENDING  Incomplete  Culture, blood (routine x 2)     Status: None (Preliminary result)   Collection Time: 02/10/15  4:15 PM  Result Value Ref Range Status   Specimen Description BLOOD LEFT FOREARM  Final   Special Requests BOTTLES DRAWN AEROBIC ONLY 5CC  Final   Culture   Final    NO GROWTH 2 DAYS Performed at [facility]    Report Status PENDING  Incomplete   Assessment: 27 yoM presents with increased abdominal pain since cholecystectomy last month. Patient was discharged with a drain in place which was removed last week.  Patient transferred from [facility] on 8/3 with septic shock likely due to infected hematoma in the GB fossa. Antibiotics started for intra-abdominal infection, possible UTI.   8/3 >> Vanc >>  8/10 8/3 >> Zosyn >> 8/24 8/17 >> Diflucan >> 8/23 8/24 >> Meropenem >> 8/25 >> Anidulafungin >> 8/28 >> Vancomycin >>  8/4  blood x 2: neg FINAL 8/4 GB fossa abscess: multiple organisms present, none predominant. No Staph aureus or Group A strep (S. Pyogenes) isolated 8/4 abscess (anaerobic): neg FINAL 8/4 MRSA PCR: negative 8/14 urine: yeast-final 8/24 abscess gall bladder fossa: multiple organisms, no staph aureus or group A strep 8/25 blood x 2: ngtd  Today, 02/13/2015 Day 26 antibiotics Tmax: 99.2 Tmin: 96.7 WBC: rising on hydrocortisone Renal: CRRT, SCr increased overnight to 2.23  Plan:   Vancomycin 15 mg/kg IV loading  dose (1750mg ) x 1 today  Starting 8/29, begin maintenance dose of 10 mg/kg IV q24h (1000mg )  Plan to check vancomycin trough at steady state  Monitor CRRT "off" times closely and adjust administration times as appropriate  F/u cultures, SCr and clinical progress  Loralee Pacas, PharmD, BCPS Pager: [phone]  02/13/2015, 8:52 AM

## 2015-02-13 NOTE — Progress Notes (Signed)
PULMONARY / CRITICAL CARE MEDICINE   Name: Larry [name] MRN: [id number] DOB: 03-20-1946    ADMISSION DATE:  01/19/2015 CONSULTATION DATE:  8/4  REFERRING MD :  [name]  CHIEF COMPLAINT:  Septic shock   INITIAL PRESENTATION:  69 year old male w/ CAF on Elliquis who recently underwent lap chole on 7/14. Was discharged home w/drain. Drain removed about a week before presentation to ER. Admitted directly from [facility] on 8/3 w/ septic shock which was likely due to infected hematoma in the GB fossa. PCCM asked to see after new perc drain placed on 8/4, when he remained hypotensive.    STUDIES:  CT abdomen/Pelvis W/ 7/28 - Gas & fluid at cholecystectomy site w/o discrete abscess. Min ascites. Small right pleural eff. CT abdomen/pelvis w/o 8/5 - Gas between liver & stomach. No additional free air. Increased flow void & high density layering c/w increasing hemorrhage as well as increased right pleural eff. CT abdomen/pelvis 8/18 >> 9.8 cm hematoma with gas in the gallbladder fossa, indwelling pigtail drainage catheter, 12.6 cm hematoma along the posterior R liver, mod ascites, mild wall thickening involving the R colon CT abdomen/pelvis 8/22 >> stable perihepatic hematoma, stable gallbladder fossa collection despite adequate drainage cath placement, significant free fluid within the abdomen/pelvis slightly increased from prior exam  SIGNIFICANT EVENTS: Admitted 8/3 w/ abd pain and hypotension  8/04  hypotensive over-night. Went for [facility] drain of GB fossa. Found what looked like old infected hematoma. PCCM asked to see post-op for shock 8/05  off pressors. WOB worse.  8/06  WOB & wheezing slightly worse. Progressing abdominal pain 8/07  Hgb declining w/ increasing intraperitoneal hemorrhage 8/07  Transfused 2u PRBCs 8/09  work of breathing still significant w/ marked upper airway component  8/10  breathing better. 1.6 liters negative. Slight bump in scr. Vanc stopped. Getting OOB for  first time 8/11  eating regular diet. Up in chair. Feeling better.  Respiratory status improved.  PCCM s/o 8/14  Renal consulted for rising sr cr (to 3.60), baseline sr cr 1.1-1.5 (prior IV contrast 7/28).   8/14  Concern for yeast / bacterial UTI  8/15  ID consulted > abx narrowed to zosyn only, fluconazole for yeast  8/16  UOP increased on IV lasix 8/18  NPO, sr cr improving, CT abd as above  8/18  GB drain upsized per IR to 16 Fr 8/19  Abd pain, eating well.  Diarrhea after ensure.  To PO lasix.  Discussed gastrostomy tube with IR 8/22  Sr Cr rising, intermittent vomiting.  CT abd as above.  Increased confusion 8/23  HD cath inserted, CRRT initiated.  Gastrostomy tube held off due to CRRT & Hgb drop 8/24  WBC elevated, cultures repeated.   8/24  Paracentesis >> 150 ml "bloody bile" fluid removed, loculated and difficult to aspirate 8/25  Palliative care consulted, PCCM consulted.  Vomiting with NGT insertion.  8/26  Intubated early am(0200) for desaturations, significant agitation, delirium   SUBJECTIVE:  cvvhd off as HD clotted off, tpa placed, follows commands  VITAL SIGNS: Temp:  [97.3 F (36.3 C)-99.2 F (37.3 C)] 98.1 F (36.7 C) (08/28 0030) Pulse Rate:  [55-126] 124 (08/28 0730) Resp:  [9-30] 9 (08/28 0730) BP: (62-149)/(29-83) 122/53 mmHg (08/28 0730) SpO2:  [90 %-100 %] 96 % (08/28 0730) FiO2 (%):  [40 %] 40 % (08/28 0753) Weight:  [117.4 kg (258 lb 13.1 oz)] 117.4 kg (258 lb 13.1 oz) (08/28 0500)  HEMODYNAMICS: CVP:  [  3 mmHg-12 mmHg] 12 mmHg   VENTILATOR SETTINGS: Vent Mode:  [-] PRVC FiO2 (%):  [40 %] 40 % Set Rate:  [12 bmp] 12 bmp Vt Set:  [510 mL] 510 mL PEEP:  [5 cmH20] 5 cmH20 Pressure Support:  [5 cmH20-10 cmH20] 5 cmH20 Plateau Pressure:  [12 cmH20-28 cmH20] 12 cmH20   INTAKE / OUTPUT:  Intake/Output Summary (Last 24 hours) at 02/13/15 0756 Last data filed at 02/13/15 0500  Gross per 24 hour  Intake 6214.86 ml  Output   6234 ml  Net -19.14 ml     PHYSICAL EXAMINATION: General:  Acutely ill adult male on  vent Neuro:  Sedated rass 0, FC yes HEENT:  HD cath no discharge Cardiovascular:  s1s2 rrr, no m/r/g Lungs:  Coarse ronchi Abdomen:  RUQ perc drain in place with scant drainage. Hypoactive BS. distended Musculoskeletal:  No joint effusions or deformities. Skin:  gen edema  LABS:  CBC  Recent Labs Lab 02/11/15 0600  02/12/15 0650  02/12/15 2327 02/13/15 0113 02/13/15 0415  WBC 22.3*  --  21.6*  --   --   --  23.8*  HGB 8.5*  < > 8.0*  < > 10.9* 11.2* 8.7*  HCT 26.2*  < > 26.2*  < > 32.0* 33.0* 27.6*  PLT 413*  --  250  --   --   --  160  < > = values in this interval not displayed. Coag's  Recent Labs Lab 02/11/15 0600 02/12/15 0650 02/13/15 0415  APTT 37 34 30   BMET  Recent Labs Lab 02/12/15 0650  02/12/15 1800  02/12/15 2327 02/13/15 0113 02/13/15 0415  NA 131*  < > 132*  < > 134* 135 134*  134*  K 4.2  < > 4.0  < > 3.6 3.7 4.1  4.1  CL 94*  < > 92*  < > 86* 84* 87*  86*  CO2 30  --  29  --   --   --  36*  36*  BUN 36*  < > 34*  < > 27* 27* 33*  34*  CREATININE 2.18*  < > 2.08*  < > 1.50* 1.50* 2.14*  2.23*  GLUCOSE 327*  < > 391*  < > 427* 449* 450*  445*  < > = values in this interval not displayed. Electrolytes  Recent Labs Lab 02/11/15 0600  02/12/15 0650 02/12/15 1800 02/13/15 0415  CALCIUM 8.0*  < > 8.8* 9.1 9.6  9.5  MG 2.2  --  2.2  --  2.0  PHOS 2.5  < > 3.1  3.1 2.6 3.1  3.1  < > = values in this interval not displayed. Sepsis Markers No results for input(s): LATICACIDVEN, PROCALCITON, O2SATVEN in the last 168 hours.   ABG  Recent Labs Lab 02/11/15 0002 02/11/15 0250 02/12/15 1205  PHART 7.352 7.319* 7.428  PCO2ART 37.3 42.0 43.0  PO2ART 57.5* 243* 77.8*   Liver Enzymes  Recent Labs Lab 02/08/15 0540  02/10/15 0452  02/12/15 0650 02/12/15 1800 02/13/15 0415  AST 58*  --  78*  --   --   --  36  ALT 19  --  37  --   --   --  27  ALKPHOS 131*  --   138*  --   --   --  107  BILITOT 1.0  --  1.3*  --   --   --  1.6*  ALBUMIN 2.1*  < > 2.3*  < >  1.9* 1.8* 1.9*  2.0*  < > = values in this interval not displayed.   Cardiac Enzymes No results for input(s): TROPONINI, PROBNP in the last 168 hours.   Glucose  Recent Labs Lab 02/12/15 0440 02/12/15 0822 02/12/15 1225 02/12/15 1600 02/12/15 1954 02/13/15 0055  GLUCAP 340* 307* 362* 400* 384* 397*    Imaging Dg Abd Portable 1v  02/12/2015   CLINICAL DATA:  OG tube placement  EXAM: PORTABLE ABDOMEN - 1 VIEW  COMPARISON:  Portable exam 0958 hours compared to CT abdomen and pelvis of 02/07/2015  FINDINGS: Tip of orogastric tube is at the gastroesophageal junction, recommend advancing tube into stomach.  Percutaneous pigtail drainage catheter RIGHT upper quadrant, by prior CT within gallbladder fossa collection.  Bowel gas pattern is suboptimally visualized due to technique and rotation.  Bones demineralized.  IMPRESSION: Tip of orogastric tube is at gastroesophageal junction, recommend advancing tube into stomach.   Electronically Signed   By: Ulyses Southward M.D.   On: 02/12/2015 14:09    ASSESSMENT / PLAN:  PULMONARY A: Ventilator Dependent Respiratory Failure - in the setting of significant agitation, renal fx, delirium  Chronically elevated right HD Acute Hypoxic Respiratory Failure Right pleural effusion - progessing on Abd CT 8/6 ARF, hypoxia P:   Xopenex neb q6hr PRN Wean today PS 5 (goal) escalate if needed, no extubation planned with neurostatus Repeat pcxr in am  Retract ett 1 cm abg done not in epic, will request copy  CARDIOVASCULAR CVL Right IJ 8/4 >> 8/22 RUE PICC 8/22 >>  A:  Severe sepsis/septic shock - resolved Hypotension - 8/26, started on neo post intubation, ? Sedation related Hx PAF - CHADVASC score 4; currently NSR  Prior NSTEMI  Pressor needed 8/27, prece dex related>? Adrenal insuff P:  Neo for MAP > 60 Tele Keep stress roids Add  vaso  RENAL A:   ARF - in setting of septic shock, prior hypotension/ATN  P:   Cvvhd, even balance goals Chem in am   GASTROINTESTINAL A:   Infected hematoma s/p cholecystectomy - Now s/p CT guided Perc drain with subsequent upsize to 16 Fr GERD - worse 8/5 w/ associated UAW wheeze Intra-abdominal hemorrhage / hematoma - Hgb stable now Nausea / Vomiting - concern for ileus (8/25) Protein Calorie Malnutrition   P:   TNA per pharmacy  Protonix QD NPO Reglan dc if not on TF Place NGT to LIS  Will discuss with surgery to place post pyloric tube and feed gut? Consider in am with IR present  HEMATOLOGIC A:   Anemia - in setting of intra-abdominal hemorrhage (8/4).  S/p 2 u PRBC on 8/4 & 2u 8/7   P:  Trend CBC  SCDs Transfuse for Hbg < 7, not indicated  INFECTIOUS A:   Severe sepsis/septic shock - presume source is infected hematoma from recent Cholecystectomy. Shock resolved.  S/p Perc GB fossa site drain 8/4 - minimal serous output High Risk for Fungemia P:   Trending WBC / Fever curve  ID following, appreciate input   Abscess 8/4>>>neg BCX2 8/4>>>neg  Peritoneal fluid GS 8/24 >> abundant WBC, no organisms seen Peritoneal fluid culture 8/24 >>  BCx2 8/25 >>   Meropenem 8/24 >>  Fluconazole 8/15 >> 8/23 anigulofungin 8/26>>> vanc 8/28>>>  Maintain current regimen If not off pressors , consider addition vanc  ENDOCRINE A: DM - CBG's NOT well controlled AI P:   SSI Q4 Lantus 35 units QHS -increase 40 Lower TPN rate is contributing significant  to hyperglycemia Consider to avoid bid lantus with renal failure, critical illness consider bolus doing insulin IV hourly until improved control then lantus daily dose increase  NEUROLOGIC A:  Abdominal Pain/Peritonitis -  Delirium  Critical Illness Deconditioning  P:   RASS goal:  -1 Supportive care Delirium prevention measures: minimize sedating medications, promote sleep / wake cycle  Fentanyl  drip Passive ROM  WUA  FAMILY  - Updates:  Wife called and updated via phone per NP 8/26.   - Inter-disciplinary family meet or Palliative Care meeting due by:  Ongoing, family wants to continue full aggressive measures.  Hopeful that patient will be able to recover.    Ccm time 30 min   Mcarthur Rossetti. Tyson Alias, MD, FACP Pgr: [phone] [facility]

## 2015-02-13 NOTE — Progress Notes (Signed)
General Surgery Note  LOS: 25 days  POD -     Assessment/Plan: 1. Lap chole with IOC - 01/04/2015 - [facility]  Readmitted 01/19/2015 - Post op course complicated by intaabdominal abscess - has drain in RUQ, but with essentially no drainage  WBC - 23,800 - 02/11/2015  [place]/Eraxis  Last CT of abdomen - 02/07/2015 - complex gall bladder collection, otherwise stable - minimal output of LUQ drain.  No change from general surgery standpoint   2.  VDRF  Weaned some yesterday.  More alert today 3.  ARF - Creatinine - 2.23 - 02/13/2015  on CVVH  Some trouble with lines clotting. 4.  DM  Glucose - 445 - 02/13/2015 5.  CAD  History of A. Fib 6.  Anemia  Hgb - 8.7 - 02/13/2015   Principal Problem:   Septic shock Active Problems:   Anticoagulated   Paroxysmal a-fib   Acute blood loss anemia   Catheter-associated urinary tract infection   Intra-abdominal infection   Candidiasis of urogenital site   Acute respiratory failure with hypoxia   Infected hematoma GB fossa of liver   Leukocytosis   Diabetes mellitus with renal complications   Anemia of chronic disease   Hypokalemia   Hypomagnesemia   Acute renal failure   Protein-calorie malnutrition, severe   AKI (acute kidney injury)   Encounter for palliative care   Acute encephalopathy   Subjective:  Intubated and sedated.    No family in room. Objective:   Filed Vitals:   02/13/15 0600  BP: 131/60  Pulse: 126  Temp:   Resp: 13     Intake/Output from previous day:  08/27 0701 - 08/28 0700 In: 6214.9 [I.V.:3219.9; NG/GT:40; IV Piggyback:550; TPN:2310] Out: 6234 [Urine:47; Emesis/NG output:300; Drains:10]  Intake/Output this shift:  Total I/O In: 2780.8 [I.V.:1630.8; Other:20; NG/GT:40; IV Piggyback:100; TPN:990] Out: 2908 [Urine:15; Emesis/NG output:50; Other:2843]   Physical Exam:   General: WM who is intubated.  He is more alert than yesterday.  Squeezes hand.   HEENT: Normal. Left IJ for CVVH.  The nurses are having some  trouble with this clotting. .   Lungs: Some rhochi.  CXR not much changed.   Abdomen: Rare BS.   Wound: Drain in RUQ, not much coming out (only 10 cc recorded yesterday)     Lab Results:    Recent Labs  02/12/15 0650  02/13/15 0113 02/13/15 0415  WBC 21.6*  --   --  23.8*  HGB 8.0*  < > 11.2* 8.7*  HCT 26.2*  < > 33.0* 27.6*  PLT 250  --   --  160  < > = values in this interval not displayed.  BMET   Recent Labs  02/12/15 1800  02/13/15 0113 02/13/15 0415  NA 132*  < > 135 134*  134*  K 4.0  < > 3.7 4.1  4.1  CL 92*  < > 84* 87*  86*  CO2 29  --   --  36*  36*  GLUCOSE 391*  < > 449* 450*  445*  BUN 34*  < > 27* 33*  34*  CREATININE 2.08*  < > 1.50* 2.14*  2.23*  CALCIUM 9.1  --   --  9.6  9.5  < > = values in this interval not displayed.  PT/INR  No results for input(s): LABPROT, INR in the last 72 hours.  ABG    Recent Labs  02/11/15 0250 02/12/15 1205  PHART 7.319* 7.428  HCO3 21.1 27.9*  Studies/Results:  Dg Chest Port 1 View  02/12/2015   CLINICAL DATA:  Acute respiratory failure. History of hypertension, atrial fibrillation, diabetes and myocardial infarction.  EXAM: PORTABLE CHEST - 1 VIEW  COMPARISON:  02/11/2015 and 02/10/2015.  FINDINGS: 0512 hours. Endotracheal tube tip is unchanged at approximately 2.5 cm above the carina. A right arm PICC tip extends into the right atrium. Left IJ central venous catheter extends to the lower SVC level. Nasogastric tube projects below the diaphragm. There are persistent low volumes with overall slightly improved pulmonary aeration. No edema, confluent airspace opacity, pneumothorax or significant pleural effusion identified. The heart size and mediastinal contours are stable.  IMPRESSION: Interval slight improved aeration of the lungs. Stable support system.   Electronically Signed   By: Carey Bullocks M.D.   On: 02/12/2015 09:08   Dg Abd Portable 1v  02/12/2015   CLINICAL DATA:  OG tube placement  EXAM:  PORTABLE ABDOMEN - 1 VIEW  COMPARISON:  Portable exam 0958 hours compared to CT abdomen and pelvis of 02/07/2015  FINDINGS: Tip of orogastric tube is at the gastroesophageal junction, recommend advancing tube into stomach.  Percutaneous pigtail drainage catheter RIGHT upper quadrant, by prior CT within gallbladder fossa collection.  Bowel gas pattern is suboptimally visualized due to technique and rotation.  Bones demineralized.  IMPRESSION: Tip of orogastric tube is at gastroesophageal junction, recommend advancing tube into stomach.   Electronically Signed   By: Ulyses Southward M.D.   On: 02/12/2015 14:09     Anti-infectives:   Anti-infectives    Start     Dose/Rate Route Frequency Ordered Stop   02/12/15 1630  meropenem (MERREM) 500 mg in sodium chloride 0.9 % 50 mL IVPB     500 mg 100 mL/hr over 30 Minutes Intravenous Every 6 hours 02/12/15 1106     02/11/15 1800  anidulafungin (ERAXIS) 100 mg in sodium chloride 0.9 % 100 mL IVPB     100 mg over 90 Minutes Intravenous Every 24 hours 02/10/15 1744     02/10/15 1730  anidulafungin (ERAXIS) 200 mg in sodium chloride 0.9 % 200 mL IVPB     200 mg over 180 Minutes Intravenous  Once 02/10/15 1720 02/10/15 2230   02/10/15 0200  meropenem (MERREM) 500 mg in sodium chloride 0.9 % 50 mL IVPB  Status:  Discontinued     500 mg 100 mL/hr over 30 Minutes Intravenous Every 8 hours 02/09/15 1808 02/12/15 1106   02/10/15 0000  meropenem (MERREM) 500 mg in sodium chloride 0.9 % 50 mL IVPB  Status:  Discontinued     500 mg 100 mL/hr over 30 Minutes Intravenous 4 times per day 02/09/15 1655 02/09/15 1808   02/09/15 1800  meropenem (MERREM) 1 g in sodium chloride 0.9 % 100 mL IVPB     1 g 200 mL/hr over 30 Minutes Intravenous  Once 02/09/15 1655 02/09/15 1818   02/08/15 1800  piperacillin-tazobactam (ZOSYN) IVPB 3.375 g  Status:  Discontinued     3.375 g 100 mL/hr over 30 Minutes Intravenous 4 times per day 02/08/15 1403 02/09/15 1655   02/07/15 1400   piperacillin-tazobactam (ZOSYN) IVPB 2.25 g  Status:  Discontinued     2.25 g 100 mL/hr over 30 Minutes Intravenous 3 times per day 02/07/15 0750 02/08/15 1403   02/04/15 1200  piperacillin-tazobactam (ZOSYN) IVPB 2.25 g  Status:  Discontinued     2.25 g 100 mL/hr over 30 Minutes Intravenous 4 times per day 02/04/15 0859 02/04/15 0905  02/04/15 1200  piperacillin-tazobactam (ZOSYN) IVPB 3.375 g  Status:  Discontinued     3.375 g 12.5 mL/hr over 240 Minutes Intravenous Every 8 hours 02/04/15 0905 02/07/15 0750   02/02/15 2000  fluconazole (DIFLUCAN) tablet 100 mg     100 mg Oral Every 24 hours 02/02/15 1908 02/08/15 2041   02/02/15 1800  fluconazole (DIFLUCAN) tablet 100 mg  Status:  Discontinued     100 mg Oral Daily 02/02/15 1545 02/02/15 1901   01/31/15 1000  fluconazole (DIFLUCAN) tablet 100 mg  Status:  Discontinued     100 mg Oral Daily 01/31/15 0727 02/02/15 1313   01/29/15 1700  piperacillin-tazobactam (ZOSYN) IVPB 2.25 g  Status:  Discontinued     2.25 g 100 mL/hr over 30 Minutes Intravenous Every 8 hours 01/29/15 0845 02/04/15 0859   01/24/15 2200  vancomycin (VANCOCIN) IVPB 1000 mg/200 mL premix     1,000 mg 200 mL/hr over 60 Minutes Intravenous  Once 01/24/15 1349 01/24/15 2301   01/21/15 2000  vancomycin (VANCOCIN) 1,500 mg in sodium chloride 0.9 % 500 mL IVPB  Status:  Discontinued     1,500 mg 250 mL/hr over 120 Minutes Intravenous Every 24 hours 01/21/15 1048 01/23/15 1726   01/20/15 2000  vancomycin (VANCOCIN) 1,250 mg in sodium chloride 0.9 % 250 mL IVPB  Status:  Discontinued     1,250 mg 166.7 mL/hr over 90 Minutes Intravenous Every 24 hours 01/19/15 1945 01/20/15 0843   01/20/15 2000  vancomycin (VANCOCIN) 1,250 mg in sodium chloride 0.9 % 250 mL IVPB  Status:  Discontinued     1,250 mg 166.7 mL/hr over 90 Minutes Intravenous Every 24 hours 01/20/15 1519 01/21/15 1048   01/20/15 0000  piperacillin-tazobactam (ZOSYN) IVPB 3.375 g  Status:  Discontinued     3.375  g 12.5 mL/hr over 240 Minutes Intravenous Every 8 hours 01/19/15 1946 01/29/15 0845   01/19/15 1715  vancomycin (VANCOCIN) 2,500 mg in sodium chloride 0.9 % 500 mL IVPB     2,500 mg 250 mL/hr over 120 Minutes Intravenous  Once 01/19/15 1707 01/19/15 2159   01/19/15 1715  piperacillin-tazobactam (ZOSYN) IVPB 3.375 g     3.375 g 100 mL/hr over 30 Minutes Intravenous  Once 01/19/15 1707 01/19/15 1900      Ovidio Kin, MD, FACS Pager: [phone] [facility] Office: [phone] 02/13/2015

## 2015-02-13 NOTE — Progress Notes (Signed)
iStat Chem8 blood draw for 1200 was not accurate (K 7.2). Serum BMET drawn and result was 4.4. Critical Care MD aware and Nephrology MD aware of old and new result.

## 2015-02-13 NOTE — Progress Notes (Signed)
Nutrition Follow-up  DOCUMENTATION CODES:   Severe malnutrition in context of acute illness/injury, Morbid obesity  INTERVENTION:   -TPN per Pharmacy -Recommend when medically appropriate initiate trickle feeds of Nepro Carb Steady @ 10 ml/hr. Will provide 432 kcal, 19g protein and 175 ml of H2O.  TF recommendations: - If Panda vs PEG able to be placed, recommend Nepro @ 35 mL/hr with 60 mL Prostat TID which will provide 2112 kcal, 158 grams protein, and 610 mL free water  -RD to continue to monitor   NUTRITION DIAGNOSIS:   Inadequate oral intake related to lethargy/confusion as evidenced by meal completion < 25%.  Ongoing. On vent.  GOAL:   Provide needs based on ASPEN/SCCM guidelines  Progressing.  MONITOR:   Vent status, Labs, Weight trends, Skin, I & O's, Other (Comment) (TPN regimen)   ASSESSMENT:   69 y/o admitted in 10/05/2012 with Sepsis, hypotension, acute renal failure and acute cholecystitis. He had been place on Eliquis for his AF by the [place]. He had an MI 2 years ago and did not follow up with his cardiologist. He was acutely ill and underwent a percutaneous drain placement on 10/06/14 by IR. He went home on 10/11/14. He was readmitted on 12/30/14 and underwent laparoscopic cholecystectomy with IOC. He was left with a drain in place and the site had "SNOW' placed in the GB fossa for hemostasis after the procedure. Drain was removed last week but there was some brusing at the site. He is transferred from [facility] with complaints of increased abdominal pain.  Patient is currently intubated on ventilator support MV: 8.3 L/min Temp (24hrs), Avg:98.2 F (36.8 C), Min:97.3 F (36.3 C), Max:99.2 F (37.3 C)  Propofol: none  Plan per Pharmacy: At 1800 today:  Decrease Clinimix 5/15 (WITH electrolytes) to 160ml/hr - this is not providing 100% protein needs but do not want to worsen fluid overload or hyperglycemia - discussed with Dr. Tyson Alias.   Increase  regular insulin in TPN so that patient receives 120 units regular insulin (150 units/3L bag)  CCM increased Lantus to 60 units daily at 10AM and checking hourly CBGs, dosing IV insulin based on results in order to avoid starting glucose stabilizer.  Goal:  Clinimix 5/15 at a goal rate of 119ml/hr to provide: 120g/day protein, 1704 Kcal/day.  Labs reviewed: CBGs: >600 Low Na Elevated BUN & Creatinine Mg/Phos WNL  Diet Order:  Diet NPO time specified .TPN (CLINIMIX-E) Adult .TPN (CLINIMIX-E) Adult  Skin:  Wound (see comment) (abdominal incision)  Last BM:  8/27  Height:   Ht Readings from Last 1 Encounters:  01/20/15  (1.676 m)    Weight:   Wt Readings from Last 1 Encounters:  02/13/15 258 lb 13.1 oz (117.4 kg)    Ideal Body Weight:  64.54 kg (kg)  BMI:  Body mass index is 41.79 kg/(m^2).  Estimated Nutritional Needs:   Kcal:  2089  Protein:  150-170g  Fluid:  1.5L/day  EDUCATION NEEDS:   No education needs identified at this time  Tilda Franco, MS, RD, LDN Pager: [phone] After Hours Pager: [phone]

## 2015-02-13 NOTE — Progress Notes (Addendum)
[place] NOTE   Pharmacy Consult for TPN Indication: Prolonged ileus  No Known Allergies  Patient Measurements: Height: 5\' 6"  (167.6 cm) Weight: 258 lb 13.1 oz (117.4 kg) IBW/kg (Calculated) : 63.8 Adjusted Body Weight: 78kg Usual Weight:   Vital Signs: Temp: 99.2 F (37.3 C) (08/28 0800) Temp Source: Rectal (08/28 0800) BP: 119/48 mmHg (08/28 0800) Pulse Rate: 121 (08/28 0800) Intake/Output from previous day: 08/27 0701 - 08/28 0700 In: 6897.4 [I.V.:3572.4; NG/GT:40; IV Piggyback:550; TPN:2640] Out: 6234 [Urine:47; Emesis/NG output:300; Drains:10] Intake/Output from this shift: Total I/O In: 257.5 [I.V.:117.5; Other:30; TPN:110] Out: 14 [Urine:7; Other:7]  Labs:  Recent Labs  02/11/15 0600  02/12/15 0650  02/12/15 2327 02/13/15 0113 02/13/15 0415  WBC 22.3*  --  21.6*  --   --   --  23.8*  HGB 8.5*  < > 8.0*  < > 10.9* 11.2* 8.7*  HCT 26.2*  < > 26.2*  < > 32.0* 33.0* 27.6*  PLT 413*  --  250  --   --   --  160  APTT 37  --  34  --   --   --  30  < > = values in this interval not displayed.   Recent Labs  02/11/15 0600  02/12/15 0650  02/12/15 1800  02/12/15 2327 02/13/15 0113 02/13/15 0415  NA 131*  < > 131*  < > 132*  < > 134* 135 134*  134*  K 4.2  < > 4.2  < > 4.0  < > 3.6 3.7 4.1  4.1  CL 100*  < > 94*  < > 92*  < > 86* 84* 87*  86*  CO2 23  < > 30  --  29  --   --   --  36*  36*  GLUCOSE 275*  < > 327*  < > 391*  < > 427* 449* 450*  445*  BUN 32*  < > 36*  < > 34*  < > 27* 27* 33*  34*  CREATININE 2.56*  < > 2.18*  < > 2.08*  < > 1.50* 1.50* 2.14*  2.23*  CALCIUM 8.0*  < > 8.8*  --  9.1  --   --   --  9.6  9.5  MG 2.2  --  2.2  --   --   --   --   --  2.0  PHOS 2.5  < > 3.1  3.1  --  2.6  --   --   --  3.1  3.1  PROT  --   --   --   --   --   --   --   --  6.4*  ALBUMIN 2.2*  < > 1.9*  --  1.8*  --   --   --  1.9*  2.0*  AST  --   --   --   --   --   --   --   --  36  ALT  --   --   --   --   --   --   --   --   27  ALKPHOS  --   --   --   --   --   --   --   --  107  BILITOT  --   --   --   --   --   --   --   --  1.6*  < > =  values in this interval not displayed. Estimated Creatinine Clearance: 38.2 mL/min (by C-G formula based on Cr of 2.23).    Recent Labs  02/13/15 0055 02/13/15 0622 02/13/15 0814  GLUCAP 397* 427* >600*    Insulin Requirements:  103 units novolog/24h, lantus 4035 units qhs, 80 units regular insulin in TPN - stress dose hydrocortisone started 8/27 - calcium gluconate and phenylephrine drips mixed in dextrose  Current Nutrition: NPO on vent, unable to tolerate TF  IVF: NS KVO  Central access: PICC  TPN start date:  8/22  ASSESSMENT                                                                                                          HPI: 25 YOM presents with worsening abdominal pain on 8/3.  Recent cholecystectomy on 01/04/2015 with drain removed 2 weeks prior to admission. Found to have GB fossa fluid collection (infected hematoma) and abscess. IR placed GB fossa drain.  Pharmacy asked to start TPN 8/22 for prolonged ileus and interment vomiting prohibiting use of enteral nutrition  Significant events:  8/3 septic shock from infected GB fossa 8/22 orders to start TPN 8/23 Start CRRT 8/25 intubated  Today:    Glucose - uncontrolled hyperglycemia, worsening  Electrolytes:  HypoNa improved (134)  K+ WNL and improved after replacement yesterday (4.1)  Mg/Phos WNL  Corr Ca slightly elevated (11.1) after replacement with 2g CaGluc bolus yesterday and continuous CaGluc infusion  Renal - oliguric AKI. CRRT  8/23  24h I/O = +486ml,   SCr overall improving but increased overnight (2.23)  LFTs - AST/ALT, Alk Phos improved to WNL, tbili slightly elevated  TGs - 155 (8/23), recheck in AM  Prealbumin - 5.3 (8/22), recheck in AM  NUTRITIONAL GOALS                                                                                             Updated RD recs  with CRRT and ventilator 8/26: 2129 kcals, protein 150-170 g (fluid 1.5L/day) Clinimix 5/15 at a goal rate of 125ml/hr + 20% fat emulsion at 72ml/hr to provide: 132g/day protein, 2354Kcal/day.  Will be unable to meet current goal protein needs with premixed Clinimix formulation   PLAN  At 1800 today:  Decrease Clinimix 5/15 (WITH electrolytes) to 119ml/hr - this is not providing 100% protein needs but do not want to worsen fluid overload or hyperglycemia - discussed with Dr. Titus Mould.    Increase regular insulin in TPN so that patient receives 120 units regular insulin (150 units/3L bag)  CCM increased Lantus to 60 units daily at 10AM and checking hourly CBGs, dosing IV insulin based on results in order to avoid starting glucose stabilizer.  Hold lipid emulsions as changed to ICU status as of 8/23 (hold lipids for the first week of ICU admission).   Monitor for signs of refeeding syndrome - none currently  TPN to contain standard multivitamins and trace elements.  Daily renal function panel, Mg and Phos ordered per MD   F/u daily.  Peggyann Juba, PharmD, BCPS Pager: [phone]  02/13/2015,9:00 AM   Addendum: Dr. Titus Mould instructed pharmacy to remove all insulin from TPN today since now adding insulin drip.  Will do this with new TPN bag to be hung at [facility], PharmD, BCPS 02/13/2015 10:01 AM

## 2015-02-13 NOTE — Progress Notes (Signed)
Per CCM, ETT retracted 1CM from 24cm to 23cm.

## 2015-02-13 NOTE — Progress Notes (Signed)
eLink Physician-Brief Progress Note Patient Name: Larry [name] DOB: 09/06/1945 MRN: [id number]   Date of Service  02/13/2015  HPI/Events of Note  Hyperglycemia worsened by addition of steroids  eICU Interventions  lantus increased from 40 units daily to 40 units BID.     Intervention Category Intermediate Interventions: Hyperglycemia - evaluation and treatment  Henry Russel, P 02/13/2015, 6:38 AM

## 2015-02-14 ENCOUNTER — Inpatient Hospital Stay (HOSPITAL_COMMUNITY): Payer: Medicare Other

## 2015-02-14 LAB — POCT I-STAT, CHEM 8
BUN: 28 mg/dL — AB (ref 6–20)
BUN: 28 mg/dL — ABNORMAL HIGH (ref 6–20)
BUN: 29 mg/dL — AB (ref 6–20)
BUN: 29 mg/dL — ABNORMAL HIGH (ref 6–20)
BUN: 30 mg/dL — AB (ref 6–20)
BUN: 30 mg/dL — ABNORMAL HIGH (ref 6–20)
BUN: 30 mg/dL — ABNORMAL HIGH (ref 6–20)
BUN: 30 mg/dL — ABNORMAL HIGH (ref 6–20)
BUN: 35 mg/dL — AB (ref 6–20)
BUN: 35 mg/dL — ABNORMAL HIGH (ref 6–20)
BUN: 35 mg/dL — ABNORMAL HIGH (ref 6–20)
BUN: 36 mg/dL — AB (ref 6–20)
BUN: 36 mg/dL — ABNORMAL HIGH (ref 6–20)
BUN: 36 mg/dL — ABNORMAL HIGH (ref 6–20)
BUN: 38 mg/dL — ABNORMAL HIGH (ref 6–20)
CALCIUM ION: 0.39 mmol/L — AB (ref 1.13–1.30)
CALCIUM ION: 1.07 mmol/L — AB (ref 1.13–1.30)
CALCIUM ION: 0.36 mmol/L — AB (ref 1.13–1.30)
CALCIUM ION: 1.06 mmol/L — AB (ref 1.13–1.30)
CALCIUM ION: 0.34 mmol/L — AB (ref 1.13–1.30)
CHLORIDE: 89 mmol/L — AB (ref 101–111)
CHLORIDE: 90 mmol/L — AB (ref 101–111)
CHLORIDE: 90 mmol/L — AB (ref 101–111)
CHLORIDE: 91 mmol/L — AB (ref 101–111)
CHLORIDE: 91 mmol/L — AB (ref 101–111)
CHLORIDE: 91 mmol/L — AB (ref 101–111)
CHLORIDE: 92 mmol/L — AB (ref 101–111)
CHLORIDE: 91 mmol/L — AB (ref 101–111)
CREATININE: 2 mg/dL — AB (ref 0.61–1.24)
CREATININE: 1.5 mg/dL — AB (ref 0.61–1.24)
Calcium, Ion: 0.35 mmol/L — CL (ref 1.13–1.30)
Calcium, Ion: 0.35 mmol/L — CL (ref 1.13–1.30)
Calcium, Ion: 0.36 mmol/L — CL (ref 1.13–1.30)
Calcium, Ion: 0.36 mmol/L — CL (ref 1.13–1.30)
Calcium, Ion: 0.39 mmol/L — CL (ref 1.13–1.30)
Calcium, Ion: 0.96 mmol/L — ABNORMAL LOW (ref 1.13–1.30)
Calcium, Ion: 1 mmol/L — ABNORMAL LOW (ref 1.13–1.30)
Calcium, Ion: 1.01 mmol/L — ABNORMAL LOW (ref 1.13–1.30)
Calcium, Ion: 1.02 mmol/L — ABNORMAL LOW (ref 1.13–1.30)
Calcium, Ion: 1.06 mmol/L — ABNORMAL LOW (ref 1.13–1.30)
Chloride: 88 mmol/L — ABNORMAL LOW (ref 101–111)
Chloride: 88 mmol/L — ABNORMAL LOW (ref 101–111)
Chloride: 88 mmol/L — ABNORMAL LOW (ref 101–111)
Chloride: 89 mmol/L — ABNORMAL LOW (ref 101–111)
Chloride: 90 mmol/L — ABNORMAL LOW (ref 101–111)
Chloride: 92 mmol/L — ABNORMAL LOW (ref 101–111)
Chloride: 92 mmol/L — ABNORMAL LOW (ref 101–111)
Creatinine, Ser: 1.4 mg/dL — ABNORMAL HIGH (ref 0.61–1.24)
Creatinine, Ser: 1.4 mg/dL — ABNORMAL HIGH (ref 0.61–1.24)
Creatinine, Ser: 1.5 mg/dL — ABNORMAL HIGH (ref 0.61–1.24)
Creatinine, Ser: 1.5 mg/dL — ABNORMAL HIGH (ref 0.61–1.24)
Creatinine, Ser: 1.5 mg/dL — ABNORMAL HIGH (ref 0.61–1.24)
Creatinine, Ser: 1.6 mg/dL — ABNORMAL HIGH (ref 0.61–1.24)
Creatinine, Ser: 1.7 mg/dL — ABNORMAL HIGH (ref 0.61–1.24)
Creatinine, Ser: 1.9 mg/dL — ABNORMAL HIGH (ref 0.61–1.24)
Creatinine, Ser: 2 mg/dL — ABNORMAL HIGH (ref 0.61–1.24)
Creatinine, Ser: 2 mg/dL — ABNORMAL HIGH (ref 0.61–1.24)
Creatinine, Ser: 2.1 mg/dL — ABNORMAL HIGH (ref 0.61–1.24)
Creatinine, Ser: 2.1 mg/dL — ABNORMAL HIGH (ref 0.61–1.24)
Creatinine, Ser: 2.1 mg/dL — ABNORMAL HIGH (ref 0.61–1.24)
GLUCOSE: 152 mg/dL — AB (ref 65–99)
GLUCOSE: 203 mg/dL — AB (ref 65–99)
GLUCOSE: 205 mg/dL — AB (ref 65–99)
GLUCOSE: 213 mg/dL — AB (ref 65–99)
GLUCOSE: 218 mg/dL — AB (ref 65–99)
Glucose, Bld: 141 mg/dL — ABNORMAL HIGH (ref 65–99)
Glucose, Bld: 158 mg/dL — ABNORMAL HIGH (ref 65–99)
Glucose, Bld: 158 mg/dL — ABNORMAL HIGH (ref 65–99)
Glucose, Bld: 162 mg/dL — ABNORMAL HIGH (ref 65–99)
Glucose, Bld: 181 mg/dL — ABNORMAL HIGH (ref 65–99)
Glucose, Bld: 196 mg/dL — ABNORMAL HIGH (ref 65–99)
Glucose, Bld: 208 mg/dL — ABNORMAL HIGH (ref 65–99)
Glucose, Bld: 217 mg/dL — ABNORMAL HIGH (ref 65–99)
Glucose, Bld: 220 mg/dL — ABNORMAL HIGH (ref 65–99)
Glucose, Bld: 244 mg/dL — ABNORMAL HIGH (ref 65–99)
HCT: 24 % — ABNORMAL LOW (ref 39.0–52.0)
HCT: 29 % — ABNORMAL LOW (ref 39.0–52.0)
HCT: 30 % — ABNORMAL LOW (ref 39.0–52.0)
HCT: 27 % — ABNORMAL LOW (ref 39.0–52.0)
HEMATOCRIT: 23 % — AB (ref 39.0–52.0)
HEMATOCRIT: 26 % — AB (ref 39.0–52.0)
HEMATOCRIT: 27 % — AB (ref 39.0–52.0)
HEMATOCRIT: 27 % — AB (ref 39.0–52.0)
HEMATOCRIT: 27 % — AB (ref 39.0–52.0)
HEMATOCRIT: 28 % — AB (ref 39.0–52.0)
HEMATOCRIT: 29 % — AB (ref 39.0–52.0)
HEMATOCRIT: 29 % — AB (ref 39.0–52.0)
HEMATOCRIT: 30 % — AB (ref 39.0–52.0)
HEMATOCRIT: 30 % — AB (ref 39.0–52.0)
HEMATOCRIT: 50 % (ref 39.0–52.0)
HEMOGLOBIN: 10.2 g/dL — AB (ref 13.0–17.0)
HEMOGLOBIN: 10.2 g/dL — AB (ref 13.0–17.0)
HEMOGLOBIN: 17 g/dL (ref 13.0–17.0)
HEMOGLOBIN: 8.2 g/dL — AB (ref 13.0–17.0)
HEMOGLOBIN: 9.2 g/dL — AB (ref 13.0–17.0)
HEMOGLOBIN: 9.2 g/dL — AB (ref 13.0–17.0)
HEMOGLOBIN: 9.2 g/dL — AB (ref 13.0–17.0)
HEMOGLOBIN: 9.2 g/dL — AB (ref 13.0–17.0)
HEMOGLOBIN: 9.9 g/dL — AB (ref 13.0–17.0)
HEMOGLOBIN: 9.9 g/dL — AB (ref 13.0–17.0)
Hemoglobin: 10.2 g/dL — ABNORMAL LOW (ref 13.0–17.0)
Hemoglobin: 9.9 g/dL — ABNORMAL LOW (ref 13.0–17.0)
Hemoglobin: 9.5 g/dL — ABNORMAL LOW (ref 13.0–17.0)
Hemoglobin: 7.8 g/dL — ABNORMAL LOW (ref 13.0–17.0)
Hemoglobin: 8.8 g/dL — ABNORMAL LOW (ref 13.0–17.0)
POTASSIUM: 4 mmol/L (ref 3.5–5.1)
POTASSIUM: 4.1 mmol/L (ref 3.5–5.1)
POTASSIUM: 4.1 mmol/L (ref 3.5–5.1)
POTASSIUM: 4.1 mmol/L (ref 3.5–5.1)
POTASSIUM: 4.2 mmol/L (ref 3.5–5.1)
POTASSIUM: 4.3 mmol/L (ref 3.5–5.1)
Potassium: 3.9 mmol/L (ref 3.5–5.1)
Potassium: 3.9 mmol/L (ref 3.5–5.1)
Potassium: 3.9 mmol/L (ref 3.5–5.1)
Potassium: 3.9 mmol/L (ref 3.5–5.1)
Potassium: 4 mmol/L (ref 3.5–5.1)
Potassium: 4.1 mmol/L (ref 3.5–5.1)
Potassium: 4.1 mmol/L (ref 3.5–5.1)
Potassium: 4.1 mmol/L (ref 3.5–5.1)
Potassium: 4.4 mmol/L (ref 3.5–5.1)
SODIUM: 133 mmol/L — AB (ref 135–145)
SODIUM: 134 mmol/L — AB (ref 135–145)
SODIUM: 134 mmol/L — AB (ref 135–145)
SODIUM: 135 mmol/L (ref 135–145)
SODIUM: 136 mmol/L (ref 135–145)
SODIUM: 137 mmol/L (ref 135–145)
SODIUM: 137 mmol/L (ref 135–145)
SODIUM: 137 mmol/L (ref 135–145)
SODIUM: 137 mmol/L (ref 135–145)
SODIUM: 138 mmol/L (ref 135–145)
SODIUM: 139 mmol/L (ref 135–145)
Sodium: 133 mmol/L — ABNORMAL LOW (ref 135–145)
Sodium: 134 mmol/L — ABNORMAL LOW (ref 135–145)
Sodium: 135 mmol/L (ref 135–145)
Sodium: 137 mmol/L (ref 135–145)
TCO2: 27 mmol/L (ref 0–100)
TCO2: 27 mmol/L (ref 0–100)
TCO2: 28 mmol/L (ref 0–100)
TCO2: 28 mmol/L (ref 0–100)
TCO2: 29 mmol/L (ref 0–100)
TCO2: 29 mmol/L (ref 0–100)
TCO2: 30 mmol/L (ref 0–100)
TCO2: 30 mmol/L (ref 0–100)
TCO2: 30 mmol/L (ref 0–100)
TCO2: 31 mmol/L (ref 0–100)
TCO2: 31 mmol/L (ref 0–100)
TCO2: 32 mmol/L (ref 0–100)
TCO2: 32 mmol/L (ref 0–100)
TCO2: 33 mmol/L (ref 0–100)
TCO2: 33 mmol/L (ref 0–100)

## 2015-02-14 LAB — RENAL FUNCTION PANEL
Albumin: 1.9 g/dL — ABNORMAL LOW (ref 3.5–5.0)
Anion gap: 5 (ref 5–15)
BUN: 38 mg/dL — AB (ref 6–20)
CHLORIDE: 94 mmol/L — AB (ref 101–111)
CO2: 36 mmol/L — AB (ref 22–32)
CREATININE: 2.06 mg/dL — AB (ref 0.61–1.24)
Calcium: 8.1 mg/dL — ABNORMAL LOW (ref 8.9–10.3)
GFR calc non Af Amer: 31 mL/min — ABNORMAL LOW (ref >60)
GFR, EST AFRICAN AMERICAN: 36 mL/min — AB (ref >60)
Glucose, Bld: 150 mg/dL — ABNORMAL HIGH (ref 65–99)
POTASSIUM: 4.3 mmol/L (ref 3.5–5.1)
Phosphorus: 3.1 mg/dL (ref 2.5–4.6)
Sodium: 135 mmol/L (ref 135–145)

## 2015-02-14 LAB — GLUCOSE, CAPILLARY
GLUCOSE-CAPILLARY: 139 mg/dL — AB (ref 65–99)
GLUCOSE-CAPILLARY: 139 mg/dL — AB (ref 65–99)
GLUCOSE-CAPILLARY: 140 mg/dL — AB (ref 65–99)
GLUCOSE-CAPILLARY: 165 mg/dL — AB (ref 65–99)
GLUCOSE-CAPILLARY: 173 mg/dL — AB (ref 65–99)
GLUCOSE-CAPILLARY: 176 mg/dL — AB (ref 65–99)
GLUCOSE-CAPILLARY: 176 mg/dL — AB (ref 65–99)
GLUCOSE-CAPILLARY: 190 mg/dL — AB (ref 65–99)
GLUCOSE-CAPILLARY: 222 mg/dL — AB (ref 65–99)
Glucose-Capillary: 160 mg/dL — ABNORMAL HIGH (ref 65–99)
Glucose-Capillary: 141 mg/dL — ABNORMAL HIGH (ref 65–99)
Glucose-Capillary: 144 mg/dL — ABNORMAL HIGH (ref 65–99)
Glucose-Capillary: 136 mg/dL — ABNORMAL HIGH (ref 65–99)
Glucose-Capillary: 179 mg/dL — ABNORMAL HIGH (ref 65–99)
Glucose-Capillary: 148 mg/dL — ABNORMAL HIGH (ref 65–99)
Glucose-Capillary: 163 mg/dL — ABNORMAL HIGH (ref 65–99)
Glucose-Capillary: 180 mg/dL — ABNORMAL HIGH (ref 65–99)

## 2015-02-14 LAB — COMPREHENSIVE METABOLIC PANEL
ALT: 33 U/L (ref 17–63)
AST: 55 U/L — ABNORMAL HIGH (ref 15–41)
Albumin: 1.9 g/dL — ABNORMAL LOW (ref 3.5–5.0)
Alkaline Phosphatase: 100 U/L (ref 38–126)
Anion gap: 8 (ref 5–15)
BUN: 38 mg/dL — ABNORMAL HIGH (ref 6–20)
CHLORIDE: 93 mmol/L — AB (ref 101–111)
CO2: 36 mmol/L — ABNORMAL HIGH (ref 22–32)
CREATININE: 2.06 mg/dL — AB (ref 0.61–1.24)
Calcium: 8.2 mg/dL — ABNORMAL LOW (ref 8.9–10.3)
GFR, EST AFRICAN AMERICAN: 36 mL/min — AB (ref >60)
GFR, EST NON AFRICAN AMERICAN: 31 mL/min — AB (ref >60)
Glucose, Bld: 146 mg/dL — ABNORMAL HIGH (ref 65–99)
Potassium: 4.3 mmol/L (ref 3.5–5.1)
Sodium: 137 mmol/L (ref 135–145)
Total Bilirubin: 2 mg/dL — ABNORMAL HIGH (ref 0.3–1.2)
Total Protein: 6.5 g/dL (ref 6.5–8.1)

## 2015-02-14 LAB — TRIGLYCERIDES: TRIGLYCERIDES: 175 mg/dL — AB (ref <150)

## 2015-02-14 LAB — CBC WITH DIFFERENTIAL/PLATELET
Basophils Absolute: 0.3 10*3/uL — ABNORMAL HIGH (ref 0.0–0.1)
Basophils Relative: 1 % (ref 0–1)
EOS PCT: 0 % (ref 0–5)
Eosinophils Absolute: 0 10*3/uL (ref 0.0–0.7)
HEMATOCRIT: 23.8 % — AB (ref 39.0–52.0)
HEMOGLOBIN: 7.6 g/dL — AB (ref 13.0–17.0)
LYMPHS ABS: 1.5 10*3/uL (ref 0.7–4.0)
LYMPHS PCT: 6 % — AB (ref 12–46)
MCH: 30.2 pg (ref 26.0–34.0)
MCHC: 31.9 g/dL (ref 30.0–36.0)
MCV: 94.4 fL (ref 78.0–100.0)
MONOS PCT: 11 % (ref 3–12)
Monocytes Absolute: 2.8 10*3/uL — ABNORMAL HIGH (ref 0.1–1.0)
Neutro Abs: 20.5 10*3/uL — ABNORMAL HIGH (ref 1.7–7.7)
Neutrophils Relative %: 82 % — ABNORMAL HIGH (ref 43–77)
Platelets: 156 10*3/uL (ref 150–400)
RBC: 2.52 MIL/uL — AB (ref 4.22–5.81)
RDW: 20.9 % — ABNORMAL HIGH (ref 11.5–15.5)
WBC: 25.1 10*3/uL — AB (ref 4.0–10.5)

## 2015-02-14 LAB — BLOOD GAS, ARTERIAL
Acid-Base Excess: 8.3 mmol/L — ABNORMAL HIGH (ref 0.0–2.0)
Bicarbonate: 33.3 mEq/L — ABNORMAL HIGH (ref 20.0–24.0)
Drawn by: 295031
FIO2: 0.4
O2 SAT: 94.3 %
PCO2 ART: 52.3 mmHg — AB (ref 35.0–45.0)
PEEP/CPAP: 5 cmH2O
Patient temperature: 98.6
Pressure support: 5 cmH2O
TCO2: 31.7 mmol/L (ref 0–100)
pH, Arterial: 7.42 (ref 7.350–7.450)
pO2, Arterial: 73.7 mmHg — ABNORMAL LOW (ref 80.0–100.0)

## 2015-02-14 LAB — CULTURE, BODY FLUID-BOTTLE

## 2015-02-14 LAB — MAGNESIUM: MAGNESIUM: 2 mg/dL (ref 1.7–2.4)

## 2015-02-14 LAB — PHOSPHORUS: PHOSPHORUS: 3.1 mg/dL (ref 2.5–4.6)

## 2015-02-14 LAB — APTT: aPTT: 30 seconds (ref 24–37)

## 2015-02-14 LAB — CALCIUM, IONIZED
CALCIUM, IONIZED, SERUM: 4.4 mg/dL — AB (ref 4.5–5.6)
Calcium, Ionized, Serum: 4.5 mg/dL (ref 4.5–5.6)

## 2015-02-14 LAB — CULTURE, BODY FLUID W GRAM STAIN -BOTTLE: Culture: NO GROWTH

## 2015-02-14 LAB — PREALBUMIN: PREALBUMIN: 10 mg/dL — AB (ref 18–38)

## 2015-02-14 MED ORDER — TRACE MINERALS CR-CU-MN-SE-ZN 10-1000-500-60 MCG/ML IV SOLN
INTRAVENOUS | Status: AC
  Administered 2015-02-14: 18:00:00 via INTRAVENOUS
  Filled 2015-02-14: qty 2400

## 2015-02-14 MED ORDER — INSULIN GLARGINE 100 UNIT/ML ~~LOC~~ SOLN
40.0000 [IU] | SUBCUTANEOUS | Status: DC
Start: 2015-02-14 — End: 2015-02-16
  Administered 2015-02-15: 40 [IU] via SUBCUTANEOUS
  Filled 2015-02-14 (×3): qty 0.4

## 2015-02-14 MED ORDER — INSULIN ASPART 100 UNIT/ML ~~LOC~~ SOLN
2.0000 [IU] | SUBCUTANEOUS | Status: DC
  Administered 2015-02-16 (×2): 2 [IU] via SUBCUTANEOUS

## 2015-02-14 MED ORDER — MIDAZOLAM HCL 2 MG/2ML IJ SOLN
2.0000 mg | INTRAMUSCULAR | Status: DC | PRN
  Administered 2015-02-14 (×3): 2 mg via INTRAVENOUS
  Filled 2015-02-14 (×3): qty 2

## 2015-02-14 MED ORDER — CHLORHEXIDINE GLUCONATE 0.12 % MT SOLN
15.0000 mL | Freq: Two times a day (BID) | OROMUCOSAL | Status: DC
  Administered 2015-02-14 – 2015-02-26 (×22): 15 mL via OROMUCOSAL
  Filled 2015-02-14 (×18): qty 15

## 2015-02-14 MED ORDER — CETYLPYRIDINIUM CHLORIDE 0.05 % MT LIQD
7.0000 mL | Freq: Two times a day (BID) | OROMUCOSAL | Status: DC
  Administered 2015-02-14 – 2015-02-26 (×23): 7 mL via OROMUCOSAL

## 2015-02-14 MED ORDER — FENTANYL CITRATE (PF) 100 MCG/2ML IJ SOLN
12.5000 ug | INTRAMUSCULAR | Status: DC | PRN
  Administered 2015-02-14: 12.5 ug via INTRAVENOUS
  Administered 2015-02-14: 25 ug via INTRAVENOUS
  Administered 2015-02-14: 12.5 ug via INTRAVENOUS
  Administered 2015-02-15 – 2015-02-16 (×6): 25 ug via INTRAVENOUS
  Administered 2015-02-17: 12.5 ug via INTRAVENOUS
  Administered 2015-02-20 – 2015-02-23 (×3): 25 ug via INTRAVENOUS
  Filled 2015-02-14 (×13): qty 2

## 2015-02-14 MED ORDER — HYDROCORTISONE NA SUCCINATE PF 100 MG IJ SOLR
50.0000 mg | Freq: Three times a day (TID) | INTRAMUSCULAR | Status: DC
  Administered 2015-02-14 – 2015-02-15 (×3): 50 mg via INTRAVENOUS
  Filled 2015-02-14 (×3): qty 2

## 2015-02-14 NOTE — Progress Notes (Signed)
[facility]    Date of Admission:  01/19/2015   Total days of antibiotics 26        Day 6 meropenem/ day5 anidulafungin   ID: Larry [name] is a 69 y.o. male with septic shock due to infected hematoma s/p IR drain, protein-caloric malnutrition and aki requiring HD Principal Problem:   Septic shock Active Problems:   Anticoagulated   Paroxysmal a-fib   Acute blood loss anemia   Catheter-associated urinary tract infection   Intra-abdominal infection   Candidiasis of urogenital site   Acute respiratory failure with hypoxia   Infected hematoma GB fossa of liver   Leukocytosis   Diabetes mellitus with renal complications   Anemia of chronic disease   Hypokalemia   Hypomagnesemia   Acute renal failure   Protein-calorie malnutrition, severe   AKI (acute kidney injury)   Encounter for palliative care   Acute encephalopathy    Subjective: Afebrile, extubated this morning. Slowly weaning off pressors. Remains confused but conversant and focuses on answering question, denies nausea, 1 small bowel movement  Medications:  . anidulafungin  100 mg Intravenous Q24H  . antiseptic oral rinse  7 mL Mouth Rinse q12n4p  . chlorhexidine  15 mL Mouth Rinse BID  . darbepoetin (ARANESP) injection - NON-DIALYSIS  200 mcg Subcutaneous Q Wed-1800  . hydrocortisone sodium succinate  50 mg Intravenous Q8H  . insulin aspart  2-6 Units Subcutaneous 6 times per day  . insulin glargine  40 Units Subcutaneous Q24H  . ipratropium  0.5 mg Nebulization Q6H  . levalbuterol  0.63 mg Nebulization Q6H  . meropenem (MERREM) IV  500 mg Intravenous Q6H  . nystatin   Topical TID  . pantoprazole (PROTONIX) IV  40 mg Intravenous Q24H  . sodium chloride  10-40 mL Intracatheter Q12H  . vancomycin  1,000 mg Intravenous Q24H    Objective: Vital signs in last 24 hours: Temp:  [97.6 F (36.4 C)-98.2 F (36.8 C)] 98.2 F (36.8 C) (08/29 1200) Pulse Rate:  [28-124] 28 (08/29  1600) Resp:  [11-36] 25 (08/29 1600) BP: (74-149)/(10-127) 108/49 mmHg (08/29 1600) SpO2:  [90 %-100 %] 94 % (08/29 1600) FiO2 (%):  [40 %] 40 % (08/29 0726) Weight:  [256 lb 9.9 oz (116.4 kg)] 256 lb 9.9 oz (116.4 kg) (08/29 0500)  Physical Exam  Constitutional: He appears chronically ill.alert, conversant HEENT= dry oral mucosa Neck: left HD cath IJ Cardiovascular: Normal rate, regular rhythm and normal heart sounds. Exam reveals no gallop and no friction rub.  No murmur heard.  Pulmonary/Chest: Effort normal and breath sounds normal. No respiratory distress. He has no wheezes.  Abdominal: mildly distended, decreased bowel sounds Ext: 2+ edema c/w anasarca Skin: Skin is warm and dry. No rash noted. No erythema.     Lab Results  Recent Labs  02/13/15 0415  02/13/15 1650  02/14/15 0530  02/14/15 1408 02/14/15 1414  WBC 23.8*  --   --   --  25.1*  --   --   --   HGB 8.7*  < >  --   < > 7.6*  < > 9.9* 9.5*  HCT 27.6*  < >  --   < > 23.8*  < > 29.0* 28.0*  NA 134*  134*  < > 135  < > 135  137  < > 136 133*  K 4.1  4.1  < > 4.0  < > 4.3  4.3  < > 4.1 4.1  CL 87*  86*  < > 90*  < > 94*  93*  < > 91* 91*  CO2 36*  36*  < > 35*  --  36*  36*  --   --   --   BUN 33*  34*  < > 41*  < > 38*  38*  < > 28* 35*  CREATININE 2.14*  2.23*  < > 2.32*  < > 2.06*  2.06*  < > 1.40* 2.00*  < > = values in this interval not displayed. Liver Panel  Recent Labs  02/13/15 0415 02/13/15 1650 02/14/15 0530  PROT 6.4*  --  6.5  ALBUMIN 1.9*  2.0* 2.0* 1.9*  1.9*  AST 36  --  55*  ALT 27  --  33  ALKPHOS 107  --  100  BILITOT 1.6*  --  2.0*    Studies/Results: Dg Chest Port 1 View  02/14/2015   CLINICAL DATA:  Respiratory acidosis, coronary artery disease, MI, intubated patient.  EXAM: PORTABLE CHEST - 1 VIEW  COMPARISON:  Portable chest x-ray of February 13, 2015  FINDINGS: The lungs remain hypoinflated. Elevation of the right hemidiaphragm is stable. The interstitial markings  are less conspicuous bilaterally. The cardiac silhouette remains enlarged. The pulmonary vascularity is less engorged.  The endotracheal tube tip lies 3.8 cm above the carina. The esophagogastric tube tip lies in the gastric cardia with the proximal port just distal to the GE junction the left internal jugular venous catheter tip projects over the proximal SVC. The PICC line tip projects over the junction of the middle and distal portions of the SVC. There are chronic changes associated with both shoulders.  IMPRESSION: Interval improvement in the pulmonary interstitium consistent with resolving interstitial edema. Persistent elevation of the right hemidiaphragm and likely right basilar atelectasis or pneumonia. Support tubes in reasonable position.   Electronically Signed   By: David  [place] M.D.   On: 02/14/2015 07:07   Dg Chest Port 1 View  02/13/2015   CLINICAL DATA:  Acute respiratory failure. Hypertension. Coronary artery disease. Diabetes. Myocardial infarction.  EXAM: PORTABLE CHEST - 1 VIEW  COMPARISON:  One day prior  FINDINGS: Endotracheal tube terminates 1.9 cm above carina. Left internal jugular line tip at high SVC. Right-sided PICC line terminates at mid to low SVC. Nasogastric terminates at the body of the stomach. Cardiomegaly accentuated by AP portable technique. Right hemidiaphragm elevation is moderate. Suspect a small right pleural effusion. No pneumothorax. Interstitial edema is mild and similar, given differences in inspiratory effort. Mild right base volume loss without lobar consolidation.  IMPRESSION: Given differences in inspiratory effort, similar mild interstitial edema.  Probable trace right pleural fluid.   Electronically Signed   By: Jeronimo Greaves M.D.   On: 02/13/2015 08:49     Assessment/Plan:  Intra-abdominal infection with infected hematoma post choelcystectomy =  Continue on meropenem and empiric antifungal. Undergoing tpa protocol of drain to see if accelerate drainage of  GB fossa hematoma/infection.  aki = continues on CRRT for now. Will renally dose meropenem  Leukocytosis = still trending up. Recently broadened to meropenem and anidulafungin. Repeat drain cultures and Blood cultures are negative. Will consider keeping anidulafungin but switch to piptazo  Secondary peritonitis = likely related to 9 X 9 cm hematoma/fluid collection. Cultures are negative  Severe protein-caloric malnutrition = continue with tpn for now but discussion for swallow eval vs. Post-pyloric dophoff  Hypotension = still pressor dependent, managed by PCCM  Drue Second [facility] Cell: [phone] Pager: [phone]  02/14/2015, 4:54 PM

## 2015-02-14 NOTE — Progress Notes (Signed)
[facility] ROUNDING NOTE   Subjective:   Interval History: no changes noted  Objective:  Vital signs in last 24 hours:  Temp:  [97.6 F (36.4 C)-98.4 F (36.9 C)] 97.8 F (36.6 C) (08/29 0800) Pulse Rate:  [76-123] 119 (08/29 1100) Resp:  [11-37] 30 (08/29 1100) BP: (49-149)/(26-127) 114/32 mmHg (08/29 1115) SpO2:  [90 %-100 %] 99 % (08/29 1100) FiO2 (%):  [40 %] 40 % (08/29 0726) Weight:  [116.4 kg (256 lb 9.9 oz)] 116.4 kg (256 lb 9.9 oz) (08/29 0500)  Weight change: -1 kg (-2 lb 3.3 oz) Filed Weights   02/12/15 0500 02/13/15 0500 02/14/15 0500  Weight: 117.3 kg (258 lb 9.6 oz) 117.4 kg (258 lb 13.1 oz) 116.4 kg (256 lb 9.9 oz)    Intake/Output: I/O last 3 completed shifts: In: 10710.6 [I.V.:5618.1; Other:180; NG/GT:100; IV Piggyback:980] Out: 86578 [Urine:95; Emesis/NG output:290; Other:9871]   Intake/Output this shift:  Total I/O In: 1392.1 [I.V.:662.1; Other:80; IV Piggyback:150; TPN:500] Out: 1419 [Urine:25; Emesis/NG output:50; Other:1344]  CVS- RRR RS- CTA ABD- BS present soft non-distended EXT- no edema   Basic Metabolic Panel:  Recent Labs Lab 02/10/15 0452 02/11/15 0600  02/12/15 0650  02/12/15 1800  02/13/15 0415  02/13/15 1230  02/13/15 1650  02/14/15 0530 02/14/15 0625 02/14/15 0637 02/14/15 0948 02/14/15 0953  NA 134* 131*  < > 131*  < > 132*  < > 134*  134*  < > 134*  < > 135  < > 135  137 137 134* 133* 137  K 4.0 4.2  < > 4.2  < > 4.0  < > 4.1  4.1  < > 4.4  < > 4.0  < > 4.3  4.3 4.2 4.4 4.3 4.1  CL 99* 100*  < > 94*  < > 92*  < > 87*  86*  < > 89*  < > 90*  < > 94*  93* 88* 90* 91* 90*  CO2 25 23  < > 30  --  29  --  36*  36*  --  37*  --  35*  --  36*  36*  --   --   --   --   GLUCOSE 152* 275*  < > 327*  < > 391*  < > 450*  445*  < > 407*  < > 339*  < > 150*  146* 203* 158* 213* 244*  BUN 30* 32*  < > 36*  < > 34*  < > 33*  34*  < > 40*  < > 41*  < > 38*  38* 30* 38* 36* 29*  CREATININE 2.98* 2.56*  < >  2.18*  < > 2.08*  < > 2.14*  2.23*  < > 2.50*  < > 2.32*  < > 2.06*  2.06* 1.50* 2.10* 1.90* 1.40*  CALCIUM 8.3* 8.0*  < > 8.8*  --  9.1  --  9.6  9.5  --  9.1  --  9.4  --  8.1*  8.2*  --   --   --   --   MG 2.2 2.2  --  2.2  --   --   --  2.0  --   --   --   --   --  2.0  --   --   --   --   PHOS 2.2* 2.5  < > 3.1  3.1  --  2.6  --  3.1  3.1  --   --   --  3.1  --  3.1  3.1  --   --   --   --   < > = values in this interval not displayed.  Liver Function Tests:  Recent Labs Lab 02/08/15 0540  02/10/15 0452  02/12/15 0650 02/12/15 1800 02/13/15 0415 02/13/15 1650 02/14/15 0530  AST 58*  --  78*  --   --   --  36  --  55*  ALT 19  --  37  --   --   --  27  --  33  ALKPHOS 131*  --  138*  --   --   --  107  --  100  BILITOT 1.0  --  1.3*  --   --   --  1.6*  --  2.0*  PROT 6.6  --  7.3  --   --   --  6.4*  --  6.5  ALBUMIN 2.1*  < > 2.3*  < > 1.9* 1.8* 1.9*  2.0* 2.0* 1.9*  1.9*  < > = values in this interval not displayed. No results for input(s): LIPASE, AMYLASE in the last 168 hours. No results for input(s): AMMONIA in the last 168 hours.  CBC:  Recent Labs Lab 02/10/15 0452 02/11/15 0600  02/12/15 0650  02/13/15 0415  02/14/15 0530 02/14/15 0625 02/14/15 0637 02/14/15 0948 02/14/15 0953  WBC 20.1* 22.3*  --  21.6*  --  23.8*  --  25.1*  --   --   --   --   NEUTROABS  --  20.1*  --   --   --  18.7*  --  20.5*  --   --   --   --   HGB 7.9* 8.5*  < > 8.0*  < > 8.7*  < > 7.6* 10.2* 9.2* 9.2* 9.9*  HCT 24.5* 26.2*  < > 26.2*  < > 27.6*  < > 23.8* 30.0* 27.0* 27.0* 29.0*  MCV 89.4 91.9  --  90.7  --  92.6  --  94.4  --   --   --   --   PLT 367 413*  --  250  --  160  --  156  --   --   --   --   < > = values in this interval not displayed.  Cardiac Enzymes: No results for input(s): CKTOTAL, CKMB, CKMBINDEX, TROPONINI in the last 168 hours.  BNP: Invalid input(s): POCBNP  CBG:  Recent Labs Lab 02/14/15 0348 02/14/15 0454 02/14/15 0552 02/14/15 0649  02/14/15 0743  GLUCAP 144* 140* 139* 139* 136*    Microbiology: Results for orders placed or performed during the hospital encounter of 01/19/15  Culture, routine-abscess     Status: None   Collection Time: 01/20/15  1:00 PM  Result Value Ref Range Status   Specimen Description ABSCESS GALL BLADDER FOSSA  Final   Special Requests Normal  Final   Gram Stain   Final    ABUNDANT WBC PRESENT,BOTH PMN AND MONONUCLEAR NO SQUAMOUS EPITHELIAL CELLS SEEN NO ORGANISMS SEEN Performed at [facility]    Culture   Final    MULTIPLE ORGANISMS PRESENT, NONE PREDOMINANT Note: NO STAPHYLOCOCCUS AUREUS ISOLATED NO GROUP A STREP (S.PYOGENES) ISOLATED Performed at [facility]    Report Status 01/23/2015 FINAL  Final  Anaerobic culture     Status: None   Collection Time: 01/20/15  1:00 PM  Result Value Ref Range Status   Specimen Description ABSCESS GALL  BLADDER FOSSA  Final   Special Requests Normal  Final   Gram Stain   Final    ABUNDANT WBC PRESENT,BOTH PMN AND MONONUCLEAR NO SQUAMOUS EPITHELIAL CELLS SEEN NO ORGANISMS SEEN Performed at [facility]    Culture   Final    NO ANAEROBES ISOLATED Performed at [facility]    Report Status 01/26/2015 FINAL  Final  MRSA PCR Screening     Status: None   Collection Time: 01/20/15  1:39 PM  Result Value Ref Range Status   MRSA by PCR NEGATIVE NEGATIVE Final    Comment:        The GeneXpert MRSA Assay (FDA approved for NASAL specimens only), is one component of a comprehensive MRSA colonization surveillance program. It is not intended to diagnose MRSA infection nor to guide or monitor treatment for MRSA infections. Performed at [facility]   Culture, blood (routine x 2)     Status: None   Collection Time: 01/20/15  1:50 PM  Result Value Ref Range Status   Specimen Description BLOOD RIGHT ARM  Final   Special Requests IN PEDIATRIC BOTTLE 4CC  Final   Culture   Final    NO GROWTH 5  DAYS Performed at [facility]    Report Status 01/25/2015 FINAL  Final  Culture, blood (routine x 2)     Status: None   Collection Time: 01/20/15  1:55 PM  Result Value Ref Range Status   Specimen Description BLOOD RIGHT HAND  Final   Special Requests IN PEDIATRIC BOTTLE 4CC  Final   Culture   Final    NO GROWTH 5 DAYS Performed at [facility]    Report Status 01/25/2015 FINAL  Final  Urine culture     Status: None   Collection Time: 01/30/15 11:40 AM  Result Value Ref Range Status   Specimen Description URINE, RANDOM  Final   Special Requests NONE  Final   Culture   Final    >=100,000 COLONIES/mL YEAST Performed at [facility]    Report Status 02/01/2015 FINAL  Final  Culture, body fluid-bottle     Status: None (Preliminary result)   Collection Time: 02/09/15  2:10 PM  Result Value Ref Range Status   Specimen Description FLUID PERITONEAL  Final   Special Requests BOTTLES DRAWN AEROBIC AND ANAEROBIC 10CC  Final   Culture   Final    NO GROWTH 4 DAYS Performed at [facility]    Report Status PENDING  Incomplete  Gram stain     Status: None   Collection Time: 02/09/15  2:10 PM  Result Value Ref Range Status   Specimen Description FLUID PERITONEAL  Final   Special Requests BOTTLES DRAWN AEROBIC AND ANAEROBIC 10CC  Final   Gram Stain   Final    ABUNDANT WBC PRESENT,BOTH PMN AND MONONUCLEAR NO ORGANISMS SEEN Performed at [facility]    Report Status 02/09/2015 FINAL  Final  Culture, blood (routine x 2)     Status: None (Preliminary result)   Collection Time: 02/10/15  4:00 PM  Result Value Ref Range Status   Specimen Description BLOOD LEFT ANTECUBITAL  Final   Special Requests BOTTLES DRAWN AEROBIC ONLY 7CC  Final   Culture   Final    NO GROWTH 3 DAYS Performed at [facility]    Report Status PENDING  Incomplete  Culture, blood (routine x 2)     Status: None (Preliminary result)   Collection  Time: 02/10/15  4:15 PM   Result Value Ref Range Status   Specimen Description BLOOD LEFT FOREARM  Final   Special Requests BOTTLES DRAWN AEROBIC ONLY 5CC  Final   Culture   Final    NO GROWTH 3 DAYS Performed at [facility]    Report Status PENDING  Incomplete    Coagulation Studies: No results for input(s): LABPROT, INR in the last 72 hours.  Urinalysis: No results for input(s): COLORURINE, LABSPEC, PHURINE, GLUCOSEU, HGBUR, BILIRUBINUR, KETONESUR, PROTEINUR, UROBILINOGEN, NITRITE, LEUKOCYTESUR in the last 72 hours.  Invalid input(s): APPERANCEUR    Imaging: Dg Chest Port 1 View  02/14/2015   CLINICAL DATA:  Respiratory acidosis, coronary artery disease, MI, intubated patient.  EXAM: PORTABLE CHEST - 1 VIEW  COMPARISON:  Portable chest x-ray of February 13, 2015  FINDINGS: The lungs remain hypoinflated. Elevation of the right hemidiaphragm is stable. The interstitial markings are less conspicuous bilaterally. The cardiac silhouette remains enlarged. The pulmonary vascularity is less engorged.  The endotracheal tube tip lies 3.8 cm above the carina. The esophagogastric tube tip lies in the gastric cardia with the proximal port just distal to the GE junction the left internal jugular venous catheter tip projects over the proximal SVC. The PICC line tip projects over the junction of the middle and distal portions of the SVC. There are chronic changes associated with both shoulders.  IMPRESSION: Interval improvement in the pulmonary interstitium consistent with resolving interstitial edema. Persistent elevation of the right hemidiaphragm and likely right basilar atelectasis or pneumonia. Support tubes in reasonable position.   Electronically Signed   By: David  Swaziland M.D.   On: 02/14/2015 07:07   Dg Chest Port 1 View  02/13/2015   CLINICAL DATA:  Acute respiratory failure. Hypertension. Coronary artery disease. Diabetes. Myocardial infarction.  EXAM: PORTABLE CHEST - 1 VIEW  COMPARISON:  One day prior   FINDINGS: Endotracheal tube terminates 1.9 cm above carina. Left internal jugular line tip at high SVC. Right-sided PICC line terminates at mid to low SVC. Nasogastric terminates at the body of the stomach. Cardiomegaly accentuated by AP portable technique. Right hemidiaphragm elevation is moderate. Suspect a small right pleural effusion. No pneumothorax. Interstitial edema is mild and similar, given differences in inspiratory effort. Mild right base volume loss without lobar consolidation.  IMPRESSION: Given differences in inspiratory effort, similar mild interstitial edema.  Probable trace right pleural fluid.   Electronically Signed   By: Jeronimo Greaves M.D.   On: 02/13/2015 08:49   Dg Abd Portable 1v  02/12/2015   CLINICAL DATA:  OG tube placement  EXAM: PORTABLE ABDOMEN - 1 VIEW  COMPARISON:  Portable exam 0958 hours compared to CT abdomen and pelvis of 02/07/2015  FINDINGS: Tip of orogastric tube is at the gastroesophageal junction, recommend advancing tube into stomach.  Percutaneous pigtail drainage catheter RIGHT upper quadrant, by prior CT within gallbladder fossa collection.  Bowel gas pattern is suboptimally visualized due to technique and rotation.  Bones demineralized.  IMPRESSION: Tip of orogastric tube is at gastroesophageal junction, recommend advancing tube into stomach.   Electronically Signed   By: Ulyses Southward M.D.   On: 02/12/2015 14:09     Medications:   . Marland KitchenTPN (CLINIMIX-E) Adult 100 mL/hr at 02/13/15 1709  . Marland KitchenTPN (CLINIMIX-E) Adult    . sodium chloride 500 mL (02/14/15 0253)  . calcium gluconate infusion for CRRT 20 g (02/14/15 0342)  . insulin (NOVOLIN-R) infusion 12 Units/hr (02/14/15 1153)  . phenylephrine (NEO-SYNEPHRINE) Adult  infusion 110 mcg/min (02/14/15 1106)  . dialysate (PRISMASATE) 1,500 mL/hr at 02/14/15 0909  . dialysis replacement fluid (prismasate) 300 mL/hr at 02/14/15 0326  . sodium citrate 2 %/dextrose 2.5% solution 3000 mL 340 mL/hr at 02/14/15 0530  .  vasopressin (PITRESSIN) infusion - *FOR SHOCK* 0.03 Units/min (02/14/15 0736)   . anidulafungin  100 mg Intravenous Q24H  . antiseptic oral rinse  7 mL Mouth Rinse QID  . chlorhexidine gluconate  15 mL Mouth Rinse BID  . darbepoetin (ARANESP) injection - NON-DIALYSIS  200 mcg Subcutaneous Q Wed-1800  . hydrocortisone sodium succinate  50 mg Intravenous Q8H  . insulin aspart  2-6 Units Subcutaneous 6 times per day  . insulin glargine  40 Units Subcutaneous Q24H  . ipratropium  0.5 mg Nebulization Q6H  . levalbuterol  0.63 mg Nebulization Q6H  . meropenem (MERREM) IV  500 mg Intravenous Q6H  . nystatin   Topical TID  . pantoprazole (PROTONIX) IV  40 mg Intravenous Q24H  . sodium chloride  10-40 mL Intracatheter Q12H  . vancomycin  1,000 mg Intravenous Q24H   acetaminophen, fentaNYL (SUBLIMAZE) injection, heparin, heparin, iohexol, ipratropium, levalbuterol, midazolam, [DISCONTINUED] ondansetron **OR** ondansetron (ZOFRAN) IV, sodium chloride  Assessment/ Plan:   AKI prolonged ATN on CRRT  No changes to CRRT prescription  Electrolytes stable  Monitor and replete calcium  Anemia stable  Acid base controlled   LOS: 26 Larry [name] W @TODAY @12 :04 PM

## 2015-02-14 NOTE — Progress Notes (Signed)
Date:  February 14, 2015 U.R. performed for needs and level of care. 1.) EXTUBATED [id number] 2.) ON PRESSORS 3.) IV INSULIN 4.) CRRT Will continue to follow for Case Management needs.  Marcelle Smiling, RN, BSN, [place]   [phone]

## 2015-02-14 NOTE — Progress Notes (Signed)
Dr. Isaiah Serge called regarding blood sugars. Pt has orders to transition but is requiring increased amount of insulin to keep sugars low. MD said to leave patient on insulin drip for the night and let rounding MD evaluate in the morning.

## 2015-02-14 NOTE — Progress Notes (Signed)
Order for swallow evaluation received, will conduct 8/30 in am.  Thanks. Donavan Burnet, MS [facility] SLP [phone]

## 2015-02-14 NOTE — Progress Notes (Signed)
Subjective: Extubated. Still on CRRT. Now on 2nd vasopressor. C/o of L sided abd pain. No n/v.  Objective: Vital signs in last 24 hours: Temp:  [97.6 F (36.4 C)-98.4 F (36.9 C)] 98.2 F (36.8 C) (08/29 1200) Pulse Rate:  [76-124] 119 (08/29 1400) Resp:  [11-37] 31 (08/29 1500) BP: (49-149)/(10-127) 106/83 mmHg (08/29 1500) SpO2:  [90 %-100 %] 96 % (08/29 1408) FiO2 (%):  [40 %] 40 % (08/29 0726) Weight:  [116.4 kg (256 lb 9.9 oz)] 116.4 kg (256 lb 9.9 oz) (08/29 0500) Last BM Date: 02/12/15  Intake/Output from previous day: 08/28 0701 - 08/29 0700 In: 7247.4 [I.V.:3634.9; NG/GT:60; IV Piggyback:880; TPN:2512.5] Out: 7348 [Urine:80; Emesis/NG output:240] Intake/Output this shift: Total I/O In: 2161.7 [I.V.:1001.7; Other:110; IV Piggyback:250; TPN:800] Out: 2287 [Urine:52; Emesis/NG output:50; Other:2185]  Follows commands. More appropriate than late last week Obese, soft, some TTP on left. Drain - old blood  Lab Results:   Recent Labs  02/13/15 0415  02/14/15 0530  02/14/15 1408 02/14/15 1414  WBC 23.8*  --  25.1*  --   --   --   HGB 8.7*  < > 7.6*  < > 9.9* 9.5*  HCT 27.6*  < > 23.8*  < > 29.0* 28.0*  PLT 160  --  156  --   --   --   < > = values in this interval not displayed. BMET  Recent Labs  02/13/15 1650  02/14/15 0530  02/14/15 1408 02/14/15 1414  NA 135  < > 135  137  < > 136 133*  K 4.0  < > 4.3  4.3  < > 4.1 4.1  CL 90*  < > 94*  93*  < > 91* 91*  CO2 35*  --  36*  36*  --   --   --   GLUCOSE 339*  < > 150*  146*  < > 220* 181*  BUN 41*  < > 38*  38*  < > 28* 35*  CREATININE 2.32*  < > 2.06*  2.06*  < > 1.40* 2.00*  CALCIUM 9.4  --  8.1*  8.2*  --   --   --   < > = values in this interval not displayed. PT/INR No results for input(s): LABPROT, INR in the last 72 hours. ABG  Recent Labs  02/13/15 1300 02/14/15 0842  PHART 7.384 7.420  HCO3 32.9* 33.3*    Studies/Results: Dg Chest Port 1 View  02/14/2015   CLINICAL DATA:   Respiratory acidosis, coronary artery disease, MI, intubated patient.  EXAM: PORTABLE CHEST - 1 VIEW  COMPARISON:  Portable chest x-ray of February 13, 2015  FINDINGS: The lungs remain hypoinflated. Elevation of the right hemidiaphragm is stable. The interstitial markings are less conspicuous bilaterally. The cardiac silhouette remains enlarged. The pulmonary vascularity is less engorged.  The endotracheal tube tip lies 3.8 cm above the carina. The esophagogastric tube tip lies in the gastric cardia with the proximal port just distal to the GE junction the left internal jugular venous catheter tip projects over the proximal SVC. The PICC line tip projects over the junction of the middle and distal portions of the SVC. There are chronic changes associated with both shoulders.  IMPRESSION: Interval improvement in the pulmonary interstitium consistent with resolving interstitial edema. Persistent elevation of the right hemidiaphragm and likely right basilar atelectasis or pneumonia. Support tubes in reasonable position.   Electronically Signed   By: David  Swaziland M.D.   On: 02/14/2015  07:07   Dg Chest Port 1 View  02/13/2015   CLINICAL DATA:  Acute respiratory failure. Hypertension. Coronary artery disease. Diabetes. Myocardial infarction.  EXAM: PORTABLE CHEST - 1 VIEW  COMPARISON:  One day prior  FINDINGS: Endotracheal tube terminates 1.9 cm above carina. Left internal jugular line tip at high SVC. Right-sided PICC line terminates at mid to low SVC. Nasogastric terminates at the body of the stomach. Cardiomegaly accentuated by AP portable technique. Right hemidiaphragm elevation is moderate. Suspect a small right pleural effusion. No pneumothorax. Interstitial edema is mild and similar, given differences in inspiratory effort. Mild right base volume loss without lobar consolidation.  IMPRESSION: Given differences in inspiratory effort, similar mild interstitial edema.  Probable trace right pleural fluid.    Electronically Signed   By: Jeronimo Greaves M.D.   On: 02/13/2015 08:49    Anti-infectives: Anti-infectives    Start     Dose/Rate Route Frequency Ordered Stop   02/14/15 1200  vancomycin (VANCOCIN) IVPB 1000 mg/200 mL premix     1,000 mg 200 mL/hr over 60 Minutes Intravenous Every 24 hours 02/13/15 0920     02/13/15 1000  vancomycin (VANCOCIN) 1,750 mg in sodium chloride 0.9 % 500 mL IVPB     1,750 mg 250 mL/hr over 120 Minutes Intravenous  Once 02/13/15 0836 02/13/15 1210   02/12/15 1630  meropenem (MERREM) 500 mg in sodium chloride 0.9 % 50 mL IVPB     500 mg 100 mL/hr over 30 Minutes Intravenous Every 6 hours 02/12/15 1106     02/11/15 1800  anidulafungin (ERAXIS) 100 mg in sodium chloride 0.9 % 100 mL IVPB     100 mg over 90 Minutes Intravenous Every 24 hours 02/10/15 1744     02/10/15 1730  anidulafungin (ERAXIS) 200 mg in sodium chloride 0.9 % 200 mL IVPB     200 mg over 180 Minutes Intravenous  Once 02/10/15 1720 02/10/15 2230   02/10/15 0200  meropenem (MERREM) 500 mg in sodium chloride 0.9 % 50 mL IVPB  Status:  Discontinued     500 mg 100 mL/hr over 30 Minutes Intravenous Every 8 hours 02/09/15 1808 02/12/15 1106   02/10/15 0000  meropenem (MERREM) 500 mg in sodium chloride 0.9 % 50 mL IVPB  Status:  Discontinued     500 mg 100 mL/hr over 30 Minutes Intravenous 4 times per day 02/09/15 1655 02/09/15 1808   02/09/15 1800  meropenem (MERREM) 1 g in sodium chloride 0.9 % 100 mL IVPB     1 g 200 mL/hr over 30 Minutes Intravenous  Once 02/09/15 1655 02/09/15 1818   02/08/15 1800  piperacillin-tazobactam (ZOSYN) IVPB 3.375 g  Status:  Discontinued     3.375 g 100 mL/hr over 30 Minutes Intravenous 4 times per day 02/08/15 1403 02/09/15 1655   02/07/15 1400  piperacillin-tazobactam (ZOSYN) IVPB 2.25 g  Status:  Discontinued     2.25 g 100 mL/hr over 30 Minutes Intravenous 3 times per day 02/07/15 0750 02/08/15 1403   02/04/15 1200  piperacillin-tazobactam (ZOSYN) IVPB 2.25 g   Status:  Discontinued     2.25 g 100 mL/hr over 30 Minutes Intravenous 4 times per day 02/04/15 0859 02/04/15 0905   02/04/15 1200  piperacillin-tazobactam (ZOSYN) IVPB 3.375 g  Status:  Discontinued     3.375 g 12.5 mL/hr over 240 Minutes Intravenous Every 8 hours 02/04/15 0905 02/07/15 0750   02/02/15 2000  fluconazole (DIFLUCAN) tablet 100 mg     100 mg Oral  Every 24 hours 02/02/15 1908 02/08/15 2041   02/02/15 1800  fluconazole (DIFLUCAN) tablet 100 mg  Status:  Discontinued     100 mg Oral Daily 02/02/15 1545 02/02/15 1901   01/31/15 1000  fluconazole (DIFLUCAN) tablet 100 mg  Status:  Discontinued     100 mg Oral Daily 01/31/15 0727 02/02/15 1313   01/29/15 1700  piperacillin-tazobactam (ZOSYN) IVPB 2.25 g  Status:  Discontinued     2.25 g 100 mL/hr over 30 Minutes Intravenous Every 8 hours 01/29/15 0845 02/04/15 0859   01/24/15 2200  vancomycin (VANCOCIN) IVPB 1000 mg/200 mL premix     1,000 mg 200 mL/hr over 60 Minutes Intravenous  Once 01/24/15 1349 01/24/15 2301   01/21/15 2000  vancomycin (VANCOCIN) 1,500 mg in sodium chloride 0.9 % 500 mL IVPB  Status:  Discontinued     1,500 mg 250 mL/hr over 120 Minutes Intravenous Every 24 hours 01/21/15 1048 01/23/15 1726   01/20/15 2000  vancomycin (VANCOCIN) 1,250 mg in sodium chloride 0.9 % 250 mL IVPB  Status:  Discontinued     1,250 mg 166.7 mL/hr over 90 Minutes Intravenous Every 24 hours 01/19/15 1945 01/20/15 0843   01/20/15 2000  vancomycin (VANCOCIN) 1,250 mg in sodium chloride 0.9 % 250 mL IVPB  Status:  Discontinued     1,250 mg 166.7 mL/hr over 90 Minutes Intravenous Every 24 hours 01/20/15 1519 01/21/15 1048   01/20/15 0000  piperacillin-tazobactam (ZOSYN) IVPB 3.375 g  Status:  Discontinued     3.375 g 12.5 mL/hr over 240 Minutes Intravenous Every 8 hours 01/19/15 1946 01/29/15 0845   01/19/15 1715  vancomycin (VANCOCIN) 2,500 mg in sodium chloride 0.9 % 500 mL IVPB     2,500 mg 250 mL/hr over 120 Minutes Intravenous   Once 01/19/15 1707 01/19/15 2159   01/19/15 1715  piperacillin-tazobactam (ZOSYN) IVPB 3.375 g     3.375 g 100 mL/hr over 30 Minutes Intravenous  Once 01/19/15 1707 01/19/15 1900      Assessment/Plan: Ok with swallow evaluation and PO as tolerated; however, doubt pt will take enough by mouth; also issue of ileus last week. Ok with enteral feed trial via panda/NG.  Will need subcu dvt prophylaxis Blood cx still negative. Repeat cultures from GB fossa hematoma still negative except for wbc. Not sure what is driving need for ongoing vasopressors.  Wean vasopressors as tolerated pulm toilet, IS/flutter Discussed with CCM  [name]. [name], MD, FACS General, Bariatric, & Minimally Invasive Surgery [facility], [place]   LOS: 26 days    [name] 02/14/2015

## 2015-02-14 NOTE — Care Management Important Message (Signed)
Important Message  Patient Details  Name: Larry [name] MRN: [id number] Date of Birth: 02/17/1946   Medicare Important Message Given:  Yes-third notification given    Haskell Flirt 02/14/2015, 10:49 AMImportant Message  Patient Details  Name: Larry [name] MRN: [id number] Date of Birth: 1945-11-15   Medicare Important Message Given:  Yes-third notification given    Haskell Flirt 02/14/2015, 10:49 AM

## 2015-02-14 NOTE — Progress Notes (Addendum)
[place] NOTE   Pharmacy Consult for TPN Indication: Prolonged ileus  No Known Allergies  Patient Measurements: Height: 5\' 6"  (167.6 cm) Weight: 256 lb 9.9 oz (116.4 kg) IBW/kg (Calculated) : 63.8 Adjusted Body Weight: 78kg Usual Weight:   Vital Signs: Temp: 97.8 F (36.6 C) (08/29 0800) Temp Source: Axillary (08/29 0800) BP: 110/38 mmHg (08/29 0800) Pulse Rate: 113 (08/29 0800) Intake/Output from previous day: 08/28 0701 - 08/29 0700 In: 7247.4 [I.V.:3634.9; NG/GT:60; IV Piggyback:880; TPN:2512.5] Out: 7348 [Urine:80; Emesis/NG output:240] Intake/Output from this shift: Total I/O In: 284.6 [I.V.:144.6; Other:40; TPN:100] Out: 284 [Urine:3; Other:281]  Labs:  Recent Labs  02/12/15 0650  02/13/15 0415  02/14/15 0530 02/14/15 0625 02/14/15 0637  WBC 21.6*  --  23.8*  --  25.1*  --   --   HGB 8.0*  < > 8.7*  < > 7.6* 10.2* 9.2*  HCT 26.2*  < > 27.6*  < > 23.8* 30.0* 27.0*  PLT 250  --  160  --  156  --   --   APTT 34  --  30  --  30  --   --   < > = values in this interval not displayed.   Recent Labs  02/12/15 0650  02/13/15 0415  02/13/15 1230  02/13/15 1650  02/14/15 0500 02/14/15 0530 02/14/15 0625 02/14/15 0637  NA 131*  < > 134*  134*  < > 134*  < > 135  < >  --  135  137 137 134*  K 4.2  < > 4.1  4.1  < > 4.4  < > 4.0  < >  --  4.3  4.3 4.2 4.4  CL 94*  < > 87*  86*  < > 89*  < > 90*  < >  --  94*  93* 88* 90*  CO2 30  < > 36*  36*  --  37*  --  35*  --   --  36*  36*  --   --   GLUCOSE 327*  < > 450*  445*  < > 407*  < > 339*  < >  --  150*  146* 203* 158*  BUN 36*  < > 33*  34*  < > 40*  < > 41*  < >  --  38*  38* 30* 38*  CREATININE 2.18*  < > 2.14*  2.23*  < > 2.50*  < > 2.32*  < >  --  2.06*  2.06* 1.50* 2.10*  CALCIUM 8.8*  < > 9.6  9.5  --  9.1  --  9.4  --   --  8.1*  8.2*  --   --   MG 2.2  --  2.0  --   --   --   --   --   --  2.0  --   --   PHOS 3.1  3.1  < > 3.1  3.1  --   --   --  3.1  --   --  3.1   3.1  --   --   PROT  --   --  6.4*  --   --   --   --   --   --  6.5  --   --   ALBUMIN 1.9*  < > 1.9*  2.0*  --   --   --  2.0*  --   --  1.9*  1.9*  --   --  AST  --   --  36  --   --   --   --   --   --  55*  --   --   ALT  --   --  27  --   --   --   --   --   --  33  --   --   ALKPHOS  --   --  107  --   --   --   --   --   --  100  --   --   BILITOT  --   --  1.6*  --   --   --   --   --   --  2.0*  --   --   PREALBUMIN  --   --   --   --   --   --   --   --  10.0*  --   --   --   TRIG  --   --   --   --   --   --   --   --  175*  --   --   --   < > = values in this interval not displayed. Estimated Creatinine Clearance: 40.4 mL/min (by C-G formula based on Cr of 2.1).    Recent Labs  02/14/15 0552 02/14/15 0649 02/14/15 0743  GLUCAP 139* 139* 136*    Insulin Requirements:  Insulin gtt started 8/28 AM - stress dose hydrocortisone started 8/27 - calcium gluconate and phenylephrine drips mixed in dextrose  Current Nutrition: NPO on vent, unable to tolerate TF  IVF: NS KVO  Central access: PICC  TPN start date:  8/22  ASSESSMENT                                                                                                          HPI: 30 YOM presents with worsening abdominal pain on 8/3.  Recent cholecystectomy on 01/04/2015 with drain removed 2 weeks prior to admission. Found to have GB fossa fluid collection (infected hematoma) and abscess. IR placed GB fossa drain.  Pharmacy asked to start TPN 8/22 for prolonged ileus and interment vomiting prohibiting use of enteral nutrition  Significant events:  8/3 septic shock from infected GB fossa 8/22 orders to start TPN 8/23 Start CRRT 8/25 intubated 8/29 extubated  Today, 02/14/2015:    Glucose - controlled on insulin infusion for past 24 hours. CCM ordered Lantus 40 units this AM and to resume Novolog SSI to come off drip.  Electrolytes:  Slightly HypoNa (134)  K+ WNL  Mg/Phos WNL  Corr Ca 9.04, Ionized  Calcium 1.06 on continuous CaGluc infusion. Received Calcium chloride 1g IV yesterday as well.  Renal - oliguric AKI. CRRT 8/23  24h I/O = -100 ml,   SCr overall improving (2.10)  LFTs - AST/ALT, Alk Phos ok, tbili slightly elevated  TGs - 155 (8/23), 175 (8/29)  Prealbumin - 5.3 (8/22), 10 (8/29)  NUTRITIONAL GOALS  Updated RD recs with CRRT and ventilator 8/28: 2089 kcals, protein 150-170 g (fluid 1.5L/day) Clinimix 5/15 at a goal rate of 121ml/hr + 20% fat emulsion at 67ml/hr to provide: 132g/day protein, 2354Kcal/day.  Will be unable to meet current goal protein needs with premixed Clinimix formulation   PLAN                                                                                                              At 1800 today:  Continue Clinimix 5/15 (WITH electrolytes) at 140ml/hr - this is not providing 100% protein needs but do not want to worsen fluid overload or hyperglycemia - as discussed with Dr. Titus Mould 8/28.    Since plan is to discontinue the insulin drip this morning, will add regular insulin back into the TPN to continue to control the hypergylcemia. Would prefer to add insulin to TPN rather than increase Lantus dose in case TPN is abruptly stopped.  CCM beginning taper of stress dose steroids today.  Add regular insulin to TPN so that patient receives 80 units of regular insulin over 24 hour infusion.  Hold lipid emulsions as changed to ICU status as of 8/23 (hold lipids for the first week of ICU admission).  Will resume lipids tomorrow 8/30.  TPN to contain standard multivitamins and trace elements.  Daily renal function panel, Mg and Phos ordered per MD   F/u daily.  F/u surgery consult recommendations in regards to opportunities for enteral feeding  Hershal Coria, PharmD, BCPS Pager: [phone] 02/14/2015 9:16 AM

## 2015-02-14 NOTE — Progress Notes (Signed)
PULMONARY / CRITICAL CARE MEDICINE   Name: Larry [name] MRN: [id number] DOB: [birth year]    ADMISSION DATE:  01/19/2015 CONSULTATION DATE:  8/4  REFERRING MD :  [name]  CHIEF COMPLAINT:  Septic shock   INITIAL PRESENTATION:  69 year old male w/ CAF on Elliquis who recently underwent lap chole on 7/14. Was discharged home w/drain. Drain removed about a week before presentation to ER. Admitted directly from [facility] on 8/3 w/ septic shock which was likely due to infected hematoma in the GB fossa. PCCM asked to see after new perc drain placed on 8/4, when he remained hypotensive.  Required CRRT 8/23 for AKI  Intubated 8/26 for desaturations, significant agitation, delirium  STUDIES:  CT abdomen/Pelvis W/ 7/28 - Gas & fluid at cholecystectomy site w/o discrete abscess. Min ascites. Small right pleural eff. CT abdomen/pelvis w/o 8/5 - Gas between liver & stomach. No additional free air. Increased flow void & high density layering c/w increasing hemorrhage as well as increased right pleural eff. CT abdomen/pelvis 8/18 >> 9.8 cm hematoma with gas in the gallbladder fossa, indwelling pigtail drainage catheter, 12.6 cm hematoma along the posterior R liver, mod ascites, mild wall thickening involving the R colon CT abdomen/pelvis 8/22 >> stable perihepatic hematoma, stable gallbladder fossa collection despite adequate drainage cath placement, significant free fluid within the abdomen/pelvis slightly increased from prior exam  SIGNIFICANT EVENTS: Admitted 8/3 w/ abd pain and hypotension  8/04  hypotensive over-night. Went for [facility] drain of GB fossa. Found what looked like old infected hematoma. PCCM asked to see post-op for shock 8/05  off pressors. WOB worse.  8/06  WOB & wheezing slightly worse. Progressing abdominal pain 8/07  Hgb declining w/ increasing intraperitoneal hemorrhage 8/07  Transfused 2u PRBCs 8/09  work of breathing still significant w/ marked upper airway component   8/10  breathing better. 1.6 liters negative. Slight bump in scr. Vanc stopped. Getting OOB for first time 8/11  eating regular diet. Up in chair. Feeling better.  Respiratory status improved.  PCCM s/o 8/14  Renal consulted for rising sr cr (to 3.60), baseline sr cr 1.1-1.5 (prior IV contrast 7/28).   8/14  Concern for yeast / bacterial UTI  8/15  ID consulted > abx narrowed to zosyn only, fluconazole for yeast  8/16  UOP increased on IV lasix 8/18  NPO, sr cr improving, CT abd as above  8/18  GB drain upsized per IR to 16 Fr 8/19  Abd pain, eating well.  Diarrhea after ensure.  To PO lasix.  Discussed gastrostomy tube with IR 8/22  Sr Cr rising, intermittent vomiting.  CT abd as above.  Increased confusion 8/23  HD cath inserted, CRRT initiated.  Gastrostomy tube held off due to CRRT & Hgb drop 8/24  WBC elevated, cultures repeated.   8/24  Paracentesis >> 150 ml "bloody bile" fluid removed, loculated and difficult to aspirate 8/25  Palliative care consulted, PCCM consulted.  Vomiting with NGT insertion.  8/26  Intubated early am(0200) for desaturations, significant agitation, delirium   SUBJECTIVE:   Afebrile Denies pain Remains critically ill, on pressors & CRRT  VITAL SIGNS: Temp:  [97.6 F (36.4 C)-99 F (37.2 C)] 97.8 F (36.6 C) (08/29 0800) Pulse Rate:  [76-114] 113 (08/29 0800) Resp:  [11-37] 19 (08/29 0800) BP: (49-141)/(26-71) 110/38 mmHg (08/29 0800) SpO2:  [90 %-100 %] 99 % (08/29 0800) FiO2 (%):  [40 %] 40 % (08/29 0726) Weight:  [256 lb 9.9 oz (116.4  kg)] 256 lb 9.9 oz (116.4 kg) (08/29 0500)  HEMODYNAMICS: CVP:  [8 mmHg-14 mmHg] 14 mmHg   VENTILATOR SETTINGS: Vent Mode:  [-] PRVC FiO2 (%):  [40 %] 40 % Set Rate:  [12 bmp] 12 bmp Vt Set:  [510 mL] 510 mL PEEP:  [5 cmH20] 5 cmH20 Pressure Support:  [5 cmH20] 5 cmH20 Plateau Pressure:  [17 cmH20-25 cmH20] 25 cmH20   INTAKE / OUTPUT:  Intake/Output Summary (Last 24 hours) at 02/14/15 0902 Last data filed  at 02/14/15 0900  Gross per 24 hour  Intake 7309.52 ml  Output   7885 ml  Net -575.48 ml    PHYSICAL EXAMINATION: General:  Acutely ill adult male on  vent Neuro:  rass 0, follows commands, anxious affect HEENT:  HD cath no discharge Cardiovascular:  s1s2 rrr, no m/r/g Lungs:  Clear, BL air entry + Abdomen:  RUQ perc drain in place with scant drainage. Hypoactive BS. distended Musculoskeletal:  No joint effusions or deformities. Skin:  gen edema  LABS:  CBC  Recent Labs Lab 02/12/15 0650  02/13/15 0415  02/14/15 0530 02/14/15 0625 02/14/15 0637  WBC 21.6*  --  23.8*  --  25.1*  --   --   HGB 8.0*  < > 8.7*  < > 7.6* 10.2* 9.2*  HCT 26.2*  < > 27.6*  < > 23.8* 30.0* 27.0*  PLT 250  --  160  --  156  --   --   < > = values in this interval not displayed. Coag's  Recent Labs Lab 02/12/15 0650 02/13/15 0415 02/14/15 0530  APTT 34 30 30   BMET  Recent Labs Lab 02/13/15 1230  02/13/15 1650  02/14/15 0530 02/14/15 0625 02/14/15 0637  NA 134*  < > 135  < > 135  137 137 134*  K 4.4  < > 4.0  < > 4.3  4.3 4.2 4.4  CL 89*  < > 90*  < > 94*  93* 88* 90*  CO2 37*  --  35*  --  36*  36*  --   --   BUN 40*  < > 41*  < > 38*  38* 30* 38*  CREATININE 2.50*  < > 2.32*  < > 2.06*  2.06* 1.50* 2.10*  GLUCOSE 407*  < > 339*  < > 150*  146* 203* 158*  < > = values in this interval not displayed. Electrolytes  Recent Labs Lab 02/12/15 0650  02/13/15 0415 02/13/15 1230 02/13/15 1650 02/14/15 0530  CALCIUM 8.8*  < > 9.6  9.5 9.1 9.4 8.1*  8.2*  MG 2.2  --  2.0  --   --  2.0  PHOS 3.1  3.1  < > 3.1  3.1  --  3.1 3.1  3.1  < > = values in this interval not displayed. Sepsis Markers No results for input(s): LATICACIDVEN, PROCALCITON, O2SATVEN in the last 168 hours.   ABG  Recent Labs Lab 02/11/15 0250 02/12/15 1205 02/13/15 1300  PHART 7.319* 7.428 7.384  PCO2ART 42.0 43.0 56.4*  PO2ART 243* 77.8* 72.2*   Liver Enzymes  Recent Labs Lab  02/10/15 0452  02/13/15 0415 02/13/15 1650 02/14/15 0530  AST 78*  --  36  --  55*  ALT 37  --  27  --  33  ALKPHOS 138*  --  107  --  100  BILITOT 1.3*  --  1.6*  --  2.0*  ALBUMIN 2.3*  < >  1.9*  2.0* 2.0* 1.9*  1.9*  < > = values in this interval not displayed.   Cardiac Enzymes No results for input(s): TROPONINI, PROBNP in the last 168 hours.   Glucose  Recent Labs Lab 02/14/15 0251 02/14/15 0348 02/14/15 0454 02/14/15 0552 02/14/15 0649 02/14/15 0743  GLUCAP 141* 144* 140* 139* 139* 136*    Imaging Dg Chest Port 1 View  02/14/2015   CLINICAL DATA:  Respiratory acidosis, coronary artery disease, MI, intubated patient.  EXAM: PORTABLE CHEST - 1 VIEW  COMPARISON:  Portable chest x-ray of February 13, 2015  FINDINGS: The lungs remain hypoinflated. Elevation of the right hemidiaphragm is stable. The interstitial markings are less conspicuous bilaterally. The cardiac silhouette remains enlarged. The pulmonary vascularity is less engorged.  The endotracheal tube tip lies 3.8 cm above the carina. The esophagogastric tube tip lies in the gastric cardia with the proximal port just distal to the GE junction the left internal jugular venous catheter tip projects over the proximal SVC. The PICC line tip projects over the junction of the middle and distal portions of the SVC. There are chronic changes associated with both shoulders.  IMPRESSION: Interval improvement in the pulmonary interstitium consistent with resolving interstitial edema. Persistent elevation of the right hemidiaphragm and likely right basilar atelectasis or pneumonia. Support tubes in reasonable position.   Electronically Signed   By: David  [place] M.D.   On: 02/14/2015 07:07    ASSESSMENT / PLAN:  PULMONARY A: Ventilator Dependent Respiratory Failure - in the setting of significant agitation, renal fx, delirium  Chronically elevated right HD Acute Hypoxic Respiratory Failure Right pleural effusion - progessing on  Abd CT 8/6 P:   Xopenex neb q6hr PRN SBts >> good ABG & Tvon PS 5/5 >> proceed with extubation  CARDIOVASCULAR CVL Right IJ 8/4 >> 8/22 RUE PICC 8/22 >>  A:  Severe sepsis/septic shock - resolved Hypotension - 8/26, started on neo post intubation, ? Sedation related Hx PAF - CHADVASC score 4; currently NSR  Prior NSTEMI  Pressor needed 8/27, prece dex related>? Adrenal insuff P:  Neo for MAP > 60 Keep stress steroids Add vaso  RENAL A:   AKI- in setting of septic shock, prior hypotension/ATN  P:   Cvvhd, even balance goals Chem in am   GASTROINTESTINAL A:   Infected hematoma s/p cholecystectomy - Now s/p CT guided Perc drain with subsequent upsize to 16 Fr Last CT of abdomen - 02/07/2015 - complex gall bladder collection GERD - worse 8/5 w/ associated UAW wheeze Intra-abdominal hemorrhage / hematoma - Hgb stable now Nausea / Vomiting - concern for ileus (8/25) Protein Calorie Malnutrition   P:   TNA per pharmacy  Protonix QD NPO Will discuss with surgery  - PO vs place post pyloric tube and feed gut?   HEMATOLOGIC A:   Anemia - in setting of intra-abdominal hemorrhage (8/4).  S/p 2 u PRBC on 8/4 & 2u 8/7   P:  Trend CBC  SCDs Transfuse for Hbg < 7, not indicated  INFECTIOUS A:   Severe sepsis/septic shock - presume source is infected hematoma from recent Cholecystectomy. Shock resolved.  S/p Perc GB fossa site drain 8/4 - minimal serous output High Risk for Fungemia P:   Trending WBC / Fever curve  ID following, appreciate input   Abscess 8/4>>>neg BCX2 8/4>>>neg  Peritoneal fluid GS 8/24 >> abundant WBC, no organisms seen Peritoneal fluid culture 8/24 >>  BCx2 8/25 >> ng  Meropenem 8/24 >>  Fluconazole 8/15 >> 8/23 anigulofungin 8/26>>> vanc 8/28>>>    ENDOCRINE A: DM - CBG's NOT well controlled AI P:   SSI Q4 Give Lantus 35 units & come off drip TPN  is contributing significant to hyperglycemia   NEUROLOGIC A:  Abdominal  Pain/Peritonitis -  Delirium  Critical Illness Deconditioning  P:   RASS goal: 0 Delirium prevention measures: minimize sedating medications, promote sleep / wake cycle  Fentanyl prn PT cons   FAMILY  - Updates:  Wife called and updated via phone per NP 8/26.   - Inter-disciplinary family meet or Palliative Care meeting due by:  Ongoing, family wants to continue full aggressive measures.  Hopeful that patient will be able to recover.    The patient is critically ill with multiple organ systems failure and requires high complexity decision making for assessment and support, frequent evaluation and titration of therapies, application of advanced monitoring technologies and extensive interpretation of multiple databases. Critical Care Time devoted to patient care services described in this note independent of APP time is 35 minutes.    Cyril Mourning MD. Tonny Bollman.  Pulmonary & Critical care Pager [phone] If no response call [phone]

## 2015-02-14 NOTE — Procedures (Signed)
Extubation Procedure Note  Patient Details:   Name: Larry [name] DOB: 06-11-1946 MRN: [id number]   Airway Documentation:     Evaluation  O2 sats:93 complications: none Patient tolerated procedure well. Bilateral Breath Sounds: Clear, Diminished Suctioning: Oral, Airway Pt able to speak  Per CCM order, pt extubated, placed on nasal cannula.   Pt tolerated well, no complications.  Revonda Humphrey 02/14/2015, 9:17 AM

## 2015-02-14 NOTE — Progress Notes (Signed)
Nutrition Follow-up  DOCUMENTATION CODES:   Severe malnutrition in context of acute illness/injury, Morbid obesity  INTERVENTION:   TPN per Pharmacy -Recommend when medically appropriate initiate trickle feeds of Nepro Carb Steady @ 10 ml/hr. Will provide 432 kcal, 19g protein and 175 ml of H2O.  TF recommendations: - If Panda vs PEG able to be placed, recommend Nepro @ 35 mL/hr with 60 mL Prostat TID which will provide 2112 kcal, 158 grams protein, and 610 mL free water  -RD to continue to monitor  NUTRITION DIAGNOSIS:   Inadequate oral intake related to lethargy/confusion as evidenced by meal completion < 25%.  Ongoing.  GOAL:   Patient will meet greater than or equal to 90% of their needs  Meeting 85% of kcal needs and 100% of protein needs.  MONITOR:   Labs, Weight trends, Skin, I & O's, Other (Comment) (TPN regimen)  ASSESSMENT:   69 y/o admitted in 10/05/2012 with Sepsis, hypotension, acute renal failure and acute cholecystitis. He had been place on Eliquis for his AF by the [place]. He had an MI 2 years ago and did not follow up with his cardiologist. He was acutely ill and underwent a percutaneous drain placement on 10/06/14 by IR. He went home on 10/11/14. He was readmitted on 12/30/14 and underwent laparoscopic cholecystectomy with IOC. He was left with a drain in place and the site had "SNOW' placed in the GB fossa for hemostasis after the procedure. Drain was removed last week but there was some brusing at the site. He is transferred from [facility] with complaints of increased abdominal pain. Pt was intubated from 8/26 - 8/29.  Pt extubated this morning. CBGs within normal range. Re-estimated needs provided below since pt no longer on ventilator. Meeting protein needs now.  Awaiting decision by surgery if pt will be started on diet or will have post pyloric tube placed. TF recommendations provided above.  Plan per Pharmacy: At 1800 today:  Continue Clinimix 5/15  (WITH electrolytes) at 155ml/hr - this is not providing 100% protein needs but do not want to worsen fluid overload or hyperglycemia - as discussed with Dr. Tyson Alias 8/28.   Since plan is to discontinue the insulin drip this morning, will add regular insulin back into the TPN to continue to control the hypergylcemia. Would prefer to add insulin to TPN rather than increase Lantus dose in case TPN is abruptly stopped. CCM beginning taper of stress dose steroids today. Add regular insulin to TPN so that patient receives 80 units of regular insulin over 24 hour infusion.  Hold lipid emulsions as changed to ICU status as of 8/23 (hold lipids for the first week of ICU admission). Will resume lipids tomorrow 8/30.  F/u surgery consult recommendations in regards to opportunities for enteral feeding  Goal: Clinimix 5/15 at a goal rate of 18ml/hr to provide: 120g/day protein, 1704 Kcal/day.  Labs reviewed: CBGs: 136-139 Elevated BUN & Creatinine  Diet Order:  .TPN (CLINIMIX-E) Adult Diet NPO time specified Except for: Ice Chips .TPN (CLINIMIX-E) Adult  Skin:  Wound (see comment) (abdominal incision)  Last BM:  8/27  Height:   Ht Readings from Last 1 Encounters:  01/20/15  (1.676 m)    Weight:   Wt Readings from Last 1 Encounters:  02/14/15 256 lb 9.9 oz (116.4 kg)    Ideal Body Weight:  64.54 kg (kg)  BMI:  Body mass index is 41.44 kg/(m^2).  Estimated Nutritional Needs:   Kcal:  2000-2400  Protein:  120-160g  Fluid:  2L/day  EDUCATION NEEDS:   No education needs identified at this time  Tilda Franco, MS, RD, LDN Pager: [phone] After Hours Pager: [phone]

## 2015-02-15 ENCOUNTER — Inpatient Hospital Stay (HOSPITAL_COMMUNITY): Payer: Medicare Other

## 2015-02-15 DIAGNOSIS — B999 Unspecified infectious disease: Secondary | ICD-10-CM

## 2015-02-15 LAB — CULTURE, BLOOD (ROUTINE X 2)
CULTURE: NO GROWTH
Culture: NO GROWTH

## 2015-02-15 LAB — PHOSPHORUS: PHOSPHORUS: 2.5 mg/dL (ref 2.5–4.6)

## 2015-02-15 LAB — POCT I-STAT, CHEM 8
BUN: 30 mg/dL — ABNORMAL HIGH (ref 6–20)
BUN: 36 mg/dL — AB (ref 6–20)
BUN: 30 mg/dL — AB (ref 6–20)
CALCIUM ION: 0.88 mmol/L — AB (ref 1.13–1.30)
CHLORIDE: 91 mmol/L — AB (ref 101–111)
CHLORIDE: 91 mmol/L — AB (ref 101–111)
CHLORIDE: 89 mmol/L — AB (ref 101–111)
CREATININE: 1.4 mg/dL — AB (ref 0.61–1.24)
Calcium, Ion: 0.34 mmol/L — CL (ref 1.13–1.30)
Calcium, Ion: 0.34 mmol/L — CL (ref 1.13–1.30)
Creatinine, Ser: 1.4 mg/dL — ABNORMAL HIGH (ref 0.61–1.24)
Creatinine, Ser: 1.9 mg/dL — ABNORMAL HIGH (ref 0.61–1.24)
GLUCOSE: 235 mg/dL — AB (ref 65–99)
Glucose, Bld: 186 mg/dL — ABNORMAL HIGH (ref 65–99)
Glucose, Bld: 230 mg/dL — ABNORMAL HIGH (ref 65–99)
HCT: 25 % — ABNORMAL LOW (ref 39.0–52.0)
HCT: 23 % — ABNORMAL LOW (ref 39.0–52.0)
HEMATOCRIT: 25 % — AB (ref 39.0–52.0)
HEMOGLOBIN: 8.5 g/dL — AB (ref 13.0–17.0)
Hemoglobin: 7.8 g/dL — ABNORMAL LOW (ref 13.0–17.0)
Hemoglobin: 8.5 g/dL — ABNORMAL LOW (ref 13.0–17.0)
POTASSIUM: 4 mmol/L (ref 3.5–5.1)
POTASSIUM: 4.2 mmol/L (ref 3.5–5.1)
POTASSIUM: 4.1 mmol/L (ref 3.5–5.1)
SODIUM: 138 mmol/L (ref 135–145)
SODIUM: 136 mmol/L (ref 135–145)
SODIUM: 139 mmol/L (ref 135–145)
TCO2: 28 mmol/L (ref 0–100)
TCO2: 31 mmol/L (ref 0–100)
TCO2: 27 mmol/L (ref 0–100)

## 2015-02-15 LAB — RENAL FUNCTION PANEL
ALBUMIN: 1.7 g/dL — AB (ref 3.5–5.0)
ALBUMIN: 1.8 g/dL — AB (ref 3.5–5.0)
ANION GAP: 7 (ref 5–15)
Anion gap: 9 (ref 5–15)
BUN: 41 mg/dL — ABNORMAL HIGH (ref 6–20)
BUN: 55 mg/dL — AB (ref 6–20)
CALCIUM: 7.3 mg/dL — AB (ref 8.9–10.3)
CO2: 36 mmol/L — ABNORMAL HIGH (ref 22–32)
CO2: 34 mmol/L — ABNORMAL HIGH (ref 22–32)
CREATININE: 2.68 mg/dL — AB (ref 0.61–1.24)
Calcium: 7.8 mg/dL — ABNORMAL LOW (ref 8.9–10.3)
Chloride: 93 mmol/L — ABNORMAL LOW (ref 101–111)
Chloride: 93 mmol/L — ABNORMAL LOW (ref 101–111)
Creatinine, Ser: 1.9 mg/dL — ABNORMAL HIGH (ref 0.61–1.24)
GFR calc Af Amer: 40 mL/min — ABNORMAL LOW (ref >60)
GFR calc Af Amer: 26 mL/min — ABNORMAL LOW (ref >60)
GFR calc non Af Amer: 35 mL/min — ABNORMAL LOW (ref >60)
GFR, EST NON AFRICAN AMERICAN: 23 mL/min — AB (ref >60)
GLUCOSE: 184 mg/dL — AB (ref 65–99)
GLUCOSE: 131 mg/dL — AB (ref 65–99)
PHOSPHORUS: 2.5 mg/dL (ref 2.5–4.6)
PHOSPHORUS: 3.2 mg/dL (ref 2.5–4.6)
POTASSIUM: 4.6 mmol/L (ref 3.5–5.1)
Potassium: 4.5 mmol/L (ref 3.5–5.1)
SODIUM: 136 mmol/L (ref 135–145)
Sodium: 136 mmol/L (ref 135–145)

## 2015-02-15 LAB — GLUCOSE, CAPILLARY
GLUCOSE-CAPILLARY: 114 mg/dL — AB (ref 65–99)
GLUCOSE-CAPILLARY: 118 mg/dL — AB (ref 65–99)
GLUCOSE-CAPILLARY: 120 mg/dL — AB (ref 65–99)
GLUCOSE-CAPILLARY: 133 mg/dL — AB (ref 65–99)
GLUCOSE-CAPILLARY: 134 mg/dL — AB (ref 65–99)
GLUCOSE-CAPILLARY: 143 mg/dL — AB (ref 65–99)
GLUCOSE-CAPILLARY: 144 mg/dL — AB (ref 65–99)
GLUCOSE-CAPILLARY: 148 mg/dL — AB (ref 65–99)
GLUCOSE-CAPILLARY: 171 mg/dL — AB (ref 65–99)
GLUCOSE-CAPILLARY: 171 mg/dL — AB (ref 65–99)
GLUCOSE-CAPILLARY: 175 mg/dL — AB (ref 65–99)
GLUCOSE-CAPILLARY: 176 mg/dL — AB (ref 65–99)
GLUCOSE-CAPILLARY: 176 mg/dL — AB (ref 65–99)
GLUCOSE-CAPILLARY: 193 mg/dL — AB (ref 65–99)
Glucose-Capillary: 108 mg/dL — ABNORMAL HIGH (ref 65–99)
Glucose-Capillary: 118 mg/dL — ABNORMAL HIGH (ref 65–99)
Glucose-Capillary: 124 mg/dL — ABNORMAL HIGH (ref 65–99)
Glucose-Capillary: 134 mg/dL — ABNORMAL HIGH (ref 65–99)
Glucose-Capillary: 136 mg/dL — ABNORMAL HIGH (ref 65–99)
Glucose-Capillary: 139 mg/dL — ABNORMAL HIGH (ref 65–99)
Glucose-Capillary: 142 mg/dL — ABNORMAL HIGH (ref 65–99)
Glucose-Capillary: 145 mg/dL — ABNORMAL HIGH (ref 65–99)
Glucose-Capillary: 151 mg/dL — ABNORMAL HIGH (ref 65–99)
Glucose-Capillary: 154 mg/dL — ABNORMAL HIGH (ref 65–99)
Glucose-Capillary: 170 mg/dL — ABNORMAL HIGH (ref 65–99)
Glucose-Capillary: 172 mg/dL — ABNORMAL HIGH (ref 65–99)
Glucose-Capillary: 185 mg/dL — ABNORMAL HIGH (ref 65–99)
Glucose-Capillary: 219 mg/dL — ABNORMAL HIGH (ref 65–99)

## 2015-02-15 LAB — CBC
HCT: 22.6 % — ABNORMAL LOW (ref 39.0–52.0)
HEMOGLOBIN: 7 g/dL — AB (ref 13.0–17.0)
MCH: 29.4 pg (ref 26.0–34.0)
MCHC: 31 g/dL (ref 30.0–36.0)
MCV: 95 fL (ref 78.0–100.0)
Platelets: 114 10*3/uL — ABNORMAL LOW (ref 150–400)
RBC: 2.38 MIL/uL — AB (ref 4.22–5.81)
RDW: 21.9 % — ABNORMAL HIGH (ref 11.5–15.5)
WBC: 15.5 10*3/uL — AB (ref 4.0–10.5)

## 2015-02-15 LAB — MAGNESIUM: MAGNESIUM: 2 mg/dL (ref 1.7–2.4)

## 2015-02-15 LAB — APTT: aPTT: 26 seconds (ref 24–37)

## 2015-02-15 LAB — CALCIUM, IONIZED: Calcium, Ionized, Serum: 4.1 mg/dL — ABNORMAL LOW (ref 4.5–5.6)

## 2015-02-15 MED ORDER — TRACE MINERALS CR-CU-MN-SE-ZN 10-1000-500-60 MCG/ML IV SOLN
INTRAVENOUS | Status: DC
  Administered 2015-02-15: 18:00:00 via INTRAVENOUS
  Filled 2015-02-15: qty 2400

## 2015-02-15 MED ORDER — FAT EMULSION 20 % IV EMUL
240.0000 mL | INTRAVENOUS | Status: DC
  Administered 2015-02-15: 240 mL via INTRAVENOUS
  Filled 2015-02-15: qty 250

## 2015-02-15 MED ORDER — HYDROCORTISONE NA SUCCINATE PF 100 MG IJ SOLR
50.0000 mg | Freq: Two times a day (BID) | INTRAMUSCULAR | Status: DC
  Administered 2015-02-15: 50 mg via INTRAVENOUS
  Filled 2015-02-15: qty 2

## 2015-02-15 MED ORDER — HEPARIN SODIUM (PORCINE) 5000 UNIT/ML IJ SOLN
5000.0000 [IU] | Freq: Three times a day (TID) | INTRAMUSCULAR | Status: DC
  Administered 2015-02-15 – 2015-02-17 (×6): 5000 [IU] via SUBCUTANEOUS
  Filled 2015-02-15 (×6): qty 1

## 2015-02-15 MED ORDER — SODIUM CHLORIDE 0.9 % IV SOLN
500.0000 mg | Freq: Two times a day (BID) | INTRAVENOUS | Status: DC
  Administered 2015-02-15 – 2015-02-16 (×2): 500 mg via INTRAVENOUS
  Filled 2015-02-15 (×3): qty 0.5

## 2015-02-15 NOTE — Progress Notes (Signed)
CSW continues to follow to assist with d/c planning. Pt remains in ICU. D/C date is undetermined at this time. CSW will continue to follow .  Cori Razor LCSW [phone]

## 2015-02-15 NOTE — Evaluation (Signed)
SLP Cancellation Note  Patient Details Name: Larry [name] MRN: [id number] DOB: 1945-12-31   Cancelled treatment:       Reason Eval/Treat Not Completed: Medical issues which prohibited therapy (pt currently receiving intermittent suction via NG = will reattempt at later time)   Donavan Burnet, MS [facility] SLP [phone]

## 2015-02-15 NOTE — Progress Notes (Signed)
ANTIBIOTIC CONSULT NOTE  Pharmacy Consult for Meropenem, Vancomycin Indication: infected hematoma s/p cholecystectomy/IAI  No Known Allergies  Patient Measurements: Height: 5\' 6"  (167.6 cm) Weight: 259 lb 14.8 oz (117.9 kg) IBW/kg (Calculated) : 63.8   Vital Signs: BP: 112/74 mmHg (08/30 1300) Pulse Rate: 118 (08/30 1300) Intake/Output from previous day: 08/29 0701 - 08/30 0700 In: 5593.3 [I.V.:2443.3; IV Piggyback:500; TPN:2400] Out: 5564 [Urine:160; Emesis/NG output:50; Drains:5] Intake/Output from this shift: Total I/O In: 1094 [I.V.:394; IV Piggyback:200; TPN:500] Out: 454 [Urine:21; Other:433]  Labs:  Recent Labs  02/13/15 0415  02/14/15 0530  02/15/15 0430 02/15/15 0622 02/15/15 0916 02/15/15 0946  WBC 23.8*  --  25.1*  --  15.5*  --   --   --   HGB 8.7*  < > 7.6*  < > 7.0* 8.5* 7.8* 8.5*  PLT 160  --  156  --  114*  --   --   --   CREATININE 2.14*  2.23*  < > 2.06*  2.06*  < > 1.90* 1.40* 1.90* 1.40*  < > = values in this interval not displayed. Estimated Creatinine Clearance: 61 mL/min (by C-G formula based on Cr of 1.4). No results for input(s): VANCOTROUGH, VANCOPEAK, VANCORANDOM, GENTTROUGH, GENTPEAK, GENTRANDOM, TOBRATROUGH, TOBRAPEAK, TOBRARND, AMIKACINPEAK, AMIKACINTROU, AMIKACIN in the last 72 hours.   Microbiology: Recent Results (from the past 720 hour(s))  Culture, routine-abscess     Status: None   Collection Time: 01/20/15  1:00 PM  Result Value Ref Range Status   Specimen Description ABSCESS GALL BLADDER FOSSA  Final   Special Requests Normal  Final   Gram Stain   Final    ABUNDANT WBC PRESENT,BOTH PMN AND MONONUCLEAR NO SQUAMOUS EPITHELIAL CELLS SEEN NO ORGANISMS SEEN Performed at [facility]    Culture   Final    MULTIPLE ORGANISMS PRESENT, NONE PREDOMINANT Note: NO STAPHYLOCOCCUS AUREUS ISOLATED NO GROUP A STREP (S.PYOGENES) ISOLATED Performed at [facility]    Report Status 01/23/2015 FINAL  Final  Anaerobic  culture     Status: None   Collection Time: 01/20/15  1:00 PM  Result Value Ref Range Status   Specimen Description ABSCESS GALL BLADDER FOSSA  Final   Special Requests Normal  Final   Gram Stain   Final    ABUNDANT WBC PRESENT,BOTH PMN AND MONONUCLEAR NO SQUAMOUS EPITHELIAL CELLS SEEN NO ORGANISMS SEEN Performed at [facility]    Culture   Final    NO ANAEROBES ISOLATED Performed at [facility]    Report Status 01/26/2015 FINAL  Final  MRSA PCR Screening     Status: None   Collection Time: 01/20/15  1:39 PM  Result Value Ref Range Status   MRSA by PCR NEGATIVE NEGATIVE Final    Comment:        The GeneXpert MRSA Assay (FDA approved for NASAL specimens only), is one component of a comprehensive MRSA colonization surveillance program. It is not intended to diagnose MRSA infection nor to guide or monitor treatment for MRSA infections. Performed at [facility]   Culture, blood (routine x 2)     Status: None   Collection Time: 01/20/15  1:50 PM  Result Value Ref Range Status   Specimen Description BLOOD RIGHT ARM  Final   Special Requests IN PEDIATRIC BOTTLE 4CC  Final   Culture   Final    NO GROWTH 5 DAYS Performed at [facility]    Report Status 01/25/2015 FINAL  Final  Culture,  blood (routine x 2)     Status: None   Collection Time: 01/20/15  1:55 PM  Result Value Ref Range Status   Specimen Description BLOOD RIGHT HAND  Final   Special Requests IN PEDIATRIC BOTTLE 4CC  Final   Culture   Final    NO GROWTH 5 DAYS Performed at [facility]    Report Status 01/25/2015 FINAL  Final  Urine culture     Status: None   Collection Time: 01/30/15 11:40 AM  Result Value Ref Range Status   Specimen Description URINE, RANDOM  Final   Special Requests NONE  Final   Culture   Final    >=100,000 COLONIES/mL YEAST Performed at [facility]    Report Status 02/01/2015 FINAL  Final  Culture, body fluid-bottle     Status: None    Collection Time: 02/09/15  2:10 PM  Result Value Ref Range Status   Specimen Description FLUID PERITONEAL  Final   Special Requests BOTTLES DRAWN AEROBIC AND ANAEROBIC 10CC  Final   Culture   Final    NO GROWTH 5 DAYS Performed at [facility]    Report Status 02/14/2015 FINAL  Final  Gram stain     Status: None   Collection Time: 02/09/15  2:10 PM  Result Value Ref Range Status   Specimen Description FLUID PERITONEAL  Final   Special Requests BOTTLES DRAWN AEROBIC AND ANAEROBIC 10CC  Final   Gram Stain   Final    ABUNDANT WBC PRESENT,BOTH PMN AND MONONUCLEAR NO ORGANISMS SEEN Performed at [facility]    Report Status 02/09/2015 FINAL  Final  Culture, blood (routine x 2)     Status: None (Preliminary result)   Collection Time: 02/10/15  4:00 PM  Result Value Ref Range Status   Specimen Description BLOOD LEFT ANTECUBITAL  Final   Special Requests BOTTLES DRAWN AEROBIC ONLY 7CC  Final   Culture   Final    NO GROWTH 4 DAYS Performed at [facility]    Report Status PENDING  Incomplete  Culture, blood (routine x 2)     Status: None (Preliminary result)   Collection Time: 02/10/15  4:15 PM  Result Value Ref Range Status   Specimen Description BLOOD LEFT FOREARM  Final   Special Requests BOTTLES DRAWN AEROBIC ONLY 5CC  Final   Culture   Final    NO GROWTH 4 DAYS Performed at [facility]    Report Status PENDING  Incomplete   Assessment: 72 yoM presents with increased abdominal pain since cholecystectomy last month. Patient was discharged with a drain in place which was removed last week.  Patient transferred from [facility] on 8/3 with septic shock likely due to infected hematoma in the GB fossa. Antibiotics started for intra-abdominal infection, possible UTI.   8/3 >> Vanc >>  8/10 8/3 >> Zosyn >> 8/24 8/17 >> Diflucan >> 8/23 8/24 >> Meropenem >> 8/25 >> Anidulafungin >> 8/28 >> Vancomycin >>  8/4 blood x 2: neg FINAL 8/4 GB fossa abscess:  multiple organisms present, none predominant. No Staph aureus or Group A strep (S. Pyogenes) isolated 8/4 abscess (anaerobic): neg FINAL 8/4 MRSA PCR: negative 8/14 urine: yeast-final 8/24 peritoneal fluid: no growth, final 8/25 blood x 2: no growth, final  Today, 02/15/2015 - Day 28 antibiotics - Tmax: afebrile - WBC: elevated but decreasing, on hydrocortisone (tapering off) - Renal: CRRT stopped this AM, watching for recovery  Plan:  Reduce meropenem to 500 mg  IV q12h now that off of CRRT F/u SCr in AM and reassess abx dosing.  Will check a vancomycin level tomorrow. F/u ID recommendations  Clance Boll, PharmD, BCPS Pager: [phone] 02/15/2015 2:18 PM

## 2015-02-15 NOTE — Progress Notes (Signed)
PULMONARY / CRITICAL CARE MEDICINE   Name: Larry [name] MRN: [id number] DOB: [birth year]    ADMISSION DATE:  01/19/2015 CONSULTATION DATE:  8/4  REFERRING MD :  [name]  CHIEF COMPLAINT:  Septic shock   INITIAL PRESENTATION:  69 year old male w/ CAF on Elliquis who recently underwent lap chole on 7/14. Was discharged home w/drain. Drain removed about a week before presentation to ER. Admitted directly from [facility] on 8/3 w/ septic shock which was likely due to infected hematoma in the GB fossa. PCCM asked to see after new perc drain placed on 8/4, when he remained hypotensive.  Required CRRT 8/23 for AKI  Intubated 8/26 for desaturations, significant agitation, delirium  STUDIES:  CT abdomen/Pelvis W/ 7/28 - Gas & fluid at cholecystectomy site w/o discrete abscess. Min ascites. Small right pleural eff. CT abdomen/pelvis w/o 8/5 - Gas between liver & stomach. No additional free air. Increased flow void & high density layering c/w increasing hemorrhage as well as increased right pleural eff. CT abdomen/pelvis 8/18 >> 9.8 cm hematoma with gas in the gallbladder fossa, indwelling pigtail drainage catheter, 12.6 cm hematoma along the posterior R liver, mod ascites, mild wall thickening involving the R colon CT abdomen/pelvis 8/22 >> stable perihepatic hematoma, stable gallbladder fossa collection despite adequate drainage cath placement, significant free fluid within the abdomen/pelvis slightly increased from prior exam  SIGNIFICANT EVENTS: Admitted 8/3 w/ abd pain and hypotension  8/04  hypotensive over-night. Went for [facility] drain of GB fossa. Found what looked like old infected hematoma. PCCM asked to see post-op for shock 8/05  off pressors. WOB worse.  8/06  WOB & wheezing slightly worse. Progressing abdominal pain 8/07  Hgb declining w/ increasing intraperitoneal hemorrhage 8/07  Transfused 2u PRBCs 8/09  work of breathing still significant w/ marked upper airway component   8/10  breathing better. 1.6 liters negative. Slight bump in scr. Vanc stopped. Getting OOB for first time 8/11  eating regular diet. Up in chair. Feeling better.  Respiratory status improved.  PCCM s/o 8/14  Renal consulted for rising sr cr (to 3.60), baseline sr cr 1.1-1.5 (prior IV contrast 7/28).   8/14  Concern for yeast / bacterial UTI  8/15  ID consulted > abx narrowed to zosyn only, fluconazole for yeast  8/16  UOP increased on IV lasix 8/18  NPO, sr cr improving, CT abd as above  8/18  GB drain upsized per IR to 16 Fr 8/19  Abd pain, eating well.  Diarrhea after ensure.  To PO lasix.  Discussed gastrostomy tube with IR 8/22  Sr Cr rising, intermittent vomiting.  CT abd as above.  Increased confusion 8/23  HD cath inserted, CRRT initiated.  Gastrostomy tube held off due to CRRT & Hgb drop 8/24  WBC elevated, cultures repeated.   8/24  Paracentesis >> 150 ml "bloody bile" fluid removed, loculated and difficult to aspirate 8/25  Palliative care consulted, PCCM consulted.  Vomiting with NGT insertion.  8/26  Intubated early am(0200) for desaturations, significant agitation, delirium   SUBJECTIVE:   C/o vomiting x1 bilious Afebrile Denies pain Remains critically ill, on pressors & CRRT  VITAL SIGNS: Temp:  [98.2 F (36.8 C)-98.9 F (37.2 C)] 98.4 F (36.9 C) (08/30 0000) Pulse Rate:  [28-125] 107 (08/30 0900) Resp:  [14-41] 32 (08/30 0900) BP: (66-149)/(10-127) 99/52 mmHg (08/30 0900) SpO2:  [92 %-100 %] 92 % (08/30 0900) Weight:  [259 lb 14.8 oz (117.9 kg)] 259 lb 14.8 oz (  117.9 kg) (08/30 0456)  HEMODYNAMICS: CVP:  [5 mmHg-7 mmHg] 6 mmHg   VENTILATOR SETTINGS:     INTAKE / OUTPUT:  Intake/Output Summary (Last 24 hours) at 02/15/15 1007 Last data filed at 02/15/15 1000  Gross per 24 hour  Intake 5227.4 ml  Output   5120 ml  Net  107.4 ml    PHYSICAL EXAMINATION: General:  Acutely ill adult male  Neuro:  rass 0, follows commands, some confusion,anxious  affect HEENT:  HD cath no discharge Cardiovascular:  s1s2 rrr, no m/r/g Lungs:  Clear, BL air entry + Abdomen:  RUQ perc drain in place with scant drainage. Hypoactive BS. Distended, mild diffuse tenderness Musculoskeletal:  No joint effusions or deformities. Skin:  gen edema  LABS:  CBC  Recent Labs Lab 02/13/15 0415  02/14/15 0530  02/14/15 2252 02/15/15 0430 02/15/15 0622  WBC 23.8*  --  25.1*  --   --  15.5*  --   HGB 8.7*  < > 7.6*  < > 17.0 7.0* 8.5*  HCT 27.6*  < > 23.8*  < > 50.0 22.6* 25.0*  PLT 160  --  156  --   --  114*  --   < > = values in this interval not displayed. Coag's  Recent Labs Lab 02/13/15 0415 02/14/15 0530 02/15/15 0430  APTT 30 30 26    BMET  Recent Labs Lab 02/13/15 1650  02/14/15 0530  02/14/15 2252 02/15/15 0430 02/15/15 0622  NA 135  < > 135  137  < > 139 136 138  K 4.0  < > 4.3  4.3  < > 3.9 4.5 4.0  CL 90*  < > 94*  93*  < > 91* 93* 91*  CO2 35*  --  36*  36*  --   --  36*  --   BUN 41*  < > 38*  38*  < > 30* 41* 30*  CREATININE 2.32*  < > 2.06*  2.06*  < > 1.50* 1.90* 1.40*  GLUCOSE 339*  < > 150*  146*  < > 196* 184* 235*  < > = values in this interval not displayed. Electrolytes  Recent Labs Lab 02/13/15 0415  02/13/15 1650 02/14/15 0530 02/15/15 0430  CALCIUM 9.6  9.5  < > 9.4 8.1*  8.2* 7.3*  MG 2.0  --   --  2.0 2.0  PHOS 3.1  3.1  --  3.1 3.1  3.1 2.5  2.5  < > = values in this interval not displayed. Sepsis Markers No results for input(s): LATICACIDVEN, PROCALCITON, O2SATVEN in the last 168 hours.   ABG  Recent Labs Lab 02/12/15 1205 02/13/15 1300 02/14/15 0842  PHART 7.428 7.384 7.420  PCO2ART 43.0 56.4* 52.3*  PO2ART 77.8* 72.2* 73.7*   Liver Enzymes  Recent Labs Lab 02/10/15 0452  02/13/15 0415 02/13/15 1650 02/14/15 0530 02/15/15 0430  AST 78*  --  36  --  55*  --   ALT 37  --  27  --  33  --   ALKPHOS 138*  --  107  --  100  --   BILITOT 1.3*  --  1.6*  --  2.0*  --    ALBUMIN 2.3*  < > 1.9*  2.0* 2.0* 1.9*  1.9* 1.7*  < > = values in this interval not displayed.   Cardiac Enzymes No results for input(s): TROPONINI, PROBNP in the last 168 hours.   Glucose  Recent Labs Lab 02/14/15 1918  02/14/15 2026 02/14/15 2133 02/14/15 2231 02/14/15 2337 02/15/15 0042  GLUCAP 142* 120* 134* 219* 124* 114*    Imaging Dg Chest Port 1 View  02/15/2015   CLINICAL DATA:  Respiratory failure  EXAM: PORTABLE CHEST - 1 VIEW  COMPARISON:  02/14/2015  FINDINGS: The endotracheal tube and nasogastric tube have been removed. The left jugular central line extends into the SVC. There is unchanged right hemidiaphragm elevation. There is continued improvement, with ongoing resolution of interstitial edema. No confluent airspace consolidation is evident.  IMPRESSION: Continued improvement. Removal of endotracheal tube and nasogastric tube.   Electronically Signed   By: Ellery Plunk M.D.   On: 02/15/2015 06:49    ASSESSMENT / PLAN:  PULMONARY A: ETT 8/26 >> 8/29 Acute Hypoxic Respiratory Failure - in the setting of significant agitation, renal fx, delirium  - resolved Chronically elevated right HD Right pleural effusion - progessing on Abd CT 8/6 P:   Xopenex neb q6hr PRN   CARDIOVASCULAR CVL Right IJ 8/4 >> 8/22 RUE PICC 8/22 >>  A:  Severe sepsis/septic shock - resolved Hypotension - 8/26, started on neo post intubation, smouldering sepsis Hx PAF - CHADVASC score 4; currently NSR  Prior NSTEMI  Pressor needed 8/27, prece dex related>? Adrenal insuff P:  Neo for MAP > 60 -taper Keep stress steroids dc vaso  RENAL A:   AKI- in setting of septic shock, prior hypotension/ATN  P:   Cvvhd, even balance goals Making some urine - should we give him a break from CRRT  GASTROINTESTINAL A:   Infected hematoma s/p cholecystectomy - Now s/p CT guided Perc drain with subsequent upsize to 16 Fr Last CT of abdomen - 02/07/2015 - complex gall bladder  collection GERD - worse 8/5 w/ associated UAW wheeze Intra-abdominal hemorrhage / hematoma - Hgb stable now Nausea / Vomiting - concern for ileus (8/25) Protein Calorie Malnutrition   P:   TNA per pharmacy  Protonix QD NPO Given nausea - will need to  replace NG/ post pyloric tube  & hold TFs  HEMATOLOGIC A:   Anemia - in setting of intra-abdominal hemorrhage (8/4).  S/p 2 u PRBC on 8/4 & 2u 8/7   P:  Trend CBC  SCDs Transfuse for Hbg < 7  INFECTIOUS A:   Severe sepsis/septic shock - presume source is infected hematoma from recent Cholecystectomy. Shock resolved.  S/p Perc GB fossa site drain 8/4 - minimal serous output High Risk for Fungemia P:   Trending WBC / Fever curve  ID following, appreciate input   Abscess 8/4>>>neg BCX2 8/4>>>neg  Peritoneal fluid GS 8/24 >> abundant WBC, no organisms seen Peritoneal fluid culture 8/24 >>  BCx2 8/25 >> ng  Meropenem 8/24 >>  Fluconazole 8/15 >> 8/23 anigulofungin 8/26>>> vanc 8/28>>>8/29    ENDOCRINE A: DM - CBG's NOT well controlled AI P:   SSI Q4 Give Lantus 35 units & come off drip once CBGs <180 consistently Add insulin to TNA   NEUROLOGIC A:  Abdominal Pain/Peritonitis -  Delirium  Critical Illness Deconditioning  P:   RASS goal: 0 Delirium prevention measures: minimize sedating medications, promote sleep / wake cycle  Fentanyl prn PT cons   FAMILY  - Updates:  Wife called and updated via phone per NP 8/26.   - Inter-disciplinary family meet or Palliative Care meeting due by:  Ongoing, family wants to continue full aggressive measures.  Hopeful that patient will be able to recover.    The patient is critically ill  with multiple organ systems failure and requires high complexity decision making for assessment and support, frequent evaluation and titration of therapies, application of advanced monitoring technologies and extensive interpretation of multiple databases. Critical Care Time devoted to  patient care services described in this note independent of APP time is 35 minutes.    Cyril Mourning MD. Tonny Bollman. [facility] Pager [phone] If no response call [phone]

## 2015-02-15 NOTE — Progress Notes (Signed)
Subjective: Denies significant abd pain. C/o some scrotal /buttock pain while sitting in chair. Had episode of emesis but also had BM; vaso off. Weaning Neo  Objective: Vital signs in last 24 hours: Temp:  [98.4 F (36.9 C)-98.9 F (37.2 C)] 98.4 F (36.9 C) (08/30 0000) Pulse Rate:  [54-148] 119 (08/30 1545) Resp:  [0-41] 23 (08/30 1545) BP: (66-133)/(28-116) 125/94 mmHg (08/30 1545) SpO2:  [92 %-100 %] 96 % (08/30 1545) Weight:  [117.9 kg (259 lb 14.8 oz)] 117.9 kg (259 lb 14.8 oz) (08/30 0456) Last BM Date: 02/14/15  Intake/Output from previous day: 08/29 0701 - 08/30 0700 In: 5593.3 [I.V.:2443.3; IV Piggyback:500; TPN:2400] Out: 5564 [Urine:160; Emesis/NG output:50; Drains:5] Intake/Output this shift: Total I/O In: 1368.9 [I.V.:440.5; IV Piggyback:200; TPN:728.3] Out: 666 [Urine:33; Emesis/NG output:200; Other:433]  Awake, alert, ox2; takes a while to form words but no neuro deficits IS about 800 Soft, obese, min TTP; nothing really in drain  Lab Results:   Recent Labs  02/14/15 0530  02/15/15 0430  02/15/15 0916 02/15/15 0946  WBC 25.1*  --  15.5*  --   --   --   HGB 7.6*  < > 7.0*  < > 7.8* 8.5*  HCT 23.8*  < > 22.6*  < > 23.0* 25.0*  PLT 156  --  114*  --   --   --   < > = values in this interval not displayed. BMET  Recent Labs  02/14/15 0530  02/15/15 0430  02/15/15 0916 02/15/15 0946  NA 135  137  < > 136  < > 136 139  K 4.3  4.3  < > 4.5  < > 4.2 4.1  CL 94*  93*  < > 93*  < > 91* 89*  CO2 36*  36*  --  36*  --   --   --   GLUCOSE 150*  146*  < > 184*  < > 186* 230*  BUN 38*  38*  < > 41*  < > 36* 30*  CREATININE 2.06*  2.06*  < > 1.90*  < > 1.90* 1.40*  CALCIUM 8.1*  8.2*  --  7.3*  --   --   --   < > = values in this interval not displayed. PT/INR No results for input(s): LABPROT, INR in the last 72 hours. ABG  Recent Labs  02/13/15 1300 02/14/15 0842  PHART 7.384 7.420  HCO3 32.9* 33.3*    Studies/Results: Dg Chest Port  1 View  02/15/2015   CLINICAL DATA:  Respiratory failure  EXAM: PORTABLE CHEST - 1 VIEW  COMPARISON:  02/14/2015  FINDINGS: The endotracheal tube and nasogastric tube have been removed. The left jugular central line extends into the SVC. There is unchanged right hemidiaphragm elevation. There is continued improvement, with ongoing resolution of interstitial edema. No confluent airspace consolidation is evident.  IMPRESSION: Continued improvement. Removal of endotracheal tube and nasogastric tube.   Electronically Signed   By: Ellery Plunk M.D.   On: 02/15/2015 06:49   Dg Chest Port 1 View  02/14/2015   CLINICAL DATA:  Respiratory acidosis, coronary artery disease, MI, intubated patient.  EXAM: PORTABLE CHEST - 1 VIEW  COMPARISON:  Portable chest x-ray of February 13, 2015  FINDINGS: The lungs remain hypoinflated. Elevation of the right hemidiaphragm is stable. The interstitial markings are less conspicuous bilaterally. The cardiac silhouette remains enlarged. The pulmonary vascularity is less engorged.  The endotracheal tube tip lies 3.8 cm above the carina. The  esophagogastric tube tip lies in the gastric cardia with the proximal port just distal to the GE junction the left internal jugular venous catheter tip projects over the proximal SVC. The PICC line tip projects over the junction of the middle and distal portions of the SVC. There are chronic changes associated with both shoulders.  IMPRESSION: Interval improvement in the pulmonary interstitium consistent with resolving interstitial edema. Persistent elevation of the right hemidiaphragm and likely right basilar atelectasis or pneumonia. Support tubes in reasonable position.   Electronically Signed   By: David  [place] M.D.   On: 02/14/2015 07:07    Anti-infectives: Anti-infectives    Start     Dose/Rate Route Frequency Ordered Stop   02/15/15 2200  meropenem (MERREM) 500 mg in sodium chloride 0.9 % 50 mL IVPB     500 mg 100 mL/hr over 30 Minutes  Intravenous Every 12 hours 02/15/15 1415     02/14/15 1200  vancomycin (VANCOCIN) IVPB 1000 mg/200 mL premix     1,000 mg 200 mL/hr over 60 Minutes Intravenous Every 24 hours 02/13/15 0920     02/13/15 1000  vancomycin (VANCOCIN) 1,750 mg in sodium chloride 0.9 % 500 mL IVPB     1,750 mg 250 mL/hr over 120 Minutes Intravenous  Once 02/13/15 0836 02/13/15 1210   02/12/15 1630  meropenem (MERREM) 500 mg in sodium chloride 0.9 % 50 mL IVPB  Status:  Discontinued     500 mg 100 mL/hr over 30 Minutes Intravenous Every 6 hours 02/12/15 1106 02/15/15 1415   02/11/15 1800  anidulafungin (ERAXIS) 100 mg in sodium chloride 0.9 % 100 mL IVPB     100 mg over 90 Minutes Intravenous Every 24 hours 02/10/15 1744     02/10/15 1730  anidulafungin (ERAXIS) 200 mg in sodium chloride 0.9 % 200 mL IVPB     200 mg over 180 Minutes Intravenous  Once 02/10/15 1720 02/10/15 2230   02/10/15 0200  meropenem (MERREM) 500 mg in sodium chloride 0.9 % 50 mL IVPB  Status:  Discontinued     500 mg 100 mL/hr over 30 Minutes Intravenous Every 8 hours 02/09/15 1808 02/12/15 1106   02/10/15 0000  meropenem (MERREM) 500 mg in sodium chloride 0.9 % 50 mL IVPB  Status:  Discontinued     500 mg 100 mL/hr over 30 Minutes Intravenous 4 times per day 02/09/15 1655 02/09/15 1808   02/09/15 1800  meropenem (MERREM) 1 g in sodium chloride 0.9 % 100 mL IVPB     1 g 200 mL/hr over 30 Minutes Intravenous  Once 02/09/15 1655 02/09/15 1818   02/08/15 1800  piperacillin-tazobactam (ZOSYN) IVPB 3.375 g  Status:  Discontinued     3.375 g 100 mL/hr over 30 Minutes Intravenous 4 times per day 02/08/15 1403 02/09/15 1655   02/07/15 1400  piperacillin-tazobactam (ZOSYN) IVPB 2.25 g  Status:  Discontinued     2.25 g 100 mL/hr over 30 Minutes Intravenous 3 times per day 02/07/15 0750 02/08/15 1403   02/04/15 1200  piperacillin-tazobactam (ZOSYN) IVPB 2.25 g  Status:  Discontinued     2.25 g 100 mL/hr over 30 Minutes Intravenous 4 times per day  02/04/15 0859 02/04/15 0905   02/04/15 1200  piperacillin-tazobactam (ZOSYN) IVPB 3.375 g  Status:  Discontinued     3.375 g 12.5 mL/hr over 240 Minutes Intravenous Every 8 hours 02/04/15 0905 02/07/15 0750   02/02/15 2000  fluconazole (DIFLUCAN) tablet 100 mg     100 mg Oral Every  24 hours 02/02/15 1908 02/08/15 2041   02/02/15 1800  fluconazole (DIFLUCAN) tablet 100 mg  Status:  Discontinued     100 mg Oral Daily 02/02/15 1545 02/02/15 1901   01/31/15 1000  fluconazole (DIFLUCAN) tablet 100 mg  Status:  Discontinued     100 mg Oral Daily 01/31/15 0727 02/02/15 1313   01/29/15 1700  piperacillin-tazobactam (ZOSYN) IVPB 2.25 g  Status:  Discontinued     2.25 g 100 mL/hr over 30 Minutes Intravenous Every 8 hours 01/29/15 0845 02/04/15 0859   01/24/15 2200  vancomycin (VANCOCIN) IVPB 1000 mg/200 mL premix     1,000 mg 200 mL/hr over 60 Minutes Intravenous  Once 01/24/15 1349 01/24/15 2301   01/21/15 2000  vancomycin (VANCOCIN) 1,500 mg in sodium chloride 0.9 % 500 mL IVPB  Status:  Discontinued     1,500 mg 250 mL/hr over 120 Minutes Intravenous Every 24 hours 01/21/15 1048 01/23/15 1726   01/20/15 2000  vancomycin (VANCOCIN) 1,250 mg in sodium chloride 0.9 % 250 mL IVPB  Status:  Discontinued     1,250 mg 166.7 mL/hr over 90 Minutes Intravenous Every 24 hours 01/19/15 1945 01/20/15 0843   01/20/15 2000  vancomycin (VANCOCIN) 1,250 mg in sodium chloride 0.9 % 250 mL IVPB  Status:  Discontinued     1,250 mg 166.7 mL/hr over 90 Minutes Intravenous Every 24 hours 01/20/15 1519 01/21/15 1048   01/20/15 0000  piperacillin-tazobactam (ZOSYN) IVPB 3.375 g  Status:  Discontinued     3.375 g 12.5 mL/hr over 240 Minutes Intravenous Every 8 hours 01/19/15 1946 01/29/15 0845   01/19/15 1715  vancomycin (VANCOCIN) 2,500 mg in sodium chloride 0.9 % 500 mL IVPB     2,500 mg 250 mL/hr over 120 Minutes Intravenous  Once 01/19/15 1707 01/19/15 2159   01/19/15 1715  piperacillin-tazobactam (ZOSYN) IVPB  3.375 g     3.375 g 100 mL/hr over 30 Minutes Intravenous  Once 01/19/15 1707 01/19/15 1900      Assessment/Plan: Wean Neo as tolerated Agree with NG tube to LIWS. Cont reglan. Mild ileus. Cont TPN. Will consider repeat CT Wednesday Perc drain in GB fossa hematoma not putting out anything.  Repeat cx from GB fossa hematoma - grew NTD On vanc, merropenem, eraxis. - will defer to ID about length, weaning of abx Wean steroids - defer to CCM VTE prophylaxis - SCDs, start subcu heparin, monitor for bleeding.   [name]. [name], MD, FACS General, Bariatric, & Minimally Invasive Surgery [facility], [place]   LOS: 27 days    Larry [name] 02/15/2015

## 2015-02-15 NOTE — Progress Notes (Addendum)
PARENTERAL NUTRITION CONSULT NOTE   Pharmacy Consult for TPN Indication: Prolonged ileus  No Known Allergies  Patient Measurements: Height: 5\' 6"  (167.6 cm) Weight: 259 lb 14.8 oz (117.9 kg) IBW/kg (Calculated) : 63.8 Adjusted Body Weight: 78kg Usual Weight:   Vital Signs: Temp: 98.4 F (36.9 C) (08/30 0000) Temp Source: Oral (08/30 0000) BP: 99/52 mmHg (08/30 0900) Pulse Rate: 107 (08/30 0900) Intake/Output from previous day: 08/29 0701 - 08/30 0700 In: 5593.3 [I.V.:2443.3; IV Piggyback:500; TPN:2400] Out: 5564 [Urine:160; Emesis/NG output:50; Drains:5] Intake/Output from this shift: Total I/O In: 267 [I.V.:167; TPN:100] Out: 439 [Urine:6; Other:433]  Labs:  Recent Labs  02/13/15 0415  02/14/15 0530  02/14/15 2252 02/15/15 0430 02/15/15 0622  WBC 23.8*  --  25.1*  --   --  15.5*  --   HGB 8.7*  < > 7.6*  < > 17.0 7.0* 8.5*  HCT 27.6*  < > 23.8*  < > 50.0 22.6* 25.0*  PLT 160  --  156  --   --  114*  --   APTT 30  --  30  --   --  26  --   < > = values in this interval not displayed.   Recent Labs  02/13/15 0415  02/13/15 1650  02/14/15 0500 02/14/15 0530  02/14/15 2252 02/15/15 0430 02/15/15 0622  NA 134*  134*  < > 135  < >  --  135  137  < > 139 136 138  K 4.1  4.1  < > 4.0  < >  --  4.3  4.3  < > 3.9 4.5 4.0  CL 87*  86*  < > 90*  < >  --  94*  93*  < > 91* 93* 91*  CO2 36*  36*  < > 35*  --   --  36*  36*  --   --  36*  --   GLUCOSE 450*  445*  < > 339*  < >  --  150*  146*  < > 196* 184* 235*  BUN 33*  34*  < > 41*  < >  --  38*  38*  < > 30* 41* 30*  CREATININE 2.14*  2.23*  < > 2.32*  < >  --  2.06*  2.06*  < > 1.50* 1.90* 1.40*  CALCIUM 9.6  9.5  < > 9.4  --   --  8.1*  8.2*  --   --  7.3*  --   MG 2.0  --   --   --   --  2.0  --   --  2.0  --   PHOS 3.1  3.1  --  3.1  --   --  3.1  3.1  --   --  2.5  2.5  --   PROT 6.4*  --   --   --   --  6.5  --   --   --   --   ALBUMIN 1.9*  2.0*  --  2.0*  --   --  1.9*  1.9*  --    --  1.7*  --   AST 36  --   --   --   --  55*  --   --   --   --   ALT 27  --   --   --   --  33  --   --   --   --  ALKPHOS 107  --   --   --   --  100  --   --   --   --   BILITOT 1.6*  --   --   --   --  2.0*  --   --   --   --   PREALBUMIN  --   --   --   --  10.0*  --   --   --   --   --   TRIG  --   --   --   --  175*  --   --   --   --   --   < > = values in this interval not displayed. Estimated Creatinine Clearance: 61 mL/min (by C-G formula based on Cr of 1.4).    Recent Labs  02/14/15 2231 02/14/15 2337 02/15/15 0042  GLUCAP 219* 124* 114*    Insulin Requirements:  Insulin gtt started 8/28 AM + 80 units regular insulin provided by TPN - stress dose hydrocortisone started 8/27 - calcium gluconate drip mixed in dextrose  Current Nutrition: NPO, unable to tolerate TF  IVF: NS KVO  Central access: PICC  TPN start date:  8/22  ASSESSMENT                                                                                                          HPI: 35 YOM presents with worsening abdominal pain on 8/3.  Recent cholecystectomy on 01/04/2015 with drain removed 2 weeks prior to admission. Found to have GB fossa fluid collection (infected hematoma) and abscess. IR placed GB fossa drain.  Pharmacy asked to start TPN 8/22 for prolonged ileus and interment vomiting prohibiting use of enteral nutrition  Significant events:  8/3 septic shock from infected GB fossa 8/22 orders to start TPN 8/23 Start CRRT 8/25 intubated 8/29 extubated  Today, 02/15/2015:    Glucose - on insulin infusion for past 48 hours. Has been unable to meet criteria to transition off of insulin infusion.  Electrolytes:  K+ WNL  Mg/Phos WNL but Phos is on the low end of therapeutic range - monitor  HypoCa on continuous CaGluc infusion.  Renal - oliguric AKI. CRRT 8/23. Making some urine now.  24h I/O = +132 ml,   SCr overall improving (1.4)  LFTs - AST/ALT, Alk Phos ok, tbili slightly elevated  per 8/29 labs  TGs - 155 (8/23), 175 (8/29)  Prealbumin - 5.3 (8/22), 10 (8/29)  NUTRITIONAL GOALS                                                                                             Updated RD recs 8/30: 2000-2400  kcals, protein 120-160 g (fluid 2L/day) Clinimix 5/15 at a goal rate of 145ml/hr + 20% fat emulsion at 75ml/hr to provide: 120g/day protein, 2184Kcal/day.   PLAN                                                                                                             At 1800 today:  Continue Clinimix 5/15 (WITH electrolytes) at 136ml/hr.  Added regular insulin back into the TPN yesterday.  Insulin removed 8/28 when insulin drip started per MD instruction; however, since plan is to transition off of insulin drip as soon as possible, added insulin back into TPN to continue to control hyperglycemia.  Would prefer to add insulin to TPN rather than increase Lantus dose in case TPN is abruptly stopped.  Increase regular insulin in TPN so that patient receives 120 units of regular insulin over 24 hour infusion.  CCM tapering stress dose steroids.  Resume 20% lipid emulsion at 10 ml/hr.  Held lipids for first week of ICU admission.   TPN to contain standard multivitamins and trace elements.  Daily renal function panel, Mg and Phos ordered per MD   F/u daily.  F/u plan for enteral feeding  Hershal Coria, PharmD, BCPS Pager: [phone] 02/15/2015 9:57 AM

## 2015-02-15 NOTE — Progress Notes (Addendum)
Nutrition Follow-up  DOCUMENTATION CODES:   Severe malnutrition in context of acute illness/injury, Morbid obesity  INTERVENTION:   Await decision of PO vs. Enteral nutrition  TPN per Pharmacy -Recommend when medically appropriate initiate trickle feeds of Nepro Carb Steady @ 10 ml/hr. Will provide 432 kcal, 19g protein and 175 ml of H2O.  TF recommendations: - If Panda vs PEG able to be placed, recommend Nepro @ 35 mL/hr with 60 mL Prostat TID which will provide 2112 kcal, 158 grams protein, and 610 mL free water  -RD to continue to monitor   NUTRITION DIAGNOSIS:   Inadequate oral intake related to lethargy/confusion as evidenced by meal completion < 25%.  Ongoing.  GOAL:   Patient will meet greater than or equal to 90% of their needs  Meeting 100% of needs.  MONITOR:   Labs, Weight trends, Skin, I & O's, Other (Comment) (TPN regimen)  ASSESSMENT:   69 y/o admitted in 10/05/2012 with Sepsis, hypotension, acute renal failure and acute cholecystitis. He had been place on Eliquis for his AF by the [place]. He had an MI 2 years ago and did not follow up with his cardiologist. He was acutely ill and underwent a percutaneous drain placement on 10/06/14 by IR. He went home on 10/11/14. He was readmitted on 12/30/14 and underwent laparoscopic cholecystectomy with IOC. He was left with a drain in place and the site had "SNOW' placed in the GB fossa for hemostasis after the procedure. Drain was removed last week but there was some brusing at the site. He is transferred from [facility] with complaints of increased abdominal pain.  Pt was intubated from 8/26 - 8/29. Continues on CRRT.  CBGs elevated today. Pt awaiting speech eval later today.  Awaiting decision by surgery if pt will be started on diet or will have post pyloric tube placed. TF recommendations provided above.  Plan per Pharmacy: At 1800 today:  Continue Clinimix 5/15 (WITH electrolytes) at 137m/hr - this is not  providing 100% protein needs but do not want to worsen fluid overload or hyperglycemia - as discussed with Dr. FTitus Mould8/28.  Added regular insulin back into the TPN yesterday..Marland KitchenMarland Kitchenould prefer to add insulin to TPN rather than increase Lantus dose in case TPN is abruptly stopped. Increase regular insulin in TPN so that patient receives 120 units of regular insulin over 24 hour infusion. CCM tapering stress dose steroids.  Resume 20% lipid emulsion at 10 ml/hr. Held lipids for first week of ICU admission.   F/u plan for enteral feeding  Goal: Clinimix 5/15 at a goal rate of 1021mhr + 20% lipid emulsion at 10 ml/hr to provide: 120g/day protein, 2184 Kcal/day.  100% of needs will be met when lipids added today.  Labs reviewed: CBGs: 170-185 Elevated BUN & Creatinine  Diet Order:  Diet NPO time specified Except for: Ice Chips .TPN (CLINIMIX-E) Adult TPN (CLINIMIX-E) Adult  Skin:  Wound (see comment) (abdominal incision)  Last BM:  8/29  Height:   Ht Readings from Last 1 Encounters:  01/20/15 _0  (1.676 m)    Weight:   Wt Readings from Last 1 Encounters:  02/15/15 259 lb 14.8 oz (117.9 kg)    Ideal Body Weight:  64.54 kg (kg)  BMI:  Body mass index is 41.97 kg/(m^2).  Estimated Nutritional Needs:   Kcal:  2000-2400  Protein:  120-160g  Fluid:  2L/day  EDUCATION NEEDS:   No education needs identified at this time  LiClayton BiblesMS, RD, LDN Pager: [phone]  Hours Pager: [phone]

## 2015-02-15 NOTE — Evaluation (Signed)
Physical Therapy Evaluation Patient Details Name: Larry [name] MRN: [id number] DOB: 1945/11/13 Today's Date: 02/15/2015   History of Present Illness  69 year old male w/ CAF on elliquis recently underwent lap chole on 7/14. Was discharged to home w/drain. Drain removed about a week before presentation to ER. Admitted directly  from [facility] on 8/3 w/ septic shock which was likely due to infected hematoma in the GB fossa.  new perc drain placed on 8/4, when he remained hypotensive Pt undergoing CRRT.   Clinical Impression  Pt admitted with above diagnosis. Pt currently with functional limitations due to the deficits listed below (see PT Problem List). +2 mod assist for supine to sit and to pivot to recliner with RW, HR up to 148 with activity, resolved quickly with rest.  Pt will benefit from skilled PT to increase their independence and safety with mobility to allow discharge to the venue listed below.       Follow Up Recommendations SNF;Supervision/Assistance - 24 hour    Equipment Recommendations  Rolling walker with 5" wheels    Recommendations for Other Services       Precautions / Restrictions Precautions Precautions: Fall Precaution Comments: JP drain, monitor HR, multiple lines Restrictions Weight Bearing Restrictions: No      Mobility  Bed Mobility Overal bed mobility: Needs Assistance Bed Mobility: Supine to Sit     Supine to sit: HOB elevated;Mod assist     General bed mobility comments: assist to get legs to EOB and to raise trunk, HOB up 50*  Transfers Overall transfer level: Needs assistance Equipment used: Rolling walker (2 wheeled) Transfers: Sit to/from [employer] Sit to Stand: Mod assist;+2 physical assistance;+2 safety/equipment;From elevated surface Stand pivot transfers: Mod assist;+2 safety/equipment       General transfer comment: Assist to rise, stabilize, control descent. VCs safety, hand placement. HR 114 at  rest, 135 in sitting, 148 with transfer.  Ambulation/Gait                Stairs            Wheelchair Mobility    Modified Rankin (Stroke Patients Only)       Balance   Sitting-balance support: Feet supported;Bilateral upper extremity supported Sitting balance-Leahy Scale: Fair       Standing balance-Leahy Scale: Poor                               Pertinent Vitals/Pain Pain Assessment: No/denies pain    Home Living Family/patient expects to be discharged to:: Unsure Living Arrangements: Spouse/significant other Available Help at Discharge: Family Type of Home: House Home Access: Level entry     Home Layout: One level   Additional Comments: unsure of how much assistance is available to patient at home    Prior Function           Comments: uncertain, was at home after recent DC from hospital, PLOF unknown     Hand Dominance   Dominant Hand: Right    Extremity/Trunk Assessment   Upper Extremity Assessment: Defer to OT evaluation       LUE Deficits / Details: pain with L elbow flexion, can only get it to about 80 degrees of flexion before patient unable to tolerate. Patient also has difficulty using L arm for functional mobility and ADLs due to pain.   Lower Extremity Assessment: Generalized weakness      Cervical /  Trunk Assessment: Normal  Communication   Communication: No difficulties  Cognition Arousal/Alertness: Awake/alert Behavior During Therapy: Anxious Overall Cognitive Status: Impaired/Different from baseline (oriented but makes some odd comments) Area of Impairment: Safety/judgement                    General Comments      Exercises        Assessment/Plan    PT Assessment Patient needs continued PT services  PT Diagnosis Difficulty walking;Generalized weakness;Altered mental status   PT Problem List Decreased strength;Decreased activity tolerance;Decreased mobility;Obesity;Decreased knowledge  of precautions;Decreased safety awareness;Decreased knowledge of use of DME  PT Treatment Interventions DME instruction;Gait training;Functional mobility training;Therapeutic activities;Patient/family education   PT Goals (Current goals can be found in the Care Plan section) Acute Rehab PT Goals Patient Stated Goal: to get NG tube out PT Goal Formulation: With patient Time For Goal Achievement: 03/01/15 Potential to Achieve Goals: Fair    Frequency Min 3X/week   Barriers to discharge        Co-evaluation               End of Session   Activity Tolerance: Patient limited by fatigue Patient left: with call bell/phone within reach;with nursing/sitter in room;in chair;with family/visitor present Nurse Communication: Mobility status         Time: 4098-1191 PT Time Calculation (min) (ACUTE ONLY): 28 min   Charges:   PT Evaluation $Initial PT Evaluation Tier I: 1 Procedure PT Treatments $Therapeutic Activity: 8-22 mins   PT G Codes:        Tamala Ser 02/15/2015, 12:51 PM [phone]

## 2015-02-15 NOTE — Progress Notes (Signed)
[facility] ROUNDING NOTE   Subjective:   Interval History:  NGT placed with bilious effluent   Objective:  Vital signs in last 24 hours:  Temp:  [98.4 F (36.9 C)-98.9 F (37.2 C)] 98.4 F (36.9 C) (08/30 0000) Pulse Rate:  [28-127] 124 (08/30 1130) Resp:  [14-41] 18 (08/30 1130) BP: (66-133)/(10-104) 91/49 mmHg (08/30 1130) SpO2:  [92 %-100 %] 95 % (08/30 1130) Weight:  [117.9 kg (259 lb 14.8 oz)] 117.9 kg (259 lb 14.8 oz) (08/30 0456)  Weight change: 1.5 kg (3 lb 4.9 oz) Filed Weights   02/13/15 0500 02/14/15 0500 02/15/15 0456  Weight: 117.4 kg (258 lb 13.1 oz) 116.4 kg (256 lb 9.9 oz) 117.9 kg (259 lb 14.8 oz)    Intake/Output: I/O last 3 completed shifts: In: 8991.4 [I.V.:4416.4; Other:250; NG/GT:60; IV Piggyback:665] Out: 9010 [Urine:196; Emesis/NG output:150; Drains:5; Other:8659]   Intake/Output this shift:  Total I/O In: 451.8 [I.V.:251.8; TPN:200] Out: 444 [Urine:11; Other:433]  CVS- RRR RS- CTA ABD- distended  BS hypoactive EXT- no edema   Basic Metabolic Panel:  Recent Labs Lab 02/11/15 0600  02/12/15 0650  02/12/15 1800  02/13/15 0415  02/13/15 1230  02/13/15 1650  02/14/15 0530  02/14/15 2252 02/15/15 0430 02/15/15 0622 02/15/15 0916 02/15/15 0946  NA 131*  < > 131*  < > 132*  < > 134*  134*  < > 134*  < > 135  < > 135  137  < > 139 136 138 136 139  K 4.2  < > 4.2  < > 4.0  < > 4.1  4.1  < > 4.4  < > 4.0  < > 4.3  4.3  < > 3.9 4.5 4.0 4.2 4.1  CL 100*  < > 94*  < > 92*  < > 87*  86*  < > 89*  < > 90*  < > 94*  93*  < > 91* 93* 91* 91* 89*  CO2 23  < > 30  --  29  --  36*  36*  --  37*  --  35*  --  36*  36*  --   --  36*  --   --   --   GLUCOSE 275*  < > 327*  < > 391*  < > 450*  445*  < > 407*  < > 339*  < > 150*  146*  < > 196* 184* 235* 186* 230*  BUN 32*  < > 36*  < > 34*  < > 33*  34*  < > 40*  < > 41*  < > 38*  38*  < > 30* 41* 30* 36* 30*  CREATININE 2.56*  < > 2.18*  < > 2.08*  < > 2.14*  2.23*  < >  2.50*  < > 2.32*  < > 2.06*  2.06*  < > 1.50* 1.90* 1.40* 1.90* 1.40*  CALCIUM 8.0*  < > 8.8*  --  9.1  --  9.6  9.5  --  9.1  --  9.4  --  8.1*  8.2*  --   --  7.3*  --   --   --   MG 2.2  --  2.2  --   --   --  2.0  --   --   --   --   --  2.0  --   --  2.0  --   --   --   PHOS 2.5  < >  3.1  3.1  --  2.6  --  3.1  3.1  --   --   --  3.1  --  3.1  3.1  --   --  2.5  2.5  --   --   --   < > = values in this interval not displayed.  Liver Function Tests:  Recent Labs Lab 02/10/15 0452  02/12/15 1800 02/13/15 0415 02/13/15 1650 02/14/15 0530 02/15/15 0430  AST 78*  --   --  36  --  55*  --   ALT 37  --   --  27  --  33  --   ALKPHOS 138*  --   --  107  --  100  --   BILITOT 1.3*  --   --  1.6*  --  2.0*  --   PROT 7.3  --   --  6.4*  --  6.5  --   ALBUMIN 2.3*  < > 1.8* 1.9*  2.0* 2.0* 1.9*  1.9* 1.7*  < > = values in this interval not displayed. No results for input(s): LIPASE, AMYLASE in the last 168 hours. No results for input(s): AMMONIA in the last 168 hours.  CBC:  Recent Labs Lab 02/11/15 0600  02/12/15 0650  02/13/15 0415  02/14/15 0530  02/14/15 2252 02/15/15 0430 02/15/15 0622 02/15/15 0916 02/15/15 0946  WBC 22.3*  --  21.6*  --  23.8*  --  25.1*  --   --  15.5*  --   --   --   NEUTROABS 20.1*  --   --   --  18.7*  --  20.5*  --   --   --   --   --   --   HGB 8.5*  < > 8.0*  < > 8.7*  < > 7.6*  < > 17.0 7.0* 8.5* 7.8* 8.5*  HCT 26.2*  < > 26.2*  < > 27.6*  < > 23.8*  < > 50.0 22.6* 25.0* 23.0* 25.0*  MCV 91.9  --  90.7  --  92.6  --  94.4  --   --  95.0  --   --   --   PLT 413*  --  250  --  160  --  156  --   --  114*  --   --   --   < > = values in this interval not displayed.  Cardiac Enzymes: No results for input(s): CKTOTAL, CKMB, CKMBINDEX, TROPONINI in the last 168 hours.  BNP: Invalid input(s): POCBNP  CBG:  Recent Labs Lab 02/15/15 0655 02/15/15 0804 02/15/15 0851 02/15/15 1000 02/15/15 1104  GLUCAP 175* 193* 185* 172* 170*     Microbiology: Results for orders placed or performed during the hospital encounter of 01/19/15  Culture, routine-abscess     Status: None   Collection Time: 01/20/15  1:00 PM  Result Value Ref Range Status   Specimen Description ABSCESS GALL BLADDER FOSSA  Final   Special Requests Normal  Final   Gram Stain   Final    ABUNDANT WBC PRESENT,BOTH PMN AND MONONUCLEAR NO SQUAMOUS EPITHELIAL CELLS SEEN NO ORGANISMS SEEN Performed at [facility]    Culture   Final    MULTIPLE ORGANISMS PRESENT, NONE PREDOMINANT Note: NO STAPHYLOCOCCUS AUREUS ISOLATED NO GROUP A STREP (S.PYOGENES) ISOLATED Performed at [facility]    Report Status 01/23/2015 FINAL  Final  Anaerobic culture     Status: None  Collection Time: 01/20/15  1:00 PM  Result Value Ref Range Status   Specimen Description ABSCESS GALL BLADDER FOSSA  Final   Special Requests Normal  Final   Gram Stain   Final    ABUNDANT WBC PRESENT,BOTH PMN AND MONONUCLEAR NO SQUAMOUS EPITHELIAL CELLS SEEN NO ORGANISMS SEEN Performed at [facility]    Culture   Final    NO ANAEROBES ISOLATED Performed at [facility]    Report Status 01/26/2015 FINAL  Final  MRSA PCR Screening     Status: None   Collection Time: 01/20/15  1:39 PM  Result Value Ref Range Status   MRSA by PCR NEGATIVE NEGATIVE Final    Comment:        The GeneXpert MRSA Assay (FDA approved for NASAL specimens only), is one component of a comprehensive MRSA colonization surveillance program. It is not intended to diagnose MRSA infection nor to guide or monitor treatment for MRSA infections. Performed at [facility]   Culture, blood (routine x 2)     Status: None   Collection Time: 01/20/15  1:50 PM  Result Value Ref Range Status   Specimen Description BLOOD RIGHT ARM  Final   Special Requests IN PEDIATRIC BOTTLE 4CC  Final   Culture   Final    NO GROWTH 5 DAYS Performed at [facility]    Report Status  01/25/2015 FINAL  Final  Culture, blood (routine x 2)     Status: None   Collection Time: 01/20/15  1:55 PM  Result Value Ref Range Status   Specimen Description BLOOD RIGHT HAND  Final   Special Requests IN PEDIATRIC BOTTLE 4CC  Final   Culture   Final    NO GROWTH 5 DAYS Performed at [facility]    Report Status 01/25/2015 FINAL  Final  Urine culture     Status: None   Collection Time: 01/30/15 11:40 AM  Result Value Ref Range Status   Specimen Description URINE, RANDOM  Final   Special Requests NONE  Final   Culture   Final    >=100,000 COLONIES/mL YEAST Performed at [facility]    Report Status 02/01/2015 FINAL  Final  Culture, body fluid-bottle     Status: None   Collection Time: 02/09/15  2:10 PM  Result Value Ref Range Status   Specimen Description FLUID PERITONEAL  Final   Special Requests BOTTLES DRAWN AEROBIC AND ANAEROBIC 10CC  Final   Culture   Final    NO GROWTH 5 DAYS Performed at [facility]    Report Status 02/14/2015 FINAL  Final  Gram stain     Status: None   Collection Time: 02/09/15  2:10 PM  Result Value Ref Range Status   Specimen Description FLUID PERITONEAL  Final   Special Requests BOTTLES DRAWN AEROBIC AND ANAEROBIC 10CC  Final   Gram Stain   Final    ABUNDANT WBC PRESENT,BOTH PMN AND MONONUCLEAR NO ORGANISMS SEEN Performed at [facility]    Report Status 02/09/2015 FINAL  Final  Culture, blood (routine x 2)     Status: None (Preliminary result)   Collection Time: 02/10/15  4:00 PM  Result Value Ref Range Status   Specimen Description BLOOD LEFT ANTECUBITAL  Final   Special Requests BOTTLES DRAWN AEROBIC ONLY 7CC  Final   Culture   Final    NO GROWTH 4 DAYS Performed at [facility]    Report Status PENDING  Incomplete  Culture, blood (routine x 2)     Status: None (Preliminary result)   Collection Time: 02/10/15  4:15 PM  Result Value Ref Range Status   Specimen Description BLOOD LEFT FOREARM   Final   Special Requests BOTTLES DRAWN AEROBIC ONLY 5CC  Final   Culture   Final    NO GROWTH 4 DAYS Performed at [facility]    Report Status PENDING  Incomplete    Coagulation Studies: No results for input(s): LABPROT, INR in the last 72 hours.  Urinalysis: No results for input(s): COLORURINE, LABSPEC, PHURINE, GLUCOSEU, HGBUR, BILIRUBINUR, KETONESUR, PROTEINUR, UROBILINOGEN, NITRITE, LEUKOCYTESUR in the last 72 hours.  Invalid input(s): APPERANCEUR    Imaging: Dg Chest Port 1 View  02/15/2015   CLINICAL DATA:  Respiratory failure  EXAM: PORTABLE CHEST - 1 VIEW  COMPARISON:  02/14/2015  FINDINGS: The endotracheal tube and nasogastric tube have been removed. The left jugular central line extends into the SVC. There is unchanged right hemidiaphragm elevation. There is continued improvement, with ongoing resolution of interstitial edema. No confluent airspace consolidation is evident.  IMPRESSION: Continued improvement. Removal of endotracheal tube and nasogastric tube.   Electronically Signed   By: Ellery Plunk M.D.   On: 02/15/2015 06:49   Dg Chest Port 1 View  02/14/2015   CLINICAL DATA:  Respiratory acidosis, coronary artery disease, MI, intubated patient.  EXAM: PORTABLE CHEST - 1 VIEW  COMPARISON:  Portable chest x-ray of February 13, 2015  FINDINGS: The lungs remain hypoinflated. Elevation of the right hemidiaphragm is stable. The interstitial markings are less conspicuous bilaterally. The cardiac silhouette remains enlarged. The pulmonary vascularity is less engorged.  The endotracheal tube tip lies 3.8 cm above the carina. The esophagogastric tube tip lies in the gastric cardia with the proximal port just distal to the GE junction the left internal jugular venous catheter tip projects over the proximal SVC. The PICC line tip projects over the junction of the middle and distal portions of the SVC. There are chronic changes associated with both shoulders.  IMPRESSION: Interval  improvement in the pulmonary interstitium consistent with resolving interstitial edema. Persistent elevation of the right hemidiaphragm and likely right basilar atelectasis or pneumonia. Support tubes in reasonable position.   Electronically Signed   By: David  Swaziland M.D.   On: 02/14/2015 07:07     Medications:   . Marland KitchenTPN (CLINIMIX-E) Adult 100 mL/hr at 02/14/15 1828  . sodium chloride 500 mL (02/14/15 0253)  . calcium gluconate infusion for CRRT 60 g (02/15/15 1000)  . Marland KitchenTPN (CLINIMIX-E) Adult     And  . fat emulsion    . insulin (NOVOLIN-R) infusion 4.9 Units/hr (02/15/15 1004)  . phenylephrine (NEO-SYNEPHRINE) Adult infusion 45 mcg/min (02/15/15 1106)  . dialysate (PRISMASATE) 1,500 mL/hr at 02/15/15 0946  . dialysis replacement fluid (prismasate) 300 mL/hr at 02/14/15 2130  . sodium citrate 2 %/dextrose 2.5% solution 3000 mL 250 mL/hr at 02/15/15 0030  . vasopressin (PITRESSIN) infusion - *FOR SHOCK* Stopped (02/15/15 0830)   . anidulafungin  100 mg Intravenous Q24H  . antiseptic oral rinse  7 mL Mouth Rinse q12n4p  . chlorhexidine  15 mL Mouth Rinse BID  . darbepoetin (ARANESP) injection - NON-DIALYSIS  200 mcg Subcutaneous Q Wed-1800  . hydrocortisone sodium succinate  50 mg Intravenous Q12H  . insulin aspart  2-6 Units Subcutaneous 6 times per day  . insulin glargine  40 Units Subcutaneous Q24H  . ipratropium  0.5 mg Nebulization Q6H  . levalbuterol  0.63 mg Nebulization Q6H  . meropenem (MERREM) IV  500 mg Intravenous Q6H  . nystatin   Topical TID  . pantoprazole (PROTONIX) IV  40 mg Intravenous Q24H  . sodium chloride  10-40 mL Intracatheter Q12H  . vancomycin  1,000 mg Intravenous Q24H   acetaminophen, fentaNYL (SUBLIMAZE) injection, heparin, heparin, iohexol, ipratropium, levalbuterol, [DISCONTINUED] ondansetron **OR** ondansetron (ZOFRAN) IV, sodium chloride  Assessment/ Plan:   AKI prolonged ATN  Will stop CRRT and will watch for recovery  Electrolytes stable  Monitor  Anemia stable  Acid base controlled   LOS: 27 Naija Troost W  :13 PM

## 2015-02-16 ENCOUNTER — Inpatient Hospital Stay (HOSPITAL_COMMUNITY): Payer: Medicare Other

## 2015-02-16 LAB — GRAM STAIN

## 2015-02-16 LAB — GLUCOSE, CAPILLARY
GLUCOSE-CAPILLARY: 127 mg/dL — AB (ref 65–99)
GLUCOSE-CAPILLARY: 141 mg/dL — AB (ref 65–99)
Glucose-Capillary: 139 mg/dL — ABNORMAL HIGH (ref 65–99)
Glucose-Capillary: 102 mg/dL — ABNORMAL HIGH (ref 65–99)
Glucose-Capillary: 93 mg/dL (ref 65–99)
Glucose-Capillary: 75 mg/dL (ref 65–99)

## 2015-02-16 LAB — CALCIUM, IONIZED: Calcium, Ionized, Serum: 3.9 mg/dL — ABNORMAL LOW (ref 4.5–5.6)

## 2015-02-16 LAB — RENAL FUNCTION PANEL
ALBUMIN: 1.7 g/dL — AB (ref 3.5–5.0)
ALBUMIN: 2.1 g/dL — AB (ref 3.5–5.0)
ANION GAP: 9 (ref 5–15)
Anion gap: 9 (ref 5–15)
BUN: 71 mg/dL — AB (ref 6–20)
BUN: 87 mg/dL — AB (ref 6–20)
CHLORIDE: 94 mmol/L — AB (ref 101–111)
CO2: 33 mmol/L — AB (ref 22–32)
CO2: 34 mmol/L — ABNORMAL HIGH (ref 22–32)
CREATININE: 3.6 mg/dL — AB (ref 0.61–1.24)
Calcium: 7.9 mg/dL — ABNORMAL LOW (ref 8.9–10.3)
Calcium: 8.1 mg/dL — ABNORMAL LOW (ref 8.9–10.3)
Chloride: 93 mmol/L — ABNORMAL LOW (ref 101–111)
Creatinine, Ser: 3.26 mg/dL — ABNORMAL HIGH (ref 0.61–1.24)
GFR calc Af Amer: 21 mL/min — ABNORMAL LOW (ref >60)
GFR calc Af Amer: 19 mL/min — ABNORMAL LOW (ref >60)
GFR calc non Af Amer: 18 mL/min — ABNORMAL LOW (ref >60)
GFR, EST NON AFRICAN AMERICAN: 16 mL/min — AB (ref >60)
GLUCOSE: 138 mg/dL — AB (ref 65–99)
GLUCOSE: 80 mg/dL (ref 65–99)
PHOSPHORUS: 5 mg/dL — AB (ref 2.5–4.6)
PHOSPHORUS: 6.2 mg/dL — AB (ref 2.5–4.6)
POTASSIUM: 4.9 mmol/L (ref 3.5–5.1)
Potassium: 4.8 mmol/L (ref 3.5–5.1)
SODIUM: 135 mmol/L (ref 135–145)
Sodium: 137 mmol/L (ref 135–145)

## 2015-02-16 LAB — BODY FLUID CELL COUNT WITH DIFFERENTIAL
Lymphs, Fluid: 2 %
MONOCYTE-MACROPHAGE-SEROUS FLUID: 22 % — AB (ref 50–90)
Neutrophil Count, Fluid: 76 % — ABNORMAL HIGH (ref 0–25)
WBC FLUID: 21420 uL — AB (ref 0–1000)

## 2015-02-16 LAB — PROTEIN, BODY FLUID

## 2015-02-16 LAB — GLUCOSE, SEROUS FLUID

## 2015-02-16 LAB — MAGNESIUM: MAGNESIUM: 2.3 mg/dL (ref 1.7–2.4)

## 2015-02-16 LAB — APTT: aPTT: 33 seconds (ref 24–37)

## 2015-02-16 LAB — PHOSPHORUS: PHOSPHORUS: 4.9 mg/dL — AB (ref 2.5–4.6)

## 2015-02-16 MED ORDER — INSULIN GLARGINE 100 UNIT/ML ~~LOC~~ SOLN
35.0000 [IU] | Freq: Every day | SUBCUTANEOUS | Status: DC
  Filled 2015-02-16: qty 0.35

## 2015-02-16 MED ORDER — IOHEXOL 300 MG/ML  SOLN: 25.0000 mL | Freq: Once | INTRAMUSCULAR | Status: DC | PRN

## 2015-02-16 MED ORDER — INSULIN ASPART 100 UNIT/ML ~~LOC~~ SOLN
0.0000 [IU] | SUBCUTANEOUS | Status: DC
  Administered 2015-02-16 – 2015-02-19 (×5): 3 [IU] via SUBCUTANEOUS
  Administered 2015-02-19: 4 [IU] via SUBCUTANEOUS
  Administered 2015-02-19: 3 [IU] via SUBCUTANEOUS
  Administered 2015-02-20 (×2): 7 [IU] via SUBCUTANEOUS
  Administered 2015-02-20: 11 [IU] via SUBCUTANEOUS
  Administered 2015-02-20: 7 [IU] via SUBCUTANEOUS
  Administered 2015-02-20: 4 [IU] via SUBCUTANEOUS
  Administered 2015-02-20: 7 [IU] via SUBCUTANEOUS
  Administered 2015-02-21 (×6): 4 [IU] via SUBCUTANEOUS
  Administered 2015-02-22: 7 [IU] via SUBCUTANEOUS
  Administered 2015-02-22: 4 [IU] via SUBCUTANEOUS
  Administered 2015-02-22 (×2): 7 [IU] via SUBCUTANEOUS
  Administered 2015-02-22: 4 [IU] via SUBCUTANEOUS
  Administered 2015-02-22 (×2): 11 [IU] via SUBCUTANEOUS
  Administered 2015-02-23 (×2): 4 [IU] via SUBCUTANEOUS
  Administered 2015-02-23: 11 [IU] via SUBCUTANEOUS
  Administered 2015-02-23 (×2): 4 [IU] via SUBCUTANEOUS
  Administered 2015-02-24: 3 [IU] via SUBCUTANEOUS
  Administered 2015-02-24: 4 [IU] via SUBCUTANEOUS

## 2015-02-16 MED ORDER — TRACE MINERALS CR-CU-MN-SE-ZN 10-1000-500-60 MCG/ML IV SOLN
INTRAVENOUS | Status: AC
  Administered 2015-02-16: 18:00:00 via INTRAVENOUS
  Filled 2015-02-16: qty 1920

## 2015-02-16 MED ORDER — FAT EMULSION 20 % IV EMUL
240.0000 mL | INTRAVENOUS | Status: AC
Start: 2015-02-16 — End: 2015-02-17
  Administered 2015-02-16: 240 mL via INTRAVENOUS
  Filled 2015-02-16: qty 250

## 2015-02-16 MED ORDER — ALBUMIN HUMAN 25 % IV SOLN
INTRAVENOUS | Status: AC
  Administered 2015-02-16: 12.5 g
  Filled 2015-02-16: qty 50

## 2015-02-16 MED ORDER — ALBUMIN HUMAN 25 % IV SOLN: 12.5000 g | Freq: Once | INTRAVENOUS | Status: DC

## 2015-02-16 MED ORDER — ALBUMIN HUMAN 25 % IV SOLN
25.0000 g | Freq: Once | INTRAVENOUS | Status: AC
  Administered 2015-02-16: 12.5 g via INTRAVENOUS
  Filled 2015-02-16: qty 50

## 2015-02-16 MED ORDER — PIPERACILLIN-TAZOBACTAM 3.375 G IVPB
3.3750 g | Freq: Three times a day (TID) | INTRAVENOUS | Status: DC
  Administered 2015-02-16 – 2015-02-23 (×20): 3.375 g via INTRAVENOUS
  Filled 2015-02-16 (×18): qty 50

## 2015-02-16 MED ORDER — INSULIN GLARGINE 100 UNIT/ML ~~LOC~~ SOLN
35.0000 [IU] | Freq: Every day | SUBCUTANEOUS | Status: DC
  Administered 2015-02-16 – 2015-02-17 (×2): 35 [IU] via SUBCUTANEOUS
  Filled 2015-02-16 (×2): qty 0.35

## 2015-02-16 MED ORDER — INSULIN GLARGINE 100 UNIT/ML ~~LOC~~ SOLN: 35.0000 [IU] | Freq: Every day | SUBCUTANEOUS | Status: DC

## 2015-02-16 MED ORDER — TRACE MINERALS CR-CU-MN-SE-ZN 10-1000-500-60 MCG/ML IV SOLN
INTRAVENOUS | Status: AC
  Filled 2015-02-16: qty 2400

## 2015-02-16 MED ORDER — FAT EMULSION 20 % IV EMUL
240.0000 mL | INTRAVENOUS | Status: AC
  Filled 2015-02-16: qty 250

## 2015-02-16 NOTE — Progress Notes (Signed)
Patient ID: Larry [name], male   DOB: Aug 16, 1945, 69 y.o.   MRN: [id number]    Referring Physician(s): CCS  Chief Complaint: Gallbladder fossa abscess  Subjective:  Pt lethargic with occ coughing; using suction to clear secretions; talks briefly  Allergies: Review of patient's allergies indicates no known allergies.  Medications: Prior to Admission medications   Medication Sig Start Date End Date Taking? Authorizing Provider  allopurinol (ZYLOPRIM) 300 MG tablet Take 300 mg by mouth daily.   Yes Historical Provider, MD  amLODipine (NORVASC) 10 MG tablet Take 10 mg by mouth every morning.    Yes Historical Provider, MD  apixaban (ELIQUIS) 5 MG TABS tablet Take 1 tablet (5 mg total) by mouth 2 (two) times daily. 10/01/14  Yes April Palumbo, MD  aspirin 81 MG tablet Take 81 mg by mouth daily.   Yes Historical Provider, MD  atorvastatin (LIPITOR) 80 MG tablet Take 80 mg by mouth at bedtime.    Yes Historical Provider, MD  Cholecalciferol (VITAMIN D PO) Take 2,000 Units by mouth daily.    Yes Historical Provider, MD  citalopram (CELEXA) 40 MG tablet Take 40 mg by mouth every morning.    Yes Historical Provider, MD  furosemide (LASIX) 40 MG tablet Take 40 mg by mouth every morning.    Yes Historical Provider, MD  glucose 4 GM chewable tablet Chew 1 tablet by mouth as needed for low blood sugar (only if BS IS BELOW 70).   Yes Historical Provider, MD  insulin glargine (LANTUS) 100 UNIT/ML injection Inject 40 Units into the skin at bedtime.   Yes Historical Provider, MD  magnesium oxide (MAG-OX) 400 MG tablet Take 400 mg by mouth 2 (two) times daily.    Yes Historical Provider, MD  metFORMIN (GLUCOPHAGE) 500 MG tablet Take 500 mg by mouth 2 (two) times daily with a meal.   Yes Historical Provider, MD  oxyCODONE-acetaminophen (PERCOCET/ROXICET) 5-325 MG per tablet Take 1-2 tablets by mouth every 4 (four) hours as needed for moderate pain. 01/05/15  Yes [name], MD  pantoprazole (PROTONIX)  40 MG tablet Take 40 mg by mouth daily.   Yes Historical Provider, MD  vitamin B-12 (CYANOCOBALAMIN) 500 MCG tablet Take 500 mcg by mouth daily.   Yes Historical Provider, MD  Liraglutide 18 MG/3ML SOPN Inject 1.2 mg into the skin daily. Pt states has been on hold since discharged 10-06-14 hospital visit    Historical Provider, MD  lisinopril (PRINIVIL,ZESTRIL) 40 MG tablet Take 40 mg by mouth 2 (two) times daily. On hold since 10-06-14    Historical Provider, MD  ondansetron (ZOFRAN) 8 MG tablet Take 8 mg by mouth 2 (two) times daily. Not taking-on hold form 10-06-14    Historical Provider, MD     Vital Signs: BP 116/53 mmHg  Pulse 108  Temp(Src) 98.4 F (36.9 C) (Oral)  Resp 26  Ht 5\' 6"  (1.676 m)  Wt 256 lb 13.4 oz (116.5 kg)  BMI 41.47 kg/m2  SpO2 93%  Physical Examlethargic; RUQ drain intact, insertion site ok, mildly tender, output about 10cc blood tinged fluid  Imaging: Ct Abdomen Pelvis Wo Contrast  02/16/2015   CLINICAL DATA:  Six weeks post laparoscopic cholecystectomy complicated by a gallbladder fossa hematoma, followup, past surgical drain that was pulled, now having vomiting and abdominal pain, unable to keep oral intake down, past history hypertension, atrial fibrillation, coronary artery disease post MI  EXAM: CT ABDOMEN AND PELVIS WITHOUT CONTRAST  TECHNIQUE: Multidetector CT imaging of the abdomen  and pelvis was performed following the standard protocol without IV contrast. Sagittal and coronal MPR images reconstructed from axial data set. Patient drank a small amount of dilute oral contrast for exam.  COMPARISON:  02/07/2015  FINDINGS: Small RIGHT pleural effusion with bibasilar atelectasis.  Subcapsular fluid collection at lateral and posterior aspects of liver extending to dome, stable.  Pigtail drainage catheter identified within a persistent large fluid collection at the gallbladder fossa 11.7 x 9.0 x 8.5 cm previously 9.7 x 9.7 x 8.8 cm.  Additional subhepatic collection  anteromedial to gallbladder collection containing air and fluid, decreased in size now measuring 5.0 x 5.0 x 4.4 cm, uncertain if communicating with the gallbladder fossa collection, previously measured 6.6 x 6.8 x 6.8 cm.  Multiple low-attenuation fluid collections without gas are seen throughout the abdomen including large amount of fluid at the gutters into the pelvis, interloop, perigastric, and anteriorly in the upper and mid abdomen, probably partially loculated at multiple sites.  Nasogastric tube extends to the pylorus.  Several foci of extraluminal gas are identified anterior to the gallbladder fossa collection, potentially related to the abscess collection and catheter drainage, without free intraperitoneal air at additional sites.  Spleen, pancreas, kidneys and adrenal glands unremarkable.  Scattered atherosclerotic calcifications.  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  Bladder decompressed by urinary bladder.  No mass, adenopathy, hernia or acute bone lesion.  IMPRESSION: Persistent large gas and fluid collection at the gallbladder fossa slightly larger than on previous exam despite pigtail drainage catheter.  Interval mild decrease in size of a collection in anteromedial to the gallbladder fossa collection, question communicating with drainage catheter.  Significant ascites, question partially loculated at sites in the upper abdomen.  Several small foci of gas are seen anterior to the gallbladder fossa collection, question related to percutaneous drainage, without additional free intraperitoneal air identified.   Electronically Signed   By: Ulyses Southward M.D.   On: 02/16/2015 11:24   Dg Chest Port 1 View  02/15/2015   CLINICAL DATA:  Respiratory failure  EXAM: PORTABLE CHEST - 1 VIEW  COMPARISON:  02/14/2015  FINDINGS: The endotracheal tube and nasogastric tube have been removed. The left jugular central line extends into the SVC. There is unchanged right hemidiaphragm  elevation. There is continued improvement, with ongoing resolution of interstitial edema. No confluent airspace consolidation is evident.  IMPRESSION: Continued improvement. Removal of endotracheal tube and nasogastric tube.   Electronically Signed   By: Ellery Plunk M.D.   On: 02/15/2015 06:49   Dg Chest Port 1 View  02/14/2015   CLINICAL DATA:  Respiratory acidosis, coronary artery disease, MI, intubated patient.  EXAM: PORTABLE CHEST - 1 VIEW  COMPARISON:  Portable chest x-ray of February 13, 2015  FINDINGS: The lungs remain hypoinflated. Elevation of the right hemidiaphragm is stable. The interstitial markings are less conspicuous bilaterally. The cardiac silhouette remains enlarged. The pulmonary vascularity is less engorged.  The endotracheal tube tip lies 3.8 cm above the carina. The esophagogastric tube tip lies in the gastric cardia with the proximal port just distal to the GE junction the left internal jugular venous catheter tip projects over the proximal SVC. The PICC line tip projects over the junction of the middle and distal portions of the SVC. There are chronic changes associated with both shoulders.  IMPRESSION: Interval improvement in the pulmonary interstitium consistent with resolving interstitial edema. Persistent elevation of the right hemidiaphragm and likely right basilar atelectasis or pneumonia. Support tubes in  reasonable position.   Electronically Signed   By: David  Swaziland M.D.   On: 02/14/2015 07:07   Dg Chest Port 1 View  02/13/2015   CLINICAL DATA:  Acute respiratory failure. Hypertension. Coronary artery disease. Diabetes. Myocardial infarction.  EXAM: PORTABLE CHEST - 1 VIEW  COMPARISON:  One day prior  FINDINGS: Endotracheal tube terminates 1.9 cm above carina. Left internal jugular line tip at high SVC. Right-sided PICC line terminates at mid to low SVC. Nasogastric terminates at the body of the stomach. Cardiomegaly accentuated by AP portable technique. Right  hemidiaphragm elevation is moderate. Suspect a small right pleural effusion. No pneumothorax. Interstitial edema is mild and similar, given differences in inspiratory effort. Mild right base volume loss without lobar consolidation.  IMPRESSION: Given differences in inspiratory effort, similar mild interstitial edema.  Probable trace right pleural fluid.   Electronically Signed   By: Jeronimo Greaves M.D.   On: 02/13/2015 08:49    Labs:  CBC:  Recent Labs  02/13/15 0415  02/14/15 0530  02/15/15 0430 02/15/15 0622 02/15/15 0916 02/15/15 0946 02/16/15 0445  WBC 23.8*  --  25.1*  --  15.5*  --   --   --  10.7*  HGB 8.7*  < > 7.6*  < > 7.0* 8.5* 7.8* 8.5* 11.0*  HCT 27.6*  < > 23.8*  < > 22.6* 25.0* 23.0* 25.0* 34.1*  PLT 160  --  156  --  114*  --   --   --  104*  < > = values in this interval not displayed.  COAGS:  Recent Labs  10/06/14 0445 01/19/15 1613 01/23/15 0754  02/13/15 0415 02/14/15 0530 02/15/15 0430 02/16/15 0445  INR 1.07 1.46 1.32  --   --   --   --   --   APTT 34 35 38*  < > 30 30 26  33  < > = values in this interval not displayed.  BMP:  Recent Labs  02/14/15 0530  02/15/15 0430  02/15/15 0916 02/15/15 0946 02/15/15 1815 02/16/15 0445  NA 135  137  < > 136  < > 136 139 136 135  K 4.3  4.3  < > 4.5  < > 4.2 4.1 4.6 4.9  CL 94*  93*  < > 93*  < > 91* 89* 93* 93*  CO2 36*  36*  --  36*  --   --   --  34* 33*  GLUCOSE 150*  146*  < > 184*  < > 186* 230* 131* 138*  BUN 38*  38*  < > 41*  < > 36* 30* 55* 71*  CALCIUM 8.1*  8.2*  --  7.3*  --   --   --  7.8* 7.9*  CREATININE 2.06*  2.06*  < > 1.90*  < > 1.90* 1.40* 2.68* 3.26*  GFRNONAA 31*  31*  --  35*  --   --   --  23* 18*  GFRAA 36*  36*  --  40*  --   --   --  26* 21*  < > = values in this interval not displayed.  LIVER FUNCTION TESTS:  Recent Labs  02/08/15 0540  02/10/15 0452  02/13/15 0415  02/14/15 0530 02/15/15 0430 02/15/15 1815 02/16/15 0445  BILITOT 1.0  --  1.3*  --   1.6*  --  2.0*  --   --   --   AST 58*  --  78*  --  36  --  55*  --   --   --   ALT 19  --  37  --  27  --  33  --   --   --   ALKPHOS 131*  --  138*  --  107  --  100  --   --   --   PROT 6.6  --  7.3  --  6.4*  --  6.5  --   --   --   ALBUMIN 2.1*  < > 2.3*  < > 1.9*  2.0*  < > 1.9*  1.9* 1.7* 1.8* 1.7*  < > = values in this interval not displayed.  Assessment and Plan:  S/p GB fossa abscess drainage 8/4, upsized 8/18; recent TPA dwells into drain;  latest CT with no sig change in size of GB fossa abscess; sig ascites present- pt underwent paracentesis today yielding 3.1 liters dark bloody fluid and portion of specimen was sent for several lab studies. BP a little soft- pt given albumin during paracentesis and findings d/w CCM. Latest hgb 11.0, plts 104k,WBC 10.7, creat up to 3.26. Prior perit/blood cx's neg. Plans per CCM, CCS, nephrology,ID.  Signed: D. Jeananne Rama 02/16/2015, 4:36 PM   I spent a total of 15 minutes  at the the patient's bedside AND on the patient's hospital floor or unit, greater than 50% of which was counseling/coordinating care for GB fossa abscess drain

## 2015-02-16 NOTE — Progress Notes (Signed)
SLP Cancellation Note  Patient Details Name: Larry [name] MRN: [id number] DOB: Oct 10, 1945   Cancelled treatment:       Reason Eval/Treat Not Completed: Other (comment) (pt remains with NG and for CT abdomen)   Donavan Burnet, MS [facility] SLP [phone]

## 2015-02-16 NOTE — Progress Notes (Signed)
[place] NOTE   Pharmacy Consult for TPN Indication: Prolonged ileus  No Known Allergies  Patient Measurements: Height: 5\' 6"  (167.6 cm) Weight: 256 lb 13.4 oz (116.5 kg) IBW/kg (Calculated) : 63.8 Adjusted Body Weight: 78kg Usual Weight:   Vital Signs: Temp: 100.3 F (37.9 C) (08/31 0400) Temp Source: Axillary (08/31 0400) BP: 91/40 mmHg (08/31 0855) Pulse Rate: 110 (08/31 0855) Intake/Output from previous day: 08/30 0701 - 08/31 0700 In: 3668.8 [I.V.:768.6; NG/GT:60; IV Piggyback:380; TPN:2460.2] Out: 5409 [Urine:255; Emesis/NG output:1050; Drains:10] Intake/Output from this shift: Total I/O In: 352.8 [I.V.:32.8; Other:20; NG/GT:30; IV Piggyback:50; TPN:220] Out: 60 [Urine:60]  Labs:  Recent Labs  02/14/15 0530  02/15/15 0430  02/15/15 0916 02/15/15 0946 02/16/15 0445  WBC 25.1*  --  15.5*  --   --   --  10.7*  HGB 7.6*  < > 7.0*  < > 7.8* 8.5* 11.0*  HCT 23.8*  < > 22.6*  < > 23.0* 25.0* 34.1*  PLT 156  --  114*  --   --   --  104*  APTT 30  --  26  --   --   --  33  < > = values in this interval not displayed.   Recent Labs  02/14/15 0500 02/14/15 0530  02/15/15 0430  02/15/15 0946 02/15/15 1815 02/16/15 0445  NA  --  135  137  < > 136  < > 139 136 135  K  --  4.3  4.3  < > 4.5  < > 4.1 4.6 4.9  CL  --  94*  93*  < > 93*  < > 89* 93* 93*  CO2  --  36*  36*  --  36*  --   --  34* 33*  GLUCOSE  --  150*  146*  < > 184*  < > 230* 131* 138*  BUN  --  38*  38*  < > 41*  < > 30* 55* 71*  CREATININE  --  2.06*  2.06*  < > 1.90*  < > 1.40* 2.68* 3.26*  CALCIUM  --  8.1*  8.2*  --  7.3*  --   --  7.8* 7.9*  MG  --  2.0  --  2.0  --   --   --  2.3  PHOS  --  3.1  3.1  --  2.5  2.5  --   --  3.2 4.9*  5.0*  PROT  --  6.5  --   --   --   --   --   --   ALBUMIN  --  1.9*  1.9*  --  1.7*  --   --  1.8* 1.7*  AST  --  55*  --   --   --   --   --   --   ALT  --  33  --   --   --   --   --   --   ALKPHOS  --  100  --   --   --    --   --   --   BILITOT  --  2.0*  --   --   --   --   --   --   PREALBUMIN 10.0*  --   --   --   --   --   --   --   TRIG 175*  --   --   --   --   --   --   --   < > =  values in this interval not displayed. Estimated Creatinine Clearance: 26 mL/min (by C-G formula based on Cr of 3.26).    Recent Labs  02/15/15 2205 02/15/15 2353 02/16/15 0405  GLUCAP 108* 127* 139*    Insulin Requirements:  Insulin gtt until 8/30 at 2100 + Lantus 40 units + 120 units regular insulin provided by TPN - stress dose hydrocortisone started 8/27, stopped 8/31 - calcium gluconate drip mixed in dextrose, stopped 8/30 am  Current Nutrition: NPO, unable to tolerate TF  IVF: NS KVO  Central access: PICC  TPN start date:  8/22  ASSESSMENT                                                                                                          HPI: 80 YOM presents with worsening abdominal pain on 8/3.  Recent cholecystectomy on 01/04/2015 with drain removed 2 weeks prior to admission. Found to have GB fossa fluid collection (infected hematoma) and abscess. IR placed GB fossa drain.  Pharmacy asked to start TPN 8/22 for prolonged ileus and interment vomiting prohibiting use of enteral nutrition  Significant events:  8/3 septic shock from infected GB fossa 8/22 orders to start TPN 8/23 Start CRRT 8/25 intubated 8/29 extubated  Today, 02/16/2015:    Glucose - able to transition off of insulin drip yesterday evening after multiple factors adjusted -> Lantus 40 units given, Steroids tapered, regular insulin in TPN increased, CRRT stopped and therefore calcium gluconate drip mixed in dextrose was stopped.  CRRT remains off, Lantus 35 units ordered qhs, steroids d/c'd, and will continue to provide insulin in TPN.  Electrolytes:  K+ WNL  Mg WNL, Phos elevated.  If dose not improve, may need to consider removing electrolytes from the TPN.  CorrCa 9.74. CaPhos product 48.7 - monitor.  Renal - oliguric AKI.  CRRT 8/23-8/30. Making some urine now.  24h I/O = +1.9L   SCr 3.26 this AM after stopping CRRT 8/30 AM  Urine output 255 ml/h24h  LFTs - AST/ALT, Alk Phos ok, tbili slightly elevated per 8/29 labs  TGs - 155 (8/23), 175 (8/29)  Prealbumin - 5.3 (8/22), 10 (8/29)  NUTRITIONAL GOALS                                                                                             Updated RD recs 8/31 (now that off CRRT): 1950-2150 kcals, protein 95-105 g (fluid 2L/day) Clinimix 5/15 at a goal rate of 89ml/hr + 20% fat emulsion at 79ml/hr to provide: 96g/day protein, 1843Kcal/day.  PLAN  At 1800 today:  Reduce Clinimix 5/15 to 80 ml/hr and change to electrolyte-free product due to elevated phosphorus.  Patient is off of CRRT, watching for recovery.  SCr and BUN increasing.  Will reduce protein supplementation now that off CRRT. Patient currently on Clinimix E 5/15 at 117ml/hr.  Will reduce the rate of current bag to 80 ml/hr now.  Will continue to add regular insulin in TPN but will reduce amount due to other factors as documented in assessment.  Will add insulin to TPN so that patient receives 40 units of regular insulin over 24 hour infusion.  Would prefer to add insulin to TPN rather than increase Lantus dose in case TPN is abruptly stopped.  CCM tapered off stress dose steroids today.  Continue 20% lipid emulsion at 10 ml/hr.  Held lipids for first week of ICU admission.  Resumed 8/30.   TPN to contain standard multivitamins and trace elements.  Daily renal function panel, Mg and Phos ordered per MD   CBG monitoring and SSI q4h  F/u daily.  Hershal Coria, PharmD, BCPS Pager: [phone] 02/16/2015 9:49 AM

## 2015-02-16 NOTE — Progress Notes (Addendum)
[facility]    Date of Admission:  01/19/2015   Total days of antibiotics 28        Day 8 meropenem/ day 7 anidulafungin/ vanco 4   ID: Larry [name] is a 69 y.o. male with septic shock due to infected hematoma s/p IR drain, protein-caloric malnutrition and aki requiring HD Principal Problem:   Septic shock Active Problems:   Anticoagulated   Paroxysmal a-fib   Acute blood loss anemia   Catheter-associated urinary tract infection   Intra-abdominal infection   Candidiasis of urogenital site   Acute respiratory failure with hypoxia   Infected hematoma GB fossa of liver   Leukocytosis   Diabetes mellitus with renal complications   Anemia of chronic disease   Hypokalemia   Hypomagnesemia   Acute renal failure   Protein-calorie malnutrition, severe   AKI (acute kidney injury)   Encounter for palliative care   Acute encephalopathy    Subjective:  He remains off of CRRT over the last day, has UOP/ 24hr. He is afebrile, leukocytosis improving. Had repeat abd CT showing little improvement in RUQ fluid collection, large ascites. Underwent 3.1L paracentesis, with albumin infusion. Small bowel movement reported by nursing  Medications:  . anidulafungin  100 mg Intravenous Q24H  . antiseptic oral rinse  7 mL Mouth Rinse q12n4p  . chlorhexidine  15 mL Mouth Rinse BID  . darbepoetin (ARANESP) injection - NON-DIALYSIS  200 mcg Subcutaneous Q Wed-1800  . heparin subcutaneous  5,000 Units Subcutaneous 3 times per day  . insulin aspart  0-20 Units Subcutaneous 6 times per day  . insulin glargine  35 Units Subcutaneous QHS  . ipratropium  0.5 mg Nebulization Q6H  . levalbuterol  0.63 mg Nebulization Q6H  . meropenem (MERREM) IV  500 mg Intravenous Q12H  . nystatin   Topical TID  . pantoprazole (PROTONIX) IV  40 mg Intravenous Q24H  . sodium chloride  10-40 mL Intracatheter Q12H    Objective: Vital signs in last 24 hours: Temp:  [98.4 F (36.9  C)-100.3 F (37.9 C)] 98.4 F (36.9 C) (08/31 1200) Pulse Rate:  [53-123] 108 (08/31 1400) Resp:  [16-32] 26 (08/31 1400) BP: (87-131)/(32-116) 116/53 mmHg (08/31 1400) SpO2:  [88 %-100 %] 93 % (08/31 1400) Weight:  [256 lb 13.4 oz (116.5 kg)] 256 lb 13.4 oz (116.5 kg) (08/31 0441)  Physical Exam  Constitutional: He appears chronically ill.alert, conversant HEENT= dry oral mucosa, no thrush Neck: left HD cath IJ Cardiovascular: Normal rate, regular rhythm and normal heart sounds. Exam reveals no gallop and no friction rub.  No murmur heard.  Pulmonary/Chest: Effort normal and breath sounds normal. No respiratory distress. He has no wheezes.  Abdominal: mildly distended, decreased bowel sounds Ext: 2+ edema c/w anasarca Skin: Skin is warm and dry. No rash noted. No erythema.     Lab Results  Recent Labs  02/15/15 0430  02/15/15 0946 02/15/15 1815 02/16/15 0445  WBC 15.5*  --   --   --  10.7*  HGB 7.0*  < > 8.5*  --  11.0*  HCT 22.6*  < > 25.0*  --  34.1*  NA 136  < > 139 136 135  K 4.5  < > 4.1 4.6 4.9  CL 93*  < > 89* 93* 93*  CO2 36*  --   --  34* 33*  BUN 41*  < > 30* 55* 71*  CREATININE 1.90*  < > 1.40* 2.68* 3.26*  < > =  values in this interval not displayed. Liver Panel  Recent Labs  02/14/15 0530  02/15/15 1815 02/16/15 0445  PROT 6.5  --   --   --   ALBUMIN 1.9*  1.9*  < > 1.8* 1.7*  AST 55*  --   --   --   ALT 33  --   --   --   ALKPHOS 100  --   --   --   BILITOT 2.0*  --   --   --   < > = values in this interval not displayed.  Studies/Results: Ct Abdomen Pelvis Wo Contrast  02/16/2015   CLINICAL DATA:  Six weeks post laparoscopic cholecystectomy complicated by a gallbladder fossa hematoma, followup, past surgical drain that was pulled, now having vomiting and abdominal pain, unable to keep oral intake down, past history hypertension, atrial fibrillation, coronary artery disease post MI  EXAM: CT ABDOMEN AND PELVIS WITHOUT CONTRAST  TECHNIQUE:  Multidetector CT imaging of the abdomen and pelvis was performed following the standard protocol without IV contrast. Sagittal and coronal MPR images reconstructed from axial data set. Patient drank a small amount of dilute oral contrast for exam.  COMPARISON:  02/07/2015  FINDINGS: Small RIGHT pleural effusion with bibasilar atelectasis.  Subcapsular fluid collection at lateral and posterior aspects of liver extending to dome, stable.  Pigtail drainage catheter identified within a persistent large fluid collection at the gallbladder fossa 11.7 x 9.0 x 8.5 cm previously 9.7 x 9.7 x 8.8 cm.  Additional subhepatic collection anteromedial to gallbladder collection containing air and fluid, decreased in size now measuring 5.0 x 5.0 x 4.4 cm, uncertain if communicating with the gallbladder fossa collection, previously measured 6.6 x 6.8 x 6.8 cm.  Multiple low-attenuation fluid collections without gas are seen throughout the abdomen including large amount of fluid at the gutters into the pelvis, interloop, perigastric, and anteriorly in the upper and mid abdomen, probably partially loculated at multiple sites.  Nasogastric tube extends to the pylorus.  Several foci of extraluminal gas are identified anterior to the gallbladder fossa collection, potentially related to the abscess collection and catheter drainage, without free intraperitoneal air at additional sites.  Spleen, pancreas, kidneys and adrenal glands unremarkable.  Scattered atherosclerotic calcifications.  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  Bladder decompressed by urinary bladder.  No mass, adenopathy, hernia or acute bone lesion.  IMPRESSION: Persistent large gas and fluid collection at the gallbladder fossa slightly larger than on previous exam despite pigtail drainage catheter.  Interval mild decrease in size of a collection in anteromedial to the gallbladder fossa collection, question communicating with drainage catheter.   Significant ascites, question partially loculated at sites in the upper abdomen.  Several small foci of gas are seen anterior to the gallbladder fossa collection, question related to percutaneous drainage, without additional free intraperitoneal air identified.   Electronically Signed   By: Ulyses Southward M.D.   On: 02/16/2015 11:24   Dg Chest Port 1 View  02/15/2015   CLINICAL DATA:  Respiratory failure  EXAM: PORTABLE CHEST - 1 VIEW  COMPARISON:  02/14/2015  FINDINGS: The endotracheal tube and nasogastric tube have been removed. The left jugular central line extends into the SVC. There is unchanged right hemidiaphragm elevation. There is continued improvement, with ongoing resolution of interstitial edema. No confluent airspace consolidation is evident.  IMPRESSION: Continued improvement. Removal of endotracheal tube and nasogastric tube.   Electronically Signed   By: Rosey Bath.D.  On: 02/15/2015 06:49     Assessment/Plan:  Intra-abdominal infection with infected hematoma post choelcystectomy =  We had expanded originally coverage empirically but new pathogens found. (hx of ecoli noted in April). We will de-escalate back down to piptazo for GNR coverage, keep anidulafungin( day #7) and vanco (#4) for now. Leukocytosis has improved over the last 3-4 days.  aki = continue to monitor off of CRRT. Will need to ensure all antibiotics are renally dosed  Leukocytosis = improved on broad spectrum  Secondary peritonitis = likely related to 9 X 9 cm hematoma/fluid collection. Cultures are negative. He underwent large volume paracentesis, dark blood noted suggestive of suspected bleed and sending fluid for cell count and culture.  Severe protein-caloric malnutrition = continue with tpn for now but discussion for swallow eval vs. Post-pyloric dophoff once resolved from his ileus  Ileus = currently managed with NG.  Hypotension = still pressor dependent, though weaning, managed by PCCM  Dr.  Orvan Falconer to see tomorrow for further recs  Spent 35 min with greater than 50% in patient reviewing records, counseling family, discussing care with nursing    [facility], [facility] Cell: [phone] Pager: [phone]  02/16/2015, 3:23 PM

## 2015-02-16 NOTE — Progress Notes (Addendum)
ANTIBIOTIC CONSULT NOTE  Pharmacy Consult for Meropenem, Vancomycin Indication: infected hematoma s/p cholecystectomy/IAI  No Known Allergies  Patient Measurements: Height: 5\' 6"  (167.6 cm) Weight: 256 lb 13.4 oz (116.5 kg) IBW/kg (Calculated) : 63.8   Vital Signs: Temp: 100.3 F (37.9 C) (08/31 0400) Temp Source: Axillary (08/31 0400) BP: 91/40 mmHg (08/31 0855) Pulse Rate: 110 (08/31 0855) Intake/Output from previous day: 08/30 0701 - 08/31 0700 In: 3668.8 [I.V.:768.6; NG/GT:60; IV Piggyback:380; TPN:2460.2] Out: 1748 [Urine:255; Emesis/NG output:1050; Drains:10] Intake/Output from this shift: Total I/O In: 302.8 [I.V.:32.8; Other:20; NG/GT:30; TPN:220] Out: 60 [Urine:60]  Labs:  Recent Labs  02/14/15 0530  02/15/15 0430  02/15/15 0916 02/15/15 0946 02/15/15 1815 02/16/15 0445  WBC 25.1*  --  15.5*  --   --   --   --  10.7*  HGB 7.6*  < > 7.0*  < > 7.8* 8.5*  --  11.0*  PLT 156  --  114*  --   --   --   --  104*  CREATININE 2.06*  2.06*  < > 1.90*  < > 1.90* 1.40* 2.68* 3.26*  < > = values in this interval not displayed. Estimated Creatinine Clearance: 26 mL/min (by C-G formula based on Cr of 3.26). No results for input(s): VANCOTROUGH, VANCOPEAK, VANCORANDOM, GENTTROUGH, GENTPEAK, GENTRANDOM, TOBRATROUGH, TOBRAPEAK, TOBRARND, AMIKACINPEAK, AMIKACINTROU, AMIKACIN in the last 72 hours.   Microbiology: Recent Results (from the past 720 hour(s))  Culture, routine-abscess     Status: None   Collection Time: 01/20/15  1:00 PM  Result Value Ref Range Status   Specimen Description ABSCESS GALL BLADDER FOSSA  Final   Special Requests Normal  Final   Gram Stain   Final    ABUNDANT WBC PRESENT,BOTH PMN AND MONONUCLEAR NO SQUAMOUS EPITHELIAL CELLS SEEN NO ORGANISMS SEEN Performed at [facility]    Culture   Final    MULTIPLE ORGANISMS PRESENT, NONE PREDOMINANT Note: NO STAPHYLOCOCCUS AUREUS ISOLATED NO GROUP A STREP (S.PYOGENES) ISOLATED Performed at  [facility]    Report Status 01/23/2015 FINAL  Final  Anaerobic culture     Status: None   Collection Time: 01/20/15  1:00 PM  Result Value Ref Range Status   Specimen Description ABSCESS GALL BLADDER FOSSA  Final   Special Requests Normal  Final   Gram Stain   Final    ABUNDANT WBC PRESENT,BOTH PMN AND MONONUCLEAR NO SQUAMOUS EPITHELIAL CELLS SEEN NO ORGANISMS SEEN Performed at [facility]    Culture   Final    NO ANAEROBES ISOLATED Performed at [facility]    Report Status 01/26/2015 FINAL  Final  MRSA PCR Screening     Status: None   Collection Time: 01/20/15  1:39 PM  Result Value Ref Range Status   MRSA by PCR NEGATIVE NEGATIVE Final    Comment:        The GeneXpert MRSA Assay (FDA approved for NASAL specimens only), is one component of a comprehensive MRSA colonization surveillance program. It is not intended to diagnose MRSA infection nor to guide or monitor treatment for MRSA infections. Performed at [facility]   Culture, blood (routine x 2)     Status: None   Collection Time: 01/20/15  1:50 PM  Result Value Ref Range Status   Specimen Description BLOOD RIGHT ARM  Final   Special Requests IN PEDIATRIC BOTTLE 4CC  Final   Culture   Final    NO GROWTH 5 DAYS Performed at [facility]    Report Status 01/25/2015 FINAL  Final  Culture, blood (routine x 2)     Status: None   Collection Time: 01/20/15  1:55 PM  Result Value Ref Range Status   Specimen Description BLOOD RIGHT HAND  Final   Special Requests IN PEDIATRIC BOTTLE 4CC  Final   Culture   Final    NO GROWTH 5 DAYS Performed at [facility]    Report Status 01/25/2015 FINAL  Final  Urine culture     Status: None   Collection Time: 01/30/15 11:40 AM  Result Value Ref Range Status   Specimen Description URINE, RANDOM  Final   Special Requests NONE  Final   Culture   Final    >=100,000 COLONIES/mL YEAST Performed at [facility]    Report  Status 02/01/2015 FINAL  Final  Culture, body fluid-bottle     Status: None   Collection Time: 02/09/15  2:10 PM  Result Value Ref Range Status   Specimen Description FLUID PERITONEAL  Final   Special Requests BOTTLES DRAWN AEROBIC AND ANAEROBIC 10CC  Final   Culture   Final    NO GROWTH 5 DAYS Performed at [facility]    Report Status 02/14/2015 FINAL  Final  Gram stain     Status: None   Collection Time: 02/09/15  2:10 PM  Result Value Ref Range Status   Specimen Description FLUID PERITONEAL  Final   Special Requests BOTTLES DRAWN AEROBIC AND ANAEROBIC 10CC  Final   Gram Stain   Final    ABUNDANT WBC PRESENT,BOTH PMN AND MONONUCLEAR NO ORGANISMS SEEN Performed at [facility]    Report Status 02/09/2015 FINAL  Final  Culture, blood (routine x 2)     Status: None   Collection Time: 02/10/15  4:00 PM  Result Value Ref Range Status   Specimen Description BLOOD LEFT ANTECUBITAL  Final   Special Requests BOTTLES DRAWN AEROBIC ONLY 7CC  Final   Culture   Final    NO GROWTH 5 DAYS Performed at [facility]    Report Status 02/15/2015 FINAL  Final  Culture, blood (routine x 2)     Status: None   Collection Time: 02/10/15  4:15 PM  Result Value Ref Range Status   Specimen Description BLOOD LEFT FOREARM  Final   Special Requests BOTTLES DRAWN AEROBIC ONLY 5CC  Final   Culture   Final    NO GROWTH 5 DAYS Performed at [facility]    Report Status 02/15/2015 FINAL  Final   Assessment: 63 yoM presents with increased abdominal pain since cholecystectomy last month. Patient was discharged with a drain in place which was removed last week.  Patient transferred from [facility] on 8/3 with septic shock likely due to infected hematoma in the GB fossa. Antibiotics started for intra-abdominal infection, possible UTI.   8/3 >> Vanc >>  8/10 8/3 >> Zosyn >> 8/24; 8/31 8/17 >> Diflucan >> 8/23 8/24 >> Meropenem >>8/31 8/25 >> Anidulafungin >> 8/28 >> Vancomycin  >>  8/4 blood x 2: neg FINAL 8/4 GB fossa abscess: multiple organisms present, none predominant. No Staph aureus or Group A strep (S. Pyogenes) isolated 8/4 abscess (anaerobic): neg FINAL 8/4 MRSA PCR: negative 8/14 urine: yeast-final 8/24 peritoneal fluid: no growth, final 8/25 blood x 2: no growth, final  Today, 02/16/2015 - Day 29 antibiotics - Tmax: 100.3 - WBC: elevated but decreasing, hydrocortisone tapered off today - Renal: CRRT stopped 8/30 AM,  watching for recovery. SCr up to 3.26 this morning.  Patient is making urine.   Plan:  - Continue meropenem 500 mg IV q12h - Continue anidulafungin 100 mg IV q24h - Hold Vancomycin today.  F/u level tomorrow and will re-dose based on levels. - Continue to monitor SCr daily and reassess abx dosing.   - F/u ID recommendations  Clance Boll, PharmD, BCPS Pager: [phone] 02/16/2015 9:31 AM  PM addendum: Meropenem to be de-escalated back to Zosyn since patient clinically improving, cx data remains negative.  Patient is off CRRT with some UOP ( today).   Plan:  Initiate Zosyn 3.375gm IV Q8h to be infused over 4hrs F/U renal function and adjust dose as needed.  Junita Push, PharmD, BCPS Pager: [phone] 02/16/2015@5 :10 PM

## 2015-02-16 NOTE — Progress Notes (Signed)
   02/16/15 1200  Clinical Encounter Type  Visited With Health care provider  Visit Type Follow-up  Referral From Nurse    Spiritual care following progress.  Family not present upon arrival.  Received update from nursing.  Will continue to attempt to connect with pt's spouse and sister for continued support.   Belva Crome MDiv

## 2015-02-16 NOTE — Plan of Care (Signed)
Problem: Phase II Progression Outcomes Goal: Date pt extubated/weaned off vent Outcome: Completed/Met Date Met:  02/16/15 Extubated 8/29 at 0915

## 2015-02-16 NOTE — Progress Notes (Signed)
PULMONARY / CRITICAL CARE MEDICINE   Name: Larry [name] MRN: [id number] DOB: 1946-02-07    ADMISSION DATE:  01/19/2015 CONSULTATION DATE:  8/4  REFERRING MD :  [name]  CHIEF COMPLAINT:  Septic shock   INITIAL PRESENTATION:  69 year old male w/ CAF on Elliquis who recently underwent lap chole on 7/14. Was discharged home w/drain. Drain removed about a week before presentation to ER. Admitted directly from [facility] on 8/3 w/ septic shock which was likely due to infected hematoma in the GB fossa. PCCM asked to see after new perc drain placed on 8/4, when he remained hypotensive.  Required CRRT 8/23 for AKI  Intubated 8/26 for desaturations, significant agitation, delirium  STUDIES:  CT abdomen/Pelvis W/ 7/28 - Gas & fluid at cholecystectomy site w/o discrete abscess. Min ascites. Small right pleural eff. CT abdomen/pelvis w/o 8/5 - Gas between liver & stomach. No additional free air. Increased flow void & high density layering c/w increasing hemorrhage as well as increased right pleural eff. CT abdomen/pelvis 8/18 >> 9.8 cm hematoma with gas in the gallbladder fossa, indwelling pigtail drainage catheter, 12.6 cm hematoma along the posterior R liver, mod ascites, mild wall thickening involving the R colon CT abdomen/pelvis 8/22 >> stable perihepatic hematoma, stable gallbladder fossa collection despite adequate drainage cath placement, significant free fluid within the abdomen/pelvis slightly increased from prior exam  SIGNIFICANT EVENTS: Admitted 8/3 w/ abd pain and hypotension  8/04  hypotensive over-night. Went for [facility] drain of GB fossa. Found what looked like old infected hematoma. PCCM asked to see post-op for shock 8/05  off pressors. WOB worse.  8/06  WOB & wheezing slightly worse. Progressing abdominal pain 8/07  Hgb declining w/ increasing intraperitoneal hemorrhage 8/07  Transfused 2u PRBCs 8/09  work of breathing still significant w/ marked upper airway component   8/10  breathing better. 1.6 liters negative. Slight bump in scr. Vanc stopped. Getting OOB for first time 8/11  eating regular diet. Up in chair. Feeling better.  Respiratory status improved.  PCCM s/o 8/14  Renal consulted for rising sr cr (to 3.60), baseline sr cr 1.1-1.5 (prior IV contrast 7/28).   8/14  Concern for yeast / bacterial UTI  8/15  ID consulted > abx narrowed to zosyn only, fluconazole for yeast  8/16  UOP increased on IV lasix 8/18  NPO, sr cr improving, CT abd as above  8/18  GB drain upsized per IR to 16 Fr 8/19  Abd pain, eating well.  Diarrhea after ensure.  To PO lasix.  Discussed gastrostomy tube with IR 8/22  Sr Cr rising, intermittent vomiting.  CT abd as above.  Increased confusion 8/23  HD cath inserted, CRRT initiated.  Gastrostomy tube held off due to CRRT & Hgb drop 8/24  WBC elevated, cultures repeated.   8/24  Paracentesis >> 150 ml "bloody bile" fluid removed, loculated and difficult to aspirate 8/25  Palliative care consulted, PCCM consulted.  Vomiting with NGT insertion.  8/26  Intubated early am(0200) for desaturations, significant agitation, delirium  8/30 CRRT stopped, bilious vomiting/secretions  SUBJECTIVE:   Vomiting stopped with NG to LIS Low gr febrile Denies pain Remains critically ill, on neo gtt, off CRRT  VITAL SIGNS: Temp:  [99.2 F (37.3 C)-100.3 F (37.9 C)] 100.3 F (37.9 C) (08/31 0400) Pulse Rate:  [53-148] 115 (08/31 0751) Resp:  [0-32] 27 (08/31 0751) BP: (85-131)/(32-116) 118/54 mmHg (08/31 0751) SpO2:  [89 %-100 %] 93 % (08/31 0751) Weight:  [  256 lb 13.4 oz (116.5 kg)] 256 lb 13.4 oz (116.5 kg) (08/31 0441)  HEMODYNAMICS: CVP:  [9 mmHg] 9 mmHg   VENTILATOR SETTINGS:     INTAKE / OUTPUT:  Intake/Output Summary (Last 24 hours) at 02/16/15 0829 Last data filed at 02/16/15 0700  Gross per 24 hour  Intake 3482.29 ml  Output   1550 ml  Net 1932.29 ml    PHYSICAL EXAMINATION: General:  Acutely ill adult male , oob  to cahir Neuro:  interactive, some confusion,anxious affect HEENT:  HD cath no discharge Cardiovascular:  s1s2 rrr, no m/r/g Lungs:  Clear, BL air entry + Abdomen:  RUQ perc drain in place with scant drainage. Hypoactive BS. Distended, mild diffuse tenderness Musculoskeletal:  No joint effusions or deformities. Skin:  gen edema  LABS:  CBC  Recent Labs Lab 02/14/15 0530  02/15/15 0430  02/15/15 0916 02/15/15 0946 02/16/15 0445  WBC 25.1*  --  15.5*  --   --   --  10.7*  HGB 7.6*  < > 7.0*  < > 7.8* 8.5* 11.0*  HCT 23.8*  < > 22.6*  < > 23.0* 25.0* 34.1*  PLT 156  --  114*  --   --   --  104*  < > = values in this interval not displayed. Coag's  Recent Labs Lab 02/14/15 0530 02/15/15 0430 02/16/15 0445  APTT 30 26 33   BMET  Recent Labs Lab 02/15/15 0430  02/15/15 0946 02/15/15 1815 02/16/15 0445  NA 136  < > 139 136 135  K 4.5  < > 4.1 4.6 4.9  CL 93*  < > 89* 93* 93*  CO2 36*  --   --  34* 33*  BUN 41*  < > 30* 55* 71*  CREATININE 1.90*  < > 1.40* 2.68* 3.26*  GLUCOSE 184*  < > 230* 131* 138*  < > = values in this interval not displayed. Electrolytes  Recent Labs Lab 02/14/15 0530 02/15/15 0430 02/15/15 1815 02/16/15 0445  CALCIUM 8.1*  8.2* 7.3* 7.8* 7.9*  MG 2.0 2.0  --  2.3  PHOS 3.1  3.1 2.5  2.5 3.2 4.9*  5.0*   Sepsis Markers No results for input(s): LATICACIDVEN, PROCALCITON, O2SATVEN in the last 168 hours.   ABG  Recent Labs Lab 02/12/15 1205 02/13/15 1300 02/14/15 0842  PHART 7.428 7.384 7.420  PCO2ART 43.0 56.4* 52.3*  PO2ART 77.8* 72.2* 73.7*   Liver Enzymes  Recent Labs Lab 02/10/15 0452  02/13/15 0415  02/14/15 0530 02/15/15 0430 02/15/15 1815 02/16/15 0445  AST 78*  --  36  --  55*  --   --   --   ALT 37  --  27  --  33  --   --   --   ALKPHOS 138*  --  107  --  100  --   --   --   BILITOT 1.3*  --  1.6*  --  2.0*  --   --   --   ALBUMIN 2.3*  < > 1.9*  2.0*  < > 1.9*  1.9* 1.7* 1.8* 1.7*  < > = values in  this interval not displayed.   Cardiac Enzymes No results for input(s): TROPONINI, PROBNP in the last 168 hours.   Glucose  Recent Labs Lab 02/15/15 1907 02/15/15 2004 02/15/15 2106 02/15/15 2205 02/15/15 2353 02/16/15 0405  GLUCAP 133* 118* 118* 108* 127* 139*    Imaging No results found.  ASSESSMENT /  PLAN:  PULMONARY A: ETT 8/26 >> 8/29 Acute Hypoxic Respiratory Failure - in the setting of significant agitation, renal fx, delirium  - resolved Chronically elevated right HD Right pleural effusion - progessing on Abd CT 8/6 P:   Xopenex neb q6hr PRN IS q 1h when awake   CARDIOVASCULAR CVL Right IJ 8/4 >> 8/22 RUE PICC 8/22 >>  A:  Severe sepsis/septic shock - resolved Hypotension - 8/26, started on neo post intubation, smouldering sepsis Hx PAF - CHADVASC score 4; currently NSR  Prior NSTEMI  Pressor needed 8/27, prece dex related>? Adrenal insuff P:  Taper Neo for MAP > 60 -taper Taper stress steroids dc vaso  RENAL A:   AKI- in setting of septic shock, prior hypotension/ATN  P:   Off Cvvhd,  Making some urine -BUN rising, follow, will have pos balance with TNA  GASTROINTESTINAL A:   Infected hematoma s/p cholecystectomy - Now s/p CT guided Perc drain with subsequent upsize to 16 Fr Last CT of abdomen - 02/07/2015 - complex gall bladder collection GERD - worse 8/5 w/ associated UAW wheeze Intra-abdominal hemorrhage / hematoma - Hgb stable now Nausea / Vomiting - concern for ileus (8/25) Protein Calorie Malnutrition   P:   TNA per pharmacy  Protonix QD NPO replace NG to LIS CT abd planned  HEMATOLOGIC A:   Anemia - in setting of intra-abdominal hemorrhage (8/4).  S/p 2 u PRBC on 8/4 & 2u 8/7   P:  Trend CBC  SCDs Transfuse for Hbg < 7  INFECTIOUS A:   Severe sepsis/septic shock - presume source is infected hematoma from recent Cholecystectomy. Shock resolved.  S/p Perc GB fossa site drain 8/4 - minimal serous output High Risk for  Fungemia P:   Trending WBC / Fever curve  ID following, appreciate input   Abscess 8/4>>>neg BCX2 8/4>>>neg  Peritoneal fluid GS 8/24 >> abundant WBC, no organisms seen Peritoneal fluid culture 8/24 >>  BCx2 8/25 >> ng Urine 8/14 >> yeast   Meropenem 8/24 >>  Fluconazole 8/15 >> 8/23 anigulofungin 8/26>>> vanc 8/28>>>    ENDOCRINE A: DM - CBG's NOT well controlled AI P:   SSI Q4 Given Lantus 40 units & come off drip  Added insulin to TNA   NEUROLOGIC A:  Abdominal Pain/Peritonitis -  Delirium  Critical Illness Deconditioning  P:   RASS goal: 0 Delirium prevention measures: minimize sedating medications, promote sleep / wake cycle  Fentanyl prn PT cons -oob to chair   FAMILY  - Updates:  D-in law 8/30  - Inter-disciplinary family meet or Palliative Care meeting due by:  Ongoing, family wants to continue full aggressive measures.  Hopeful that patient will be able to recover.     Summary - recovering MODS, extubated, on break from CRRT waiting for renal recovery, smouldering sepsis from intra abd source - rpt CT abd planned  The patient is critically ill with multiple organ systems failure and requires high complexity decision making for assessment and support, frequent evaluation and titration of therapies, application of advanced monitoring technologies and extensive interpretation of multiple databases. Critical Care Time devoted to patient care services described in this note independent of APP time is 31 minutes.    Cyril Mourning MD. Tonny Bollman. [facility] Pager [phone] If no response call [phone]

## 2015-02-16 NOTE — Progress Notes (Signed)
IR notified me of large volume of fluid. Now up to 2 liters w/ sig more fluid to remove.  Plan Will give 25 gm albumin to avoid hypotension given recent AKI.   Simonne Martinet ACNP-BC [facility] Pulmonary/Critical Care Pager # [phone] OR # [phone] if no answer

## 2015-02-16 NOTE — Progress Notes (Signed)
Subjective: No signif events. Vaso off. Still on some Neo. NG tube output increased about 1L. But had several BMs without aid. No complaints today. More conversive. Asking about results of CT. Urine output 255/24 hrs  Objective: Vital signs in last 24 hours: Temp:  [98.9 F (37.2 C)-100.3 F (37.9 C)] 98.9 F (37.2 C) (08/31 0800) Pulse Rate:  [53-124] 110 (08/31 1126) Resp:  [0-32] 20 (08/31 1126) BP: (87-131)/(32-116) 101/53 mmHg (08/31 1100) SpO2:  [89 %-100 %] 93 % (08/31 1126) Weight:  [116.5 kg (256 lb 13.4 oz)] 116.5 kg (256 lb 13.4 oz) (08/31 0441) Last BM Date: 02/16/15  Intake/Output from previous day: 08/30 0701 - 08/31 0700 In: 3668.8 [I.V.:768.6; NG/GT:60; IV Piggyback:380; TPN:2460.2] Out: 1748 [Urine:255; Emesis/NG output:1050; Drains:10] Intake/Output this shift: Total I/O In: 812.8 [I.V.:92.8; Other:20; NG/GT:210; IV Piggyback:50; TPN:440] Out: 135 [Urine:135]  More alert, conversive. Still with a little confusion Obese, soft, nt today, drain -old blood Ng bilious  Lab Results:   Recent Labs  02/15/15 0430  02/15/15 0946 02/16/15 0445  WBC 15.5*  --   --  10.7*  HGB 7.0*  < > 8.5* 11.0*  HCT 22.6*  < > 25.0* 34.1*  PLT 114*  --   --  104*  < > = values in this interval not displayed. BMET  Recent Labs  02/15/15 1815 02/16/15 0445  NA 136 135  K 4.6 4.9  CL 93* 93*  CO2 34* 33*  GLUCOSE 131* 138*  BUN 55* 71*  CREATININE 2.68* 3.26*  CALCIUM 7.8* 7.9*   PT/INR No results for input(s): LABPROT, INR in the last 72 hours. ABG  Recent Labs  02/13/15 1300 02/14/15 0842  PHART 7.384 7.420  HCO3 32.9* 33.3*    Studies/Results: Ct Abdomen Pelvis Wo Contrast  02/16/2015   CLINICAL DATA:  Six weeks post laparoscopic cholecystectomy complicated by a gallbladder fossa hematoma, followup, past surgical drain that was pulled, now having vomiting and abdominal pain, unable to keep oral intake down, past history hypertension, atrial  fibrillation, coronary artery disease post MI  EXAM: CT ABDOMEN AND PELVIS WITHOUT CONTRAST  TECHNIQUE: Multidetector CT imaging of the abdomen and pelvis was performed following the standard protocol without IV contrast. Sagittal and coronal MPR images reconstructed from axial data set. Patient drank a small amount of dilute oral contrast for exam.  COMPARISON:  02/07/2015  FINDINGS: Small RIGHT pleural effusion with bibasilar atelectasis.  Subcapsular fluid collection at lateral and posterior aspects of liver extending to dome, stable.  Pigtail drainage catheter identified within a persistent large fluid collection at the gallbladder fossa 11.7 x 9.0 x 8.5 cm previously 9.7 x 9.7 x 8.8 cm.  Additional subhepatic collection anteromedial to gallbladder collection containing air and fluid, decreased in size now measuring 5.0 x 5.0 x 4.4 cm, uncertain if communicating with the gallbladder fossa collection, previously measured 6.6 x 6.8 x 6.8 cm.  Multiple low-attenuation fluid collections without gas are seen throughout the abdomen including large amount of fluid at the gutters into the pelvis, interloop, perigastric, and anteriorly in the upper and mid abdomen, probably partially loculated at multiple sites.  Nasogastric tube extends to the pylorus.  Several foci of extraluminal gas are identified anterior to the gallbladder fossa collection, potentially related to the abscess collection and catheter drainage, without free intraperitoneal air at additional sites.  Spleen, pancreas, kidneys and adrenal glands unremarkable.  Scattered atherosclerotic calcifications.  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  Bladder decompressed by urinary  bladder.  No mass, adenopathy, hernia or acute bone lesion.  IMPRESSION: Persistent large gas and fluid collection at the gallbladder fossa slightly larger than on previous exam despite pigtail drainage catheter.  Interval mild decrease in size of a collection in  anteromedial to the gallbladder fossa collection, question communicating with drainage catheter.  Significant ascites, question partially loculated at sites in the upper abdomen.  Several small foci of gas are seen anterior to the gallbladder fossa collection, question related to percutaneous drainage, without additional free intraperitoneal air identified.   Electronically Signed   By: Ulyses Southward M.D.   On: 02/16/2015 11:24   Dg Chest Port 1 View  02/15/2015   CLINICAL DATA:  Respiratory failure  EXAM: PORTABLE CHEST - 1 VIEW  COMPARISON:  02/14/2015  FINDINGS: The endotracheal tube and nasogastric tube have been removed. The left jugular central line extends into the SVC. There is unchanged right hemidiaphragm elevation. There is continued improvement, with ongoing resolution of interstitial edema. No confluent airspace consolidation is evident.  IMPRESSION: Continued improvement. Removal of endotracheal tube and nasogastric tube.   Electronically Signed   By: Ellery Plunk M.D.   On: 02/15/2015 06:49    Anti-infectives: Anti-infectives    Start     Dose/Rate Route Frequency Ordered Stop   02/15/15 2200  meropenem (MERREM) 500 mg in sodium chloride 0.9 % 50 mL IVPB     500 mg 100 mL/hr over 30 Minutes Intravenous Every 12 hours 02/15/15 1415     02/14/15 1200  vancomycin (VANCOCIN) IVPB 1000 mg/200 mL premix  Status:  Discontinued     1,000 mg 200 mL/hr over 60 Minutes Intravenous Every 24 hours 02/13/15 0920 02/16/15 0706   02/13/15 1000  vancomycin (VANCOCIN) 1,750 mg in sodium chloride 0.9 % 500 mL IVPB     1,750 mg 250 mL/hr over 120 Minutes Intravenous  Once 02/13/15 0836 02/13/15 1210   02/12/15 1630  meropenem (MERREM) 500 mg in sodium chloride 0.9 % 50 mL IVPB  Status:  Discontinued     500 mg 100 mL/hr over 30 Minutes Intravenous Every 6 hours 02/12/15 1106 02/15/15 1415   02/11/15 1800  anidulafungin (ERAXIS) 100 mg in sodium chloride 0.9 % 100 mL IVPB     100 mg over 90  Minutes Intravenous Every 24 hours 02/10/15 1744     02/10/15 1730  anidulafungin (ERAXIS) 200 mg in sodium chloride 0.9 % 200 mL IVPB     200 mg over 180 Minutes Intravenous  Once 02/10/15 1720 02/10/15 2230   02/10/15 0200  meropenem (MERREM) 500 mg in sodium chloride 0.9 % 50 mL IVPB  Status:  Discontinued     500 mg 100 mL/hr over 30 Minutes Intravenous Every 8 hours 02/09/15 1808 02/12/15 1106   02/10/15 0000  meropenem (MERREM) 500 mg in sodium chloride 0.9 % 50 mL IVPB  Status:  Discontinued     500 mg 100 mL/hr over 30 Minutes Intravenous 4 times per day 02/09/15 1655 02/09/15 1808   02/09/15 1800  meropenem (MERREM) 1 g in sodium chloride 0.9 % 100 mL IVPB     1 g 200 mL/hr over 30 Minutes Intravenous  Once 02/09/15 1655 02/09/15 1818   02/08/15 1800  piperacillin-tazobactam (ZOSYN) IVPB 3.375 g  Status:  Discontinued     3.375 g 100 mL/hr over 30 Minutes Intravenous 4 times per day 02/08/15 1403 02/09/15 1655   02/07/15 1400  piperacillin-tazobactam (ZOSYN) IVPB 2.25 g  Status:  Discontinued  2.25 g 100 mL/hr over 30 Minutes Intravenous 3 times per day 02/07/15 0750 02/08/15 1403   02/04/15 1200  piperacillin-tazobactam (ZOSYN) IVPB 2.25 g  Status:  Discontinued     2.25 g 100 mL/hr over 30 Minutes Intravenous 4 times per day 02/04/15 0859 02/04/15 0905   02/04/15 1200  piperacillin-tazobactam (ZOSYN) IVPB 3.375 g  Status:  Discontinued     3.375 g 12.5 mL/hr over 240 Minutes Intravenous Every 8 hours 02/04/15 0905 02/07/15 0750   02/02/15 2000  fluconazole (DIFLUCAN) tablet 100 mg     100 mg Oral Every 24 hours 02/02/15 1908 02/08/15 2041   02/02/15 1800  fluconazole (DIFLUCAN) tablet 100 mg  Status:  Discontinued     100 mg Oral Daily 02/02/15 1545 02/02/15 1901   01/31/15 1000  fluconazole (DIFLUCAN) tablet 100 mg  Status:  Discontinued     100 mg Oral Daily 01/31/15 0727 02/02/15 1313   01/29/15 1700  piperacillin-tazobactam (ZOSYN) IVPB 2.25 g  Status:  Discontinued      2.25 g 100 mL/hr over 30 Minutes Intravenous Every 8 hours 01/29/15 0845 02/04/15 0859   01/24/15 2200  vancomycin (VANCOCIN) IVPB 1000 mg/200 mL premix     1,000 mg 200 mL/hr over 60 Minutes Intravenous  Once 01/24/15 1349 01/24/15 2301   01/21/15 2000  vancomycin (VANCOCIN) 1,500 mg in sodium chloride 0.9 % 500 mL IVPB  Status:  Discontinued     1,500 mg 250 mL/hr over 120 Minutes Intravenous Every 24 hours 01/21/15 1048 01/23/15 1726   01/20/15 2000  vancomycin (VANCOCIN) 1,250 mg in sodium chloride 0.9 % 250 mL IVPB  Status:  Discontinued     1,250 mg 166.7 mL/hr over 90 Minutes Intravenous Every 24 hours 01/19/15 1945 01/20/15 0843   01/20/15 2000  vancomycin (VANCOCIN) 1,250 mg in sodium chloride 0.9 % 250 mL IVPB  Status:  Discontinued     1,250 mg 166.7 mL/hr over 90 Minutes Intravenous Every 24 hours 01/20/15 1519 01/21/15 1048   01/20/15 0000  piperacillin-tazobactam (ZOSYN) IVPB 3.375 g  Status:  Discontinued     3.375 g 12.5 mL/hr over 240 Minutes Intravenous Every 8 hours 01/19/15 1946 01/29/15 0845   01/19/15 1715  vancomycin (VANCOCIN) 2,500 mg in sodium chloride 0.9 % 500 mL IVPB     2,500 mg 250 mL/hr over 120 Minutes Intravenous  Once 01/19/15 1707 01/19/15 2159   01/19/15 1715  piperacillin-tazobactam (ZOSYN) IVPB 3.375 g     3.375 g 100 mL/hr over 30 Minutes Intravenous  Once 01/19/15 1707 01/19/15 1900      Assessment/Plan: CT reviewed. 1 RUQ fluid collection smaller, 1 really no change. No free air. No bowel obstruction.  At this point I dont think any more intervention for RUQ/GB fossa hematoma is going to be helpful except for operative drainage which i think RISKS definitely outweigh benefits.   Appears to have ileus. Ng is essentially at pylorus/just past? If NG output remains high, will pull back a little. Not sure if other scattered intra-abd fluid collections are contributing to ileus. Bowels aren't really dilated. Cont reglan. Cont TPN.   Will defer IV  abxs (narrowing, duration, etc) to ID.   Could consider paracentesis of Right midabdomen/RLQ large fluid collection to see if infected. Previous aspirations were negative.   [name]. [name], MD, FACS General, Bariatric, & Minimally Invasive Surgery [facility], [place]   LOS: 28 days    [name] 02/16/2015

## 2015-02-16 NOTE — Progress Notes (Signed)
Nutrition Follow-up  DOCUMENTATION CODES:   Severe malnutrition in context of acute illness/injury, Morbid obesity  INTERVENTION:   Await decision of PO vs. Enteral nutrition  TPN per Pharmacy  TF recommendations if needed: Initiate Vital 1.5 @ 10 ml/hr and increase by 10 ml every 8 hours to goal rate of 55 ml/hr.  30 ml Prostat daily.  -RD to continue to monitor   NUTRITION DIAGNOSIS:   Inadequate oral intake related to inability to eat as evidenced by NPO status.  Ongoing.  GOAL:   Patient will meet greater than or equal to 90% of their needs  Meeting with TPN.  MONITOR:   Labs, Weight trends, Skin, I & O's, Other (Comment) (TPN regimen)  ASSESSMENT:   69 y/o admitted in 10/05/2012 with Sepsis, hypotension, acute renal failure and acute cholecystitis. He had been place on Eliquis for his AF by the [place]. He had an MI 2 years ago and did not follow up with his cardiologist. He was acutely ill and underwent a percutaneous drain placement on 10/06/14 by IR. He went home on 10/11/14. He was readmitted on 12/30/14 and underwent laparoscopic cholecystectomy with IOC. He was left with a drain in place and the site had "SNOW' placed in the GB fossa for hemostasis after the procedure. Drain was removed last week but there was some brusing at the site. He is transferred from [facility] with complaints of increased abdominal pain.  Pt was intubated from 8/26 - 8/29. Pt off CRRT. Re-estimated needs are below.  Speech unable to perform eval d/t pt's NGT. Pt on NGT d/t ileus. Pt to continue TPN. TPN infusing @ 100 ml/hr + 20% lipid infusion @ 10 ml/hr. Provides 120g protein and 2184 kcal.  Labs reviewed: CBGs: 93-139 Elevated BUN & Creatinine, Phos Mg WNL  Diet Order:  Diet NPO time specified Except for: Ice Chips TPN (CLINIMIX-E) Adult  Skin:  Wound (see comment) (abdominal incision)  Last BM:  8/31  Height:   Ht Readings from Last 1 Encounters:  01/20/15  (1.676 m)     Weight:   Wt Readings from Last 1 Encounters:  02/16/15 256 lb 13.4 oz (116.5 kg)    Ideal Body Weight:  64.54 kg (kg)  BMI:  Body mass index is 41.47 kg/(m^2).  Estimated Nutritional Needs:   Kcal:  1950-2150  Protein:  95-105g  Fluid:  2L/day  EDUCATION NEEDS:   No education needs identified at this time  Tilda Franco, MS, RD, LDN Pager: [phone] After Hours Pager: [phone]

## 2015-02-16 NOTE — Procedures (Signed)
US guided diagnostic/therapeutic paracentesis performed yielding 3.1 liters dark,bloody fluid. Collection was multiloculated. A portion of the fluid was sent for preordered labs. No immediate complications. Pt was given IV albumin during case per CCM.

## 2015-02-16 NOTE — Progress Notes (Signed)
[facility] ROUNDING NOTE   Subjective:   Interval History: still with bilious output from NGT  Objective:  Vital signs in last 24 hours:  Temp:  [98.9 F (37.2 C)-100.3 F (37.9 C)] 98.9 F (37.2 C) (08/31 0800) Pulse Rate:  [53-148] 110 (08/31 1126) Resp:  [0-32] 20 (08/31 1126) BP: (87-131)/(32-116) 101/53 mmHg (08/31 1100) SpO2:  [89 %-100 %] 93 % (08/31 1126) Weight:  [116.5 kg (256 lb 13.4 oz)] 116.5 kg (256 lb 13.4 oz) (08/31 0441)  Weight change: -1.4 kg (-3 lb 1.4 oz) Filed Weights   02/14/15 0500 02/15/15 0456 02/16/15 0441  Weight: 116.4 kg (256 lb 9.9 oz) 117.9 kg (259 lb 14.8 oz) 116.5 kg (256 lb 13.4 oz)    Intake/Output: I/O last 3 completed shifts: In: 6107.8 [I.V.:1791.7; Other:100; NG/GT:60; IV Piggyback:496] Out: 4211 [Urine:339; Emesis/NG output:1050; Drains:15; Other:2807]   Intake/Output this shift:  Total I/O In: 812.8 [I.V.:92.8; Other:20; NG/GT:210; IV Piggyback:50; TPN:440] Out: 135 [Urine:135]  CVS- RRR RS- CTA diminished ABD- distended EXT- no edema   Basic Metabolic Panel:  Recent Labs Lab 02/12/15 0650  02/13/15 0415  02/13/15 1650  02/14/15 0530  02/15/15 0430 02/15/15 0622 02/15/15 0916 02/15/15 0946 02/15/15 1815 02/16/15 0445  NA 131*  < > 134*  134*  < > 135  < > 135  137  < > 136 138 136 139 136 135  K 4.2  < > 4.1  4.1  < > 4.0  < > 4.3  4.3  < > 4.5 4.0 4.2 4.1 4.6 4.9  CL 94*  < > 87*  86*  < > 90*  < > 94*  93*  < > 93* 91* 91* 89* 93* 93*  CO2 30  < > 36*  36*  < > 35*  --  36*  36*  --  36*  --   --   --  34* 33*  GLUCOSE 327*  < > 450*  445*  < > 339*  < > 150*  146*  < > 184* 235* 186* 230* 131* 138*  BUN 36*  < > 33*  34*  < > 41*  < > 38*  38*  < > 41* 30* 36* 30* 55* 71*  CREATININE 2.18*  < > 2.14*  2.23*  < > 2.32*  < > 2.06*  2.06*  < > 1.90* 1.40* 1.90* 1.40* 2.68* 3.26*  CALCIUM 8.8*  < > 9.6  9.5  < > 9.4  --  8.1*  8.2*  --  7.3*  --   --   --  7.8* 7.9*  MG 2.2  --  2.0   --   --   --  2.0  --  2.0  --   --   --   --  2.3  PHOS 3.1  3.1  < > 3.1  3.1  --  3.1  --  3.1  3.1  --  2.5  2.5  --   --   --  3.2 4.9*  5.0*  < > = values in this interval not displayed.  Liver Function Tests:  Recent Labs Lab 02/10/15 0452  02/13/15 0415 02/13/15 1650 02/14/15 0530 02/15/15 0430 02/15/15 1815 02/16/15 0445  AST 78*  --  36  --  55*  --   --   --   ALT 37  --  27  --  33  --   --   --   ALKPHOS 138*  --  107  --  100  --   --   --   BILITOT 1.3*  --  1.6*  --  2.0*  --   --   --   PROT 7.3  --  6.4*  --  6.5  --   --   --   ALBUMIN 2.3*  < > 1.9*  2.0* 2.0* 1.9*  1.9* 1.7* 1.8* 1.7*  < > = values in this interval not displayed. No results for input(s): LIPASE, AMYLASE in the last 168 hours. No results for input(s): AMMONIA in the last 168 hours.  CBC:  Recent Labs Lab 02/11/15 0600  02/12/15 0650  02/13/15 0415  02/14/15 0530  02/15/15 0430 02/15/15 0622 02/15/15 0916 02/15/15 0946 02/16/15 0445  WBC 22.3*  --  21.6*  --  23.8*  --  25.1*  --  15.5*  --   --   --  10.7*  NEUTROABS 20.1*  --   --   --  18.7*  --  20.5*  --   --   --   --   --   --   HGB 8.5*  < > 8.0*  < > 8.7*  < > 7.6*  < > 7.0* 8.5* 7.8* 8.5* 11.0*  HCT 26.2*  < > 26.2*  < > 27.6*  < > 23.8*  < > 22.6* 25.0* 23.0* 25.0* 34.1*  MCV 91.9  --  90.7  --  92.6  --  94.4  --  95.0  --   --   --  95.5  PLT 413*  --  250  --  160  --  156  --  114*  --   --   --  104*  < > = values in this interval not displayed.  Cardiac Enzymes: No results for input(s): CKTOTAL, CKMB, CKMBINDEX, TROPONINI in the last 168 hours.  BNP: Invalid input(s): POCBNP  CBG:  Recent Labs Lab 02/15/15 2106 02/15/15 2205 02/15/15 2353 02/16/15 0405 02/16/15 0801  GLUCAP 118* 108* 127* 139* 102*    Microbiology: Results for orders placed or performed during the hospital encounter of 01/19/15  Culture, routine-abscess     Status: None   Collection Time: 01/20/15  1:00 PM  Result Value  Ref Range Status   Specimen Description ABSCESS GALL BLADDER FOSSA  Final   Special Requests Normal  Final   Gram Stain   Final    ABUNDANT WBC PRESENT,BOTH PMN AND MONONUCLEAR NO SQUAMOUS EPITHELIAL CELLS SEEN NO ORGANISMS SEEN Performed at [facility]    Culture   Final    MULTIPLE ORGANISMS PRESENT, NONE PREDOMINANT Note: NO STAPHYLOCOCCUS AUREUS ISOLATED NO GROUP A STREP (S.PYOGENES) ISOLATED Performed at [facility]    Report Status 01/23/2015 FINAL  Final  Anaerobic culture     Status: None   Collection Time: 01/20/15  1:00 PM  Result Value Ref Range Status   Specimen Description ABSCESS GALL BLADDER FOSSA  Final   Special Requests Normal  Final   Gram Stain   Final    ABUNDANT WBC PRESENT,BOTH PMN AND MONONUCLEAR NO SQUAMOUS EPITHELIAL CELLS SEEN NO ORGANISMS SEEN Performed at [facility]    Culture   Final    NO ANAEROBES ISOLATED Performed at [facility]    Report Status 01/26/2015 FINAL  Final  MRSA PCR Screening     Status: None   Collection Time: 01/20/15  1:39 PM  Result Value Ref Range Status   MRSA  by PCR NEGATIVE NEGATIVE Final    Comment:        The GeneXpert MRSA Assay (FDA approved for NASAL specimens only), is one component of a comprehensive MRSA colonization surveillance program. It is not intended to diagnose MRSA infection nor to guide or monitor treatment for MRSA infections. Performed at [facility]   Culture, blood (routine x 2)     Status: None   Collection Time: 01/20/15  1:50 PM  Result Value Ref Range Status   Specimen Description BLOOD RIGHT ARM  Final   Special Requests IN PEDIATRIC BOTTLE 4CC  Final   Culture   Final    NO GROWTH 5 DAYS Performed at [facility]    Report Status 01/25/2015 FINAL  Final  Culture, blood (routine x 2)     Status: None   Collection Time: 01/20/15  1:55 PM  Result Value Ref Range Status   Specimen Description BLOOD RIGHT HAND  Final   Special  Requests IN PEDIATRIC BOTTLE 4CC  Final   Culture   Final    NO GROWTH 5 DAYS Performed at [facility]    Report Status 01/25/2015 FINAL  Final  Urine culture     Status: None   Collection Time: 01/30/15 11:40 AM  Result Value Ref Range Status   Specimen Description URINE, RANDOM  Final   Special Requests NONE  Final   Culture   Final    >=100,000 COLONIES/mL YEAST Performed at [facility]    Report Status 02/01/2015 FINAL  Final  Culture, body fluid-bottle     Status: None   Collection Time: 02/09/15  2:10 PM  Result Value Ref Range Status   Specimen Description FLUID PERITONEAL  Final   Special Requests BOTTLES DRAWN AEROBIC AND ANAEROBIC 10CC  Final   Culture   Final    NO GROWTH 5 DAYS Performed at [facility]    Report Status 02/14/2015 FINAL  Final  Gram stain     Status: None   Collection Time: 02/09/15  2:10 PM  Result Value Ref Range Status   Specimen Description FLUID PERITONEAL  Final   Special Requests BOTTLES DRAWN AEROBIC AND ANAEROBIC 10CC  Final   Gram Stain   Final    ABUNDANT WBC PRESENT,BOTH PMN AND MONONUCLEAR NO ORGANISMS SEEN Performed at [facility]    Report Status 02/09/2015 FINAL  Final  Culture, blood (routine x 2)     Status: None   Collection Time: 02/10/15  4:00 PM  Result Value Ref Range Status   Specimen Description BLOOD LEFT ANTECUBITAL  Final   Special Requests BOTTLES DRAWN AEROBIC ONLY 7CC  Final   Culture   Final    NO GROWTH 5 DAYS Performed at [facility]    Report Status 02/15/2015 FINAL  Final  Culture, blood (routine x 2)     Status: None   Collection Time: 02/10/15  4:15 PM  Result Value Ref Range Status   Specimen Description BLOOD LEFT FOREARM  Final   Special Requests BOTTLES DRAWN AEROBIC ONLY 5CC  Final   Culture   Final    NO GROWTH 5 DAYS Performed at [facility]    Report Status 02/15/2015 FINAL  Final    Coagulation Studies: No results for input(s): LABPROT,  INR in the last 72 hours.  Urinalysis: No results for input(s): COLORURINE, LABSPEC, PHURINE, GLUCOSEU, HGBUR, BILIRUBINUR, KETONESUR, PROTEINUR, UROBILINOGEN, NITRITE, LEUKOCYTESUR in the last 72 hours.  Invalid input(s): APPERANCEUR    Imaging: Ct Abdomen Pelvis Wo Contrast  02/16/2015   CLINICAL DATA:  Six weeks post laparoscopic cholecystectomy complicated by a gallbladder fossa hematoma, followup, past surgical drain that was pulled, now having vomiting and abdominal pain, unable to keep oral intake down, past history hypertension, atrial fibrillation, coronary artery disease post MI  EXAM: CT ABDOMEN AND PELVIS WITHOUT CONTRAST  TECHNIQUE: Multidetector CT imaging of the abdomen and pelvis was performed following the standard protocol without IV contrast. Sagittal and coronal MPR images reconstructed from axial data set. Patient drank a small amount of dilute oral contrast for exam.  COMPARISON:  02/07/2015  FINDINGS: Small RIGHT pleural effusion with bibasilar atelectasis.  Subcapsular fluid collection at lateral and posterior aspects of liver extending to dome, stable.  Pigtail drainage catheter identified within a persistent large fluid collection at the gallbladder fossa 11.7 x 9.0 x 8.5 cm previously 9.7 x 9.7 x 8.8 cm.  Additional subhepatic collection anteromedial to gallbladder collection containing air and fluid, decreased in size now measuring 5.0 x 5.0 x 4.4 cm, uncertain if communicating with the gallbladder fossa collection, previously measured 6.6 x 6.8 x 6.8 cm.  Multiple low-attenuation fluid collections without gas are seen throughout the abdomen including large amount of fluid at the gutters into the pelvis, interloop, perigastric, and anteriorly in the upper and mid abdomen, probably partially loculated at multiple sites.  Nasogastric tube extends to the pylorus.  Several foci of extraluminal gas are identified anterior to the gallbladder fossa collection, potentially related to  the abscess collection and catheter drainage, without free intraperitoneal air at additional sites.  Spleen, pancreas, kidneys and adrenal glands unremarkable.  Scattered atherosclerotic calcifications.  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  Bladder decompressed by urinary bladder.  No mass, adenopathy, hernia or acute bone lesion.  IMPRESSION: Persistent large gas and fluid collection at the gallbladder fossa slightly larger than on previous exam despite pigtail drainage catheter.  Interval mild decrease in size of a collection in anteromedial to the gallbladder fossa collection, question communicating with drainage catheter.  Significant ascites, question partially loculated at sites in the upper abdomen.  Several small foci of gas are seen anterior to the gallbladder fossa collection, question related to percutaneous drainage, without additional free intraperitoneal air identified.   Electronically Signed   By: Ulyses Southward M.D.   On: 02/16/2015 11:24   Dg Chest Port 1 View  02/15/2015   CLINICAL DATA:  Respiratory failure  EXAM: PORTABLE CHEST - 1 VIEW  COMPARISON:  02/14/2015  FINDINGS: The endotracheal tube and nasogastric tube have been removed. The left jugular central line extends into the SVC. There is unchanged right hemidiaphragm elevation. There is continued improvement, with ongoing resolution of interstitial edema. No confluent airspace consolidation is evident.  IMPRESSION: Continued improvement. Removal of endotracheal tube and nasogastric tube.   Electronically Signed   By: Ellery Plunk M.D.   On: 02/15/2015 06:49     Medications:   . sodium chloride 10 mL/hr at 02/16/15 0700  . calcium gluconate infusion for CRRT Stopped (02/15/15 1100)  . Marland KitchenTPN (CLINIMIX-E) Adult 100 mL/hr at 02/16/15 0700   And  . fat emulsion 240 mL (02/16/15 0700)  . phenylephrine (NEO-SYNEPHRINE) Adult infusion 40 mcg/min (02/16/15 0857)  . dialysate (PRISMASATE) 1,500 mL/hr at  02/15/15 0946  . dialysis replacement fluid (prismasate) 300 mL/hr at 02/14/15 2130  . sodium citrate 2 %/dextrose 2.5% solution 3000 mL 250 mL/hr at 02/15/15 0030   .  anidulafungin  100 mg Intravenous Q24H  . antiseptic oral rinse  7 mL Mouth Rinse q12n4p  . chlorhexidine  15 mL Mouth Rinse BID  . darbepoetin (ARANESP) injection - NON-DIALYSIS  200 mcg Subcutaneous Q Wed-1800  . heparin subcutaneous  5,000 Units Subcutaneous 3 times per day  . insulin aspart  0-20 Units Subcutaneous 6 times per day  . insulin glargine  35 Units Subcutaneous QHS  . ipratropium  0.5 mg Nebulization Q6H  . levalbuterol  0.63 mg Nebulization Q6H  . meropenem (MERREM) IV  500 mg Intravenous Q12H  . nystatin   Topical TID  . pantoprazole (PROTONIX) IV  40 mg Intravenous Q24H  . sodium chloride  10-40 mL Intracatheter Q12H   acetaminophen, fentaNYL (SUBLIMAZE) injection, heparin, heparin, iohexol, iohexol, ipratropium, levalbuterol, [DISCONTINUED] ondansetron **OR** ondansetron (ZOFRAN) IV, sodium chloride  Assessment/ Plan:   AKI prolonged ATN CRRT stopped 12/20 some urine output   Electrolytes stabe  Anemia stable  Acid base controlled   LOS: 28 Mailani Degroote W @TODAY @12 :31 PM

## 2015-02-17 DIAGNOSIS — B9689 Other specified bacterial agents as the cause of diseases classified elsewhere: Secondary | ICD-10-CM

## 2015-02-17 LAB — CBC
HCT: 20.5 % — ABNORMAL LOW (ref 39.0–52.0)
HEMATOCRIT: 34.1 % — AB (ref 39.0–52.0)
HEMATOCRIT: 22.7 % — AB (ref 39.0–52.0)
HEMOGLOBIN: 7.4 g/dL — AB (ref 13.0–17.0)
Hemoglobin: 11 g/dL — ABNORMAL LOW (ref 13.0–17.0)
Hemoglobin: 6.5 g/dL — CL (ref 13.0–17.0)
MCH: 30.8 pg (ref 26.0–34.0)
MCH: 30.2 pg (ref 26.0–34.0)
MCH: 30.2 pg (ref 26.0–34.0)
MCHC: 32.3 g/dL (ref 30.0–36.0)
MCHC: 31.7 g/dL (ref 30.0–36.0)
MCHC: 32.6 g/dL (ref 30.0–36.0)
MCV: 95.5 fL (ref 78.0–100.0)
MCV: 95.3 fL (ref 78.0–100.0)
MCV: 92.7 fL (ref 78.0–100.0)
Platelets: 104 10*3/uL — ABNORMAL LOW (ref 150–400)
Platelets: 185 10*3/uL (ref 150–400)
Platelets: 189 10*3/uL (ref 150–400)
RBC: 3.57 MIL/uL — ABNORMAL LOW (ref 4.22–5.81)
RBC: 2.15 MIL/uL — ABNORMAL LOW (ref 4.22–5.81)
RBC: 2.45 MIL/uL — AB (ref 4.22–5.81)
RDW: 24 % — AB (ref 11.5–15.5)
RDW: 25.4 % — AB (ref 11.5–15.5)
RDW: 23.7 % — ABNORMAL HIGH (ref 11.5–15.5)
WBC: 10.7 10*3/uL — ABNORMAL HIGH (ref 4.0–10.5)
WBC: 12.1 10*3/uL — AB (ref 4.0–10.5)
WBC: 10.5 10*3/uL (ref 4.0–10.5)

## 2015-02-17 LAB — COMPREHENSIVE METABOLIC PANEL
ALBUMIN: 1.8 g/dL — AB (ref 3.5–5.0)
ALK PHOS: 136 U/L — AB (ref 38–126)
ALT: 26 U/L (ref 17–63)
ANION GAP: 11 (ref 5–15)
AST: 47 U/L — AB (ref 15–41)
BILIRUBIN TOTAL: 1.7 mg/dL — AB (ref 0.3–1.2)
BUN: 95 mg/dL — AB (ref 6–20)
CALCIUM: 7.7 mg/dL — AB (ref 8.9–10.3)
CO2: 30 mmol/L (ref 22–32)
Chloride: 93 mmol/L — ABNORMAL LOW (ref 101–111)
Creatinine, Ser: 3.82 mg/dL — ABNORMAL HIGH (ref 0.61–1.24)
GFR calc Af Amer: 17 mL/min — ABNORMAL LOW (ref >60)
GFR, EST NON AFRICAN AMERICAN: 15 mL/min — AB (ref >60)
GLUCOSE: 103 mg/dL — AB (ref 65–99)
POTASSIUM: 4.5 mmol/L (ref 3.5–5.1)
Sodium: 134 mmol/L — ABNORMAL LOW (ref 135–145)
TOTAL PROTEIN: 5.7 g/dL — AB (ref 6.5–8.1)

## 2015-02-17 LAB — RENAL FUNCTION PANEL
Albumin: 1.7 g/dL — ABNORMAL LOW (ref 3.5–5.0)
Anion gap: 10 (ref 5–15)
BUN: 104 mg/dL — ABNORMAL HIGH (ref 6–20)
CALCIUM: 7.7 mg/dL — AB (ref 8.9–10.3)
CO2: 30 mmol/L (ref 22–32)
CREATININE: 3.93 mg/dL — AB (ref 0.61–1.24)
Chloride: 96 mmol/L — ABNORMAL LOW (ref 101–111)
GFR, EST AFRICAN AMERICAN: 17 mL/min — AB (ref >60)
GFR, EST NON AFRICAN AMERICAN: 14 mL/min — AB (ref >60)
Glucose, Bld: 129 mg/dL — ABNORMAL HIGH (ref 65–99)
Phosphorus: 6.4 mg/dL — ABNORMAL HIGH (ref 2.5–4.6)
Potassium: 4.3 mmol/L (ref 3.5–5.1)
SODIUM: 136 mmol/L (ref 135–145)

## 2015-02-17 LAB — C DIFFICILE QUICK SCREEN W PCR REFLEX
C DIFFICILE (CDIFF) INTERP: NEGATIVE
C DIFFICLE (CDIFF) ANTIGEN: NEGATIVE
C Diff toxin: NEGATIVE

## 2015-02-17 LAB — MAGNESIUM: MAGNESIUM: 2.2 mg/dL (ref 1.7–2.4)

## 2015-02-17 LAB — GLUCOSE, CAPILLARY
GLUCOSE-CAPILLARY: 110 mg/dL — AB (ref 65–99)
GLUCOSE-CAPILLARY: 130 mg/dL — AB (ref 65–99)
GLUCOSE-CAPILLARY: 134 mg/dL — AB (ref 65–99)
Glucose-Capillary: 104 mg/dL — ABNORMAL HIGH (ref 65–99)
Glucose-Capillary: 107 mg/dL — ABNORMAL HIGH (ref 65–99)
Glucose-Capillary: 124 mg/dL — ABNORMAL HIGH (ref 65–99)

## 2015-02-17 LAB — PHOSPHORUS: Phosphorus: 6.3 mg/dL — ABNORMAL HIGH (ref 2.5–4.6)

## 2015-02-17 LAB — VANCOMYCIN, RANDOM: VANCOMYCIN RM: 15 ug/mL

## 2015-02-17 LAB — APTT: APTT: 34 s (ref 24–37)

## 2015-02-17 LAB — AMYLASE, PERITONEAL FLUID: AMYLASE, PERITONEAL FLUID: 10 U/L

## 2015-02-17 LAB — PREPARE RBC (CROSSMATCH)

## 2015-02-17 LAB — CALCIUM, IONIZED: Calcium, Ionized, Serum: 4.7 mg/dL (ref 4.5–5.6)

## 2015-02-17 MED ORDER — TRACE MINERALS CR-CU-MN-SE-ZN 10-1000-500-60 MCG/ML IV SOLN
INTRAVENOUS | Status: AC
  Administered 2015-02-17: 17:00:00 via INTRAVENOUS
  Filled 2015-02-17: qty 1920

## 2015-02-17 MED ORDER — FAT EMULSION 20 % IV EMUL
240.0000 mL | INTRAVENOUS | Status: AC
  Administered 2015-02-17: 240 mL via INTRAVENOUS
  Filled 2015-02-17: qty 250

## 2015-02-17 MED ORDER — SODIUM CHLORIDE 0.9 % IV SOLN: Freq: Once | INTRAVENOUS | Status: DC

## 2015-02-17 MED ORDER — VANCOMYCIN HCL IN DEXTROSE 1-5 GM/200ML-% IV SOLN
1000.0000 mg | Freq: Once | INTRAVENOUS | Status: AC
  Administered 2015-02-17: 1000 mg via INTRAVENOUS
  Filled 2015-02-17: qty 200

## 2015-02-17 NOTE — Progress Notes (Signed)
Subjective: Denies abd pain. Had 3L drained from abd - by report old blood no bile. Several BMs. Ng tube output about 800  Objective: Vital signs in last 24 hours: Temp:  [98.2 F (36.8 C)-98.9 F (37.2 C)] 98.5 F (36.9 C) (09/01 0800) Pulse Rate:  [104-118] 108 (09/01 0700) Resp:  [15-30] 22 (09/01 0600) BP: (88-128)/(40-103) 105/45 mmHg (09/01 0700) SpO2:  [88 %-100 %] 98 % (09/01 0813) Weight:  [115.5 kg (254 lb 10.1 oz)] 115.5 kg (254 lb 10.1 oz) (09/01 0500) Last BM Date: 02/17/15  Intake/Output from previous day: 08/31 0701 - 09/01 0700 In: 3397.2 [I.V.:550.1; NG/GT:210; IV Piggyback:330; TPN:2272.1] Out: 2046 [Urine:1245; Emesis/NG output:800; Stool:1] Intake/Output this shift: Total I/O In: 30 [NG/GT:30] Out: -   Awake, alert, knows name, year, and president. Doesn't want to vote for either candidate - they are both bad Soft, nt, drain - nothing.   Lab Results:   Recent Labs  02/16/15 0445 02/17/15 0500  WBC 10.7* 12.1*  HGB 11.0* 6.5*  HCT 34.1* 20.5*  PLT 104* 185   BMET  Recent Labs  02/16/15 1715 02/17/15 0500  NA 137 134*  K 4.8 4.5  CL 94* 93*  CO2 34* 30  GLUCOSE 80 103*  BUN 87* 95*  CREATININE 3.60* 3.82*  CALCIUM 8.1* 7.7*   PT/INR No results for input(s): LABPROT, INR in the last 72 hours. ABG  Recent Labs  02/14/15 0842  PHART 7.420  HCO3 33.3*    Studies/Results: Ct Abdomen Pelvis Wo Contrast  02/16/2015   CLINICAL DATA:  Six weeks post laparoscopic cholecystectomy complicated by a gallbladder fossa hematoma, followup, past surgical drain that was pulled, now having vomiting and abdominal pain, unable to keep oral intake down, past history hypertension, atrial fibrillation, coronary artery disease post MI  EXAM: CT ABDOMEN AND PELVIS WITHOUT CONTRAST  TECHNIQUE: Multidetector CT imaging of the abdomen and pelvis was performed following the standard protocol without IV contrast. Sagittal and coronal MPR images reconstructed  from axial data set. Patient drank a small amount of dilute oral contrast for exam.  COMPARISON:  02/07/2015  FINDINGS: Small RIGHT pleural effusion with bibasilar atelectasis.  Subcapsular fluid collection at lateral and posterior aspects of liver extending to dome, stable.  Pigtail drainage catheter identified within a persistent large fluid collection at the gallbladder fossa 11.7 x 9.0 x 8.5 cm previously 9.7 x 9.7 x 8.8 cm.  Additional subhepatic collection anteromedial to gallbladder collection containing air and fluid, decreased in size now measuring 5.0 x 5.0 x 4.4 cm, uncertain if communicating with the gallbladder fossa collection, previously measured 6.6 x 6.8 x 6.8 cm.  Multiple low-attenuation fluid collections without gas are seen throughout the abdomen including large amount of fluid at the gutters into the pelvis, interloop, perigastric, and anteriorly in the upper and mid abdomen, probably partially loculated at multiple sites.  Nasogastric tube extends to the pylorus.  Several foci of extraluminal gas are identified anterior to the gallbladder fossa collection, potentially related to the abscess collection and catheter drainage, without free intraperitoneal air at additional sites.  Spleen, pancreas, kidneys and adrenal glands unremarkable.  Scattered atherosclerotic calcifications.  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  Bladder decompressed by urinary bladder.  No mass, adenopathy, hernia or acute bone lesion.  IMPRESSION: Persistent large gas and fluid collection at the gallbladder fossa slightly larger than on previous exam despite pigtail drainage catheter.  Interval mild decrease in size of a collection in anteromedial to the  gallbladder fossa collection, question communicating with drainage catheter.  Significant ascites, question partially loculated at sites in the upper abdomen.  Several small foci of gas are seen anterior to the gallbladder fossa collection,  question related to percutaneous drainage, without additional free intraperitoneal air identified.   Electronically Signed   By: Ulyses Southward M.D.   On: 02/16/2015 11:24   US Paracentesis  02/16/2015   INDICATION: Patient with recent history of sepsis, gallbladder fossa abscess drainage, renal insufficiency, ascites. Request made for diagnostic and therapeutic paracentesis.  EXAM: ULTRASOUND-GUIDED DIAGNOSTIC AND THERAPEUTIC PARACENTESIS  COMPARISON:  Prior paracentesis on 02/09/2015  MEDICATIONS: None.  COMPLICATIONS: None immediate  TECHNIQUE: Informed written consent was obtained from the patient after a discussion of the risks, benefits and alternatives to treatment. A timeout was performed prior to the initiation of the procedure.  Initial ultrasound scanning demonstrates a moderate-to-large amount of ascites within the right lower abdominal quadrant. The right lower abdomen was prepped and draped in the usual sterile fashion. 1% lidocaine was used for local anesthesia. Under direct ultrasound guidance, a 19 gauge, 10-cm, Yueh catheter was introduced. An ultrasound image was saved for documentation purposed. The paracentesis was performed. The catheter was removed and a dressing was applied. The patient tolerated the procedure well without immediate post procedural complication.  FINDINGS: A total of approximately 3.1 liters of dark, bloody fluid was removed. Samples were sent to the laboratory as requested by the clinical team.  IMPRESSION: Successful ultrasound-guided diagnostic and therapeutic paracentesis yielding 3.1 liters of peritoneal fluid. Critical care medicine notified of the above findings. The patient was given IV albumin during the procedure.  Read by: Jeananne Rama, PA-C   Electronically Signed   By: Irish Lack M.D.   On: 02/16/2015 16:56    Anti-infectives: Anti-infectives    Start     Dose/Rate Route Frequency Ordered Stop   02/16/15 2200  piperacillin-tazobactam (ZOSYN) IVPB 3.375  g     3.375 g 12.5 mL/hr over 240 Minutes Intravenous 3 times per day 02/16/15 1603     02/15/15 2200  meropenem (MERREM) 500 mg in sodium chloride 0.9 % 50 mL IVPB  Status:  Discontinued     500 mg 100 mL/hr over 30 Minutes Intravenous Every 12 hours 02/15/15 1415 02/16/15 1533   02/14/15 1200  vancomycin (VANCOCIN) IVPB 1000 mg/200 mL premix  Status:  Discontinued     1,000 mg 200 mL/hr over 60 Minutes Intravenous Every 24 hours 02/13/15 0920 02/16/15 0706   02/13/15 1000  vancomycin (VANCOCIN) 1,750 mg in sodium chloride 0.9 % 500 mL IVPB     1,750 mg 250 mL/hr over 120 Minutes Intravenous  Once 02/13/15 0836 02/13/15 1210   02/12/15 1630  meropenem (MERREM) 500 mg in sodium chloride 0.9 % 50 mL IVPB  Status:  Discontinued     500 mg 100 mL/hr over 30 Minutes Intravenous Every 6 hours 02/12/15 1106 02/15/15 1415   02/11/15 1800  anidulafungin (ERAXIS) 100 mg in sodium chloride 0.9 % 100 mL IVPB     100 mg over 90 Minutes Intravenous Every 24 hours 02/10/15 1744     02/10/15 1730  anidulafungin (ERAXIS) 200 mg in sodium chloride 0.9 % 200 mL IVPB     200 mg over 180 Minutes Intravenous  Once 02/10/15 1720 02/10/15 2230   02/10/15 0200  meropenem (MERREM) 500 mg in sodium chloride 0.9 % 50 mL IVPB  Status:  Discontinued     500 mg 100 mL/hr over 30  Minutes Intravenous Every 8 hours 02/09/15 1808 02/12/15 1106   02/10/15 0000  meropenem (MERREM) 500 mg in sodium chloride 0.9 % 50 mL IVPB  Status:  Discontinued     500 mg 100 mL/hr over 30 Minutes Intravenous 4 times per day 02/09/15 1655 02/09/15 1808   02/09/15 1800  meropenem (MERREM) 1 g in sodium chloride 0.9 % 100 mL IVPB     1 g 200 mL/hr over 30 Minutes Intravenous  Once 02/09/15 1655 02/09/15 1818   02/08/15 1800  piperacillin-tazobactam (ZOSYN) IVPB 3.375 g  Status:  Discontinued     3.375 g 100 mL/hr over 30 Minutes Intravenous 4 times per day 02/08/15 1403 02/09/15 1655   02/07/15 1400  piperacillin-tazobactam (ZOSYN) IVPB  2.25 g  Status:  Discontinued     2.25 g 100 mL/hr over 30 Minutes Intravenous 3 times per day 02/07/15 0750 02/08/15 1403   02/04/15 1200  piperacillin-tazobactam (ZOSYN) IVPB 2.25 g  Status:  Discontinued     2.25 g 100 mL/hr over 30 Minutes Intravenous 4 times per day 02/04/15 0859 02/04/15 0905   02/04/15 1200  piperacillin-tazobactam (ZOSYN) IVPB 3.375 g  Status:  Discontinued     3.375 g 12.5 mL/hr over 240 Minutes Intravenous Every 8 hours 02/04/15 0905 02/07/15 0750   02/02/15 2000  fluconazole (DIFLUCAN) tablet 100 mg     100 mg Oral Every 24 hours 02/02/15 1908 02/08/15 2041   02/02/15 1800  fluconazole (DIFLUCAN) tablet 100 mg  Status:  Discontinued     100 mg Oral Daily 02/02/15 1545 02/02/15 1901   01/31/15 1000  fluconazole (DIFLUCAN) tablet 100 mg  Status:  Discontinued     100 mg Oral Daily 01/31/15 0727 02/02/15 1313   01/29/15 1700  piperacillin-tazobactam (ZOSYN) IVPB 2.25 g  Status:  Discontinued     2.25 g 100 mL/hr over 30 Minutes Intravenous Every 8 hours 01/29/15 0845 02/04/15 0859   01/24/15 2200  vancomycin (VANCOCIN) IVPB 1000 mg/200 mL premix     1,000 mg 200 mL/hr over 60 Minutes Intravenous  Once 01/24/15 1349 01/24/15 2301   01/21/15 2000  vancomycin (VANCOCIN) 1,500 mg in sodium chloride 0.9 % 500 mL IVPB  Status:  Discontinued     1,500 mg 250 mL/hr over 120 Minutes Intravenous Every 24 hours 01/21/15 1048 01/23/15 1726   01/20/15 2000  vancomycin (VANCOCIN) 1,250 mg in sodium chloride 0.9 % 250 mL IVPB  Status:  Discontinued     1,250 mg 166.7 mL/hr over 90 Minutes Intravenous Every 24 hours 01/19/15 1945 01/20/15 0843   01/20/15 2000  vancomycin (VANCOCIN) 1,250 mg in sodium chloride 0.9 % 250 mL IVPB  Status:  Discontinued     1,250 mg 166.7 mL/hr over 90 Minutes Intravenous Every 24 hours 01/20/15 1519 01/21/15 1048   01/20/15 0000  piperacillin-tazobactam (ZOSYN) IVPB 3.375 g  Status:  Discontinued     3.375 g 12.5 mL/hr over 240 Minutes  Intravenous Every 8 hours 01/19/15 1946 01/29/15 0845   01/19/15 1715  vancomycin (VANCOCIN) 2,500 mg in sodium chloride 0.9 % 500 mL IVPB     2,500 mg 250 mL/hr over 120 Minutes Intravenous  Once 01/19/15 1707 01/19/15 2159   01/19/15 1715  piperacillin-tazobactam (ZOSYN) IVPB 3.375 g     3.375 g 100 mL/hr over 30 Minutes Intravenous  Once 01/19/15 1707 01/19/15 1900      Assessment/Plan: GI - pull NG back - was at pylorus on CT to see if will help decrease  ngt output so we can hopefully do clears/enteral feeds soon - having BMs.   ID - f/u cultures from paracentesis. abx per ID. No fever. Wbc improving  Renal - uop improving but BUN/Cr increasing. Defer dialysis to renal  Heme - hgb dropped. Getting a unit. Will stop subcu heparin.   VTE prophylaxis - scds only at this point.   [name]. [name], MD, FACS General, Bariatric, & Minimally Invasive Surgery [facility], [place]   LOS: 29 days    Larry [name] 02/17/2015

## 2015-02-17 NOTE — Evaluation (Addendum)
SLP Cancellation Note  Patient Details Name: BROADUS COSTILLA MRN: [id number] DOB: [birth year]   Cancelled treatment:       Reason Eval/Treat Not Completed: Other (comment);Medical issues which prohibited therapy (pt continues with NG, possible ileus per RN, not ready for po, please reorder SLP evaluation when pt is ready.  Thanks.    Donavan Burnet, MS [facility] SLP [phone]

## 2015-02-17 NOTE — Progress Notes (Signed)
PULMONARY / CRITICAL CARE MEDICINE   Name: Larry [name] MRN: [id number] DOB: 12-16-1945    ADMISSION DATE:  01/19/2015 CONSULTATION DATE:  8/4  REFERRING MD :  [name]  CHIEF COMPLAINT:  Septic shock   INITIAL PRESENTATION:  69 year old male w/ CAF on Elliquis who recently underwent lap chole on 7/14. Was discharged home w/drain. Drain removed about a week before presentation to ER. Admitted directly from [facility] on 8/3 w/ septic shock which was likely due to infected hematoma in the GB fossa. PCCM asked to see after new perc drain placed on 8/4, when he remained hypotensive.  Required CRRT 8/23 for AKI  Intubated 8/26 for desaturations, significant agitation, delirium  STUDIES:  CT abdomen/Pelvis W/ 7/28 - Gas & fluid at cholecystectomy site w/o discrete abscess. Min ascites. Small right pleural eff. CT abdomen/pelvis w/o 8/5 - Gas between liver & stomach. No additional free air. Increased flow void & high density layering c/w increasing hemorrhage as well as increased right pleural eff. CT abdomen/pelvis 8/18 >> 9.8 cm hematoma with gas in the gallbladder fossa, indwelling pigtail drainage catheter, 12.6 cm hematoma along the posterior R liver, mod ascites, mild wall thickening involving the R colon CT abdomen/pelvis 8/22 >> stable perihepatic hematoma, stable gallbladder fossa collection despite adequate drainage cath placement, significant free fluid within the abdomen/pelvis slightly increased from prior exam 8/31 CT abd >>persistent large fluid collection at the gallbladder fossa 11.7 x 9.0 x 8.5 cm previously 9.7 x 9.7 x 8.8 cm. Additional subhepatic collection anteromedial to gallbladder collection containing air and fluid, decreased in size now measuring 5.0 x 5.0 x 4.4 cm, Significant ascites  SIGNIFICANT EVENTS: Admitted 8/3 w/ abd pain and hypotension  8/04  hypotensive over-night. Went for [facility] drain of GB fossa. Found what looked like old infected hematoma. PCCM  asked to see post-op for shock 8/05  off pressors. WOB worse.  8/06  WOB & wheezing slightly worse. Progressing abdominal pain 8/07  Hgb declining w/ increasing intraperitoneal hemorrhage 8/07  Transfused 2u PRBCs 8/09  work of breathing still significant w/ marked upper airway component  8/10  breathing better. 1.6 liters negative. Slight bump in scr. Vanc stopped. Getting OOB for first time 8/11  eating regular diet. Up in chair. Feeling better.  Respiratory status improved.  PCCM s/o 8/14  Renal consulted for rising sr cr (to 3.60), baseline sr cr 1.1-1.5 (prior IV contrast 7/28).   8/14  Concern for yeast / bacterial UTI  8/15  ID consulted > abx narrowed to zosyn only, fluconazole for yeast  8/16  UOP increased on IV lasix 8/18  NPO, sr cr improving, CT abd as above  8/18  GB drain upsized per IR to 16 Fr 8/19  Abd pain, eating well.  Diarrhea after ensure.  To PO lasix.  Discussed gastrostomy tube with IR 8/22  Sr Cr rising, intermittent vomiting.  CT abd as above.  Increased confusion 8/23  HD cath inserted, CRRT initiated.  Gastrostomy tube held off due to CRRT & Hgb drop 8/24  WBC elevated, cultures repeated.   8/24  Paracentesis >> 150 ml "bloody bile" fluid removed, loculated and difficult to aspirate 8/25  Palliative care consulted, PCCM consulted.  Vomiting with NGT insertion.  8/26  Intubated early am(0200) for desaturations, significant agitation, delirium  8/30 CRRT stopped, bilious vomiting/secretions 8/31 Paracentesis >> 3.1 L  SUBJECTIVE:   Afebrile Denies pain Remains critically ill, on neo gtt, off CRRT, making more urine  VITAL SIGNS: Temp:  [98.2 F (36.8 C)-98.9 F (37.2 C)] 98.2 F (36.8 C) (09/01 0907) Pulse Rate:  [101-118] 101 (09/01 0907) Resp:  [15-30] 24 (09/01 0907) BP: (88-140)/(38-103) 140/55 mmHg (09/01 0907) SpO2:  [88 %-99 %] 99 % (09/01 0907) Weight:  [254 lb 10.1 oz (115.5 kg)] 254 lb 10.1 oz (115.5 kg) (09/01 0500)  HEMODYNAMICS: CVP:   [8 mmHg-12 mmHg] 12 mmHg   VENTILATOR SETTINGS:     INTAKE / OUTPUT:  Intake/Output Summary (Last 24 hours) at 02/17/15 0932 Last data filed at 02/17/15 9604  Gross per 24 hour  Intake 3154.42 ml  Output   1986 ml  Net 1168.42 ml    PHYSICAL EXAMINATION: General:  Acutely ill adult male , in bed Neuro:  interactive, some confusion,anxious affect HEENT:  HD cath no discharge Cardiovascular:  s1s2 rrr, no m/r/g Lungs:  Clear, BL air entry + Abdomen:  RUQ perc drain in place with scant drainage. Hypoactive BS. Non- tender Musculoskeletal:  No joint effusions or deformities. Skin:  gen edema  LABS:  CBC  Recent Labs Lab 02/15/15 0430  02/15/15 0946 02/16/15 0445 02/17/15 0500  WBC 15.5*  --   --  10.7* 12.1*  HGB 7.0*  < > 8.5* 11.0* 6.5*  HCT 22.6*  < > 25.0* 34.1* 20.5*  PLT 114*  --   --  104* 185  < > = values in this interval not displayed. Coag's  Recent Labs Lab 02/15/15 0430 02/16/15 0445 02/17/15 0500  APTT 26 33 34   BMET  Recent Labs Lab 02/16/15 0445 02/16/15 1715 02/17/15 0500  NA 135 137 134*  K 4.9 4.8 4.5  CL 93* 94* 93*  CO2 33* 34* 30  BUN 71* 87* 95*  CREATININE 3.26* 3.60* 3.82*  GLUCOSE 138* 80 103*   Electrolytes  Recent Labs Lab 02/15/15 0430  02/16/15 0445 02/16/15 1715 02/17/15 0500  CALCIUM 7.3*  < > 7.9* 8.1* 7.7*  MG 2.0  --  2.3  --  2.2  PHOS 2.5  2.5  < > 4.9*  5.0* 6.2* 6.3*  < > = values in this interval not displayed. Sepsis Markers No results for input(s): LATICACIDVEN, PROCALCITON, O2SATVEN in the last 168 hours.   ABG  Recent Labs Lab 02/12/15 1205 02/13/15 1300 02/14/15 0842  PHART 7.428 7.384 7.420  PCO2ART 43.0 56.4* 52.3*  PO2ART 77.8* 72.2* 73.7*   Liver Enzymes  Recent Labs Lab 02/13/15 0415  02/14/15 0530  02/16/15 0445 02/16/15 1715 02/17/15 0500  AST 36  --  55*  --   --   --  47*  ALT 27  --  33  --   --   --  26  ALKPHOS 107  --  100  --   --   --  136*  BILITOT 1.6*  --   2.0*  --   --   --  1.7*  ALBUMIN 1.9*  2.0*  < > 1.9*  1.9*  < > 1.7* 2.1* 1.8*  < > = values in this interval not displayed.   Cardiac Enzymes No results for input(s): TROPONINI, PROBNP in the last 168 hours.   Glucose  Recent Labs Lab 02/16/15 1252 02/16/15 1738 02/16/15 2021 02/17/15 0019 02/17/15 0429 02/17/15 0741  GLUCAP 93 75 141* 134* 104* 107*    Imaging Ct Abdomen Pelvis Wo Contrast  02/16/2015   CLINICAL DATA:  Six weeks post laparoscopic cholecystectomy complicated by a gallbladder fossa hematoma, followup, past surgical  drain that was pulled, now having vomiting and abdominal pain, unable to keep oral intake down, past history hypertension, atrial fibrillation, coronary artery disease post MI  EXAM: CT ABDOMEN AND PELVIS WITHOUT CONTRAST  TECHNIQUE: Multidetector CT imaging of the abdomen and pelvis was performed following the standard protocol without IV contrast. Sagittal and coronal MPR images reconstructed from axial data set. Patient drank a small amount of dilute oral contrast for exam.  COMPARISON:  02/07/2015  FINDINGS: Small RIGHT pleural effusion with bibasilar atelectasis.  Subcapsular fluid collection at lateral and posterior aspects of liver extending to dome, stable.  Pigtail drainage catheter identified within a persistent large fluid collection at the gallbladder fossa 11.7 x 9.0 x 8.5 cm previously 9.7 x 9.7 x 8.8 cm.  Additional subhepatic collection anteromedial to gallbladder collection containing air and fluid, decreased in size now measuring 5.0 x 5.0 x 4.4 cm, uncertain if communicating with the gallbladder fossa collection, previously measured 6.6 x 6.8 x 6.8 cm.  Multiple low-attenuation fluid collections without gas are seen throughout the abdomen including large amount of fluid at the gutters into the pelvis, interloop, perigastric, and anteriorly in the upper and mid abdomen, probably partially loculated at multiple sites.  Nasogastric tube extends  to the pylorus.  Several foci of extraluminal gas are identified anterior to the gallbladder fossa collection, potentially related to the abscess collection and catheter drainage, without free intraperitoneal air at additional sites.  Spleen, pancreas, kidneys and adrenal glands unremarkable.  Scattered atherosclerotic calcifications.  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  Bladder decompressed by urinary bladder.  No mass, adenopathy, hernia or acute bone lesion.  IMPRESSION: Persistent large gas and fluid collection at the gallbladder fossa slightly larger than on previous exam despite pigtail drainage catheter.  Interval mild decrease in size of a collection in anteromedial to the gallbladder fossa collection, question communicating with drainage catheter.  Significant ascites, question partially loculated at sites in the upper abdomen.  Several small foci of gas are seen anterior to the gallbladder fossa collection, question related to percutaneous drainage, without additional free intraperitoneal air identified.   Electronically Signed   By: Ulyses Southward M.D.   On: 02/16/2015 11:24   US Paracentesis  02/16/2015   INDICATION: Patient with recent history of sepsis, gallbladder fossa abscess drainage, renal insufficiency, ascites. Request made for diagnostic and therapeutic paracentesis.  EXAM: ULTRASOUND-GUIDED DIAGNOSTIC AND THERAPEUTIC PARACENTESIS  COMPARISON:  Prior paracentesis on 02/09/2015  MEDICATIONS: None.  COMPLICATIONS: None immediate  TECHNIQUE: Informed written consent was obtained from the patient after a discussion of the risks, benefits and alternatives to treatment. A timeout was performed prior to the initiation of the procedure.  Initial ultrasound scanning demonstrates a moderate-to-large amount of ascites within the right lower abdominal quadrant. The right lower abdomen was prepped and draped in the usual sterile fashion. 1% lidocaine was used for local anesthesia.  Under direct ultrasound guidance, a 19 gauge, 10-cm, Yueh catheter was introduced. An ultrasound image was saved for documentation purposed. The paracentesis was performed. The catheter was removed and a dressing was applied. The patient tolerated the procedure well without immediate post procedural complication.  FINDINGS: A total of approximately 3.1 liters of dark, bloody fluid was removed. Samples were sent to the laboratory as requested by the clinical team.  IMPRESSION: Successful ultrasound-guided diagnostic and therapeutic paracentesis yielding 3.1 liters of peritoneal fluid. Critical care medicine notified of the above findings. The patient was given IV albumin during the procedure.  Read by: Jeananne Rama, PA-C   Electronically Signed   By: Irish Lack M.D.   On: 02/16/2015 16:56    ASSESSMENT / PLAN:  PULMONARY A: ETT 8/26 >> 8/29 Acute Hypoxic Respiratory Failure - in the setting of significant agitation, renal fx, delirium  - resolved Chronically elevated right HD Right pleural effusion - progessing on Abd CT 8/6 P:   Xopenex neb q6hr PRN IS q 1h when awake   CARDIOVASCULAR CVL Right IJ 8/4 >> 8/22 RUE PICC 8/22 >>  A:  Severe sepsis/septic shock - resolved Hypotension - 8/26, started on neo post intubation, smouldering sepsis Hx PAF - CHADVASC score 4; currently NSR  Prior NSTEMI  Pressor needed 8/27, prece dex related>? Adrenal insuff P:  Taper Neo for MAP > 60 -taper Dc stress steroids dc vaso  RENAL A:   AKI- in setting of septic shock, prior hypotension/ATN Non oliguric now  P:   Off Cvvhd,  Making more urine -BUN rising, follow, will have pos balance with TNA Hope for renal recovery here  GASTROINTESTINAL A:   Infected hematoma s/p cholecystectomy - s/p CT guided Perc drain with subsequent upsize to 16 Fr, Paracentesis 8/31 c/w peritonitis GERD - worse 8/5 w/ associated UAW wheeze Intra-abdominal hemorrhage / hematoma  Nausea / Vomiting - concern for  ileus (8/25) Protein Calorie Malnutrition   P:   TNA per pharmacy  Protonix QD NPO Ct NG to LIS   HEMATOLOGIC A:   Anemia - in setting of intra-abdominal hemorrhage (8/4).  S/p 2 u PRBC on 8/4,2u 8/7  , 1 U 9/1 P:  Trend CBC  SCDs Transfuse for Hbg < 7  INFECTIOUS A:   Severe sepsis/septic shock - presume source is infected hematoma from recent Cholecystectomy. Shock resolved.  S/p Perc GB fossa site drain 8/4 - minimal serous output High Risk for Fungemia P:   Trending WBC / Fever curve  ID following, appreciate input   Abscess 8/4>>>neg BCX2 8/4>>>neg  Peritoneal fluid GS 8/24 >> abundant WBC, no organisms seen Peritoneal fluid culture 8/24 >>  BCx2 8/25 >> ng Urine 8/14 >> yeast  Ascitic fluid 8/31 >  Meropenem 8/24 >>  Fluconazole 8/15 >> 8/23 anigulofungin 8/26>>> vanc 8/28>>>8/31    ENDOCRINE A: DM - CBG's better  controlled AI P:   SSI Q4 ct Lantus 40 units  Added insulin to TNA   NEUROLOGIC A:  Delirium  Critical Illness Deconditioning  P:   RASS goal: 0 Delirium prevention measures: minimize sedating medications, promote sleep / wake cycle  Fentanyl prn PT cons -oob to chair, SNF eventual   FAMILY  - Updates:  D-in law 8/30  - Inter-disciplinary family meet or Palliative Care meeting due by:  Ongoing, family wants to continue full aggressive measures.  Hopeful that patient will be able to recover.     Summary - recovering MODS, extubated, on break from CRRT -now non oliguric waiting for renal recovery, smouldering sepsis from intra abd abscess & peritonitis  The patient is critically ill with multiple organ systems failure and requires high complexity decision making for assessment and support, frequent evaluation and titration of therapies, application of advanced monitoring technologies and extensive interpretation of multiple databases. Critical Care Time devoted to patient care services described in this note independent of APP time  is 31 minutes.    Cyril Mourning MD. Tonny Bollman. [place] Pager [phone] If no response call [phone]

## 2015-02-17 NOTE — Progress Notes (Signed)
Nutrition Follow-up  DOCUMENTATION CODES:   Severe malnutrition in context of acute illness/injury, Morbid obesity  INTERVENTION:   Await decision of PO vs. Enteral nutrition  TPN per Pharmacy  TF recommendations if needed: Initiate Vital 1.5 @ 10 ml/hr and increase by 10 ml every 8 hours to goal rate of 55 ml/hr.  30 ml Prostat daily.  -RD to continue to monitor  NUTRITION DIAGNOSIS:   Inadequate oral intake related to inability to eat as evidenced by NPO status.  Ongoing.  GOAL:   Patient will meet greater than or equal to 90% of their needs  Meeting with TPN  MONITOR:   Labs, Weight trends, Skin, I & O's, Other (Comment) (TPN regimen)  ASSESSMENT:   69 y/o admitted in 10/05/2012 with Sepsis, hypotension, acute renal failure and acute cholecystitis. He had been place on Eliquis for his AF by the [place]. He had an MI 2 years ago and did not follow up with his cardiologist. He was acutely ill and underwent a percutaneous drain placement on 10/06/14 by IR. He went home on 10/11/14. He was readmitted on 12/30/14 and underwent laparoscopic cholecystectomy with IOC. He was left with a drain in place and the site had "SNOW' placed in the GB fossa for hemostasis after the procedure. Drain was removed last week but there was some brusing at the site. He is transferred from [facility] with complaints of increased abdominal pain.  Pt was intubated from 8/26 - 8/29. Pt off CRRT.  Speech unable to perform eval d/t pt's NGT. Pt on NGT d/t ileus. Pt is having BMs.  Pt to continue TPN. TPN infusing @ 80 ml/hr + 20% lipid infusion @ 10 ml/hr. Provides 96g protein and 1843 kcal.  Plan per Pharmacy: At 1800 today:  Continue Clinimix 5/15 (WITHOUT lytes) at 80 ml/hr. Patient is off of CRRT, watching for recovery. SCr and BUN increasing. Protein supplementation at ~1.4 g/kg/day of adjusted body weight. Concerned that reducing protein supplementation further would not benefit this  acutely ill patient. BUN may rise regardless of exogenous protein supplementation in this catabolic patient; however, if BUN continues to increase, may consider reducing protein provided by TPN.  Continue 20% lipid emulsion at 10 ml/hr. Held lipids for first week of ICU admission. Resumed 8/30.   F/u daily. Hopefully, patient can be switched to enteral nutrition soon. Patient is having BMs.  Labs reviewed: CBGs: 104-130 Low Na Elevated BUN & Creatinine  Diet Order:  Diet NPO time specified Except for: Ice Chips TPN (CLINIMIX) Adult without lytes TPN (CLINIMIX) Adult without lytes  Skin:  Wound (see comment) (abdominal incision)  Last BM:  9/1  Height:   Ht Readings from Last 1 Encounters:  01/20/15  (1.676 m)    Weight:   Wt Readings from Last 1 Encounters:  02/17/15 254 lb 10.1 oz (115.5 kg)    Ideal Body Weight:  64.54 kg (kg)  BMI:  Body mass index is 41.12 kg/(m^2).  Estimated Nutritional Needs:   Kcal:  1950-2150  Protein:  95-105g  Fluid:  2L/day  EDUCATION NEEDS:   No education needs identified at this time  Tilda Franco, MS, RD, LDN Pager: [phone] After Hours Pager: [phone]

## 2015-02-17 NOTE — Progress Notes (Signed)
Physical Therapy Treatment Patient Details Name: Larry [name] MRN: [id number] DOB: Jan 09, 1946 Today's Date: 02/17/2015    History of Present Illness 69 year old male w/ CAF on elliquis recently underwent lap chole on 7/14. Was discharged to home w/drain. Drain removed about a week before presentation to ER. Admitted directly  from [facility] on 8/3 w/ septic shock which was likely due to infected hematoma in the GB fossa.  new perc drain placed on 8/4, when he remained hypotensive Pt undergoing CRRT.     PT Comments    Assisted pt OOB to amb + 3 assist using B platform EVA walker for increased support and attempt increased distance/activity.   Pt VERY weak and deconditioned with extended LOS and multiple medical issues.  Progressing slowly.    Follow Up Recommendations  SNF     Equipment Recommendations       Recommendations for Other Services       Precautions / Restrictions Precautions Precautions: Fall Precaution Comments: JP drain, monitor HR, multiple lines Restrictions Weight Bearing Restrictions: No    Mobility  Bed Mobility Overal bed mobility: Needs Assistance Bed Mobility: Supine to Sit;Sit to Supine     Supine to sit: +2 for physical assistance;Total assist Sit to supine: Total assist;+2 for physical assistance   General bed mobility comments: assist to get legs to EOB and to raise trunk, HOB up 50*.  Increased time  Transfers Overall transfer level: Needs assistance Equipment used: None Transfers: Sit to/from Stand Sit to Stand: +2 physical assistance;Max assist         General transfer comment: rise from elevated bed + 2 sisde by side assist with EVA walker in front.  Hand over hand assist for placement  Ambulation/Gait Ambulation/Gait assistance: Max assist;+2 physical assistance Ambulation Distance (Feet): 24 Feet Assistive device: Bilateral platform walker (EVA walker) Gait Pattern/deviations: Step-to pattern;Decreased step length -  right;Decreased step length - left;Trunk flexed;Shuffle     General Gait Details: Very weak.  Used B platform EVA walker for increased support and to increase activity tolerance/gait distance.  NT assisted by following with recliner.  Fatigues easily.  Very deconditioned with extended LOS.   Stairs            Wheelchair Mobility    Modified Rankin (Stroke Patients Only)       Balance                                    Cognition Arousal/Alertness: Awake/alert Behavior During Therapy: Anxious Overall Cognitive Status: Impaired/Different from baseline Area of Impairment: Safety/judgement                    Exercises      General Comments        Pertinent Vitals/Pain Pain Assessment: No/denies pain    Home Living                      Prior Function            PT Goals (current goals can now be found in the care plan section) Progress towards PT goals: Progressing toward goals    Frequency  Min 3X/week    PT Plan      Co-evaluation             End of Session Equipment Utilized During Treatment: Oxygen Activity Tolerance: Patient limited by fatigue;Treatment limited  secondary to medical complications (Comment) Patient left: in bed;with call bell/phone within reach;with nursing/sitter in room     Time: 1400-1427 PT Time Calculation (min) (ACUTE ONLY): 27 min  Charges:  $Gait Training: 8-22 mins $Therapeutic Activity: 8-22 mins                    G Codes:      Felecia Shelling  PTA [facility]  Acute  Rehab Pager      [phone]

## 2015-02-17 NOTE — Progress Notes (Signed)
eLink Physician-Brief Progress Note Patient Name: Larry [name] DOB: [birth year] MRN: [id number]   Date of Service  02/17/2015  HPI/Events of Note  anemia  eICU Interventions  Transfuse 1 unit blood.      Intervention Category Major Interventions: Hemorrhage - evaluation and management  Shane Crutch 02/17/2015, 6:15 AM
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
## 2015-02-17 NOTE — Progress Notes (Signed)
CRITICAL VALUE ALERT  Critical value received:  Hgb 6.9  Date of notification:  02/17/15  Time of notification:  0611  Critical value read back:yes  Nurse who received alert:  Barkley Bruns RN  MD notified (1st page):  Nicholos Johns  Time of first page:  0612  MD notified (2nd page):0612 Time of second page:  Responding MD:  Nicholos Johns  Time MD responded:  [phone]

## 2015-02-17 NOTE — Progress Notes (Signed)
ANTIBIOTIC CONSULT NOTE  Pharmacy Consult for Vancomycin, Zosyn Indication: infected hematoma s/p cholecystectomy/IAI  No Known Allergies  Patient Measurements: Height: 5\' 6"  (167.6 cm) Weight: 254 lb 10.1 oz (115.5 kg) IBW/kg (Calculated) : 63.8   Vital Signs: Temp: 98.3 F (36.8 C) (09/01 1130) Temp Source: Oral (09/01 1130) BP: 114/57 mmHg (09/01 1130) Pulse Rate: 100 (09/01 1130) Intake/Output from previous day: 08/31 0701 - 09/01 0700 In: 3397.2 [I.V.:550.1; NG/GT:210; IV Piggyback:330; TPN:2272.1] Out: 2046 [Urine:1245; Emesis/NG output:800; Stool:1] Intake/Output from this shift: Total I/O In: 940.6 [I.V.:100.6; Blood:360; NG/GT:30; TPN:450] Out: 395 [Urine:395]  Labs:  Recent Labs  02/15/15 0430  02/15/15 0946  02/16/15 0445 02/16/15 1715 02/17/15 0500  WBC 15.5*  --   --   --  10.7*  --  12.1*  HGB 7.0*  < > 8.5*  --  11.0*  --  6.5*  PLT 114*  --   --   --  104*  --  185  CREATININE 1.90*  < > 1.40*  < > 3.26* 3.60* 3.82*  < > = values in this interval not displayed. Estimated Creatinine Clearance: 22.1 mL/min (by C-G formula based on Cr of 3.82).  Recent Labs  02/17/15 1220  VANCORANDOM 15     Microbiology: Recent Results (from the past 720 hour(s))  Culture, routine-abscess     Status: None   Collection Time: 01/20/15  1:00 PM  Result Value Ref Range Status   Specimen Description ABSCESS GALL BLADDER FOSSA  Final   Special Requests Normal  Final   Gram Stain   Final    ABUNDANT WBC PRESENT,BOTH PMN AND MONONUCLEAR NO SQUAMOUS EPITHELIAL CELLS SEEN NO ORGANISMS SEEN Performed at [facility]    Culture   Final    MULTIPLE ORGANISMS PRESENT, NONE PREDOMINANT Note: NO STAPHYLOCOCCUS AUREUS ISOLATED NO GROUP A STREP (S.PYOGENES) ISOLATED Performed at [facility]    Report Status 01/23/2015 FINAL  Final  Anaerobic culture     Status: None   Collection Time: 01/20/15  1:00 PM  Result Value Ref Range Status   Specimen  Description ABSCESS GALL BLADDER FOSSA  Final   Special Requests Normal  Final   Gram Stain   Final    ABUNDANT WBC PRESENT,BOTH PMN AND MONONUCLEAR NO SQUAMOUS EPITHELIAL CELLS SEEN NO ORGANISMS SEEN Performed at [facility]    Culture   Final    NO ANAEROBES ISOLATED Performed at [facility]    Report Status 01/26/2015 FINAL  Final  MRSA PCR Screening     Status: None   Collection Time: 01/20/15  1:39 PM  Result Value Ref Range Status   MRSA by PCR NEGATIVE NEGATIVE Final    Comment:        The GeneXpert MRSA Assay (FDA approved for NASAL specimens only), is one component of a comprehensive MRSA colonization surveillance program. It is not intended to diagnose MRSA infection nor to guide or monitor treatment for MRSA infections. Performed at [facility]   Culture, blood (routine x 2)     Status: None   Collection Time: 01/20/15  1:50 PM  Result Value Ref Range Status   Specimen Description BLOOD RIGHT ARM  Final   Special Requests IN PEDIATRIC BOTTLE 4CC  Final   Culture   Final    NO GROWTH 5 DAYS Performed at [facility]    Report Status 01/25/2015 FINAL  Final  Culture, blood (routine x 2)     Status: None  Collection Time: 01/20/15  1:55 PM  Result Value Ref Range Status   Specimen Description BLOOD RIGHT HAND  Final   Special Requests IN PEDIATRIC BOTTLE 4CC  Final   Culture   Final    NO GROWTH 5 DAYS Performed at [facility]    Report Status 01/25/2015 FINAL  Final  Urine culture     Status: None   Collection Time: 01/30/15 11:40 AM  Result Value Ref Range Status   Specimen Description URINE, RANDOM  Final   Special Requests NONE  Final   Culture   Final    >=100,000 COLONIES/mL YEAST Performed at [facility]    Report Status 02/01/2015 FINAL  Final  Culture, body fluid-bottle     Status: None   Collection Time: 02/09/15  2:10 PM  Result Value Ref Range Status   Specimen Description FLUID  PERITONEAL  Final   Special Requests BOTTLES DRAWN AEROBIC AND ANAEROBIC 10CC  Final   Culture   Final    NO GROWTH 5 DAYS Performed at [facility]    Report Status 02/14/2015 FINAL  Final  Gram stain     Status: None   Collection Time: 02/09/15  2:10 PM  Result Value Ref Range Status   Specimen Description FLUID PERITONEAL  Final   Special Requests BOTTLES DRAWN AEROBIC AND ANAEROBIC 10CC  Final   Gram Stain   Final    ABUNDANT WBC PRESENT,BOTH PMN AND MONONUCLEAR NO ORGANISMS SEEN Performed at [facility]    Report Status 02/09/2015 FINAL  Final  Culture, blood (routine x 2)     Status: None   Collection Time: 02/10/15  4:00 PM  Result Value Ref Range Status   Specimen Description BLOOD LEFT ANTECUBITAL  Final   Special Requests BOTTLES DRAWN AEROBIC ONLY 7CC  Final   Culture   Final    NO GROWTH 5 DAYS Performed at [facility]    Report Status 02/15/2015 FINAL  Final  Culture, blood (routine x 2)     Status: None   Collection Time: 02/10/15  4:15 PM  Result Value Ref Range Status   Specimen Description BLOOD LEFT FOREARM  Final   Special Requests BOTTLES DRAWN AEROBIC ONLY 5CC  Final   Culture   Final    NO GROWTH 5 DAYS Performed at [facility]    Report Status 02/15/2015 FINAL  Final  Culture, body fluid-bottle     Status: None (Preliminary result)   Collection Time: 02/16/15  3:56 PM  Result Value Ref Range Status   Specimen Description FLUID PERITONEAL  Final   Special Requests NONE  Final   Culture   Final    NO GROWTH < 24 HOURS Performed at [facility]    Report Status PENDING  Incomplete  Gram stain     Status: None   Collection Time: 02/16/15  3:56 PM  Result Value Ref Range Status   Specimen Description FLUID PERITONEAL  Final   Special Requests NONE  Final   Gram Stain   Final    ABUNDANT WBC PRESENT,BOTH PMN AND MONONUCLEAR NO ORGANISMS SEEN Performed at [facility]    Report Status 02/16/2015  FINAL  Final   Assessment: 86 yoM presents with increased abdominal pain since cholecystectomy last month. Patient was discharged with a drain in place which was removed last week.  Patient transferred from [facility] on 8/3 with septic shock likely due to infected hematoma in the GB  fossa. Antibiotics started for intra-abdominal infection, possible UTI.   8/3 >> Vanc >>  8/10 8/3 >> Zosyn >> 8/24; 8/31 8/17 >> Diflucan >> 8/23 8/24 >> Meropenem >>8/31 8/25 >> Anidulafungin >> 8/28 >> Vancomycin >> 8/31 >> Zosyn >>  8/4 blood x 2: neg FINAL 8/4 GB fossa abscess: multiple organisms present, none predominant. No Staph aureus or Group A strep (S. Pyogenes) isolated 8/4 abscess (anaerobic): neg FINAL 8/4 MRSA PCR: negative 8/14 urine: yeast-final 8/24 peritoneal fluid: no growth, final 8/25 blood x 2: no growth, final 8/31 peritoneal fluid cx: ngtd  Today, 02/17/2015 - Day 30 antibiotics - Tmax24h: 100.3 - WBC: elevated, hydrocortisone tapered off yesterday - Renal: CRRT stopped 8/30 AM, watching for recovery. SCr up to 3.82 this morning.  Patient is making urine.  - Vancomycin level = 15 mcg/ml 48h after 1g given off of CRRT  GOALS Vancomycin trough 15-20 mcg/ml Doses adjusted per renal function Eradication of infection  Plan:  - Administer Vancomycin 1g IV x 1 now.  F/u SCr tomorrow AM to determine when to check next level and re-dose.  Dosing based on levels due to variable renal function. - Continue Zosyn 3.375g IV q8h (4 hour infusion time).  - Continue anidulafungin 100 mg IV q24h - Continue to monitor SCr daily and reassess abx dosing.   - F/u ID recommendations  Clance Boll, PharmD, BCPS Pager: [phone] 02/17/2015 1:08 PM

## 2015-02-17 NOTE — Progress Notes (Signed)
[facility] ROUNDING NOTE   Subjective:   Interval History: no complaints today still with NGT  Weaning pressors and good urine output  Objective:  Vital signs in last 24 hours:  Temp:  [98.2 F (36.8 C)-98.9 F (37.2 C)] 98.3 F (36.8 C) (09/01 1130) Pulse Rate:  [99-118] 100 (09/01 1130) Resp:  [15-30] 20 (09/01 1130) BP: (88-140)/(38-103) 114/57 mmHg (09/01 1130) SpO2:  [93 %-99 %] 95 % (09/01 1130) Weight:  [115.5 kg (254 lb 10.1 oz)] 115.5 kg (254 lb 10.1 oz) (09/01 0500)  Weight change: -1 kg (-2 lb 3.3 oz) Filed Weights   02/15/15 0456 02/16/15 0441 02/17/15 0500  Weight: 117.9 kg (259 lb 14.8 oz) 116.5 kg (256 lb 13.4 oz) 115.5 kg (254 lb 10.1 oz)    Intake/Output: I/O last 3 completed shifts: In: 5075.1 [I.V.:797.9; Other:35; NG/GT:270; IV Piggyback:380] Out: 3061 [Urine:1400; Emesis/NG output:1650; Drains:10; Stool:1]   Intake/Output this shift:  Total I/O In: 940.6 [I.V.:100.6; Blood:360; NG/GT:30; TPN:450] Out: 395 [Urine:395]  CVS- RRR RS- CTA ABD- BS present soft non-distended EXT- no edema   Basic Metabolic Panel:  Recent Labs Lab 02/13/15 0415  02/14/15 0530  02/15/15 0430  02/15/15 0946 02/15/15 1815 02/16/15 0445 02/16/15 1715 02/17/15 0500  NA 134*  134*  < > 135  137  < > 136  < > 139 136 135 137 134*  K 4.1  4.1  < > 4.3  4.3  < > 4.5  < > 4.1 4.6 4.9 4.8 4.5  CL 87*  86*  < > 94*  93*  < > 93*  < > 89* 93* 93* 94* 93*  CO2 36*  36*  < > 36*  36*  --  36*  --   --  34* 33* 34* 30  GLUCOSE 450*  445*  < > 150*  146*  < > 184*  < > 230* 131* 138* 80 103*  BUN 33*  34*  < > 38*  38*  < > 41*  < > 30* 55* 71* 87* 95*  CREATININE 2.14*  2.23*  < > 2.06*  2.06*  < > 1.90*  < > 1.40* 2.68* 3.26* 3.60* 3.82*  CALCIUM 9.6  9.5  < > 8.1*  8.2*  --  7.3*  --   --  7.8* 7.9* 8.1* 7.7*  MG 2.0  --  2.0  --  2.0  --   --   --  2.3  --  2.2  PHOS 3.1  3.1  < > 3.1  3.1  --  2.5  2.5  --   --  3.2 4.9*  5.0* 6.2*  6.3*  < > = values in this interval not displayed.  Liver Function Tests:  Recent Labs Lab 02/13/15 0415  02/14/15 0530 02/15/15 0430 02/15/15 1815 02/16/15 0445 02/16/15 1715 02/17/15 0500  AST 36  --  55*  --   --   --   --  47*  ALT 27  --  33  --   --   --   --  26  ALKPHOS 107  --  100  --   --   --   --  136*  BILITOT 1.6*  --  2.0*  --   --   --   --  1.7*  PROT 6.4*  --  6.5  --   --   --   --  5.7*  ALBUMIN 1.9*  2.0*  < > 1.9*  1.9* 1.7* 1.8* 1.7* 2.1* 1.8*  < > = values in this interval not displayed. No results for input(s): LIPASE, AMYLASE in the last 168 hours. No results for input(s): AMMONIA in the last 168 hours.  CBC:  Recent Labs Lab 02/11/15 0600  02/13/15 0415  02/14/15 0530  02/15/15 0430 02/15/15 0622 02/15/15 0916 02/15/15 0946 02/16/15 0445 02/17/15 0500  WBC 22.3*  < > 23.8*  --  25.1*  --  15.5*  --   --   --  10.7* 12.1*  NEUTROABS 20.1*  --  18.7*  --  20.5*  --   --   --   --   --   --   --   HGB 8.5*  < > 8.7*  < > 7.6*  < > 7.0* 8.5* 7.8* 8.5* 11.0* 6.5*  HCT 26.2*  < > 27.6*  < > 23.8*  < > 22.6* 25.0* 23.0* 25.0* 34.1* 20.5*  MCV 91.9  < > 92.6  --  94.4  --  95.0  --   --   --  95.5 95.3  PLT 413*  < > 160  --  156  --  114*  --   --   --  104* 185  < > = values in this interval not displayed.  Cardiac Enzymes: No results for input(s): CKTOTAL, CKMB, CKMBINDEX, TROPONINI in the last 168 hours.  BNP: Invalid input(s): POCBNP  CBG:  Recent Labs Lab 02/16/15 2021 02/17/15 0019 02/17/15 0429 02/17/15 0741 02/17/15 1149  GLUCAP 141* 134* 104* 107* 130*    Microbiology: Results for orders placed or performed during the hospital encounter of 01/19/15  Culture, routine-abscess     Status: None   Collection Time: 01/20/15  1:00 PM  Result Value Ref Range Status   Specimen Description ABSCESS GALL BLADDER FOSSA  Final   Special Requests Normal  Final   Gram Stain   Final    ABUNDANT WBC PRESENT,BOTH PMN AND  MONONUCLEAR NO SQUAMOUS EPITHELIAL CELLS SEEN NO ORGANISMS SEEN Performed at [facility]    Culture   Final    MULTIPLE ORGANISMS PRESENT, NONE PREDOMINANT Note: NO STAPHYLOCOCCUS AUREUS ISOLATED NO GROUP A STREP (S.PYOGENES) ISOLATED Performed at [facility]    Report Status 01/23/2015 FINAL  Final  Anaerobic culture     Status: None   Collection Time: 01/20/15  1:00 PM  Result Value Ref Range Status   Specimen Description ABSCESS GALL BLADDER FOSSA  Final   Special Requests Normal  Final   Gram Stain   Final    ABUNDANT WBC PRESENT,BOTH PMN AND MONONUCLEAR NO SQUAMOUS EPITHELIAL CELLS SEEN NO ORGANISMS SEEN Performed at [facility]    Culture   Final    NO ANAEROBES ISOLATED Performed at [facility]    Report Status 01/26/2015 FINAL  Final  MRSA PCR Screening     Status: None   Collection Time: 01/20/15  1:39 PM  Result Value Ref Range Status   MRSA by PCR NEGATIVE NEGATIVE Final    Comment:        The GeneXpert MRSA Assay (FDA approved for NASAL specimens only), is one component of a comprehensive MRSA colonization surveillance program. It is not intended to diagnose MRSA infection nor to guide or monitor treatment for MRSA infections. Performed at [facility]   Culture, blood (routine x 2)     Status: None   Collection Time: 01/20/15  1:50 PM  Result Value Ref  Range Status   Specimen Description BLOOD RIGHT ARM  Final   Special Requests IN PEDIATRIC BOTTLE 4CC  Final   Culture   Final    NO GROWTH 5 DAYS Performed at [facility]    Report Status 01/25/2015 FINAL  Final  Culture, blood (routine x 2)     Status: None   Collection Time: 01/20/15  1:55 PM  Result Value Ref Range Status   Specimen Description BLOOD RIGHT HAND  Final   Special Requests IN PEDIATRIC BOTTLE 4CC  Final   Culture   Final    NO GROWTH 5 DAYS Performed at [facility]    Report Status 01/25/2015 FINAL  Final  Urine  culture     Status: None   Collection Time: 01/30/15 11:40 AM  Result Value Ref Range Status   Specimen Description URINE, RANDOM  Final   Special Requests NONE  Final   Culture   Final    >=100,000 COLONIES/mL YEAST Performed at [facility]    Report Status 02/01/2015 FINAL  Final  Culture, body fluid-bottle     Status: None   Collection Time: 02/09/15  2:10 PM  Result Value Ref Range Status   Specimen Description FLUID PERITONEAL  Final   Special Requests BOTTLES DRAWN AEROBIC AND ANAEROBIC 10CC  Final   Culture   Final    NO GROWTH 5 DAYS Performed at [facility]    Report Status 02/14/2015 FINAL  Final  Gram stain     Status: None   Collection Time: 02/09/15  2:10 PM  Result Value Ref Range Status   Specimen Description FLUID PERITONEAL  Final   Special Requests BOTTLES DRAWN AEROBIC AND ANAEROBIC 10CC  Final   Gram Stain   Final    ABUNDANT WBC PRESENT,BOTH PMN AND MONONUCLEAR NO ORGANISMS SEEN Performed at [facility]    Report Status 02/09/2015 FINAL  Final  Culture, blood (routine x 2)     Status: None   Collection Time: 02/10/15  4:00 PM  Result Value Ref Range Status   Specimen Description BLOOD LEFT ANTECUBITAL  Final   Special Requests BOTTLES DRAWN AEROBIC ONLY 7CC  Final   Culture   Final    NO GROWTH 5 DAYS Performed at [facility]    Report Status 02/15/2015 FINAL  Final  Culture, blood (routine x 2)     Status: None   Collection Time: 02/10/15  4:15 PM  Result Value Ref Range Status   Specimen Description BLOOD LEFT FOREARM  Final   Special Requests BOTTLES DRAWN AEROBIC ONLY 5CC  Final   Culture   Final    NO GROWTH 5 DAYS Performed at [facility]    Report Status 02/15/2015 FINAL  Final  Culture, body fluid-bottle     Status: None (Preliminary result)   Collection Time: 02/16/15  3:56 PM  Result Value Ref Range Status   Specimen Description FLUID PERITONEAL  Final   Special Requests NONE  Final    Culture   Final    NO GROWTH < 24 HOURS Performed at [facility]    Report Status PENDING  Incomplete  Gram stain     Status: None   Collection Time: 02/16/15  3:56 PM  Result Value Ref Range Status   Specimen Description FLUID PERITONEAL  Final   Special Requests NONE  Final   Gram Stain   Final    ABUNDANT WBC PRESENT,BOTH PMN AND MONONUCLEAR  NO ORGANISMS SEEN Performed at [facility]    Report Status 02/16/2015 FINAL  Final    Coagulation Studies: No results for input(s): LABPROT, INR in the last 72 hours.  Urinalysis: No results for input(s): COLORURINE, LABSPEC, PHURINE, GLUCOSEU, HGBUR, BILIRUBINUR, KETONESUR, PROTEINUR, UROBILINOGEN, NITRITE, LEUKOCYTESUR in the last 72 hours.  Invalid input(s): APPERANCEUR    Imaging: Ct Abdomen Pelvis Wo Contrast  02/16/2015   CLINICAL DATA:  Six weeks post laparoscopic cholecystectomy complicated by a gallbladder fossa hematoma, followup, past surgical drain that was pulled, now having vomiting and abdominal pain, unable to keep oral intake down, past history hypertension, atrial fibrillation, coronary artery disease post MI  EXAM: CT ABDOMEN AND PELVIS WITHOUT CONTRAST  TECHNIQUE: Multidetector CT imaging of the abdomen and pelvis was performed following the standard protocol without IV contrast. Sagittal and coronal MPR images reconstructed from axial data set. Patient drank a small amount of dilute oral contrast for exam.  COMPARISON:  02/07/2015  FINDINGS: Small RIGHT pleural effusion with bibasilar atelectasis.  Subcapsular fluid collection at lateral and posterior aspects of liver extending to dome, stable.  Pigtail drainage catheter identified within a persistent large fluid collection at the gallbladder fossa 11.7 x 9.0 x 8.5 cm previously 9.7 x 9.7 x 8.8 cm.  Additional subhepatic collection anteromedial to gallbladder collection containing air and fluid, decreased in size now measuring 5.0 x 5.0 x 4.4 cm, uncertain if  communicating with the gallbladder fossa collection, previously measured 6.6 x 6.8 x 6.8 cm.  Multiple low-attenuation fluid collections without gas are seen throughout the abdomen including large amount of fluid at the gutters into the pelvis, interloop, perigastric, and anteriorly in the upper and mid abdomen, probably partially loculated at multiple sites.  Nasogastric tube extends to the pylorus.  Several foci of extraluminal gas are identified anterior to the gallbladder fossa collection, potentially related to the abscess collection and catheter drainage, without free intraperitoneal air at additional sites.  Spleen, pancreas, kidneys and adrenal glands unremarkable.  Scattered atherosclerotic calcifications.  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  Bladder decompressed by urinary bladder.  No mass, adenopathy, hernia or acute bone lesion.  IMPRESSION: Persistent large gas and fluid collection at the gallbladder fossa slightly larger than on previous exam despite pigtail drainage catheter.  Interval mild decrease in size of a collection in anteromedial to the gallbladder fossa collection, question communicating with drainage catheter.  Significant ascites, question partially loculated at sites in the upper abdomen.  Several small foci of gas are seen anterior to the gallbladder fossa collection, question related to percutaneous drainage, without additional free intraperitoneal air identified.   Electronically Signed   By: Ulyses Southward M.D.   On: 02/16/2015 11:24   US Paracentesis  02/16/2015   INDICATION: Patient with recent history of sepsis, gallbladder fossa abscess drainage, renal insufficiency, ascites. Request made for diagnostic and therapeutic paracentesis.  EXAM: ULTRASOUND-GUIDED DIAGNOSTIC AND THERAPEUTIC PARACENTESIS  COMPARISON:  Prior paracentesis on 02/09/2015  MEDICATIONS: None.  COMPLICATIONS: None immediate  TECHNIQUE: Informed written consent was obtained from the  patient after a discussion of the risks, benefits and alternatives to treatment. A timeout was performed prior to the initiation of the procedure.  Initial ultrasound scanning demonstrates a moderate-to-large amount of ascites within the right lower abdominal quadrant. The right lower abdomen was prepped and draped in the usual sterile fashion. 1% lidocaine was used for local anesthesia. Under direct ultrasound guidance, a 19 gauge, 10-cm, Yueh catheter was introduced. An  ultrasound image was saved for documentation purposed. The paracentesis was performed. The catheter was removed and a dressing was applied. The patient tolerated the procedure well without immediate post procedural complication.  FINDINGS: A total of approximately 3.1 liters of dark, bloody fluid was removed. Samples were sent to the laboratory as requested by the clinical team.  IMPRESSION: Successful ultrasound-guided diagnostic and therapeutic paracentesis yielding 3.1 liters of peritoneal fluid. Critical care medicine notified of the above findings. The patient was given IV albumin during the procedure.  Read by: Jeananne Rama, PA-C   Electronically Signed   By: Irish Lack M.D.   On: 02/16/2015 16:56     Medications:   . sodium chloride 10 mL/hr (02/16/15 2046)  . TPN (CLINIMIX) Adult without lytes 80 mL/hr at 02/16/15 1800   And  . fat emulsion 240 mL (02/16/15 1800)  . TPN (CLINIMIX) Adult without lytes     And  . fat emulsion    . phenylephrine (NEO-SYNEPHRINE) Adult infusion 15 mcg/min (02/17/15 1132)   . sodium chloride   Intravenous Once  . anidulafungin  100 mg Intravenous Q24H  . antiseptic oral rinse  7 mL Mouth Rinse q12n4p  . chlorhexidine  15 mL Mouth Rinse BID  . darbepoetin (ARANESP) injection - NON-DIALYSIS  200 mcg Subcutaneous Q Wed-1800  . insulin aspart  0-20 Units Subcutaneous 6 times per day  . insulin glargine  35 Units Subcutaneous QHS  . ipratropium  0.5 mg Nebulization Q6H  . levalbuterol   0.63 mg Nebulization Q6H  . nystatin   Topical TID  . pantoprazole (PROTONIX) IV  40 mg Intravenous Q24H  . piperacillin-tazobactam (ZOSYN)  IV  3.375 g Intravenous 3 times per day  . sodium chloride  10-40 mL Intracatheter Q12H  . vancomycin  1,000 mg Intravenous Once   acetaminophen, fentaNYL (SUBLIMAZE) injection, iohexol, iohexol, ipratropium, levalbuterol, [DISCONTINUED] ondansetron **OR** ondansetron (ZOFRAN) IV, sodium chloride  Assessment/ Plan:   AKI prolonged ATN CRRT stopped 12/20 some urine output   Electrolytes stabe  Anemia stable  Acid base controlled   LOS: 29 Larry [name] @TODAY @1 :32 PM

## 2015-02-17 NOTE — Progress Notes (Signed)
February 17, 2015 Marcelle Smiling, RN, BSN, CCM: No discharge needs at time chart review.  Will follow for changes and needs. Update: iv neo drip, oliurgic, resting for crrt,Iv tpn and Iv heparin drip, CVP 12 mmHg. Bun 95 creat. 3.82

## 2015-02-17 NOTE — Care Management Note (Signed)
Case Management Note  Patient Details  Name: Larry [name] MRN: [id number] Date of Birth: 05-27-1946  Subjective/Objective:                 Oliguric, iv neo drip   Action/Plan: following   Expected Discharge Date:   (unknown)               Expected Discharge Plan:  Home/Self Care  In-House Referral:     Discharge planning Services  CM Consult  Post Acute Care Choice:    Choice offered to:     DME Arranged:    DME Agency:     HH Arranged:    HH Agency:     Status of Service:  In process, will continue to follow  Medicare Important Message Given:  Yes-third notification given Date Medicare IM Given:    Medicare IM give by:    Date Additional Medicare IM Given:    Additional Medicare Important Message give by:     If discussed at Long Length of Stay Meetings, dates discussed:  [id number]  Additional Comments:  Golda Acre, RN 02/17/2015, 10:50 AM

## 2015-02-17 NOTE — Progress Notes (Signed)
[place] NOTE   Pharmacy Consult for TPN Indication: Prolonged ileus  No Known Allergies  Patient Measurements: Height: 5\' 6"  (167.6 cm) Weight: 254 lb 10.1 oz (115.5 kg) IBW/kg (Calculated) : 63.8 Adjusted Body Weight: 78 kg, recalculated 9/1 due to weight changes to be 68 kg  Usual Weight:   Vital Signs: Temp: 98.2 F (36.8 C) (09/01 0907) Temp Source: Oral (09/01 0907) BP: 140/55 mmHg (09/01 0907) Pulse Rate: 101 (09/01 0907) Intake/Output from previous day: 08/31 0701 - 09/01 0700 In: 3397.2 [I.V.:550.1; NG/GT:210; IV Piggyback:330; TPN:2272.1] Out: 2046 [Urine:1245; Emesis/NG output:800; Stool:1] Intake/Output from this shift: Total I/O In: 284.9 [I.V.:44.9; Blood:30; NG/GT:30; TPN:180] Out: 170 [Urine:170]  Labs:  Recent Labs  02/15/15 0430  02/15/15 0946 02/16/15 0445 02/17/15 0500  WBC 15.5*  --   --  10.7* 12.1*  HGB 7.0*  < > 8.5* 11.0* 6.5*  HCT 22.6*  < > 25.0* 34.1* 20.5*  PLT 114*  --   --  104* 185  APTT 26  --   --  33 34  < > = values in this interval not displayed.   Recent Labs  02/15/15 0430  02/16/15 0445 02/16/15 1715 02/17/15 0500  NA 136  < > 135 137 134*  K 4.5  < > 4.9 4.8 4.5  CL 93*  < > 93* 94* 93*  CO2 36*  < > 33* 34* 30  GLUCOSE 184*  < > 138* 80 103*  BUN 41*  < > 71* 87* 95*  CREATININE 1.90*  < > 3.26* 3.60* 3.82*  CALCIUM 7.3*  < > 7.9* 8.1* 7.7*  MG 2.0  --  2.3  --  2.2  PHOS 2.5  2.5  < > 4.9*  5.0* 6.2* 6.3*  PROT  --   --   --   --  5.7*  ALBUMIN 1.7*  < > 1.7* 2.1* 1.8*  AST  --   --   --   --  47*  ALT  --   --   --   --  26  ALKPHOS  --   --   --   --  136*  BILITOT  --   --   --   --  1.7*  < > = values in this interval not displayed. Estimated Creatinine Clearance: 22.1 mL/min (by C-G formula based on Cr of 3.82).    Recent Labs  02/17/15 0019 02/17/15 0429 02/17/15 0741  GLUCAP 134* 104* 107*    Insulin Requirements:  6 units of Novolog SSI + Lantus 35 units + 40 units  regular insulin provided by TPN  Current Nutrition: NPO, unable to tolerate TF  IVF: NS KVO  Central access: PICC  TPN start date:  8/22  ASSESSMENT                                                                                                          HPI: 69 YOM presents with worsening abdominal pain on 8/3.  Recent cholecystectomy on 01/04/2015 with drain removed 2 weeks  prior to admission. Found to have GB fossa fluid collection (infected hematoma) and abscess. IR placed GB fossa drain.  Pharmacy asked to start TPN 8/22 for prolonged ileus and interment vomiting prohibiting use of enteral nutrition  Significant events:  8/3 septic shock from infected GB fossa 8/22 orders to start TPN 8/23 Start CRRT 8/25 intubated 8/29 extubated 8/30 CRRT stopped  Today, 02/17/2015:    Glucose - controlled now on Lantus qhs + SSI + insulin in TPN. Steroids tapered off yesterday.  Patient is on Lantus 40 units daily PTA.  Electrolytes:  Na 134  K+ WNL  Mg WNL  Phos elevated and increasing off of CRRT. Electrolytes removed from TPN.  Protein supplementation at ~1.4 g/kg/day (adjusted body weight)  CorrCa 9.46. CaPhos product 59.6 - Electrolytes removed from TPN.  Renal - oliguric AKI. CRRT 8/23-8/30.   24h I/O = +1.35L (positive due to TPN now off CRRT)  SCr 3.82, increasing, CRRT stopped 8/30 AM, hoping for renal recovery  Urine output documented as 0.4 mL/kg/hr yesterday, continues to make urine today.  LFTs - AST/ALT, Alk Phos ok, tbili 1.7  TGs - 155 (8/23), 175 (8/29)  Prealbumin - 5.3 (8/22), 10 (8/29)  NUTRITIONAL GOALS                                                                                             Updated RD recs 8/31 (now that off CRRT): 1950-2150 kcals, protein 95-105 g (fluid 2L/day) Clinimix 5/15 at a goal rate of 50ml/hr + 20% fat emulsion at 70ml/hr to provide: 96g/day protein, 1843Kcal/day.  PLAN                                                                                                              At 1800 today:  Continue Clinimix 5/15 (WITHOUT lytes) at 80 ml/hr.  Patient is off of CRRT, watching for recovery.  SCr and BUN increasing.  Protein supplementation at ~1.4 g/kg/day of adjusted body weight.  Concerned that reducing protein supplementation further would not benefit this acutely ill patient.  BUN may rise regardless of exogenous protein supplementation in this catabolic patient; however, if BUN continues to increase, may consider reducing protein provided by TPN.   Will continue to add regular insulin in TPN so that patient receives 40 units of regular insulin over 24 hour infusion.  Would prefer to add insulin to TPN rather than increase Lantus dose in case TPN is abruptly stopped.  CCM tapered off stress dose steroids yesterday.  Continue 20% lipid emulsion at 10 ml/hr.  Held lipids for first week of ICU admission.  Resumed 8/30.   TPN to contain standard multivitamins and  trace elements.  Continue to provide all trace elements for now.  Will need to consider removing chromium and selenium if SCr does not show improvement over next several days and remains on TPN.  Daily renal function panel, Mg and Phos ordered per MD   CBG monitoring and SSI q4h  F/u daily.  Hopefully, patient can be switched to enteral nutrition soon.  Patient is having BMs.  Hershal Coria, PharmD, BCPS Pager: [phone] 02/17/2015 10:29 AM

## 2015-02-17 NOTE — Progress Notes (Signed)
Spoke with wife and gave her update about past several days.  Larry [name]. [name], MD, FACS General, Bariatric, & Minimally Invasive Surgery [facility], [place]

## 2015-02-18 LAB — TYPE AND SCREEN
ABO/RH(D): O NEG
Antibody Screen: NEGATIVE
Unit division: 0

## 2015-02-18 LAB — RENAL FUNCTION PANEL
ALBUMIN: 1.7 g/dL — AB (ref 3.5–5.0)
Albumin: 1.8 g/dL — ABNORMAL LOW (ref 3.5–5.0)
Anion gap: 12 (ref 5–15)
Anion gap: 9 (ref 5–15)
BUN: 97 mg/dL — AB (ref 6–20)
BUN: 100 mg/dL — AB (ref 6–20)
CALCIUM: 7.8 mg/dL — AB (ref 8.9–10.3)
CHLORIDE: 97 mmol/L — AB (ref 101–111)
CHLORIDE: 97 mmol/L — AB (ref 101–111)
CO2: 29 mmol/L (ref 22–32)
CO2: 30 mmol/L (ref 22–32)
CREATININE: 4.04 mg/dL — AB (ref 0.61–1.24)
CREATININE: 4.02 mg/dL — AB (ref 0.61–1.24)
Calcium: 7.5 mg/dL — ABNORMAL LOW (ref 8.9–10.3)
GFR calc Af Amer: 16 mL/min — ABNORMAL LOW (ref >60)
GFR calc non Af Amer: 14 mL/min — ABNORMAL LOW (ref >60)
GFR, EST AFRICAN AMERICAN: 16 mL/min — AB (ref >60)
GFR, EST NON AFRICAN AMERICAN: 14 mL/min — AB (ref >60)
GLUCOSE: 88 mg/dL (ref 65–99)
Glucose, Bld: 208 mg/dL — ABNORMAL HIGH (ref 65–99)
PHOSPHORUS: 6.1 mg/dL — AB (ref 2.5–4.6)
Phosphorus: 6.1 mg/dL — ABNORMAL HIGH (ref 2.5–4.6)
Potassium: 3.9 mmol/L (ref 3.5–5.1)
Potassium: 3.6 mmol/L (ref 3.5–5.1)
SODIUM: 138 mmol/L (ref 135–145)
SODIUM: 136 mmol/L (ref 135–145)

## 2015-02-18 LAB — GLUCOSE, CAPILLARY
GLUCOSE-CAPILLARY: 92 mg/dL (ref 65–99)
GLUCOSE-CAPILLARY: 79 mg/dL (ref 65–99)
GLUCOSE-CAPILLARY: 78 mg/dL (ref 65–99)
GLUCOSE-CAPILLARY: 79 mg/dL (ref 65–99)
GLUCOSE-CAPILLARY: 82 mg/dL (ref 65–99)
Glucose-Capillary: 81 mg/dL (ref 65–99)
Glucose-Capillary: 98 mg/dL (ref 65–99)

## 2015-02-18 LAB — MAGNESIUM: Magnesium: 1.9 mg/dL (ref 1.7–2.4)

## 2015-02-18 LAB — AMYLASE, BODY FLUID: AMYLASE FL: 5 U/L

## 2015-02-18 LAB — CALCIUM, IONIZED: Calcium, Ionized, Serum: 4.4 mg/dL — ABNORMAL LOW (ref 4.5–5.6)

## 2015-02-18 LAB — APTT: APTT: 31 s (ref 24–37)

## 2015-02-18 MED ORDER — METOCLOPRAMIDE HCL 5 MG/ML IJ SOLN
5.0000 mg | Freq: Three times a day (TID) | INTRAMUSCULAR | Status: DC
  Administered 2015-02-18 – 2015-02-20 (×6): 5 mg via INTRAVENOUS
  Filled 2015-02-18 (×7): qty 2

## 2015-02-18 MED ORDER — FAT EMULSION 20 % IV EMUL
240.0000 mL | INTRAVENOUS | Status: AC
  Administered 2015-02-18: 240 mL via INTRAVENOUS
  Filled 2015-02-18: qty 250

## 2015-02-18 MED ORDER — TRACE MINERALS CR-CU-MN-SE-ZN 10-1000-500-60 MCG/ML IV SOLN
INTRAVENOUS | Status: AC
  Administered 2015-02-18: 17:00:00 via INTRAVENOUS
  Filled 2015-02-18: qty 1920

## 2015-02-18 MED ORDER — INSULIN GLARGINE 100 UNIT/ML ~~LOC~~ SOLN
30.0000 [IU] | Freq: Every day | SUBCUTANEOUS | Status: DC
  Filled 2015-02-18: qty 0.3

## 2015-02-18 NOTE — Progress Notes (Signed)
[place] NOTE   Pharmacy Consult for TPN Indication: Prolonged ileus  No Known Allergies  Patient Measurements: Height: 5\' 6"  (167.6 cm) Weight: 251 lb 1.7 oz (113.9 kg) IBW/kg (Calculated) : 63.8 Adjusted Body Weight: 78 kg, recalculated 9/1 due to weight changes to be 68 kg  Usual Weight:   Vital Signs: Temp: 98.1 F (36.7 C) (09/01 2258) Temp Source: Oral (09/01 2258) BP: 109/48 mmHg (09/02 0700) Pulse Rate: 104 (09/02 0700) Intake/Output from previous day: 09/01 0701 - 09/02 0700 In: 3468.4 [I.V.:415.9; Blood:360; NG/GT:30; IV Piggyback:480; TPN:2182.5] Out: 2080 [Urine:1465; Emesis/NG output:600; Drains:15] Intake/Output from this shift:    Labs:  Recent Labs  02/16/15 0445 02/17/15 0500 02/17/15 1800 02/18/15 0453  WBC 10.7* 12.1* 10.5  --   HGB 11.0* 6.5* 7.4*  --   HCT 34.1* 20.5* 22.7*  --   PLT 104* 185 189  --   APTT 33 34  --  31     Recent Labs  02/16/15 0445  02/17/15 0500 02/17/15 1615 02/18/15 0453  NA 135  < > 134* 136 138  K 4.9  < > 4.5 4.3 3.9  CL 93*  < > 93* 96* 97*  CO2 33*  < > 30 30 29   GLUCOSE 138*  < > 103* 129* 88  BUN 71*  < > 95* 104* 97*  CREATININE 3.26*  < > 3.82* 3.93* 4.04*  CALCIUM 7.9*  < > 7.7* 7.7* 7.8*  MG 2.3  --  2.2  --  1.9  PHOS 4.9*  5.0*  < > 6.3* 6.4* 6.1*  PROT  --   --  5.7*  --   --   ALBUMIN 1.7*  < > 1.8* 1.7* 1.8*  AST  --   --  47*  --   --   ALT  --   --  26  --   --   ALKPHOS  --   --  136*  --   --   BILITOT  --   --  1.7*  --   --   < > = values in this interval not displayed. Estimated Creatinine Clearance: 20.7 mL/min (by C-G formula based on Cr of 4.04).    Recent Labs  02/17/15 1946 02/17/15 2256 02/18/15 0442  GLUCAP 124* 92 81    Insulin Requirements:  6 units of Novolog SSI + Lantus 35 units + 40 units regular insulin provided by TPN  Current Nutrition: NPO, unable to tolerate TF  IVF: NS KVO  Central access: PICC  TPN start date:   8/22  ASSESSMENT                                                                                                          HPI: 63 YOM presents with worsening abdominal pain on 8/3.  Recent cholecystectomy on 01/04/2015 with drain removed 2 weeks prior to admission. Found to have GB fossa fluid collection (infected hematoma) and abscess. IR placed GB fossa drain.  Pharmacy asked to start TPN 8/22 for prolonged ileus and  interment vomiting prohibiting use of enteral nutrition.    Significant events:  8/3 septic shock from infected GB fossa 8/22 orders to start TPN 8/23 Start CRRT 8/25 intubated 8/29 extubated 8/30 CRRT stopped  Today, 02/18/2015:    Glucose - controlled now on Lantus qhs + SSI + insulin in TPN. Steroids tapered off.  Patient is on Lantus 40 units daily PTA.  Electrolytes:  Na WNL  K+ WNL  Mg WNL but trending down  Phos elevated and increased off of CRRT.  Electrolytes removed from TPN.  Protein supplementation at ~1.4 g/kg/day (adjusted body weight)  CorrCa 9.5. CaPhos product 58.3 - Electrolytes removed from TPN.  Renal - oliguric AKI. CRRT 8/23-8/30.   24h I/O = +1.35L (positive due to TPN now off CRRT)  SCr 4.04, increasing, CRRT stopped 8/30 AM, hoping for renal recovery  Urine output documented as 0.6 mL/kg/hr yesterday, continues to make urine today.  LFTs - AST/ALT, Alk Phos ok, tbili 1.7  TGs - 155 (8/23), 175 (8/29)  Prealbumin - 5.3 (8/22), 10 (8/29)  NUTRITIONAL GOALS                                                                                             Updated RD recs 8/31 (now that off CRRT): 1950-2150 kcals, protein 95-105 g (fluid 2L/day) Clinimix 5/15 at a goal rate of 21ml/hr + 20% fat emulsion at 29ml/hr to provide: 96g/day protein, 1843Kcal/day.  PLAN                                                                                                             At 1800 today:  Continue Clinimix 5/15 (WITHOUT lytes) at 80 ml/hr.   Patient is off of CRRT, watching for recovery.  SCr and BUN increasing.  Protein supplementation at ~1.4 g/kg/day of adjusted body weight.  Concerned that reducing protein supplementation further would not benefit this acutely ill patient.  BUN may rise regardless of exogenous protein supplementation in this catabolic patient; however, if BUN continues to increase, may consider reducing protein provided by TPN.   Will continue to add regular insulin in TPN so that patient receives 40 units of regular insulin over 24 hour infusion.  Would prefer to add insulin to TPN rather than increase Lantus dose in case TPN is abruptly stopped. Ttapered off stress dose steroids 9/1.  Continue 20% lipid emulsion at 10 ml/hr.  Held lipids for first week of ICU admission.  Resumed 8/30.   TPN to contain standard multivitamins and trace elements.  Continue to provide all trace elements for now.  Will need to consider removing chromium and selenium if SCr does not show improvement over  next several days and remains on TPN.  Daily renal function panel, Mg and Phos ordered per MD   CBG monitoring and SSI q4h  F/u daily.   Noted plans to start NG tube clamping trials 9/3 if NG output stays 600 cc or less / 24 hours.  Reglan restarted.  Having multiple BMs.

## 2015-02-18 NOTE — Progress Notes (Signed)
   02/18/15 1500  Clinical Encounter Type  Visited With Patient;Family  Visit Type Follow-up  Consult/Referral To Chaplain  Spiritual Encounters  Spiritual Needs Other (Comment);Emotional Veterinary surgeon )  Stress Factors  Patient Stress Factors None identified  Family Stress Factors None identified   Chaplain followed up with the patient and his wife after two previous contacts with the patient and his family.  The patient's wife was at the bedside at this time and the nurse was doing a medical procedure. The patient and his wife requested that the Chaplain return at a later time once the nurse had finished what she was doing. The Chaplain dropped off a prayer shaw and prayer for the patient and his wife.  Chaplain interventions included pastoral conversation and religious materials.  Chaplain will follow up with the patient at a later time.

## 2015-02-18 NOTE — Progress Notes (Signed)
CSW following to assist with d/c planning. PN reviewed. Pt is working with PT. SNF continues to be recommended. CSW will initiate SNF search closer to the time of d/c. CSW will continue to follow to assist with d/c planning.  Cori Razor LCSW [phone]

## 2015-02-18 NOTE — Progress Notes (Addendum)
Subjective: No major events. Pt denies pain. States he sat on bed pan several times yesterday. Does state that he "can't get my email straight". Got a unit of blood yesterday  Apparently got up and walked short distance with walker and PT yesterday!!!  Objective: Vital signs in last 24 hours: Temp:  [98.1 F (36.7 C)-98.5 F (36.9 C)] 98.1 F (36.7 C) (09/01 2258) Pulse Rate:  [90-112] 104 (09/02 0700) Resp:  [16-33] 32 (09/02 0700) BP: (74-140)/(35-78) 109/48 mmHg (09/02 0700) SpO2:  [93 %-100 %] 94 % (09/02 0700) Weight:  [113.9 kg (251 lb 1.7 oz)] 113.9 kg (251 lb 1.7 oz) (09/02 0500) Last BM Date: 02/17/15  Intake/Output from previous day: 09/01 0701 - 09/02 0700 In: 3468.4 [I.V.:415.9; Blood:360; NG/GT:30; IV Piggyback:480; TPN:2182.5] Out: 2080 [Urine:1465; Emesis/NG output:600; Drains:15] Intake/Output this shift:    Asleep, easily awakes. "16" "obama" Soft, obese, some mild right sided ttp; drain - old blood NG about 600cc/24hrs  Lab Results:   Recent Labs  02/17/15 0500 02/17/15 1800  WBC 12.1* 10.5  HGB 6.5* 7.4*  HCT 20.5* 22.7*  PLT 185 189   BMET  Recent Labs  02/17/15 1615 02/18/15 0453  NA 136 138  K 4.3 3.9  CL 96* 97*  CO2 30 29  GLUCOSE 129* 88  BUN 104* 97*  CREATININE 3.93* 4.04*  CALCIUM 7.7* 7.8*   PT/INR No results for input(s): LABPROT, INR in the last 72 hours. ABG No results for input(s): PHART, HCO3 in the last 72 hours.  Invalid input(s): PCO2, PO2  Studies/Results: Ct Abdomen Pelvis Wo Contrast  02/16/2015   CLINICAL DATA:  Six weeks post laparoscopic cholecystectomy complicated by a gallbladder fossa hematoma, followup, past surgical drain that was pulled, now having vomiting and abdominal pain, unable to keep oral intake down, past history hypertension, atrial fibrillation, coronary artery disease post MI  EXAM: CT ABDOMEN AND PELVIS WITHOUT CONTRAST  TECHNIQUE: Multidetector CT imaging of the abdomen and pelvis was  performed following the standard protocol without IV contrast. Sagittal and coronal MPR images reconstructed from axial data set. Patient drank a small amount of dilute oral contrast for exam.  COMPARISON:  02/07/2015  FINDINGS: Small RIGHT pleural effusion with bibasilar atelectasis.  Subcapsular fluid collection at lateral and posterior aspects of liver extending to dome, stable.  Pigtail drainage catheter identified within a persistent large fluid collection at the gallbladder fossa 11.7 x 9.0 x 8.5 cm previously 9.7 x 9.7 x 8.8 cm.  Additional subhepatic collection anteromedial to gallbladder collection containing air and fluid, decreased in size now measuring 5.0 x 5.0 x 4.4 cm, uncertain if communicating with the gallbladder fossa collection, previously measured 6.6 x 6.8 x 6.8 cm.  Multiple low-attenuation fluid collections without gas are seen throughout the abdomen including large amount of fluid at the gutters into the pelvis, interloop, perigastric, and anteriorly in the upper and mid abdomen, probably partially loculated at multiple sites.  Nasogastric tube extends to the pylorus.  Several foci of extraluminal gas are identified anterior to the gallbladder fossa collection, potentially related to the abscess collection and catheter drainage, without free intraperitoneal air at additional sites.  Spleen, pancreas, kidneys and adrenal glands unremarkable.  Scattered atherosclerotic calcifications.  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  Bladder decompressed by urinary bladder.  No mass, adenopathy, hernia or acute bone lesion.  IMPRESSION: Persistent large gas and fluid collection at the gallbladder fossa slightly larger than on previous exam despite pigtail drainage catheter.  Interval mild decrease in size of a collection in anteromedial to the gallbladder fossa collection, question communicating with drainage catheter.  Significant ascites, question partially loculated at sites  in the upper abdomen.  Several small foci of gas are seen anterior to the gallbladder fossa collection, question related to percutaneous drainage, without additional free intraperitoneal air identified.   Electronically Signed   By: Ulyses Southward M.D.   On: 02/16/2015 11:24   US Paracentesis  02/16/2015   INDICATION: Patient with recent history of sepsis, gallbladder fossa abscess drainage, renal insufficiency, ascites. Request made for diagnostic and therapeutic paracentesis.  EXAM: ULTRASOUND-GUIDED DIAGNOSTIC AND THERAPEUTIC PARACENTESIS  COMPARISON:  Prior paracentesis on 02/09/2015  MEDICATIONS: None.  COMPLICATIONS: None immediate  TECHNIQUE: Informed written consent was obtained from the patient after a discussion of the risks, benefits and alternatives to treatment. A timeout was performed prior to the initiation of the procedure.  Initial ultrasound scanning demonstrates a moderate-to-large amount of ascites within the right lower abdominal quadrant. The right lower abdomen was prepped and draped in the usual sterile fashion. 1% lidocaine was used for local anesthesia. Under direct ultrasound guidance, a 19 gauge, 10-cm, Yueh catheter was introduced. An ultrasound image was saved for documentation purposed. The paracentesis was performed. The catheter was removed and a dressing was applied. The patient tolerated the procedure well without immediate post procedural complication.  FINDINGS: A total of approximately 3.1 liters of dark, bloody fluid was removed. Samples were sent to the laboratory as requested by the clinical team.  IMPRESSION: Successful ultrasound-guided diagnostic and therapeutic paracentesis yielding 3.1 liters of peritoneal fluid. Critical care medicine notified of the above findings. The patient was given IV albumin during the procedure.  Read by: Jeananne Rama, PA-C   Electronically Signed   By: Irish Lack M.D.   On: 02/16/2015 16:56    Anti-infectives: Anti-infectives    Start      Dose/Rate Route Frequency Ordered Stop   02/17/15 1400  vancomycin (VANCOCIN) IVPB 1000 mg/200 mL premix     1,000 mg 200 mL/hr over 60 Minutes Intravenous  Once 02/17/15 1320 02/17/15 1502   02/16/15 2200  piperacillin-tazobactam (ZOSYN) IVPB 3.375 g     3.375 g 12.5 mL/hr over 240 Minutes Intravenous 3 times per day 02/16/15 1603     02/15/15 2200  meropenem (MERREM) 500 mg in sodium chloride 0.9 % 50 mL IVPB  Status:  Discontinued     500 mg 100 mL/hr over 30 Minutes Intravenous Every 12 hours 02/15/15 1415 02/16/15 1533   02/14/15 1200  vancomycin (VANCOCIN) IVPB 1000 mg/200 mL premix  Status:  Discontinued     1,000 mg 200 mL/hr over 60 Minutes Intravenous Every 24 hours 02/13/15 0920 02/16/15 0706   02/13/15 1000  vancomycin (VANCOCIN) 1,750 mg in sodium chloride 0.9 % 500 mL IVPB     1,750 mg 250 mL/hr over 120 Minutes Intravenous  Once 02/13/15 0836 02/13/15 1210   02/12/15 1630  meropenem (MERREM) 500 mg in sodium chloride 0.9 % 50 mL IVPB  Status:  Discontinued     500 mg 100 mL/hr over 30 Minutes Intravenous Every 6 hours 02/12/15 1106 02/15/15 1415   02/11/15 1800  anidulafungin (ERAXIS) 100 mg in sodium chloride 0.9 % 100 mL IVPB     100 mg over 90 Minutes Intravenous Every 24 hours 02/10/15 1744     02/10/15 1730  anidulafungin (ERAXIS) 200 mg in sodium chloride 0.9 % 200 mL IVPB     200  mg over 180 Minutes Intravenous  Once 02/10/15 1720 02/10/15 2230   02/10/15 0200  meropenem (MERREM) 500 mg in sodium chloride 0.9 % 50 mL IVPB  Status:  Discontinued     500 mg 100 mL/hr over 30 Minutes Intravenous Every 8 hours 02/09/15 1808 02/12/15 1106   02/10/15 0000  meropenem (MERREM) 500 mg in sodium chloride 0.9 % 50 mL IVPB  Status:  Discontinued     500 mg 100 mL/hr over 30 Minutes Intravenous 4 times per day 02/09/15 1655 02/09/15 1808   02/09/15 1800  meropenem (MERREM) 1 g in sodium chloride 0.9 % 100 mL IVPB     1 g 200 mL/hr over 30 Minutes Intravenous  Once  02/09/15 1655 02/09/15 1818   02/08/15 1800  piperacillin-tazobactam (ZOSYN) IVPB 3.375 g  Status:  Discontinued     3.375 g 100 mL/hr over 30 Minutes Intravenous 4 times per day 02/08/15 1403 02/09/15 1655   02/07/15 1400  piperacillin-tazobactam (ZOSYN) IVPB 2.25 g  Status:  Discontinued     2.25 g 100 mL/hr over 30 Minutes Intravenous 3 times per day 02/07/15 0750 02/08/15 1403   02/04/15 1200  piperacillin-tazobactam (ZOSYN) IVPB 2.25 g  Status:  Discontinued     2.25 g 100 mL/hr over 30 Minutes Intravenous 4 times per day 02/04/15 0859 02/04/15 0905   02/04/15 1200  piperacillin-tazobactam (ZOSYN) IVPB 3.375 g  Status:  Discontinued     3.375 g 12.5 mL/hr over 240 Minutes Intravenous Every 8 hours 02/04/15 0905 02/07/15 0750   02/02/15 2000  fluconazole (DIFLUCAN) tablet 100 mg     100 mg Oral Every 24 hours 02/02/15 1908 02/08/15 2041   02/02/15 1800  fluconazole (DIFLUCAN) tablet 100 mg  Status:  Discontinued     100 mg Oral Daily 02/02/15 1545 02/02/15 1901   01/31/15 1000  fluconazole (DIFLUCAN) tablet 100 mg  Status:  Discontinued     100 mg Oral Daily 01/31/15 0727 02/02/15 1313   01/29/15 1700  piperacillin-tazobactam (ZOSYN) IVPB 2.25 g  Status:  Discontinued     2.25 g 100 mL/hr over 30 Minutes Intravenous Every 8 hours 01/29/15 0845 02/04/15 0859   01/24/15 2200  vancomycin (VANCOCIN) IVPB 1000 mg/200 mL premix     1,000 mg 200 mL/hr over 60 Minutes Intravenous  Once 01/24/15 1349 01/24/15 2301   01/21/15 2000  vancomycin (VANCOCIN) 1,500 mg in sodium chloride 0.9 % 500 mL IVPB  Status:  Discontinued     1,500 mg 250 mL/hr over 120 Minutes Intravenous Every 24 hours 01/21/15 1048 01/23/15 1726   01/20/15 2000  vancomycin (VANCOCIN) 1,250 mg in sodium chloride 0.9 % 250 mL IVPB  Status:  Discontinued     1,250 mg 166.7 mL/hr over 90 Minutes Intravenous Every 24 hours 01/19/15 1945 01/20/15 0843   01/20/15 2000  vancomycin (VANCOCIN) 1,250 mg in sodium chloride 0.9 % 250  mL IVPB  Status:  Discontinued     1,250 mg 166.7 mL/hr over 90 Minutes Intravenous Every 24 hours 01/20/15 1519 01/21/15 1048   01/20/15 0000  piperacillin-tazobactam (ZOSYN) IVPB 3.375 g  Status:  Discontinued     3.375 g 12.5 mL/hr over 240 Minutes Intravenous Every 8 hours 01/19/15 1946 01/29/15 0845   01/19/15 1715  vancomycin (VANCOCIN) 2,500 mg in sodium chloride 0.9 % 500 mL IVPB     2,500 mg 250 mL/hr over 120 Minutes Intravenous  Once 01/19/15 1707 01/19/15 2159   01/19/15 1715  piperacillin-tazobactam (ZOSYN) IVPB 3.375  g     3.375 g 100 mL/hr over 30 Minutes Intravenous  Once 01/19/15 1707 01/19/15 1900      Assessment/Plan: S/p interval lap chole 7/18 complicated by postop hematoma/sepsis Readmitted 8/4 for sepsis, aki on ckd, acute on chronic anemia  ID -Repeat paracentesis cultures NTD. Agree with ID rec to stop vanc if cultures remain neg after another 24-48hrs. F/u ID recs  Renal - Bun/cr going up again, making good urine. Mentation maybe starting to go down some from uremia - defer to renal on whether or not pt will need addl dialysis  GI - having multiple BMs. But NG tube output is slowly getting better. Pulled tube back some yesterday. No sign of dilated loops/obstruction on CT. Cdiff neg. i would be ok with NG tube clamping trials starting Saturday as long as NG output stays around 600cc or less/24hrs.  restart reglan. Cont TPN for now  Wean Neo as tolerated  Heme - he has had intermittent episodes of dropping his hgb/hct. Therefore I would not restart systemic IV heparin for PAF. He also dropped his hgb on just subcu heparin. So right now i think SCDs only for DVT prophylaxis  Cont PT/OT/ out of bed to chair  [name]. [name], MD, FACS General, Bariatric, & Minimally Invasive Surgery [facility], [place]    LOS: 30 days    [name] 02/18/2015

## 2015-02-18 NOTE — Progress Notes (Signed)
Nutrition Follow-up  DOCUMENTATION CODES:   Severe malnutrition in context of acute illness/injury, Morbid obesity  INTERVENTION:  - Continue TPN per pharmacy, recommend beginning weaning as medically feasible - Will continue to monitor for ability for SLP to evaluation - Recommend trial of trickle feeds: Nepro @ 10 mL/hr to advance to goal rate of 45 mL/hr with 30 mL Prostat once/day. Goal rate TF regimen would provide 2044 kcal, 102 grams protein, and 785 mL free water. - RD will continue to monitor for needs  NUTRITION DIAGNOSIS:   Inadequate oral intake related to inability to eat as evidenced by NPO status. -ongoing  GOAL:   Patient will meet greater than or equal to 90% of their needs -met with TPN  MONITOR:   Labs, Weight trends, Skin, I & O's, Other (Comment) (TPN regimen)  ASSESSMENT:   69 y/o admitted in 10/05/2012 with Sepsis, hypotension, acute renal failure and acute cholecystitis. He had been place on Eliquis for his AF by the [place]. He had an MI 2 years ago and did not follow up with his cardiologist. He was acutely ill and underwent a percutaneous drain placement on 10/06/14 by IR. He went home on 10/11/14. He was readmitted on 68/14/16 and underwent laparoscopic cholecystectomy with IOC. He was left with a drain in place and the site had "SNOW' placed in the GB fossa for hemostasis after the procedure. Drain was removed last week but there was some brusing at the site. He is transferred from [facility] with complaints of increased abdominal pain.  9/2 Pt continues with NGT and surgery note this AM indicates possibility to clamp NGT starting 9/3. Reglan order has been re-started. SLP re-attempted to evaluate pt yesterday evening but unable; RN this AM states that no further indication for SLP to follow-up at this time so will monitor for ability once NGT has been clamped.  Surgery note also indicates pt walked short distance with PT yesterday and defers to renal team  concerning if additional dialysis will be needed.  Pt continues with TPN: Clinimix 5/15 @ 80 mL/hr with 20% lipids @ 10 mL/hr which is providing 96 grams protein, 1843 kcal (meeting 100% protein and 94.5% minimum kcal needs).  Per pharmacy note this AM: At 1800 today:  Continue Clinimix 5/15 (WITHOUT lytes) at 80 ml/hr. Patient is off of CRRT, watching for recovery. SCr and BUN increasing. Protein supplementation at ~1.4 g/kg/day of adjusted body weight. Concerned that reducing protein supplementation further would not benefit this acutely ill patient. BUN may rise regardless of exogenous protein supplementation in this catabolic patient; however, if BUN continues to increase, may consider reducing protein provided by TPN.  Will continue to add regular insulin in TPN so that patient receives 40 units of regular insulin over 24 hour infusion. Would prefer to add insulin to TPN rather than increase Lantus dose in case TPN is abruptly stopped. Ttapered off stress dose steroids 9/1.  Continue 20% lipid emulsion at 10 ml/hr. Held lipids for first week of ICU admission. Resumed 8/30.   TPN to contain standard multivitamins and trace elements. Continue to provide all trace elements for now. Will need to consider removing chromium and selenium if SCr does not show improvement over next several days and remains on TPN.  Daily renal function panel, Mg and Phos ordered per MD   Pt had BM yesterday; recommend when NGT is able to be clamped due to decreased output, initiate trickle feeds. If pt tolerates, it would be preferential to feed enterally  rather than parenterally and to wean from TPN at that time.  Recommending Nepro due to consistently elevated Phos and need to limit fluids.  Meeting needs with TPN. Medications reviewed. Labs reviewed; CBGs: 79-130 mg/dL, Cl: 97 mmol/L, BUN/creatinine elevated, Ca: 7.8 mg/dL, Phos: 6.1 mg/dL which is trending down, GFR: 14.    9/1 - Pt was intubated  8/26-8/29 - Off CRRT - Speech unable to perform eval d/t pt's NGT. Pt on NGT d/t ileus. Pt is having BMs. - Pt to continue TPN. TPN infusing @ 80 ml/hr + 20% lipid infusion @ 10 ml/hr. Provides 96g protein and 1843 kcal.   Diet Order:  Diet NPO time specified Except for: Ice Chips TPN (CLINIMIX) Adult without lytes TPN (CLINIMIX) Adult without lytes  Skin:  Wound (see comment) (abdominal incision)  Last BM:  9/1  Height:   Ht Readings from Last 1 Encounters:  01/20/15 $RemoveB'5\' 6"'MToOwTwc$  (1.676 m)    Weight:   Wt Readings from Last 1 Encounters:  02/18/15 251 lb 1.7 oz (113.9 kg)    Ideal Body Weight:  64.54 kg (kg)  BMI:  Body mass index is 40.55 kg/(m^2).  Estimated Nutritional Needs:   Kcal:  1950-2150  Protein:  95-105g  Fluid:  2L/day  EDUCATION NEEDS:   No education needs identified at this time     Jarome Matin, RD, LDN Inpatient Clinical Dietitian Pager # [phone] After hours/weekend pager # [phone]

## 2015-02-18 NOTE — Progress Notes (Signed)
PULMONARY / CRITICAL CARE MEDICINE   Name: Larry [name] MRN: [id number] DOB: [birth year]    ADMISSION DATE:  01/19/2015 CONSULTATION DATE:  8/4  REFERRING MD :  [name]  CHIEF COMPLAINT:  Septic shock   INITIAL PRESENTATION:  69 year old male w/ CAF on Elliquis who recently underwent lap chole on 7/14. Was discharged home w/drain. Drain removed about a week before presentation to ER. Admitted directly from [facility] on 8/3 w/ septic shock which was likely due to infected hematoma in the GB fossa. PCCM asked to see after new perc drain placed on 8/4, when he remained hypotensive.  Required CRRT 8/23 for AKI  Intubated 8/26 for desaturations, significant agitation, delirium  STUDIES:  CT abdomen/Pelvis W/ 7/28 - Gas & fluid at cholecystectomy site w/o discrete abscess. Min ascites. Small right pleural eff. CT abdomen/pelvis w/o 8/5 - Gas between liver & stomach. No additional free air. Increased flow void & high density layering c/w increasing hemorrhage as well as increased right pleural eff. CT abdomen/pelvis 8/18 >> 9.8 cm hematoma with gas in the gallbladder fossa, indwelling pigtail drainage catheter, 12.6 cm hematoma along the posterior R liver, mod ascites, mild wall thickening involving the R colon CT abdomen/pelvis 8/22 >> stable perihepatic hematoma, stable gallbladder fossa collection despite adequate drainage cath placement, significant free fluid within the abdomen/pelvis slightly increased from prior exam 8/31 CT abd >>persistent large fluid collection at the gallbladder fossa 11.7 x 9.0 x 8.5 cm previously 9.7 x 9.7 x 8.8 cm. Additional subhepatic collection anteromedial to gallbladder collection containing air and fluid, decreased in size now measuring 5.0 x 5.0 x 4.4 cm, Significant ascites  SIGNIFICANT EVENTS: Admitted 8/3 w/ abd pain and hypotension  8/04  hypotensive over-night. Went for [facility] drain of GB fossa. Found what looked like old infected hematoma. PCCM  asked to see post-op for shock 8/05  off pressors. WOB worse.  8/06  WOB & wheezing slightly worse. Progressing abdominal pain 8/07  Hgb declining w/ increasing intraperitoneal hemorrhage 8/07  Transfused 2u PRBCs 8/09  work of breathing still significant w/ marked upper airway component  8/10  breathing better. 1.6 liters negative. Slight bump in scr. Vanc stopped. Getting OOB for first time 8/11  eating regular diet. Up in chair. Feeling better.  Respiratory status improved.  PCCM s/o 8/14  Renal consulted for rising sr cr (to 3.60), baseline sr cr 1.1-1.5 (prior IV contrast 7/28).   8/14  Concern for yeast / bacterial UTI  8/15  ID consulted > abx narrowed to zosyn only, fluconazole for yeast  8/16  UOP increased on IV lasix 8/18  NPO, sr cr improving, CT abd as above  8/18  GB drain upsized per IR to 16 Fr 8/19  Abd pain, eating well.  Diarrhea after ensure.  To PO lasix.  Discussed gastrostomy tube with IR 8/22  Sr Cr rising, intermittent vomiting.  CT abd as above.  Increased confusion 8/23  HD cath inserted, CRRT initiated.  Gastrostomy tube held off due to CRRT & Hgb drop 8/24  WBC elevated, cultures repeated.   8/24  Paracentesis >> 150 ml "bloody bile" fluid removed, loculated and difficult to aspirate 8/25  Palliative care consulted, PCCM consulted.  Vomiting with NGT insertion.  8/26  Intubated early am(0200) for desaturations, significant agitation, delirium  8/30 CRRT stopped, bilious vomiting/secretions 8/31 Paracentesis >> 3.1 L 9/1 1 U PRBC  SUBJECTIVE:   Afebrile Denies pain Making more urine Remains critically ill, on  low dose neo gtt, off CRRT  VITAL SIGNS: Temp:  [98.1 F (36.7 C)-98.4 F (36.9 C)] 98.3 F (36.8 C) (09/02 0800) Pulse Rate:  [90-112] 104 (09/02 0700) Resp:  [16-33] 32 (09/02 0700) BP: (74-125)/(35-78) 109/48 mmHg (09/02 0700) SpO2:  [93 %-100 %] 94 % (09/02 0849) Weight:  [251 lb 1.7 oz (113.9 kg)] 251 lb 1.7 oz (113.9 kg) (09/02  0500)  HEMODYNAMICS: CVP:  [8 mmHg-13 mmHg] 13 mmHg   VENTILATOR SETTINGS:     INTAKE / OUTPUT:  Intake/Output Summary (Last 24 hours) at 02/18/15 0953 Last data filed at 02/18/15 0700  Gross per 24 hour  Intake 3183.59 ml  Output   1910 ml  Net 1273.59 ml    PHYSICAL EXAMINATION: General:  Acutely ill adult male , in bed Neuro:  interactive, some confusion,anxious affect HEENT:  HD cath site clean, no discharge Cardiovascular:  s1s2 rrr, no m/r/g Lungs:  Clear, BL air entry + Abdomen:  RUQ perc drain in place with scant drainage. Hypoactive BS. Non- tender Musculoskeletal:  No joint effusions or deformities. Skin:  gen edema  LABS:  CBC  Recent Labs Lab 02/16/15 0445 02/17/15 0500 02/17/15 1800  WBC 10.7* 12.1* 10.5  HGB 11.0* 6.5* 7.4*  HCT 34.1* 20.5* 22.7*  PLT 104* 185 189   Coag's  Recent Labs Lab 02/16/15 0445 02/17/15 0500 02/18/15 0453  APTT 33 34 31   BMET  Recent Labs Lab 02/17/15 0500 02/17/15 1615 02/18/15 0453  NA 134* 136 138  K 4.5 4.3 3.9  CL 93* 96* 97*  CO2 BUN 95* 104* 97*  CREATININE 3.82* 3.93* 4.04*  GLUCOSE 103* 129* 88   Electrolytes  Recent Labs Lab 02/16/15 0445  02/17/15 0500 02/17/15 1615 02/18/15 0453  CALCIUM 7.9*  < > 7.7* 7.7* 7.8*  MG 2.3  --  2.2  --  1.9  PHOS 4.9*  5.0*  < > 6.3* 6.4* 6.1*  < > = values in this interval not displayed. Sepsis Markers No results for input(s): LATICACIDVEN, PROCALCITON, O2SATVEN in the last 168 hours.   ABG  Recent Labs Lab 02/12/15 1205 02/13/15 1300 02/14/15 0842  PHART 7.428 7.384 7.420  PCO2ART 43.0 56.4* 52.3*  PO2ART 77.8* 72.2* 73.7*   Liver Enzymes  Recent Labs Lab 02/13/15 0415  02/14/15 0530  02/17/15 0500 02/17/15 1615 02/18/15 0453  AST 36  --  55*  --  47*  --   --   ALT 27  --  33  --  26  --   --   ALKPHOS 107  --  100  --  136*  --   --   BILITOT 1.6*  --  2.0*  --  1.7*  --   --   ALBUMIN 1.9*  2.0*  < > 1.9*  1.9*   < > 1.8* 1.7* 1.8*  < > = values in this interval not displayed.   Cardiac Enzymes No results for input(s): TROPONINI, PROBNP in the last 168 hours.   Glucose  Recent Labs Lab 02/17/15 1542 02/17/15 1946 02/17/15 2256 02/18/15 0442 02/18/15 0815 02/18/15 0948  GLUCAP 110* 124* 92 81 79 78    Imaging No results found.  ASSESSMENT / PLAN:  PULMONARY A: ETT 8/26 >> 8/29 Acute Hypoxic Respiratory Failure - in the setting of significant Agitation, renal fx, delirium  - resolved Chronically elevated right HD Right pleural effusion - progessing on Abd CT 8/6 P:   Xopenex neb q6hr  PRN IS q 1h when awake   CARDIOVASCULAR CVL Right IJ 8/4 >> 8/22 RUE PICC 8/22 >>  A:  Severe sepsis/septic shock - resolved Hypotension - 8/26, started on neo post intubation, smouldering sepsis Hx PAF - CHADVASC score 4; currently NSR  Prior NSTEMI  Pressor needed 8/27, prece dex related>? Adrenal insuff P:  Taper Neo gtt  for MAP > 60  Dc stress steroids dc vaso  RENAL A:   AKI- in setting of septic shock, prior hypotension/ATN Non oliguric now  P:   Off Cvvhd,  Making more urine -BUN plateau , follow, will have pos balance with TNA Hope for renal recovery here  GASTROINTESTINAL A:   Infected hematoma s/p cholecystectomy - s/p CT guided Perc drain with subsequent upsize to 16 Fr, Paracentesis 8/31 c/w peritonitis GERD - worse 8/5 w/ associated UAW wheeze Intra-abdominal hemorrhage / hematoma  Nausea / Vomiting - concern for ileus (8/25) Protein Calorie Malnutrition   P:   TNA per pharmacy  Protonix QD NPO Ct NG to LIS - clamp on 9/3 & advance PO if tolerates reglan added  HEMATOLOGIC A:   Anemia - in setting of intra-abdominal hemorrhage (8/4).  S/p 2 u PRBC on 8/4,2u 8/7  , 1 U 9/1 P:  Trend CBC  SCDs Transfuse for Hbg < 7  INFECTIOUS A:   Severe sepsis/septic shock - presume source is infected hematoma from recent Cholecystectomy. Shock resolved.  S/p Perc GB  fossa site drain 8/4 - minimal serous output High Risk for Fungemia P:   ID following, appreciate input   Abscess 8/4>>>neg BCX2 8/4>>>neg  Peritoneal fluid GS 8/24 >> abundant WBC, no organisms seen Peritoneal fluid culture 8/24 >>  BCx2 8/25 >> ng Urine 8/14 >> yeast  Ascitic fluid 8/31 >ng c diff neg 9/1  Meropenem 8/24 >>  Fluconazole 8/15 >> 8/23 anigulofungin 8/26>>> vanc 8/28>>>8/31    ENDOCRINE A: DM - CBG's better  controlled AI P:   SSI Q4 decrease Lantus 30 units  Added insulin to TNA   NEUROLOGIC A:  Delirium  Critical Illness Deconditioning  P:   RASS goal: 0 Delirium prevention measures: minimize sedating medications, promote sleep / wake cycle  Fentanyl prn PT following -oob to chair, SNF eventual   FAMILY  - Updates:  D-in law 8/30  - Inter-disciplinary family meet or Palliative Care meeting due by:  Ongoing, family wants to continue full aggressive measures.  Hopeful that patient will be able to recover.     Summary - recovering MODS, extubated, on break from CRRT -now non oliguric waiting for renal recovery, smouldering sepsis from intra abd abscess & peritonitis, Come off TNA once bowels better  The patient is critically ill with multiple organ systems failure and requires high complexity decision making for assessment and support, frequent evaluation and titration of therapies, application of advanced monitoring technologies and extensive interpretation of multiple databases. Critical Care Time devoted to patient care services described in this note independent of APP time is 31 minutes.    Cyril Mourning MD. Tonny Bollman. [facility] Pager [phone] If no response call [phone]

## 2015-02-18 NOTE — Progress Notes (Signed)
[facility] ROUNDING NOTE   Subjective:   Interval History: diuresing well  Weight continues to decrease  Objective:  Vital signs in last 24 hours:  Temp:  [98.1 F (36.7 C)-98.4 F (36.9 C)] 98.3 F (36.8 C) (09/02 0800) Pulse Rate:  [90-112] 104 (09/02 0700) Resp:  [16-33] 32 (09/02 0700) BP: (74-125)/(35-65) 109/48 mmHg (09/02 0700) SpO2:  [93 %-100 %] 94 % (09/02 0849) Weight:  [113.9 kg (251 lb 1.7 oz)] 113.9 kg (251 lb 1.7 oz) (09/02 0500)  Weight change: -1.6 kg (-3 lb 8.4 oz) Filed Weights   02/16/15 0441 02/17/15 0500 02/18/15 0500  Weight: 116.5 kg (256 lb 13.4 oz) 115.5 kg (254 lb 10.1 oz) 113.9 kg (251 lb 1.7 oz)    Intake/Output: I/O last 3 completed shifts: In: 4941.6 [I.V.:693.1; Blood:360; NG/GT:30; IV Piggyback:580] Out: 3185 [Urine:2270; Emesis/NG output:900; Drains:15]   Intake/Output this shift:     CVS- RRR RS- CTA ABD- BS present soft non-distended EXT- no edema   Basic Metabolic Panel:  Recent Labs Lab 02/14/15 0530  02/15/15 0430  02/16/15 0445 02/16/15 1715 02/17/15 0500 02/17/15 1615 02/18/15 0453  NA 135  137  < > 136  < > 135 137 134* 136 138  K 4.3  4.3  < > 4.5  < > 4.9 4.8 4.5 4.3 3.9  CL 94*  93*  < > 93*  < > 93* 94* 93* 96* 97*  CO2 36*  36*  --  36*  < > 33* 34* 30 30 29   GLUCOSE 150*  146*  < > 184*  < > 138* 80 103* 129* 88  BUN 38*  38*  < > 41*  < > 71* 87* 95* 104* 97*  CREATININE 2.06*  2.06*  < > 1.90*  < > 3.26* 3.60* 3.82* 3.93* 4.04*  CALCIUM 8.1*  8.2*  --  7.3*  < > 7.9* 8.1* 7.7* 7.7* 7.8*  MG 2.0  --  2.0  --  2.3  --  2.2  --  1.9  PHOS 3.1  3.1  --  2.5  2.5  < > 4.9*  5.0* 6.2* 6.3* 6.4* 6.1*  < > = values in this interval not displayed.  Liver Function Tests:  Recent Labs Lab 02/13/15 0415  02/14/15 0530  02/16/15 0445 02/16/15 1715 02/17/15 0500 02/17/15 1615 02/18/15 0453  AST 36  --  55*  --   --   --  47*  --   --   ALT 27  --  33  --   --   --  26  --   --    ALKPHOS 107  --  100  --   --   --  136*  --   --   BILITOT 1.6*  --  2.0*  --   --   --  1.7*  --   --   PROT 6.4*  --  6.5  --   --   --  5.7*  --   --   ALBUMIN 1.9*  2.0*  < > 1.9*  1.9*  < > 1.7* 2.1* 1.8* 1.7* 1.8*  < > = values in this interval not displayed. No results for input(s): LIPASE, AMYLASE in the last 168 hours. No results for input(s): AMMONIA in the last 168 hours.  CBC:  Recent Labs Lab 02/13/15 0415  02/14/15 0530  02/15/15 0430  02/15/15 0916 02/15/15 0946 02/16/15 0445 02/17/15 0500 02/17/15 1800  WBC 23.8*  --  25.1*  --  15.5*  --   --   --  10.7* 12.1* 10.5  NEUTROABS 18.7*  --  20.5*  --   --   --   --   --   --   --   --   HGB 8.7*  < > 7.6*  < > 7.0*  < > 7.8* 8.5* 11.0* 6.5* 7.4*  HCT 27.6*  < > 23.8*  < > 22.6*  < > 23.0* 25.0* 34.1* 20.5* 22.7*  MCV 92.6  --  94.4  --  95.0  --   --   --  95.5 95.3 92.7  PLT 160  --  156  --  114*  --   --   --  104* 185 189  < > = values in this interval not displayed.  Cardiac Enzymes: No results for input(s): CKTOTAL, CKMB, CKMBINDEX, TROPONINI in the last 168 hours.  BNP: Invalid input(s): POCBNP  CBG:  Recent Labs Lab 02/17/15 1946 02/17/15 2256 02/18/15 0442 02/18/15 0815 02/18/15 0948  GLUCAP 124* 92 81 79 78    Microbiology: Results for orders placed or performed during the hospital encounter of 01/19/15  Culture, routine-abscess     Status: None   Collection Time: 01/20/15  1:00 PM  Result Value Ref Range Status   Specimen Description ABSCESS GALL BLADDER FOSSA  Final   Special Requests Normal  Final   Gram Stain   Final    ABUNDANT WBC PRESENT,BOTH PMN AND MONONUCLEAR NO SQUAMOUS EPITHELIAL CELLS SEEN NO ORGANISMS SEEN Performed at [facility]    Culture   Final    MULTIPLE ORGANISMS PRESENT, NONE PREDOMINANT Note: NO STAPHYLOCOCCUS AUREUS ISOLATED NO GROUP A STREP (S.PYOGENES) ISOLATED Performed at [facility]    Report Status 01/23/2015 FINAL  Final   Anaerobic culture     Status: None   Collection Time: 01/20/15  1:00 PM  Result Value Ref Range Status   Specimen Description ABSCESS GALL BLADDER FOSSA  Final   Special Requests Normal  Final   Gram Stain   Final    ABUNDANT WBC PRESENT,BOTH PMN AND MONONUCLEAR NO SQUAMOUS EPITHELIAL CELLS SEEN NO ORGANISMS SEEN Performed at [facility]    Culture   Final    NO ANAEROBES ISOLATED Performed at [facility]    Report Status 01/26/2015 FINAL  Final  MRSA PCR Screening     Status: None   Collection Time: 01/20/15  1:39 PM  Result Value Ref Range Status   MRSA by PCR NEGATIVE NEGATIVE Final    Comment:        The GeneXpert MRSA Assay (FDA approved for NASAL specimens only), is one component of a comprehensive MRSA colonization surveillance program. It is not intended to diagnose MRSA infection nor to guide or monitor treatment for MRSA infections. Performed at [facility]   Culture, blood (routine x 2)     Status: None   Collection Time: 01/20/15  1:50 PM  Result Value Ref Range Status   Specimen Description BLOOD RIGHT ARM  Final   Special Requests IN PEDIATRIC BOTTLE 4CC  Final   Culture   Final    NO GROWTH 5 DAYS Performed at [facility]    Report Status 01/25/2015 FINAL  Final  Culture, blood (routine x 2)     Status: None   Collection Time: 01/20/15  1:55 PM  Result Value Ref Range Status   Specimen Description BLOOD RIGHT HAND  Final   Special Requests IN PEDIATRIC BOTTLE 4CC  Final   Culture   Final    NO GROWTH 5 DAYS Performed at [facility]    Report Status 01/25/2015 FINAL  Final  Urine culture     Status: None   Collection Time: 01/30/15 11:40 AM  Result Value Ref Range Status   Specimen Description URINE, RANDOM  Final   Special Requests NONE  Final   Culture   Final    >=100,000 COLONIES/mL YEAST Performed at [facility]    Report Status 02/01/2015 FINAL  Final  Culture, body fluid-bottle      Status: None   Collection Time: 02/09/15  2:10 PM  Result Value Ref Range Status   Specimen Description FLUID PERITONEAL  Final   Special Requests BOTTLES DRAWN AEROBIC AND ANAEROBIC 10CC  Final   Culture   Final    NO GROWTH 5 DAYS Performed at [facility]    Report Status 02/14/2015 FINAL  Final  Gram stain     Status: None   Collection Time: 02/09/15  2:10 PM  Result Value Ref Range Status   Specimen Description FLUID PERITONEAL  Final   Special Requests BOTTLES DRAWN AEROBIC AND ANAEROBIC 10CC  Final   Gram Stain   Final    ABUNDANT WBC PRESENT,BOTH PMN AND MONONUCLEAR NO ORGANISMS SEEN Performed at [facility]    Report Status 02/09/2015 FINAL  Final  Culture, blood (routine x 2)     Status: None   Collection Time: 02/10/15  4:00 PM  Result Value Ref Range Status   Specimen Description BLOOD LEFT ANTECUBITAL  Final   Special Requests BOTTLES DRAWN AEROBIC ONLY 7CC  Final   Culture   Final    NO GROWTH 5 DAYS Performed at [facility]    Report Status 02/15/2015 FINAL  Final  Culture, blood (routine x 2)     Status: None   Collection Time: 02/10/15  4:15 PM  Result Value Ref Range Status   Specimen Description BLOOD LEFT FOREARM  Final   Special Requests BOTTLES DRAWN AEROBIC ONLY 5CC  Final   Culture   Final    NO GROWTH 5 DAYS Performed at [facility]    Report Status 02/15/2015 FINAL  Final  Culture, body fluid-bottle     Status: None (Preliminary result)   Collection Time: 02/16/15  3:56 PM  Result Value Ref Range Status   Specimen Description FLUID PERITONEAL  Final   Special Requests NONE  Final   Culture   Final    NO GROWTH < 24 HOURS Performed at [facility]    Report Status PENDING  Incomplete  Gram stain     Status: None   Collection Time: 02/16/15  3:56 PM  Result Value Ref Range Status   Specimen Description FLUID PERITONEAL  Final   Special Requests NONE  Final   Gram Stain   Final    ABUNDANT WBC  PRESENT,BOTH PMN AND MONONUCLEAR NO ORGANISMS SEEN Performed at [facility]    Report Status 02/16/2015 FINAL  Final  C difficile quick scan [name] PCR reflex     Status: None   Collection Time: 02/17/15 12:00 PM  Result Value Ref Range Status   C Diff antigen NEGATIVE NEGATIVE Final   C Diff toxin NEGATIVE NEGATIVE Final   C Diff interpretation Negative for toxigenic C. difficile  Final    Coagulation Studies: No results for input(s): LABPROT,  INR in the last 72 hours.  Urinalysis: No results for input(s): COLORURINE, LABSPEC, PHURINE, GLUCOSEU, HGBUR, BILIRUBINUR, KETONESUR, PROTEINUR, UROBILINOGEN, NITRITE, LEUKOCYTESUR in the last 72 hours.  Invalid input(s): APPERANCEUR    Imaging: US Paracentesis  02/16/2015   INDICATION: Patient with recent history of sepsis, gallbladder fossa abscess drainage, renal insufficiency, ascites. Request made for diagnostic and therapeutic paracentesis.  EXAM: ULTRASOUND-GUIDED DIAGNOSTIC AND THERAPEUTIC PARACENTESIS  COMPARISON:  Prior paracentesis on 02/09/2015  MEDICATIONS: None.  COMPLICATIONS: None immediate  TECHNIQUE: Informed written consent was obtained from the patient after a discussion of the risks, benefits and alternatives to treatment. A timeout was performed prior to the initiation of the procedure.  Initial ultrasound scanning demonstrates a moderate-to-large amount of ascites within the right lower abdominal quadrant. The right lower abdomen was prepped and draped in the usual sterile fashion. 1% lidocaine was used for local anesthesia. Under direct ultrasound guidance, a 19 gauge, 10-cm, Yueh catheter was introduced. An ultrasound image was saved for documentation purposed. The paracentesis was performed. The catheter was removed and a dressing was applied. The patient tolerated the procedure well without immediate post procedural complication.  FINDINGS: A total of approximately 3.1 liters of dark, bloody fluid was removed. Samples  were sent to the laboratory as requested by the clinical team.  IMPRESSION: Successful ultrasound-guided diagnostic and therapeutic paracentesis yielding 3.1 liters of peritoneal fluid. Critical care medicine notified of the above findings. The patient was given IV albumin during the procedure.  Read by: Jeananne Rama, PA-C   Electronically Signed   By: Irish Lack M.D.   On: 02/16/2015 16:56     Medications:   . sodium chloride 10 mL/hr at 02/18/15 0217  . TPN (CLINIMIX) Adult without lytes 80 mL/hr at 02/17/15 1712   And  . fat emulsion 240 mL (02/17/15 1712)  . TPN (CLINIMIX) Adult without lytes     And  . fat emulsion    . phenylephrine (NEO-SYNEPHRINE) Adult infusion 18 mcg/min (02/18/15 0935)   . sodium chloride   Intravenous Once  . anidulafungin  100 mg Intravenous Q24H  . antiseptic oral rinse  7 mL Mouth Rinse q12n4p  . chlorhexidine  15 mL Mouth Rinse BID  . darbepoetin (ARANESP) injection - NON-DIALYSIS  200 mcg Subcutaneous Q Wed-1800  . insulin aspart  0-20 Units Subcutaneous 6 times per day  . insulin glargine  30 Units Subcutaneous QHS  . ipratropium  0.5 mg Nebulization Q6H  . levalbuterol  0.63 mg Nebulization Q6H  . metoCLOPramide (REGLAN) injection  5 mg Intravenous 3 times per day  . nystatin   Topical TID  . pantoprazole (PROTONIX) IV  40 mg Intravenous Q24H  . piperacillin-tazobactam (ZOSYN)  IV  3.375 g Intravenous 3 times per day  . sodium chloride  10-40 mL Intracatheter Q12H   acetaminophen, fentaNYL (SUBLIMAZE) injection, iohexol, iohexol, ipratropium, levalbuterol, [DISCONTINUED] ondansetron **OR** ondansetron (ZOFRAN) IV, sodium chloride  Assessment/ Plan:   AKI prolonged ATN CRRT stopped 12/20  Good urine output  Electrolytes stabe  Anemia stable  Acid base controlled     LOS: 30 Larry [name]  :40 AM
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
## 2015-02-19 LAB — GLUCOSE, CAPILLARY
GLUCOSE-CAPILLARY: 96 mg/dL (ref 65–99)
GLUCOSE-CAPILLARY: 130 mg/dL — AB (ref 65–99)
Glucose-Capillary: 134 mg/dL — ABNORMAL HIGH (ref 65–99)
Glucose-Capillary: 90 mg/dL (ref 65–99)
Glucose-Capillary: 97 mg/dL (ref 65–99)
Glucose-Capillary: 166 mg/dL — ABNORMAL HIGH (ref 65–99)

## 2015-02-19 LAB — RENAL FUNCTION PANEL
ALBUMIN: 1.6 g/dL — AB (ref 3.5–5.0)
ANION GAP: 10 (ref 5–15)
Albumin: 1.7 g/dL — ABNORMAL LOW (ref 3.5–5.0)
Anion gap: 10 (ref 5–15)
BUN: 100 mg/dL — ABNORMAL HIGH (ref 6–20)
BUN: 100 mg/dL — AB (ref 6–20)
CHLORIDE: 95 mmol/L — AB (ref 101–111)
CHLORIDE: 101 mmol/L (ref 101–111)
CO2: 25 mmol/L (ref 22–32)
CO2: 25 mmol/L (ref 22–32)
CREATININE: 3.73 mg/dL — AB (ref 0.61–1.24)
CREATININE: 3.79 mg/dL — AB (ref 0.61–1.24)
Calcium: 7.2 mg/dL — ABNORMAL LOW (ref 8.9–10.3)
Calcium: 7.5 mg/dL — ABNORMAL LOW (ref 8.9–10.3)
GFR calc Af Amer: 17 mL/min — ABNORMAL LOW (ref >60)
GFR, EST AFRICAN AMERICAN: 18 mL/min — AB (ref >60)
GFR, EST NON AFRICAN AMERICAN: 15 mL/min — AB (ref >60)
GFR, EST NON AFRICAN AMERICAN: 15 mL/min — AB (ref >60)
GLUCOSE: 145 mg/dL — AB (ref 65–99)
Glucose, Bld: 315 mg/dL — ABNORMAL HIGH (ref 65–99)
Phosphorus: 5.7 mg/dL — ABNORMAL HIGH (ref 2.5–4.6)
Phosphorus: 5.9 mg/dL — ABNORMAL HIGH (ref 2.5–4.6)
Potassium: 3.3 mmol/L — ABNORMAL LOW (ref 3.5–5.1)
Potassium: 4 mmol/L (ref 3.5–5.1)
Sodium: 130 mmol/L — ABNORMAL LOW (ref 135–145)
Sodium: 136 mmol/L (ref 135–145)

## 2015-02-19 LAB — CBC
HEMATOCRIT: 24.3 % — AB (ref 39.0–52.0)
Hemoglobin: 7.7 g/dL — ABNORMAL LOW (ref 13.0–17.0)
MCH: 29.7 pg (ref 26.0–34.0)
MCHC: 31.7 g/dL (ref 30.0–36.0)
MCV: 93.8 fL (ref 78.0–100.0)
PLATELETS: 244 10*3/uL (ref 150–400)
RBC: 2.59 MIL/uL — AB (ref 4.22–5.81)
RDW: 24 % — AB (ref 11.5–15.5)
WBC: 9.5 10*3/uL (ref 4.0–10.5)

## 2015-02-19 LAB — MAGNESIUM: Magnesium: 1.8 mg/dL (ref 1.7–2.4)

## 2015-02-19 LAB — APTT: APTT: 33 s (ref 24–37)

## 2015-02-19 MED ORDER — POTASSIUM CHLORIDE 10 MEQ/50ML IV SOLN
10.0000 meq | INTRAVENOUS | Status: AC
  Administered 2015-02-19 (×2): 10 meq via INTRAVENOUS
  Filled 2015-02-19 (×2): qty 50

## 2015-02-19 MED ORDER — INSULIN GLARGINE 100 UNIT/ML ~~LOC~~ SOLN: 15.0000 [IU] | Freq: Every day | SUBCUTANEOUS | Status: DC

## 2015-02-19 MED ORDER — TRACE MINERALS CR-CU-MN-SE-ZN 10-1000-500-60 MCG/ML IV SOLN
INTRAVENOUS | Status: AC
  Administered 2015-02-19: 18:00:00 via INTRAVENOUS
  Filled 2015-02-19: qty 1920

## 2015-02-19 MED ORDER — INSULIN GLARGINE 100 UNIT/ML ~~LOC~~ SOLN
15.0000 [IU] | Freq: Every day | SUBCUTANEOUS | Status: DC
  Administered 2015-02-19 – 2015-02-20 (×2): 15 [IU] via SUBCUTANEOUS
  Filled 2015-02-19 (×2): qty 0.15

## 2015-02-19 MED ORDER — HYDROCORTISONE NA SUCCINATE PF 100 MG IJ SOLR
50.0000 mg | Freq: Four times a day (QID) | INTRAMUSCULAR | Status: DC
  Administered 2015-02-19 – 2015-02-23 (×17): 50 mg via INTRAVENOUS
  Filled 2015-02-19 (×17): qty 2

## 2015-02-19 MED ORDER — FAT EMULSION 20 % IV EMUL
240.0000 mL | INTRAVENOUS | Status: AC
  Administered 2015-02-19: 240 mL via INTRAVENOUS
  Filled 2015-02-19: qty 250

## 2015-02-19 NOTE — Progress Notes (Signed)
Patient ID: Larry [name], male   DOB: 1945-10-18, 69 y.o.   MRN: [id number]  General Surgery [facility]  Subjective: Patient awake, wants "his butt cleaned" this morning.  Ambulated a little yesterday.  Objective: Vital signs in last 24 hours: Temp:  [98.1 F (36.7 C)-98.9 F (37.2 C)] 98.1 F (36.7 C) (09/03 0436) Pulse Rate:  [36-128] 96 (09/03 0700) Resp:  [16-32] 26 (09/03 0700) BP: (78-140)/(20-94) 110/42 mmHg (09/03 0700) SpO2:  [94 %-100 %] 97 % (09/03 0700) Weight:  [113.5 kg (250 lb 3.6 oz)] 113.5 kg (250 lb 3.6 oz) (09/03 0500) Last BM Date: 02/19/15  Intake/Output from previous day: 09/02 0701 - 09/03 0700 In: 3139.1 [I.V.:700.5; IV Piggyback:280; TPN:2158.7] Out: 2795 [Urine:2295; Emesis/NG output:500] Intake/Output this shift:    Physical Exam: HEENT - sclerae clear, mucous membranes moist Neck - soft, line site clear Chest - clear bilaterally Cor - RRR Abdomen - soft, obese; rare BS present; NG with thin bilious >500cc since midnight  Lab Results:   Recent Labs  02/17/15 1800 02/19/15 0504  WBC 10.5 9.5  HGB 7.4* 7.7*  HCT 22.7* 24.3*  PLT 189 244   BMET  Recent Labs  02/18/15 1653 02/19/15 0504  NA 136 130*  K 3.6 3.3*  CL 97* 95*  CO2 30 25  GLUCOSE 208* 315*  BUN 100* 100*  CREATININE 4.02* 3.73*  CALCIUM 7.5* 7.2*   PT/INR No results for input(s): LABPROT, INR in the last 72 hours. Comprehensive Metabolic Panel:    Component Value Date/Time   NA 130* 02/19/2015 0504   NA 136 02/18/2015 1653   K 3.3* 02/19/2015 0504   K 3.6 02/18/2015 1653   CL 95* 02/19/2015 0504   CL 97* 02/18/2015 1653   CO2 25 02/19/2015 0504   CO2 30 02/18/2015 1653   BUN 100* 02/19/2015 0504   BUN 100* 02/18/2015 1653   CREATININE 3.73* 02/19/2015 0504   CREATININE 4.02* 02/18/2015 1653   GLUCOSE 315* 02/19/2015 0504   GLUCOSE 208* 02/18/2015 1653   CALCIUM 7.2* 02/19/2015 0504   CALCIUM 7.5* 02/18/2015 1653   AST 47*  02/17/2015 0500   AST 55* 02/14/2015 0530   ALT 26 02/17/2015 0500   ALT 33 02/14/2015 0530   ALKPHOS 136* 02/17/2015 0500   ALKPHOS 100 02/14/2015 0530   BILITOT 1.7* 02/17/2015 0500   BILITOT 2.0* 02/14/2015 0530   PROT 5.7* 02/17/2015 0500   PROT 6.5 02/14/2015 0530   ALBUMIN 1.7* 02/19/2015 0504   ALBUMIN 1.7* 02/18/2015 1653    Studies/Results: No results found.  Anti-infectives: Anti-infectives    Start     Dose/Rate Route Frequency Ordered Stop   02/17/15 1400  vancomycin (VANCOCIN) IVPB 1000 mg/200 mL premix     1,000 mg 200 mL/hr over 60 Minutes Intravenous  Once 02/17/15 1320 02/17/15 1502   02/16/15 2200  piperacillin-tazobactam (ZOSYN) IVPB 3.375 g     3.375 g 12.5 mL/hr over 240 Minutes Intravenous 3 times per day 02/16/15 1603     02/15/15 2200  meropenem (MERREM) 500 mg in sodium chloride 0.9 % 50 mL IVPB  Status:  Discontinued     500 mg 100 mL/hr over 30 Minutes Intravenous Every 12 hours 02/15/15 1415 02/16/15 1533   02/14/15 1200  vancomycin (VANCOCIN) IVPB 1000 mg/200 mL premix  Status:  Discontinued     1,000 mg 200 mL/hr over 60 Minutes Intravenous Every 24 hours 02/13/15 0920 02/16/15 0706   02/13/15 1000  vancomycin (  VANCOCIN) 1,750 mg in sodium chloride 0.9 % 500 mL IVPB     1,750 mg 250 mL/hr over 120 Minutes Intravenous  Once 02/13/15 0836 02/13/15 1210   02/12/15 1630  meropenem (MERREM) 500 mg in sodium chloride 0.9 % 50 mL IVPB  Status:  Discontinued     500 mg 100 mL/hr over 30 Minutes Intravenous Every 6 hours 02/12/15 1106 02/15/15 1415   02/11/15 1800  anidulafungin (ERAXIS) 100 mg in sodium chloride 0.9 % 100 mL IVPB     100 mg over 90 Minutes Intravenous Every 24 hours 02/10/15 1744     02/10/15 1730  anidulafungin (ERAXIS) 200 mg in sodium chloride 0.9 % 200 mL IVPB     200 mg over 180 Minutes Intravenous  Once 02/10/15 1720 02/10/15 2230   02/10/15 0200  meropenem (MERREM) 500 mg in sodium chloride 0.9 % 50 mL IVPB  Status:   Discontinued     500 mg 100 mL/hr over 30 Minutes Intravenous Every 8 hours 02/09/15 1808 02/12/15 1106   02/10/15 0000  meropenem (MERREM) 500 mg in sodium chloride 0.9 % 50 mL IVPB  Status:  Discontinued     500 mg 100 mL/hr over 30 Minutes Intravenous 4 times per day 02/09/15 1655 02/09/15 1808   02/09/15 1800  meropenem (MERREM) 1 g in sodium chloride 0.9 % 100 mL IVPB     1 g 200 mL/hr over 30 Minutes Intravenous  Once 02/09/15 1655 02/09/15 1818   02/08/15 1800  piperacillin-tazobactam (ZOSYN) IVPB 3.375 g  Status:  Discontinued     3.375 g 100 mL/hr over 30 Minutes Intravenous 4 times per day 02/08/15 1403 02/09/15 1655   02/07/15 1400  piperacillin-tazobactam (ZOSYN) IVPB 2.25 g  Status:  Discontinued     2.25 g 100 mL/hr over 30 Minutes Intravenous 3 times per day 02/07/15 0750 02/08/15 1403   02/04/15 1200  piperacillin-tazobactam (ZOSYN) IVPB 2.25 g  Status:  Discontinued     2.25 g 100 mL/hr over 30 Minutes Intravenous 4 times per day 02/04/15 0859 02/04/15 0905   02/04/15 1200  piperacillin-tazobactam (ZOSYN) IVPB 3.375 g  Status:  Discontinued     3.375 g 12.5 mL/hr over 240 Minutes Intravenous Every 8 hours 02/04/15 0905 02/07/15 0750   02/02/15 2000  fluconazole (DIFLUCAN) tablet 100 mg     100 mg Oral Every 24 hours 02/02/15 1908 02/08/15 2041   02/02/15 1800  fluconazole (DIFLUCAN) tablet 100 mg  Status:  Discontinued     100 mg Oral Daily 02/02/15 1545 02/02/15 1901   01/31/15 1000  fluconazole (DIFLUCAN) tablet 100 mg  Status:  Discontinued     100 mg Oral Daily 01/31/15 0727 02/02/15 1313   01/29/15 1700  piperacillin-tazobactam (ZOSYN) IVPB 2.25 g  Status:  Discontinued     2.25 g 100 mL/hr over 30 Minutes Intravenous Every 8 hours 01/29/15 0845 02/04/15 0859   01/24/15 2200  vancomycin (VANCOCIN) IVPB 1000 mg/200 mL premix     1,000 mg 200 mL/hr over 60 Minutes Intravenous  Once 01/24/15 1349 01/24/15 2301   01/21/15 2000  vancomycin (VANCOCIN) 1,500 mg in  sodium chloride 0.9 % 500 mL IVPB  Status:  Discontinued     1,500 mg 250 mL/hr over 120 Minutes Intravenous Every 24 hours 01/21/15 1048 01/23/15 1726   01/20/15 2000  vancomycin (VANCOCIN) 1,250 mg in sodium chloride 0.9 % 250 mL IVPB  Status:  Discontinued     1,250 mg 166.7 mL/hr over 90 Minutes  Intravenous Every 24 hours 01/19/15 1945 01/20/15 0843   01/20/15 2000  vancomycin (VANCOCIN) 1,250 mg in sodium chloride 0.9 % 250 mL IVPB  Status:  Discontinued     1,250 mg 166.7 mL/hr over 90 Minutes Intravenous Every 24 hours 01/20/15 1519 01/21/15 1048   01/20/15 0000  piperacillin-tazobactam (ZOSYN) IVPB 3.375 g  Status:  Discontinued     3.375 g 12.5 mL/hr over 240 Minutes Intravenous Every 8 hours 01/19/15 1946 01/29/15 0845   01/19/15 1715  vancomycin (VANCOCIN) 2,500 mg in sodium chloride 0.9 % 500 mL IVPB     2,500 mg 250 mL/hr over 120 Minutes Intravenous  Once 01/19/15 1707 01/19/15 2159   01/19/15 1715  piperacillin-tazobactam (ZOSYN) IVPB 3.375 g     3.375 g 100 mL/hr over 30 Minutes Intravenous  Once 01/19/15 1707 01/19/15 1900      Assessment & Plans: Status post interval lap chole 7/18 - complicated by postop hematoma/sepsis Readmitted 8/4 for sepsis, aki on ckd, acute on chronic anemia  ID - Repeat paracentesis cultures NGTD.  Vanco discontinued.  On Zosyn and Eraxis.  Per ID recommendations.  Renal - BUN/Cr going up again, making good urine. Defer to renal on whether or not pt will need dialysis.  GI - having multiple BMs. On reglan. Cont TPN.  NG output remains elevated, monitor, do not clamp NG tube yet.  Heme - low Hgb.  Would not restart systemic IV heparin for PAF.  SCDs only for DVT prophylaxis.  PT/OT - out of bed to chair, ambulate with assist.   Velora Heckler, MD, [facility] Office: [phone]   [place] Judie Petit 02/19/2015

## 2015-02-19 NOTE — Progress Notes (Signed)
PARENTERAL NUTRITION CONSULT NOTE   Pharmacy Consult for TPN Indication: Prolonged ileus  No Known Allergies  Patient Measurements: Height: _0  (167.6 cm) Weight: 250 lb 3.6 oz (113.5 kg) IBW/kg (Calculated) : 63.8 Adjusted Body Weight: 76.2 kg, recalculated 9/3 Usual Weight:   Vital Signs: Temp: 99 F (37.2 C) (09/03 0800) Temp Source: Oral (09/03 0800) BP: 111/42 mmHg (09/03 0930) Pulse Rate: 93 (09/03 0930) Intake/Output from previous day: 09/02 0701 - 09/03 0700 In: 3139.1 [I.V.:700.5; IV Piggyback:280; TPN:2158.7] Out: 3595 [Urine:2295; Emesis/NG output:1300] Intake/Output from this shift:    Labs:  Recent Labs  02/17/15 0500 02/17/15 1800 02/18/15 0453 02/19/15 0504  WBC 12.1* 10.5  --  9.5  HGB 6.5* 7.4*  --  7.7*  HCT 20.5* 22.7*  --  24.3*  PLT 185 189  --  244  APTT 34  --  31 33     Recent Labs  02/17/15 0500  02/18/15 0453 02/18/15 1653 02/19/15 0504  NA 134*  < > 138 136 130*  K 4.5  < > 3.9 3.6 3.3*  CL 93*  < > 97* 97* 95*  CO2 30  < > _1 GLUCOSE 103*  < > 88 208* 315*  BUN 95*  < > 97* 100* 100*  CREATININE 3.82*  < > 4.04* 4.02* 3.73*  CALCIUM 7.7*  < > 7.8* 7.5* 7.2*  MG 2.2  --  1.9  --  1.8  PHOS 6.3*  < > 6.1* 6.1* 5.7*  PROT 5.7*  --   --   --   --   ALBUMIN 1.8*  < > 1.8* 1.7* 1.7*  AST 47*  --   --   --   --   ALT 26  --   --   --   --   ALKPHOS 136*  --   --   --   --   BILITOT 1.7*  --   --   --   --   < > = values in this interval not displayed. Estimated Creatinine Clearance: 22.4 mL/min (by C-G formula based on Cr of 3.73).    Recent Labs  02/18/15 2325 02/19/15 0434 02/19/15 0814  GLUCAP 96 134* 90    Insulin Requirements:  3 units of Novolog SSI + Lantus 35 units + 40 units regular insulin provided by TPN  Current Nutrition: NPO, unable to tolerate TF  IVF: NS KVO  Central access: PICC  TPN start date:  8/22  ASSESSMENT                                                                                                           HPI: [id number] YOM presents with worsening abdominal pain on 8/3.  Recent cholecystectomy on 01/04/2015 with drain removed 2 weeks prior to admission. Found to have GB fossa fluid collection (infected hematoma) and abscess. IR placed GB fossa drain.  Pharmacy asked to start TPN 8/22 for prolonged ileus and interment vomiting prohibiting use of enteral nutrition.    Significant events:  8/3  septic shock from infected GB fossa 8/22 orders to start TPN 8/23 Start CRRT 8/25 intubated 8/29 extubated 8/30 CRRT stopped 9/2: Reglan restarted.  Having multiple BMs.  9/3: NG output still high.  Holding on clamping trials for now.   Today, 02/19/2015:    Glucose - controlled now on Lantus qhs + SSI + insulin in TPN. Steroids tapered off.  Patient is on Lantus 40 units daily PTA. Lantus held last night due to CBG of 98 and dose reduced. Today serum glucose elevated up to 315 however CBGs remain within goal.    Electrolytes:  Na low.   K+ trending down, now sl low.  MD replacing.    Mg WNL, trending down  Phos elevated and increased off of CRRT, however a little improved today.  Electrolytes removed from TPN.  Protein supplementation at ~1.3 g/kg/day (adjusted body weight)  CorrCa WNL. CaPhos product 51.  Renal - oliguric AKI. CRRT 8/23-8/30.   24h I/O = -52.2  SCr a little better, CRRT stopped 8/30 AM, hoping for renal recovery  Urine output documented as 0.8 mL/kg/hr yesterday, continues to make urine today.  LFTs - AST/ALT, Alk Phos ok, tbili 1.7  TGs - 155 (8/23), 175 (8/29)  Prealbumin - 5.3 (8/22), 10 (8/29)  NUTRITIONAL GOALS                                                                                             Updated RD recs 8/31 (now that off CRRT): 1950-2150 kcals, protein 95-105 g (fluid 2L/day) Clinimix 5/15 at a goal rate of 45m/hr + 20% fat emulsion at 169mhr to provide: 96g/day protein, 1843Kcal/day.  PLAN                                                                                                              At 1800 today:  Continue Clinimix 5/15 (WITHOUT lytes) at 80 ml/hr.  Patient is off of CRRT, watching for recovery.  Protein supplementation at ~1.3 g/kg/day of adjusted body weight.  Concerned that reducing protein supplementation further would not benefit this acutely ill patient.  BUN may rise regardless of exogenous protein supplementation in this catabolic patient; however, if BUN continues to increase, may consider reducing protein provided by TPN.   Continue regular insulin in TPN but reduce to 25 units of regular insulin over 24 hour infusion.  Would prefer to add insulin to TPN rather than increase Lantus dose in case TPN is abruptly stopped. Ttapered off stress dose steroids 9/1.  Continue 20% lipid emulsion at 10 ml/hr.  Held lipids for first week of ICU admission.  Resumed 8/30.   TPN to contain standard multivitamins and  trace elements.  Continue to provide all trace elements for now.  Will need to consider removing chromium and selenium if SCr does not show improvement over next several days and remains on TPN.  Monitor electrolytes closely.  Consider adding electrolytes back to TPN once phos WNL if all other lytes still trending down.   Potassium chloride 59mq q1h x 2 per MD  Additional potassium chloride 174m q1h x 2 per PharmD  Check renal panel this afternoon and replete again as needed  CBG monitoring, SSI q4h  Daily renal function panel, Mg and Phos ordered per MD   F/u daily.    CoRalene BathePharmD, BCPS 02/19/2015, 10:31 AM  Pager: [phone]

## 2015-02-19 NOTE — Progress Notes (Signed)
Patient has scheduled CVP q4hrs. Difficulty obtaining accurate CVP for 0400. Pt.also has continuous IV infusion running.Two RN's assessed lines and attempted to get accurate CVP to register. Line zeroed and flushed. Oncoming RN was notified that 0400 could not be obtained.

## 2015-02-19 NOTE — Progress Notes (Signed)
Admit: 01/19/2015 LOS: 31  78M AKI 2/2 ATN (req CRRT through 8/30 w/ septic shock infected GB fossa, VDRF  Subjective:  Improved SCr Great UOP  No new issues  09/02 0701 - 09/03 0700 In: 3139.1 [I.V.:700.5; IV Piggyback:280; TPN:2158.7] Out: 3595 [Urine:2295; Emesis/NG output:1300]  Filed Weights   02/17/15 0500 02/18/15 0500 02/19/15 0500  Weight: 115.5 kg (254 lb 10.1 oz) 113.9 kg (251 lb 1.7 oz) 113.5 kg (250 lb 3.6 oz)    Scheduled Meds: . sodium chloride   Intravenous Once  . anidulafungin  100 mg Intravenous Q24H  . antiseptic oral rinse  7 mL Mouth Rinse q12n4p  . chlorhexidine  15 mL Mouth Rinse BID  . darbepoetin (ARANESP) injection - NON-DIALYSIS  200 mcg Subcutaneous Q Wed-1800  . hydrocortisone sodium succinate  50 mg Intravenous Q6H  . insulin aspart  0-20 Units Subcutaneous 6 times per day  . insulin glargine  15 Units Subcutaneous Daily  . ipratropium  0.5 mg Nebulization Q6H  . levalbuterol  0.63 mg Nebulization Q6H  . metoCLOPramide (REGLAN) injection  5 mg Intravenous 3 times per day  . nystatin   Topical TID  . pantoprazole (PROTONIX) IV  40 mg Intravenous Q24H  . piperacillin-tazobactam (ZOSYN)  IV  3.375 g Intravenous 3 times per day  . potassium chloride  10 mEq Intravenous Q1 Hr x 2  . potassium chloride  10 mEq Intravenous Q1 Hr x 2  . sodium chloride  10-40 mL Intracatheter Q12H   Continuous Infusions: . sodium chloride 10 mL/hr at 02/18/15 1900  . TPN (CLINIMIX) Adult without lytes 80 mL/hr at 02/18/15 1900   And  . fat emulsion 240 mL (02/18/15 1900)  . TPN (CLINIMIX) Adult without lytes     And  . fat emulsion    . phenylephrine (NEO-SYNEPHRINE) Adult infusion 30 mcg/min (02/19/15 0135)   PRN Meds:.acetaminophen, fentaNYL (SUBLIMAZE) injection, iohexol, iohexol, ipratropium, levalbuterol, [DISCONTINUED] ondansetron **OR** ondansetron (ZOFRAN) IV, sodium chloride  Current Labs: reviewed    Physical Exam:  Blood pressure 111/42, pulse 93,  temperature 99 F (37.2 C), temperature source Oral, resp. rate 15, height  (1.676 m), weight 113.5 kg (250 lb 3.6 oz), SpO2 98 %. Frail, NAD NGT ijn place  RIJ HD Cath RRR Foley in place  A/P 1. AKI 2/2 ATN: appears to have slow recovery, good UOP, Lytes ok.  No HD needs.  If further imiproved over next 24-48h need to remove HD cath  Sabra Heck MD 02/19/2015, 11:15 AM   Recent Labs Lab 02/18/15 0453 02/18/15 1653 02/19/15 0504  NA 138 136 130*  K 3.9 3.6 3.3*  CL 97* 97* 95*  CO2 GLUCOSE 88 208* 315*  BUN 97* 100* 100*  CREATININE 4.04* 4.02* 3.73*  CALCIUM 7.8* 7.5* 7.2*  PHOS 6.1* 6.1* 5.7*    Recent Labs Lab 02/13/15 0415  02/14/15 0530  02/17/15 0500 02/17/15 1800 02/19/15 0504  WBC 23.8*  --  25.1*  < > 12.1* 10.5 9.5  NEUTROABS 18.7*  --  20.5*  --   --   --   --   HGB 8.7*  < > 7.6*  < > 6.5* 7.4* 7.7*  HCT 27.6*  < > 23.8*  < > 20.5* 22.7* 24.3*  MCV 92.6  --  94.4  < > 95.3 92.7 93.8  PLT 160  --  156  < > 185 189 244  < > = values in this interval not displayed.

## 2015-02-19 NOTE — Progress Notes (Signed)
PULMONARY / CRITICAL CARE MEDICINE   Name: Larry [name] MRN: [id number] DOB: 1945-07-24    ADMISSION DATE:  01/19/2015 CONSULTATION DATE:  8/4  REFERRING MD :  [name]  CHIEF COMPLAINT:  Septic shock   INITIAL PRESENTATION:  69 year old male w/ CAF on Elliquis who recently underwent lap chole on 7/14. Was discharged home w/drain. Drain removed about a week before presentation to ER. Admitted directly from [facility] on 8/3 w/ septic shock which was likely due to infected hematoma in the GB fossa. PCCM asked to see after new perc drain placed on 8/4, when he remained hypotensive.  Required CRRT 8/23 for AKI  Intubated 8/26 for desaturations, significant agitation, delirium  STUDIES:  CT abdomen/Pelvis W/ 7/28 - Gas & fluid at cholecystectomy site w/o discrete abscess. Min ascites. Small right pleural eff. CT abdomen/pelvis w/o 8/5 - Gas between liver & stomach. No additional free air. Increased flow void & high density layering c/w increasing hemorrhage as well as increased right pleural eff. CT abdomen/pelvis 8/18 >> 9.8 cm hematoma with gas in the gallbladder fossa, indwelling pigtail drainage catheter, 12.6 cm hematoma along the posterior R liver, mod ascites, mild wall thickening involving the R colon CT abdomen/pelvis 8/22 >> stable perihepatic hematoma, stable gallbladder fossa collection despite adequate drainage cath placement, significant free fluid within the abdomen/pelvis slightly increased from prior exam 8/31 CT abd >>persistent large fluid collection at the gallbladder fossa 11.7 x 9.0 x 8.5 cm previously 9.7 x 9.7 x 8.8 cm. Additional subhepatic collection anteromedial to gallbladder collection containing air and fluid, decreased in size now measuring 5.0 x 5.0 x 4.4 cm, Significant ascites  SIGNIFICANT EVENTS: Admitted 8/3 w/ abd pain and hypotension  8/04  hypotensive over-night. Went for [facility] drain of GB fossa. Found what looked like old infected hematoma. PCCM  asked to see post-op for shock 8/05  off pressors. WOB worse.  8/06  WOB & wheezing slightly worse. Progressing abdominal pain 8/07  Hgb declining w/ increasing intraperitoneal hemorrhage 8/07  Transfused 2u PRBCs 8/09  work of breathing still significant w/ marked upper airway component  8/10  breathing better. 1.6 liters negative. Slight bump in scr. Vanc stopped. Getting OOB for first time 8/11  eating regular diet. Up in chair. Feeling better.  Respiratory status improved.  PCCM s/o 8/14  Renal consulted for rising sr cr (to 3.60), baseline sr cr 1.1-1.5 (prior IV contrast 7/28).   8/14  Concern for yeast / bacterial UTI  8/15  ID consulted > abx narrowed to zosyn only, fluconazole for yeast  8/16  UOP increased on IV lasix 8/18  NPO, sr cr improving, CT abd as above  8/18  GB drain upsized per IR to 16 Fr 8/19  Abd pain, eating well.  Diarrhea after ensure.  To PO lasix.  Discussed gastrostomy tube with IR 8/22  Sr Cr rising, intermittent vomiting.  CT abd as above.  Increased confusion 8/23  HD cath inserted, CRRT initiated.  Gastrostomy tube held off due to CRRT & Hgb drop 8/24  WBC elevated, cultures repeated.   8/24  Paracentesis >> 150 ml "bloody bile" fluid removed, loculated and difficult to aspirate 8/25  Palliative care consulted, PCCM consulted.  Vomiting with NGT insertion.  8/26  Intubated early am(0200) for desaturations, significant agitation, delirium  8/30 CRRT stopped, bilious vomiting/secretions 8/31 Paracentesis >> 3.1 L 9/1 1 U PRBC  SUBJECTIVE:   Shock remains, neo  VITAL SIGNS: Temp:  [98.1 F (  36.7 C)-99 F (37.2 C)] 99 F (37.2 C) (09/03 0800) Pulse Rate:  [36-128] 93 (09/03 0930) Resp:  [15-32] 15 (09/03 0930) BP: (78-140)/(20-94) 111/42 mmHg (09/03 0930) SpO2:  [95 %-100 %] 98 % (09/03 0930) Weight:  [113.5 kg (250 lb 3.6 oz)] 113.5 kg (250 lb 3.6 oz) (09/03 0500)  HEMODYNAMICS: CVP:  [10 mmHg-11 mmHg] 11 mmHg   VENTILATOR SETTINGS:      INTAKE / OUTPUT:  Intake/Output Summary (Last 24 hours) at 02/19/15 0953 Last data filed at 02/19/15 0700  Gross per 24 hour  Intake 2904.12 ml  Output   3245 ml  Net -340.88 ml    PHYSICAL EXAMINATION: General:  Acutely ill adult male , in bed Neuro:  Interactive, nonfocal HEENT:  HD cath site clean, no discharge Cardiovascular:  s1s2 rrr, no m/r/g Lungs:  Clear Abdomen:  RUQ perc drain in place with almost none drainage. Hypoactive BS. Non- tender Musculoskeletal:  No joint effusions or deformities. Skin:  gen edema  LABS:  CBC  Recent Labs Lab 02/17/15 0500 02/17/15 1800 02/19/15 0504  WBC 12.1* 10.5 9.5  HGB 6.5* 7.4* 7.7*  HCT 20.5* 22.7* 24.3*  PLT 185 189 244   Coag's  Recent Labs Lab 02/17/15 0500 02/18/15 0453 02/19/15 0504  APTT 34 31 33   BMET  Recent Labs Lab 02/18/15 0453 02/18/15 1653 02/19/15 0504  NA 138 136 130*  K 3.9 3.6 3.3*  CL 97* 97* 95*  CO2 BUN 97* 100* 100*  CREATININE 4.04* 4.02* 3.73*  GLUCOSE 88 208* 315*   Electrolytes  Recent Labs Lab 02/17/15 0500  02/18/15 0453 02/18/15 1653 02/19/15 0504  CALCIUM 7.7*  < > 7.8* 7.5* 7.2*  MG 2.2  --  1.9  --  1.8  PHOS 6.3*  < > 6.1* 6.1* 5.7*  < > = values in this interval not displayed. Sepsis Markers No results for input(s): LATICACIDVEN, PROCALCITON, O2SATVEN in the last 168 hours.   ABG  Recent Labs Lab 02/12/15 1205 02/13/15 1300 02/14/15 0842  PHART 7.428 7.384 7.420  PCO2ART 43.0 56.4* 52.3*  PO2ART 77.8* 72.2* 73.7*   Liver Enzymes  Recent Labs Lab 02/13/15 0415  02/14/15 0530  02/17/15 0500  02/18/15 0453 02/18/15 1653 02/19/15 0504  AST 36  --  55*  --  47*  --   --   --   --   ALT 27  --  33  --  26  --   --   --   --   ALKPHOS 107  --  100  --  136*  --   --   --   --   BILITOT 1.6*  --  2.0*  --  1.7*  --   --   --   --   ALBUMIN 1.9*  2.0*  < > 1.9*  1.9*  < > 1.8*  < > 1.8* 1.7* 1.7*  < > = values in this interval not  displayed.   Cardiac Enzymes No results for input(s): TROPONINI, PROBNP in the last 168 hours.   Glucose  Recent Labs Lab 02/18/15 1241 02/18/15 1554 02/18/15 1949 02/18/15 2325 02/19/15 0434 02/19/15 0814  GLUCAP 79 82 98 96 134* 90    Imaging No results found.  ASSESSMENT / PLAN:  PULMONARY A: ETT 8/26 >> 8/29 Acute Hypoxic Respiratory Failure - in the setting of significant Agitation, renal fx, delirium  - resolved Chronically elevated right HD Right pleural effusion -  progessing on Abd CT 8/6 P:   Xopenex neb q6hr PRN IS q 1h when awake  CARDIOVASCULAR CVL Right IJ 8/4 >> 8/22 RUE PICC 8/22 >>  A:  Severe sepsis/septic shock - remains Hypotension - 8/26, started on neo post intubation, smouldering sepsis Hx PAF - CHADVASC score 4; currently NSR  Prior NSTEMI  Adrenal insuff P:  Neo gtt  for MAP > 60 keep stress steroids as shock remains If remains on pressors, wil consider restart of vaso, i worry about vaso deficiency on going cvp awaited then likely to dc  RENAL A:   AKI- in setting of septic shock, prior hypotension/ATN Non oliguric now Hypo k  P:   supp k  tpn Chem in am  Keep HD cath cvp awaited  GASTROINTESTINAL A:   Infected hematoma s/p cholecystectomy - s/p CT guided Perc drain with subsequent upsize to 16 Fr, Paracentesis 8/31 c/w peritonitis GERD - worse 8/5 w/ associated UAW wheeze Intra-abdominal hemorrhage / hematoma  Nausea / Vomiting - concern for ileus (8/25) Protein Calorie Malnutrition   P:   TNA per pharmacy  Protonix QD NPO Ct NG to LIS - clamp on 9/3 & advance PO if tolerates reglan added wil discuss post pyloric options with ccs  HEMATOLOGIC A:   Anemia - in setting of intra-abdominal hemorrhage (8/4).  S/p 2 u PRBC on 8/4,2u 8/7  , 1 U 9/1 P:  Trend CBC  SCDs Transfuse for Hbg < 7  INFECTIOUS A:   Severe sepsis/septic shock - presume source is infected hematoma from recent Cholecystectomy. Shock resolved.   S/p Perc GB fossa site drain 8/4 - minimal serous output High Risk for Fungemia P:   ID following, appreciate input   Abscess 8/4>>>neg BCX2 8/4>>>neg  Peritoneal fluid GS 8/24 >> abundant WBC, no organisms seen Peritoneal fluid culture 8/24 >>  BCx2 8/25 >> ng Urine 8/14 >> yeast  Ascitic fluid 8/31 >ng c diff neg 9/1  Meropenem 8/24 >>  Fluconazole 8/15 >> 8/23 anigulofungin 8/26>>> vanc 8/28>>>8/31  ENDOCRINE A: DM - CBG's better  Controlled, actually low AI P:   SSI Q4 decrease Lantus further insulin to TNA  NEUROLOGIC A:  Delirium  Critical Illness Deconditioning  P:   RASS goal: 0 Delirium prevention measures: minimize sedating medications, promote sleep / wake cycle  Fentanyl prn PT crutial   FAMILY  - Updates:  D-in law 8/30  - Inter-disciplinary family meet or Palliative Care meeting due by:  Ongoing, family wants to continue full aggressive measures.  Hopeful that patient will be able to recover.    Ccm time 30 min   Mcarthur Rossetti. Tyson Alias, MD, FACP Pgr: [phone] [facility]

## 2015-02-20 LAB — RENAL FUNCTION PANEL
ALBUMIN: 1.7 g/dL — AB (ref 3.5–5.0)
ALBUMIN: 1.9 g/dL — AB (ref 3.5–5.0)
ANION GAP: 10 (ref 5–15)
Anion gap: 11 (ref 5–15)
BUN: 103 mg/dL — AB (ref 6–20)
BUN: 100 mg/dL — AB (ref 6–20)
CHLORIDE: 100 mmol/L — AB (ref 101–111)
CHLORIDE: 103 mmol/L (ref 101–111)
CO2: 25 mmol/L (ref 22–32)
CO2: 23 mmol/L (ref 22–32)
CREATININE: 3.64 mg/dL — AB (ref 0.61–1.24)
Calcium: 7.7 mg/dL — ABNORMAL LOW (ref 8.9–10.3)
Calcium: 7.7 mg/dL — ABNORMAL LOW (ref 8.9–10.3)
Creatinine, Ser: 3.46 mg/dL — ABNORMAL HIGH (ref 0.61–1.24)
GFR calc Af Amer: 18 mL/min — ABNORMAL LOW (ref >60)
GFR calc Af Amer: 19 mL/min — ABNORMAL LOW (ref >60)
GFR calc non Af Amer: 17 mL/min — ABNORMAL LOW (ref >60)
GFR, EST NON AFRICAN AMERICAN: 16 mL/min — AB (ref >60)
GLUCOSE: 216 mg/dL — AB (ref 65–99)
GLUCOSE: 260 mg/dL — AB (ref 65–99)
POTASSIUM: 3.5 mmol/L (ref 3.5–5.1)
Phosphorus: 5.5 mg/dL — ABNORMAL HIGH (ref 2.5–4.6)
Phosphorus: 4.6 mg/dL (ref 2.5–4.6)
Potassium: 3.3 mmol/L — ABNORMAL LOW (ref 3.5–5.1)
Sodium: 136 mmol/L (ref 135–145)
Sodium: 136 mmol/L (ref 135–145)

## 2015-02-20 LAB — GLUCOSE, CAPILLARY
GLUCOSE-CAPILLARY: 234 mg/dL — AB (ref 65–99)
Glucose-Capillary: 179 mg/dL — ABNORMAL HIGH (ref 65–99)
Glucose-Capillary: 201 mg/dL — ABNORMAL HIGH (ref 65–99)
Glucose-Capillary: 218 mg/dL — ABNORMAL HIGH (ref 65–99)
Glucose-Capillary: 244 mg/dL — ABNORMAL HIGH (ref 65–99)

## 2015-02-20 LAB — BASIC METABOLIC PANEL
Anion gap: 10 (ref 5–15)
BUN: 103 mg/dL — AB (ref 6–20)
CALCIUM: 7.7 mg/dL — AB (ref 8.9–10.3)
CO2: 25 mmol/L (ref 22–32)
CREATININE: 3.63 mg/dL — AB (ref 0.61–1.24)
Chloride: 101 mmol/L (ref 101–111)
GFR calc non Af Amer: 16 mL/min — ABNORMAL LOW (ref >60)
GFR, EST AFRICAN AMERICAN: 18 mL/min — AB (ref >60)
GLUCOSE: 217 mg/dL — AB (ref 65–99)
Potassium: 3.3 mmol/L — ABNORMAL LOW (ref 3.5–5.1)
Sodium: 136 mmol/L (ref 135–145)

## 2015-02-20 LAB — APTT: APTT: 37 s (ref 24–37)

## 2015-02-20 LAB — MAGNESIUM
MAGNESIUM: 1.8 mg/dL (ref 1.7–2.4)
Magnesium: 1.8 mg/dL (ref 1.7–2.4)

## 2015-02-20 LAB — CBC
HEMATOCRIT: 24.9 % — AB (ref 39.0–52.0)
Hemoglobin: 8 g/dL — ABNORMAL LOW (ref 13.0–17.0)
MCH: 29.9 pg (ref 26.0–34.0)
MCHC: 32.1 g/dL (ref 30.0–36.0)
MCV: 92.9 fL (ref 78.0–100.0)
Platelets: 281 10*3/uL (ref 150–400)
RBC: 2.68 MIL/uL — ABNORMAL LOW (ref 4.22–5.81)
RDW: 23.3 % — AB (ref 11.5–15.5)
WBC: 12.3 10*3/uL — ABNORMAL HIGH (ref 4.0–10.5)

## 2015-02-20 LAB — CALCIUM, IONIZED
Calcium, Ionized, Serum: 4.2 mg/dL — ABNORMAL LOW (ref 4.5–5.6)
Calcium, Ionized, Serum: 4.3 mg/dL — ABNORMAL LOW (ref 4.5–5.6)

## 2015-02-20 LAB — PHOSPHORUS: PHOSPHORUS: 5.5 mg/dL — AB (ref 2.5–4.6)

## 2015-02-20 MED ORDER — VASOPRESSIN 20 UNIT/ML IV SOLN
0.0300 [IU]/min | INTRAVENOUS | Status: DC
  Administered 2015-02-20: 0.03 [IU]/min via INTRAVENOUS
  Filled 2015-02-20 (×2): qty 2

## 2015-02-20 MED ORDER — POTASSIUM CHLORIDE 10 MEQ/50ML IV SOLN
10.0000 meq | INTRAVENOUS | Status: AC
  Administered 2015-02-20 (×6): 10 meq via INTRAVENOUS
  Filled 2015-02-20 (×6): qty 50

## 2015-02-20 MED ORDER — MAGNESIUM SULFATE IN D5W 10-5 MG/ML-% IV SOLN
1.0000 g | Freq: Once | INTRAVENOUS | Status: AC
  Administered 2015-02-20: 1 g via INTRAVENOUS
  Filled 2015-02-20: qty 100

## 2015-02-20 MED ORDER — FAT EMULSION 20 % IV EMUL
240.0000 mL | INTRAVENOUS | Status: AC
  Administered 2015-02-20: 240 mL via INTRAVENOUS
  Filled 2015-02-20: qty 250

## 2015-02-20 MED ORDER — POTASSIUM CHLORIDE 10 MEQ/50ML IV SOLN
10.0000 meq | INTRAVENOUS | Status: AC
  Administered 2015-02-20 (×3): 10 meq via INTRAVENOUS
  Filled 2015-02-20 (×3): qty 50

## 2015-02-20 MED ORDER — TRACE MINERALS CR-CU-MN-SE-ZN 10-1000-500-60 MCG/ML IV SOLN
INTRAVENOUS | Status: AC
  Administered 2015-02-20: 17:00:00 via INTRAVENOUS
  Filled 2015-02-20: qty 1920

## 2015-02-20 MED ORDER — ALBUMIN HUMAN 5 % IV SOLN
12.5000 g | Freq: Once | INTRAVENOUS | Status: AC
  Administered 2015-02-20: 12.5 g via INTRAVENOUS
  Filled 2015-02-20: qty 250

## 2015-02-20 MED ORDER — INSULIN GLARGINE 100 UNIT/ML ~~LOC~~ SOLN
20.0000 [IU] | Freq: Every day | SUBCUTANEOUS | Status: DC
  Administered 2015-02-21 – 2015-02-24 (×4): 20 [IU] via SUBCUTANEOUS
  Filled 2015-02-20 (×4): qty 0.2

## 2015-02-20 MED ORDER — INSULIN GLARGINE 100 UNIT/ML ~~LOC~~ SOLN
5.0000 [IU] | Freq: Once | SUBCUTANEOUS | Status: AC
  Administered 2015-02-20: 5 [IU] via SUBCUTANEOUS
  Filled 2015-02-20: qty 0.05

## 2015-02-20 NOTE — Progress Notes (Signed)
Admit: 01/19/2015 LOS: 32  34M AKI 2/2 ATN (req CRRT through 8/30 w/ septic shock infected GB fossa, VDRF  Subjective:  Remains on PE and VP Good UOP Stable SCr Has L IJ non tunneled HD cath  09/03 0701 - 09/04 0700 In: 3249.1 [I.V.:749.1; IV Piggyback:430; TPN:2070] Out: 2310 [Urine:1950; Emesis/NG output:350; Drains:10]  Filed Weights   02/18/15 0500 02/19/15 0500 02/20/15 0500  Weight: 113.9 kg (251 lb 1.7 oz) 113.5 kg (250 lb 3.6 oz) 109.6 kg (241 lb 10 oz)    Scheduled Meds: . sodium chloride   Intravenous Once  . anidulafungin  100 mg Intravenous Q24H  . antiseptic oral rinse  7 mL Mouth Rinse q12n4p  . chlorhexidine  15 mL Mouth Rinse BID  . darbepoetin (ARANESP) injection - NON-DIALYSIS  200 mcg Subcutaneous Q Wed-1800  . hydrocortisone sodium succinate  50 mg Intravenous Q6H  . insulin aspart  0-20 Units Subcutaneous 6 times per day  . insulin glargine  15 Units Subcutaneous Daily  . ipratropium  0.5 mg Nebulization Q6H  . levalbuterol  0.63 mg Nebulization Q6H  . nystatin   Topical TID  . pantoprazole (PROTONIX) IV  40 mg Intravenous Q24H  . piperacillin-tazobactam (ZOSYN)  IV  3.375 g Intravenous 3 times per day  . potassium chloride  10 mEq Intravenous Q1 Hr x 6  . sodium chloride  10-40 mL Intracatheter Q12H   Continuous Infusions: . sodium chloride 250 mL (02/20/15 0857)  . TPN (CLINIMIX) Adult without lytes 80 mL/hr at 02/19/15 1734   And  . fat emulsion 240 mL (02/19/15 1734)  . TPN (CLINIMIX) Adult without lytes     And  . fat emulsion    . phenylephrine (NEO-SYNEPHRINE) Adult infusion 15 mcg/min (02/20/15 1118)  . vasopressin (PITRESSIN) infusion - *FOR SHOCK* 0.03 Units/min (02/20/15 0907)   PRN Meds:.acetaminophen, fentaNYL (SUBLIMAZE) injection, iohexol, iohexol, ipratropium, levalbuterol, [DISCONTINUED] ondansetron **OR** ondansetron (ZOFRAN) IV, sodium chloride  Current Labs: reviewed    Physical Exam:  Blood pressure 107/55, pulse 101,  temperature 98.1 F (36.7 C), temperature source Oral, resp. rate 22, height  (1.676 m), weight 109.6 kg (241 lb 10 oz), SpO2 95 %. Frail, NAD NGT ijn place  RIJ HD Cath RRR Foley in place  A/P 1. AKI 2/2 ATN: stable, good UOP, Lytes ok.  No HD needs.  If further imiproved over next 24-48h need to remove HD cath -- Not yet ready9/4  Sabra Heck MD 02/20/2015, 12:15 PM   Recent Labs Lab 02/19/15 0504 02/19/15 1701 02/20/15 0450  NA 130* 136 136  136  K 3.3* 4.0 3.3*  3.3*  CL 95* 101 101  100*  CO2 GLUCOSE 315* 145* 217*  216*  BUN 100* 100* 103*  103*  CREATININE 3.73* 3.79* 3.63*  3.64*  CALCIUM 7.2* 7.5* 7.7*  7.7*  PHOS 5.7* 5.9* 5.5*  5.5*    Recent Labs Lab 02/14/15 0530  02/17/15 1800 02/19/15 0504 02/20/15 0450  WBC 25.1*  < > 10.5 9.5 12.3*  NEUTROABS 20.5*  --   --   --   --   HGB 7.6*  < > 7.4* 7.7* 8.0*  HCT 23.8*  < > 22.7* 24.3* 24.9*  MCV 94.4  < > 92.7 93.8 92.9  PLT 156  < > 189 244 281  < > = values in this interval not displayed.

## 2015-02-20 NOTE — Progress Notes (Signed)
PULMONARY / CRITICAL CARE MEDICINE   Name: Larry [name] MRN: [id number] DOB: 1945-09-28    ADMISSION DATE:  01/19/2015 CONSULTATION DATE:  8/4  REFERRING MD :  [name]  CHIEF COMPLAINT:  Septic shock   INITIAL PRESENTATION:  69 year old male w/ CAF on Elliquis who recently underwent lap chole on 7/14. Was discharged home w/drain. Drain removed about a week before presentation to ER. Admitted directly from [facility] on 8/3 w/ septic shock which was likely due to infected hematoma in the GB fossa. PCCM asked to see after new perc drain placed on 8/4, when he remained hypotensive.  Required CRRT 8/23 for AKI  Intubated 8/26 for desaturations, significant agitation, delirium  STUDIES:  CT abdomen/Pelvis W/ 7/28 - Gas & fluid at cholecystectomy site w/o discrete abscess. Min ascites. Small right pleural eff. CT abdomen/pelvis w/o 8/5 - Gas between liver & stomach. No additional free air. Increased flow void & high density layering c/w increasing hemorrhage as well as increased right pleural eff. CT abdomen/pelvis 8/18 >> 9.8 cm hematoma with gas in the gallbladder fossa, indwelling pigtail drainage catheter, 12.6 cm hematoma along the posterior R liver, mod ascites, mild wall thickening involving the R colon CT abdomen/pelvis 8/22 >> stable perihepatic hematoma, stable gallbladder fossa collection despite adequate drainage cath placement, significant free fluid within the abdomen/pelvis slightly increased from prior exam 8/31 CT abd >>persistent large fluid collection at the gallbladder fossa 11.7 x 9.0 x 8.5 cm previously 9.7 x 9.7 x 8.8 cm. Additional subhepatic collection anteromedial to gallbladder collection containing air and fluid, decreased in size now measuring 5.0 x 5.0 x 4.4 cm, Significant ascites  SIGNIFICANT EVENTS: Admitted 8/3 w/ abd pain and hypotension  8/04  hypotensive over-night. Went for [facility] drain of GB fossa. Found what looked like old infected hematoma. PCCM  asked to see post-op for shock 8/05  off pressors. WOB worse.  8/06  WOB & wheezing slightly worse. Progressing abdominal pain 8/07  Hgb declining w/ increasing intraperitoneal hemorrhage 8/07  Transfused 2u PRBCs 8/09  work of breathing still significant w/ marked upper airway component  8/10  breathing better. 1.6 liters negative. Slight bump in scr. Vanc stopped. Getting OOB for first time 8/11  eating regular diet. Up in chair. Feeling better.  Respiratory status improved.  PCCM s/o 8/14  Renal consulted for rising sr cr (to 3.60), baseline sr cr 1.1-1.5 (prior IV contrast 7/28).   8/14  Concern for yeast / bacterial UTI  8/15  ID consulted > abx narrowed to zosyn only, fluconazole for yeast  8/16  UOP increased on IV lasix 8/18  NPO, sr cr improving, CT abd as above  8/18  GB drain upsized per IR to 16 Fr 8/19  Abd pain, eating well.  Diarrhea after ensure.  To PO lasix.  Discussed gastrostomy tube with IR 8/22  Sr Cr rising, intermittent vomiting.  CT abd as above.  Increased confusion 8/23  HD cath inserted, CRRT initiated.  Gastrostomy tube held off due to CRRT & Hgb drop 8/24  WBC elevated, cultures repeated.   8/24  Paracentesis >> 150 ml "bloody bile" fluid removed, loculated and difficult to aspirate 8/25  Palliative care consulted, PCCM consulted.  Vomiting with NGT insertion.  8/26  Intubated early am(0200) for desaturations, significant agitation, delirium  8/30 CRRT stopped, bilious vomiting/secretions 8/31 Paracentesis >> 3.1 L 9/1 1 U PRBC  SUBJECTIVE:   Remains on neo still, hungry  VITAL SIGNS: Temp:  [  97.6 F (36.4 C)-98.7 F (37.1 C)] 97.8 F (36.6 C) (09/04 0400) Pulse Rate:  [34-127] 93 (09/04 0630) Resp:  [15-33] 26 (09/04 0630) BP: (73-125)/(29-69) 104/53 mmHg (09/04 0630) SpO2:  [92 %-100 %] 96 % (09/04 0630) Weight:  [109.6 kg (241 lb 10 oz)] 109.6 kg (241 lb 10 oz) (09/04 0500)  HEMODYNAMICS:     VENTILATOR SETTINGS:     INTAKE /  OUTPUT:  Intake/Output Summary (Last 24 hours) at 02/20/15 4098 Last data filed at 02/20/15 1191  Gross per 24 hour  Intake 3124.06 ml  Output   2310 ml  Net 814.06 ml    PHYSICAL EXAMINATION: General:  Acutely ill adult male , no distress Neuro:  Interactive, nonfocal, int confusion, perr HEENT:  HD cath site clean Cardiovascular:  s1s2 rrr, no m/r/g Lungs:  scattered ronchi  Abdomen: Hypoactive BS. Non- tender, drain in place Musculoskeletal:  No rash, joint inflammation Skin:  Normal edema legs  LABS:  CBC  Recent Labs Lab 02/17/15 1800 02/19/15 0504 02/20/15 0450  WBC 10.5 9.5 12.3*  HGB 7.4* 7.7* 8.0*  HCT 22.7* 24.3* 24.9*  PLT 189 244 281   Coag's  Recent Labs Lab 02/18/15 0453 02/19/15 0504 02/20/15 0450  APTT 31 33 37   BMET  Recent Labs Lab 02/19/15 0504 02/19/15 1701 02/20/15 0450  NA 130* 136 136  136  K 3.3* 4.0 3.3*  3.3*  CL 95* 101 101  100*  CO2 25 25 25  25   BUN 100* 100* 103*  103*  CREATININE 3.73* 3.79* 3.63*  3.64*  GLUCOSE 315* 145* 217*  216*   Electrolytes  Recent Labs Lab 02/18/15 0453  02/19/15 0504 02/19/15 1701 02/20/15 0450  CALCIUM 7.8*  < > 7.2* 7.5* 7.7*  7.7*  MG 1.9  --  1.8  --  1.8  PHOS 6.1*  < > 5.7* 5.9* 5.5*  5.5*  < > = values in this interval not displayed. Sepsis Markers No results for input(s): LATICACIDVEN, PROCALCITON, O2SATVEN in the last 168 hours.   ABG  Recent Labs Lab 02/13/15 1300 02/14/15 0842  PHART 7.384 7.420  PCO2ART 56.4* 52.3*  PO2ART 72.2* 73.7*   Liver Enzymes  Recent Labs Lab 02/14/15 0530  02/17/15 0500  02/19/15 0504 02/19/15 1701 02/20/15 0450  AST 55*  --  47*  --   --   --   --   ALT 33  --  26  --   --   --   --   ALKPHOS 100  --  136*  --   --   --   --   BILITOT 2.0*  --  1.7*  --   --   --   --   ALBUMIN 1.9*  1.9*  < > 1.8*  < > 1.7* 1.6* 1.7*  < > = values in this interval not displayed.   Cardiac Enzymes No results for input(s):  TROPONINI, PROBNP in the last 168 hours.   Glucose  Recent Labs Lab 02/19/15 0814 02/19/15 1301 02/19/15 1652 02/19/15 2035 02/20/15 0024 02/20/15 0411  GLUCAP 90 97 130* 166* 218* 201*    Imaging No results found.  ASSESSMENT / PLAN:  PULMONARY A: ETT 8/26 >> 8/29 Acute Hypoxic Respiratory Failure - in the setting of significant Agitation, renal fx, delirium  - resolved Chronically elevated right HD Right pleural effusion - progessing on Abd CT 8/6 P:   Xopenex neb q6hr PRN- tolerates IS q 1h when awake Need  pt again  CARDIOVASCULAR CVL Right IJ 8/4 >> 8/22 RUE PICC 8/22 >>  A:  Severe sepsis/septic shock - remains Hypotension - 8/26, started on neo post intubation, smouldering sepsis Hx PAF - CHADVASC score 4; currently NSR  Prior NSTEMI  Adrenal insuff P:  Neo gtt  for MAP > 60, may need to lower his map goal to 55 with good MS keep stress steroids as shock remains Continued use neo, add back vasopressin consider challenge albumin x 1, in setting ascites cvp stopped No role florinef with renal failure  RENAL A:   AKI- in setting of septic shock, prior hypotension/ATN Non oliguric now Hypo k  P:   supp k  tpn Chem in am  Keep HD cath, with crt levels noted cvp dc'ed  GASTROINTESTINAL A:   Infected hematoma s/p cholecystectomy - s/p CT guided Perc drain with subsequent upsize to 16 Fr, Paracentesis 8/31 c/w peritonitis GERD - worse 8/5 w/ associated UAW wheeze Intra-abdominal hemorrhage / hematoma  Nausea / Vomiting - concern for ileus (8/25) Protein Calorie Malnutrition   P:   TNA per pharmacy  Protonix QD NPO reglan dc will prolong qtc and have nothing by mouth now  HEMATOLOGIC A:   Anemia - in setting of intra-abdominal hemorrhage (8/4).  S/p 2 u PRBC on 8/4,2u 8/7  , 1 U 9/1 P:  Limit phlebotomy when able SCDs Transfuse for Hbg < 7, not required  INFECTIOUS A:   Severe sepsis/septic shock - presume source is infected hematoma  from recent Cholecystectomy. Shock resolved.  S/p Perc GB fossa site drain 8/4 - minimal serous output High Risk for Fungemia P:   ID following, appreciate input   Abscess 8/4>>>neg BCX2 8/4>>>neg  Peritoneal fluid GS 8/24 >> abundant WBC, no organisms seen Peritoneal fluid culture 8/24 >>  BCx2 8/25 >> ng Urine 8/14 >> yeast  Ascitic fluid 8/31 >ng c diff neg 9/1  Meropenem 8/24 >>  Fluconazole 8/15 >> 8/23 anigulofungin 8/26>>> vanc 8/28>>>8/31  Per ID  ENDOCRINE A: DM - CBG's better  Controlled, actually low improved AI P:   SSI Q4 decrease Lantus further at 15 done 9/3, no changes insulin in TNA  NEUROLOGIC A:  Delirium  Critical Illness Deconditioning  P:   RASS goal: 0 minimize sedating medications, promote sleep / wake cycle  Fentanyl prn PT i will re order this, may have fallen off   FAMILY  - Updates:  D-in law 8/30  - Inter-disciplinary family meet or Palliative Care meeting due by:  Ongoing, family wants to continue full aggressive measures.  Hopeful that patient will be able to recover.    Ccm time 30 min   Mcarthur Rossetti. Tyson Alias, MD, FACP Pgr: [phone] [facility]

## 2015-02-20 NOTE — Progress Notes (Signed)
PARENTERAL NUTRITION CONSULT NOTE   Pharmacy Consult for TPN Indication: Prolonged ileus  No Known Allergies  Patient Measurements: Height: 5\' 6"  (167.6 cm) Weight: 241 lb 10 oz (109.6 kg) IBW/kg (Calculated) : 63.8 Adjusted Body Weight: 75.3 kg, recalculated 9/4 Usual Weight:   Vital Signs: Temp: 97.8 F (36.6 C) (09/04 0400) Temp Source: Oral (09/04 0400) BP: 104/53 mmHg (09/04 0630) Pulse Rate: 93 (09/04 0630) Intake/Output from previous day: 09/03 0701 - 09/04 0700 In: 3249.1 [I.V.:749.1; IV Piggyback:430; TPN:2070] Out: 2310 [Urine:1950; Emesis/NG output:350; Drains:10] Intake/Output from this shift:    Labs:  Recent Labs  02/17/15 1800 02/18/15 0453 02/19/15 0504 02/20/15 0450  WBC 10.5  --  9.5 12.3*  HGB 7.4*  --  7.7* 8.0*  HCT 22.7*  --  24.3* 24.9*  PLT 189  --  244 281  APTT  --  31 33 37     Recent Labs  02/18/15 0453  02/19/15 0504 02/19/15 1701 02/20/15 0450  NA 138  < > 130* 136 136  136  K 3.9  < > 3.3* 4.0 3.3*  3.3*  CL 97*  < > 95* 101 101  100*  CO2 29  < > 25 25 25  25   GLUCOSE 88  < > 315* 145* 217*  216*  BUN 97*  < > 100* 100* 103*  103*  CREATININE 4.04*  < > 3.73* 3.79* 3.63*  3.64*  CALCIUM 7.8*  < > 7.2* 7.5* 7.7*  7.7*  MG 1.9  --  1.8  --  1.8  PHOS 6.1*  < > 5.7* 5.9* 5.5*  5.5*  ALBUMIN 1.8*  < > 1.7* 1.6* 1.7*  < > = values in this interval not displayed. Estimated Creatinine Clearance: 22.6 mL/min (by C-G formula based on Cr of 3.64).    Recent Labs  02/19/15 2035 02/20/15 0024 02/20/15 0411  GLUCAP 166* 218* 201*    Insulin Requirements: 21 units of Novolog SSI + Lantus 15 units + 25 units regular insulin provided by TPN  Current Nutrition: NPO, unable to tolerate TF  IVF: NS KVO  Central access: PICC  TPN start date:  8/22  ASSESSMENT                                                                                                          HPI: 61 YOM presents with worsening abdominal pain on  8/3.  Recent cholecystectomy on 01/04/2015 with drain removed 2 weeks prior to admission. Found to have GB fossa fluid collection (infected hematoma) and abscess. IR placed GB fossa drain.  Pharmacy asked to start TPN 8/22 for prolonged ileus and interment vomiting prohibiting use of enteral nutrition.    Significant events:  8/3 septic shock from infected GB fossa 8/22 orders to start TPN 8/23 Start CRRT 8/25 intubated 8/29 extubated 8/30 CRRT stopped 9/2: Reglan restarted.  Having multiple BMs.  9/3-9/4: NG output still high.  Holding on clamping trials for now.   Today, 02/20/2015:    Glucose - uncontrolled on Lantus qhs + SSI +  insulin in TPN. Steroids tapered off then restarted 9/4.  Patient on Lantus 40 units daily PTA. Lantus and TPN insulin reduced 9/3.  CBGs and serum glucose now slightly over goal (145-217)  Electrolytes:  Na WNL   K+ trending down, will replete again  Mg WNL, trending down  Phos elevated, slowly improving.  Electrolytes removed from TPN.  Protein supplementation at ~1.3 g/kg/day (adjusted body weight)  CorrCa WNL. CaPhos product 52.5.  Renal - nonoliguric AKI. CRRT 8/23-8/30.   24h I/O = +522mL  SCr a little better, but BUN remains the same, CRRT stopped 8/30 AM, hoping for renal recovery  Urine output documented as 0.7 mL/kg/hr yesterday, continues to make good urine today.  LFTs - AST/ALT, Alk Phos ok, tbili 1.7  TGs - 155 (8/23), 175 (8/29)  Prealbumin - 5.3 (8/22), 10 (8/29)  NUTRITIONAL GOALS                                                                                             Updated RD recs 8/31 (now that off CRRT): 1950-2150 kcals, protein 95-105 g (fluid 2L/day) Clinimix 5/15 at a goal rate of 57ml/hr + 20% fat emulsion at 42ml/hr to provide: 96g/day protein, 1843Kcal/day.  PLAN                                                                                                             At 1800 today:  Continue Clinimix 5/15  (WITHOUT lytes) at 80 ml/hr.  Patient is off of CRRT, watching for recovery.  Protein supplementation at ~1.3 g/kg/day of adjusted body weight.  Concerned that reducing protein supplementation further would not benefit this acutely ill patient.  BUN may rise regardless of exogenous protein supplementation in this catabolic patient; however, if BUN continues to increase, may consider reducing protein provided by TPN.   Continue regular insulin in TPN: Increase to 30 units of regular insulin over 24 hour infusion.  Would prefer to add insulin to TPN rather than increase Lantus dose in case TPN is abruptly stopped. Stress dose steroids tapered off 9/1 then resumed 9/3.   Continue 20% lipid emulsion at 10 ml/hr.  Held lipids for first week of ICU admission.  Resumed 8/30.   TPN to contain standard multivitamins and trace elements.  Continue to provide all trace elements for now.  Will need to consider removing chromium and selenium if SCr does not show improvement over next several days and remains on TPN.  Monitor electrolytes closely.  Consider adding electrolytes back to TPN once phos WNL if all other lytes still trending down.   Potassium chloride 39mEq q1h x 6  Check renal panel this afternoon and  replete again as needed  CBG monitoring, SSI q4h  Daily renal function panel, Mg and Phos ordered per MD   F/u daily.    Ralene Bathe, PharmD, BCPS 02/20/2015, 7:18 AM  Pager: [phone]

## 2015-02-20 NOTE — Progress Notes (Signed)
ANTIBIOTIC CONSULT NOTE  Pharmacy Consult for Zosyn Indication: infected hematoma s/p cholecystectomy/IAI  No Known Allergies  Patient Measurements: Height: 5\' 6"  (167.6 cm) Weight: 241 lb 10 oz (109.6 kg) IBW/kg (Calculated) : 63.8   Vital Signs: Temp: 97.8 F (36.6 C) (09/04 0400) Temp Source: Oral (09/04 0400) BP: 104/53 mmHg (09/04 0630) Pulse Rate: 93 (09/04 0630) Intake/Output from previous day: 09/03 0701 - 09/04 0700 In: 3249.1 [I.V.:749.1; IV Piggyback:430; TPN:2070] Out: 2310 [Urine:1950; Emesis/NG output:350; Drains:10] Intake/Output from this shift:    Labs:  Recent Labs  02/17/15 1800  02/19/15 0504 02/19/15 1701 02/20/15 0450  WBC 10.5  --  9.5  --  12.3*  HGB 7.4*  --  7.7*  --  8.0*  PLT 189  --  244  --  281  CREATININE  --   < > 3.73* 3.79* 3.63*  3.64*  < > = values in this interval not displayed. Estimated Creatinine Clearance: 22.6 mL/min (by C-G formula based on Cr of 3.64).  Recent Labs  02/17/15 1220  VANCORANDOM 15     Microbiology: Recent Results (from the past 720 hour(s))  Urine culture     Status: None   Collection Time: 01/30/15 11:40 AM  Result Value Ref Range Status   Specimen Description URINE, RANDOM  Final   Special Requests NONE  Final   Culture   Final    >=100,000 COLONIES/mL YEAST Performed at [facility]    Report Status 02/01/2015 FINAL  Final  Culture, body fluid-bottle     Status: None   Collection Time: 02/09/15  2:10 PM  Result Value Ref Range Status   Specimen Description FLUID PERITONEAL  Final   Special Requests BOTTLES DRAWN AEROBIC AND ANAEROBIC 10CC  Final   Culture   Final    NO GROWTH 5 DAYS Performed at [facility]    Report Status 02/14/2015 FINAL  Final  Gram stain     Status: None   Collection Time: 02/09/15  2:10 PM  Result Value Ref Range Status   Specimen Description FLUID PERITONEAL  Final   Special Requests BOTTLES DRAWN AEROBIC AND ANAEROBIC 10CC  Final   Gram Stain    Final    ABUNDANT WBC PRESENT,BOTH PMN AND MONONUCLEAR NO ORGANISMS SEEN Performed at [facility]    Report Status 02/09/2015 FINAL  Final  Culture, blood (routine x 2)     Status: None   Collection Time: 02/10/15  4:00 PM  Result Value Ref Range Status   Specimen Description BLOOD LEFT ANTECUBITAL  Final   Special Requests BOTTLES DRAWN AEROBIC ONLY 7CC  Final   Culture   Final    NO GROWTH 5 DAYS Performed at [facility]    Report Status 02/15/2015 FINAL  Final  Culture, blood (routine x 2)     Status: None   Collection Time: 02/10/15  4:15 PM  Result Value Ref Range Status   Specimen Description BLOOD LEFT FOREARM  Final   Special Requests BOTTLES DRAWN AEROBIC ONLY 5CC  Final   Culture   Final    NO GROWTH 5 DAYS Performed at [facility]    Report Status 02/15/2015 FINAL  Final  Culture, body fluid-bottle     Status: None (Preliminary result)   Collection Time: 02/16/15  3:56 PM  Result Value Ref Range Status   Specimen Description FLUID PERITONEAL  Final   Special Requests NONE  Final   Culture   Final  NO GROWTH 3 DAYS Performed at [facility]    Report Status PENDING  Incomplete  Gram stain     Status: None   Collection Time: 02/16/15  3:56 PM  Result Value Ref Range Status   Specimen Description FLUID PERITONEAL  Final   Special Requests NONE  Final   Gram Stain   Final    ABUNDANT WBC PRESENT,BOTH PMN AND MONONUCLEAR NO ORGANISMS SEEN Performed at [facility]    Report Status 02/16/2015 FINAL  Final  C difficile quick scan w PCR reflex     Status: None   Collection Time: 02/17/15 12:00 PM  Result Value Ref Range Status   C Diff antigen NEGATIVE NEGATIVE Final   C Diff toxin NEGATIVE NEGATIVE Final   C Diff interpretation Negative for toxigenic C. difficile  Final   Assessment: 27 yoM presents with increased abdominal pain since cholecystectomy last month. Patient was discharged with a drain in place which was  removed last week.  Patient transferred from [facility] on 8/3 with septic shock likely due to infected hematoma in the GB fossa. Antibiotics started for intra-abdominal infection, possible UTI.   8/3 >> Vanc >>  8/10 8/3 >> Zosyn >> 8/24; 8/31 8/17 >> Diflucan >> 8/23 8/24 >> Meropenem >>8/31 8/25 >> Anidulafungin >> 8/28 >> Vancomycin >> 9/2 8/31 >> Zosyn >>  8/4 blood x 2: neg FINAL 8/4 GB fossa abscess: multiple organisms present, none predominant. No Staph aureus or Group A strep (S. Pyogenes) isolated 8/4 abscess (anaerobic): neg FINAL 8/4 MRSA PCR: negative 8/14 urine: yeast-final 8/24 peritoneal fluid: no growth, final 8/25 blood x 2: no growth, final 8/31 peritoneal fluid cx: ngtd 9/1 CDiff antigen and toxin negativ  Today, 02/20/2015 - Day 33 antibiotics - Tmax24h: Afebrile - WBC: elevated, hydrocortisone tapered off 9/1, resumed 9/3 - Renal: CRRT stopped 8/30 AM. SCr slowly improving.  Good UOP. Marland Kitchen   GOALS Doses adjusted per renal function Eradication of infection  Plan:  - Continue Zosyn 3.375g IV q8h (4 hour infusion time).  - Continue anidulafungin 100 mg IV q24h - Continue to monitor SCr daily and reassess abx dosing.   - F/u ID recommendations  Haynes Hoehn, PharmD, BCPS 02/20/2015, 7:39 AM  Pager: [phone]

## 2015-02-20 NOTE — Progress Notes (Signed)
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 Was discharged home w/drain. Drain removed about a week before presentation to ER. Admitted directly from [facility] on 8/3 w/ septic shock which was likely due to infected hematoma in the GB fossa.  new perc drain placed on 8/4, when he remained hypotensive. Required HD  but now off with improving kidney function. On ventilator 8/26-8/29.    PT Comments    Patient was focussed  on getting onto [facility]. Utilized Corene Cornea lift equipment which allows patient to actively participate in standing but is safer as patient fatigues easily. Patient is doing very well considering his LOS and medical issues.Hope to ambulate next visit.  Follow Up Recommendations  SNF     Equipment Recommendations  Rolling walker with 5" wheels    Recommendations for Other Services       Precautions / Restrictions Precautions Precautions: Fall Precaution Comments: JP drain on R, monitor HR, multiple lines    Mobility  Bed Mobility         Supine to sit: Mod assist     General bed mobility comments: patient got  his legs to and over edge, assist  by PT for trunk upright. scoots to edge with min guard.  Transfers Overall transfer level: Needs assistance Equipment used:  Corene Cornea) Transfers: Sit to/from Stand Sit to Stand: +2 safety/equipment;Min assist         General transfer comment: stood  at [place]  x 4 for transfer to [facility] then recliner. patient able to pull himself up to stand, fatigues quickly..  Ambulation/Gait                 Stairs            Wheelchair Mobility    Modified Rankin (Stroke Patients Only)       Balance Overall balance assessment: Needs assistance Sitting-balance support: Feet supported;Bilateral upper extremity  supported Sitting balance-Leahy Scale: Fair                              Cognition Arousal/Alertness: Awake/alert Behavior During Therapy: WFL for tasks assessed/performed Overall Cognitive Status: Within Functional Limits for tasks assessed Area of Impairment: Safety/judgement                    Exercises      General Comments        Pertinent Vitals/Pain Pain Assessment: Faces Faces Pain Scale: Hurts even more Pain Location: site of R drain Pain Descriptors / Indicators: Tender Pain Intervention(s): Monitored during session;Repositioned    Home Living                      Prior Function            PT Goals (current goals can now be found in the care plan section) Progress towards PT goals: Progressing toward goals    Frequency  Min 3X/week    PT Plan Current plan remains appropriate    Co-evaluation             End of Session   Activity Tolerance: Patient tolerated treatment well Patient left: in chair;with call bell/phone within reach     Time: 1150-1241 PT Time Calculation (min) (ACUTE  ONLY): 51 min  Charges:  $Therapeutic Activity: 38-52 mins                    G Codes:      Rada Hay 02/20/2015, 1:49 PM Blanchard Kelch PT [phone]

## 2015-02-20 NOTE — Progress Notes (Signed)
Patient ID: Larry [name], male   DOB: Feb 24, 1946, 69 y.o.   MRN: [id number]  General Surgery [facility]  Subjective: Patient awake, working with IS.  Wants to use bedside commode for BM's.  Objective: Vital signs in last 24 hours: Temp:  [97.6 F (36.4 C)-98.7 F (37.1 C)] 97.8 F (36.6 C) (09/04 0400) Pulse Rate:  [34-127] 93 (09/04 0630) Resp:  [15-33] 26 (09/04 0630) BP: (73-125)/(29-69) 104/53 mmHg (09/04 0630) SpO2:  [92 %-100 %] 96 % (09/04 0630) Weight:  [109.6 kg (241 lb 10 oz)] 109.6 kg (241 lb 10 oz) (09/04 0500) Last BM Date: 02/19/15  Intake/Output from previous day: 09/03 0701 - 09/04 0700 In: 3249.1 [I.V.:749.1; IV Piggyback:430; TPN:2070] Out: 2310 [Urine:1950; Emesis/NG output:350; Drains:10] Intake/Output this shift:    Physical Exam: HEENT - sclerae clear, mucous membranes moist Neck - soft Chest - clear bilaterally Cor - RRR Abdomen - soft, obese; quiet; RUQ drain with dark reddish brown consistent with resolving hematoma; non-tender  Lab Results:   Recent Labs  02/19/15 0504 02/20/15 0450  WBC 9.5 12.3*  HGB 7.7* 8.0*  HCT 24.3* 24.9*  PLT 244 281   BMET  Recent Labs  02/19/15 1701 02/20/15 0450  NA 136 136  136  K 4.0 3.3*  3.3*  CL 101 101  100*  CO2 25 25  25   GLUCOSE 145* 217*  216*  BUN 100* 103*  103*  CREATININE 3.79* 3.63*  3.64*  CALCIUM 7.5* 7.7*  7.7*   PT/INR No results for input(s): LABPROT, INR in the last 72 hours. Comprehensive Metabolic Panel:    Component Value Date/Time   NA 136 02/20/2015 0450   NA 136 02/20/2015 0450   K 3.3* 02/20/2015 0450   K 3.3* 02/20/2015 0450   CL 100* 02/20/2015 0450   CL 101 02/20/2015 0450   CO2 25 02/20/2015 0450   CO2 25 02/20/2015 0450   BUN 103* 02/20/2015 0450   BUN 103* 02/20/2015 0450   CREATININE 3.64* 02/20/2015 0450   CREATININE 3.63* 02/20/2015 0450   GLUCOSE 216* 02/20/2015 0450   GLUCOSE 217* 02/20/2015 0450   CALCIUM 7.7*  02/20/2015 0450   CALCIUM 7.7* 02/20/2015 0450   AST 47* 02/17/2015 0500   AST 55* 02/14/2015 0530   ALT 26 02/17/2015 0500   ALT 33 02/14/2015 0530   ALKPHOS 136* 02/17/2015 0500   ALKPHOS 100 02/14/2015 0530   BILITOT 1.7* 02/17/2015 0500   BILITOT 2.0* 02/14/2015 0530   PROT 5.7* 02/17/2015 0500   PROT 6.5 02/14/2015 0530   ALBUMIN 1.7* 02/20/2015 0450   ALBUMIN 1.6* 02/19/2015 1701    Studies/Results: No results found.  Anti-infectives: Anti-infectives    Start     Dose/Rate Route Frequency Ordered Stop   02/17/15 1400  vancomycin (VANCOCIN) IVPB 1000 mg/200 mL premix     1,000 mg 200 mL/hr over 60 Minutes Intravenous  Once 02/17/15 1320 02/17/15 1502   02/16/15 2200  piperacillin-tazobactam (ZOSYN) IVPB 3.375 g     3.375 g 12.5 mL/hr over 240 Minutes Intravenous 3 times per day 02/16/15 1603     02/15/15 2200  meropenem (MERREM) 500 mg in sodium chloride 0.9 % 50 mL IVPB  Status:  Discontinued     500 mg 100 mL/hr over 30 Minutes Intravenous Every 12 hours 02/15/15 1415 02/16/15 1533   02/14/15 1200  vancomycin (VANCOCIN) IVPB 1000 mg/200 mL premix  Status:  Discontinued     1,000 mg 200 mL/hr over  60 Minutes Intravenous Every 24 hours 02/13/15 0920 02/16/15 0706   02/13/15 1000  vancomycin (VANCOCIN) 1,750 mg in sodium chloride 0.9 % 500 mL IVPB     1,750 mg 250 mL/hr over 120 Minutes Intravenous  Once 02/13/15 0836 02/13/15 1210   02/12/15 1630  meropenem (MERREM) 500 mg in sodium chloride 0.9 % 50 mL IVPB  Status:  Discontinued     500 mg 100 mL/hr over 30 Minutes Intravenous Every 6 hours 02/12/15 1106 02/15/15 1415   02/11/15 1800  anidulafungin (ERAXIS) 100 mg in sodium chloride 0.9 % 100 mL IVPB     100 mg over 90 Minutes Intravenous Every 24 hours 02/10/15 1744     02/10/15 1730  anidulafungin (ERAXIS) 200 mg in sodium chloride 0.9 % 200 mL IVPB     200 mg over 180 Minutes Intravenous  Once 02/10/15 1720 02/10/15 2230   02/10/15 0200  meropenem (MERREM) 500  mg in sodium chloride 0.9 % 50 mL IVPB  Status:  Discontinued     500 mg 100 mL/hr over 30 Minutes Intravenous Every 8 hours 02/09/15 1808 02/12/15 1106   02/10/15 0000  meropenem (MERREM) 500 mg in sodium chloride 0.9 % 50 mL IVPB  Status:  Discontinued     500 mg 100 mL/hr over 30 Minutes Intravenous 4 times per day 02/09/15 1655 02/09/15 1808   02/09/15 1800  meropenem (MERREM) 1 g in sodium chloride 0.9 % 100 mL IVPB     1 g 200 mL/hr over 30 Minutes Intravenous  Once 02/09/15 1655 02/09/15 1818   02/08/15 1800  piperacillin-tazobactam (ZOSYN) IVPB 3.375 g  Status:  Discontinued     3.375 g 100 mL/hr over 30 Minutes Intravenous 4 times per day 02/08/15 1403 02/09/15 1655   02/07/15 1400  piperacillin-tazobactam (ZOSYN) IVPB 2.25 g  Status:  Discontinued     2.25 g 100 mL/hr over 30 Minutes Intravenous 3 times per day 02/07/15 0750 02/08/15 1403   02/04/15 1200  piperacillin-tazobactam (ZOSYN) IVPB 2.25 g  Status:  Discontinued     2.25 g 100 mL/hr over 30 Minutes Intravenous 4 times per day 02/04/15 0859 02/04/15 0905   02/04/15 1200  piperacillin-tazobactam (ZOSYN) IVPB 3.375 g  Status:  Discontinued     3.375 g 12.5 mL/hr over 240 Minutes Intravenous Every 8 hours 02/04/15 0905 02/07/15 0750   02/02/15 2000  fluconazole (DIFLUCAN) tablet 100 mg     100 mg Oral Every 24 hours 02/02/15 1908 02/08/15 2041   02/02/15 1800  fluconazole (DIFLUCAN) tablet 100 mg  Status:  Discontinued     100 mg Oral Daily 02/02/15 1545 02/02/15 1901   01/31/15 1000  fluconazole (DIFLUCAN) tablet 100 mg  Status:  Discontinued     100 mg Oral Daily 01/31/15 0727 02/02/15 1313   01/29/15 1700  piperacillin-tazobactam (ZOSYN) IVPB 2.25 g  Status:  Discontinued     2.25 g 100 mL/hr over 30 Minutes Intravenous Every 8 hours 01/29/15 0845 02/04/15 0859   01/24/15 2200  vancomycin (VANCOCIN) IVPB 1000 mg/200 mL premix     1,000 mg 200 mL/hr over 60 Minutes Intravenous  Once 01/24/15 1349 01/24/15 2301    01/21/15 2000  vancomycin (VANCOCIN) 1,500 mg in sodium chloride 0.9 % 500 mL IVPB  Status:  Discontinued     1,500 mg 250 mL/hr over 120 Minutes Intravenous Every 24 hours 01/21/15 1048 01/23/15 1726   01/20/15 2000  vancomycin (VANCOCIN) 1,250 mg in sodium chloride 0.9 % 250 mL  IVPB  Status:  Discontinued     1,250 mg 166.7 mL/hr over 90 Minutes Intravenous Every 24 hours 01/19/15 1945 01/20/15 0843   01/20/15 2000  vancomycin (VANCOCIN) 1,250 mg in sodium chloride 0.9 % 250 mL IVPB  Status:  Discontinued     1,250 mg 166.7 mL/hr over 90 Minutes Intravenous Every 24 hours 01/20/15 1519 01/21/15 1048   01/20/15 0000  piperacillin-tazobactam (ZOSYN) IVPB 3.375 g  Status:  Discontinued     3.375 g 12.5 mL/hr over 240 Minutes Intravenous Every 8 hours 01/19/15 1946 01/29/15 0845   01/19/15 1715  vancomycin (VANCOCIN) 2,500 mg in sodium chloride 0.9 % 500 mL IVPB     2,500 mg 250 mL/hr over 120 Minutes Intravenous  Once 01/19/15 1707 01/19/15 2159   01/19/15 1715  piperacillin-tazobactam (ZOSYN) IVPB 3.375 g     3.375 g 100 mL/hr over 30 Minutes Intravenous  Once 01/19/15 1707 01/19/15 1900      Assessment & Plans: Status post interval lap chole 7/18 - complicated by postop hematoma/sepsis Readmitted 8/4 for sepsis, aki on ckd, acute on chronic anemia  ID - On Zosyn and Eraxis. Per ID recommendations.  Renal - BUN/Cr elevated, making good urine. Defer to renal.  GI - having multiple loose BMs. On reglan. Cont TPN. NG output remains elevated, monitor, do not clamp NG tube yet.  Heme - low Hgb. Would not restart systemic IV heparin for PAF. SCDs only for DVT prophylaxis.  PT/OT - out of bed to chair, ambulate with assist.  Velora Heckler, MD, [facility] Office: [phone]   Kylani Wires Judie Petit 02/20/2015

## 2015-02-20 NOTE — Progress Notes (Signed)
eLink Physician-Brief Progress Note Patient Name: Larry [name] DOB: Dec 01, 1945 MRN: [id number]   Date of Service  02/20/2015  HPI/Events of Note  Frequent PVC's. K+ = 3.5 and Creatinine = 3.46.  eICU Interventions  Will order: 1. Replete K+. 2. Check Mg++ level now.  3. Recheck BMP at 11 PM.     Intervention Category Major Interventions: Arrhythmia - evaluation and management Intermediate Interventions: Electrolyte abnormality - evaluation and management  [name] 02/20/2015, 6:32 PM

## 2015-02-20 NOTE — Progress Notes (Signed)
eLink Physician-Brief Progress Note Patient Name: Larry [name] DOB: 1945-07-23 MRN: [id number]   Date of Service  02/20/2015  HPI/Events of Note  Multiple issues: 1. Mg++ = 1.8 and Creatinine = 3.46 and 2. Blood glucose in 200+ range most of day.   eICU Interventions  Will order: 1. Cautiously replete Mg++.  2. Increase Lantus to 20 units Q AM. 3. Lantus extra 5 units now X 1.     Intervention Category Intermediate Interventions: Electrolyte abnormality - evaluation and management;Hyperglycemia - evaluation and treatment  Lenell Antu 02/20/2015, 8:36 PM

## 2015-02-21 ENCOUNTER — Other Ambulatory Visit: Payer: Self-pay

## 2015-02-21 ENCOUNTER — Inpatient Hospital Stay (HOSPITAL_COMMUNITY): Payer: Medicare Other

## 2015-02-21 LAB — CBC
HEMATOCRIT: 21.5 % — AB (ref 39.0–52.0)
HEMOGLOBIN: 6.8 g/dL — AB (ref 13.0–17.0)
MCH: 29.6 pg (ref 26.0–34.0)
MCHC: 31.6 g/dL (ref 30.0–36.0)
MCV: 93.5 fL (ref 78.0–100.0)
Platelets: 298 10*3/uL (ref 150–400)
RBC: 2.3 MIL/uL — AB (ref 4.22–5.81)
RDW: 22.4 % — AB (ref 11.5–15.5)
WBC: 10.5 10*3/uL (ref 4.0–10.5)

## 2015-02-21 LAB — COMPREHENSIVE METABOLIC PANEL
ALT: 18 U/L (ref 17–63)
ANION GAP: 9 (ref 5–15)
AST: 29 U/L (ref 15–41)
Albumin: 2 g/dL — ABNORMAL LOW (ref 3.5–5.0)
Alkaline Phosphatase: 113 U/L (ref 38–126)
BILIRUBIN TOTAL: 1.2 mg/dL (ref 0.3–1.2)
BUN: 102 mg/dL — ABNORMAL HIGH (ref 6–20)
CO2: 23 mmol/L (ref 22–32)
Calcium: 7.9 mg/dL — ABNORMAL LOW (ref 8.9–10.3)
Chloride: 106 mmol/L (ref 101–111)
Creatinine, Ser: 3.28 mg/dL — ABNORMAL HIGH (ref 0.61–1.24)
GFR calc Af Amer: 21 mL/min — ABNORMAL LOW (ref >60)
GFR, EST NON AFRICAN AMERICAN: 18 mL/min — AB (ref >60)
Glucose, Bld: 207 mg/dL — ABNORMAL HIGH (ref 65–99)
POTASSIUM: 3.2 mmol/L — AB (ref 3.5–5.1)
Sodium: 138 mmol/L (ref 135–145)
TOTAL PROTEIN: 6.1 g/dL — AB (ref 6.5–8.1)

## 2015-02-21 LAB — RENAL FUNCTION PANEL
ANION GAP: 9 (ref 5–15)
Albumin: 2 g/dL — ABNORMAL LOW (ref 3.5–5.0)
BUN: 100 mg/dL — AB (ref 6–20)
CHLORIDE: 107 mmol/L (ref 101–111)
CO2: 22 mmol/L (ref 22–32)
Calcium: 7.9 mg/dL — ABNORMAL LOW (ref 8.9–10.3)
Creatinine, Ser: 3.22 mg/dL — ABNORMAL HIGH (ref 0.61–1.24)
GFR calc Af Amer: 21 mL/min — ABNORMAL LOW (ref >60)
GFR calc non Af Amer: 18 mL/min — ABNORMAL LOW (ref >60)
GLUCOSE: 176 mg/dL — AB (ref 65–99)
PHOSPHORUS: 4.4 mg/dL (ref 2.5–4.6)
POTASSIUM: 3 mmol/L — AB (ref 3.5–5.1)
Sodium: 138 mmol/L (ref 135–145)

## 2015-02-21 LAB — PREALBUMIN: PREALBUMIN: 17.5 mg/dL — AB (ref 18–38)

## 2015-02-21 LAB — GLUCOSE, CAPILLARY
GLUCOSE-CAPILLARY: 155 mg/dL — AB (ref 65–99)
GLUCOSE-CAPILLARY: 168 mg/dL — AB (ref 65–99)
GLUCOSE-CAPILLARY: 184 mg/dL — AB (ref 65–99)
GLUCOSE-CAPILLARY: 192 mg/dL — AB (ref 65–99)
Glucose-Capillary: 253 mg/dL — ABNORMAL HIGH (ref 65–99)
Glucose-Capillary: 183 mg/dL — ABNORMAL HIGH (ref 65–99)
Glucose-Capillary: 170 mg/dL — ABNORMAL HIGH (ref 65–99)

## 2015-02-21 LAB — DIFFERENTIAL
BASOS ABS: 0 10*3/uL (ref 0.0–0.1)
Basophils Relative: 0 % (ref 0–1)
EOS PCT: 0 % (ref 0–5)
Eosinophils Absolute: 0 10*3/uL (ref 0.0–0.7)
Lymphocytes Relative: 5 % — ABNORMAL LOW (ref 12–46)
Lymphs Abs: 0.5 10*3/uL — ABNORMAL LOW (ref 0.7–4.0)
MONO ABS: 0.5 10*3/uL (ref 0.1–1.0)
MONOS PCT: 5 % (ref 3–12)
Neutro Abs: 9.5 10*3/uL — ABNORMAL HIGH (ref 1.7–7.7)
Neutrophils Relative %: 90 % — ABNORMAL HIGH (ref 43–77)

## 2015-02-21 LAB — BASIC METABOLIC PANEL
ANION GAP: 9 (ref 5–15)
BUN: 103 mg/dL — ABNORMAL HIGH (ref 6–20)
CALCIUM: 7.9 mg/dL — AB (ref 8.9–10.3)
CO2: 22 mmol/L (ref 22–32)
Chloride: 105 mmol/L (ref 101–111)
Creatinine, Ser: 3.48 mg/dL — ABNORMAL HIGH (ref 0.61–1.24)
GFR calc Af Amer: 19 mL/min — ABNORMAL LOW (ref >60)
GFR, EST NON AFRICAN AMERICAN: 17 mL/min — AB (ref >60)
GLUCOSE: 207 mg/dL — AB (ref 65–99)
Potassium: 3.6 mmol/L (ref 3.5–5.1)
Sodium: 136 mmol/L (ref 135–145)

## 2015-02-21 LAB — TRIGLYCERIDES: Triglycerides: 183 mg/dL — ABNORMAL HIGH (ref <150)

## 2015-02-21 LAB — PHOSPHORUS: Phosphorus: 4.8 mg/dL — ABNORMAL HIGH (ref 2.5–4.6)

## 2015-02-21 LAB — PREPARE RBC (CROSSMATCH)

## 2015-02-21 LAB — OCCULT BLOOD X 1 CARD TO LAB, STOOL: Fecal Occult Bld: POSITIVE — AB

## 2015-02-21 LAB — APTT: APTT: 31 s (ref 24–37)

## 2015-02-21 LAB — CALCIUM, IONIZED: CALCIUM, IONIZED, SERUM: 4.5 mg/dL (ref 4.5–5.6)

## 2015-02-21 LAB — MAGNESIUM: MAGNESIUM: 1.9 mg/dL (ref 1.7–2.4)

## 2015-02-21 MED ORDER — SODIUM CHLORIDE 0.9 % IV SOLN
Freq: Once | INTRAVENOUS | Status: AC
Start: 2015-02-21 — End: 2015-02-21
  Administered 2015-02-21: 12:00:00 via INTRAVENOUS

## 2015-02-21 MED ORDER — HALOPERIDOL LACTATE 5 MG/ML IJ SOLN
INTRAMUSCULAR | Status: AC
  Administered 2015-02-21: 5 mg
  Filled 2015-02-21: qty 1

## 2015-02-21 MED ORDER — POTASSIUM CHLORIDE 10 MEQ/50ML IV SOLN
10.0000 meq | INTRAVENOUS | Status: AC
Start: 2015-02-21 — End: 2015-02-21
  Administered 2015-02-21 (×3): 10 meq via INTRAVENOUS
  Filled 2015-02-21: qty 50

## 2015-02-21 MED ORDER — HALOPERIDOL LACTATE 5 MG/ML IJ SOLN
5.0000 mg | Freq: Once | INTRAMUSCULAR | Status: AC
  Administered 2015-02-21: 5 mg via INTRAVENOUS

## 2015-02-21 MED ORDER — POTASSIUM CHLORIDE 10 MEQ/50ML IV SOLN
10.0000 meq | INTRAVENOUS | Status: AC
  Administered 2015-02-21 (×3): 10 meq via INTRAVENOUS
  Filled 2015-02-21: qty 50

## 2015-02-21 MED ORDER — POTASSIUM CHLORIDE 10 MEQ/50ML IV SOLN: 10.0000 meq | INTRAVENOUS | Status: DC

## 2015-02-21 MED ORDER — HALOPERIDOL LACTATE 5 MG/ML IJ SOLN
INTRAMUSCULAR | Status: AC
  Filled 2015-02-21: qty 1

## 2015-02-21 MED ORDER — FAT EMULSION 20 % IV EMUL
240.0000 mL | INTRAVENOUS | Status: AC
  Administered 2015-02-21: 240 mL via INTRAVENOUS
  Filled 2015-02-21: qty 250

## 2015-02-21 MED ORDER — TRACE MINERALS CR-CU-MN-SE-ZN 10-1000-500-60 MCG/ML IV SOLN
INTRAVENOUS | Status: AC
  Administered 2015-02-21: 17:00:00 via INTRAVENOUS
  Filled 2015-02-21: qty 1920

## 2015-02-21 NOTE — Progress Notes (Signed)
Pt has requested RT not to bother him until 10am tomorrow morning for breathing TX.  RT to continue to monitor and assess throughout the night.

## 2015-02-21 NOTE — Progress Notes (Signed)
PT Cancellation Note  Patient Details Name: KAZ AULD MRN: [id number] DOB: [birth year]   Cancelled Treatment:     C/M am HgB 6.8 plans to receive one unit.  Will check back tomorrow.     Armando Reichert 02/21/2015, 7:56 AM

## 2015-02-21 NOTE — Progress Notes (Signed)
PULMONARY / CRITICAL CARE MEDICINE   Name: Larry [name] MRN: [id number] DOB: September 20, 1945    ADMISSION DATE:  01/19/2015 CONSULTATION DATE:  8/4  REFERRING MD :  [name]  CHIEF COMPLAINT:  Septic shock   INITIAL PRESENTATION:  69 year old male w/ CAF on Elliquis who recently underwent lap chole on 7/14. Was discharged home w/drain. Drain removed about a week before presentation to ER. Admitted directly from [facility] on 8/3 w/ septic shock which was likely due to infected hematoma in the GB fossa. PCCM asked to see after new perc drain placed on 8/4, when he remained hypotensive.  Required CRRT 8/23 for AKI  Intubated 8/26 for desaturations, significant agitation, delirium  STUDIES:  CT abdomen/Pelvis W/ 7/28 - Gas & fluid at cholecystectomy site w/o discrete abscess. Min ascites. Small right pleural eff. CT abdomen/pelvis w/o 8/5 - Gas between liver & stomach. No additional free air. Increased flow void & high density layering c/w increasing hemorrhage as well as increased right pleural eff. CT abdomen/pelvis 8/18 >> 9.8 cm hematoma with gas in the gallbladder fossa, indwelling pigtail drainage catheter, 12.6 cm hematoma along the posterior R liver, mod ascites, mild wall thickening involving the R colon CT abdomen/pelvis 8/22 >> stable perihepatic hematoma, stable gallbladder fossa collection despite adequate drainage cath placement, significant free fluid within the abdomen/pelvis slightly increased from prior exam 8/31 CT abd >>persistent large fluid collection at the gallbladder fossa 11.7 x 9.0 x 8.5 cm previously 9.7 x 9.7 x 8.8 cm. Additional subhepatic collection anteromedial to gallbladder collection containing air and fluid, decreased in size now measuring 5.0 x 5.0 x 4.4 cm, Significant ascites  SIGNIFICANT EVENTS: Admitted 8/3 w/ abd pain and hypotension  8/04  hypotensive over-night. Went for [facility] drain of GB fossa. Found what looked like old infected hematoma. PCCM  asked to see post-op for shock 8/05  off pressors. WOB worse.  8/06  WOB & wheezing slightly worse. Progressing abdominal pain 8/07  Hgb declining w/ increasing intraperitoneal hemorrhage 8/07  Transfused 2u PRBCs 8/09  work of breathing still significant w/ marked upper airway component  8/10  breathing better. 1.6 liters negative. Slight bump in scr. Vanc stopped. Getting OOB for first time 8/11  eating regular diet. Up in chair. Feeling better.  Respiratory status improved.  PCCM s/o 8/14  Renal consulted for rising sr cr (to 3.60), baseline sr cr 1.1-1.5 (prior IV contrast 7/28).   8/14  Concern for yeast / bacterial UTI  8/15  ID consulted > abx narrowed to zosyn only, fluconazole for yeast  8/16  UOP increased on IV lasix 8/18  NPO, sr cr improving, CT abd as above  8/18  GB drain upsized per IR to 16 Fr 8/19  Abd pain, eating well.  Diarrhea after ensure.  To PO lasix.  Discussed gastrostomy tube with IR 8/22  Sr Cr rising, intermittent vomiting.  CT abd as above.  Increased confusion 8/23  HD cath inserted, CRRT initiated.  Gastrostomy tube held off due to CRRT & Hgb drop 8/24  WBC elevated, cultures repeated.   8/24  Paracentesis >> 150 ml "bloody bile" fluid removed, loculated and difficult to aspirate 8/25  Palliative care consulted, PCCM consulted.  Vomiting with NGT insertion.  8/26  Intubated early am(0200) for desaturations, significant agitation, delirium  8/30 CRRT stopped, bilious vomiting/secretions 8/31 Paracentesis >> 3.1 L 9/1 1 U PRBC 9/4- vaso re added, improved neo off  SUBJECTIVE:   Monotherapy vaso remains  VITAL SIGNS: Temp:  [97.5 F (36.4 C)-98.2 F (36.8 C)] 98.2 F (36.8 C) (09/05 0800) Pulse Rate:  [48-111] 87 (09/05 0630) Resp:  [14-35] 32 (09/05 0630) BP: (88-158)/(34-141) 126/58 mmHg (09/05 0630) SpO2:  [91 %-98 %] 97 % (09/05 0630) Weight:  [111.1 kg (244 lb 14.9 oz)] 111.1 kg (244 lb 14.9 oz) (09/05 0530)  HEMODYNAMICS:     VENTILATOR  SETTINGS:     INTAKE / OUTPUT:  Intake/Output Summary (Last 24 hours) at 02/21/15 0835 Last data filed at 02/21/15 0700  Gross per 24 hour  Intake 3573.98 ml  Output   2150 ml  Net 1423.98 ml    PHYSICAL EXAMINATION: General:  Acutely ill adult male , no distress Neuro:  Interactive, nonfocal, oriented HEENT:  HD cath site clean Cardiovascular:  s1s2 rrr, no m/r/g Lungs:  Coughing, scattered ronchi Abdomen: Hypoactive BS. Non- tender, drain in place, NGT 200 out only overnight Musculoskeletal:  No rash, joint inflammation Skin:  Normal edema legs  LABS:  CBC  Recent Labs Lab 02/19/15 0504 02/20/15 0450 02/21/15 0555  WBC 9.5 12.3* 10.5  HGB 7.7* 8.0* 6.8*  HCT 24.3* 24.9* 21.5*  PLT 244 281 298   Coag's  Recent Labs Lab 02/19/15 0504 02/20/15 0450 02/21/15 0555  APTT 33 37 31   BMET  Recent Labs Lab 02/20/15 1655 02/21/15 0044 02/21/15 0555  NA 136 136 138  K 3.5 3.6 3.2*  CL 103 105 106  CO2 23 22 23   BUN 100* 103* 102*  CREATININE 3.46* 3.48* 3.28*  GLUCOSE 260* 207* 207*   Electrolytes  Recent Labs Lab 02/20/15 0450 02/20/15 1655 02/20/15 1700 02/21/15 0044 02/21/15 0555  CALCIUM 7.7*  7.7* 7.7*  --  7.9* 7.9*  MG 1.8  --  1.8  --  1.9  PHOS 5.5*  5.5* 4.6  --   --  4.8*   Sepsis Markers No results for input(s): LATICACIDVEN, PROCALCITON, O2SATVEN in the last 168 hours.   ABG  Recent Labs Lab 02/14/15 0842  PHART 7.420  PCO2ART 52.3*  PO2ART 73.7*   Liver Enzymes  Recent Labs Lab 02/17/15 0500  02/20/15 0450 02/20/15 1655 02/21/15 0555  AST 47*  --   --   --  29  ALT 26  --   --   --  18  ALKPHOS 136*  --   --   --  113  BILITOT 1.7*  --   --   --  1.2  ALBUMIN 1.8*  < > 1.7* 1.9* 2.0*  < > = values in this interval not displayed.   Cardiac Enzymes No results for input(s): TROPONINI, PROBNP in the last 168 hours.   Glucose  Recent Labs Lab 02/20/15 1240 02/20/15 1708 02/20/15 2002 02/21/15 0037  02/21/15 0424 02/21/15 0742  GLUCAP 244* 234* 253* 192* 183* 184*    Imaging No results found.  ASSESSMENT / PLAN:  PULMONARY A: ETT 8/26 >> 8/29 Acute Hypoxic Respiratory Failure - in the setting of significant Agitation, renal fx, delirium  - resolved Chronically elevated right HD Right pleural effusion - progessing on Abd CT 8/6 Cough 9/5 P:   Xopenex neb q6hr PRN IS Pt active, walked to desk pcxr for cough  CARDIOVASCULAR CVL Right IJ 8/4 >> 8/22 RUE PICC 8/22 >>  A:  Severe sepsis/septic shock - remains Hypotension - 8/26, started on neo post intubation, smouldering sepsis Hx PAF - CHADVASC score 4; currently NSR  Prior NSTEMI  Adrenal insuff P:  Goal MAP 55 with good MS keep stress steroids as shock remains Continued monotherapy vasopressin Hold further albumin, may redose if not off vaso by afternoon No role florinef with renal failure  RENAL A:   AKI- in setting of septic shock, prior hypotension/ATN Non oliguric now Hypo k  P:   supp k  tpn Chem in am  Keep HD cath, not convinced of full renal recovery  GASTROINTESTINAL A:   Infected hematoma s/p cholecystectomy - s/p CT guided Perc drain with subsequent upsize to 16 Fr, Paracentesis 8/31 c/w peritonitis GERD - worse 8/5 w/ associated UAW wheeze Intra-abdominal hemorrhage / hematoma  Nausea / Vomiting - concern for ileus (8/25) Protein Calorie Malnutrition   P:   TNA per pharmacy  Protonix QD NPO, but get swallow eval, if pass start clears dc ngt per surgery  HEMATOLOGIC A:   Anemia - in setting of intra-abdominal hemorrhage (8/4).  S/p 2 u PRBC on 8/4,2u 8/7  , 1 U 9/1, 1 unit 9/5 Dilutional anemia contribution P:  Limit phlebotomy when able SCDs Transfuse 1 unit likely needs lasix in am   INFECTIOUS A:   Severe sepsis/septic shock - presume source is infected hematoma from recent Cholecystectomy. Shock resolved.  S/p Perc GB fossa site drain 8/4 - minimal serous output High Risk  for Fungemia P:   ID following, appreciate input   Abscess 8/4>>>neg BCX2 8/4>>>neg  Peritoneal fluid GS 8/24 >> abundant WBC, no organisms seen Peritoneal fluid culture 8/24 >> gram stain gram neg rod>>>culture neg>>> BCx2 8/25 >> ng Urine 8/14 >> yeast  Ascitic fluid 8/31 >ng c diff neg 9/1  Meropenem 8/24 >>  Fluconazole 8/15 >> 8/23 anigulofungin 8/26>>> vanc 8/28>>>8/31  Per ID Follow per culture  ENDOCRINE A: DM - CBG's better  Controlled, actually low improved AI P:   SSI Q4 lantus 20 insulin in TNA  NEUROLOGIC A:  Delirium  Critical Illness Deconditioning  P:   Pt doing well   FAMILY  - Updates:  D-in law 8/30  - Inter-disciplinary family meet or Palliative Care meeting due by:  Ongoing, family wants to continue full aggressive measures.  Hopeful that patient will be able to recover.    Ccm time 30 min   Mcarthur Rossetti. Tyson Alias, MD, FACP Pgr: [phone] [facility]

## 2015-02-21 NOTE — Progress Notes (Signed)
PARENTERAL NUTRITION CONSULT NOTE   Pharmacy Consult for TPN Indication: Prolonged ileus  No Known Allergies  Patient Measurements: Height: _0  (167.6 cm) Weight: 244 lb 14.9 oz (111.1 kg) IBW/kg (Calculated) : 63.8 Adjusted Body Weight: 75.6 kg, recalculated 9/5  Vital Signs: Temp: 98.2 F (36.8 C) (09/05 0800) Temp Source: Oral (09/05 0800) BP: 126/58 mmHg (09/05 0630) Pulse Rate: 87 (09/05 0630) Intake/Output from previous day: 09/04 0701 - 09/05 0700 In: 3695.3 [I.V.:557.8; IV Piggyback:1067.5; TPN:2070] Out: 2335 [Urine:1920; Emesis/NG output:400; Drains:15] Intake/Output from this shift:    Labs:  Recent Labs  02/19/15 0504 02/20/15 0450 02/21/15 0555  WBC 9.5 12.3* 10.5  HGB 7.7* 8.0* 6.8*  HCT 24.3* 24.9* 21.5*  PLT 244 281 298  APTT 33 37 31     Recent Labs  02/20/15 0450 02/20/15 1655 02/20/15 1700 02/21/15 0044 02/21/15 0555  NA 136  136 136  --  136 138  K 3.3*  3.3* 3.5  --  3.6 3.2*  CL 101  100* 103  --  105 106  CO2 _1 --  22 23  GLUCOSE 217*  216* 260*  --  207* 207*  BUN 103*  103* 100*  --  103* 102*  CREATININE 3.63*  3.64* 3.46*  --  3.48* 3.28*  CALCIUM 7.7*  7.7* 7.7*  --  7.9* 7.9*  MG 1.8  --  1.8  --  1.9  PHOS 5.5*  5.5* 4.6  --   --  4.8*  PROT  --   --   --   --  6.1*  ALBUMIN 1.7* 1.9*  --   --  2.0*  AST  --   --   --   --  29  ALT  --   --   --   --  18  ALKPHOS  --   --   --   --  113  BILITOT  --   --   --   --  1.2  PREALBUMIN  --   --   --   --  17.5*  TRIG  --   --   --   --  183*   Estimated Creatinine Clearance: 25.2 mL/min (by C-G formula based on Cr of 3.28).    Recent Labs  02/21/15 0037 02/21/15 0424 02/21/15 0742  GLUCAP 192* 183* 184*    Insulin Requirements: 41 units of Novolog SSI + Lantus 15 units + 30 units regular insulin provided by TPN  Current Nutrition: NPO, unable to tolerate TF  IVF: NS KVO  Central access: PICC  TPN start date:  8/22  ASSESSMENT                                                                                                           HPI: 39 YOM presents with worsening abdominal pain on 8/3.  Recent cholecystectomy on 01/04/2015 with drain removed 2 weeks prior to admission. Found to have GB fossa fluid collection (infected hematoma) and abscess. IR placed GB fossa drain.  Pharmacy asked to start TPN 8/22 for prolonged ileus and interment vomiting prohibiting use of enteral nutrition.    Significant events:  8/3 septic shock from infected GB fossa 8/22 orders to start TPN 8/23 Start CRRT 8/25 intubated 8/29 extubated 8/30 CRRT stopped 9/2: Reglan restarted.  Having multiple BMs.  9/3-9/4: NG output still high.  Holding on clamping trials for now.  9/5: NG output decreased, starting 24  Today, 02/21/2015:    Glucose - uncontrolled on Lantus qhs + SSI + insulin in TPN.  Patient on Lantus 40 units daily PTA. Steroids tapered off and lantus and TPN insulin reduced.  Steroids restarted 9/e and CBGs now uncontrolled . MD has increased lantus dose 9/5.   Electrolytes:  Na WNL   Persistent hypokalemia requiring daily repletion  Mg WNL, near goal >2 after 1g on 9/4  Phos elevated, but improving, now near WNL.  Electrolytes removed from TPN.  Protein supplementation at ~1.3 g/kg/day (adjusted body weight)  CorrCa WNL. CaPhos product 46.  Renal - nonoliguric AKI. CRRT 8/23-8/30.   24h I/O = +1050m  SCr improving slowly, but BUN remains the same, CRRT stopped 8/30 AM, hoping for renal recovery  Urine output documented as 0.3 mL/kg/hr yesterday, continues to make good urine today.  LFTs - AST/ALT, Alk Phos ok, tbili 1.7  TGs - 155 (8/23), 175 (8/29), 183 (9/5)  Prealbumin - 5.3 (8/22), 10 (8/29), 17.5 (9/5)  NUTRITIONAL GOALS                                                                                             Updated RD recs 8/31 (now that off CRRT): 1950-2150 kcals, protein 95-105 g (fluid 2L/day) Clinimix 5/15 at  a goal rate of 834mhr + 20% fat emulsion at 1025mr to provide: 96g/day protein, 1843Kcal/day.  PLAN                                                                                                             At 1800 today:  Continue Clinimix 5/15 (WITHOUT lytes) at 80 ml/hr.  Patient is off of CRRT, watching for recovery.  Protein supplementation at ~1.3 g/kg/day of adjusted body weight.  Concerned that reducing protein supplementation further would not benefit this acutely ill patient.  BUN may rise regardless of exogenous protein supplementation in this catabolic patient; however, if BUN continues to increase, may consider reducing protein provided by TPN.   Continue regular insulin in TPN: Maintain 30 units of regular insulin over 24 hour infusion.   Continue 20% lipid emulsion at 10 ml/hr.  Held lipids for first week of ICU admission.  Resumed 8/30.   TPN to contain standard multivitamins  and trace elements.  Continue to provide all trace elements for now.  Will need to consider removing chromium and selenium if SCr does not show improvement over next several days and remains on TPN.  Monitor electrolytes closely.  Consider adding electrolytes back to TPN once phos WNL if all other lytes still trending down.   Potassium chloride 81mq q1h x 3 per MD  Potassium chloride 136m q1h x 6 per PharmD  Check renal panel this afternoon and replete again as needed  CBG monitoring, SSI q4h  Daily renal function panel, Mg and Phos ordered per MD   F/u daily.    CoRalene BathePharmD, BCPS 02/21/2015, 9:47 AM  Pager: [phone]

## 2015-02-21 NOTE — Progress Notes (Signed)
eLink Physician-Brief Progress Note Patient Name: Larry [name] DOB: [birth year] MRN: [id number]   Date of Service  02/21/2015  HPI/Events of Note  Anxious, c/o insomnia, npo  eICU Interventions  Haldol x 1     Intervention Category Minor Interventions: Agitation / anxiety - evaluation and management  Hermila Millis V. 02/21/2015, 10:57 PM

## 2015-02-21 NOTE — Progress Notes (Signed)
eLink Physician-Brief Progress Note Patient Name: Larry [name] DOB: 02-19-1946 MRN: [id number]   Date of Service  02/21/2015  HPI/Events of Note  Notified that the patient is having frequent PVC. Electrolytes have been repleted. We will check EKG and QTC now. Also informed that patient has had increased cough. I will recheck chest x-ray for tomorrow morning  eICU Interventions       Intervention Category Intermediate Interventions: Arrhythmia - evaluation and management  Christopher Glasscock S. 02/21/2015, 2:23 AM

## 2015-02-21 NOTE — Progress Notes (Signed)
CRITICAL VALUE ALERT  Critical value received:  hgb 6.8  Date of notification:  02/21/15  Time of notification:  0702  Critical value read back:Yes.    Nurse who received alert:  Reuel Boom RN  MD notified (1st page):  Dr Tyson Alias  Time of first page: 0710  MD notified (2nd page):  Time of second page:  Responding MD:  Dr Tyson Alias  Time MD responded:  0730, new order received

## 2015-02-21 NOTE — Progress Notes (Signed)
Admit: 01/19/2015 LOS: 33  44M AKI 2/2 ATN ,req CRRT through 8/30 w/ septic shock infected GB fossa, VDRF  Subjective:  Off of pressors as of 8 am. BP's 110-130, good UOP matching intake +/- 1 L Has L IJ non tunneled HD cath  09/04 0701 - 09/05 0700 In: 3930.9 [I.V.:703.4; IV Piggyback:1067.5; TPN:2160] Out: 2335 [Urine:1920; Emesis/NG output:400; Drains:15]  Filed Weights   02/19/15 0500 02/20/15 0500 02/21/15 0530  Weight: 113.5 kg (250 lb 3.6 oz) 109.6 kg (241 lb 10 oz) 111.1 kg (244 lb 14.9 oz)    Scheduled Meds: . sodium chloride   Intravenous Once  . anidulafungin  100 mg Intravenous Q24H  . antiseptic oral rinse  7 mL Mouth Rinse q12n4p  . chlorhexidine  15 mL Mouth Rinse BID  . darbepoetin (ARANESP) injection - NON-DIALYSIS  200 mcg Subcutaneous Q Wed-1800  . hydrocortisone sodium succinate  50 mg Intravenous Q6H  . insulin aspart  0-20 Units Subcutaneous 6 times per day  . insulin glargine  20 Units Subcutaneous Daily  . ipratropium  0.5 mg Nebulization Q6H  . levalbuterol  0.63 mg Nebulization Q6H  . nystatin   Topical TID  . pantoprazole (PROTONIX) IV  40 mg Intravenous Q24H  . piperacillin-tazobactam (ZOSYN)  IV  3.375 g Intravenous 3 times per day  . sodium chloride  10-40 mL Intracatheter Q12H   Continuous Infusions: . sodium chloride 250 mL (02/20/15 0857)  . TPN (CLINIMIX) Adult without lytes 80 mL/hr at 02/20/15 1721   And  . fat emulsion 240 mL (02/20/15 1721)  . TPN (CLINIMIX) Adult without lytes     And  . fat emulsion    . phenylephrine (NEO-SYNEPHRINE) Adult infusion Stopped (02/20/15 2334)  . vasopressin (PITRESSIN) infusion - *FOR SHOCK* Stopped (02/21/15 0853)   PRN Meds:.acetaminophen, fentaNYL (SUBLIMAZE) injection, iohexol, iohexol, ipratropium, levalbuterol, [DISCONTINUED] ondansetron **OR** ondansetron (ZOFRAN) IV, sodium chloride  Current Labs: reviewed    Physical Exam:  Blood pressure 128/56, pulse 88, temperature 97.7 F (36.5 C),  temperature source Oral, resp. rate 29, height  (1.676 m), weight 111.1 kg (244 lb 14.9 oz), SpO2 98 %. Frail, NAD NGT ijn place  RIJ HD Cath RRR Foley in place  Assessment: 1. AKI 2/2 ATN: stable, good UOP, Lytes ok.  Creat stuck mid 3's.   2. CKD stage III, baseline creat 1.4- 1.8 3. Volume - stable, no vol excess 4. Nutrition on TNA 5. S/P sepsis/ infected GB fossa  Plan - order written to dc HD cath.  Stable renal function but poor function overall.  Have d/w family and patient. Will sign off, call as needed. If patient comes to discharge , call and we will set up OP f/u.   Sabra Heck MD 02/21/2015, 3:27 PM   Recent Labs Lab 02/20/15 0450 02/20/15 1655 02/21/15 0044 02/21/15 0555  NA 136  136 136 136 138  K 3.3*  3.3* 3.5 3.6 3.2*  CL 101  100* 103 105 106  CO2 GLUCOSE 217*  216* 260* 207* 207*  BUN 103*  103* 100* 103* 102*  CREATININE 3.63*  3.64* 3.46* 3.48* 3.28*  CALCIUM 7.7*  7.7* 7.7* 7.9* 7.9*  PHOS 5.5*  5.5* 4.6  --  4.8*    Recent Labs Lab 02/19/15 0504 02/20/15 0450 02/21/15 0555  WBC 9.5 12.3* 10.5  NEUTROABS  --   --  9.5*  HGB 7.7* 8.0* 6.8*  HCT 24.3* 24.9* 21.5*  MCV  93.8 92.9 93.5  PLT 244 281 298

## 2015-02-21 NOTE — Progress Notes (Signed)
eLink Physician-Brief Progress Note Patient Name: Larry [name] DOB: March 29, 1946 MRN: [id number]   Date of Service  02/21/2015  HPI/Events of Note    eICU Interventions  Hypokalemia -repleted      Intervention Category Intermediate Interventions: Electrolyte abnormality - evaluation and management  Wheeler Incorvaia V. 02/21/2015, 6:28 PM

## 2015-02-21 NOTE — Progress Notes (Signed)
Patient ID: Larry [name], male   DOB: 27-Jun-1945, 69 y.o.   MRN: [id number]  General Surgery [facility]  Subjective: Patient awake, animated.  Wants to talk about physical therapy, rehab.  No abdominal complaints.  Objective: Vital signs in last 24 hours: Temp:  [97.5 F (36.4 C)-98 F (36.7 C)] 97.8 F (36.6 C) (09/05 0400) Pulse Rate:  [48-111] 87 (09/05 0630) Resp:  [14-35] 32 (09/05 0630) BP: (88-158)/(34-141) 126/58 mmHg (09/05 0630) SpO2:  [91 %-98 %] 97 % (09/05 0630) Weight:  [111.1 kg (244 lb 14.9 oz)] 111.1 kg (244 lb 14.9 oz) (09/05 0530) Last BM Date: 02/20/15  Intake/Output from previous day: 09/04 0701 - 09/05 0700 In: 3695.3 [I.V.:557.8; IV Piggyback:1067.5; TPN:2070] Out: 2335 [Urine:1920; Emesis/NG output:400; Drains:15] Intake/Output this shift:    Physical Exam: HEENT - sclerae clear, mucous membranes moist Neck - soft, IJ line in place Chest - coarse bilaterally Cor - mild tachycardia Abdomen - soft, obese; few BS present; minimal tenderness, no guarding; thin bilious in NG  Lab Results:   Recent Labs  02/20/15 0450 02/21/15 0555  WBC 12.3* 10.5  HGB 8.0* 6.8*  HCT 24.9* 21.5*  PLT 281 298   BMET  Recent Labs  02/21/15 0044 02/21/15 0555  NA 136 138  K 3.6 3.2*  CL 105 106  CO2 22 23  GLUCOSE 207* 207*  BUN 103* 102*  CREATININE 3.48* 3.28*  CALCIUM 7.9* 7.9*   PT/INR No results for input(s): LABPROT, INR in the last 72 hours. Comprehensive Metabolic Panel:    Component Value Date/Time   NA 138 02/21/2015 0555   NA 136 02/21/2015 0044   K 3.2* 02/21/2015 0555   K 3.6 02/21/2015 0044   CL 106 02/21/2015 0555   CL 105 02/21/2015 0044   CO2 23 02/21/2015 0555   CO2 22 02/21/2015 0044   BUN 102* 02/21/2015 0555   BUN 103* 02/21/2015 0044   CREATININE 3.28* 02/21/2015 0555   CREATININE 3.48* 02/21/2015 0044   GLUCOSE 207* 02/21/2015 0555   GLUCOSE 207* 02/21/2015 0044   CALCIUM 7.9* 02/21/2015 0555   CALCIUM 7.9* 02/21/2015 0044   AST 29 02/21/2015 0555   AST 47* 02/17/2015 0500   ALT 18 02/21/2015 0555   ALT 26 02/17/2015 0500   ALKPHOS 113 02/21/2015 0555   ALKPHOS 136* 02/17/2015 0500   BILITOT 1.2 02/21/2015 0555   BILITOT 1.7* 02/17/2015 0500   PROT 6.1* 02/21/2015 0555   PROT 5.7* 02/17/2015 0500   ALBUMIN 2.0* 02/21/2015 0555   ALBUMIN 1.9* 02/20/2015 1655    Studies/Results: No results found.  Anti-infectives: Anti-infectives    Start     Dose/Rate Route Frequency Ordered Stop   02/17/15 1400  vancomycin (VANCOCIN) IVPB 1000 mg/200 mL premix     1,000 mg 200 mL/hr over 60 Minutes Intravenous  Once 02/17/15 1320 02/17/15 1502   02/16/15 2200  piperacillin-tazobactam (ZOSYN) IVPB 3.375 g     3.375 g 12.5 mL/hr over 240 Minutes Intravenous 3 times per day 02/16/15 1603     02/15/15 2200  meropenem (MERREM) 500 mg in sodium chloride 0.9 % 50 mL IVPB  Status:  Discontinued     500 mg 100 mL/hr over 30 Minutes Intravenous Every 12 hours 02/15/15 1415 02/16/15 1533   02/14/15 1200  vancomycin (VANCOCIN) IVPB 1000 mg/200 mL premix  Status:  Discontinued     1,000 mg 200 mL/hr over 60 Minutes Intravenous Every 24 hours 02/13/15 0920 02/16/15 0706  02/13/15 1000  vancomycin (VANCOCIN) 1,750 mg in sodium chloride 0.9 % 500 mL IVPB     1,750 mg 250 mL/hr over 120 Minutes Intravenous  Once 02/13/15 0836 02/13/15 1210   02/12/15 1630  meropenem (MERREM) 500 mg in sodium chloride 0.9 % 50 mL IVPB  Status:  Discontinued     500 mg 100 mL/hr over 30 Minutes Intravenous Every 6 hours 02/12/15 1106 02/15/15 1415   02/11/15 1800  anidulafungin (ERAXIS) 100 mg in sodium chloride 0.9 % 100 mL IVPB     100 mg over 90 Minutes Intravenous Every 24 hours 02/10/15 1744     02/10/15 1730  anidulafungin (ERAXIS) 200 mg in sodium chloride 0.9 % 200 mL IVPB     200 mg over 180 Minutes Intravenous  Once 02/10/15 1720 02/10/15 2230   02/10/15 0200  meropenem (MERREM) 500 mg in sodium  chloride 0.9 % 50 mL IVPB  Status:  Discontinued     500 mg 100 mL/hr over 30 Minutes Intravenous Every 8 hours 02/09/15 1808 02/12/15 1106   02/10/15 0000  meropenem (MERREM) 500 mg in sodium chloride 0.9 % 50 mL IVPB  Status:  Discontinued     500 mg 100 mL/hr over 30 Minutes Intravenous 4 times per day 02/09/15 1655 02/09/15 1808   02/09/15 1800  meropenem (MERREM) 1 g in sodium chloride 0.9 % 100 mL IVPB     1 g 200 mL/hr over 30 Minutes Intravenous  Once 02/09/15 1655 02/09/15 1818   02/08/15 1800  piperacillin-tazobactam (ZOSYN) IVPB 3.375 g  Status:  Discontinued     3.375 g 100 mL/hr over 30 Minutes Intravenous 4 times per day 02/08/15 1403 02/09/15 1655   02/07/15 1400  piperacillin-tazobactam (ZOSYN) IVPB 2.25 g  Status:  Discontinued     2.25 g 100 mL/hr over 30 Minutes Intravenous 3 times per day 02/07/15 0750 02/08/15 1403   02/04/15 1200  piperacillin-tazobactam (ZOSYN) IVPB 2.25 g  Status:  Discontinued     2.25 g 100 mL/hr over 30 Minutes Intravenous 4 times per day 02/04/15 0859 02/04/15 0905   02/04/15 1200  piperacillin-tazobactam (ZOSYN) IVPB 3.375 g  Status:  Discontinued     3.375 g 12.5 mL/hr over 240 Minutes Intravenous Every 8 hours 02/04/15 0905 02/07/15 0750   02/02/15 2000  fluconazole (DIFLUCAN) tablet 100 mg     100 mg Oral Every 24 hours 02/02/15 1908 02/08/15 2041   02/02/15 1800  fluconazole (DIFLUCAN) tablet 100 mg  Status:  Discontinued     100 mg Oral Daily 02/02/15 1545 02/02/15 1901   01/31/15 1000  fluconazole (DIFLUCAN) tablet 100 mg  Status:  Discontinued     100 mg Oral Daily 01/31/15 0727 02/02/15 1313   01/29/15 1700  piperacillin-tazobactam (ZOSYN) IVPB 2.25 g  Status:  Discontinued     2.25 g 100 mL/hr over 30 Minutes Intravenous Every 8 hours 01/29/15 0845 02/04/15 0859   01/24/15 2200  vancomycin (VANCOCIN) IVPB 1000 mg/200 mL premix     1,000 mg 200 mL/hr over 60 Minutes Intravenous  Once 01/24/15 1349 01/24/15 2301   01/21/15 2000   vancomycin (VANCOCIN) 1,500 mg in sodium chloride 0.9 % 500 mL IVPB  Status:  Discontinued     1,500 mg 250 mL/hr over 120 Minutes Intravenous Every 24 hours 01/21/15 1048 01/23/15 1726   01/20/15 2000  vancomycin (VANCOCIN) 1,250 mg in sodium chloride 0.9 % 250 mL IVPB  Status:  Discontinued     1,250 mg 166.7  mL/hr over 90 Minutes Intravenous Every 24 hours 01/19/15 1945 01/20/15 0843   01/20/15 2000  vancomycin (VANCOCIN) 1,250 mg in sodium chloride 0.9 % 250 mL IVPB  Status:  Discontinued     1,250 mg 166.7 mL/hr over 90 Minutes Intravenous Every 24 hours 01/20/15 1519 01/21/15 1048   01/20/15 0000  piperacillin-tazobactam (ZOSYN) IVPB 3.375 g  Status:  Discontinued     3.375 g 12.5 mL/hr over 240 Minutes Intravenous Every 8 hours 01/19/15 1946 01/29/15 0845   01/19/15 1715  vancomycin (VANCOCIN) 2,500 mg in sodium chloride 0.9 % 500 mL IVPB     2,500 mg 250 mL/hr over 120 Minutes Intravenous  Once 01/19/15 1707 01/19/15 2159   01/19/15 1715  piperacillin-tazobactam (ZOSYN) IVPB 3.375 g     3.375 g 100 mL/hr over 30 Minutes Intravenous  Once 01/19/15 1707 01/19/15 1900      Assessment & Plans: Status post interval lap chole 7/18 - complicated by postop hematoma/sepsis Readmitted 8/4 for sepsis, aki on ckd, acute on chronic anemia  NG output down, will clamp and monitor for 24 hours  Swallow test tomorrow - CCM will arrange  OOB, ambulate with PT  ?slow progress  Velora Heckler, MD, [facility] Office: [phone]   Driana Dazey Judie Petit 02/21/2015

## 2015-02-21 NOTE — Progress Notes (Signed)
eLink Physician-Brief Progress Note Patient Name: Larry [name] DOB: 07-29-1945 MRN: [id number]   Date of Service  02/21/2015  HPI/Events of Note  Anaerobic cx from 8/31 paracentesis with GNR. Covered with pip/tazo. Unlikely that this is pseudomonas, most likely E coli. Will defer double coverage, await speciation.   eICU Interventions       Intervention Category Intermediate Interventions: Infection - evaluation and management  Elzada Pytel S. 02/21/2015, 6:45 AM

## 2015-02-22 ENCOUNTER — Inpatient Hospital Stay (HOSPITAL_COMMUNITY): Payer: Medicare Other

## 2015-02-22 LAB — CBC
HCT: 27.6 % — ABNORMAL LOW (ref 39.0–52.0)
HEMOGLOBIN: 8.6 g/dL — AB (ref 13.0–17.0)
MCH: 29.2 pg (ref 26.0–34.0)
MCHC: 31.2 g/dL (ref 30.0–36.0)
MCV: 93.6 fL (ref 78.0–100.0)
PLATELETS: 327 10*3/uL (ref 150–400)
RBC: 2.95 MIL/uL — AB (ref 4.22–5.81)
RDW: 21 % — ABNORMAL HIGH (ref 11.5–15.5)
WBC: 10.1 10*3/uL (ref 4.0–10.5)

## 2015-02-22 LAB — GLUCOSE, CAPILLARY
GLUCOSE-CAPILLARY: 191 mg/dL — AB (ref 65–99)
GLUCOSE-CAPILLARY: 217 mg/dL — AB (ref 65–99)
GLUCOSE-CAPILLARY: 203 mg/dL — AB (ref 65–99)
GLUCOSE-CAPILLARY: 240 mg/dL — AB (ref 65–99)
Glucose-Capillary: 258 mg/dL — ABNORMAL HIGH (ref 65–99)
Glucose-Capillary: 261 mg/dL — ABNORMAL HIGH (ref 65–99)

## 2015-02-22 LAB — TYPE AND SCREEN
ABO/RH(D): O NEG
ANTIBODY SCREEN: NEGATIVE
UNIT DIVISION: 0

## 2015-02-22 LAB — RENAL FUNCTION PANEL
ANION GAP: 10 (ref 5–15)
Albumin: 1.9 g/dL — ABNORMAL LOW (ref 3.5–5.0)
Albumin: 2.1 g/dL — ABNORMAL LOW (ref 3.5–5.0)
Anion gap: 9 (ref 5–15)
BUN: 108 mg/dL — ABNORMAL HIGH (ref 6–20)
BUN: 103 mg/dL — AB (ref 6–20)
CALCIUM: 8 mg/dL — AB (ref 8.9–10.3)
CHLORIDE: 109 mmol/L (ref 101–111)
CO2: 21 mmol/L — AB (ref 22–32)
CO2: 21 mmol/L — AB (ref 22–32)
CREATININE: 2.92 mg/dL — AB (ref 0.61–1.24)
Calcium: 8.2 mg/dL — ABNORMAL LOW (ref 8.9–10.3)
Chloride: 108 mmol/L (ref 101–111)
Creatinine, Ser: 3.02 mg/dL — ABNORMAL HIGH (ref 0.61–1.24)
GFR calc non Af Amer: 20 mL/min — ABNORMAL LOW (ref >60)
GFR calc non Af Amer: 21 mL/min — ABNORMAL LOW (ref >60)
GFR, EST AFRICAN AMERICAN: 23 mL/min — AB (ref >60)
GFR, EST AFRICAN AMERICAN: 24 mL/min — AB (ref >60)
GLUCOSE: 275 mg/dL — AB (ref 65–99)
Glucose, Bld: 246 mg/dL — ABNORMAL HIGH (ref 65–99)
PHOSPHORUS: 4.3 mg/dL (ref 2.5–4.6)
POTASSIUM: 3.2 mmol/L — AB (ref 3.5–5.1)
Phosphorus: 3.1 mg/dL (ref 2.5–4.6)
Potassium: 3.8 mmol/L (ref 3.5–5.1)
SODIUM: 139 mmol/L (ref 135–145)
Sodium: 139 mmol/L (ref 135–145)

## 2015-02-22 LAB — CALCIUM, IONIZED: CALCIUM, IONIZED, SERUM: 4.6 mg/dL (ref 4.5–5.6)

## 2015-02-22 LAB — APTT: aPTT: 27 seconds (ref 24–37)

## 2015-02-22 LAB — MAGNESIUM: Magnesium: 1.7 mg/dL (ref 1.7–2.4)

## 2015-02-22 MED ORDER — POTASSIUM CHLORIDE 10 MEQ/50ML IV SOLN
10.0000 meq | INTRAVENOUS | Status: AC
  Administered 2015-02-22 (×4): 10 meq via INTRAVENOUS
  Filled 2015-02-22 (×5): qty 50

## 2015-02-22 MED ORDER — MAGNESIUM SULFATE IN D5W 10-5 MG/ML-% IV SOLN
1.0000 g | Freq: Once | INTRAVENOUS | Status: AC
  Administered 2015-02-22: 1 g via INTRAVENOUS
  Filled 2015-02-22: qty 100

## 2015-02-22 MED ORDER — TRACE MINERALS CR-CU-MN-SE-ZN 10-1000-500-60 MCG/ML IV SOLN
INTRAVENOUS | Status: AC
  Administered 2015-02-22: 18:00:00 via INTRAVENOUS
  Filled 2015-02-22: qty 1992

## 2015-02-22 MED ORDER — POTASSIUM CHLORIDE 10 MEQ/50ML IV SOLN
10.0000 meq | INTRAVENOUS | Status: AC
  Administered 2015-02-22 (×4): 10 meq via INTRAVENOUS
  Filled 2015-02-22 (×4): qty 50

## 2015-02-22 MED ORDER — FAT EMULSION 20 % IV EMUL
240.0000 mL | INTRAVENOUS | Status: AC
  Administered 2015-02-22: 240 mL via INTRAVENOUS
  Filled 2015-02-22: qty 250

## 2015-02-22 NOTE — Progress Notes (Signed)
PULMONARY / CRITICAL CARE MEDICINE   Name: Larry [name] MRN: [id number] DOB: April 12, 1946    ADMISSION DATE:  01/19/2015 CONSULTATION DATE:  8/4  REFERRING MD :  [name]  CHIEF COMPLAINT:  Septic shock   INITIAL PRESENTATION:  69 year old male w/ CAF on Elliquis who recently underwent lap chole on 7/14. Was discharged home w/drain. Drain removed about a week before presentation to ER. Admitted directly from [facility] on 8/3 w/ septic shock which was likely due to infected hematoma in the GB fossa. PCCM asked to see after new perc drain placed on 8/4, when he remained hypotensive.  Required CRRT 8/23 for AKI  Intubated 8/26 for desaturations, significant agitation, delirium  STUDIES:  CT abdomen/Pelvis W/ 7/28 - Gas & fluid at cholecystectomy site w/o discrete abscess. Min ascites. Small right pleural eff. CT abdomen/pelvis w/o 8/5 - Gas between liver & stomach. No additional free air. Increased flow void & high density layering c/w increasing hemorrhage as well as increased right pleural eff. CT abdomen/pelvis 8/18 >> 9.8 cm hematoma with gas in the gallbladder fossa, indwelling pigtail drainage catheter, 12.6 cm hematoma along the posterior R liver, mod ascites, mild wall thickening involving the R colon CT abdomen/pelvis 8/22 >> stable perihepatic hematoma, stable gallbladder fossa collection despite adequate drainage cath placement, significant free fluid within the abdomen/pelvis slightly increased from prior exam 8/31 CT abd >>persistent large fluid collection at the gallbladder fossa 11.7 x 9.0 x 8.5 cm previously 9.7 x 9.7 x 8.8 cm. Additional subhepatic collection anteromedial to gallbladder collection containing air and fluid, decreased in size now measuring 5.0 x 5.0 x 4.4 cm, Significant ascites  SIGNIFICANT EVENTS: Admitted 8/3 w/ abd pain and hypotension  8/04  hypotensive over-night. Went for [facility] drain of GB fossa. Found what looked like old infected hematoma. PCCM  asked to see post-op for shock 8/05  off pressors. WOB worse.  8/06  WOB & wheezing slightly worse. Progressing abdominal pain 8/07  Hgb declining w/ increasing intraperitoneal hemorrhage 8/07  Transfused 2u PRBCs 8/09  work of breathing still significant w/ marked upper airway component  8/10  breathing better. 1.6 liters negative. Slight bump in scr. Vanc stopped. Getting OOB for first time 8/11  eating regular diet. Up in chair. Feeling better.  Respiratory status improved.  PCCM s/o 8/14  Renal consulted for rising sr cr (to 3.60), baseline sr cr 1.1-1.5 (prior IV contrast 7/28).   8/14  Concern for yeast / bacterial UTI  8/15  ID consulted > abx narrowed to zosyn only, fluconazole for yeast  8/16  UOP increased on IV lasix 8/18  NPO, sr cr improving, CT abd as above  8/18  GB drain upsized per IR to 16 Fr 8/19  Abd pain, eating well.  Diarrhea after ensure.  To PO lasix.  Discussed gastrostomy tube with IR 8/22  Sr Cr rising, intermittent vomiting.  CT abd as above.  Increased confusion 8/23  HD cath inserted, CRRT initiated.  Gastrostomy tube held off due to CRRT & Hgb drop 8/24  WBC elevated, cultures repeated.   8/24  Paracentesis >> 150 ml "bloody bile" fluid removed, loculated and difficult to aspirate 8/25  Palliative care consulted, PCCM consulted.  Vomiting with NGT insertion.  8/26  Intubated early am(0200) for desaturations, significant agitation, delirium  8/30 CRRT stopped, bilious vomiting/secretions 8/31 Paracentesis >> 3.1 L 9/1 1 U PRBC 9/4- vaso re added, improved neo off  SUBJECTIVE:   Denies pain afebrile  Verbose, slept well  VITAL SIGNS: Temp:  [97.7 F (36.5 C)-98.6 F (37 C)] 98.4 F (36.9 C) (09/06 0800) Pulse Rate:  [74-95] 86 (09/06 0800) Resp:  [19-32] 22 (09/06 0800) BP: (101-136)/(41-74) 132/74 mmHg (09/06 0800) SpO2:  [92 %-99 %] 93 % (09/06 0858) Weight:  [112.4 kg (247 lb 12.8 oz)] 112.4 kg (247 lb 12.8 oz) (09/06 0322)  HEMODYNAMICS:      VENTILATOR SETTINGS:     INTAKE / OUTPUT:  Intake/Output Summary (Last 24 hours) at 02/22/15 1119 Last data filed at 02/22/15 0930  Gross per 24 hour  Intake 1818.57 ml  Output   2085 ml  Net -266.43 ml    PHYSICAL EXAMINATION: General:  Acutely ill adult male , no distress Neuro:  Interactive, nonfocal, oriented HEENT:  HD cath site clean Cardiovascular:  s1s2 rrr, no m/r/g Lungs:  Coughing, scattered ronchi Abdomen: Hypoactive BS. Non- tender, drain in place, NGT  Musculoskeletal:  No rash, joint inflammation Skin:  Normal edema legs  LABS:  CBC  Recent Labs Lab 02/20/15 0450 02/21/15 0555 02/22/15 0515  WBC 12.3* 10.5 10.1  HGB 8.0* 6.8* 8.6*  HCT 24.9* 21.5* 27.6*  PLT 281 298 327   Coag's  Recent Labs Lab 02/20/15 0450 02/21/15 0555 02/22/15 0515  APTT 37 31 27   BMET  Recent Labs Lab 02/21/15 0555 02/21/15 1730 02/22/15 0515  NA 138 138 139  K 3.2* 3.0* 3.2*  CL 106 107 108  CO2 23 22 21*  BUN 102* 100* 108*  CREATININE 3.28* 3.22* 3.02*  GLUCOSE 207* 176* 275*   Electrolytes  Recent Labs Lab 02/20/15 1700  02/21/15 0555 02/21/15 1730 02/22/15 0515  CALCIUM  --   < > 7.9* 7.9* 8.0*  MG 1.8  --  1.9  --  1.7  PHOS  --   --  4.8* 4.4 4.3  < > = values in this interval not displayed. Sepsis Markers No results for input(s): LATICACIDVEN, PROCALCITON, O2SATVEN in the last 168 hours.   ABG No results for input(s): PHART, PCO2ART, PO2ART in the last 168 hours. Liver Enzymes  Recent Labs Lab 02/17/15 0500  02/21/15 0555 02/21/15 1730 02/22/15 0515  AST 47*  --  29  --   --   ALT 26  --  18  --   --   ALKPHOS 136*  --  113  --   --   BILITOT 1.7*  --  1.2  --   --   ALBUMIN 1.8*  < > 2.0* 2.0* 1.9*  < > = values in this interval not displayed.   Cardiac Enzymes No results for input(s): TROPONINI, PROBNP in the last 168 hours.   Glucose  Recent Labs Lab 02/21/15 1147 02/21/15 1538 02/21/15 1957 02/21/15 2300  02/22/15 0319 02/22/15 0746  GLUCAP 168* 155* 170* 191* 258* 217*    Imaging Dg Chest Port 1 View  02/22/2015   CLINICAL DATA:  Cough.  Shortness of breath.  EXAM: PORTABLE CHEST - 1 VIEW  COMPARISON:  02/21/2015.  FINDINGS: NG tube and right PICC line in stable position. Cardiomegaly with normal pulmonary vascularity. Low lung volumes with bibasilar atelectasis. No pleural effusion or pneumothorax. Degenerative changes both shoulders.  IMPRESSION: 1. Lines and tubes in stable position. 2. Low lung volumes with bibasilar atelectasis. 3. Stable cardiomegaly.   Electronically Signed   By: Maisie Fus  Register   On: 02/22/2015 07:15    ASSESSMENT / PLAN:  PULMONARY A: ETT 8/26 >> 8/29 Acute  Hypoxic Respiratory Failure - in the setting of significant Agitation, renal fx, delirium  - resolved Chronically elevated right HD Right pleural effusion - progessing on Abd CT 8/6  P:   Xopenex neb q6hr PRN IS   CARDIOVASCULAR CVL Right IJ 8/4 >> 8/22 RUE PICC 8/22 >>  A:  Severe sepsis/septic shock - remains Hypotension - 8/26, started on neo post intubation, smouldering sepsis Hx PAF - CHADVASC score 4; currently NSR  Prior NSTEMI  Adrenal insuff P:  Goal MAP 55 with good MS Dc  stress steroids as shock remains  RENAL A:   AKI- in setting of septic shock, prior hypotension/ATN Non oliguric now Hypo k  P:   Chem in am  HD cath out, hope for full renal recovery, but slow  GASTROINTESTINAL A:   Infected hematoma s/p cholecystectomy - s/p CT guided Perc drain with subsequent upsize to 16 Fr, Paracentesis 8/31 c/w peritonitis GERD - worse 8/5 w/ associated UAW wheeze Intra-abdominal hemorrhage / hematoma  Nausea / Vomiting - concern for ileus (8/25) Protein Calorie Malnutrition   P:   TNA per pharmacy  Protonix QD swallow eval, if pass start clears dc ngt per surgery  HEMATOLOGIC A:   Anemia - in setting of intra-abdominal hemorrhage (8/4).  S/p 2 u PRBC on 8/4,2u 8/7  , 1 U  9/1, 1 unit 9/5 Dilutional anemia contribution P:  Limit phlebotomy when able SCDs   INFECTIOUS A:   Severe sepsis/septic shock - presume source is infected hematoma from recent Cholecystectomy. Shock resolved.  S/p Perc GB fossa site drain 8/4 - minimal serous output High Risk for Fungemia P:   ID following, appreciate input   Abscess 8/4>>>neg BCX2 8/4>>>neg  Peritoneal fluid GS 8/24 >> abundant WBC, no organisms seen Peritoneal fluid culture 8/24 >> ng BCx2 8/25 >> ng Urine 8/14 >> yeast  Ascitic fluid 8/31 >GNR >> c diff neg 9/1  Meropenem 8/24 >>  Fluconazole 8/15 >> 8/23 anigulofungin 8/26>>> vanc 8/28>>>8/31  Per ID, hope to stop anti fungal  ENDOCRINE A: DM - CBG's better  Controlled, actually low improved AI P:   SSI Q4 lantus 20 insulin in TNA  NEUROLOGIC A:  Delirium  Critical Illness Deconditioning  P:   PT    FAMILY  - Updates:  D-in law 8/30  - Inter-disciplinary family meet or Palliative Care meeting due by:  Ongoing, family wants to continue full aggressive measures.  Hopeful that patient will be able to recover.     Summary - Improving, off pressors, hoping for renal recovery, peritonitis resolving , lwo threshold to rescan abdomen for collections  The patient is critically ill with multiple organ systems failure and requires high complexity decision making for assessment and support, frequent evaluation and titration of therapies, application of advanced monitoring technologies and extensive interpretation of multiple databases. Critical Care Time devoted to patient care services described in this note independent of APP time is 31 minutes.   Cyril Mourning MD. Tonny Bollman.  Pulmonary & Critical care Pager [phone] If no response call [phone]

## 2015-02-22 NOTE — Progress Notes (Signed)
Date:  Sept.6, 2016 U.R. performed for needs and level of care. Will continue to follow for Case Management needs.  Rhonda Davis, RN, BSN, CCM   [phone] 
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
## 2015-02-22 NOTE — Progress Notes (Signed)
Subjective: Very talkative this am. Told me about his weekend. Hungry. Wants to get up and move around. Denies n/v/abd pain. Tolerated NG clamp  Objective: Vital signs in last 24 hours: Temp:  [97.7 F (36.5 C)-98.6 F (37 C)] 97.8 F (36.6 C) (09/06 0322) Pulse Rate:  [74-95] 94 (09/06 0600) Resp:  [19-32] 30 (09/06 0600) BP: (101-136)/(37-71) 126/71 mmHg (09/06 0600) SpO2:  [92 %-99 %] 95 % (09/06 0600) Weight:  [112.4 kg (247 lb 12.8 oz)] 112.4 kg (247 lb 12.8 oz) (09/06 0322) Last BM Date: 02/21/15  Intake/Output from previous day: 09/05 0701 - 09/06 0700 In: 2249.9 [I.V.:131.3; Blood:360; NG/GT:30; IV Piggyback:724.9; TPN:1003.7] Out: 2135 [Urine:2135] Intake/Output this shift:    Alert, animated. Oriented x 3 - 16, sept 6, [place], [place], [place] Is about 750 Soft, nt, nd; drain - old blood  Lab Results:   Recent Labs  02/21/15 0555 02/22/15 0515  WBC 10.5 10.1  HGB 6.8* 8.6*  HCT 21.5* 27.6*  PLT 298 327   BMET  Recent Labs  02/21/15 1730 02/22/15 0515  NA 138 139  K 3.0* 3.2*  CL 107 108  CO2 22 21*  GLUCOSE 176* 275*  BUN 100* 108*  CREATININE 3.22* 3.02*  CALCIUM 7.9* 8.0*   PT/INR No results for input(s): LABPROT, INR in the last 72 hours. ABG No results for input(s): PHART, HCO3 in the last 72 hours.  Invalid input(s): PCO2, PO2  Studies/Results: Dg Chest Port 1 View  02/22/2015   CLINICAL DATA:  Cough.  Shortness of breath.  EXAM: PORTABLE CHEST - 1 VIEW  COMPARISON:  02/21/2015.  FINDINGS: NG tube and right PICC line in stable position. Cardiomegaly with normal pulmonary vascularity. Low lung volumes with bibasilar atelectasis. No pleural effusion or pneumothorax. Degenerative changes both shoulders.  IMPRESSION: 1. Lines and tubes in stable position. 2. Low lung volumes with bibasilar atelectasis. 3. Stable cardiomegaly.   Electronically Signed   By: Maisie Fus  Register   On: 02/22/2015 07:15   Dg Chest Port 1 View  02/21/2015    CLINICAL DATA:  Productive cough for 1 day.  Shortness of breath.  EXAM: PORTABLE CHEST - 1 VIEW  COMPARISON:  02/15/2015.  FINDINGS: Poor inspiration. The cardiac silhouette remains borderline enlarged. The right PICC is unchanged. Stable elevation of the right hemidiaphragm. Minimal right basilar atelectasis. Clear left lung. Mildly prominent interstitial markings without significant change. Thoracic spine degenerative changes. Cholecystectomy clips. Right upper quadrant abdominal pigtail catheter. Nasogastric tube tip in the proximal stomach. Bilateral shoulder degenerative changes, greater on the right.  IMPRESSION: 1. Poor inspiration with minimal right basilar atelectasis. 2. Mild chronic interstitial lung disease.   Electronically Signed   By: Beckie Salts M.D.   On: 02/21/2015 09:24    Anti-infectives: Anti-infectives    Start     Dose/Rate Route Frequency Ordered Stop   02/17/15 1400  vancomycin (VANCOCIN) IVPB 1000 mg/200 mL premix     1,000 mg 200 mL/hr over 60 Minutes Intravenous  Once 02/17/15 1320 02/17/15 1502   02/16/15 2200  piperacillin-tazobactam (ZOSYN) IVPB 3.375 g     3.375 g 12.5 mL/hr over 240 Minutes Intravenous 3 times per day 02/16/15 1603     02/15/15 2200  meropenem (MERREM) 500 mg in sodium chloride 0.9 % 50 mL IVPB  Status:  Discontinued     500 mg 100 mL/hr over 30 Minutes Intravenous Every 12 hours 02/15/15 1415 02/16/15 1533   02/14/15 1200  vancomycin (VANCOCIN) IVPB 1000 mg/200 mL  premix  Status:  Discontinued     1,000 mg 200 mL/hr over 60 Minutes Intravenous Every 24 hours 02/13/15 0920 02/16/15 0706   02/13/15 1000  vancomycin (VANCOCIN) 1,750 mg in sodium chloride 0.9 % 500 mL IVPB     1,750 mg 250 mL/hr over 120 Minutes Intravenous  Once 02/13/15 0836 02/13/15 1210   02/12/15 1630  meropenem (MERREM) 500 mg in sodium chloride 0.9 % 50 mL IVPB  Status:  Discontinued     500 mg 100 mL/hr over 30 Minutes Intravenous Every 6 hours 02/12/15 1106 02/15/15  1415   02/11/15 1800  anidulafungin (ERAXIS) 100 mg in sodium chloride 0.9 % 100 mL IVPB     100 mg over 90 Minutes Intravenous Every 24 hours 02/10/15 1744     02/10/15 1730  anidulafungin (ERAXIS) 200 mg in sodium chloride 0.9 % 200 mL IVPB     200 mg over 180 Minutes Intravenous  Once 02/10/15 1720 02/10/15 2230   02/10/15 0200  meropenem (MERREM) 500 mg in sodium chloride 0.9 % 50 mL IVPB  Status:  Discontinued     500 mg 100 mL/hr over 30 Minutes Intravenous Every 8 hours 02/09/15 1808 02/12/15 1106   02/10/15 0000  meropenem (MERREM) 500 mg in sodium chloride 0.9 % 50 mL IVPB  Status:  Discontinued     500 mg 100 mL/hr over 30 Minutes Intravenous 4 times per day 02/09/15 1655 02/09/15 1808   02/09/15 1800  meropenem (MERREM) 1 g in sodium chloride 0.9 % 100 mL IVPB     1 g 200 mL/hr over 30 Minutes Intravenous  Once 02/09/15 1655 02/09/15 1818   02/08/15 1800  piperacillin-tazobactam (ZOSYN) IVPB 3.375 g  Status:  Discontinued     3.375 g 100 mL/hr over 30 Minutes Intravenous 4 times per day 02/08/15 1403 02/09/15 1655   02/07/15 1400  piperacillin-tazobactam (ZOSYN) IVPB 2.25 g  Status:  Discontinued     2.25 g 100 mL/hr over 30 Minutes Intravenous 3 times per day 02/07/15 0750 02/08/15 1403   02/04/15 1200  piperacillin-tazobactam (ZOSYN) IVPB 2.25 g  Status:  Discontinued     2.25 g 100 mL/hr over 30 Minutes Intravenous 4 times per day 02/04/15 0859 02/04/15 0905   02/04/15 1200  piperacillin-tazobactam (ZOSYN) IVPB 3.375 g  Status:  Discontinued     3.375 g 12.5 mL/hr over 240 Minutes Intravenous Every 8 hours 02/04/15 0905 02/07/15 0750   02/02/15 2000  fluconazole (DIFLUCAN) tablet 100 mg     100 mg Oral Every 24 hours 02/02/15 1908 02/08/15 2041   02/02/15 1800  fluconazole (DIFLUCAN) tablet 100 mg  Status:  Discontinued     100 mg Oral Daily 02/02/15 1545 02/02/15 1901   01/31/15 1000  fluconazole (DIFLUCAN) tablet 100 mg  Status:  Discontinued     100 mg Oral Daily  01/31/15 0727 02/02/15 1313   01/29/15 1700  piperacillin-tazobactam (ZOSYN) IVPB 2.25 g  Status:  Discontinued     2.25 g 100 mL/hr over 30 Minutes Intravenous Every 8 hours 01/29/15 0845 02/04/15 0859   01/24/15 2200  vancomycin (VANCOCIN) IVPB 1000 mg/200 mL premix     1,000 mg 200 mL/hr over 60 Minutes Intravenous  Once 01/24/15 1349 01/24/15 2301   01/21/15 2000  vancomycin (VANCOCIN) 1,500 mg in sodium chloride 0.9 % 500 mL IVPB  Status:  Discontinued     1,500 mg 250 mL/hr over 120 Minutes Intravenous Every 24 hours 01/21/15 1048 01/23/15 1726  01/20/15 2000  vancomycin (VANCOCIN) 1,250 mg in sodium chloride 0.9 % 250 mL IVPB  Status:  Discontinued     1,250 mg 166.7 mL/hr over 90 Minutes Intravenous Every 24 hours 01/19/15 1945 01/20/15 0843   01/20/15 2000  vancomycin (VANCOCIN) 1,250 mg in sodium chloride 0.9 % 250 mL IVPB  Status:  Discontinued     1,250 mg 166.7 mL/hr over 90 Minutes Intravenous Every 24 hours 01/20/15 1519 01/21/15 1048   01/20/15 0000  piperacillin-tazobactam (ZOSYN) IVPB 3.375 g  Status:  Discontinued     3.375 g 12.5 mL/hr over 240 Minutes Intravenous Every 8 hours 01/19/15 1946 01/29/15 0845   01/19/15 1715  vancomycin (VANCOCIN) 2,500 mg in sodium chloride 0.9 % 500 mL IVPB     2,500 mg 250 mL/hr over 120 Minutes Intravenous  Once 01/19/15 1707 01/19/15 2159   01/19/15 1715  piperacillin-tazobactam (ZOSYN) IVPB 3.375 g     3.375 g 100 mL/hr over 30 Minutes Intravenous  Once 01/19/15 1707 01/19/15 1900      Assessment/Plan: Mentation is impressive given bun/cr.  Good uop.  Off of pressors Will need to monitor bun Dc ng tube Swallow evaluation. If passes ok to start diet Pt/ot pulm toilet Paracentesis from last week 1 culture showed GNR. Will defer to ID  [name]. [name], MD, FACS General, Bariatric, & Minimally Invasive Surgery [facility], [place]   LOS: 34 days    [name] 02/22/2015

## 2015-02-22 NOTE — Progress Notes (Signed)
Patient refused breathing tx

## 2015-02-22 NOTE — Progress Notes (Signed)
Physical Therapy Treatment Patient Details Name: Larry [name] MRN: [id number] DOB: [birth year] Today's Date: 02/22/2015    History of Present Illness 69 year old male w/ CAF on Elliquis who recently underwent lap chole on 7/14. Was discharged home w/drain. Drain removed about a week before presentation to ER. Admitted directly from [facility] on 8/3 w/ septic shock which was likely due to infected hematoma in the GB fossa.  new perc drain placed on 8/4, when he remained hypotensive. Required HD  but now off with improving kidney function. On ventilator 8/26-8/29.    PT Comments    Pt looking much better.  OOB in recliner with nursing on RA and NG tube gone.  Amb pt in hallway a greater distance still using EVA walker for increased support and due to extended length of stay.  Tolerated a total of 70 feet.  Avg HR 98 and RA 99%.    Follow Up Recommendations  SNF     Equipment Recommendations       Recommendations for Other Services       Precautions / Restrictions Precautions Precautions: Fall Precaution Comments: JP drain on R, monitor HR, multiple lines Restrictions Weight Bearing Restrictions: No    Mobility  Bed Mobility               General bed mobility comments: Pt OOB in recliner  Transfers Overall transfer level: Needs assistance Equipment used: None Transfers: Sit to/from Stand Sit to Stand: Mod assist;+2 safety/equipment;+2 physical assistance         General transfer comment: 25% VC's on proper hand placement and increased time/assist.    Ambulation/Gait Ambulation/Gait assistance: +2 physical assistance;+2 safety/equipment;Mod assist Ambulation Distance (Feet): 70 Feet (50', 20' one sitting rest break) Assistive device: Bilateral platform walker (EVA walker) Gait Pattern/deviations: Step-to pattern;Step-through pattern;Decreased step length - right;Decreased step length - left;Decreased stance time - right;Trunk flexed Gait velocity:  decreased   General Gait Details: Very weak.  Used B platform EVA walker for increased support and to increase activity tolerance/gait distance. tolerated increased distance.  very weak due to medical and extended length of stay.     Stairs            Wheelchair Mobility    Modified Rankin (Stroke Patients Only)       Balance                                    Cognition Arousal/Alertness: Awake/alert Behavior During Therapy: WFL for tasks assessed/performed Overall Cognitive Status: Within Functional Limits for tasks assessed                 General Comments: much better spirits    Exercises      General Comments        Pertinent Vitals/Pain Pain Assessment: No/denies pain    Home Living                      Prior Function            PT Goals (current goals can now be found in the care plan section) Progress towards PT goals: Progressing toward goals    Frequency  Min 3X/week    PT Plan Current plan remains appropriate    Co-evaluation             End of Session Equipment Utilized During Treatment: Gait belt Activity  Tolerance: Patient tolerated treatment well Patient left: in chair;with call bell/phone within reach     Time: 1005-1035 PT Time Calculation (min) (ACUTE ONLY): 30 min  Charges:  $Gait Training: 8-22 mins $Therapeutic Activity: 8-22 mins                    G Codes:      Felecia Shelling  PTA [facility]  Acute  Rehab Pager      [phone]

## 2015-02-22 NOTE — Progress Notes (Signed)
eLink Physician-Brief Progress Note Patient Name: Larry [name] DOB: 06/03/1946 MRN: [id number]   Date of Service  02/22/2015  HPI/Events of Note  Hypokalemia  eICU Interventions  Potassium replaced     Intervention Category Intermediate Interventions: Electrolyte abnormality - evaluation and management  Zakkery Dorian 02/22/2015, 6:03 AM

## 2015-02-22 NOTE — Progress Notes (Signed)
PARENTERAL NUTRITION CONSULT NOTE   Pharmacy Consult for TPN Indication: Prolonged ileus  No Known Allergies  Patient Measurements: Height: 5\' 6"  (167.6 cm) Weight: 247 lb 12.8 oz (112.4 kg) IBW/kg (Calculated) : 63.8 Adjusted Body Weight: 83.2 kg (9/6)  Vital Signs: Temp: 97.8 F (36.6 C) (09/06 0322) Temp Source: Oral (09/06 0322) BP: 126/71 mmHg (09/06 0600) Pulse Rate: 94 (09/06 0600) Intake/Output from previous day: 09/05 0701 - 09/06 0700 In: 2249.9 [I.V.:131.3; Blood:360; NG/GT:30; IV Piggyback:724.9; TPN:1003.7] Out: 2135 [Urine:2135]  Labs:  Recent Labs  02/20/15 0450 02/21/15 0555 02/22/15 0515  WBC 12.3* 10.5 10.1  HGB 8.0* 6.8* 8.6*  HCT 24.9* 21.5* 27.6*  PLT 281 298 327  APTT 37 31 27     Recent Labs  02/20/15 1700  02/21/15 0555 02/21/15 1730 02/22/15 0515  NA  --   < > 138 138 139  K  --   < > 3.2* 3.0* 3.2*  CL  --   < > 106 107 108  CO2  --   < > 23 22 21*  GLUCOSE  --   < > 207* 176* 275*  BUN  --   < > 102* 100* 108*  CREATININE  --   < > 3.28* 3.22* 3.02*  CALCIUM  --   < > 7.9* 7.9* 8.0*  MG 1.8  --  1.9  --  1.7  PHOS  --   --  4.8* 4.4 4.3  PROT  --   --  6.1*  --   --   ALBUMIN  --   --  2.0* 2.0* 1.9*  AST  --   --  29  --   --   ALT  --   --  18  --   --   ALKPHOS  --   --  113  --   --   BILITOT  --   --  1.2  --   --   PREALBUMIN  --   --  17.5*  --   --   TRIG  --   --  183*  --   --   < > = values in this interval not displayed. Estimated Creatinine Clearance: 27.5 mL/min (by C-G formula based on Cr of 3.02).    Recent Labs  02/21/15 1957 02/21/15 2300 02/22/15 0319  GLUCAP 170* 191* 258*   Insulin Requirements: 31 units of Novolog SSI + Lantus 20 units + 30 units regular insulin provided by TPN  Current Nutrition: NPO, unable to tolerate TF   IVF: NS KVO  Central access: PICC  TPN start date:  8/22  ASSESSMENT                                                                                                           HPI: 7 YOM presents with worsening abdominal pain on 8/3.  Recent cholecystectomy on 01/04/2015 with drain removed 2 weeks prior to admission. Found to have GB fossa fluid collection (infected hematoma) and abscess. IR placed GB fossa drain.  Pharmacy asked  to start TPN 8/22 for prolonged ileus and interment vomiting prohibiting use of enteral nutrition.    Significant events:  8/3 septic shock from infected GB fossa 8/22 orders to start TPN 8/23 Start CRRT 8/25 intubated 8/29 extubated 8/30 CRRT stopped 9/2: Reglan restarted.  Having multiple BMs.  9/3-9/4: NG output still high.  Holding on clamping trials for now.  9/6: tolerated NG clamp, no N/V/abd pain.  NG tube d/c.  Diet order pending swallow eval.    Today, 02/22/2015:    Glucose - uncontrolled with wide range of CBGs, 155-258.  Lantus increased 9/5 + SSI + insulin in TPN.  Patient on Lantus 40 units daily PTA. Stress dose steroids tapered off, but restarted 9/3.    Electrolytes:  Na WNL   Persistent hypokalemia requiring daily repletion  Mg 1.7, below goal of >2, last replaced 1g on 9/4  Phos decreased to WNL after removing electrolytes from TPN.  CorrCa WNL. CaPhos product 41.  Renal - nonoliguric AKI. CRRT 8/23-8/30.   24h I/O = +114 mL  SCr improving slowly, but BUN remains elevated  Urine output documented as 0.79 mL/kg/hr yesterday, continues to make good urine today.  LFTs - AST/ALT, Alk Phos, Tbili WNL (9/5)  TGs - 155 (8/23), 175 (8/29), 183 (9/5)  Prealbumin - 5.3 (8/22), 10 (8/29), 17.5 (9/5)  NUTRITIONAL GOALS                                                                                             Updated RD recs 8/31 (off CRRT): 1950-2150 kcals, protein 95-105 g  Clinimix 5/15 at a goal rate of 75ml/hr + 20% fat emulsion at 11ml/hr to provide: 100 g/day protein, 1894 Kcal/day.   PLAN                                                                                                               Magnesium 1g IV once  At 1800 today:  Continue Clinimix 5/15 (WITHOUT lytes) at 83 ml/hr.    Protein supplementation at ~1.3 g/kg/day of adjusted body weight.  Concerned that reducing protein supplementation further would not benefit this acutely ill patient.  BUN may rise regardless of exogenous protein supplementation in this catabolic patient; however, if BUN continues to increase, may consider reducing protein provided by TPN.   Continue regular insulin in TPN: Maintain 30 units of regular insulin over 24 hour infusion.   Continue 20% lipid emulsion at 10 ml/hr.  Held lipids for first week of ICU admission, resumed 8/30.   TPN to contain standard multivitamins and trace elements.  Continue to provide all trace elements for now.  Consider removing chromium and  selenium if SCr does not show improvement over next several days and remains on TPN.  Monitor electrolytes closely.  Consider adding electrolytes back to TPN if continued.  Potassium chloride 34mEq q1h x 4 per MD  Potassium chloride 46mEq q1h x 4 per PharmD  Check renal panel this afternoon and replete again as needed  CBG monitoring, SSI q4h  Daily renal function panel, Mg and Phos ordered per MD   F/u results of swallow evaluation and diet orders.  Wean TPN when appropriate based on enteral intake.  Gretta Arab PharmD, BCPS Pager [phone] 02/22/2015 8:55 AM

## 2015-02-22 NOTE — Progress Notes (Addendum)
CSW following to assist with d/c planning. PN reviewed. PT continues to recommend SNF placement at d/c. SNF search initiated in case pt/family accepts  this plan. Family had requested SNF placement earlier in pt's hospitalization. CSW will provide bed offers when available, if required.   Cori Razor LCSW [phone]
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
## 2015-02-23 DIAGNOSIS — E876 Hypokalemia: Secondary | ICD-10-CM

## 2015-02-23 DIAGNOSIS — B961 Klebsiella pneumoniae [K. pneumoniae] as the cause of diseases classified elsewhere: Secondary | ICD-10-CM

## 2015-02-23 LAB — RENAL FUNCTION PANEL
ANION GAP: 10 (ref 5–15)
Albumin: 2 g/dL — ABNORMAL LOW (ref 3.5–5.0)
BUN: 99 mg/dL — ABNORMAL HIGH (ref 6–20)
CALCIUM: 8.3 mg/dL — AB (ref 8.9–10.3)
CO2: 21 mmol/L — AB (ref 22–32)
CREATININE: 2.73 mg/dL — AB (ref 0.61–1.24)
Chloride: 110 mmol/L (ref 101–111)
GFR, EST AFRICAN AMERICAN: 26 mL/min — AB (ref >60)
GFR, EST NON AFRICAN AMERICAN: 22 mL/min — AB (ref >60)
Glucose, Bld: 152 mg/dL — ABNORMAL HIGH (ref 65–99)
Phosphorus: 3.1 mg/dL (ref 2.5–4.6)
Potassium: 3.3 mmol/L — ABNORMAL LOW (ref 3.5–5.1)
SODIUM: 141 mmol/L (ref 135–145)

## 2015-02-23 LAB — CBC
HCT: 28.3 % — ABNORMAL LOW (ref 39.0–52.0)
Hemoglobin: 8.9 g/dL — ABNORMAL LOW (ref 13.0–17.0)
MCH: 29.4 pg (ref 26.0–34.0)
MCHC: 31.4 g/dL (ref 30.0–36.0)
MCV: 93.4 fL (ref 78.0–100.0)
PLATELETS: 371 10*3/uL (ref 150–400)
RBC: 3.03 MIL/uL — AB (ref 4.22–5.81)
RDW: 20.6 % — AB (ref 11.5–15.5)
WBC: 11.8 10*3/uL — AB (ref 4.0–10.5)

## 2015-02-23 LAB — GLUCOSE, CAPILLARY
GLUCOSE-CAPILLARY: 171 mg/dL — AB (ref 65–99)
GLUCOSE-CAPILLARY: 181 mg/dL — AB (ref 65–99)
Glucose-Capillary: 171 mg/dL — ABNORMAL HIGH (ref 65–99)
Glucose-Capillary: 196 mg/dL — ABNORMAL HIGH (ref 65–99)
Glucose-Capillary: 276 mg/dL — ABNORMAL HIGH (ref 65–99)
Glucose-Capillary: 180 mg/dL — ABNORMAL HIGH (ref 65–99)

## 2015-02-23 LAB — APTT: APTT: 29 s (ref 24–37)

## 2015-02-23 LAB — CALCIUM, IONIZED: Calcium, Ionized, Serum: 4.7 mg/dL (ref 4.5–5.6)

## 2015-02-23 LAB — CULTURE, BODY FLUID W GRAM STAIN -BOTTLE

## 2015-02-23 LAB — MAGNESIUM: Magnesium: 1.8 mg/dL (ref 1.7–2.4)

## 2015-02-23 MED ORDER — POTASSIUM CHLORIDE 10 MEQ/50ML IV SOLN
10.0000 meq | INTRAVENOUS | Status: AC
  Administered 2015-02-23 (×4): 10 meq via INTRAVENOUS

## 2015-02-23 MED ORDER — TRACE MINERALS CR-CU-MN-SE-ZN 10-1000-500-60 MCG/ML IV SOLN
INTRAVENOUS | Status: AC
  Administered 2015-02-23: 17:00:00 via INTRAVENOUS
  Filled 2015-02-23: qty 960

## 2015-02-23 MED ORDER — ACETAMINOPHEN 325 MG PO TABS
650.0000 mg | ORAL_TABLET | Freq: Four times a day (QID) | ORAL | Status: DC | PRN
  Administered 2015-02-24: 650 mg via ORAL
  Filled 2015-02-23: qty 2

## 2015-02-23 MED ORDER — METRONIDAZOLE 500 MG PO TABS
500.0000 mg | ORAL_TABLET | Freq: Four times a day (QID) | ORAL | Status: DC
  Administered 2015-02-23 – 2015-02-26 (×13): 500 mg via ORAL
  Filled 2015-02-23 (×13): qty 1

## 2015-02-23 MED ORDER — POTASSIUM CHLORIDE 10 MEQ/50ML IV SOLN
10.0000 meq | INTRAVENOUS | Status: AC
  Administered 2015-02-23 (×4): 10 meq via INTRAVENOUS
  Filled 2015-02-23 (×2): qty 50

## 2015-02-23 MED ORDER — MAGNESIUM SULFATE IN D5W 10-5 MG/ML-% IV SOLN
1.0000 g | Freq: Once | INTRAVENOUS | Status: AC
  Administered 2015-02-23: 1 g via INTRAVENOUS
  Filled 2015-02-23: qty 100

## 2015-02-23 MED ORDER — INFLUENZA VAC SPLIT QUAD 0.5 ML IM SUSY
0.5000 mL | PREFILLED_SYRINGE | INTRAMUSCULAR | Status: AC
  Administered 2015-02-26: 0.5 mL via INTRAMUSCULAR
  Filled 2015-02-23 (×3): qty 0.5

## 2015-02-23 MED ORDER — FAT EMULSION 20 % IV EMUL
240.0000 mL | INTRAVENOUS | Status: AC
  Administered 2015-02-23: 240 mL via INTRAVENOUS
  Filled 2015-02-23: qty 250

## 2015-02-23 MED ORDER — DEXTROSE 5 % IV SOLN
2.0000 g | INTRAVENOUS | Status: DC
  Administered 2015-02-23 – 2015-02-26 (×4): 2 g via INTRAVENOUS
  Filled 2015-02-23 (×4): qty 2

## 2015-02-23 MED ORDER — HYDROCORTISONE NA SUCCINATE PF 100 MG IJ SOLR
50.0000 mg | Freq: Two times a day (BID) | INTRAMUSCULAR | Status: DC
  Administered 2015-02-23 – 2015-02-24 (×2): 50 mg via INTRAVENOUS
  Filled 2015-02-23 (×2): qty 2

## 2015-02-23 NOTE — Progress Notes (Signed)
PARENTERAL NUTRITION CONSULT NOTE   Pharmacy Consult for TPN Indication: Prolonged ileus  No Known Allergies  Patient Measurements: Height: 5\' 6"  (167.6 cm) Weight: 248 lb 0.3 oz (112.5 kg) IBW/kg (Calculated) : 63.8 Adjusted Body Weight: 83.2 kg (9/6)  Vital Signs: Temp: 97.6 F (36.4 C) (09/07 0400) Temp Source: Oral (09/07 0400) BP: 160/68 mmHg (09/07 0600) Pulse Rate: 93 (09/07 0600) Intake/Output from previous day: 09/06 0701 - 09/07 0700 In: 3202.5 [P.O.:240; I.V.:290; IV Piggyback:550; TPN:2107.5] Out: 2460 [Urine:2450; Drains:10]  Labs:  Recent Labs  02/21/15 0555 02/22/15 0515 02/23/15 0550  WBC 10.5 10.1 11.8*  HGB 6.8* 8.6* 8.9*  HCT 21.5* 27.6* 28.3*  PLT 298 327 371  APTT 31 27 29      Recent Labs  02/21/15 0555  02/22/15 0515 02/22/15 2018 02/23/15 0550  NA 138  < > 139 139 141  K 3.2*  < > 3.2* 3.8 3.3*  CL 106  < > 108 109 110  CO2 23  < > 21* 21* 21*  GLUCOSE 207*  < > 275* 246* 152*  BUN 102*  < > 108* 103* 99*  CREATININE 3.28*  < > 3.02* 2.92* 2.73*  CALCIUM 7.9*  < > 8.0* 8.2* 8.3*  MG 1.9  --  1.7  --  1.8  PHOS 4.8*  < > 4.3 3.1 3.1  PROT 6.1*  --   --   --   --   ALBUMIN 2.0*  < > 1.9* 2.1* 2.0*  AST 29  --   --   --   --   ALT 18  --   --   --   --   ALKPHOS 113  --   --   --   --   BILITOT 1.2  --   --   --   --   PREALBUMIN 17.5*  --   --   --   --   TRIG 183*  --   --   --   --   < > = values in this interval not displayed. Estimated Creatinine Clearance: 30.5 mL/min (by C-G formula based on Cr of 2.73).    Recent Labs  02/22/15 1940 02/22/15 2338 02/23/15 0409  GLUCAP 261* 171* 171*   Insulin Requirements: 40 units of Novolog SSI + Lantus 20 units + 30 units regular insulin provided by TPN  Current Nutrition: Advanced to FLD on 9/7 (no N/V with CLD on 9/6)  IVF: NS KVO  Central access: PICC  TPN start date:  8/22  ASSESSMENT                                                                                                           HPI: 34 YOM presents with worsening abdominal pain on 8/3.  Recent cholecystectomy on 01/04/2015 with drain removed 2 weeks prior to admission. Found to have GB fossa fluid collection (infected hematoma) and abscess. IR placed GB fossa drain.  Pharmacy asked to start TPN 8/22 for prolonged ileus and interment vomiting prohibiting use of enteral nutrition.  Significant events:  8/3 septic shock from infected GB fossa 8/22 orders to start TPN 8/23 Start CRRT 8/30 CRRT stopped 9/2: Reglan restarted.  Having multiple BMs.  9/3-9/4: NG output still high.  Holding on clamping trials for now.  9/6: tolerated NG clamp, NG tube d/c.  Started CLD.  Today, 02/23/2015:    Glucose - uncontrolled with CBG range 171-261.  Lantus increased 9/5 + SSI + insulin in TPN.  Patient on Lantus 40 units daily PTA. Stress dose steroids tapered off, but restarted 9/3.  Electrolytes:  Na WNL   Persistent hypokalemia requiring daily repletion  Mg 1.8, below goal of >2 (replaced 1g on 9/4, 9/5)  Phos decreased to WNL  CorrCa WNL. CaPhos product 31.  Renal - nonoliguric AKI s/p CRRT 8/23-8/30.   24h I/O = +742 mL  SCr improving slowly, but BUN remains elevated  Urine output documented as 0.9 mL/kg/hr yesterday, continues to make good urine today.  LFTs - AST/ALT, Alk Phos, Tbili WNL (9/5)  TGs - 155 (8/23), 175 (8/29), 183 (9/5)  Prealbumin - 5.3 (8/22), 10 (8/29), 17.5 (9/5)  NUTRITIONAL GOALS                                                                                             Updated RD recs 8/31 (off CRRT): 1950-2150 kcals, protein 95-105 g  Clinimix 5/15 at a goal rate of 74ml/hr + 20% fat emulsion at 25ml/hr to provide: 100 g/day protein, 1894 Kcal/day.   PLAN                                                                                                              Magnesium 1g IV once  Potassium q1h x 4 per MD, and Potassium q1h x 4 per PharmD  At  1800 today:  Reduce to Clinimix E 5/15 at 40 ml/hr.   Resume electrolytes in TPN  Wean to ~ 1/2 goal rate as oral diet intake increases.    Continue regular insulin in TPN: 15 units of regular insulin in 1L over 24 hour infusion.  Continue 20% lipid emulsion at 10 ml/hr.  Held lipids for first week of ICU admission, resumed 8/30.   TPN to contain standard multivitamins and trace elements. Consider removing chromium and selenium if SCr does not show improvement over next several days and remains on TPN.  CBG monitoring, resistant SSI q4h  TPN lab panels on Mondays & Thursdays.  BMET daily.  Mg and Phos daily per MD.  F/u diet orders and oral intake.  Wean TPN as appropriate for increasing PO nutrition.  Lynann Beaver PharmD, BCPS Pager [phone] 02/23/2015 12:01 PM

## 2015-02-23 NOTE — Progress Notes (Signed)
Physical Therapy Treatment Patient Details Name: Larry [name] MRN: [id number] DOB: Mar 02, 1946 Today's Date: 02/23/2015    History of Present Illness 69 year old male w/ CAF on Elliquis who recently underwent lap chole on 7/14. Was discharged home w/drain. Drain removed about a week before presentation to ER. Admitted directly from [facility] on 8/3 w/ septic shock which was likely due to infected hematoma in the GB fossa.  new perc drain placed on 8/4, when he remained hypotensive. Required HD  but now off with improving kidney function. On ventilator 8/26-8/29.    PT Comments    Pt feeling better and got to go outside with nursing.  Assisted with amb using EVA wqalker and + 2 assist.  Pt tolerated increased distance.    Follow Up Recommendations  SNF  progressing but still rec [facility] at [facility]   Equipment Recommendations  Rolling walker with 5" wheels    Recommendations for Other Services       Precautions / Restrictions Precautions Precautions: Fall Precaution Comments: JP drain on R, monitor HR, multiple lines Restrictions Weight Bearing Restrictions: No    Mobility  Bed Mobility               General bed mobility comments: Pt OOB in recliner  Transfers Overall transfer level: Needs assistance Equipment used: None Transfers: Sit to/from Stand Sit to Stand: Mod assist;+2 safety/equipment;+2 physical assistance         General transfer comment: 25% VC's on proper hand placement and increased time/assist.  increased assist with each time due to fatigue.   Ambulation/Gait Ambulation/Gait assistance: +2 physical assistance;+2 safety/equipment;Mod assist Ambulation Distance (Feet): 150 Feet (took 3 walks to complete) Assistive device: Bilateral platform walker Gait Pattern/deviations: Step-through pattern;Decreased stride length;Trunk flexed Gait velocity: decreased   General Gait Details: Very weak.  Used B platform EVA walker for increased support  and to increase activity tolerance/gait distance. tolerated increased distance.  very weak due to medical and extended length of stay.  did tolerate increased distance.   Stairs            Wheelchair Mobility    Modified Rankin (Stroke Patients Only)       Balance                                    Cognition Arousal/Alertness: Awake/alert Behavior During Therapy: WFL for tasks assessed/performed Overall Cognitive Status: Within Functional Limits for tasks assessed                 General Comments: much better spirits    Exercises      General Comments        Pertinent Vitals/Pain Pain Assessment: No/denies pain    Home Living                      Prior Function            PT Goals (current goals can now be found in the care plan section) Progress towards PT goals: Progressing toward goals    Frequency  Min 3X/week    PT Plan Current plan remains appropriate    Co-evaluation             End of Session Equipment Utilized During Treatment: Gait belt Activity Tolerance: Patient tolerated treatment well Patient left: in chair;with call bell/phone within reach     Time: 4098-1191  PT Time Calculation (min) (ACUTE ONLY): 40 min  Charges:  $Gait Training: 23-37 mins $Therapeutic Activity: 8-22 mins                    G Codes:      Felecia Shelling  PTA [facility]  Acute  Rehab Pager      [phone]

## 2015-02-23 NOTE — Progress Notes (Signed)
Nutrition Note  Pt has been ordered a 48 hour Calorie Count.   Please hang calorie count envelope on the patient's door. Document percent consumed for each item on the patient's meal tray ticket and keep in envelope. Also document percent of any supplement or snack pt consumes and keep documentation in envelope for RD to review.   RD to follow-up with day 1 results 9/8.  Tilda Franco, MS, RD, LDN Pager: [phone] After Hours Pager: [phone]

## 2015-02-23 NOTE — Progress Notes (Signed)
CSW / PT met with pt at bedside to review PT recommendations. PT is recommending ST Rehab at d/c. Pt continues to require 2 person assist. Pt would prefer to return home at d/c but is willing to consider ST Rehab. Spouse will be visiting shortly. CSW will provide SNF bed offers at that time.  Werner Lean LCSW [phone]

## 2015-02-23 NOTE — Progress Notes (Signed)
Per pt request, please do not wake for 0200 HHN tx. Pt would prefer to sleep instead. RT will continue to monitor.

## 2015-02-23 NOTE — Progress Notes (Signed)
Subjective: Hated the clear chicken broth. Denies n/v. +bms. Told me he walked about 158ft. Denies abd pain  Objective: Vital signs in last 24 hours: Temp:  [97.5 F (36.4 C)-98.8 F (37.1 C)] 98.8 F (37.1 C) (09/07 1200) Pulse Rate:  [73-102] 89 (09/07 1000) Resp:  [21-33] 30 (09/07 1000) BP: (129-160)/(54-88) 140/77 mmHg (09/07 1000) SpO2:  [92 %-97 %] 96 % (09/07 1000) Weight:  [112.5 kg (248 lb 0.3 oz)] 112.5 kg (248 lb 0.3 oz) (09/07 0430) Last BM Date: 02/22/15  Intake/Output from previous day: 09/06 0701 - 09/07 0700 In: 3305.5 [P.O.:240; I.V.:300; IV Piggyback:550; TPN:2200.5] Out: 2460 [Urine:2450; Drains:10] Intake/Output this shift: Total I/O In: 755 [I.V.:40; IV Piggyback:250; TPN:465] Out: 600 [Urine:600]  Alert, sitting in chair, talkative Soft, nt, nd, drain - not really anything  Lab Results:   Recent Labs  02/22/15 0515 02/23/15 0550  WBC 10.1 11.8*  HGB 8.6* 8.9*  HCT 27.6* 28.3*  PLT 327 371   BMET  Recent Labs  02/22/15 2018 02/23/15 0550  NA 139 141  K 3.8 3.3*  CL 109 110  CO2 21* 21*  GLUCOSE 246* 152*  BUN 103* 99*  CREATININE 2.92* 2.73*  CALCIUM 8.2* 8.3*   PT/INR No results for input(s): LABPROT, INR in the last 72 hours. ABG No results for input(s): PHART, HCO3 in the last 72 hours.  Invalid input(s): PCO2, PO2  Studies/Results: Dg Chest Port 1 View  02/22/2015   CLINICAL DATA:  Cough.  Shortness of breath.  EXAM: PORTABLE CHEST - 1 VIEW  COMPARISON:  02/21/2015.  FINDINGS: NG tube and right PICC line in stable position. Cardiomegaly with normal pulmonary vascularity. Low lung volumes with bibasilar atelectasis. No pleural effusion or pneumothorax. Degenerative changes both shoulders.  IMPRESSION: 1. Lines and tubes in stable position. 2. Low lung volumes with bibasilar atelectasis. 3. Stable cardiomegaly.   Electronically Signed   By: Maisie Fus  Register   On: 02/22/2015 07:15    Anti-infectives: Anti-infectives    Start     Dose/Rate Route Frequency Ordered Stop   02/23/15 1200  cefTRIAXone (ROCEPHIN) 2 g in dextrose 5 % 50 mL IVPB     2 g 100 mL/hr over 30 Minutes Intravenous Every 24 hours 02/23/15 0956     02/23/15 1200  metroNIDAZOLE (FLAGYL) tablet 500 mg     500 mg Oral 4 times per day 02/23/15 0956     02/17/15 1400  vancomycin (VANCOCIN) IVPB 1000 mg/200 mL premix     1,000 mg 200 mL/hr over 60 Minutes Intravenous  Once 02/17/15 1320 02/17/15 1502   02/16/15 2200  piperacillin-tazobactam (ZOSYN) IVPB 3.375 g  Status:  Discontinued     3.375 g 12.5 mL/hr over 240 Minutes Intravenous 3 times per day 02/16/15 1603 02/23/15 0956   02/15/15 2200  meropenem (MERREM) 500 mg in sodium chloride 0.9 % 50 mL IVPB  Status:  Discontinued     500 mg 100 mL/hr over 30 Minutes Intravenous Every 12 hours 02/15/15 1415 02/16/15 1533   02/14/15 1200  vancomycin (VANCOCIN) IVPB 1000 mg/200 mL premix  Status:  Discontinued     1,000 mg 200 mL/hr over 60 Minutes Intravenous Every 24 hours 02/13/15 0920 02/16/15 0706   02/13/15 1000  vancomycin (VANCOCIN) 1,750 mg in sodium chloride 0.9 % 500 mL IVPB     1,750 mg 250 mL/hr over 120 Minutes Intravenous  Once 02/13/15 0836 02/13/15 1210   02/12/15 1630  meropenem (MERREM) 500 mg in sodium chloride 0.9 %  50 mL IVPB  Status:  Discontinued     500 mg 100 mL/hr over 30 Minutes Intravenous Every 6 hours 02/12/15 1106 02/15/15 1415   02/11/15 1800  anidulafungin (ERAXIS) 100 mg in sodium chloride 0.9 % 100 mL IVPB     100 mg over 90 Minutes Intravenous Every 24 hours 02/10/15 1744     02/10/15 1730  anidulafungin (ERAXIS) 200 mg in sodium chloride 0.9 % 200 mL IVPB     200 mg over 180 Minutes Intravenous  Once 02/10/15 1720 02/10/15 2230   02/10/15 0200  meropenem (MERREM) 500 mg in sodium chloride 0.9 % 50 mL IVPB  Status:  Discontinued     500 mg 100 mL/hr over 30 Minutes Intravenous Every 8 hours 02/09/15 1808 02/12/15 1106   02/10/15 0000  meropenem (MERREM) 500  mg in sodium chloride 0.9 % 50 mL IVPB  Status:  Discontinued     500 mg 100 mL/hr over 30 Minutes Intravenous 4 times per day 02/09/15 1655 02/09/15 1808   02/09/15 1800  meropenem (MERREM) 1 g in sodium chloride 0.9 % 100 mL IVPB     1 g 200 mL/hr over 30 Minutes Intravenous  Once 02/09/15 1655 02/09/15 1818   02/08/15 1800  piperacillin-tazobactam (ZOSYN) IVPB 3.375 g  Status:  Discontinued     3.375 g 100 mL/hr over 30 Minutes Intravenous 4 times per day 02/08/15 1403 02/09/15 1655   02/07/15 1400  piperacillin-tazobactam (ZOSYN) IVPB 2.25 g  Status:  Discontinued     2.25 g 100 mL/hr over 30 Minutes Intravenous 3 times per day 02/07/15 0750 02/08/15 1403   02/04/15 1200  piperacillin-tazobactam (ZOSYN) IVPB 2.25 g  Status:  Discontinued     2.25 g 100 mL/hr over 30 Minutes Intravenous 4 times per day 02/04/15 0859 02/04/15 0905   02/04/15 1200  piperacillin-tazobactam (ZOSYN) IVPB 3.375 g  Status:  Discontinued     3.375 g 12.5 mL/hr over 240 Minutes Intravenous Every 8 hours 02/04/15 0905 02/07/15 0750   02/02/15 2000  fluconazole (DIFLUCAN) tablet 100 mg     100 mg Oral Every 24 hours 02/02/15 1908 02/08/15 2041   02/02/15 1800  fluconazole (DIFLUCAN) tablet 100 mg  Status:  Discontinued     100 mg Oral Daily 02/02/15 1545 02/02/15 1901   01/31/15 1000  fluconazole (DIFLUCAN) tablet 100 mg  Status:  Discontinued     100 mg Oral Daily 01/31/15 0727 02/02/15 1313   01/29/15 1700  piperacillin-tazobactam (ZOSYN) IVPB 2.25 g  Status:  Discontinued     2.25 g 100 mL/hr over 30 Minutes Intravenous Every 8 hours 01/29/15 0845 02/04/15 0859   01/24/15 2200  vancomycin (VANCOCIN) IVPB 1000 mg/200 mL premix     1,000 mg 200 mL/hr over 60 Minutes Intravenous  Once 01/24/15 1349 01/24/15 2301   01/21/15 2000  vancomycin (VANCOCIN) 1,500 mg in sodium chloride 0.9 % 500 mL IVPB  Status:  Discontinued     1,500 mg 250 mL/hr over 120 Minutes Intravenous Every 24 hours 01/21/15 1048 01/23/15  1726   01/20/15 2000  vancomycin (VANCOCIN) 1,250 mg in sodium chloride 0.9 % 250 mL IVPB  Status:  Discontinued     1,250 mg 166.7 mL/hr over 90 Minutes Intravenous Every 24 hours 01/19/15 1945 01/20/15 0843   01/20/15 2000  vancomycin (VANCOCIN) 1,250 mg in sodium chloride 0.9 % 250 mL IVPB  Status:  Discontinued     1,250 mg 166.7 mL/hr over 90 Minutes Intravenous Every 24  hours 01/20/15 1519 01/21/15 1048   01/20/15 0000  piperacillin-tazobactam (ZOSYN) IVPB 3.375 g  Status:  Discontinued     3.375 g 12.5 mL/hr over 240 Minutes Intravenous Every 8 hours 01/19/15 1946 01/29/15 0845   01/19/15 1715  vancomycin (VANCOCIN) 2,500 mg in sodium chloride 0.9 % 500 mL IVPB     2,500 mg 250 mL/hr over 120 Minutes Intravenous  Once 01/19/15 1707 01/19/15 2159   01/19/15 1715  piperacillin-tazobactam (ZOSYN) IVPB 3.375 g     3.375 g 100 mL/hr over 30 Minutes Intravenous  Once 01/19/15 1707 01/19/15 1900      Assessment/Plan: If tolerates fulls for lunch, adv diet as tolerated Can wean steroids from my point of view Agree with calorie counts abx changed today based on peritoneal fluid culture results Cont pt/ot Monitor renal function As PO improves (hopefully), will wean TPN but not ready for that quite yet SCDs only for vte prophylaxis.   [name]. [name], MD, FACS General, Bariatric, & Minimally Invasive Surgery [facility], [place]   LOS: 35 days    [name] 02/23/2015
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
## 2015-02-23 NOTE — Progress Notes (Signed)
PULMONARY / CRITICAL CARE MEDICINE   Name: Larry [name] MRN: [id number] DOB: [birth year]    ADMISSION DATE:  01/19/2015 CONSULTATION DATE:  8/4  REFERRING MD :  [name]  CHIEF COMPLAINT:  Septic shock   INITIAL PRESENTATION:  69 year old male w/ CAF on Elliquis who recently underwent lap chole on 7/14. Was discharged home w/drain. Drain removed about a week before presentation to ER. Admitted directly from [facility] on 8/3 w/ septic shock which was likely due to infected hematoma in the GB fossa. PCCM asked to see after new perc drain placed on 8/4, when he remained hypotensive.  Required CRRT 8/23 for AKI  Intubated 8/26 for desaturations, significant agitation, delirium  STUDIES:  CT abdomen/Pelvis W/ 7/28 - Gas & fluid at cholecystectomy site w/o discrete abscess. Min ascites. Small right pleural eff. CT abdomen/pelvis w/o 8/5 - Gas between liver & stomach. No additional free air. Increased flow void & high density layering c/w increasing hemorrhage as well as increased right pleural eff. CT abdomen/pelvis 8/18 >> 9.8 cm hematoma with gas in the gallbladder fossa, indwelling pigtail drainage catheter, 12.6 cm hematoma along the posterior R liver, mod ascites, mild wall thickening involving the R colon CT abdomen/pelvis 8/22 >> stable perihepatic hematoma, stable gallbladder fossa collection despite adequate drainage cath placement, significant free fluid within the abdomen/pelvis slightly increased from prior exam 8/31 CT abd >> persistent large fluid collection at the gallbladder fossa 11.7 x 9.0 x 8.5 cm previously 9.7 x 9.7 x 8.8 cm. Additional subhepatic collection anteromedial to gallbladder collection containing air and fluid, decreased in size now measuring 5.0 x 5.0 x 4.4 cm, Significant ascites  SIGNIFICANT EVENTS: 8/03  Admitted w/ abd pain and hypotension  8/04  hypotensive over-night. PERC drain of GB fossa. Found what looked like old infected hematoma. PCCM asked to  see post-op for shock 8/05  off pressors. WOB worse.  8/06  WOB & wheezing slightly worse. Progressing abdominal pain 8/07  Hgb declining w/ increasing intraperitoneal hemorrhage 8/07  Transfused 2u PRBCs 8/09  work of breathing still significant w/ marked upper airway component  8/10  breathing better. 1.6 liters negative. Slight bump in scr. Vanc stopped. Getting OOB for first time 8/11  eating regular diet. Up in chair. Feeling better.  Respiratory status improved.  PCCM s/o 8/14  Renal consulted for rising sr cr (to 3.60), baseline sr cr 1.1-1.5 (prior IV contrast 7/28).   8/14  Concern for yeast / bacterial UTI  8/15  ID consulted > abx narrowed to zosyn only, fluconazole for yeast  8/16  UOP increased on IV lasix 8/18  NPO, sr cr improving, CT abd as above  8/18  GB drain upsized per IR to 16 Fr 8/19  Abd pain, eating well.  Diarrhea after ensure.  To PO lasix.  Discussed gastrostomy tube with IR 8/22  Sr Cr rising, intermittent vomiting.  CT abd as above.  Increased confusion 8/23  HD cath inserted, CRRT initiated.  Gastrostomy tube held off due to CRRT & Hgb drop 8/24  WBC elevated, cultures repeated.   8/24  Paracentesis >> 150 ml "bloody bile" fluid removed, loculated and difficult to aspirate 8/25  Palliative care consulted, PCCM consulted.  Vomiting with NGT insertion.  8/26  Intubated early am(0200) for desaturations, significant agitation, delirium  8/30  CRRT stopped, bilious vomiting/secretions 8/31  Paracentesis >> 3.1 L 9/01  Transfused 1 U PRBC 9/04  vaso re added, improved neo off 9/06  SLP evaluation >> cleared for regular diet, thin liquids    SUBJECTIVE:  Pt reports feeling much better, walked yesterday.  Ate broth yesterday as his first meal and he had diarrhea after.  Dry cough (not associated with eating).    VITAL SIGNS: Temp:  [97.5 F (36.4 C)-98 F (36.7 C)] 97.6 F (36.4 C) (09/07 0400) Pulse Rate:  [73-102] 91 (09/07 0800) Resp:  [21-33] 29 (09/07  0800) BP: (119-160)/(54-88) 154/84 mmHg (09/07 0800) SpO2:  [92 %-96 %] 96 % (09/07 0800) Weight:  [248 lb 0.3 oz (112.5 kg)] 248 lb 0.3 oz (112.5 kg) (09/07 0430)  HEMODYNAMICS:     VENTILATOR SETTINGS:     INTAKE / OUTPUT:  Intake/Output Summary (Last 24 hours) at 02/23/15 0848 Last data filed at 02/23/15 0800  Gross per 24 hour  Intake   3085 ml  Output   2510 ml  Net    575 ml    PHYSICAL EXAMINATION: General:  Chronically ill adult male, no acute distress Neuro:  Interactive, nonfocal, oriented, MAE HEENT: mm pink/moist, no jvd Cardiovascular:  s1s2 rrr, no m/r/g Lungs:  Even/non-labored, lungs bilaterally diminished lower, dry cough Abdomen: Hypoactive BS. Non-tender, drain in place Musculoskeletal:  No rash, joint inflammation Skin:  Warm/dry, trace LE edema   LABS:  CBC  Recent Labs Lab 02/21/15 0555 02/22/15 0515 02/23/15 0550  WBC 10.5 10.1 11.8*  HGB 6.8* 8.6* 8.9*  HCT 21.5* 27.6* 28.3*  PLT 298 327 371   Coag's  Recent Labs Lab 02/21/15 0555 02/22/15 0515 02/23/15 0550  APTT 31 27 29    BMET  Recent Labs Lab 02/22/15 0515 02/22/15 2018 02/23/15 0550  NA 139 139 141  K 3.2* 3.8 3.3*  CL 108 109 110  CO2 21* 21* 21*  BUN 108* 103* 99*  CREATININE 3.02* 2.92* 2.73*  GLUCOSE 275* 246* 152*   Electrolytes  Recent Labs Lab 02/21/15 0555  02/22/15 0515 02/22/15 2018 02/23/15 0550  CALCIUM 7.9*  < > 8.0* 8.2* 8.3*  MG 1.9  --  1.7  --  1.8  PHOS 4.8*  < > 4.3 3.1 3.1  < > = values in this interval not displayed.   Sepsis Markers No results for input(s): LATICACIDVEN, PROCALCITON, O2SATVEN in the last 168 hours.   ABG No results for input(s): PHART, PCO2ART, PO2ART in the last 168 hours.   Liver Enzymes  Recent Labs Lab 02/17/15 0500  02/21/15 0555  02/22/15 0515 02/22/15 2018 02/23/15 0550  AST 47*  --  29  --   --   --   --   ALT 26  --  18  --   --   --   --   ALKPHOS 136*  --  113  --   --   --   --   BILITOT  1.7*  --  1.2  --   --   --   --   ALBUMIN 1.8*  < > 2.0*  < > 1.9* 2.1* 2.0*  < > = values in this interval not displayed.   Cardiac Enzymes No results for input(s): TROPONINI, PROBNP in the last 168 hours.   Glucose  Recent Labs Lab 02/22/15 0746 02/22/15 1158 02/22/15 1641 02/22/15 1940 02/22/15 2338 02/23/15 0409  GLUCAP 217* 203* 240* 261* 171* 171*    Imaging No results found.  ASSESSMENT / PLAN:  PULMONARY ETT 8/26 >> 8/29 A:  Acute Hypoxic Respiratory Failure - in the setting of significant agitation, renal fx,  delirium  - resolved Chronically elevated right HD Right pleural effusion - progessing on Abd CT 8/6 P:   Xopenex neb q6hr PRN IS / Pulmonary hygiene  Intermittent CXR  CARDIOVASCULAR CVL Right IJ 8/4 >> 8/22 RUE PICC 8/22 >>  A:  Severe sepsis/septic shock - remains Hypotension - 8/26, started on neo post intubation, smouldering sepsis Hx PAF - CHADVASC score 4; currently NSR  Prior NSTEMI  Adrenal insuff P:  Off vasopressors Wean stress steroids to off  Change to SDU status   RENAL A:   AKI- in setting of septic shock, prior hypotension/ATN Non oliguric now Hypokalemia  P:   Trend BMP / UOP  D/C foley cath HD cath out, hope for full renal recovery, but slow  GASTROINTESTINAL A:   Infected hematoma s/p cholecystectomy - s/p CT guided Perc drain with subsequent upsize to 16 Fr, Paracentesis 8/31 c/w peritonitis GERD - worse 8/5 w/ associated UAW wheeze Intra-abdominal hemorrhage / hematoma  Nausea / Vomiting - concern for ileus (8/25) Protein Calorie Malnutrition  P:   Protonix QD Passed swallow evaluation 9/6, cleared for regular diet consistency/thin liquids Full liquid diet per Dr. [name]  Consider D/C TNA once adequate consumption of PO intake Calorie count  HEMATOLOGIC A:   Anemia - in setting of intra-abdominal hemorrhage (8/4).  S/p 2 u PRBC on 8/4, 2u 8/7, 1 U 9/1, 1 unit 9/5 Dilutional anemia contribution P:  Limit  phlebotomy when able SCDs Trend CBC  INFECTIOUS A:   Severe sepsis/septic shock - presume source is infected hematoma from recent Cholecystectomy. Shock resolved.  S/p Perc GB fossa site drain 8/4 - minimal serous output High Risk for Fungemia P:   ID following, appreciate input   Abscess 8/4>>>neg BCX2 8/4>>>neg  Peritoneal fluid GS 8/24 >> abundant WBC, no organisms seen Peritoneal fluid culture 8/24 >> ng BCx2 8/25 >> ndg Urine 8/14 >> yeast  Ascitic fluid 8/31 > GNR >> c diff 9/1 >> neg   Meropenem 8/24 >>  Fluconazole 8/15 >> 8/23 anidulafungin 8/26 >> vanc 8/28 >> 8/31 Zosyn 8/31 >>   ABX / Antifungals per ID Follow cultures   ENDOCRINE A: DM - CBG's better  Controlled, actually low improved Adrenal Insufficiency P:   SSI Q4 Lantus 20 units Insulin in TNA  NEUROLOGIC A:  Delirium  Critical Illness Deconditioning P:   PT  Mobilize as able    FAMILY  - Updates:  Patient updated on status / plan of care.    - Inter-disciplinary family meet or Palliative Care meeting due by:  Ongoing, family wants to continue full aggressive measures.  Hopeful that patient will be able to recover.     PCCM will sign off.  Please call back if we can be of further assistance.   Canary Brim, NP-C [facility] Pgr: [phone] or if no answer [phone] 02/23/2015, 8:48 AM

## 2015-02-23 NOTE — Progress Notes (Signed)
PROGRESS NOTE    Larry [name]:[id number] DOB: 01/21/1946 DOA: 01/19/2015 PCP: [name]  HPI/Brief narrative 69 year old male with PMH of A. fib on eliquis, HTN, HLD, 2, initially hospitalized 10/05/14 with sepsis due to acute cholecystitis, PC drain by IR on 10/06/14, DC'ed home on 10/11/2014. He was readmitted 12/30/2014 and underwent Lap cholecystectomy with IOC on 12/30/14. He presented to [facility] [place] on 01/19/15 with abdominal pain and was transferred and admitted to [facility] by general surgery for septic shock related to infected hematoma. IR, CCS and ID continued to follow. Drain upsized 02/04/15. Status post 3 units PRBC for suspected slow intraperitoneal hemorrhage from cholecystectomy site. Developed acute renal failure, nephrology consulted and started CRRT on 02/08/15. Primary care transferred to [facility] starting 02/08/15 d/t acute respiratory failure, delirium. Has been off pressors for a couple of days. Extubated and stable. TRH resumed primary care 02/23/15 and CCM signed off.   Assessment/Plan:  Acute hypoxic respiratory failure -Secondary to significant agitation, renal failure/possible uremia and delirium (resolved) - Patient has chronically elevated right hemidiaphragm. - Patient was on ventilatory support 8/26 > 8/29. Extubated and stable.  Septic shock - Off vasodepressors and stable. Resolved. - Primary source of sepsis: Infected hematoma from recent cholecystectomy and infected ascitic fluid.  Paroxysmal A. Fib - ChadVasc score: 4 - Currently in sinus rhythm.  Adrenal insufficiency - Weaning steroids to off  Acute kidney injury - In the setting of septic shock, prior hypotension/ATN - Status post CRRT. HD catheter out. DC Foley catheter 9/7. - Nephrology following. Expect slow improvement.  Hypokalemia/hypomagnesemia - Replace and follow as needed.  Infected hematoma status post cholecystectomy/peritonitis - Status post CT-guided  percutaneous drain with subsequent upsized to 16 [place] - Paracentesis 8/31 consistent with peritonitis - Improving. Diet advanced to full liquids by general surgery on 9/7. Past swallow evaluation 9/6-cleared for regular diet consistency and thin liquids. - TNA being tapered down as diet improves -  ID following. Ascitic fluid cultures show Klebsiella-consider discontinuing antifungals.  GERD - PPI  Protein calorie malnutrition - On TNA-being tapered as oral intake improves.  Acute blood loss anemia in the setting of intra-abdominal hemorrhage - Status post 6 units PRBC since admission. Stable. Follow CBCs.  Diabetes mellitus  - continue Lantus and SSI. Controlled  Delirium  - Secondary to critical illness and ICU care. Improving or even resolved.   Essential hypertension - Controlled  Ileus - Resolved  Functional quadriplegia - Secondary to acute illness and prolonged hospitalization. Work with PT. Likely SNF at discharge.   DVT prophylaxis: SCDs  Code Status: Full Family Communication: None at bedside  Disposition Plan: Continue monitoring and management in the stepdown unit for additional 24-48 hours due to his prolonged critical care course and only now stabilizing.    SIGNIFICANT EVENTS: 8/03 Admitted w/ abd pain and hypotension  8/04 hypotensive over-night. PERC drain of GB fossa. Found what looked like old infected hematoma. PCCM asked to see post-op for shock 8/05 off pressors. WOB worse.  8/06 WOB & wheezing slightly worse. Progressing abdominal pain 8/07 Hgb declining w/ increasing intraperitoneal hemorrhage 8/07 Transfused 2u PRBCs 8/09 work of breathing still significant w/ marked upper airway component  8/10 breathing better. 1.6 liters negative. Slight bump in scr. Vanc stopped. Getting OOB for first time 8/11 eating regular diet. Up in chair. Feeling better. Respiratory status improved. PCCM s/o 8/14 Renal consulted for rising sr cr (to  3.60), baseline sr cr 1.1-1.5 (prior IV contrast 7/28).  8/14 Concern for yeast / bacterial UTI  8/15 ID consulted > abx narrowed to zosyn only, fluconazole for yeast  8/16 UOP increased on IV lasix 8/18 NPO, sr cr improving, CT abd as above  8/18 GB drain upsized per IR to 16 Fr 8/19 Abd pain, eating well. Diarrhea after ensure. To PO lasix. Discussed gastrostomy tube with IR 8/22 Sr Cr rising, intermittent vomiting. CT abd as above. Increased confusion 8/23 HD cath inserted, CRRT initiated. Gastrostomy tube held off due to CRRT & Hgb drop 8/24 WBC elevated, cultures repeated.  8/24 Paracentesis >> 150 ml "bloody bile" fluid removed, loculated and difficult to aspirate 8/25 Palliative care consulted, PCCM consulted. Vomiting with NGT insertion.  8/26 Intubated early am(0200) for desaturations, significant agitation, delirium  8/30 CRRT stopped, bilious vomiting/secretions 8/31 Paracentesis >> 3.1 L 9/01 Transfused 1 U PRBC 9/04 vaso re added, improved neo off 9/06 SLP evaluation >> cleared for regular diet, thin liquids  Consultants:  CCM  Nephrology  General surgery  Infectious disease  Palliative care medicine  Interventional radiology  Procedures:  RUQ drain  RUE PICC line  Foley catheter-DC'd 9/7  HD catheter placement in left IJ on 8/23-DC'd  Paracentesis by IR on 8/24 & 8/31   Antibiotics: Meropenem 8/24 >>  Fluconazole 8/15 >> 8/23 anidulafungin 8/26 >> vanc 8/28 >> 8/31 Zosyn 8/31 >>    Subjective: Feels better. Had multiple diarrhea after drinking soup yesterday-improving. States that he ambulated with PT. Denies dyspnea or pain. As per nursing, no acute events.   Objective: Filed Vitals:   02/23/15 0600 02/23/15 0800 02/23/15 0921 02/23/15 1000  BP: 160/68 154/84  140/77  Pulse: 93 91  89  Temp:  98 F (36.7 C)    TempSrc:  Oral    Resp: 31 29  30   Height:      Weight:      SpO2: 94% 96% 97% 96%     Intake/Output Summary (Last 24 hours) at 02/23/15 1315 Last data filed at 02/23/15 1238  Gross per 24 hour  Intake 3130.5 ml  Output   2510 ml  Net  620.5 ml   Filed Weights   02/21/15 0530 02/22/15 0322 02/23/15 0430  Weight: 111.1 kg (244 lb 14.9 oz) 112.4 kg (247 lb 12.8 oz) 112.5 kg (248 lb 0.3 oz)     Exam:  General exam: Moderately built and obese middle-aged male looks much better than he did when I saw him approximately 2 weeks ago. Respiratory system: reduced breath sounds in the bases but otherwise clear to auscultation. No increased work of breathing. Cardiovascular system: S1 & S2 heard, RRR. No JVD, murmurs, gallops, clicks. Trace bilateral ankle edema. Gastrointestinal system: Abdomen is nondistended, soft and nontender. Normal bowel sounds heard. RUQ drain +. Foley +  Central nervous system: Alert and oriented. No focal neurological deficits. Extremities: Symmetric 5 x 5 power. RUE PICC line +    Data Reviewed: Basic Metabolic Panel:  Recent Labs Lab 02/20/15 0450  02/20/15 1700  02/21/15 0555 02/21/15 1730 02/22/15 0515 02/22/15 2018 02/23/15 0550  NA 136  136  < >  --   < > 138 138 139 139 141  K 3.3*  3.3*  < >  --   < > 3.2* 3.0* 3.2* 3.8 3.3*  CL 101  100*  < >  --   < > 106 107 108 109 110  CO2 25  25  < >  --   < > 23 22 21*  21* 21*  GLUCOSE 217*  216*  < >  --   < > 207* 176* 275* 246* 152*  BUN 103*  103*  < >  --   < > 102* 100* 108* 103* 99*  CREATININE 3.63*  3.64*  < >  --   < > 3.28* 3.22* 3.02* 2.92* 2.73*  CALCIUM 7.7*  7.7*  < >  --   < > 7.9* 7.9* 8.0* 8.2* 8.3*  MG 1.8  --  1.8  --  1.9  --  1.7  --  1.8  PHOS 5.5*  5.5*  < >  --   --  4.8* 4.4 4.3 3.1 3.1  < > = values in this interval not displayed. Liver Function Tests:  Recent Labs Lab 02/17/15 0500  02/21/15 0555 02/21/15 1730 02/22/15 0515 02/22/15 2018 02/23/15 0550  AST 47*  --  29  --   --   --   --   ALT 26  --  18  --   --   --   --   ALKPHOS 136*   --  113  --   --   --   --   BILITOT 1.7*  --  1.2  --   --   --   --   PROT 5.7*  --  6.1*  --   --   --   --   ALBUMIN 1.8*  < > 2.0* 2.0* 1.9* 2.1* 2.0*  < > = values in this interval not displayed. No results for input(s): LIPASE, AMYLASE in the last 168 hours. No results for input(s): AMMONIA in the last 168 hours. CBC:  Recent Labs Lab 02/19/15 0504 02/20/15 0450 02/21/15 0555 02/22/15 0515 02/23/15 0550  WBC 9.5 12.3* 10.5 10.1 11.8*  NEUTROABS  --   --  9.5*  --   --   HGB 7.7* 8.0* 6.8* 8.6* 8.9*  HCT 24.3* 24.9* 21.5* 27.6* 28.3*  MCV 93.8 92.9 93.5 93.6 93.4  PLT 244 281 298 327 371   Cardiac Enzymes: No results for input(s): CKTOTAL, CKMB, CKMBINDEX, TROPONINI in the last 168 hours. BNP (last 3 results) No results for input(s): PROBNP in the last 8760 hours. CBG:  Recent Labs Lab 02/22/15 1940 02/22/15 2338 02/23/15 0409 02/23/15 0759 02/23/15 1243  GLUCAP 261* 171* 171* 181* 196*    Recent Results (from the past 240 hour(s))  Culture, body fluid-bottle     Status: None   Collection Time: 02/16/15  3:56 PM  Result Value Ref Range Status   Specimen Description FLUID PERITONEAL  Final   Special Requests NONE  Final   Gram Stain   Final    GRAM NEGATIVE RODS ANAEROBIC BOTTLE ONLY CRITICAL RESULT CALLED TO, READ BACK BY AND VERIFIED WITH: Mellody Life RN [phone] GREEN R CONFIRMED BY K. BARR    Culture   Final    KLEBSIELLA PNEUMONIAE Performed at [facility]    Report Status 02/23/2015 FINAL  Final   Organism ID, Bacteria KLEBSIELLA PNEUMONIAE  Final      Susceptibility   Klebsiella pneumoniae - MIC*    AMPICILLIN >=32 RESISTANT Resistant     CEFAZOLIN >=64 RESISTANT Resistant     CEFEPIME <=1 SENSITIVE Sensitive     CEFTAZIDIME 4 SENSITIVE Sensitive     CEFTRIAXONE <=1 SENSITIVE Sensitive     CIPROFLOXACIN <=0.25 SENSITIVE Sensitive     GENTAMICIN <=1 SENSITIVE Sensitive     IMIPENEM <=0.25 SENSITIVE Sensitive  TRIMETH/SULFA  <=20 SENSITIVE Sensitive     AMPICILLIN/SULBACTAM >=32 RESISTANT Resistant     PIP/TAZO >=128 RESISTANT Resistant     * KLEBSIELLA PNEUMONIAE  Gram stain     Status: None   Collection Time: 02/16/15  3:56 PM  Result Value Ref Range Status   Specimen Description FLUID PERITONEAL  Final   Special Requests NONE  Final   Gram Stain   Final    ABUNDANT WBC PRESENT,BOTH PMN AND MONONUCLEAR NO ORGANISMS SEEN Performed at [facility]    Report Status 02/16/2015 FINAL  Final  C difficile quick scan w PCR reflex     Status: None   Collection Time: 02/17/15 12:00 PM  Result Value Ref Range Status   C Diff antigen NEGATIVE NEGATIVE Final   C Diff toxin NEGATIVE NEGATIVE Final   C Diff interpretation Negative for toxigenic C. difficile  Final           Studies: Dg Chest Port 1 View  02/22/2015   CLINICAL DATA:  Cough.  Shortness of breath.  EXAM: PORTABLE CHEST - 1 VIEW  COMPARISON:  02/21/2015.  FINDINGS: NG tube and right PICC line in stable position. Cardiomegaly with normal pulmonary vascularity. Low lung volumes with bibasilar atelectasis. No pleural effusion or pneumothorax. Degenerative changes both shoulders.  IMPRESSION: 1. Lines and tubes in stable position. 2. Low lung volumes with bibasilar atelectasis. 3. Stable cardiomegaly.   Electronically Signed   By: Maisie Fus  Register   On: 02/22/2015 07:15        Scheduled Meds: . anidulafungin  100 mg Intravenous Q24H  . antiseptic oral rinse  7 mL Mouth Rinse q12n4p  . cefTRIAXone (ROCEPHIN)  IV  2 g Intravenous Q24H  . chlorhexidine  15 mL Mouth Rinse BID  . darbepoetin (ARANESP) injection - NON-DIALYSIS  200 mcg Subcutaneous Q Wed-1800  . hydrocortisone sodium succinate  50 mg Intravenous Q6H  . insulin aspart  0-20 Units Subcutaneous 6 times per day  . insulin glargine  20 Units Subcutaneous Daily  . ipratropium  0.5 mg Nebulization Q6H  . levalbuterol  0.63 mg Nebulization Q6H  . magnesium sulfate 1 - 4 g bolus IVPB   1 g Intravenous Once  . metroNIDAZOLE  500 mg Oral 4 times per day  . nystatin   Topical TID  . pantoprazole (PROTONIX) IV  40 mg Intravenous Q24H  . potassium chloride  10 mEq Intravenous Q1 Hr x 4  . potassium chloride  10 mEq Intravenous Q1 Hr x 4  . sodium chloride  10-40 mL Intracatheter Q12H   Continuous Infusions: . sodium chloride 250 mL (02/20/15 0857)  . TPN (CLINIMIX) Adult without lytes 83 mL/hr at 02/22/15 1730   And  . fat emulsion 240 mL (02/22/15 1730)  . Marland KitchenTPN (CLINIMIX-E) Adult     And  . fat emulsion      Principal Problem:   Septic shock Active Problems:   Anticoagulated   Paroxysmal a-fib   Acute blood loss anemia   Catheter-associated urinary tract infection   Intra-abdominal infection   Candidiasis of urogenital site   Acute respiratory failure with hypoxia   Infected hematoma GB fossa of liver   Leukocytosis   Diabetes mellitus with renal complications   Anemia of chronic disease   Hypokalemia   Hypomagnesemia   Acute renal failure   Protein-calorie malnutrition, severe   AKI (acute kidney injury)   Encounter for palliative care   Acute encephalopathy    Time spent:  35 minutes.    Marcellus Scott, MD, FACP, FHM. [facility] Pager [phone]  If 7PM-7AM, please contact night-coverage [email or web address] Password [email or web address] 02/23/2015, 1:15 PM    LOS: 35 days

## 2015-02-24 LAB — COMPREHENSIVE METABOLIC PANEL
ALT: 25 U/L (ref 17–63)
AST: 27 U/L (ref 15–41)
Albumin: 2 g/dL — ABNORMAL LOW (ref 3.5–5.0)
Alkaline Phosphatase: 114 U/L (ref 38–126)
Anion gap: 7 (ref 5–15)
BUN: 94 mg/dL — AB (ref 6–20)
CHLORIDE: 114 mmol/L — AB (ref 101–111)
CO2: 20 mmol/L — ABNORMAL LOW (ref 22–32)
Calcium: 8.1 mg/dL — ABNORMAL LOW (ref 8.9–10.3)
Creatinine, Ser: 2.42 mg/dL — ABNORMAL HIGH (ref 0.61–1.24)
GFR calc Af Amer: 30 mL/min — ABNORMAL LOW (ref >60)
GFR, EST NON AFRICAN AMERICAN: 26 mL/min — AB (ref >60)
Glucose, Bld: 138 mg/dL — ABNORMAL HIGH (ref 65–99)
POTASSIUM: 3.7 mmol/L (ref 3.5–5.1)
Sodium: 141 mmol/L (ref 135–145)
Total Bilirubin: 0.9 mg/dL (ref 0.3–1.2)
Total Protein: 6.1 g/dL — ABNORMAL LOW (ref 6.5–8.1)

## 2015-02-24 LAB — GLUCOSE, CAPILLARY
GLUCOSE-CAPILLARY: 123 mg/dL — AB (ref 65–99)
GLUCOSE-CAPILLARY: 98 mg/dL (ref 65–99)
GLUCOSE-CAPILLARY: 136 mg/dL — AB (ref 65–99)
GLUCOSE-CAPILLARY: 143 mg/dL — AB (ref 65–99)
Glucose-Capillary: 151 mg/dL — ABNORMAL HIGH (ref 65–99)
Glucose-Capillary: 101 mg/dL — ABNORMAL HIGH (ref 65–99)

## 2015-02-24 LAB — CBC
HEMATOCRIT: 27.3 % — AB (ref 39.0–52.0)
HEMOGLOBIN: 9.1 g/dL — AB (ref 13.0–17.0)
MCH: 31.2 pg (ref 26.0–34.0)
MCHC: 33.3 g/dL (ref 30.0–36.0)
MCV: 93.5 fL (ref 78.0–100.0)
Platelets: 377 10*3/uL (ref 150–400)
RBC: 2.92 MIL/uL — AB (ref 4.22–5.81)
RDW: 21.1 % — ABNORMAL HIGH (ref 11.5–15.5)
WBC: 9.8 10*3/uL (ref 4.0–10.5)

## 2015-02-24 LAB — MAGNESIUM: MAGNESIUM: 1.7 mg/dL (ref 1.7–2.4)

## 2015-02-24 LAB — TOTAL BILIRUBIN, BODY FLUID: TOTBILIFLUID: 3 mg/dL

## 2015-02-24 LAB — PHOSPHORUS: PHOSPHORUS: 3 mg/dL (ref 2.5–4.6)

## 2015-02-24 LAB — CALCIUM, IONIZED: CALCIUM, IONIZED, SERUM: 4.9 mg/dL (ref 4.5–5.6)

## 2015-02-24 MED ORDER — POTASSIUM CHLORIDE 10 MEQ/50ML IV SOLN
10.0000 meq | INTRAVENOUS | Status: AC
  Administered 2015-02-24 (×4): 10 meq via INTRAVENOUS
  Filled 2015-02-24 (×4): qty 50

## 2015-02-24 MED ORDER — INSULIN ASPART 100 UNIT/ML ~~LOC~~ SOLN
0.0000 [IU] | Freq: Three times a day (TID) | SUBCUTANEOUS | Status: DC
  Administered 2015-02-24 – 2015-02-25 (×2): 1 [IU] via SUBCUTANEOUS
  Administered 2015-02-26 (×2): 2 [IU] via SUBCUTANEOUS

## 2015-02-24 MED ORDER — FAT EMULSION 20 % IV EMUL
240.0000 mL | INTRAVENOUS | Status: AC
  Administered 2015-02-24: 240 mL via INTRAVENOUS
  Filled 2015-02-24: qty 250

## 2015-02-24 MED ORDER — MAGNESIUM SULFATE IN D5W 10-5 MG/ML-% IV SOLN
1.0000 g | Freq: Once | INTRAVENOUS | Status: AC
  Administered 2015-02-24: 1 g via INTRAVENOUS
  Filled 2015-02-24: qty 100

## 2015-02-24 MED ORDER — INSULIN GLARGINE 100 UNIT/ML ~~LOC~~ SOLN
15.0000 [IU] | Freq: Every day | SUBCUTANEOUS | Status: DC
Start: 2015-02-25 — End: 2015-02-26
  Administered 2015-02-25 – 2015-02-26 (×2): 15 [IU] via SUBCUTANEOUS
  Filled 2015-02-24 (×2): qty 0.15

## 2015-02-24 MED ORDER — HYDROCORTISONE NA SUCCINATE PF 100 MG IJ SOLR
50.0000 mg | Freq: Every day | INTRAMUSCULAR | Status: DC
  Administered 2015-02-25 – 2015-02-26 (×2): 50 mg via INTRAVENOUS
  Filled 2015-02-24 (×2): qty 2

## 2015-02-24 MED ORDER — NEPRO/CARBSTEADY PO LIQD
237.0000 mL | Freq: Two times a day (BID) | ORAL | Status: DC
  Filled 2015-02-24 (×4): qty 237

## 2015-02-24 MED ORDER — TRACE MINERALS CR-CU-MN-SE-ZN 10-1000-500-60 MCG/ML IV SOLN
INTRAVENOUS | Status: AC
  Administered 2015-02-24: 18:00:00 via INTRAVENOUS
  Filled 2015-02-24: qty 960

## 2015-02-24 NOTE — Progress Notes (Signed)
Subjective: Sitting on bedpan. Denies n/v/abd pain. Ate 100% grilled cheese, 2 puddings, 1/2 potato soup, sprite, a few green beans yesterday  Objective: Vital signs in last 24 hours: Temp:  [97.7 F (36.5 C)-98.8 F (37.1 C)] 97.7 F (36.5 C) (09/08 0800) Pulse Rate:  [89-110] 104 (09/08 0800) Resp:  [19-31] 30 (09/08 0800) BP: (116-150)/(58-77) 139/58 mmHg (09/08 0800) SpO2:  [95 %-97 %] 97 % (09/08 0800) Last BM Date: 02/24/15  Intake/Output from previous day: 09/07 0701 - 09/08 0700 In: 2458.6 [I.V.:270; IV Piggyback:550; TPN:1638.6] Out: 875 [Urine:850; Drains:25] Intake/Output this shift: Total I/O In: 120 [I.V.:20; TPN:100] Out: 125 [Urine:125]  Alert, oriented x3 Soft, obese, nontender  Lab Results:   Recent Labs  02/23/15 0550 02/24/15 0455  WBC 11.8* 9.8  HGB 8.9* 9.1*  HCT 28.3* 27.3*  PLT 371 377   BMET  Recent Labs  02/23/15 0550 02/24/15 0455  NA 141 141  K 3.3* 3.7  CL 110 114*  CO2 21* 20*  GLUCOSE 152* 138*  BUN 99* 94*  CREATININE 2.73* 2.42*  CALCIUM 8.3* 8.1*   PT/INR No results for input(s): LABPROT, INR in the last 72 hours. ABG No results for input(s): PHART, HCO3 in the last 72 hours.  Invalid input(s): PCO2, PO2  Studies/Results: No results found.  Anti-infectives: Anti-infectives    Start     Dose/Rate Route Frequency Ordered Stop   02/23/15 1200  cefTRIAXone (ROCEPHIN) 2 g in dextrose 5 % 50 mL IVPB     2 g 100 mL/hr over 30 Minutes Intravenous Every 24 hours 02/23/15 0956     02/23/15 1200  metroNIDAZOLE (FLAGYL) tablet 500 mg     500 mg Oral 4 times per day 02/23/15 0956     02/17/15 1400  vancomycin (VANCOCIN) IVPB 1000 mg/200 mL premix     1,000 mg 200 mL/hr over 60 Minutes Intravenous  Once 02/17/15 1320 02/17/15 1502   02/16/15 2200  piperacillin-tazobactam (ZOSYN) IVPB 3.375 g  Status:  Discontinued     3.375 g 12.5 mL/hr over 240 Minutes Intravenous 3 times per day 02/16/15 1603 02/23/15 0956   02/15/15 2200  meropenem (MERREM) 500 mg in sodium chloride 0.9 % 50 mL IVPB  Status:  Discontinued     500 mg 100 mL/hr over 30 Minutes Intravenous Every 12 hours 02/15/15 1415 02/16/15 1533   02/14/15 1200  vancomycin (VANCOCIN) IVPB 1000 mg/200 mL premix  Status:  Discontinued     1,000 mg 200 mL/hr over 60 Minutes Intravenous Every 24 hours 02/13/15 0920 02/16/15 0706   02/13/15 1000  vancomycin (VANCOCIN) 1,750 mg in sodium chloride 0.9 % 500 mL IVPB     1,750 mg 250 mL/hr over 120 Minutes Intravenous  Once 02/13/15 0836 02/13/15 1210   02/12/15 1630  meropenem (MERREM) 500 mg in sodium chloride 0.9 % 50 mL IVPB  Status:  Discontinued     500 mg 100 mL/hr over 30 Minutes Intravenous Every 6 hours 02/12/15 1106 02/15/15 1415   02/11/15 1800  anidulafungin (ERAXIS) 100 mg in sodium chloride 0.9 % 100 mL IVPB  Status:  Discontinued     100 mg over 90 Minutes Intravenous Every 24 hours 02/10/15 1744 02/23/15 1645   02/10/15 1730  anidulafungin (ERAXIS) 200 mg in sodium chloride 0.9 % 200 mL IVPB     200 mg over 180 Minutes Intravenous  Once 02/10/15 1720 02/10/15 2230   02/10/15 0200  meropenem (MERREM) 500 mg in sodium chloride 0.9 % 50 mL  IVPB  Status:  Discontinued     500 mg 100 mL/hr over 30 Minutes Intravenous Every 8 hours 02/09/15 1808 02/12/15 1106   02/10/15 0000  meropenem (MERREM) 500 mg in sodium chloride 0.9 % 50 mL IVPB  Status:  Discontinued     500 mg 100 mL/hr over 30 Minutes Intravenous 4 times per day 02/09/15 1655 02/09/15 1808   02/09/15 1800  meropenem (MERREM) 1 g in sodium chloride 0.9 % 100 mL IVPB     1 g 200 mL/hr over 30 Minutes Intravenous  Once 02/09/15 1655 02/09/15 1818   02/08/15 1800  piperacillin-tazobactam (ZOSYN) IVPB 3.375 g  Status:  Discontinued     3.375 g 100 mL/hr over 30 Minutes Intravenous 4 times per day 02/08/15 1403 02/09/15 1655   02/07/15 1400  piperacillin-tazobactam (ZOSYN) IVPB 2.25 g  Status:  Discontinued     2.25 g 100 mL/hr  over 30 Minutes Intravenous 3 times per day 02/07/15 0750 02/08/15 1403   02/04/15 1200  piperacillin-tazobactam (ZOSYN) IVPB 2.25 g  Status:  Discontinued     2.25 g 100 mL/hr over 30 Minutes Intravenous 4 times per day 02/04/15 0859 02/04/15 0905   02/04/15 1200  piperacillin-tazobactam (ZOSYN) IVPB 3.375 g  Status:  Discontinued     3.375 g 12.5 mL/hr over 240 Minutes Intravenous Every 8 hours 02/04/15 0905 02/07/15 0750   02/02/15 2000  fluconazole (DIFLUCAN) tablet 100 mg     100 mg Oral Every 24 hours 02/02/15 1908 02/08/15 2041   02/02/15 1800  fluconazole (DIFLUCAN) tablet 100 mg  Status:  Discontinued     100 mg Oral Daily 02/02/15 1545 02/02/15 1901   01/31/15 1000  fluconazole (DIFLUCAN) tablet 100 mg  Status:  Discontinued     100 mg Oral Daily 01/31/15 0727 02/02/15 1313   01/29/15 1700  piperacillin-tazobactam (ZOSYN) IVPB 2.25 g  Status:  Discontinued     2.25 g 100 mL/hr over 30 Minutes Intravenous Every 8 hours 01/29/15 0845 02/04/15 0859   01/24/15 2200  vancomycin (VANCOCIN) IVPB 1000 mg/200 mL premix     1,000 mg 200 mL/hr over 60 Minutes Intravenous  Once 01/24/15 1349 01/24/15 2301   01/21/15 2000  vancomycin (VANCOCIN) 1,500 mg in sodium chloride 0.9 % 500 mL IVPB  Status:  Discontinued     1,500 mg 250 mL/hr over 120 Minutes Intravenous Every 24 hours 01/21/15 1048 01/23/15 1726   01/20/15 2000  vancomycin (VANCOCIN) 1,250 mg in sodium chloride 0.9 % 250 mL IVPB  Status:  Discontinued     1,250 mg 166.7 mL/hr over 90 Minutes Intravenous Every 24 hours 01/19/15 1945 01/20/15 0843   01/20/15 2000  vancomycin (VANCOCIN) 1,250 mg in sodium chloride 0.9 % 250 mL IVPB  Status:  Discontinued     1,250 mg 166.7 mL/hr over 90 Minutes Intravenous Every 24 hours 01/20/15 1519 01/21/15 1048   01/20/15 0000  piperacillin-tazobactam (ZOSYN) IVPB 3.375 g  Status:  Discontinued     3.375 g 12.5 mL/hr over 240 Minutes Intravenous Every 8 hours 01/19/15 1946 01/29/15 0845    01/19/15 1715  vancomycin (VANCOCIN) 2,500 mg in sodium chloride 0.9 % 500 mL IVPB     2,500 mg 250 mL/hr over 120 Minutes Intravenous  Once 01/19/15 1707 01/19/15 2159   01/19/15 1715  piperacillin-tazobactam (ZOSYN) IVPB 3.375 g     3.375 g 100 mL/hr over 30 Minutes Intravenous  Once 01/19/15 1707 01/19/15 1900      Assessment/Plan: PO intake  is slowly improving. If does ok with PO today, can start weaning TPN on Friday Wean stress dose steroids Pt/ot pulm toilet Cont abx per ID for infected ascites - duration?? scds only Can transfer to floor.   Gave wife update via phone. Discussed with her discharge planning. Apparently pt initially declined SNF/rehab yesterday but is now agreeable. i agree with rehab - not safe for dc straight to home. Advised her that if no setbacks over the next day or so that discharge would probably be within next 5 days. Advised her to visit places that case manager mentioned he qualified for  [employer]. [name], MD, FACS General, Bariatric, & Minimally Invasive Surgery [facility], [place]   LOS: 36 days    [name] 02/24/2015

## 2015-02-24 NOTE — Progress Notes (Signed)
PROGRESS NOTE    Larry [name]:[id number] DOB: [birth year] DOA: 01/19/2015 PCP: [name]  HPI/Brief narrative 69 year old male with PMH of A. fib on eliquis, HTN, HLD, 2, initially hospitalized 10/05/14 with sepsis due to acute cholecystitis, PC drain by IR on 10/06/14, DC'ed home on 10/11/2014. He was readmitted 12/30/2014 and underwent Lap cholecystectomy with IOC on 12/30/14. He presented to [facility] [place] on 01/19/15 with abdominal pain and was transferred and admitted to [facility] by general surgery for septic shock related to infected hematoma. IR, CCS and ID continued to follow. Drain upsized 02/04/15. Status post 3 units PRBC for suspected slow intraperitoneal hemorrhage from cholecystectomy site. Developed acute renal failure, nephrology consulted and started CRRT on 02/08/15. Primary care transferred to [facility] starting 02/08/15 d/t acute respiratory failure, delirium. Has been off pressors for a couple of days. Extubated and stable. TRH resumed primary care 02/23/15 and CCM signed off.   Assessment/Plan:  Acute hypoxic respiratory failure -Secondary to significant agitation, renal failure/possible uremia and delirium (resolved) - Patient has chronically elevated right hemidiaphragm. - Patient was on ventilatory support 8/26 > 8/29. Extubated and stable.  Septic shock - Off vasodepressors and stable. Resolved. - Primary source of sepsis: Infected hematoma from recent cholecystectomy and infected ascitic fluid.  Paroxysmal A. Fib - ChadVasc score: 4 - Currently in sinus rhythm. We will wait to reassess regarding starting anticoagulants-surgery to weigh in.  Adrenal insufficiency - Weaning steroids to off  Acute kidney injury - In the setting of septic shock, prior hypotension/ATN - Status post CRRT. HD catheter out. DC Foley catheter 9/7. - Nephrology following. Expect slow improvement.  Hypokalemia/hypomagnesemia - Replace and follow as needed.  Infected  hematoma status post cholecystectomy/peritonitis - Status post CT-guided percutaneous drain with subsequent upsized to 16 [place] - Paracentesis 8/31 consistent with peritonitis - Improving. Diet advanced to full liquids by general surgery on 9/7. Past swallow evaluation 9/6-cleared for regular diet consistency and thin liquids. - TNA being tapered down as diet improves -  ID following. Ascitic fluid cultures show Klebsiella-antifungals discontinued. As per ID follow-up, prefer to keep on IV Rocephin and oral Flagyl while he is hospitalized or in a SNF for several weeks.  GERD - PPI  Protein calorie malnutrition - On TNA-being tapered as oral intake improves.  Acute blood loss anemia in the setting of intra-abdominal hemorrhage - Status post 6 units PRBC since admission. Stable. Follow CBCs.  Diabetes mellitus  - continue Lantus and SSI. Too tightly controlled. Reduce Lantus and change SSI to sensitive  Delirium  - Secondary to critical illness and ICU care. Improving or even resolved.   Essential hypertension - Controlled  Ileus - Resolved  Functional quadriplegia - Secondary to acute illness and prolonged hospitalization. Work with PT. Likely SNF at discharge.   DVT prophylaxis: SCDs  Code Status: Full Family Communication: None at bedside  Disposition Plan: Transfer to telemetry on 9/8. DC to SNF possibly early next week.   SIGNIFICANT EVENTS: 8/03 Admitted w/ abd pain and hypotension  8/04 hypotensive over-night. PERC drain of GB fossa. Found what looked like old infected hematoma. PCCM asked to see post-op for shock 8/05 off pressors. WOB worse.  8/06 WOB & wheezing slightly worse. Progressing abdominal pain 8/07 Hgb declining w/ increasing intraperitoneal hemorrhage 8/07 Transfused 2u PRBCs 8/09 work of breathing still significant w/ marked upper airway component  8/10 breathing better. 1.6 liters negative. Slight bump in scr. Vanc stopped. Getting OOB  for first time  8/11 eating regular diet. Up in chair. Feeling better. Respiratory status improved. PCCM s/o 8/14 Renal consulted for rising sr cr (to 3.60), baseline sr cr 1.1-1.5 (prior IV contrast 7/28).  8/14 Concern for yeast / bacterial UTI  8/15 ID consulted > abx narrowed to zosyn only, fluconazole for yeast  8/16 UOP increased on IV lasix 8/18 NPO, sr cr improving, CT abd as above  8/18 GB drain upsized per IR to 16 Fr 8/19 Abd pain, eating well. Diarrhea after ensure. To PO lasix. Discussed gastrostomy tube with IR 8/22 Sr Cr rising, intermittent vomiting. CT abd as above. Increased confusion 8/23 HD cath inserted, CRRT initiated. Gastrostomy tube held off due to CRRT & Hgb drop 8/24 WBC elevated, cultures repeated.  8/24 Paracentesis >> 150 ml "bloody bile" fluid removed, loculated and difficult to aspirate 8/25 Palliative care consulted, PCCM consulted. Vomiting with NGT insertion.  8/26 Intubated early am(0200) for desaturations, significant agitation, delirium  8/30 CRRT stopped, bilious vomiting/secretions 8/31 Paracentesis >> 3.1 L 9/01 Transfused 1 U PRBC 9/04 vaso re added, improved neo off 9/06 SLP evaluation >> cleared for regular diet, thin liquids  Consultants:  CCM  Nephrology  General surgery  Infectious disease  Palliative care medicine  Interventional radiology  Procedures:  RUQ drain  RUE PICC line  Foley catheter-DC'd 9/7  HD catheter placement in left IJ on 8/23-DC'd  Paracentesis by IR on 8/24 & 8/31   Antibiotics: Meropenem 8/24 >>  Fluconazole 8/15 >> 8/23 anidulafungin 8/26 >> vanc 8/28 >> 8/31 Zosyn 8/31 >>    Subjective: Continues to feel better. Tolerated soft diet yesterday. Having multiple loose BMs but better than 2 days ago. No abdominal pain or dyspnea reported.  Objective: Filed Vitals:   02/24/15 1435 02/24/15 1500 02/24/15 1600 02/24/15 1700  BP:   101/80   Pulse:  108 91 101   Temp:   97.6 F (36.4 C)   TempSrc:   Oral   Resp:  26 25 26   Height:      Weight:      SpO2: 96% 95% 97% 97%    Intake/Output Summary (Last 24 hours) at 02/24/15 1753 Last data filed at 02/24/15 1700  Gross per 24 hour  Intake 1658.6 ml  Output    700 ml  Net  958.6 ml   Filed Weights   02/21/15 0530 02/22/15 0322 02/23/15 0430  Weight: 111.1 kg (244 lb 14.9 oz) 112.4 kg (247 lb 12.8 oz) 112.5 kg (248 lb 0.3 oz)     Exam:  General exam: Moderately built and obese middle-aged male lying comfortably propped up in bed. Respiratory system: reduced breath sounds in the bases but otherwise clear to auscultation. No increased work of breathing. Cardiovascular system: S1 & S2 heard, RRR. No JVD, murmurs, gallops, clicks. Trace bilateral ankle edema. Gastrointestinal system: Abdomen is nondistended, soft and nontender. Normal bowel sounds heard. RUQ drain +. Central nervous system: Alert and oriented. No focal neurological deficits. Extremities: Symmetric 5 x 5 power. RUE PICC line +    Data Reviewed: Basic Metabolic Panel:  Recent Labs Lab 02/20/15 1700  02/21/15 0555 02/21/15 1730 02/22/15 0515 02/22/15 2018 02/23/15 0550 02/24/15 0455  NA  --   < > 138 138 139 139 141 141  K  --   < > 3.2* 3.0* 3.2* 3.8 3.3* 3.7  CL  --   < > 106 107 108 109 110 114*  CO2  --   < > 23 22 21* 21* 21*  20*  GLUCOSE  --   < > 207* 176* 275* 246* 152* 138*  BUN  --   < > 102* 100* 108* 103* 99* 94*  CREATININE  --   < > 3.28* 3.22* 3.02* 2.92* 2.73* 2.42*  CALCIUM  --   < > 7.9* 7.9* 8.0* 8.2* 8.3* 8.1*  MG 1.8  --  1.9  --  1.7  --  1.8 1.7  PHOS  --   --  4.8* 4.4 4.3 3.1 3.1 3.0  < > = values in this interval not displayed. Liver Function Tests:  Recent Labs Lab 02/21/15 0555 02/21/15 1730 02/22/15 0515 02/22/15 2018 02/23/15 0550 02/24/15 0455  AST 29  --   --   --   --  27  ALT 18  --   --   --   --  25  ALKPHOS 113  --   --   --   --  114  BILITOT 1.2  --   --   --    --  0.9  PROT 6.1*  --   --   --   --  6.1*  ALBUMIN 2.0* 2.0* 1.9* 2.1* 2.0* 2.0*   No results for input(s): LIPASE, AMYLASE in the last 168 hours. No results for input(s): AMMONIA in the last 168 hours. CBC:  Recent Labs Lab 02/20/15 0450 02/21/15 0555 02/22/15 0515 02/23/15 0550 02/24/15 0455  WBC 12.3* 10.5 10.1 11.8* 9.8  NEUTROABS  --  9.5*  --   --   --   HGB 8.0* 6.8* 8.6* 8.9* 9.1*  HCT 24.9* 21.5* 27.6* 28.3* 27.3*  MCV 92.9 93.5 93.6 93.4 93.5  PLT 281 298 327 371 377   Cardiac Enzymes: No results for input(s): CKTOTAL, CKMB, CKMBINDEX, TROPONINI in the last 168 hours. BNP (last 3 results) No results for input(s): PROBNP in the last 8760 hours. CBG:  Recent Labs Lab 02/24/15 0123 02/24/15 0436 02/24/15 0832 02/24/15 1212 02/24/15 1631  GLUCAP 151* 123* 98 101* 136*    Recent Results (from the past 240 hour(s))  Culture, body fluid-bottle     Status: None   Collection Time: 02/16/15  3:56 PM  Result Value Ref Range Status   Specimen Description FLUID PERITONEAL  Final   Special Requests NONE  Final   Gram Stain   Final    GRAM NEGATIVE RODS ANAEROBIC BOTTLE ONLY CRITICAL RESULT CALLED TO, READ BACK BY AND VERIFIED WITH: Mellody Life RN [phone] GREEN R CONFIRMED BY K. BARR    Culture   Final    KLEBSIELLA PNEUMONIAE Performed at [facility]    Report Status 02/23/2015 FINAL  Final   Organism ID, Bacteria KLEBSIELLA PNEUMONIAE  Final      Susceptibility   Klebsiella pneumoniae - MIC*    AMPICILLIN >=32 RESISTANT Resistant     CEFAZOLIN >=64 RESISTANT Resistant     CEFEPIME <=1 SENSITIVE Sensitive     CEFTAZIDIME 4 SENSITIVE Sensitive     CEFTRIAXONE <=1 SENSITIVE Sensitive     CIPROFLOXACIN <=0.25 SENSITIVE Sensitive     GENTAMICIN <=1 SENSITIVE Sensitive     IMIPENEM <=0.25 SENSITIVE Sensitive     TRIMETH/SULFA <=20 SENSITIVE Sensitive     AMPICILLIN/SULBACTAM >=32 RESISTANT Resistant     PIP/TAZO >=128 RESISTANT Resistant     *  KLEBSIELLA PNEUMONIAE  Gram stain     Status: None   Collection Time: 02/16/15  3:56 PM  Result Value Ref Range Status   Specimen  Description FLUID PERITONEAL  Final   Special Requests NONE  Final   Gram Stain   Final    ABUNDANT WBC PRESENT,BOTH PMN AND MONONUCLEAR NO ORGANISMS SEEN Performed at [facility]    Report Status 02/16/2015 FINAL  Final  C difficile quick scan w PCR reflex     Status: None   Collection Time: 02/17/15 12:00 PM  Result Value Ref Range Status   C Diff antigen NEGATIVE NEGATIVE Final   C Diff toxin NEGATIVE NEGATIVE Final   C Diff interpretation Negative for toxigenic C. difficile  Final           Studies: No results found.      Scheduled Meds: . antiseptic oral rinse  7 mL Mouth Rinse q12n4p  . cefTRIAXone (ROCEPHIN)  IV  2 g Intravenous Q24H  . chlorhexidine  15 mL Mouth Rinse BID  . darbepoetin (ARANESP) injection - NON-DIALYSIS  200 mcg Subcutaneous Q Wed-1800  . feeding supplement (NEPRO CARB STEADY)  237 mL Oral BID BM  . [START ON 02/25/2015] hydrocortisone sodium succinate  50 mg Intravenous Q breakfast  . Influenza vac split quadrivalent PF  0.5 mL Intramuscular Tomorrow-1000  . insulin aspart  0-9 Units Subcutaneous TID WC  . [START ON 02/25/2015] insulin glargine  15 Units Subcutaneous Daily  . ipratropium  0.5 mg Nebulization Q6H  . levalbuterol  0.63 mg Nebulization Q6H  . metroNIDAZOLE  500 mg Oral 4 times per day  . nystatin   Topical TID  . pantoprazole (PROTONIX) IV  40 mg Intravenous Q24H  . sodium chloride  10-40 mL Intracatheter Q12H   Continuous Infusions: . sodium chloride 10 mL/hr at 02/24/15 1700  . Marland KitchenTPN (CLINIMIX-E) Adult Stopped (02/24/15 1748)   And  . fat emulsion Stopped (02/24/15 1749)  . Marland KitchenTPN (CLINIMIX-E) Adult 40 mL/hr at 02/24/15 1748   And  . fat emulsion 240 mL (02/24/15 1748)    Principal Problem:   Intra-abdominal abscess Active Problems:   Septic shock   Anticoagulated   Paroxysmal  a-fib   Acute blood loss anemia   Catheter-associated urinary tract infection   Intra-abdominal infection   Candidiasis of urogenital site   Acute respiratory failure with hypoxia   Infected hematoma GB fossa of liver   Leukocytosis   Diabetes mellitus with renal complications   Anemia of chronic disease   Hypokalemia   Hypomagnesemia   Acute renal failure   Protein-calorie malnutrition, severe   AKI (acute kidney injury)   Encounter for palliative care   Acute encephalopathy    Time spent: 15 minutes.    Marcellus Scott, MD, FACP, FHM. [facility] Pager [phone]  If 7PM-7AM, please contact night-coverage [email or web address] Password [email or web address] 02/24/2015, 5:53 PM    LOS: 36 days
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
## 2015-02-24 NOTE — Progress Notes (Signed)
Physical Therapy Treatment Patient Details Name: Larry [name] MRN: [id number] DOB: Mar 05, 1946 Today's Date: 02/24/2015    History of Present Illness 69 year old male w/ CAF on Elliquis who recently underwent lap chole on 7/14. Was discharged home w/drain. Drain removed about a week before presentation to ER. Admitted directly from [facility] on 8/3 w/ septic shock which was likely due to infected hematoma in the GB fossa.  new perc drain placed on 8/4, when he remained hypotensive. Required HD  but now off with improving kidney function. On ventilator 8/26-8/29.    PT Comments    Pt progressing well and very motivated. Assisted OOB to amb a greater distance in hallway while monitoring vitals: Supine:    BP 137/66(86), HR 114, RA 98% and RR 25 Standing:  BP 115/55(72), HR 125, RA 96% and RR 30 AMB:         BP 131/78(93), HR 140, RA 98% and RR 35  Follow Up Recommendations  SNF     Equipment Recommendations       Recommendations for Other Services       Precautions / Restrictions Precautions Precautions: Fall Precaution Comments: JP drain on R, monitor HR, multiple lines Restrictions Weight Bearing Restrictions: No    Mobility  Bed Mobility Overal bed mobility: Needs Assistance Bed Mobility: Supine to Sit     Supine to sit: Mod assist;Min assist     General bed mobility comments: pt demonstrates increased ability to self perform   Transfers Overall transfer level: Needs assistance Equipment used: None Transfers: Sit to/from Stand Sit to Stand: Min assist;+2 safety/equipment         General transfer comment: pt demonstartes increased ability to self perform  Ambulation/Gait Ambulation/Gait assistance: Min assist;Mod assist;+2 physical assistance;+2 safety/equipment Ambulation Distance (Feet):  (3 walks with sitting rest break with a total of 170 feet) Assistive device: Bilateral platform walker (EVA walker)   Gait velocity: decreased   General  Gait Details: Very weak.  Used B platform EVA walker for increased support and to increase activity tolerance/gait distance. tolerated increased distance.  very weak due to medical and extended length of stay.  did tolerate increased distance.   Stairs            Wheelchair Mobility    Modified Rankin (Stroke Patients Only)       Balance                                    Cognition Arousal/Alertness: Awake/alert Behavior During Therapy: WFL for tasks assessed/performed Overall Cognitive Status: Within Functional Limits for tasks assessed                      Exercises      General Comments        Pertinent Vitals/Pain Pain Assessment: No/denies pain    Home Living                      Prior Function            PT Goals (current goals can now be found in the care plan section) Progress towards PT goals: Progressing toward goals    Frequency  Min 3X/week    PT Plan Current plan remains appropriate    Co-evaluation             End of Session Equipment Utilized During  Treatment: Gait belt Activity Tolerance: Patient tolerated treatment well Patient left: in chair;with call bell/phone within reach     Time: 1105-1150 PT Time Calculation (min) (ACUTE ONLY): 45 min  Charges:  $Gait Training: 23-37 mins $Therapeutic Activity: 8-22 mins                    G Codes:      Felecia Shelling  PTA [facility]  Acute  Rehab Pager      [phone]

## 2015-02-24 NOTE — Progress Notes (Signed)
PARENTERAL NUTRITION CONSULT NOTE   Pharmacy Consult for TPN Indication: Prolonged ileus  No Known Allergies  Patient Measurements: Height: 5\' 6"  (167.6 cm) Weight: 248 lb 0.3 oz (112.5 kg) IBW/kg (Calculated) : 63.8 Adjusted Body Weight: 83.2 kg (9/6)  Vital Signs: Temp: 97.7 F (36.5 C) (09/08 0800) Temp Source: Oral (09/08 0800) BP: 139/58 mmHg (09/08 0800) Pulse Rate: 104 (09/08 0800) Intake/Output from previous day: 09/07 0701 - 09/08 0700 In: 2458.6 [I.V.:270; IV Piggyback:550; TPN:1638.6] Out: 875 [Urine:850; Drains:25]  Labs:  Recent Labs  02/22/15 0515 02/23/15 0550 02/24/15 0455  WBC 10.1 11.8* 9.8  HGB 8.6* 8.9* 9.1*  HCT 27.6* 28.3* 27.3*  PLT 327 371 377  APTT 27 29  --      Recent Labs  02/22/15 0515 02/22/15 2018 02/23/15 0550 02/24/15 0455  NA 139 139 141 141  K 3.2* 3.8 3.3* 3.7  CL 108 109 110 114*  CO2 21* 21* 21* 20*  GLUCOSE 275* 246* 152* 138*  BUN 108* 103* 99* 94*  CREATININE 3.02* 2.92* 2.73* 2.42*  CALCIUM 8.0* 8.2* 8.3* 8.1*  MG 1.7  --  1.8 1.7  PHOS 4.3 3.1 3.1 3.0  PROT  --   --   --  6.1*  ALBUMIN 1.9* 2.1* 2.0* 2.0*  AST  --   --   --  27  ALT  --   --   --  25  ALKPHOS  --   --   --  114  BILITOT  --   --   --  0.9   Estimated Creatinine Clearance: 34.4 mL/min (by C-G formula based on Cr of 2.42).    Recent Labs  02/24/15 0123 02/24/15 0436 02/24/15 0832  GLUCAP 151* 123* 98   Insulin Requirements: 30 units of Novolog SSI + Lantus 20 units + 15 units regular insulin provided by TPN  Current Nutrition: Advanced to soft diet on 9/7 (no N/V with CLD, FLD) 9/8 48 hour calorie count in progress  IVF: NS KVO  Central access: PICC  TPN start date:  8/22  ASSESSMENT                                                                                                          HPI: 71 YOM presents with worsening abdominal pain on 8/3.  Recent cholecystectomy on 01/04/2015 with drain removed 2 weeks prior to admission.  Found to have GB fossa fluid collection (infected hematoma) and abscess. IR placed GB fossa drain.  Pharmacy asked to start TPN 8/22 for prolonged ileus and interment vomiting prohibiting use of enteral nutrition.    Significant events:  8/3 septic shock from infected GB fossa 8/22 orders to start TPN 8/23 Start CRRT 8/30 CRRT stopped 9/2: Reglan restarted.  Having multiple BMs.  9/3-9/4: NG output still high.  Holding on clamping trials for now.  9/6: tolerated NG clamp, NG tube d/c.  Started CLD. 9/7 Begin weaning TPN, resume electrolytes in TPN.  Today, 02/24/2015:    Glucose - uncontrolled with CBG range 98-276.  Lantus last  increased 9/5 + SSI + insulin in TPN.  Patient on Lantus 40 units daily PTA. Stress dose steroids tapered off, but restarted 9/3, now tapering again.  Electrolytes:  Na WNL   K 3.7 WNL, has required daily repletion  Mg 1.7, below goal of >2  Phos remains WNL  CorrCa 9.7 WNL.  Renal - nonoliguric AKI s/p CRRT 8/23-8/30.   24h I/O = +1583 mL  SCr improving slowly, but BUN remains elevated  Urine output documented as 0.31 mL/kg/hr yesterday, but x1 unmeasured UOP  LFTs - AST/ALT, Alk Phos, Tbili WNL (9/8)  TGs - 155 (8/23), 175 (8/29), 183 (9/5)  Prealbumin - 5.3 (8/22), 10 (8/29), 17.5 (9/5)  NUTRITIONAL GOALS                                                                                             Updated RD recs 8/31 (off CRRT): 1950-2150 kcals, protein 95-105 g  Clinimix 5/15 at a goal rate of 45ml/hr + 20% fat emulsion at 29ml/hr to provide: 100 g/day protein, 1894 Kcal/day.   PLAN                                                                                                              Magnesium 1g IV once  Potassium 2mEq q1h x 4  At 1800 today:  Continue reduced rate Clinimix E 5/15 at 40 ml/hr.   Wean to ~ 1/2 goal rate as oral diet intake increases.  Originally discussed reduced rate with Dr. Algis Liming, [facility], on 9/7 and orders  were placed prior to [facility] rounding.  Discussed today with Dr. Redmond Pulling, [facility], who OK'd continued reduced rate despite plan in note to hold off on weaning until Friday.    Continue regular insulin in TPN: 15 units of regular insulin in 1L over 24 hour infusion.  Continue 20% lipid emulsion at 10 ml/hr.  Held lipids for first week of ICU admission, resumed 8/30.   TPN to contain standard multivitamins and trace elements.   CBG monitoring, resistant SSI q4h  MD to follow up insulin needs as PO intake increases, steroids decrease, TPN weaned.  TPN lab panels on Mondays & Thursdays.  BMET daily.  Mg and Phos daily per MD.  F/u diet orders and oral intake.  Wean TPN as appropriate for increasing PO nutrition.  Gretta Arab PharmD, BCPS Pager [phone] 02/24/2015 10:54 AM

## 2015-02-24 NOTE — Progress Notes (Signed)
CSW spoke with pt's spouse this am to assist with d/c planning. Pt is now in agreement with plan for ST Rehab. Pt / spouse have chosen [facility] [place]. SNF contacted and is able to offer ST Rehab bed once TNA is d/c. CSW will continue to follow to assist with d/c planning to SNF.  Cori Razor LCSW [phone]

## 2015-02-24 NOTE — Progress Notes (Signed)
Calorie Count Note  48 hour calorie count ordered. Day 1 results below.  Diet: soft Supplements: none  9/7-9/8: Breakfast: none Lunch: 396 kcal, 13g protein Dinner: 399 kcal, 17g protein Supplements: none  Estimated Nutritional Needs:   Kcal: 1950-2150  Protein: 95-105g  Fluid: 2L/day  Total intake: 795 kcal (41% of minimum estimated needs)  30g protein (32% of minimum estimated needs)  Nutrition Dx: Inadequate oral intake related to poor appetite as evidenced by meal completion <50%.  Goal: Pt to meet >/= 90% of their estimated nutrition needs   Intervention:  - Continue TPN per pharmacy -Provide Nepro Shake po BID, each supplement provides 425 kcal and 19 grams protein -Continue Calorie Count -RD to continue to monitor  Tilda Franco, MS, RD, LDN Pager: [phone] After Hours Pager: [phone]

## 2015-02-25 LAB — PHOSPHORUS: Phosphorus: 3.1 mg/dL (ref 2.5–4.6)

## 2015-02-25 LAB — GLUCOSE, CAPILLARY
GLUCOSE-CAPILLARY: 131 mg/dL — AB (ref 65–99)
GLUCOSE-CAPILLARY: 115 mg/dL — AB (ref 65–99)
Glucose-Capillary: 88 mg/dL (ref 65–99)
Glucose-Capillary: 123 mg/dL — ABNORMAL HIGH (ref 65–99)
Glucose-Capillary: 137 mg/dL — ABNORMAL HIGH (ref 65–99)
Glucose-Capillary: 173 mg/dL — ABNORMAL HIGH (ref 65–99)

## 2015-02-25 LAB — CBC
HEMATOCRIT: 30.3 % — AB (ref 39.0–52.0)
HEMOGLOBIN: 9.8 g/dL — AB (ref 13.0–17.0)
MCH: 30.3 pg (ref 26.0–34.0)
MCHC: 32.3 g/dL (ref 30.0–36.0)
MCV: 93.8 fL (ref 78.0–100.0)
Platelets: 401 10*3/uL — ABNORMAL HIGH (ref 150–400)
RBC: 3.23 MIL/uL — ABNORMAL LOW (ref 4.22–5.81)
RDW: 20.9 % — ABNORMAL HIGH (ref 11.5–15.5)
WBC: 5.5 10*3/uL (ref 4.0–10.5)

## 2015-02-25 LAB — BASIC METABOLIC PANEL
Anion gap: 7 (ref 5–15)
BUN: 77 mg/dL — AB (ref 6–20)
CALCIUM: 8.2 mg/dL — AB (ref 8.9–10.3)
CO2: 20 mmol/L — ABNORMAL LOW (ref 22–32)
CREATININE: 2.11 mg/dL — AB (ref 0.61–1.24)
Chloride: 116 mmol/L — ABNORMAL HIGH (ref 101–111)
GFR calc Af Amer: 35 mL/min — ABNORMAL LOW (ref >60)
GFR, EST NON AFRICAN AMERICAN: 31 mL/min — AB (ref >60)
GLUCOSE: 101 mg/dL — AB (ref 65–99)
POTASSIUM: 3.6 mmol/L (ref 3.5–5.1)
Sodium: 143 mmol/L (ref 135–145)

## 2015-02-25 LAB — MAGNESIUM: Magnesium: 1.6 mg/dL — ABNORMAL LOW (ref 1.7–2.4)

## 2015-02-25 LAB — LACTATE DEHYDROGENASE, PLEURAL OR PERITONEAL FLUID: LD, Fluid: 8944 U/L

## 2015-02-25 MED ORDER — ENSURE ENLIVE PO LIQD: 237.0000 mL | Freq: Two times a day (BID) | ORAL | Status: DC

## 2015-02-25 MED ORDER — PANTOPRAZOLE SODIUM 40 MG PO TBEC
40.0000 mg | DELAYED_RELEASE_TABLET | Freq: Every day | ORAL | Status: DC
  Administered 2015-02-25 – 2015-02-26 (×2): 40 mg via ORAL
  Filled 2015-02-25 (×2): qty 1

## 2015-02-25 MED ORDER — TRACE MINERALS CR-CU-MN-SE-ZN 10-1000-500-60 MCG/ML IV SOLN
INTRAVENOUS | Status: AC
  Administered 2015-02-25: 18:00:00 via INTRAVENOUS
  Filled 2015-02-25: qty 960

## 2015-02-25 MED ORDER — MAGNESIUM SULFATE IN D5W 10-5 MG/ML-% IV SOLN
1.0000 g | Freq: Once | INTRAVENOUS | Status: AC
  Administered 2015-02-25: 1 g via INTRAVENOUS
  Filled 2015-02-25: qty 100

## 2015-02-25 MED ORDER — FAT EMULSION 20 % IV EMUL
120.0000 mL | INTRAVENOUS | Status: DC
  Administered 2015-02-25: 120 mL via INTRAVENOUS
  Filled 2015-02-25: qty 250

## 2015-02-25 MED ORDER — IPRATROPIUM BROMIDE 0.02 % IN SOLN
0.5000 mg | Freq: Two times a day (BID) | RESPIRATORY_TRACT | Status: DC
  Administered 2015-02-26: 0.5 mg via RESPIRATORY_TRACT
  Filled 2015-02-25 (×2): qty 2.5

## 2015-02-25 MED ORDER — POTASSIUM CHLORIDE 10 MEQ/50ML IV SOLN
10.0000 meq | INTRAVENOUS | Status: AC
  Administered 2015-02-25 (×4): 10 meq via INTRAVENOUS
  Filled 2015-02-25 (×4): qty 50

## 2015-02-25 MED ORDER — LEVALBUTEROL HCL 0.63 MG/3ML IN NEBU
0.6300 mg | INHALATION_SOLUTION | Freq: Two times a day (BID) | RESPIRATORY_TRACT | Status: DC
  Administered 2015-02-26: 0.63 mg via RESPIRATORY_TRACT
  Filled 2015-02-25 (×2): qty 3

## 2015-02-25 NOTE — Progress Notes (Signed)
PARENTERAL NUTRITION CONSULT NOTE   Pharmacy Consult for TPN Indication: Prolonged ileus  No Known Allergies  Patient Measurements: Height: 5' 6" (167.6 cm) Weight: 249 lb 9 oz (113.2 kg) IBW/kg (Calculated) : 63.8 Adjusted Body Weight: 83.2 kg (9/6)  Vital Signs: Temp: 97.8 F (36.6 C) (09/09 0436) Temp Source: Oral (09/09 0436) BP: 140/65 mmHg (09/09 0436) Pulse Rate: 100 (09/09 0436) Intake/Output from previous day: 09/08 0701 - 09/09 0700 In: 1645.2 [P.O.:360; I.V.:160; IV Piggyback:350; TPN:770.2] Out: 2930 [Urine:2900; Drains:30]  Labs:  Recent Labs  02/23/15 0550 02/24/15 0455  WBC 11.8* 9.8  HGB 8.9* 9.1*  HCT 28.3* 27.3*  PLT 371 377  APTT 29  --      Recent Labs  02/22/15 2018 02/23/15 0550 02/24/15 0455  NA 139 141 141  K 3.8 3.3* 3.7  CL 109 110 114*  CO2 21* 21* 20*  GLUCOSE 246* 152* 138*  BUN 103* 99* 94*  CREATININE 2.92* 2.73* 2.42*  CALCIUM 8.2* 8.3* 8.1*  MG  --  1.8 1.7  PHOS 3.1 3.1 3.0  PROT  --   --  6.1*  ALBUMIN 2.1* 2.0* 2.0*  AST  --   --  27  ALT  --   --  25  ALKPHOS  --   --  114  BILITOT  --   --  0.9   Estimated Creatinine Clearance: 34.5 mL/min (by C-G formula based on Cr of 2.42).    Recent Labs  02/24/15 1957 02/24/15 2317 02/25/15 0430  GLUCAP 143* 131* 115*   Insulin Requirements: 1 units of Novolog SSI + Lantus 20 units + 15 units regular insulin provided by TPN  Current Nutrition: Advanced to soft diet on 9/7 (no N/V with CLD, FLD) 9/8 48 hour calorie count in progress  IVF: NS KVO  Central access: PICC  TPN start date:  8/22  ASSESSMENT                                                                                                          HPI: 3 YOM presents with worsening abdominal pain on 8/3.  Recent cholecystectomy on 01/04/2015 with drain removed 2 weeks prior to admission. Found to have GB fossa fluid collection (infected hematoma) and abscess. IR placed GB fossa drain.  Pharmacy asked to  start TPN 8/22 for prolonged ileus and interment vomiting prohibiting use of enteral nutrition.    Significant events:  8/3 septic shock from infected GB fossa 8/22 orders to start TPN 8/23 Start CRRT 8/30 CRRT stopped 9/2: Reglan restarted.  Having multiple BMs.  9/3-9/4: NG output still high.  Holding on clamping trials for now.  9/6: tolerated NG clamp, NG tube d/c.  Started CLD. 9/7 Begin weaning TPN, resume electrolytes in TPN.  Today, 02/25/2015:    Glucose - controlled (<150 mg/dL) and required significantly less SSI coverage in the past 24 hours.  uncontrolled with CBG range 98-276.  Continuing current Lantus 20 mg + SSI + insulin in TPN.  Patient on Lantus 40 units daily PTA. Stress dose  steroids tapered off, but restarted 9/3, now tapering again.  Electrolytes:  Na WNL   K 3.6, has required daily repletion  Mg 1.6 despite replacement yesterday with 1g IV Mag sulfate  Phos remains WNL  CorrCa WNL.  Renal - recovering. S/p CRRT 8/23-8/30 for nonoliguric AKI.  UOP 1.1 mL/kg/hr  24h I/O = -1284 mL  SCr improving slowly, BUN decreasing  LFTs - AST/ALT, Alk Phos, Tbili WNL (9/8)  TGs - 155 (8/23), 175 (8/29), 183 (9/5)  Prealbumin - 5.3 (8/22), 10 (8/29), 17.5 (9/5)  NUTRITIONAL GOALS                                                                                             Updated RD recs 9/9: 1950-2150 kcals, protein 95-105 g  Clinimix 5/15 at a goal rate of 51m/hr + 20% fat emulsion at 177mhr to provide: 100 g/day protein, 1894 Kcal/day.   PLAN                                                                                                              Magnesium 1g IV once  Potassium 1068mq1h x 4  At 1800 today:  Continue a reduced rate Clinimix E 5/15 of 40 ml/hr.  Will continue TPN until oral intake improves. Ideally, would continue TPN until oral intake meets at least 60% of nutritional needs. Did not meet that with calorie count yesterday.  May be  able to discontinue TPN tomorrow morning with 2 hour taper if intake improves further today (patient states that he is going to try to eat a 'happy meal' for lunch and tried eggs at breakfast).  Continue regular insulin in TPN: 15 units of regular insulin in 1L over 24 hour infusion.  20% lipid emulsion at 5 ml/hr.  Held lipids for first week of ICU admission, resumed 8/30.   TPN to contain standard multivitamins and trace elements.   CBG monitoring, resistant SSI q4h  MD to follow up insulin needs as PO intake increases, steroids decrease, TPN weaned.  TPN lab panels on Mondays & Thursdays.  BMET in AM.  Mag/Phos ordered daily by MD.  AmaHershal CoriaharmD, BCPS Pager: [phone]/2016 8:55 AM
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
## 2015-02-25 NOTE — Care Management Important Message (Signed)
Important Message  Patient Details  Name: Larry [name] MRN: [id number] Date of Birth: 1946/05/06   Medicare Important Message Given:  Yes-fourth notification given    Haskell Flirt 02/25/2015, 3:15 PMImportant Message  Patient Details  Name: Larry [name] MRN: [id number] Date of Birth: 08-02-1945   Medicare Important Message Given:  Yes-fourth notification given    Haskell Flirt 02/25/2015, 3:15 PM

## 2015-02-25 NOTE — Clinical Social Work Placement (Signed)
CSW confirmed with Herbert Seta at [facility] that they would be able to take patient over the weekend once weaned off TPN. Patient's wife, Crystal aware.   If ready over the weekend, please contact weekend CSW, Rene Kocher (ph#: [phone]) to facilitate discharge.      Lincoln Maxin, LCSW [facility] Clinical Social Worker cell #: [phone]       Lincoln Maxin, LCSW [facility] Clinical Social Worker cell #: [phone]    CLINICAL SOCIAL WORK PLACEMENT  NOTE  Date:  02/25/2015  Patient Details  Name: Larry [name] MRN: [id number] Date of Birth: 08-01-1945  Clinical Social Work is seeking post-discharge placement for this patient at the Skilled  Nursing Facility level of care (*CSW will initial, date and re-position this form in  chart as items are completed):  Yes   Patient/family provided with [facility] Clinical Social Work Department's list of facilities offering this level of care within the geographic area requested by the patient (or if unable, by the patient's family).  Yes   Patient/family informed of their freedom to choose among providers that offer the needed level of care, that participate in Medicare, Medicaid or managed care program needed by the patient, have an available bed and are willing to accept the patient.  Yes   Patient/family informed of [facility]'s ownership interest in [facility] and [facility], as well as of the fact that they are under no obligation to receive care at these facilities.  PASRR submitted to EDS on 02/25/15     PASRR number received on 02/25/15     Existing PASRR number confirmed on       FL2 transmitted to all facilities in geographic area requested by pt/family on 02/25/15     FL2 transmitted to all facilities within larger geographic area on       Patient informed that his/her managed care company has contracts with or will negotiate with certain facilities, including the  following:        Yes   Patient/family informed of bed offers received.  Patient [facility] bed at [facility]     Physician recommends and patient chooses bed at      Patient to be transferred to [facility] on  .  Patient to be transferred to facility by       Patient family notified on   of transfer.  Name of family member notified:        PHYSICIAN       Additional Comment:    _______________________________________________ Arlyss Repress, LCSW 02/25/2015, 10:41 AM

## 2015-02-25 NOTE — Discharge Instructions (Signed)
Bulb Drain Home Care A bulb drain consists of a thin rubber tube and a soft, round bulb that creates a gentle suction. The rubber tube is placed in the area where you had surgery. A bulb is attached to the end of the tube that is outside the body. The bulb drain removes excess fluid that normally builds up in a surgical wound after surgery. The color and amount of fluid will vary. Immediately after surgery, the fluid is bright red and is a little thicker than water. It may gradually change to a yellow or pink color and become more thin and water-like. When the amount decreases to about 1 or 2 tbsp in 24 hours, your health care provider will usually remove it. DAILY CARE  Flush the drain once a day with 5cc saline  Keep the bulb flat (compressed) at all times, except while emptying it. The flatness creates suction. You can flatten the bulb by squeezing it firmly in the middle and then closing the cap.  Keep sites where the tube enters the skin dry and covered with a bandage (dressing).  Secure the tube 1-2 in (2.5-5.1 cm) below the insertion sites to keep it from pulling on your stitches. The tube is stitched in place and will not slip out.  Secure the bulb as directed by your health care provider.  For the first 3 days after surgery, there usually is more fluid in the bulb. Empty the bulb whenever it becomes half full because the bulb does not create enough suction if it is too full. The bulb could also overflow. Write down how much fluid you remove each time you empty your drain. Add up the amount removed in 24 hours.  Empty the bulb at the same time every day once the amount of fluid decreases and you only need to empty it once a day. Write down the amounts and the 24-hour totals to give to your health care provider. This helps your health care provider know when the tubes can be removed. EMPTYING THE BULB DRAIN Before emptying the bulb, get a measuring cup, a piece of paper and a pen, and wash  your hands.  Gently run your fingers down the tube (stripping) to empty any drainage from the tubing into the bulb. This may need to be done several times a day to clear the tubing of clots and tissue.  Open the bulb cap to release suction, which causes it to inflate. Do not touch the inside of the cap.  Gently run your fingers down the tube (stripping) to empty any drainage from the tubing into the bulb.  Hold the cap out of the way, and pour fluid into the measuring cup.   Squeeze the bulb to provide suction.  Replace the cap.   Check the tape that holds the tube to your skin. If it is becoming loose, you can remove the loose piece of tape and apply a new one. Then, pin the bulb to your shirt.   Write down the amount of fluid you emptied out. Write down the date and each time you emptied your bulb drain. (If there are 2 bulbs, note the amount of drainage from each bulb and keep the totals separate. Your health care provider will want to know the total amounts for each drain and which tube is draining more.)   Flush the fluid down the toilet and wash your hands.   Call your health care provider once you have less than 2 tbsp of  fluid collecting in the bulb drain every 24 hours. If there is drainage around the tube site, change dressings and keep the area dry. Cleanse around tube with sterile saline and place dry gauze around site. This gauze should be changed when it is soiled. If it stays clean and unsoiled, it should still be changed daily.  SEEK MEDICAL CARE IF:  Your drainage has a bad smell or is cloudy.   You have a fever.   Your drainage is increasing instead of decreasing.   Your tube fell out.   You have redness or swelling around the tube site.   You have drainage from a surgical wound.   Your bulb drain will not stay flat after you empty it.  MAKE SURE YOU:   Understand these instructions.  Will watch your condition.  Will get help right away if you  are not doing well or get worse. Document Released: 06/01/2000 Document Revised: 10/19/2013 Document Reviewed: 11/07/2011 [facility] Patient Information 2015 [place], [place]. This information is not intended to replace advice given to you by your health care provider. Make sure you discuss any questions you have with your health care provider.

## 2015-02-25 NOTE — Progress Notes (Signed)
Patient ID: Larry [name], male   DOB: Jun 04, 1946, 69 y.o.   MRN: [id number]    Referring Physician(s): [facility]  Chief Complaint: Gallbladder fossa abscess  Subjective:  Pt now out of ICU; denies sig abd pain,N/V  Allergies: Review of patient's allergies indicates no known allergies.  Medications: Prior to Admission medications   Medication Sig Start Date End Date Taking? Authorizing Provider  allopurinol (ZYLOPRIM) 300 MG tablet Take 300 mg by mouth daily.   Yes Historical Provider, MD  amLODipine (NORVASC) 10 MG tablet Take 10 mg by mouth every morning.    Yes Historical Provider, MD  apixaban (ELIQUIS) 5 MG TABS tablet Take 1 tablet (5 mg total) by mouth 2 (two) times daily. 10/01/14  Yes April Palumbo, MD  aspirin 81 MG tablet Take 81 mg by mouth daily.   Yes Historical Provider, MD  atorvastatin (LIPITOR) 80 MG tablet Take 80 mg by mouth at bedtime.    Yes Historical Provider, MD  Cholecalciferol (VITAMIN D PO) Take 2,000 Units by mouth daily.    Yes Historical Provider, MD  citalopram (CELEXA) 40 MG tablet Take 40 mg by mouth every morning.    Yes Historical Provider, MD  furosemide (LASIX) 40 MG tablet Take 40 mg by mouth every morning.    Yes Historical Provider, MD  glucose 4 GM chewable tablet Chew 1 tablet by mouth as needed for low blood sugar (only if BS IS BELOW 70).   Yes Historical Provider, MD  insulin glargine (LANTUS) 100 UNIT/ML injection Inject 40 Units into the skin at bedtime.   Yes Historical Provider, MD  magnesium oxide (MAG-OX) 400 MG tablet Take 400 mg by mouth 2 (two) times daily.    Yes Historical Provider, MD  metFORMIN (GLUCOPHAGE) 500 MG tablet Take 500 mg by mouth 2 (two) times daily with a meal.   Yes Historical Provider, MD  oxyCODONE-acetaminophen (PERCOCET/ROXICET) 5-325 MG per tablet Take 1-2 tablets by mouth every 4 (four) hours as needed for moderate pain. 01/05/15  Yes [name], MD  pantoprazole (PROTONIX) 40 MG tablet Take 40 mg by mouth daily.    Yes Historical Provider, MD  vitamin B-12 (CYANOCOBALAMIN) 500 MCG tablet Take 500 mcg by mouth daily.   Yes Historical Provider, MD  Liraglutide 18 MG/3ML SOPN Inject 1.2 mg into the skin daily. Pt states has been on hold since discharged 10-06-14 hospital visit    Historical Provider, MD  lisinopril (PRINIVIL,ZESTRIL) 40 MG tablet Take 40 mg by mouth 2 (two) times daily. On hold since 10-06-14    Historical Provider, MD  ondansetron (ZOFRAN) 8 MG tablet Take 8 mg by mouth 2 (two) times daily. Not taking-on hold form 10-06-14    Historical Provider, MD     Vital Signs: BP 126/60 mmHg  Pulse 104  Temp(Src) 97.5 F (36.4 C) (Oral)  Resp 18  Ht  (1.676 m)  Wt 249 lb 9 oz (113.2 kg)  BMI 40.30 kg/m2  SpO2 98%  Physical Exam  Awake/alert; GB fossa drain intact, insertion site ok, NT; output about 50 cc's bloody fluid in bulb on exam today; drain irrigated with minimal return  Imaging: Dg Chest Port 1 View  02/22/2015   CLINICAL DATA:  Cough.  Shortness of breath.  EXAM: PORTABLE CHEST - 1 VIEW  COMPARISON:  02/21/2015.  FINDINGS: NG tube and right PICC line in stable position. Cardiomegaly with normal pulmonary vascularity. Low lung volumes with bibasilar atelectasis. No pleural effusion or pneumothorax. Degenerative changes both shoulders.  IMPRESSION: 1. Lines and tubes in stable position. 2. Low lung volumes with bibasilar atelectasis. 3. Stable cardiomegaly.   Electronically Signed   By: Maisie Fus  Register   On: 02/22/2015 07:15    Labs:  CBC:  Recent Labs  02/22/15 0515 02/23/15 0550 02/24/15 0455 02/25/15 0840  WBC 10.1 11.8* 9.8 5.5  HGB 8.6* 8.9* 9.1* 9.8*  HCT 27.6* 28.3* 27.3* 30.3*  PLT 327 371 377 401*    COAGS:  Recent Labs  10/06/14 0445 01/19/15 1613 01/23/15 0754  02/20/15 0450 02/21/15 0555 02/22/15 0515 02/23/15 0550  INR 1.07 1.46 1.32  --   --   --   --   --   APTT 34 35 38*  < > 37 31 27 29   < > = values in this interval not  displayed.  BMP:  Recent Labs  02/22/15 2018 02/23/15 0550 02/24/15 0455 02/25/15 0840  NA 139 141 141 143  K 3.8 3.3* 3.7 3.6  CL 109 110 114* 116*  CO2 21* 21* 20* 20*  GLUCOSE 246* 152* 138* 101*  BUN 103* 99* 94* 77*  CALCIUM 8.2* 8.3* 8.1* 8.2*  CREATININE 2.92* 2.73* 2.42* 2.11*  GFRNONAA 21* 22* 26* 31*  GFRAA 24* 26* 30* 35*    LIVER FUNCTION TESTS:  Recent Labs  02/14/15 0530  02/17/15 0500  02/21/15 0555  02/22/15 0515 02/22/15 2018 02/23/15 0550 02/24/15 0455  BILITOT 2.0*  --  1.7*  --  1.2  --   --   --   --  0.9  AST 55*  --  47*  --  29  --   --   --   --  27  ALT 33  --  26  --  18  --   --   --   --  25  ALKPHOS 100  --  136*  --  113  --   --   --   --  114  PROT 6.5  --  5.7*  --  6.1*  --   --   --   --  6.1*  ALBUMIN 1.9*  1.9*  < > 1.8*  < > 2.0*  < > 1.9* 2.1* 2.0* 2.0*  < > = values in this interval not displayed.  Assessment and Plan: S/p GB fossa abscess drainage 8/4, upsized 8/18 as well as TPA dwells into drain 8/25-26; afebrile; WBC nl; hgb 9.8; creat down to 2.11 (2.42); perit fluid cx's- klebsiella- ID following; cont current tx; pt to f/u with [facility] in 2 weeks   Signed: D. Jeananne Rama 02/25/2015, 3:46 PM   I spent a total of 15 minutes at the the patient's bedside AND on the patient's hospital floor or unit, greater than 50% of which was counseling/coordinating care for GB fossa abscess drain

## 2015-02-25 NOTE — Progress Notes (Signed)
PROGRESS NOTE    Larry [name] WJX:[id number] DOB: [birth year] DOA: 01/19/2015 PCP: [name]  HPI/Brief narrative 69 year old male with PMH of A. fib on eliquis, HTN, HLD, 2, initially hospitalized 10/05/14 with sepsis due to acute cholecystitis, PC drain by IR on 10/06/14, DC'ed home on 10/11/2014. He was readmitted 12/30/2014 and underwent Lap cholecystectomy with IOC on 12/30/14. He presented to [facility] [place] on 01/19/15 with abdominal pain and was transferred and admitted to [facility] by general surgery for septic shock related to infected hematoma. IR, CCS and ID continued to follow. Drain upsized 02/04/15. Status post 3 units PRBC for suspected slow intraperitoneal hemorrhage from cholecystectomy site. Developed acute renal failure, nephrology consulted and started CRRT on 02/08/15. Primary care transferred to [facility] starting 02/08/15 d/t acute respiratory failure, delirium. Has been off pressors for a couple of days. Extubated and stable. TRH resumed primary care 02/23/15 and CCM signed off.   Assessment/Plan:  Acute hypoxic respiratory failure -Secondary to significant agitation, renal failure/possible uremia and delirium (resolved) - Patient has chronically elevated right hemidiaphragm. - Patient was on ventilatory support 8/26 > 8/29. Extubated and stable.  Septic shock - Off vasodepressors and stable. Resolved. - Primary source of sepsis: Infected hematoma from recent cholecystectomy and infected ascitic fluid.  Paroxysmal A. Fib - ChadVasc score: 4 - Currently in sinus rhythm. Discussed with Dr. [name], CCS on 02/25/15: Will be at high risk for intra-abdominal bleeding and hence risks outweigh benefits of starting anticoagulation or aspirin at this time. This has to be revisited in the future.  Adrenal insufficiency - Weaning steroids to off -DC steroids 02/25/15  Acute kidney injury - In the setting of septic shock, prior hypotension/ATN - Status post CRRT. HD  catheter out. DC Foley catheter 9/7. - Expect slow improvement.  Hypokalemia/hypomagnesemia - Replace and follow as needed.  Infected hematoma status post cholecystectomy/peritonitis - Status post CT-guided percutaneous drain with subsequent upsized to 16 [place] - Paracentesis 8/31 consistent with peritonitis - Improving. Diet advanced to full liquids by general surgery on 9/7. Past swallow evaluation 9/6-cleared for regular diet consistency and thin liquids. - TNA being tapered down as diet improves -  ID following. Ascitic fluid cultures show Klebsiella-antifungals discontinued. As per ID follow-up, continue at least 4 more weeks off therapy for his complex intra-abdominal infection with current antibiotic regimen (IV Rocephin and oral Flagyl). ID will arrange outpatient follow-up once he is transferred to SNF.  GERD - PPI  Protein calorie malnutrition - DC TNA in am and encourage PO intake.  Acute blood loss anemia in the setting of intra-abdominal hemorrhage - Status post 6 units PRBC since admission. Stable. Follow CBCs.  Diabetes mellitus  - continue Lantus and SSI. Too tightly controlled. Reduce Lantus and change SSI to sensitive. Better  Delirium  - Secondary to critical illness and ICU care. resolved.   Essential hypertension - Controlled  Ileus - Resolved  Functional quadriplegia - Secondary to acute illness and prolonged hospitalization. Work with PT. SNF at discharge.   DVT prophylaxis: SCDs  Code Status: Full Family Communication: None at bedside  Disposition Plan: Transfer to telemetry on 9/8. DC to SNF over weekend. CCS agrees. Family made aware by CSW.   SIGNIFICANT EVENTS: 8/03 Admitted w/ abd pain and hypotension  8/04 hypotensive over-night. PERC drain of GB fossa. Found what looked like old infected hematoma. PCCM asked to see post-op for shock 8/05 off pressors. WOB worse.  8/06 WOB & wheezing slightly worse. Progressing abdominal  pain 8/07  Hgb declining w/ increasing intraperitoneal hemorrhage 8/07 Transfused 2u PRBCs 8/09 work of breathing still significant w/ marked upper airway component  8/10 breathing better. 1.6 liters negative. Slight bump in scr. Vanc stopped. Getting OOB for first time 8/11 eating regular diet. Up in chair. Feeling better. Respiratory status improved. PCCM s/o 8/14 Renal consulted for rising sr cr (to 3.60), baseline sr cr 1.1-1.5 (prior IV contrast 7/28).  8/14 Concern for yeast / bacterial UTI  8/15 ID consulted > abx narrowed to zosyn only, fluconazole for yeast  8/16 UOP increased on IV lasix 8/18 NPO, sr cr improving, CT abd as above  8/18 GB drain upsized per IR to 16 Fr 8/19 Abd pain, eating well. Diarrhea after ensure. To PO lasix. Discussed gastrostomy tube with IR 8/22 Sr Cr rising, intermittent vomiting. CT abd as above. Increased confusion 8/23 HD cath inserted, CRRT initiated. Gastrostomy tube held off due to CRRT & Hgb drop 8/24 WBC elevated, cultures repeated.  8/24 Paracentesis >> 150 ml "bloody bile" fluid removed, loculated and difficult to aspirate 8/25 Palliative care consulted, PCCM consulted. Vomiting with NGT insertion.  8/26 Intubated early am(0200) for desaturations, significant agitation, delirium  8/30 CRRT stopped, bilious vomiting/secretions 8/31 Paracentesis >> 3.1 L 9/01 Transfused 1 U PRBC 9/04 vaso re added, improved neo off 9/06 SLP evaluation >> cleared for regular diet, thin liquids  Consultants:  CCM  Nephrology  General surgery  Infectious disease  Palliative care medicine  Interventional radiology  Procedures:  RUQ drain  RUE PICC line  Foley catheter-DC'd 9/7  HD catheter placement in left IJ on 8/23-DC'd  Paracentesis by IR on 8/24 & 8/31   Antibiotics: Meropenem 8/24 >>  Fluconazole 8/15 >> 8/23 anidulafungin 8/26 >> vanc 8/28 >> 8/31 Zosyn 8/31 >>    Subjective: No taste. Otherwise no  complaints.  Objective: Filed Vitals:   02/25/15 0026 02/25/15 0436 02/25/15 0751 02/25/15 1009  BP: 141/67 140/65  115/64  Pulse: 100 100  104  Temp: 97.9 F (36.6 C) 97.8 F (36.6 C)  98.2 F (36.8 C)  TempSrc: Oral Oral  Oral  Resp: 19 18  20   Height: 5\' 6"  (1.676 m)     Weight: 113.2 kg (249 lb 9 oz)     SpO2: 98% 98% 97% 99%    Intake/Output Summary (Last 24 hours) at 02/25/15 1337 Last data filed at 02/25/15 1308  Gross per 24 hour  Intake 1503.5 ml  Output   2545 ml  Net -1041.5 ml   Filed Weights   02/22/15 0322 02/23/15 0430 02/25/15 0026  Weight: 112.4 kg (247 lb 12.8 oz) 112.5 kg (248 lb 0.3 oz) 113.2 kg (249 lb 9 oz)     Exam:  General exam: Moderately built and obese middle-aged male sitting up comfortably in chair Respiratory system: reduced breath sounds in the bases but otherwise clear to auscultation. No increased work of breathing. Cardiovascular system: S1 & S2 heard, RRR. No JVD, murmurs, gallops, clicks. Trace bilateral ankle edema. Tele: SR - mild ST in 100's. Gastrointestinal system: Abdomen is nondistended, soft and nontender. Normal bowel sounds heard. RUQ drain +. Central nervous system: Alert and oriented. No focal neurological deficits. Extremities: Symmetric 5 x 5 power. RUE PICC line +    Data Reviewed: Basic Metabolic Panel:  Recent Labs Lab 02/21/15 0555  02/22/15 0515 02/22/15 2018 02/23/15 0550 02/24/15 0455 02/25/15 0840  NA 138  < > 139 139 141 141 143  K 3.2*  < >  3.2* 3.8 3.3* 3.7 3.6  CL 106  < > 108 109 110 114* 116*  CO2 23  < > 21* 21* 21* 20* 20*  GLUCOSE 207*  < > 275* 246* 152* 138* 101*  BUN 102*  < > 108* 103* 99* 94* 77*  CREATININE 3.28*  < > 3.02* 2.92* 2.73* 2.42* 2.11*  CALCIUM 7.9*  < > 8.0* 8.2* 8.3* 8.1* 8.2*  MG 1.9  --  1.7  --  1.8 1.7 1.6*  PHOS 4.8*  < > 4.3 3.1 3.1 3.0 3.1  < > = values in this interval not displayed. Liver Function Tests:  Recent Labs Lab 02/21/15 0555 02/21/15 1730  02/22/15 0515 02/22/15 2018 02/23/15 0550 02/24/15 0455  AST 29  --   --   --   --  27  ALT 18  --   --   --   --  25  ALKPHOS 113  --   --   --   --  114  BILITOT 1.2  --   --   --   --  0.9  PROT 6.1*  --   --   --   --  6.1*  ALBUMIN 2.0* 2.0* 1.9* 2.1* 2.0* 2.0*   No results for input(s): LIPASE, AMYLASE in the last 168 hours. No results for input(s): AMMONIA in the last 168 hours. CBC:  Recent Labs Lab 02/21/15 0555 02/22/15 0515 02/23/15 0550 02/24/15 0455 02/25/15 0840  WBC 10.5 10.1 11.8* 9.8 5.5  NEUTROABS 9.5*  --   --   --   --   HGB 6.8* 8.6* 8.9* 9.1* 9.8*  HCT 21.5* 27.6* 28.3* 27.3* 30.3*  MCV 93.5 93.6 93.4 93.5 93.8  PLT 298 327 371 377 401*   Cardiac Enzymes: No results for input(s): CKTOTAL, CKMB, CKMBINDEX, TROPONINI in the last 168 hours. BNP (last 3 results) No results for input(s): PROBNP in the last 8760 hours. CBG:  Recent Labs Lab 02/24/15 1957 02/24/15 2317 02/25/15 0430 02/25/15 0755 02/25/15 1203  GLUCAP 143* 131* 115* 88 123*    Recent Results (from the past 240 hour(s))  Culture, body fluid-bottle     Status: None   Collection Time: 02/16/15  3:56 PM  Result Value Ref Range Status   Specimen Description FLUID PERITONEAL  Final   Special Requests NONE  Final   Gram Stain   Final    GRAM NEGATIVE RODS ANAEROBIC BOTTLE ONLY CRITICAL RESULT CALLED TO, READ BACK BY AND VERIFIED WITH: Mellody Life RN [phone] GREEN R CONFIRMED BY K. BARR    Culture   Final    KLEBSIELLA PNEUMONIAE Performed at [facility]    Report Status 02/23/2015 FINAL  Final   Organism ID, Bacteria KLEBSIELLA PNEUMONIAE  Final      Susceptibility   Klebsiella pneumoniae - MIC*    AMPICILLIN >=32 RESISTANT Resistant     CEFAZOLIN >=64 RESISTANT Resistant     CEFEPIME <=1 SENSITIVE Sensitive     CEFTAZIDIME 4 SENSITIVE Sensitive     CEFTRIAXONE <=1 SENSITIVE Sensitive     CIPROFLOXACIN <=0.25 SENSITIVE Sensitive     GENTAMICIN <=1 SENSITIVE  Sensitive     IMIPENEM <=0.25 SENSITIVE Sensitive     TRIMETH/SULFA <=20 SENSITIVE Sensitive     AMPICILLIN/SULBACTAM >=32 RESISTANT Resistant     PIP/TAZO >=128 RESISTANT Resistant     * KLEBSIELLA PNEUMONIAE  Gram stain     Status: None   Collection Time: 02/16/15  3:56 PM  Result Value Ref Range Status   Specimen Description FLUID PERITONEAL  Final   Special Requests NONE  Final   Gram Stain   Final    ABUNDANT WBC PRESENT,BOTH PMN AND MONONUCLEAR NO ORGANISMS SEEN Performed at [facility]    Report Status 02/16/2015 FINAL  Final  C difficile quick scan w PCR reflex     Status: None   Collection Time: 02/17/15 12:00 PM  Result Value Ref Range Status   C Diff antigen NEGATIVE NEGATIVE Final   C Diff toxin NEGATIVE NEGATIVE Final   C Diff interpretation Negative for toxigenic C. difficile  Final           Studies: No results found.      Scheduled Meds: . antiseptic oral rinse  7 mL Mouth Rinse q12n4p  . cefTRIAXone (ROCEPHIN)  IV  2 g Intravenous Q24H  . chlorhexidine  15 mL Mouth Rinse BID  . darbepoetin (ARANESP) injection - NON-DIALYSIS  200 mcg Subcutaneous Q Wed-1800  . feeding supplement (NEPRO CARB STEADY)  237 mL Oral BID BM  . hydrocortisone sodium succinate  50 mg Intravenous Q breakfast  . Influenza vac split quadrivalent PF  0.5 mL Intramuscular Tomorrow-1000  . insulin aspart  0-9 Units Subcutaneous TID WC  . insulin glargine  15 Units Subcutaneous Daily  . ipratropium  0.5 mg Nebulization BID  . levalbuterol  0.63 mg Nebulization BID  . metroNIDAZOLE  500 mg Oral 4 times per day  . nystatin   Topical TID  . pantoprazole  40 mg Oral Daily  . potassium chloride  10 mEq Intravenous Q1 Hr x 4  . sodium chloride  10-40 mL Intracatheter Q12H   Continuous Infusions: . sodium chloride Stopped (02/25/15 1110)  . Marland KitchenTPN (CLINIMIX-E) Adult     And  . fat emulsion    . Marland KitchenTPN (CLINIMIX-E) Adult 40 mL/hr at 02/24/15 2320   And  . fat emulsion 240  mL (02/24/15 2320)    Principal Problem:   Intra-abdominal abscess Active Problems:   Septic shock   Anticoagulated   Paroxysmal a-fib   Acute blood loss anemia   Catheter-associated urinary tract infection   Intra-abdominal infection   Candidiasis of urogenital site   Acute respiratory failure with hypoxia   Infected hematoma GB fossa of liver   Leukocytosis   Diabetes mellitus with renal complications   Anemia of chronic disease   Hypokalemia   Hypomagnesemia   Acute renal failure   Protein-calorie malnutrition, severe   AKI (acute kidney injury)   Encounter for palliative care   Acute encephalopathy    Time spent: 15 minutes.    Marcellus Scott, MD, FACP, FHM. [facility] Pager [phone]  If 7PM-7AM, please contact night-coverage [email or web address] Password [email or web address] 02/25/2015, 1:37 PM    LOS: 37 days

## 2015-02-25 NOTE — Progress Notes (Signed)
This patient is receiving IV Protonix. Based on criteria approved by the Pharmacy and Therapeutics Committee, this medication is being converted to the equivalent oral dose form. These criteria include: . The patient is eating (either orally or per tube) and/or has been taking other orally administered medications for at least 24 hours. . This patient has no evidence of active gastrointestinal bleeding or impaired GI absorption (gastrectomy, short bowel, patient on TNA or NPO).  If you have questions about this conversion, please contact the pharmacy department. Thank you.  Clance Boll, PharmD, BCPS Pager: [phone] 02/25/2015 12:50 PM

## 2015-02-25 NOTE — Progress Notes (Signed)
Subjective: No complaints other than that the coke tasted horrible. No n/v. +bms.   Objective: Vital signs in last 24 hours: Temp:  [97.4 F (36.3 C)-98.2 F (36.8 C)] 98.2 F (36.8 C) (09/09 1009) Pulse Rate:  [91-109] 104 (09/09 1009) Resp:  [18-29] 20 (09/09 1009) BP: (101-152)/(64-80) 115/64 mmHg (09/09 1009) SpO2:  [95 %-99 %] 99 % (09/09 1009) Weight:  [113.2 kg (249 lb 9 oz)] 113.2 kg (249 lb 9 oz) (09/09 0026) Last BM Date: 02/24/15  Intake/Output from previous day: 09/08 0701 - 09/09 0700 In: 1645.2 [P.O.:360; I.V.:160; IV Piggyback:350; TPN:770.2] Out: 2930 [Urine:2900; Drains:30] Intake/Output this shift:    Alert, sitting in chair Obese, soft, nd. Drain - old blood; about 20-30cc/day  Lab Results:   Recent Labs  02/24/15 0455 02/25/15 0840  WBC 9.8 5.5  HGB 9.1* 9.8*  HCT 27.3* 30.3*  PLT 377 401*   BMET  Recent Labs  02/24/15 0455 02/25/15 0840  NA 141 143  K 3.7 3.6  CL 114* 116*  CO2 20* 20*  GLUCOSE 138* 101*  BUN 94* 77*  CREATININE 2.42* 2.11*  CALCIUM 8.1* 8.2*   PT/INR No results for input(s): LABPROT, INR in the last 72 hours. ABG No results for input(s): PHART, HCO3 in the last 72 hours.  Invalid input(s): PCO2, PO2  Studies/Results: No results found.  Anti-infectives: Anti-infectives    Start     Dose/Rate Route Frequency Ordered Stop   02/23/15 1200  cefTRIAXone (ROCEPHIN) 2 g in dextrose 5 % 50 mL IVPB     2 g 100 mL/hr over 30 Minutes Intravenous Every 24 hours 02/23/15 0956     02/23/15 1200  metroNIDAZOLE (FLAGYL) tablet 500 mg     500 mg Oral 4 times per day 02/23/15 0956     02/17/15 1400  vancomycin (VANCOCIN) IVPB 1000 mg/200 mL premix     1,000 mg 200 mL/hr over 60 Minutes Intravenous  Once 02/17/15 1320 02/17/15 1502   02/16/15 2200  piperacillin-tazobactam (ZOSYN) IVPB 3.375 g  Status:  Discontinued     3.375 g 12.5 mL/hr over 240 Minutes Intravenous 3 times per day 02/16/15 1603 02/23/15 0956   02/15/15 2200  meropenem (MERREM) 500 mg in sodium chloride 0.9 % 50 mL IVPB  Status:  Discontinued     500 mg 100 mL/hr over 30 Minutes Intravenous Every 12 hours 02/15/15 1415 02/16/15 1533   02/14/15 1200  vancomycin (VANCOCIN) IVPB 1000 mg/200 mL premix  Status:  Discontinued     1,000 mg 200 mL/hr over 60 Minutes Intravenous Every 24 hours 02/13/15 0920 02/16/15 0706   02/13/15 1000  vancomycin (VANCOCIN) 1,750 mg in sodium chloride 0.9 % 500 mL IVPB     1,750 mg 250 mL/hr over 120 Minutes Intravenous  Once 02/13/15 0836 02/13/15 1210   02/12/15 1630  meropenem (MERREM) 500 mg in sodium chloride 0.9 % 50 mL IVPB  Status:  Discontinued     500 mg 100 mL/hr over 30 Minutes Intravenous Every 6 hours 02/12/15 1106 02/15/15 1415   02/11/15 1800  anidulafungin (ERAXIS) 100 mg in sodium chloride 0.9 % 100 mL IVPB  Status:  Discontinued     100 mg over 90 Minutes Intravenous Every 24 hours 02/10/15 1744 02/23/15 1645   02/10/15 1730  anidulafungin (ERAXIS) 200 mg in sodium chloride 0.9 % 200 mL IVPB     200 mg over 180 Minutes Intravenous  Once 02/10/15 1720 02/10/15 2230   02/10/15 0200  meropenem (MERREM)  500 mg in sodium chloride 0.9 % 50 mL IVPB  Status:  Discontinued     500 mg 100 mL/hr over 30 Minutes Intravenous Every 8 hours 02/09/15 1808 02/12/15 1106   02/10/15 0000  meropenem (MERREM) 500 mg in sodium chloride 0.9 % 50 mL IVPB  Status:  Discontinued     500 mg 100 mL/hr over 30 Minutes Intravenous 4 times per day 02/09/15 1655 02/09/15 1808   02/09/15 1800  meropenem (MERREM) 1 g in sodium chloride 0.9 % 100 mL IVPB     1 g 200 mL/hr over 30 Minutes Intravenous  Once 02/09/15 1655 02/09/15 1818   02/08/15 1800  piperacillin-tazobactam (ZOSYN) IVPB 3.375 g  Status:  Discontinued     3.375 g 100 mL/hr over 30 Minutes Intravenous 4 times per day 02/08/15 1403 02/09/15 1655   02/07/15 1400  piperacillin-tazobactam (ZOSYN) IVPB 2.25 g  Status:  Discontinued     2.25 g 100 mL/hr  over 30 Minutes Intravenous 3 times per day 02/07/15 0750 02/08/15 1403   02/04/15 1200  piperacillin-tazobactam (ZOSYN) IVPB 2.25 g  Status:  Discontinued     2.25 g 100 mL/hr over 30 Minutes Intravenous 4 times per day 02/04/15 0859 02/04/15 0905   02/04/15 1200  piperacillin-tazobactam (ZOSYN) IVPB 3.375 g  Status:  Discontinued     3.375 g 12.5 mL/hr over 240 Minutes Intravenous Every 8 hours 02/04/15 0905 02/07/15 0750   02/02/15 2000  fluconazole (DIFLUCAN) tablet 100 mg     100 mg Oral Every 24 hours 02/02/15 1908 02/08/15 2041   02/02/15 1800  fluconazole (DIFLUCAN) tablet 100 mg  Status:  Discontinued     100 mg Oral Daily 02/02/15 1545 02/02/15 1901   01/31/15 1000  fluconazole (DIFLUCAN) tablet 100 mg  Status:  Discontinued     100 mg Oral Daily 01/31/15 0727 02/02/15 1313   01/29/15 1700  piperacillin-tazobactam (ZOSYN) IVPB 2.25 g  Status:  Discontinued     2.25 g 100 mL/hr over 30 Minutes Intravenous Every 8 hours 01/29/15 0845 02/04/15 0859   01/24/15 2200  vancomycin (VANCOCIN) IVPB 1000 mg/200 mL premix     1,000 mg 200 mL/hr over 60 Minutes Intravenous  Once 01/24/15 1349 01/24/15 2301   01/21/15 2000  vancomycin (VANCOCIN) 1,500 mg in sodium chloride 0.9 % 500 mL IVPB  Status:  Discontinued     1,500 mg 250 mL/hr over 120 Minutes Intravenous Every 24 hours 01/21/15 1048 01/23/15 1726   01/20/15 2000  vancomycin (VANCOCIN) 1,250 mg in sodium chloride 0.9 % 250 mL IVPB  Status:  Discontinued     1,250 mg 166.7 mL/hr over 90 Minutes Intravenous Every 24 hours 01/19/15 1945 01/20/15 0843   01/20/15 2000  vancomycin (VANCOCIN) 1,250 mg in sodium chloride 0.9 % 250 mL IVPB  Status:  Discontinued     1,250 mg 166.7 mL/hr over 90 Minutes Intravenous Every 24 hours 01/20/15 1519 01/21/15 1048   01/20/15 0000  piperacillin-tazobactam (ZOSYN) IVPB 3.375 g  Status:  Discontinued     3.375 g 12.5 mL/hr over 240 Minutes Intravenous Every 8 hours 01/19/15 1946 01/29/15 0845    01/19/15 1715  vancomycin (VANCOCIN) 2,500 mg in sodium chloride 0.9 % 500 mL IVPB     2,500 mg 250 mL/hr over 120 Minutes Intravenous  Once 01/19/15 1707 01/19/15 2159   01/19/15 1715  piperacillin-tazobactam (ZOSYN) IVPB 3.375 g     3.375 g 100 mL/hr over 30 Minutes Intravenous  Once 01/19/15 1707 01/19/15  1900      Assessment/Plan: Doing better.  Continue to encourage PO Wean TPN Not a candidate for xarelto given risk of bleeding Cont percutaneous drain Ok to discharge once tpn off F/u with me 2 weeks in office.   [name]. [name], MD, FACS General, Bariatric, & Minimally Invasive Surgery [facility], [place]   LOS: 37 days    [name] 02/25/2015

## 2015-02-25 NOTE — Progress Notes (Signed)
Spoke with wife via phone. She was at pt's bedside. Reports he ate 1/2 cheeseburger. Advised her his appetite is going to take some time to come back and that he would need to eat several smaller meals throughout day as opposed to 3 big meals. If doesn't feel like eating something solid, then a meal replacement shake. Advised her I thought he was stable for tx for SNF. She voiced understanding.   [name]. [name], MD, FACS General, Bariatric, & Minimally Invasive Surgery [facility], [place]

## 2015-02-25 NOTE — Progress Notes (Addendum)
Nutrition Follow-up  DOCUMENTATION CODES:   Severe malnutrition in context of acute illness/injury, Morbid obesity  INTERVENTION:  - Continue TPN wean - Continue Regular diet - Will d/c Nepro and order Ensure Enlive BID, each supplement provides 350 kcal and 20 grams of protein - RD will continue to monitor for needs  NUTRITION DIAGNOSIS:   Inadequate oral intake related to inability to eat as evidenced by NPO status. -resolving with diet order and improving appetite  GOAL:   Patient will meet greater than or equal to 90% of their needs -met on average  MONITOR:   PO intake, Supplement acceptance, Weight trends, Labs, I & O's, Other (Comment) (TPN regimen)  ASSESSMENT:   69 y/o admitted in 10/05/2012 with Sepsis, hypotension, acute renal failure and acute cholecystitis. He had been place on Eliquis for his AF by the [place]. He had an MI 2 years ago and did not follow up with his cardiologist. He was acutely ill and underwent a percutaneous drain placement on 10/06/14 by IR. He went home on 10/11/14. He was readmitted on 12/30/14 and underwent laparoscopic cholecystectomy with IOC. He was left with a drain in place and the site had "SNOW' placed in the GB fossa for hemostasis after the procedure. Drain was removed last week but there was some brusing at the site. He is transferred from [facility] with complaints of increased abdominal pain.  Pt was advanced from Soft to Regular diet this AM. He reports that he ate eggs for breakfast and that his wife is bringing him a Happy Meal from [employer] for lunch. Pt denies abdominal pain or nausea. He states that he has a fair appetite which is slowly improving.  Pt reports taste alteration with all intakes. He has told MD about this and MD has informed him that it is likely due to medications and that taste sensation should return to normal with d/c of medications. He has encouraged pt to eat/drink familiar foods and drinks that he enjoyed  PTA.  Pt currently receiving Clinimix E 5/15 @ 40 mL/hr with 20% lipids @ 5 mL/hr which is providing 48 grams protein, 922 kcal.   Per pharmacy note today: At 1800 today:  Continue a reduced rate Clinimix E 5/15 of 40 ml/hr. Will continue TPN until oral intake improves. Ideally, would continue TPN until oral intake meets at least 60% of nutritional needs. Did not meet that with calorie count yesterday. May be able to discontinue TPN tomorrow morning with 2 hour taper if intake improves further today (patient states that he is going to try to eat a 'happy meal' for lunch and tried eggs at breakfast).  Continue regular insulin in TPN: 15 units of regular insulin in 1L over 24 hour infusion.  20% lipid emulsion at 5 ml/hr. Held lipids for first week of ICU admission, resumed 8/30.   TPN to contain standard multivitamins and trace elements.   CBG monitoring, resistant SSI q4h  Reviewed meal slips in calorie count folder on pt's door. Dinner from yesterday not filled in with percentages and breakfast this AM shows 30% completion of eggs and 120 mL milk consumed.  Pt not meeting PO needs but need to keep in mind if pt is receiving food from restaurants/outside of the hospital. Will switch Nepro to Ensure as most pts tolerate/accept Ensure more than Nepro due to consistency.  Medications reviewed. Labs reviewed; CBGS: 88-276 mg/dL, Cl: 112 mmol/L.  ADDENDUM: Did repeat physical exam. Mild to moderate muscle wasting to clavicle noted and  mild fat wasting to upper arm area.   Diet Order:  TPN (CLINIMIX-E) Adult Diet regular Room service appropriate?: Yes; Fluid consistency:: Thin TPN (CLINIMIX-E) Adult  Skin:  Wound (see comment) (abdominal incision)  Last BM:  9/8  Height:   Ht Readings from Last 1 Encounters:  02/25/15 $RemoveB'5\' 6"'[id number]$  (1.676 m)    Weight:   Wt Readings from Last 1 Encounters:  02/25/15 249 lb 9 oz (113.2 kg)    Ideal Body Weight:  64.54 kg (kg)  BMI:  Body mass  index is 40.3 kg/(m^2).  Estimated Nutritional Needs:   Kcal:  1950-2150  Protein:  95-105g  Fluid:  2L/day  EDUCATION NEEDS:   No education needs identified at this time     Jarome Matin, RD, LDN Inpatient Clinical Dietitian Pager # [phone] After hours/weekend pager # [phone]

## 2015-02-25 NOTE — Progress Notes (Signed)
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 Was discharged home w/drain. Drain removed about a week before presentation to ER. Admitted directly from [facility] on 8/3 w/ septic shock which was likely due to infected hematoma in the GB fossa.  new perc drain placed on 8/4, when he remained hypotensive. Required HD  but now off with improving kidney function. On ventilator 8/26-8/29. Now upstirs on Tele floor.      PT Comments    Pt transferred from ICU to Tele.  Reported he was already sitting up in recliner this morning and now back to bed.  Very pleasant and motivated.  Assisted OOB to amb a greater distance in hallway with x 3 sitting rest breaks to equal a total gait distance of 195 feet.  Monitoring HR at rest 109 and with activity highest 137.  Still using B platform EVA walker to increase endurance, increase amb distance and increase upright time.  Progressing well.    Follow Up Recommendations  SNF     Equipment Recommendations       Recommendations for Other Services       Precautions / Restrictions Precautions Precautions: Fall Precaution Comments: JP drain on R, monitor HR, multiple lines  Very declonditioned due to length of stay.  Restrictions Weight Bearing Restrictions: No    Mobility  Bed Mobility Overal bed mobility: Needs Assistance Bed Mobility: Supine to Sit;Sit to Supine     Supine to sit: Mod assist;Min assist Sit to supine: Mod assist;Max assist   General bed mobility comments: use of rail and increased time  Transfers Overall transfer level: Needs assistance Equipment used: None Transfers: Sit to/from [employer] Transfers Sit to Stand: Min assist;+2 safety/equipment Stand pivot transfers: Min assist;+2 safety/equipment       General transfer  comment: pt demonstartes increased ability to self perform  Ambulation/Gait Ambulation/Gait assistance: Min assist;+2 safety/equipment Ambulation Distance (Feet): 195 Feet Assistive device: Bilateral platform walker (EVA walker) Gait Pattern/deviations: Step-through pattern;Decreased stride length;Trunk flexed Gait velocity: decreased   General Gait Details: Very weak.  Used B platform EVA walker for increased support and to increase activity tolerance/gait distance. tolerated increased distance.  very weak due to medical and extended length of stay.  did tolerate increased distance.   Stairs            Wheelchair Mobility    Modified Rankin (Stroke Patients Only)       Balance                                    Cognition Arousal/Alertness: Awake/alert Behavior During Therapy: WFL for tasks assessed/performed Overall Cognitive Status: Within Functional Limits for tasks assessed                 General Comments: much better spirits    Exercises      General Comments        Pertinent Vitals/Pain Pain Assessment: 0-10    Home Living                      Prior Function            PT Goals (current goals can now be found in the care plan section) Progress  towards PT goals: Progressing toward goals    Frequency  Min 3X/week    PT Plan Current plan remains appropriate    Co-evaluation             End of Session Equipment Utilized During Treatment: Gait belt Activity Tolerance: Patient tolerated treatment well Patient left: in bed;with call bell/phone within reach;with bed alarm set     Time: 1110-1200 PT Time Calculation (min) (ACUTE ONLY): 50 min  Charges:  $Gait Training: 23-37 mins $Therapeutic Activity: 8-22 mins                    G Codes:      Felecia Shelling  PTA [facility]  Acute  Rehab Pager      [phone]

## 2015-02-26 DIAGNOSIS — A419 Sepsis, unspecified organism: Secondary | ICD-10-CM | POA: Diagnosis not present

## 2015-02-26 LAB — CBC
HCT: 27.9 % — ABNORMAL LOW (ref 39.0–52.0)
Hemoglobin: 9.1 g/dL — ABNORMAL LOW (ref 13.0–17.0)
MCH: 30.8 pg (ref 26.0–34.0)
MCHC: 32.6 g/dL (ref 30.0–36.0)
MCV: 94.6 fL (ref 78.0–100.0)
PLATELETS: 391 10*3/uL (ref 150–400)
RBC: 2.95 MIL/uL — AB (ref 4.22–5.81)
RDW: 21.5 % — ABNORMAL HIGH (ref 11.5–15.5)
WBC: 4.1 10*3/uL (ref 4.0–10.5)

## 2015-02-26 LAB — PHOSPHORUS: Phosphorus: 3.7 mg/dL (ref 2.5–4.6)

## 2015-02-26 LAB — GLUCOSE, CAPILLARY
GLUCOSE-CAPILLARY: 164 mg/dL — AB (ref 65–99)
Glucose-Capillary: 156 mg/dL — ABNORMAL HIGH (ref 65–99)

## 2015-02-26 LAB — BASIC METABOLIC PANEL
Anion gap: 5 (ref 5–15)
BUN: 70 mg/dL — AB (ref 6–20)
CO2: 21 mmol/L — ABNORMAL LOW (ref 22–32)
CREATININE: 2.01 mg/dL — AB (ref 0.61–1.24)
Calcium: 8 mg/dL — ABNORMAL LOW (ref 8.9–10.3)
Chloride: 117 mmol/L — ABNORMAL HIGH (ref 101–111)
GFR calc Af Amer: 38 mL/min — ABNORMAL LOW (ref >60)
GFR, EST NON AFRICAN AMERICAN: 32 mL/min — AB (ref >60)
Glucose, Bld: 178 mg/dL — ABNORMAL HIGH (ref 65–99)
POTASSIUM: 4.6 mmol/L (ref 3.5–5.1)
SODIUM: 143 mmol/L (ref 135–145)

## 2015-02-26 LAB — MAGNESIUM: MAGNESIUM: 1.8 mg/dL (ref 1.7–2.4)

## 2015-02-26 MED ORDER — LEVALBUTEROL HCL 0.63 MG/3ML IN NEBU: 0.6300 mg | INHALATION_SOLUTION | Freq: Four times a day (QID) | RESPIRATORY_TRACT | Status: DC | PRN

## 2015-02-26 MED ORDER — INSULIN ASPART 100 UNIT/ML ~~LOC~~ SOLN: 0.0000 [IU] | Freq: Three times a day (TID) | SUBCUTANEOUS | Status: DC

## 2015-02-26 MED ORDER — DEXTROSE 5 % IV SOLN: 2.0000 g | INTRAVENOUS | Status: DC

## 2015-02-26 MED ORDER — ACETAMINOPHEN 325 MG PO TABS: 650.0000 mg | ORAL_TABLET | Freq: Four times a day (QID) | ORAL | Status: DC | PRN

## 2015-02-26 MED ORDER — HEPARIN SOD (PORK) LOCK FLUSH 100 UNIT/ML IV SOLN
250.0000 [IU] | INTRAVENOUS | Status: AC | PRN
  Administered 2015-02-26: 250 [IU]

## 2015-02-26 MED ORDER — CETYLPYRIDINIUM CHLORIDE 0.05 % MT LIQD: 7.0000 mL | Freq: Two times a day (BID) | OROMUCOSAL | Status: DC

## 2015-02-26 MED ORDER — INSULIN GLARGINE 100 UNIT/ML ~~LOC~~ SOLN: 15.0000 [IU] | Freq: Every day | SUBCUTANEOUS | Status: DC

## 2015-02-26 MED ORDER — ENSURE ENLIVE PO LIQD: 237.0000 mL | Freq: Two times a day (BID) | ORAL | Status: DC

## 2015-02-26 MED ORDER — CHLORHEXIDINE GLUCONATE 0.12 % MT SOLN: 15.0000 mL | Freq: Two times a day (BID) | OROMUCOSAL | Status: DC

## 2015-02-26 MED ORDER — METRONIDAZOLE 500 MG PO TABS: 500.0000 mg | ORAL_TABLET | Freq: Four times a day (QID) | ORAL | Status: AC

## 2015-02-26 NOTE — Progress Notes (Signed)
Subjective: No complaints. No n/v. +bms.   Objective: Vital signs in last 24 hours: Temp:  [97.5 F (36.4 C)-98.6 F (37 C)] 97.5 F (36.4 C) (09/10 0645) Pulse Rate:  [95-114] 95 (09/10 0645) Resp:  [18-20] 20 (09/10 0645) BP: (112-137)/(59-76) 112/59 mmHg (09/10 0645) SpO2:  [97 %-100 %] 98 % (09/10 0843) Last BM Date: 02/26/15  Intake/Output from previous day: 09/09 0701 - 09/10 0700 In: 2101.7 [P.O.:720; I.V.:248.3; IV Piggyback:350; TPN:783.3] Out: 450 [Urine:400; Drains:50] Intake/Output this shift: Total I/O In: 150 [P.O.:150] Out: 240 [Urine:200; Drains:40]  Alert, sitting in chair Obese, soft, nd. Drain - old blood; about 20-30cc/day  Lab Results:   Recent Labs  02/25/15 0840 02/26/15 0455  WBC 5.5 4.1  HGB 9.8* 9.1*  HCT 30.3* 27.9*  PLT 401* 391   BMET  Recent Labs  02/25/15 0840 02/26/15 0455  NA 143 143  K 3.6 4.6  CL 116* 117*  CO2 20* 21*  GLUCOSE 101* 178*  BUN 77* 70*  CREATININE 2.11* 2.01*  CALCIUM 8.2* 8.0*   PT/INR No results for input(s): LABPROT, INR in the last 72 hours. ABG No results for input(s): PHART, HCO3 in the last 72 hours.  Invalid input(s): PCO2, PO2  Studies/Results: No results found.  Anti-infectives: Anti-infectives    Start     Dose/Rate Route Frequency Ordered Stop   02/23/15 1200  cefTRIAXone (ROCEPHIN) 2 g in dextrose 5 % 50 mL IVPB     2 g 100 mL/hr over 30 Minutes Intravenous Every 24 hours 02/23/15 0956     02/23/15 1200  metroNIDAZOLE (FLAGYL) tablet 500 mg     500 mg Oral 4 times per day 02/23/15 0956     02/17/15 1400  vancomycin (VANCOCIN) IVPB 1000 mg/200 mL premix     1,000 mg 200 mL/hr over 60 Minutes Intravenous  Once 02/17/15 1320 02/17/15 1502   02/16/15 2200  piperacillin-tazobactam (ZOSYN) IVPB 3.375 g  Status:  Discontinued     3.375 g 12.5 mL/hr over 240 Minutes Intravenous 3 times per day 02/16/15 1603 02/23/15 0956   02/15/15 2200  meropenem (MERREM) 500 mg in sodium chloride  0.9 % 50 mL IVPB  Status:  Discontinued     500 mg 100 mL/hr over 30 Minutes Intravenous Every 12 hours 02/15/15 1415 02/16/15 1533   02/14/15 1200  vancomycin (VANCOCIN) IVPB 1000 mg/200 mL premix  Status:  Discontinued     1,000 mg 200 mL/hr over 60 Minutes Intravenous Every 24 hours 02/13/15 0920 02/16/15 0706   02/13/15 1000  vancomycin (VANCOCIN) 1,750 mg in sodium chloride 0.9 % 500 mL IVPB     1,750 mg 250 mL/hr over 120 Minutes Intravenous  Once 02/13/15 0836 02/13/15 1210   02/12/15 1630  meropenem (MERREM) 500 mg in sodium chloride 0.9 % 50 mL IVPB  Status:  Discontinued     500 mg 100 mL/hr over 30 Minutes Intravenous Every 6 hours 02/12/15 1106 02/15/15 1415   02/11/15 1800  anidulafungin (ERAXIS) 100 mg in sodium chloride 0.9 % 100 mL IVPB  Status:  Discontinued     100 mg over 90 Minutes Intravenous Every 24 hours 02/10/15 1744 02/23/15 1645   02/10/15 1730  anidulafungin (ERAXIS) 200 mg in sodium chloride 0.9 % 200 mL IVPB     200 mg over 180 Minutes Intravenous  Once 02/10/15 1720 02/10/15 2230   02/10/15 0200  meropenem (MERREM) 500 mg in sodium chloride 0.9 % 50 mL IVPB  Status:  Discontinued  500 mg 100 mL/hr over 30 Minutes Intravenous Every 8 hours 02/09/15 1808 02/12/15 1106   02/10/15 0000  meropenem (MERREM) 500 mg in sodium chloride 0.9 % 50 mL IVPB  Status:  Discontinued     500 mg 100 mL/hr over 30 Minutes Intravenous 4 times per day 02/09/15 1655 02/09/15 1808   02/09/15 1800  meropenem (MERREM) 1 g in sodium chloride 0.9 % 100 mL IVPB     1 g 200 mL/hr over 30 Minutes Intravenous  Once 02/09/15 1655 02/09/15 1818   02/08/15 1800  piperacillin-tazobactam (ZOSYN) IVPB 3.375 g  Status:  Discontinued     3.375 g 100 mL/hr over 30 Minutes Intravenous 4 times per day 02/08/15 1403 02/09/15 1655   02/07/15 1400  piperacillin-tazobactam (ZOSYN) IVPB 2.25 g  Status:  Discontinued     2.25 g 100 mL/hr over 30 Minutes Intravenous 3 times per day 02/07/15 0750  02/08/15 1403   02/04/15 1200  piperacillin-tazobactam (ZOSYN) IVPB 2.25 g  Status:  Discontinued     2.25 g 100 mL/hr over 30 Minutes Intravenous 4 times per day 02/04/15 0859 02/04/15 0905   02/04/15 1200  piperacillin-tazobactam (ZOSYN) IVPB 3.375 g  Status:  Discontinued     3.375 g 12.5 mL/hr over 240 Minutes Intravenous Every 8 hours 02/04/15 0905 02/07/15 0750   02/02/15 2000  fluconazole (DIFLUCAN) tablet 100 mg     100 mg Oral Every 24 hours 02/02/15 1908 02/08/15 2041   02/02/15 1800  fluconazole (DIFLUCAN) tablet 100 mg  Status:  Discontinued     100 mg Oral Daily 02/02/15 1545 02/02/15 1901   01/31/15 1000  fluconazole (DIFLUCAN) tablet 100 mg  Status:  Discontinued     100 mg Oral Daily 01/31/15 0727 02/02/15 1313   01/29/15 1700  piperacillin-tazobactam (ZOSYN) IVPB 2.25 g  Status:  Discontinued     2.25 g 100 mL/hr over 30 Minutes Intravenous Every 8 hours 01/29/15 0845 02/04/15 0859   01/24/15 2200  vancomycin (VANCOCIN) IVPB 1000 mg/200 mL premix     1,000 mg 200 mL/hr over 60 Minutes Intravenous  Once 01/24/15 1349 01/24/15 2301   01/21/15 2000  vancomycin (VANCOCIN) 1,500 mg in sodium chloride 0.9 % 500 mL IVPB  Status:  Discontinued     1,500 mg 250 mL/hr over 120 Minutes Intravenous Every 24 hours 01/21/15 1048 01/23/15 1726   01/20/15 2000  vancomycin (VANCOCIN) 1,250 mg in sodium chloride 0.9 % 250 mL IVPB  Status:  Discontinued     1,250 mg 166.7 mL/hr over 90 Minutes Intravenous Every 24 hours 01/19/15 1945 01/20/15 0843   01/20/15 2000  vancomycin (VANCOCIN) 1,250 mg in sodium chloride 0.9 % 250 mL IVPB  Status:  Discontinued     1,250 mg 166.7 mL/hr over 90 Minutes Intravenous Every 24 hours 01/20/15 1519 01/21/15 1048   01/20/15 0000  piperacillin-tazobactam (ZOSYN) IVPB 3.375 g  Status:  Discontinued     3.375 g 12.5 mL/hr over 240 Minutes Intravenous Every 8 hours 01/19/15 1946 01/29/15 0845   01/19/15 1715  vancomycin (VANCOCIN) 2,500 mg in sodium  chloride 0.9 % 500 mL IVPB     2,500 mg 250 mL/hr over 120 Minutes Intravenous  Once 01/19/15 1707 01/19/15 2159   01/19/15 1715  piperacillin-tazobactam (ZOSYN) IVPB 3.375 g     3.375 g 100 mL/hr over 30 Minutes Intravenous  Once 01/19/15 1707 01/19/15 1900      Assessment/Plan: Doing better.  Continue to encourage PO TPN off Not a  candidate for xarelto given risk of bleeding Cont percutaneous drain Ok to discharge from surgical standpoint F/u with Dr [name] in 2 weeks in office.   Vanita Panda, MD  Colorectal and General Surgery [facility]    LOS: 38 days    [place], [place] C. 02/26/2015

## 2015-02-26 NOTE — Progress Notes (Signed)
PARENTERAL NUTRITION CONSULT NOTE   Pharmacy Consult for TPN Indication: Prolonged ileus  No Known Allergies  Patient Measurements: Height: _0  (167.6 cm) Weight: 249 lb 9 oz (113.2 kg) IBW/kg (Calculated) : 63.8 Adjusted Body Weight: 83.2 kg (9/6)  Vital Signs: Temp: 97.5 F (36.4 C) (09/10 0645) Temp Source: Oral (09/10 0645) BP: 112/59 mmHg (09/10 0645) Pulse Rate: 95 (09/10 0645) Intake/Output from previous day: 09/09 0701 - 09/10 0700 In: 2101.7 [P.O.:720; I.V.:248.3; IV Piggyback:350; TPN:783.3] Out: 450 [Urine:400; Drains:50]  Labs:  Recent Labs  02/24/15 0455 02/25/15 0840 02/26/15 0455  WBC 9.8 5.5 4.1  HGB 9.1* 9.8* 9.1*  HCT 27.3* 30.3* 27.9*  PLT 377 401* 391     Recent Labs  02/24/15 0455 02/25/15 0840 02/26/15 0455  NA 141 143 143  K 3.7 3.6 4.6  CL 114* 116* 117*  CO2 20* 20* 21*  GLUCOSE 138* 101* 178*  BUN 94* 77* 70*  CREATININE 2.42* 2.11* 2.01*  CALCIUM 8.1* 8.2* 8.0*  MG 1.7 1.6* 1.8  PHOS 3.0 3.1 3.7  PROT 6.1*  --   --   ALBUMIN 2.0*  --   --   AST 27  --   --   ALT 25  --   --   ALKPHOS 114  --   --   BILITOT 0.9  --   --    Estimated Creatinine Clearance: 41.6 mL/min (by C-G formula based on Cr of 2.01).    Recent Labs  02/25/15 1610 02/25/15 2127 02/26/15 0735  GLUCAP 137* 173* 164*   Insulin Requirements: 3 units of Novolog SSI + Lantus 20 units + 15 units regular insulin provided by TPN  Current Nutrition: Advanced to soft diet on 9/7 (no N/V with CLD, FLD) 9/8 48 hour calorie count in progress  IVF: NS KVO  Central access: PICC  TPN start date:  8/22  ASSESSMENT                                                                                                          HPI: 61 YOM presents with worsening abdominal pain on 8/3.  Recent cholecystectomy on 01/04/2015 with drain removed 2 weeks prior to admission. Found to have GB fossa fluid collection (infected hematoma) and abscess. IR placed GB fossa drain.   Pharmacy asked to start TPN 8/22 for prolonged ileus and interment vomiting prohibiting use of enteral nutrition.    Significant events:  8/3 septic shock from infected GB fossa 8/22 orders to start TPN 8/23 Start CRRT 8/30 CRRT stopped 9/2: Reglan restarted.  Having multiple BMs.  9/3-9/4: NG output still high.  Holding on clamping trials for now.  9/6: tolerated NG clamp, NG tube d/c.  Started CLD. 9/7 Begin weaning TPN, resume electrolytes in TPN.  Today, 02/26/2015:    Glucose - CBGs above goal (<150 mg/dL) and required significantly less SSI coverage in the past 24 hours.  Hydrocortisone -  tapering  Electrolytes:  Na WNL   K WNL  Mg WNL  Phos remains WNL  CorrCa WNL.  Renal - recovering. S/p CRRT 8/23-8/30 for nonoliguric AKI.  UOP 1.1 mL/kg/hr  24h I/O = -1284 mL  SCr improving slowly, BUN decreasing  LFTs - AST/ALT, Alk Phos, Tbili WNL (9/8)  TGs - 155 (8/23), 175 (8/29), 183 (9/5)  Prealbumin - 5.3 (8/22), 10 (8/29), 17.5 (9/5)  NUTRITIONAL GOALS                                                                                             Updated RD recs 9/9: 1950-2150 kcals, protein 95-105 g  Clinimix 5/15 at a goal rate of 29m/hr + 20% fat emulsion at 118mhr to provide: 100 g/day protein, 1894 Kcal/day.   PLAN                                                                                                               today:  Reduce TPN rate to 2032mr for at least 2h then turn off (plan is discharge today)  Discuss with Dr. TsuGeorgette Dover/C TPN labs  DusDoreene ElandharmD, BCPS.   Pager: [phone] 10:23 AM

## 2015-02-26 NOTE — Clinical Social Work Placement (Signed)
   CLINICAL SOCIAL WORK PLACEMENT  NOTE  Date:  02/26/2015  Patient Details  Name: Larry [name] MRN: [id number] Date of Birth: [birth year]  Clinical Social Work is seeking post-discharge placement for this patient at the Skilled  Nursing Facility level of care (*CSW will initial, date and re-position this form in  chart as items are completed):  Yes   Patient/family provided with [facility] Clinical Social Work Department's list of facilities offering this level of care within the geographic area requested by the patient (or if unable, by the patient's family).  Yes   Patient/family informed of their freedom to choose among providers that offer the needed level of care, that participate in Medicare, Medicaid or managed care program needed by the patient, have an available bed and are willing to accept the patient.  Yes   Patient/family informed of [facility]'s ownership interest in [facility] and [facility], as well as of the fact that they are under no obligation to receive care at these facilities.  PASRR submitted to EDS on 02/25/15     PASRR number received on 02/25/15     Existing PASRR number confirmed on       FL2 transmitted to all facilities in geographic area requested by pt/family on 02/25/15     FL2 transmitted to all facilities within larger geographic area on       Patient informed that his/her managed care company has contracts with or will negotiate with certain facilities, including the following:        Yes   Patient/family informed of bed offers received.  Patient chooses bed at [facility]     Physician recommends and patient chooses bed at      Patient to be transferred to [facility] on  .February 26, 2015  Patient to be transferred to facility by     ambulance  Patient family notified on  February 26, 2015 of transfer.  Name of family member notified:    crystal/wife    PHYSICIAN       Additional Comment:     _______________________________________________ Annetta Maw, LCSW 02/26/2015, 2:42 PM

## 2015-02-26 NOTE — Progress Notes (Signed)
Pt transferred to [facility] at this time via [facility]. Crew in possession of transfer packet and all pt belongings. Pt stable at time of d/c.  

## 2015-02-26 NOTE — Progress Notes (Signed)
Report called to [place], Charity fundraiser at [employer]. All questions answered. Contact number provided. Pt aware of transfer and agreeable.

## 2015-02-26 NOTE — Discharge Summary (Signed)
Physician Discharge Summary  Larry [name] ZOX:[id number] DOB: [birth year] DOA: 01/19/2015  PCP: [name]  Admit date: 01/19/2015 Discharge date: 02/26/2015  Time spent: Greater than 30 minutes  Recommendations for Outpatient Follow-up:  1. M.D. at SNF in 3 days. 2. Follow CBC and BMP 2 times weekly-next lab draw on 02/28/15. 3. Dr. [name], [facility] surgery, in 2 weeks: SNF to coordinate appointment 4. Dr. Cliffton Asters, infectious disease: SNF to coordinate appointment 5. Nephrology with [facility]: SNF to coordinate appointment 6. Dr. Armanda Magic, Cardiology, SNF to arrange follow up appointment.  Discharge Diagnoses:  Principal Problem:   Intra-abdominal abscess Active Problems:   Septic shock   Anticoagulated   Paroxysmal a-fib   Acute blood loss anemia   Catheter-associated urinary tract infection   Intra-abdominal infection   Candidiasis of urogenital site   Acute respiratory failure with hypoxia   Infected hematoma GB fossa of liver   Leukocytosis   Diabetes mellitus with renal complications   Anemia of chronic disease   Hypokalemia   Hypomagnesemia   Acute renal failure   Protein-calorie malnutrition, severe   AKI (acute kidney injury)   Encounter for palliative care   Acute encephalopathy   Discharge Condition: Improved & Stable  Diet recommendation: Heart healthy and diabetic diet.  Filed Weights   02/22/15 0322 02/23/15 0430 02/25/15 0026  Weight: 112.4 kg (247 lb 12.8 oz) 112.5 kg (248 lb 0.3 oz) 113.2 kg (249 lb 9 oz)    History of present illness:  69 year old male with PMH of A. fib on eliquis, HTN, HLD, 2, initially hospitalized 10/05/14 with sepsis due to acute cholecystitis, PC drain by IR on 10/06/14, DC'ed home on 10/11/2014. He was readmitted 12/30/2014 and underwent Lap cholecystectomy with IOC on 12/30/14. He presented to [facility] on 01/19/15 with abdominal pain and was transferred and admitted to [facility] by general surgery for septic shock related to infected hematoma.   Hospital Course:   Acute hypoxic respiratory failure -Secondary to significant agitation, renal failure/possible uremia and delirium (resolved) - Patient has chronically elevated right hemidiaphragm. - Patient was on ventilatory support 8/26 > 8/29. Extubated and stable. - Resolved  Septic shock - Off vasodepressors for several days and stable. Resolved. - Primary source of sepsis: Infected hematoma from recent cholecystectomy and infected ascitic fluid.  Paroxysmal A. Fib - ChadVasc score: 4 - Currently in sinus rhythm. Discussed with Dr. [name], CCS on 02/25/15: Will be at high risk for intra-abdominal bleeding and hence risks outweigh benefits of starting anticoagulation or aspirin at this time. This has to be revisited in the future. - Outpatient follow-up with cardiology  Adrenal insufficiency - Weaned steroids to off   Acute kidney injury - In the setting of septic shock, prior hypotension/ATN - Status post CRRT. HD catheter out. DC Foley catheter 9/7. - Creatinine continues to gradually improve. Monitor BMP closely at SNF. Outpatient follow-up with nephrology.  Hypokalemia/hypomagnesemia - Replace and follow as needed.  Infected hematoma status post cholecystectomy/peritonitis - Status post CT-guided percutaneous drain with subsequent upsized to 16 [place] - Paracentesis 8/31 consistent with peritonitis - TNA tapered and discontinued early this morning. Patient tolerating diet and appetite is improving. - ID following. Ascitic fluid cultures show Klebsiella-antifungals discontinued. As per ID follow-up, continue at least 4 more weeks off therapy for his complex intra-abdominal infection with current antibiotic regimen (IV Rocephin and oral Flagyl)-end date 03/24/15. ID will arrange outpatient follow-up once he is transferred to  SNF. He will need follow-up abdominal CT scan in about 4 weeks - Continue  percutaneous drain and outpatient follow-up with Gen. surgery in 2 weeks  GERD - PPI  Protein calorie malnutrition - Titrated down and discontinue TNA this morning. Appetite is gradually improving.  Acute blood loss anemia in the setting of intra-abdominal hemorrhage - Status post 6 units PRBC since admission. Stable. Follow CBCs periodically.  Diabetes mellitus  - continue Lantus and SSI. Too tightly controlled. Reduced Lantus and change SSI to sensitive. Better. May have to make adjustments to his insulins based on oral intake and CBGs.  Delirium  - Secondary to critical illness and ICU care. resolved.   Essential hypertension - Controlled off of medications. Monitor.  Ileus - Resolved  Functional quadriplegia - Secondary to acute illness and prolonged hospitalization. Work with PT. SNF at discharge.  CAD - Stable   SIGNIFICANT EVENTS: 8/03 Admitted w/ abd pain and hypotension  8/04 hypotensive over-night. PERC drain of GB fossa. Found what looked like old infected hematoma. PCCM asked to see post-op for shock 8/05 off pressors. WOB worse.  8/06 WOB & wheezing slightly worse. Progressing abdominal pain 8/07 Hgb declining w/ increasing intraperitoneal hemorrhage 8/07 Transfused 2u PRBCs 8/09 work of breathing still significant w/ marked upper airway component  8/10 breathing better. 1.6 liters negative. Slight bump in scr. Vanc stopped. Getting OOB for first time 8/11 eating regular diet. Up in chair. Feeling better. Respiratory status improved. PCCM s/o 8/14 Renal consulted for rising sr cr (to 3.60), baseline sr cr 1.1-1.5 (prior IV contrast 7/28).  8/14 Concern for yeast / bacterial UTI  8/15 ID consulted > abx narrowed to zosyn only, fluconazole for yeast  8/16 UOP increased on IV lasix 8/18 NPO, sr cr improving, CT abd as above  8/18 GB drain upsized per IR to 16 Fr 8/19 Abd pain, eating well. Diarrhea after ensure. To PO lasix.  Discussed gastrostomy tube with IR 8/22 Sr Cr rising, intermittent vomiting. CT abd as above. Increased confusion 8/23 HD cath inserted, CRRT initiated. Gastrostomy tube held off due to CRRT & Hgb drop 8/24 WBC elevated, cultures repeated.  8/24 Paracentesis >> 150 ml "bloody bile" fluid removed, loculated and difficult to aspirate 8/25 Palliative care consulted, PCCM consulted. Vomiting with NGT insertion.  8/26 Intubated early am(0200) for desaturations, significant agitation, delirium  8/30 CRRT stopped, bilious vomiting/secretions 8/31 Paracentesis >> 3.1 L 9/01 Transfused 1 U PRBC 9/04 vaso re added, improved neo off 9/06 SLP evaluation >> cleared for regular diet, thin liquids  Consultants:  CCM  Nephrology  General surgery  Infectious disease  Palliative care medicine  Interventional radiology  Procedures:  RUQ drain  RUE PICC line  Foley catheter-DC'd 9/7  HD catheter placement in left IJ on 8/23-DC'd  Paracentesis by IR on 8/24 & 8/31     Discharge Exam:  Complaints: Continues to feel better. As per patient and nursing, tolerated a happy meal/Burger and fries last night that his spouse got from outside. Weakness gradually improving. Soft BM. Denies dyspnea or abdominal pain.  Filed Vitals:   02/26/15 0231 02/26/15 0645 02/26/15 0843 02/26/15 1026  BP: 137/73 112/59  128/70  Pulse: 110 95  113  Temp: 98 F (36.7 C) 97.5 F (36.4 C)  97.6 F (36.4 C)  TempSrc: Oral Oral  Oral  Resp: 20 20  22   Height:      Weight:      SpO2: 99% 97% 98% 100%    General exam:  Moderately built and obese middle-aged male sitting up comfortably in chair Respiratory system: reduced breath sounds in the bases but otherwise clear to auscultation. No increased work of breathing. Cardiovascular system: S1 & S2 heard, RRR. No JVD, murmurs, gallops, clicks. Trace bilateral ankle edema. Gastrointestinal system: Abdomen is nondistended, soft and nontender.  Normal bowel sounds heard. RUQ drain +. Central nervous system: Alert and oriented. No focal neurological deficits. Extremities: Symmetric 5 x 5 power. RUE PICC line +   Discharge Instructions      Discharge Instructions    (HEART FAILURE PATIENTS) Call MD:  Anytime you have any of the following symptoms: 1) 3 pound weight gain in 24 hours or 5 pounds in 1 week 2) shortness of breath, with or without a dry hacking cough 3) swelling in the hands, feet or stomach 4) if you have to sleep on extra pillows at night in order to breathe.    Complete by:  As directed      Call MD for:  difficulty breathing, headache or visual disturbances    Complete by:  As directed      Call MD for:  extreme fatigue    Complete by:  As directed      Call MD for:  hives    Complete by:  As directed      Call MD for:  persistant dizziness or light-headedness    Complete by:  As directed      Call MD for:  persistant nausea and vomiting    Complete by:  As directed      Call MD for:  redness, tenderness, or signs of infection (pain, swelling, redness, odor or green/yellow discharge around incision site)    Complete by:  As directed      Call MD for:  severe uncontrolled pain    Complete by:  As directed      Call MD for:  temperature >100.4    Complete by:  As directed      Diet - low sodium heart healthy    Complete by:  As directed      Diet Carb Modified    Complete by:  As directed      Increase activity slowly    Complete by:  As directed             Medication List    STOP taking these medications        allopurinol 300 MG tablet  Commonly known as:  ZYLOPRIM     amLODipine 10 MG tablet  Commonly known as:  NORVASC     apixaban 5 MG Tabs tablet  Commonly known as:  ELIQUIS     aspirin 81 MG tablet     citalopram 40 MG tablet  Commonly known as:  CELEXA     furosemide 40 MG tablet  Commonly known as:  LASIX     Liraglutide 18 MG/3ML Sopn     lisinopril 40 MG tablet  Commonly  known as:  PRINIVIL,ZESTRIL     metFORMIN 500 MG tablet  Commonly known as:  GLUCOPHAGE     ondansetron 8 MG tablet  Commonly known as:  ZOFRAN     oxyCODONE-acetaminophen 5-325 MG per tablet  Commonly known as:  PERCOCET/ROXICET      TAKE these medications        acetaminophen 325 MG tablet  Commonly known as:  TYLENOL  Take 2 tablets (650 mg total) by mouth every 6 (six) hours as needed for  mild pain, moderate pain, fever or headache.     antiseptic oral rinse 0.05 % Liqd solution  Commonly known as:  CPC / CETYLPYRIDINIUM CHLORIDE 0.05%  7 mLs by Mouth Rinse route 2 times daily at 12 noon and 4 pm.     atorvastatin 80 MG tablet  Commonly known as:  LIPITOR  Take 80 mg by mouth at bedtime.     cefTRIAXone 2 g in dextrose 5 % 50 mL  Inject 2 g into the vein daily. For 4 weeks from 02/25/2015.     chlorhexidine 0.12 % solution  Commonly known as:  PERIDEX  15 mLs by Mouth Rinse route 2 (two) times daily.     feeding supplement (ENSURE ENLIVE) Liqd  Take 237 mLs by mouth 2 (two) times daily between meals.     glucose 4 GM chewable tablet  Chew 1 tablet by mouth as needed for low blood sugar (only if BS IS BELOW 70).     insulin aspart 100 UNIT/ML injection  Commonly known as:  novoLOG  Inject 0-9 Units into the skin 3 (three) times daily with meals. CBG < 70: implement hypoglycemia protocol CBG 70 - 120: 0 units CBG 121 - 150: 1 unit CBG 151 - 200: 2 units CBG 201 - 250: 3 units CBG 251 - 300: 5 units CBG 301 - 350: 7 units CBG 351 - 400: 9 units CBG > 400: call MD.     insulin glargine 100 UNIT/ML injection  Commonly known as:  LANTUS  Inject 0.15 mLs (15 Units total) into the skin daily.     levalbuterol 0.63 MG/3ML nebulizer solution  Commonly known as:  XOPENEX  Take 3 mLs (0.63 mg total) by nebulization every 6 (six) hours as needed for wheezing or shortness of breath.     magnesium oxide 400 MG tablet  Commonly known as:  MAG-OX  Take 400 mg by mouth 2 (two)  times daily.     metroNIDAZOLE 500 MG tablet  Commonly known as:  FLAGYL  Take 1 tablet (500 mg total) by mouth every 6 (six) hours. For 4 weeks starting 02/25/2015.     pantoprazole 40 MG tablet  Commonly known as:  PROTONIX  Take 40 mg by mouth daily.     vitamin B-12 500 MCG tablet  Commonly known as:  CYANOCOBALAMIN  Take 500 mcg by mouth daily.     VITAMIN D PO  Take 2,000 Units by mouth daily.       Follow-up Information    Follow up with [name], MD. Schedule an appointment as soon as possible for a visit in 2 weeks.   Specialty:  General Surgery   Why:  For wound re-check   Contact information:   [address] [place] [address] [phone]        The results of significant diagnostics from this hospitalization (including imaging, microbiology, ancillary and laboratory) are listed below for reference.    Significant Diagnostic Studies: Ct Abdomen Pelvis Wo Contrast  02/16/2015   CLINICAL DATA:  Six weeks post laparoscopic cholecystectomy complicated by a gallbladder fossa hematoma, followup, past surgical drain that was pulled, now having vomiting and abdominal pain, unable to keep oral intake down, past history hypertension, atrial fibrillation, coronary artery disease post MI  EXAM: CT ABDOMEN AND PELVIS WITHOUT CONTRAST  TECHNIQUE: Multidetector CT imaging of the abdomen and pelvis was performed following the standard protocol without IV contrast. Sagittal and coronal MPR images reconstructed from  axial data set. Patient drank a small amount of dilute oral contrast for exam.  COMPARISON:  02/07/2015  FINDINGS: Small RIGHT pleural effusion with bibasilar atelectasis.  Subcapsular fluid collection at lateral and posterior aspects of liver extending to dome, stable.  Pigtail drainage catheter identified within a persistent large fluid collection at the gallbladder fossa 11.7 x 9.0 x 8.5 cm previously 9.7 x 9.7 x 8.8 cm.  Additional subhepatic collection  anteromedial to gallbladder collection containing air and fluid, decreased in size now measuring 5.0 x 5.0 x 4.4 cm, uncertain if communicating with the gallbladder fossa collection, previously measured 6.6 x 6.8 x 6.8 cm.  Multiple low-attenuation fluid collections without gas are seen throughout the abdomen including large amount of fluid at the gutters into the pelvis, interloop, perigastric, and anteriorly in the upper and mid abdomen, probably partially loculated at multiple sites.  Nasogastric tube extends to the pylorus.  Several foci of extraluminal gas are identified anterior to the gallbladder fossa collection, potentially related to the abscess collection and catheter drainage, without free intraperitoneal air at additional sites.  Spleen, pancreas, kidneys and adrenal glands unremarkable.  Scattered atherosclerotic calcifications.  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  Bladder decompressed by urinary bladder.  No mass, adenopathy, hernia or acute bone lesion.  IMPRESSION: Persistent large gas and fluid collection at the gallbladder fossa slightly larger than on previous exam despite pigtail drainage catheter.  Interval mild decrease in size of a collection in anteromedial to the gallbladder fossa collection, question communicating with drainage catheter.  Significant ascites, question partially loculated at sites in the upper abdomen.  Several small foci of gas are seen anterior to the gallbladder fossa collection, question related to percutaneous drainage, without additional free intraperitoneal air identified.   Electronically Signed   By: Ulyses Southward M.D.   On: 02/16/2015 11:24   Ct Abdomen Pelvis Wo Contrast  02/07/2015   CLINICAL DATA:  Status post gallbladder fossa drain, check for resolution  EXAM: CT ABDOMEN AND PELVIS WITHOUT CONTRAST  TECHNIQUE: Multidetector CT imaging of the abdomen and pelvis was performed following the standard protocol without IV contrast.   COMPARISON:  02/03/2015  FINDINGS: The lung bases demonstrate bilateral pleural effusions right greater than left with associated right lower lobe consolidation. These changes are stable from the prior exam.  The liver is stable in appearance. A large perihepatic fluid collection is again identified and stable. A drain is noted within the gallbladder fossa collection although the collection does not appear to have significantly improved in the interval. It again measures approximately 9.5 x 8.9 cm and demonstrates air within.  The spleen, adrenal glands, pancreas and kidneys are stable in appearance from the prior study.  Free fluid is again noted within the abdomen and pelvis. The bladder is decompressed by Foley catheter. Diverticular change of the colon is seen. Diffuse aortoiliac calcifications are noted. Postoperative changes consistent with a right hip replacement are seen. Degenerative changes of the lumbar spine are noted.  IMPRESSION: Stable perihepatic hematoma when compared with the prior exam.  Stable gallbladder fossa collection despite adequate drainage catheter placement.  Significant free fluid within the abdomen and pelvis slightly increased from the prior exam.  The remainder of the study is stable.   Electronically Signed   By: Alcide Clever M.D.   On: 02/07/2015 11:50   Ct Abdomen Pelvis Wo Contrast  02/03/2015   CLINICAL DATA:  Leukocytosis, status post laparoscopic cholecystectomy on 07/18, complicated by infected hematoma  in gallbladder fossa  EXAM: CT ABDOMEN AND PELVIS WITHOUT CONTRAST  TECHNIQUE: Multidetector CT imaging of the abdomen and pelvis was performed following the standard protocol without IV contrast.  COMPARISON:  01/27/2015  FINDINGS: Lower chest: Moderate right pleural effusion. Associated right lower lobe opacity, likely compressive atelectasis.  Trace left pleural effusion with associated dependent atelectasis.  Hepatobiliary: 2.0 x 3.1 cm subcapsular fluid collection  along the posterior aspect of the left hepatic lobe (series 2/ image 26) previously 1.7 x 2.4 cm.  Subcapsular hematoma along the posterior liver. This measures 3.9 x 10.4 cm along the posterior right hepatic dome (series 2/image 19), previously 3.1 x 7.9 cm, and 8.7 x 12.6 cm inferior to the right hepatic lobe (series 2/ image 41), previously 7.4 x 12.0 cm.  9.8 x 9.2 cm hematoma with gas in the gallbladder fossa (series 2/image 30), previously 9.8 x 10.0 cm, with indwelling pigtail drainage catheter.  Pancreas: Within normal limits.  Spleen: Within normal limits.  Adrenals/Urinary Tract: Adrenal glands unremarkable.  Kidneys are grossly unremarkable.  No renal, ureteral, or bladder calculi.  No hydronephrosis.  Bladder is decompressed by indwelling Foley catheter.  Stomach/Bowel: Stomach is notable for a small hiatal hernia.  No evidence of bowel obstruction.  Wall thickening involving the right colon (series 2/image 38), possibly secondary to ascites and right upper quadrant inflammation.  Vascular/Lymphatic: Atherosclerotic calcifications of the abdominal aorta and branch vessels.  No suspicious abdominopelvic lymphadenopathy.  Reproductive: Prostate is grossly unremarkable.  Other: Moderate abdominopelvic ascites, stable versus mildly increased.  Musculoskeletal: Degenerative changes of the visualized thoracolumbar spine.  Right hip arthroplasty.  IMPRESSION: 9.8 cm hematoma with gas in the gallbladder fossa, with indwelling pigtail drainage catheter, mildly decreased.  12.6 cm hematoma along the posterior right liver, mildly increased.  Moderate abdominopelvic ascites, stable versus mildly increased.  Mild wall thickening involving the right colon, possibly secondary to ascites and right upper quadrant inflammation.   Electronically Signed   By: Charline Bills M.D.   On: 02/03/2015 11:22   Ct Abdomen Pelvis Wo Contrast  01/28/2015   CLINICAL DATA:  Evaluate gallbladder fossa abscess/hematoma.  EXAM: CT  ABDOMEN AND PELVIS WITHOUT CONTRAST  TECHNIQUE: Multidetector CT imaging of the abdomen and pelvis was performed following the standard protocol without IV contrast.  COMPARISON:  01/22/2015  FINDINGS: There are small bilateral pleural effusions, right side greater the left. Again noted is volume loss and consolidation throughout the right lower lobe which is similar to the prior examination. There is mild atelectasis at the left lung base.  Again noted is a heterogeneous collection in the gallbladder fossa containing high-density material and gas. This gallbladder fossa collection is difficult to measure on the non contrast examination but roughly measures 10.0 x 9.8 x 9.8 cm and previously measured 10.2 x 9.6 x 10.0 cm. There continues to be a percutaneous drainage catheter within this gallbladder collection and this is not changed in position.  Persistent mixed density collection along the posterior and inferior aspect of the liver. This is compatible with a hematoma. This collection measures 11.8 cm in transverse dimension and previously measured 11.4 cm. In addition, there appears to be high-density fluid collections along the anterior right hepatic lobe on sequence 2, image 29. These are most compatible with hematomas and the largest measures 3.8 x 5.3 cm, previously measured 4.1 x 4.9 cm. The perihepatic hematoma extends up to the right hemidiaphragm. There continues to be a moderate amount of ascites in the abdomen or pelvis  which has not significantly changed.  There is a small amount of heterogeneous fluid along the lateral aspect of the spleen similar to the previous examination. No gross abnormality to the pancreas or adrenal glands. Small stones or calcifications in the left kidney lower pole without hydronephrosis. No acute abnormality to the right kidney.  Suspect a small hiatal hernia. No significant dilatation of small or large bowel.  There is diffuse subcutaneous edema throughout the abdomen and  pelvis. Again noted is a right hip arthroplasty causing artifact in the pelvis. Multilevel degenerative changes in the thoracic and lumbar spine.  IMPRESSION: The gallbladder fossa hematoma/abscess has not significantly changed in size. Stable position of the percutaneous drainage catheter.  Stable appearance of the large perihepatic or subcapsular liver hematoma. Additional hematomas along the right anterior abdomen are stable. No significant change in the amount of ascites. Again noted is a small amount of perisplenic fluid.  Bilateral pleural effusions, right side greater the left. Stable volume loss and consolidation in the right lower lobe.   Electronically Signed   By: Richarda Overlie M.D.   On: 01/28/2015 07:23   Ir Catheter Tube Change  02/08/2015   CLINICAL DATA:  Cholecystectomy, postop hematoma, possibly infected. Poor output from drain catheter.  EXAM: ABSCESS  CATHETER EXCHANGE UNDER FLUOROSCOPY  FLUOROSCOPY TIME:  1.3 minutes, 71 mgy  TECHNIQUE: The procedure, risks (including but not limited to bleeding, infection, organ damage ), benefits, and alternatives were explained to the patient. Questions regarding the procedure were encouraged and answered. The patient understands and consents to the procedure. The abscess drain catheter and surrounding skin were prepped with Betadine, draped in usual sterile fashion. Catheter was injected, partially opacifying the periphery of the known residual sub hepatic collection.  The catheter was cut and exchanged over a 0.035" angiographic wire for a new 14-French pigtail catheter, formed centrally within the collection system under fluoroscopy. Contrast injection confirms appropriate positioning. Catheter secured externally with 0 Prolene suture and StatLock.  Catheter placed to gravity bag. The patient tolerated the procedure well.  COMPLICATIONS: COMPLICATIONS none  IMPRESSION:  1. Technically successful exchange and up sizing of a gallbladder fossa abscess drain  catheter under fluoroscopy   Electronically Signed   By: Corlis Leak M.D.   On: 02/08/2015 12:29   US Renal  02/06/2015   CLINICAL DATA:  Patient with acute renal failure. Prior cholecystectomy.  EXAM: RENAL / URINARY TRACT ULTRASOUND COMPLETE  COMPARISON:  CT abdomen pelvis 02/03/2015  FINDINGS: Right Kidney:  Length: 11.0 cm. Echogenicity within normal limits. No mass or hydronephrosis visualized.  Left Kidney:  Length: 12.0 cm. Echogenicity within normal limits. No mass or hydronephrosis visualized.  Bladder:  Foley catheter.  Splenomegaly measuring 17.6 cm.  Ascites.  IMPRESSION: No hydronephrosis.  Splenomegaly.  Ascites.   Electronically Signed   By: Annia Belt M.D.   On: 02/06/2015 13:29   US Paracentesis  02/16/2015   INDICATION: Patient with recent history of sepsis, gallbladder fossa abscess drainage, renal insufficiency, ascites. Request made for diagnostic and therapeutic paracentesis.  EXAM: ULTRASOUND-GUIDED DIAGNOSTIC AND THERAPEUTIC PARACENTESIS  COMPARISON:  Prior paracentesis on 02/09/2015  MEDICATIONS: None.  COMPLICATIONS: None immediate  TECHNIQUE: Informed written consent was obtained from the patient after a discussion of the risks, benefits and alternatives to treatment. A timeout was performed prior to the initiation of the procedure.  Initial ultrasound scanning demonstrates a moderate-to-large amount of ascites within the right lower abdominal quadrant. The right lower abdomen was prepped and draped  in the usual sterile fashion. 1% lidocaine was used for local anesthesia. Under direct ultrasound guidance, a 19 gauge, 10-cm, Yueh catheter was introduced. An ultrasound image was saved for documentation purposed. The paracentesis was performed. The catheter was removed and a dressing was applied. The patient tolerated the procedure well without immediate post procedural complication.  FINDINGS: A total of approximately 3.1 liters of dark, bloody fluid was removed. Samples were sent to  the laboratory as requested by the clinical team.  IMPRESSION: Successful ultrasound-guided diagnostic and therapeutic paracentesis yielding 3.1 liters of peritoneal fluid. Critical care medicine notified of the above findings. The patient was given IV albumin during the procedure.  Read by: Jeananne Rama, PA-C   Electronically Signed   By: Irish Lack M.D.   On: 02/16/2015 16:56   US Paracentesis  02/09/2015   INDICATION: ascites  EXAM: ULTRASOUND-GUIDED PARACENTESIS--Bedside/portable  COMPARISON:  None.  MEDICATIONS: 100 cc 1% lidocaine  COMPLICATIONS: None immediate  TECHNIQUE: Informed written consent was obtained from the patient after a discussion of the risks, benefits and alternatives to treatment. A timeout was performed prior to the initiation of the procedure.  Initial ultrasound scanning demonstrates a large amount of ascites within the right lower abdominal quadrant. The right lower abdomen was prepped and draped in the usual sterile fashion. 1% lidocaine with epinephrine was used for local anesthesia. Under direct ultrasound guidance, a 19 gauge, 7-cm, Yueh catheter was introduced. An ultrasound image was saved for documentation purposed. The paracentesis was performed. The catheter was removed and a dressing was applied. The patient tolerated the procedure well without immediate post procedural complication.  FINDINGS: A total of approximately 150 cc of bloody appearing fluid was removed. Samples were sent to the laboratory as requested by the clinical team.  IMPRESSION: Successful ultrasound-guided paracentesis yielding 150 cc of peritoneal fluid.  Read by:  Robet Leu [facility]   Electronically Signed   By: Simonne Come M.D.   On: 02/09/2015 15:13   Dg Chest Port 1 View  02/22/2015   CLINICAL DATA:  Cough.  Shortness of breath.  EXAM: PORTABLE CHEST - 1 VIEW  COMPARISON:  02/21/2015.  FINDINGS: NG tube and right PICC line in stable position. Cardiomegaly with normal pulmonary vascularity. Low  lung volumes with bibasilar atelectasis. No pleural effusion or pneumothorax. Degenerative changes both shoulders.  IMPRESSION: 1. Lines and tubes in stable position. 2. Low lung volumes with bibasilar atelectasis. 3. Stable cardiomegaly.   Electronically Signed   By: Maisie Fus  Register   On: 02/22/2015 07:15   Dg Chest Port 1 View  02/21/2015   CLINICAL DATA:  Productive cough for 1 day.  Shortness of breath.  EXAM: PORTABLE CHEST - 1 VIEW  COMPARISON:  02/15/2015.  FINDINGS: Poor inspiration. The cardiac silhouette remains borderline enlarged. The right PICC is unchanged. Stable elevation of the right hemidiaphragm. Minimal right basilar atelectasis. Clear left lung. Mildly prominent interstitial markings without significant change. Thoracic spine degenerative changes. Cholecystectomy clips. Right upper quadrant abdominal pigtail catheter. Nasogastric tube tip in the proximal stomach. Bilateral shoulder degenerative changes, greater on the right.  IMPRESSION: 1. Poor inspiration with minimal right basilar atelectasis. 2. Mild chronic interstitial lung disease.   Electronically Signed   By: Beckie Salts M.D.   On: 02/21/2015 09:24   Dg Chest Port 1 View  02/15/2015   CLINICAL DATA:  Respiratory failure  EXAM: PORTABLE CHEST - 1 VIEW  COMPARISON:  02/14/2015  FINDINGS: The endotracheal tube and nasogastric tube have been  removed. The left jugular central line extends into the SVC. There is unchanged right hemidiaphragm elevation. There is continued improvement, with ongoing resolution of interstitial edema. No confluent airspace consolidation is evident.  IMPRESSION: Continued improvement. Removal of endotracheal tube and nasogastric tube.   Electronically Signed   By: Ellery Plunk M.D.   On: 02/15/2015 06:49   Dg Chest Port 1 View  02/14/2015   CLINICAL DATA:  Respiratory acidosis, coronary artery disease, MI, intubated patient.  EXAM: PORTABLE CHEST - 1 VIEW  COMPARISON:  Portable chest x-ray of February 13, 2015  FINDINGS: The lungs remain hypoinflated. Elevation of the right hemidiaphragm is stable. The interstitial markings are less conspicuous bilaterally. The cardiac silhouette remains enlarged. The pulmonary vascularity is less engorged.  The endotracheal tube tip lies 3.8 cm above the carina. The esophagogastric tube tip lies in the gastric cardia with the proximal port just distal to the GE junction the left internal jugular venous catheter tip projects over the proximal SVC. The PICC line tip projects over the junction of the middle and distal portions of the SVC. There are chronic changes associated with both shoulders.  IMPRESSION: Interval improvement in the pulmonary interstitium consistent with resolving interstitial edema. Persistent elevation of the right hemidiaphragm and likely right basilar atelectasis or pneumonia. Support tubes in reasonable position.   Electronically Signed   By: David  [place] M.D.   On: 02/14/2015 07:07   Dg Chest Port 1 View  02/13/2015   CLINICAL DATA:  Acute respiratory failure. Hypertension. Coronary artery disease. Diabetes. Myocardial infarction.  EXAM: PORTABLE CHEST - 1 VIEW  COMPARISON:  One day prior  FINDINGS: Endotracheal tube terminates 1.9 cm above carina. Left internal jugular line tip at high SVC. Right-sided PICC line terminates at mid to low SVC. Nasogastric terminates at the body of the stomach. Cardiomegaly accentuated by AP portable technique. Right hemidiaphragm elevation is moderate. Suspect a small right pleural effusion. No pneumothorax. Interstitial edema is mild and similar, given differences in inspiratory effort. Mild right base volume loss without lobar consolidation.  IMPRESSION: Given differences in inspiratory effort, similar mild interstitial edema.  Probable trace right pleural fluid.   Electronically Signed   By: Jeronimo Greaves M.D.   On: 02/13/2015 08:49   Dg Chest Port 1 View  02/12/2015   CLINICAL DATA:  Acute respiratory failure.  History of hypertension, atrial fibrillation, diabetes and myocardial infarction.  EXAM: PORTABLE CHEST - 1 VIEW  COMPARISON:  02/11/2015 and 02/10/2015.  FINDINGS: 0512 hours. Endotracheal tube tip is unchanged at approximately 2.5 cm above the carina. A right arm PICC tip extends into the right atrium. Left IJ central venous catheter extends to the lower SVC level. Nasogastric tube projects below the diaphragm. There are persistent low volumes with overall slightly improved pulmonary aeration. No edema, confluent airspace opacity, pneumothorax or significant pleural effusion identified. The heart size and mediastinal contours are stable.  IMPRESSION: Interval slight improved aeration of the lungs. Stable support system.   Electronically Signed   By: Carey Bullocks M.D.   On: 02/12/2015 09:08   Dg Chest Port 1 View  02/11/2015   CLINICAL DATA:  Central line placement.  EXAM: PORTABLE CHEST - 1 VIEW  COMPARISON:  02/10/2015  FINDINGS: Interval placement of endotracheal tube with tip measuring 2.3 cm above the carina. Enteric tube was placed with tip off the field of view but below the left hemidiaphragm. Left central venous catheter was placed with tip over the low SVC region. No  pneumothorax. Shallow inspiration. Mild cardiac enlargement. Linear atelectasis in the right lung base. No consolidation in the lungs. No blunting of costophrenic angles. No pneumothorax. Degenerative changes in the spine and shoulders.  IMPRESSION: Appliances appear to be in satisfactory position. Shallow inspiration with atelectasis in the right lung base. Cardiac enlargement.   Electronically Signed   By: Burman Nieves M.D.   On: 02/11/2015 03:04   Dg Chest Port 1 View  02/10/2015   CLINICAL DATA:  Projectile vomiting and difficulty breathing  EXAM: PORTABLE CHEST - 1 VIEW  COMPARISON:  02/08/2015  FINDINGS: Left jugular central line and right-sided PICC line are again identified and stable. The overall inspiratory effort is  poor. The left lung is clear. Elevation of the right hemidiaphragm is noted. Mild vascular crowding is seen. Cardiac shadow is enlarged.  IMPRESSION: Poor inspiratory effort with vascular crowding. No focal confluent infiltrate is seen.   Electronically Signed   By: Alcide Clever M.D.   On: 02/10/2015 11:35   Dg Chest Port 1 View  02/08/2015   CLINICAL DATA:  Perihepatic hematoma post cholecystectomy  EXAM: PORTABLE CHEST - 1 VIEW  COMPARISON:  02/05/2015 and earlier studies  FINDINGS: Left IJ hemodialysis catheter placed to the low SVC. Right arm PICC line to the low SVC. Low lung volumes. No pneumothorax. Perihilar atelectasis or infiltrates right greater than left, slightly increased. No definite effusion. Heart size upper limits normal for technique.  Spondylitic changes near the thoracolumbar junction.  IMPRESSION: 1. Left IJ Dialysis catheter and right arm PICC placement to the low SVC without pneumothorax.   Electronically Signed   By: Corlis Leak M.D.   On: 02/08/2015 12:31   Dg Chest Port 1 View  02/05/2015   CLINICAL DATA:  Recent nausea and vomiting and shortness of Breath  EXAM: PORTABLE CHEST - 1 VIEW  COMPARISON:  01/30/2015  FINDINGS: Cardiac shadow is enlarged but stable. A right jugular line is again seen and stable. Elevation the right hemidiaphragm is again noted. A right upper quadrant drainage catheter is again noted. No focal infiltrate or sizable effusion is seen.  IMPRESSION: No acute abnormality noted.   Electronically Signed   By: Alcide Clever M.D.   On: 02/05/2015 11:52   Dg Chest Port 1 View  01/30/2015   CLINICAL DATA:  Dyspnea  EXAM: PORTABLE CHEST - 1 VIEW  COMPARISON:  01/21/2015  FINDINGS: Cardiomediastinal silhouette is stable. Right IJ central line with tip in right atrium again noted. No pulmonary edema. There is chronic elevation of the right hemidiaphragm with right basilar atelectasis.  IMPRESSION: Right IJ central line with tip in right atrium again noted. No pulmonary  edema. There is chronic elevation of the right hemidiaphragm with right basilar atelectasis.   Electronically Signed   By: Natasha Mead M.D.   On: 01/30/2015 10:25   Dg Abd Portable 1v  02/12/2015   CLINICAL DATA:  OG tube placement  EXAM: PORTABLE ABDOMEN - 1 VIEW  COMPARISON:  Portable exam 0958 hours compared to CT abdomen and pelvis of 02/07/2015  FINDINGS: Tip of orogastric tube is at the gastroesophageal junction, recommend advancing tube into stomach.  Percutaneous pigtail drainage catheter RIGHT upper quadrant, by prior CT within gallbladder fossa collection.  Bowel gas pattern is suboptimally visualized due to technique and rotation.  Bones demineralized.  IMPRESSION: Tip of orogastric tube is at gastroesophageal junction, recommend advancing tube into stomach.   Electronically Signed   By: Ulyses Southward M.D.   On:  02/12/2015 14:09    Microbiology: Recent Results (from the past 240 hour(s))  Culture, body fluid-bottle     Status: None   Collection Time: 02/16/15  3:56 PM  Result Value Ref Range Status   Specimen Description FLUID PERITONEAL  Final   Special Requests NONE  Final   Gram Stain   Final    GRAM NEGATIVE RODS ANAEROBIC BOTTLE ONLY CRITICAL RESULT CALLED TO, READ BACK BY AND VERIFIED WITH: Mellody Life RN [phone] GREEN R CONFIRMED BY K. BARR    Culture   Final    KLEBSIELLA PNEUMONIAE Performed at [facility]    Report Status 02/23/2015 FINAL  Final   Organism ID, Bacteria KLEBSIELLA PNEUMONIAE  Final      Susceptibility   Klebsiella pneumoniae - MIC*    AMPICILLIN >=32 RESISTANT Resistant     CEFAZOLIN >=64 RESISTANT Resistant     CEFEPIME <=1 SENSITIVE Sensitive     CEFTAZIDIME 4 SENSITIVE Sensitive     CEFTRIAXONE <=1 SENSITIVE Sensitive     CIPROFLOXACIN <=0.25 SENSITIVE Sensitive     GENTAMICIN <=1 SENSITIVE Sensitive     IMIPENEM <=0.25 SENSITIVE Sensitive     TRIMETH/SULFA <=20 SENSITIVE Sensitive     AMPICILLIN/SULBACTAM >=32 RESISTANT Resistant      PIP/TAZO >=128 RESISTANT Resistant     * KLEBSIELLA PNEUMONIAE  Gram stain     Status: None   Collection Time: 02/16/15  3:56 PM  Result Value Ref Range Status   Specimen Description FLUID PERITONEAL  Final   Special Requests NONE  Final   Gram Stain   Final    ABUNDANT WBC PRESENT,BOTH PMN AND MONONUCLEAR NO ORGANISMS SEEN Performed at [facility]    Report Status 02/16/2015 FINAL  Final  C difficile quick scan w PCR reflex     Status: None   Collection Time: 02/17/15 12:00 PM  Result Value Ref Range Status   C Diff antigen NEGATIVE NEGATIVE Final   C Diff toxin NEGATIVE NEGATIVE Final   C Diff interpretation Negative for toxigenic C. difficile  Final     Labs: Basic Metabolic Panel:  Recent Labs Lab 02/22/15 0515 02/22/15 2018 02/23/15 0550 02/24/15 0455 02/25/15 0840 02/26/15 0455  NA 139 139 141 141 143 143  K 3.2* 3.8 3.3* 3.7 3.6 4.6  CL 108 109 110 114* 116* 117*  CO2 21* 21* 21* 20* 20* 21*  GLUCOSE 275* 246* 152* 138* 101* 178*  BUN 108* 103* 99* 94* 77* 70*  CREATININE 3.02* 2.92* 2.73* 2.42* 2.11* 2.01*  CALCIUM 8.0* 8.2* 8.3* 8.1* 8.2* 8.0*  MG 1.7  --  1.8 1.7 1.6* 1.8  PHOS 4.3 3.1 3.1 3.0 3.1 3.7   Liver Function Tests:  Recent Labs Lab 02/21/15 0555 02/21/15 1730 02/22/15 0515 02/22/15 2018 02/23/15 0550 02/24/15 0455  AST 29  --   --   --   --  27  ALT 18  --   --   --   --  25  ALKPHOS 113  --   --   --   --  114  BILITOT 1.2  --   --   --   --  0.9  PROT 6.1*  --   --   --   --  6.1*  ALBUMIN 2.0* 2.0* 1.9* 2.1* 2.0* 2.0*   No results for input(s): LIPASE, AMYLASE in the last 168 hours. No results for input(s): AMMONIA in the last 168 hours. CBC:  Recent Labs Lab  02/21/15 0555 02/22/15 0515 02/23/15 0550 02/24/15 0455 02/25/15 0840 02/26/15 0455  WBC 10.5 10.1 11.8* 9.8 5.5 4.1  NEUTROABS 9.5*  --   --   --   --   --   HGB 6.8* 8.6* 8.9* 9.1* 9.8* 9.1*  HCT 21.5* 27.6* 28.3* 27.3* 30.3* 27.9*  MCV 93.5 93.6  93.4 93.5 93.8 94.6  PLT 298 327 371 377 401* 391   Cardiac Enzymes: No results for input(s): CKTOTAL, CKMB, CKMBINDEX, TROPONINI in the last 168 hours. BNP: BNP (last 3 results) No results for input(s): BNP in the last 8760 hours.  ProBNP (last 3 results) No results for input(s): PROBNP in the last 8760 hours.  CBG:  Recent Labs Lab 02/25/15 1203 02/25/15 1610 02/25/15 2127 02/26/15 0735 02/26/15 1206  GLUCAP 123* 137* 173* 164* 156*    Discussed with spouse, updated care and answered questions.    Signed:  Marcellus Scott, MD, FACP, FHM. [facility] Pager [phone]  If 7PM-7AM, please contact night-coverage [email or web address] Password [email or web address] 02/26/2015, 12:17 PM

## 2015-03-03 ENCOUNTER — Other Ambulatory Visit: Payer: Self-pay

## 2015-03-03 DIAGNOSIS — K9189 Other postprocedural complications and disorders of digestive system: Principal | ICD-10-CM

## 2015-03-03 DIAGNOSIS — K838 Other specified diseases of biliary tract: Secondary | ICD-10-CM

## 2015-03-10 ENCOUNTER — Inpatient Hospital Stay (HOSPITAL_COMMUNITY): Payer: Medicare Other | Admitting: Anesthesiology

## 2015-03-10 ENCOUNTER — Inpatient Hospital Stay (HOSPITAL_COMMUNITY)
Admission: EM | Admit: 2015-03-10 | Discharge: 2015-03-14 | DRG: 394 | Disposition: A | Discharge status: Home Health | Payer: Medicare Other | Attending: General Surgery | Admitting: General Surgery

## 2015-03-10 ENCOUNTER — Ambulatory Visit (HOSPITAL_COMMUNITY)
Admission: RE | Admit: 2015-03-10 | Discharge: 2015-03-10 | Disposition: A | Discharge status: Home / Self Care | Payer: Medicare Other | Source: Ambulatory Visit | Attending: General Surgery | Admitting: General Surgery

## 2015-03-10 ENCOUNTER — Inpatient Hospital Stay (HOSPITAL_COMMUNITY): Payer: Medicare Other

## 2015-03-10 ENCOUNTER — Encounter (HOSPITAL_COMMUNITY)
Admission: EM | Disposition: A | Discharge status: Home Health | Payer: Self-pay | Source: Home / Self Care | Attending: General Surgery

## 2015-03-10 ENCOUNTER — Encounter (HOSPITAL_COMMUNITY): Payer: Self-pay

## 2015-03-10 DIAGNOSIS — Z794 Long term (current) use of insulin: Secondary | ICD-10-CM | POA: Diagnosis not present

## 2015-03-10 DIAGNOSIS — K91840 Postprocedural hemorrhage and hematoma of a digestive system organ or structure following a digestive system procedure: Secondary | ICD-10-CM | POA: Diagnosis present

## 2015-03-10 DIAGNOSIS — I252 Old myocardial infarction: Secondary | ICD-10-CM

## 2015-03-10 DIAGNOSIS — Z79891 Long term (current) use of opiate analgesic: Secondary | ICD-10-CM

## 2015-03-10 DIAGNOSIS — Z79899 Other long term (current) drug therapy: Secondary | ICD-10-CM | POA: Diagnosis not present

## 2015-03-10 DIAGNOSIS — E440 Moderate protein-calorie malnutrition: Secondary | ICD-10-CM | POA: Diagnosis present

## 2015-03-10 DIAGNOSIS — K9189 Other postprocedural complications and disorders of digestive system: Secondary | ICD-10-CM | POA: Diagnosis present

## 2015-03-10 DIAGNOSIS — E785 Hyperlipidemia, unspecified: Secondary | ICD-10-CM | POA: Diagnosis present

## 2015-03-10 DIAGNOSIS — I129 Hypertensive chronic kidney disease with stage 1 through stage 4 chronic kidney disease, or unspecified chronic kidney disease: Secondary | ICD-10-CM | POA: Diagnosis present

## 2015-03-10 DIAGNOSIS — E1129 Type 2 diabetes mellitus with other diabetic kidney complication: Secondary | ICD-10-CM | POA: Diagnosis present

## 2015-03-10 DIAGNOSIS — Y836 Removal of other organ (partial) (total) as the cause of abnormal reaction of the patient, or of later complication, without mention of misadventure at the time of the procedure: Secondary | ICD-10-CM | POA: Diagnosis present

## 2015-03-10 DIAGNOSIS — N189 Chronic kidney disease, unspecified: Secondary | ICD-10-CM | POA: Diagnosis present

## 2015-03-10 DIAGNOSIS — E876 Hypokalemia: Secondary | ICD-10-CM | POA: Diagnosis not present

## 2015-03-10 DIAGNOSIS — E1122 Type 2 diabetes mellitus with diabetic chronic kidney disease: Secondary | ICD-10-CM | POA: Diagnosis present

## 2015-03-10 DIAGNOSIS — K839 Disease of biliary tract, unspecified: Secondary | ICD-10-CM | POA: Diagnosis present

## 2015-03-10 DIAGNOSIS — Z6837 Body mass index (BMI) 37.0-37.9, adult: Secondary | ICD-10-CM

## 2015-03-10 DIAGNOSIS — I2510 Atherosclerotic heart disease of native coronary artery without angina pectoris: Secondary | ICD-10-CM | POA: Diagnosis not present

## 2015-03-10 DIAGNOSIS — R609 Edema, unspecified: Secondary | ICD-10-CM | POA: Diagnosis present

## 2015-03-10 DIAGNOSIS — I480 Paroxysmal atrial fibrillation: Secondary | ICD-10-CM | POA: Diagnosis not present

## 2015-03-10 DIAGNOSIS — K8050 Calculus of bile duct without cholangitis or cholecystitis without obstruction: Secondary | ICD-10-CM

## 2015-03-10 DIAGNOSIS — IMO0002 Reserved for concepts with insufficient information to code with codable children: Secondary | ICD-10-CM | POA: Diagnosis present

## 2015-03-10 DIAGNOSIS — K838 Other specified diseases of biliary tract: Secondary | ICD-10-CM | POA: Diagnosis present

## 2015-03-10 DIAGNOSIS — D638 Anemia in other chronic diseases classified elsewhere: Secondary | ICD-10-CM | POA: Diagnosis present

## 2015-03-10 HISTORY — PX: ERCP: SHX5425

## 2015-03-10 LAB — COMPREHENSIVE METABOLIC PANEL
ALBUMIN: 2.3 g/dL — AB (ref 3.5–5.0)
ALK PHOS: 83 U/L (ref 38–126)
ALT: 12 U/L — ABNORMAL LOW (ref 17–63)
ANION GAP: 11 (ref 5–15)
AST: 32 U/L (ref 15–41)
BUN: 15 mg/dL (ref 6–20)
CALCIUM: 7.7 mg/dL — AB (ref 8.9–10.3)
CO2: 23 mmol/L (ref 22–32)
Chloride: 107 mmol/L (ref 101–111)
Creatinine, Ser: 1.67 mg/dL — ABNORMAL HIGH (ref 0.61–1.24)
GFR calc Af Amer: 47 mL/min — ABNORMAL LOW (ref >60)
GFR calc non Af Amer: 40 mL/min — ABNORMAL LOW (ref >60)
GLUCOSE: 105 mg/dL — AB (ref 65–99)
Potassium: 3.7 mmol/L (ref 3.5–5.1)
SODIUM: 141 mmol/L (ref 135–145)
Total Bilirubin: 0.7 mg/dL (ref 0.3–1.2)
Total Protein: 6.1 g/dL — ABNORMAL LOW (ref 6.5–8.1)

## 2015-03-10 LAB — URINALYSIS, ROUTINE W REFLEX MICROSCOPIC
Bilirubin Urine: NEGATIVE
Glucose, UA: NEGATIVE mg/dL
Ketones, ur: NEGATIVE mg/dL
NITRITE: NEGATIVE
PH: 5.5 (ref 5.0–8.0)
Protein, ur: 30 mg/dL — AB
SPECIFIC GRAVITY, URINE: 1.016 (ref 1.005–1.030)
Urobilinogen, UA: 0.2 mg/dL (ref 0.0–1.0)

## 2015-03-10 LAB — PROTIME-INR
INR: 1.39 (ref 0.00–1.49)
Prothrombin Time: 17.2 seconds — ABNORMAL HIGH (ref 11.6–15.2)

## 2015-03-10 LAB — CBC WITH DIFFERENTIAL/PLATELET
Basophils Absolute: 0 10*3/uL (ref 0.0–0.1)
Basophils Relative: 0 %
EOS ABS: 0.1 10*3/uL (ref 0.0–0.7)
Eosinophils Relative: 2 %
HEMATOCRIT: 31.8 % — AB (ref 39.0–52.0)
HEMOGLOBIN: 10 g/dL — AB (ref 13.0–17.0)
LYMPHS ABS: 1.4 10*3/uL (ref 0.7–4.0)
Lymphocytes Relative: 20 %
MCH: 30.1 pg (ref 26.0–34.0)
MCHC: 31.4 g/dL (ref 30.0–36.0)
MCV: 95.8 fL (ref 78.0–100.0)
MONOS PCT: 9 %
Monocytes Absolute: 0.6 10*3/uL (ref 0.1–1.0)
NEUTROS ABS: 4.8 10*3/uL (ref 1.7–7.7)
NEUTROS PCT: 69 %
Platelets: 219 10*3/uL (ref 150–400)
RBC: 3.32 MIL/uL — ABNORMAL LOW (ref 4.22–5.81)
RDW: 20.8 % — ABNORMAL HIGH (ref 11.5–15.5)
WBC: 7 10*3/uL (ref 4.0–10.5)

## 2015-03-10 LAB — URINE MICROSCOPIC-ADD ON

## 2015-03-10 LAB — BRAIN NATRIURETIC PEPTIDE: B NATRIURETIC PEPTIDE 5: 163.1 pg/mL — AB (ref 0.0–100.0)

## 2015-03-10 LAB — LIPASE, BLOOD: Lipase: 32 U/L (ref 22–51)

## 2015-03-10 SURGERY — ERCP, WITH INTERVENTION IF INDICATED
Anesthesia: General

## 2015-03-10 MED ORDER — ENSURE ENLIVE PO LIQD
237.0000 mL | Freq: Two times a day (BID) | ORAL | Status: DC
  Administered 2015-03-12 – 2015-03-13 (×4): 237 mL via ORAL
  Filled 2015-03-10: qty 237

## 2015-03-10 MED ORDER — FENTANYL CITRATE (PF) 100 MCG/2ML IJ SOLN
INTRAMUSCULAR | Status: DC | PRN
  Administered 2015-03-10: 50 ug via INTRAVENOUS

## 2015-03-10 MED ORDER — LORAZEPAM 0.5 MG PO TABS
0.5000 mg | ORAL_TABLET | Freq: Three times a day (TID) | ORAL | Status: DC | PRN
  Administered 2015-03-14: 0.5 mg via ORAL
  Filled 2015-03-10: qty 1

## 2015-03-10 MED ORDER — POTASSIUM CHLORIDE IN NACL 20-0.9 MEQ/L-% IV SOLN
INTRAVENOUS | Status: DC
Start: 2015-03-10 — End: 2015-03-14
  Administered 2015-03-11 (×2): via INTRAVENOUS
  Administered 2015-03-12: 1000 mL via INTRAVENOUS
  Administered 2015-03-13: 16:00:00 via INTRAVENOUS
  Filled 2015-03-10 (×7): qty 1000

## 2015-03-10 MED ORDER — LEVALBUTEROL HCL 0.63 MG/3ML IN NEBU: 0.6300 mg | INHALATION_SOLUTION | Freq: Four times a day (QID) | RESPIRATORY_TRACT | Status: DC | PRN

## 2015-03-10 MED ORDER — PROSOURCE NO CARB PO LIQD: 30.0000 mL | Freq: Two times a day (BID) | ORAL | Status: DC

## 2015-03-10 MED ORDER — LIDOCAINE HCL (CARDIAC) 20 MG/ML IV SOLN
INTRAVENOUS | Status: AC
  Filled 2015-03-10: qty 5

## 2015-03-10 MED ORDER — LACTINEX PO CHEW
1.0000 | CHEWABLE_TABLET | Freq: Two times a day (BID) | ORAL | Status: DC
  Administered 2015-03-11 – 2015-03-14 (×7): 1 via ORAL
  Filled 2015-03-10 (×10): qty 1

## 2015-03-10 MED ORDER — LACTATED RINGERS IV SOLN
INTRAVENOUS | Status: DC
  Administered 2015-03-10: 16:00:00 via INTRAVENOUS

## 2015-03-10 MED ORDER — GLUCAGON HCL RDNA (DIAGNOSTIC) 1 MG IJ SOLR
INTRAMUSCULAR | Status: AC
  Filled 2015-03-10: qty 1

## 2015-03-10 MED ORDER — PRO-STAT SUGAR FREE PO LIQD
30.0000 mL | Freq: Two times a day (BID) | ORAL | Status: DC
  Administered 2015-03-10 – 2015-03-14 (×6): 30 mL via ORAL
  Filled 2015-03-10 (×8): qty 30

## 2015-03-10 MED ORDER — SUCCINYLCHOLINE CHLORIDE 20 MG/ML IJ SOLN
INTRAMUSCULAR | Status: DC | PRN
  Administered 2015-03-10: 100 mg via INTRAVENOUS

## 2015-03-10 MED ORDER — ONDANSETRON HCL 4 MG/2ML IJ SOLN
4.0000 mg | Freq: Four times a day (QID) | INTRAMUSCULAR | Status: DC | PRN
  Administered 2015-03-10 – 2015-03-13 (×2): 4 mg via INTRAVENOUS
  Filled 2015-03-10: qty 2

## 2015-03-10 MED ORDER — ACETAMINOPHEN 650 MG RE SUPP
650.0000 mg | Freq: Four times a day (QID) | RECTAL | Status: DC | PRN
Start: 2015-03-10 — End: 2015-03-14

## 2015-03-10 MED ORDER — CETYLPYRIDINIUM CHLORIDE 0.05 % MT LIQD
7.0000 mL | Freq: Two times a day (BID) | OROMUCOSAL | Status: DC
  Administered 2015-03-11 – 2015-03-13 (×3): 7 mL via OROMUCOSAL

## 2015-03-10 MED ORDER — ACETAMINOPHEN 325 MG PO TABS: 650.0000 mg | ORAL_TABLET | Freq: Four times a day (QID) | ORAL | Status: DC | PRN

## 2015-03-10 MED ORDER — PROPOFOL 10 MG/ML IV BOLUS
INTRAVENOUS | Status: AC
  Filled 2015-03-10: qty 20

## 2015-03-10 MED ORDER — ROCURONIUM BROMIDE 100 MG/10ML IV SOLN
INTRAVENOUS | Status: AC
  Filled 2015-03-10: qty 1

## 2015-03-10 MED ORDER — DEXTROSE 5 % IV SOLN
INTRAVENOUS | Status: AC
  Filled 2015-03-10: qty 2

## 2015-03-10 MED ORDER — HYDROMORPHONE HCL 1 MG/ML IJ SOLN: 0.2500 mg | INTRAMUSCULAR | Status: DC | PRN

## 2015-03-10 MED ORDER — MIDAZOLAM HCL 2 MG/2ML IJ SOLN
INTRAMUSCULAR | Status: AC
  Filled 2015-03-10: qty 4

## 2015-03-10 MED ORDER — SODIUM CHLORIDE 0.9 % IV SOLN
INTRAVENOUS | Status: DC | PRN
  Administered 2015-03-10: 20 mL

## 2015-03-10 MED ORDER — LACTATED RINGERS IV SOLN: INTRAVENOUS | Status: DC

## 2015-03-10 MED ORDER — PROPOFOL 10 MG/ML IV BOLUS
INTRAVENOUS | Status: DC | PRN
  Administered 2015-03-10: 150 mg via INTRAVENOUS

## 2015-03-10 MED ORDER — HYDROMORPHONE HCL 1 MG/ML IJ SOLN
0.5000 mg | INTRAMUSCULAR | Status: DC | PRN
  Administered 2015-03-12: 0.5 mg via INTRAVENOUS
  Filled 2015-03-10: qty 1

## 2015-03-10 MED ORDER — GLUCAGON HCL RDNA (DIAGNOSTIC) 1 MG IJ SOLR
INTRAMUSCULAR | Status: DC | PRN
  Administered 2015-03-10: .5 mg via INTRAVENOUS

## 2015-03-10 MED ORDER — HEPARIN SODIUM (PORCINE) 5000 UNIT/ML IJ SOLN
5000.0000 [IU] | Freq: Three times a day (TID) | INTRAMUSCULAR | Status: DC
Start: 2015-03-10 — End: 2015-03-10
  Filled 2015-03-10 (×3): qty 1

## 2015-03-10 MED ORDER — OXYCODONE-ACETAMINOPHEN 5-325 MG PO TABS
1.0000 | ORAL_TABLET | ORAL | Status: DC | PRN
Start: 2015-03-10 — End: 2015-03-14
  Administered 2015-03-11: 1 via ORAL
  Administered 2015-03-11 – 2015-03-12 (×3): 2 via ORAL
  Administered 2015-03-13: 1 via ORAL
  Filled 2015-03-10: qty 1
  Filled 2015-03-10: qty 2
  Filled 2015-03-10: qty 1
  Filled 2015-03-10 (×2): qty 2

## 2015-03-10 MED ORDER — PANTOPRAZOLE SODIUM 40 MG PO TBEC
40.0000 mg | DELAYED_RELEASE_TABLET | Freq: Every day | ORAL | Status: DC
  Administered 2015-03-11 – 2015-03-14 (×4): 40 mg via ORAL
  Filled 2015-03-10 (×4): qty 1

## 2015-03-10 MED ORDER — LORAZEPAM 0.5 MG PO TABS
0.5000 mg | ORAL_TABLET | Freq: Every day | ORAL | Status: DC
  Administered 2015-03-10 – 2015-03-13 (×4): 0.5 mg via ORAL
  Filled 2015-03-10 (×4): qty 1

## 2015-03-10 MED ORDER — ATORVASTATIN CALCIUM 40 MG PO TABS
80.0000 mg | ORAL_TABLET | Freq: Every day | ORAL | Status: DC
  Administered 2015-03-10 – 2015-03-13 (×4): 80 mg via ORAL
  Filled 2015-03-10 (×5): qty 2
  Filled 2015-03-10: qty 1

## 2015-03-10 MED ORDER — PHENYLEPHRINE HCL 10 MG/ML IJ SOLN
INTRAMUSCULAR | Status: DC | PRN
  Administered 2015-03-10 (×2): 40 ug via INTRAVENOUS
  Administered 2015-03-10: 80 ug via INTRAVENOUS
  Administered 2015-03-10: 40 ug via INTRAVENOUS

## 2015-03-10 MED ORDER — GLUCOSE 4 G PO CHEW
1.0000 | CHEWABLE_TABLET | ORAL | Status: DC | PRN
  Filled 2015-03-10: qty 1

## 2015-03-10 MED ORDER — METRONIDAZOLE 500 MG PO TABS
500.0000 mg | ORAL_TABLET | Freq: Four times a day (QID) | ORAL | Status: DC
  Administered 2015-03-10 – 2015-03-14 (×14): 500 mg via ORAL
  Filled 2015-03-10 (×14): qty 1

## 2015-03-10 MED ORDER — HEPARIN SODIUM (PORCINE) 5000 UNIT/ML IJ SOLN
5000.0000 [IU] | Freq: Three times a day (TID) | INTRAMUSCULAR | Status: DC
  Filled 2015-03-10 (×2): qty 1

## 2015-03-10 MED ORDER — LIDOCAINE HCL (CARDIAC) 20 MG/ML IV SOLN
INTRAVENOUS | Status: DC | PRN
  Administered 2015-03-10: 50 mg via INTRAVENOUS

## 2015-03-10 MED ORDER — FENTANYL CITRATE (PF) 250 MCG/5ML IJ SOLN
INTRAMUSCULAR | Status: AC
  Filled 2015-03-10: qty 25

## 2015-03-10 MED ORDER — INSULIN ASPART 100 UNIT/ML ~~LOC~~ SOLN
0.0000 [IU] | Freq: Three times a day (TID) | SUBCUTANEOUS | Status: DC
Start: 2015-03-10 — End: 2015-03-11

## 2015-03-10 MED ORDER — ONDANSETRON 4 MG PO TBDP: 4.0000 mg | ORAL_TABLET | Freq: Four times a day (QID) | ORAL | Status: DC | PRN

## 2015-03-10 MED ORDER — FUROSEMIDE 40 MG PO TABS
40.0000 mg | ORAL_TABLET | Freq: Two times a day (BID) | ORAL | Status: DC
  Administered 2015-03-11 – 2015-03-14 (×7): 40 mg via ORAL
  Filled 2015-03-10 (×7): qty 1

## 2015-03-10 MED ORDER — TRAMADOL HCL 50 MG PO TABS: 50.0000 mg | ORAL_TABLET | Freq: Four times a day (QID) | ORAL | Status: DC | PRN

## 2015-03-10 MED ORDER — BACID PO TABS
1.0000 | ORAL_TABLET | Freq: Two times a day (BID) | ORAL | Status: DC
  Filled 2015-03-10: qty 1

## 2015-03-10 MED ORDER — TECHNETIUM TC 99M MEBROFENIN IV KIT
5.3000 | PACK | Freq: Once | INTRAVENOUS | Status: DC | PRN
  Administered 2015-03-10: 5 via INTRAVENOUS
  Filled 2015-03-10: qty 6

## 2015-03-10 MED ORDER — ONDANSETRON HCL 4 MG/2ML IJ SOLN
INTRAMUSCULAR | Status: AC
  Filled 2015-03-10: qty 2

## 2015-03-10 MED ORDER — CEFTRIAXONE SODIUM 2 G IJ SOLR
2.0000 g | INTRAMUSCULAR | Status: DC
  Administered 2015-03-10 – 2015-03-14 (×5): 2 g via INTRAVENOUS
  Filled 2015-03-10 (×4): qty 2

## 2015-03-10 MED ORDER — POTASSIUM CHLORIDE CRYS ER 20 MEQ PO TBCR: 20.0000 meq | EXTENDED_RELEASE_TABLET | Freq: Once | ORAL | Status: DC

## 2015-03-10 NOTE — ED Notes (Signed)
Bed: ZO10 Expected date:  Expected time:  Means of arrival:  Comments: EMS- 69yo M, bile leakage/recent gallbladder removal

## 2015-03-10 NOTE — ED Notes (Signed)
Per ems pt is from [facility] in [place]. Pt is at facility recovering from gall bladder removal. Paperwork states "Per Dr [name], send to ER for bile leak".   Upon rn assessment, pt states he was in the hospital for 30 days, and sent to rehab, has been in rehab facility for 2 weeks. Pt denies pain. Alert and oriented x4. Pt had nausea ealier today, was given phenergan by facility.
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
## 2015-03-10 NOTE — ED Notes (Signed)
md at bedside

## 2015-03-10 NOTE — Transfer of Care (Signed)
Immediate Anesthesia Transfer of Care Note  Patient: Larry [name]  Procedure(s) Performed: Procedure(s): ENDOSCOPIC RETROGRADE CHOLANGIOPANCREATOGRAPHY (ERCP) (N/A)  Patient Location: PACU  Anesthesia Type:General  Level of Consciousness: awake, alert  and oriented  Airway & Oxygen Therapy: Patient Spontanous Breathing and Patient connected to face mask oxygen  Post-op Assessment: Report given to RN and Post -op Vital signs reviewed and stable  Post vital signs: Reviewed and stable  Last Vitals:  Filed Vitals:   03/10/15 1510  BP: 107/65  Pulse: 99  Temp: 36.9 C  Resp: 17    Complications: No apparent anesthesia complications

## 2015-03-10 NOTE — Interval H&P Note (Signed)
History and Physical Interval Note:  03/10/2015 4:15 PM  Larry [name]  has presented today for surgery, with the diagnosis of cholelithiasis  The various methods of treatment have been discussed with the patient and family. After consideration of risks, benefits and other options for treatment, the patient has consented to  Procedure(s): ENDOSCOPIC RETROGRADE CHOLANGIOPANCREATOGRAPHY (ERCP) (N/A) as a surgical intervention .  The patient's history has been reviewed, patient examined, no change in status, stable for surgery.  I have reviewed the patient's chart and labs.  Questions were answered to the patient's satisfaction.     HUNG,PATRICK D

## 2015-03-10 NOTE — ED Notes (Signed)
Pt to OR.

## 2015-03-10 NOTE — Anesthesia Procedure Notes (Signed)
Procedure Name: Intubation Date/Time: 03/10/2015 4:28 PM Performed by: Thornell Mule Pre-anesthesia Checklist: Patient identified, Emergency Drugs available, Suction available and Patient being monitored Patient Re-evaluated:Patient Re-evaluated prior to inductionOxygen Delivery Method: Circle System Utilized Preoxygenation: Pre-oxygenation with 100% oxygen Intubation Type: IV induction Ventilation: Mask ventilation without difficulty Laryngoscope Size: Miller and 3 Grade View: Grade I Tube type: Oral Tube size: 7.5 mm Number of attempts: 1 Airway Equipment and Method: Stylet and Oral airway Placement Confirmation: ETT inserted through vocal cords under direct vision,  positive ETCO2 and breath sounds checked- equal and bilateral Secured at: 21 cm Tube secured with: Tape Dental Injury: Teeth and Oropharynx as per pre-operative assessment

## 2015-03-10 NOTE — H&P (View-Only) (Signed)
Pt transferred to [facility] at this time via [place]. Crew in possession of transfer packet and all pt belongings. Pt stable at time of d/c.

## 2015-03-10 NOTE — Op Note (Signed)
[facility] [address] [place], [address]   ERCP PROCEDURE REPORT        EXAM DATE: 03/10/2015  PATIENT NAME:          Larry [name], Larry [name]          MR #: [id number] BIRTHDATE:       06-23-1945     VISIT #:     [id number] ATTENDING:     [name], MD     STATUS:     inpatient ASSISTANT:      Lowry Bowl, and Arlee Muslim  INDICATIONS:  The patient is a 69 yr old male here for an ERCP due to bile leak. PROCEDURE PERFORMED:     ERCP with stent placement MEDICATIONS:     General Anesthesia  CONSENT: The patient understands the risks and benefits of the procedure and understands that these risks include, but are not limited to: sedation, allergic reaction, infection, perforation and/or bleeding. Alternative means of evaluation and treatment include, among others: physical exam, x-rays, and/or surgical intervention. The patient elects to proceed with this endoscopic procedure.  DESCRIPTION OF PROCEDURE: During intra-op preparation period all mechanical & medical equipment was checked for proper function. Hand hygiene and appropriate measures for infection prevention was taken. After the risks, benefits and alternatives of the procedure were thoroughly explained, Informed was verified, confirmed and timeout was successfully executed by the treatment team. With the patient in left semi-prone position, medications were administered intravenously.The    was passed from the mouth into the esophagus and further advanced from the esophagus into the stomach. From stomach scope was directed to the second portion of the duodenum. Major papilla was aligned with the duodenoscope. The scope position was confirmed fluoroscopically. Rest of the findings/therapeutics are given below. The scope was then completely withdrawn from the patient and the procedure completed. The pulse, BP, and O2 saturation were monitored and documented by the  physician and the nursing staff throughout the entire procedure. The patient was cared for as planned according to standard protocol. The patient was then discharged to recovery in stable condition and with appropriate post procedure care. Estimated blood loss is zero unless otherwise noted in this procedure report.  The ampulla was located the second portion of the duodenum adjacent to a large diverticulum.  The ampulla was cannulated, but the guidewire was not able to advance into the CBD with multiple positionings.  The PD was never cannulated.  The ampulla was engaged far enough to allow for a free sphincterotomy.  In the full bow position the guidewire was able to be advanced and secured in the right intrahepatic ducts.  Contrast injection revealed a CBD the size of 7 mm and normal intrahepatic ducts.  Slowly extravasation of the contrast was noted around the percutaneous drain.  This may be a representative of a cystic duct leak.  An 8.5 Fr x 5 cm plastic biliary stent was selected.  I was not comfortable with increasing the size of the sphincterotomy so close to the diverticulum.  Excellent drainage was achieved. Fluoroscopic images were obtained.    ADVERSE EVENT:     There were no complications. IMPRESSIONS:     1) Probable cystic duct leak s/p successfuly biliary stent placement. 2) Periampullary diverticulum.  RECOMMENDATIONS:     1) Routine post-operative care per Surgery. 2) Plastic stent exchange or removal in 3 months. REPEAT EXAM:   ___________________________________ [name], MD eSigned:  [name], MD 03/10/2015  5:38 PM   cc:  CPT CODES: ICD9 CODES:  The ICD and CPT codes recommended by this software are interpretations from the data that the clinical staff has captured with the software.  The verification of the translation of this report to the ICD and CPT codes and modifiers is the sole responsibility of the health care institution and  practicing physician where this report was generated.  [employer] will not be held responsible for the validity of the ICD and CPT codes included on this report.  AMA assumes no liability for data contained or not contained herein. CPT is a Publishing rights manager of the [employer].   PATIENT NAME:  Larry [name], Larry [name] MR#: [id number]

## 2015-03-10 NOTE — ED Notes (Signed)
Dr Elnoria Howard at bedside, consent obtained. Pt NPO since last night  Endo will come get pt soon.

## 2015-03-10 NOTE — Anesthesia Preprocedure Evaluation (Addendum)
Anesthesia Evaluation  Patient identified by MRN, date of birth, ID band Patient awake    Reviewed: Allergy & Precautions, NPO status , Patient's Chart, lab work & pertinent test results  History of Anesthesia Complications Negative for: history of anesthetic complications  Airway Mallampati: II  TM Distance: >3 FB Neck ROM: Full    Dental  (+) Dental Advisory Given, Chipped Upper front seemed chipped:   Pulmonary shortness of breath and with exertion,    Pulmonary exam normal breath sounds clear to auscultation       Cardiovascular hypertension, + CAD and + Past MI  Normal cardiovascular exam+ dysrhythmias Atrial Fibrillation  Rhythm:Regular Rate:Normal     Neuro/Psych negative neurological ROS     GI/Hepatic negative GI ROS,   Endo/Other  diabetes, Well Controlled, Type 1, Insulin DependentMorbid obesity  Renal/GU ARFRenal diseaseAKI/  ARF. CRT 1.67 - 2.0. BUN was 70 but OK now     Musculoskeletal   Abdominal (+) + obese,   Peds  Hematology negative hematology ROS (+) anemia , hgb 10   Anesthesia Other Findings   Reproductive/Obstetrics                          Anesthesia Physical Anesthesia Plan  ASA: III  Anesthesia Plan: General   Post-op Pain Management:    Induction: Intravenous  Airway Management Planned: Oral ETT  Additional Equipment:   Intra-op Plan:   Post-operative Plan: Extubation in OR  Informed Consent:   Plan Discussed with: Surgeon  Anesthesia Plan Comments:         Anesthesia Quick Evaluation
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
## 2015-03-10 NOTE — Anesthesia Postprocedure Evaluation (Signed)
  Anesthesia Post-op Note  Patient: Larry [name]  Procedure(s) Performed: Procedure(s) (LRB): ENDOSCOPIC RETROGRADE CHOLANGIOPANCREATOGRAPHY (ERCP) (N/A)  Patient Location: PACU  Anesthesia Type: General  Level of Consciousness: awake and alert   Airway and Oxygen Therapy: Patient Spontanous Breathing  Post-op Pain: mild  Post-op Assessment: Post-op Vital signs reviewed, Patient's Cardiovascular Status Stable, Respiratory Function Stable, Patent Airway and No signs of Nausea or vomiting  Last Vitals:  Filed Vitals:   03/10/15 1852  BP: 153/78  Pulse: 96  Temp: 36.7 C  Resp: 19    Post-op Vital Signs: stable   Complications: No apparent anesthesia complications

## 2015-03-10 NOTE — ED Notes (Signed)
rn will call report to floor. Pt will go up to floor from OR.

## 2015-03-11 ENCOUNTER — Inpatient Hospital Stay (HOSPITAL_COMMUNITY): Payer: Medicare Other

## 2015-03-11 ENCOUNTER — Encounter (HOSPITAL_COMMUNITY): Payer: Self-pay | Admitting: Gastroenterology

## 2015-03-11 DIAGNOSIS — K839 Disease of biliary tract, unspecified: Secondary | ICD-10-CM | POA: Diagnosis present

## 2015-03-11 DIAGNOSIS — E440 Moderate protein-calorie malnutrition: Secondary | ICD-10-CM | POA: Diagnosis present

## 2015-03-11 DIAGNOSIS — I2510 Atherosclerotic heart disease of native coronary artery without angina pectoris: Secondary | ICD-10-CM

## 2015-03-11 DIAGNOSIS — T8143XA Infection following a procedure, organ and space surgical site, initial encounter: Secondary | ICD-10-CM | POA: Insufficient documentation

## 2015-03-11 DIAGNOSIS — I480 Paroxysmal atrial fibrillation: Secondary | ICD-10-CM

## 2015-03-11 DIAGNOSIS — R609 Edema, unspecified: Secondary | ICD-10-CM | POA: Diagnosis present

## 2015-03-11 DIAGNOSIS — T8149XA Infection following a procedure, other surgical site, initial encounter: Secondary | ICD-10-CM

## 2015-03-11 LAB — COMPREHENSIVE METABOLIC PANEL
ALBUMIN: 1.9 g/dL — AB (ref 3.5–5.0)
ALK PHOS: 79 U/L (ref 38–126)
ALT: 11 U/L — AB (ref 17–63)
AST: 23 U/L (ref 15–41)
Anion gap: 6 (ref 5–15)
BILIRUBIN TOTAL: 0.4 mg/dL (ref 0.3–1.2)
BUN: 15 mg/dL (ref 6–20)
CO2: 25 mmol/L (ref 22–32)
CREATININE: 1.47 mg/dL — AB (ref 0.61–1.24)
Calcium: 7.4 mg/dL — ABNORMAL LOW (ref 8.9–10.3)
Chloride: 109 mmol/L (ref 101–111)
GFR calc Af Amer: 55 mL/min — ABNORMAL LOW (ref >60)
GFR, EST NON AFRICAN AMERICAN: 47 mL/min — AB (ref >60)
GLUCOSE: 92 mg/dL (ref 65–99)
Potassium: 3.3 mmol/L — ABNORMAL LOW (ref 3.5–5.1)
Sodium: 140 mmol/L (ref 135–145)
TOTAL PROTEIN: 5.3 g/dL — AB (ref 6.5–8.1)

## 2015-03-11 LAB — GLUCOSE, CAPILLARY
GLUCOSE-CAPILLARY: 79 mg/dL (ref 65–99)
GLUCOSE-CAPILLARY: 99 mg/dL (ref 65–99)
GLUCOSE-CAPILLARY: 66 mg/dL (ref 65–99)
GLUCOSE-CAPILLARY: 77 mg/dL (ref 65–99)
GLUCOSE-CAPILLARY: 79 mg/dL (ref 65–99)
GLUCOSE-CAPILLARY: 89 mg/dL (ref 65–99)
Glucose-Capillary: 90 mg/dL (ref 65–99)
Glucose-Capillary: 86 mg/dL (ref 65–99)

## 2015-03-11 LAB — CBC
HCT: 28.7 % — ABNORMAL LOW (ref 39.0–52.0)
Hemoglobin: 8.9 g/dL — ABNORMAL LOW (ref 13.0–17.0)
MCH: 30.3 pg (ref 26.0–34.0)
MCHC: 31 g/dL (ref 30.0–36.0)
MCV: 97.6 fL (ref 78.0–100.0)
PLATELETS: 201 10*3/uL (ref 150–400)
RBC: 2.94 MIL/uL — ABNORMAL LOW (ref 4.22–5.81)
RDW: 21 % — AB (ref 11.5–15.5)
WBC: 6.4 10*3/uL (ref 4.0–10.5)

## 2015-03-11 LAB — MAGNESIUM: Magnesium: 1.2 mg/dL — ABNORMAL LOW (ref 1.7–2.4)

## 2015-03-11 MED ORDER — ZINC SULFATE 220 (50 ZN) MG PO CAPS
220.0000 mg | ORAL_CAPSULE | Freq: Every morning | ORAL | Status: DC
  Administered 2015-03-11 – 2015-03-14 (×4): 220 mg via ORAL
  Filled 2015-03-11 (×4): qty 1

## 2015-03-11 MED ORDER — HEPARIN SODIUM (PORCINE) 5000 UNIT/ML IJ SOLN
5000.0000 [IU] | Freq: Once | INTRAMUSCULAR | Status: AC
  Administered 2015-03-11: 5000 [IU] via SUBCUTANEOUS
  Filled 2015-03-11: qty 1

## 2015-03-11 MED ORDER — INSULIN ASPART 100 UNIT/ML ~~LOC~~ SOLN
0.0000 [IU] | SUBCUTANEOUS | Status: DC
  Administered 2015-03-12: 2 [IU] via SUBCUTANEOUS
  Administered 2015-03-13: 3 [IU] via SUBCUTANEOUS
  Administered 2015-03-13: 2 [IU] via SUBCUTANEOUS

## 2015-03-11 MED ORDER — MAGNESIUM SULFATE 4 GM/100ML IV SOLN
4.0000 g | Freq: Once | INTRAVENOUS | Status: AC
  Administered 2015-03-11: 4 g via INTRAVENOUS
  Filled 2015-03-11: qty 100

## 2015-03-11 MED ORDER — VITAMIN C 500 MG PO TABS
500.0000 mg | ORAL_TABLET | Freq: Two times a day (BID) | ORAL | Status: DC
  Administered 2015-03-11 – 2015-03-14 (×6): 500 mg via ORAL
  Filled 2015-03-11 (×6): qty 1

## 2015-03-11 MED ORDER — TAB-A-VITE/IRON PO TABS
1.0000 | ORAL_TABLET | Freq: Every day | ORAL | Status: DC
  Administered 2015-03-11 – 2015-03-13 (×3): 1 via ORAL
  Filled 2015-03-11 (×4): qty 1

## 2015-03-11 MED ORDER — ENOXAPARIN SODIUM 60 MG/0.6ML ~~LOC~~ SOLN: 0.5000 mg/kg | SUBCUTANEOUS | Status: DC

## 2015-03-11 MED ORDER — POTASSIUM CHLORIDE CRYS ER 20 MEQ PO TBCR
40.0000 meq | EXTENDED_RELEASE_TABLET | Freq: Once | ORAL | Status: AC
  Administered 2015-03-11: 40 meq via ORAL
  Filled 2015-03-11: qty 2

## 2015-03-11 MED ORDER — SODIUM CHLORIDE 0.9 % IJ SOLN
10.0000 mL | INTRAMUSCULAR | Status: DC | PRN
  Administered 2015-03-11: 10 mL
  Administered 2015-03-11: 20 mL
  Administered 2015-03-13 – 2015-03-14 (×3): 10 mL
  Filled 2015-03-11 (×4): qty 40

## 2015-03-11 MED ORDER — IOHEXOL 300 MG/ML  SOLN
25.0000 mL | INTRAMUSCULAR | Status: AC
  Administered 2015-03-11 (×2): 25 mL via ORAL

## 2015-03-11 MED ORDER — DOUBLE ANTIBIOTIC 500-10000 UNIT/GM EX OINT
TOPICAL_OINTMENT | Freq: Two times a day (BID) | CUTANEOUS | Status: DC
  Administered 2015-03-11 – 2015-03-12 (×2): via TOPICAL
  Administered 2015-03-12: 1 via TOPICAL
  Administered 2015-03-13 – 2015-03-14 (×3): via TOPICAL
  Filled 2015-03-11 (×17): qty 1

## 2015-03-11 NOTE — Progress Notes (Signed)
OT Cancellation Note  Patient Details Name: Larry [name] MRN: [id number] DOB: [birth year]   Cancelled Treatment:    Reason Eval/Treat Not Completed: Patient at procedure or test/ unavailable. Will check back over weekend if schedule permits.  SPENCER,MARYELLEN 03/11/2015, 3:30 PM  Marica Otter, OTR/L [phone] 03/11/2015

## 2015-03-11 NOTE — Progress Notes (Signed)
1 Day Post-Op  Subjective: He says he doesn't hurt.  Drain looks like it has clots in it. Has not eaten yet, but no abdominal pain.  Objective: Vital signs in last 24 hours: Temp:  [97.9 F (36.6 C)-98.4 F (36.9 C)] 97.9 F (36.6 C) (09/23 0350) Pulse Rate:  [78-105] 93 (09/23 0350) Resp:  [11-25] 20 (09/23 0350) BP: (102-153)/(52-87) 106/55 mmHg (09/23 0350) SpO2:  [94 %-100 %] 99 % (09/23 0350) Weight:  [107.9 kg (237 lb 14 oz)-108 kg (238 lb 1.6 oz)] 108 kg (238 lb 1.6 oz) (09/23 0727)  Only 175 CC urine recorded? 200 from JP drain also 1156 IV fluid recorded Diet: clears Afebrile, VSS K+3.3, Creatinine is 1.47, better than admit LFT's OK H/H is down I will check urine culture today Intake/Output from previous day: 09/22 0701 - 09/23 0700 In: 1331.7 [I.V.:1156.7; IV Piggyback:50] Out: 175 [Urine:175] Intake/Output this shift:    General appearance: alert, cooperative and no distress Resp: clear to auscultation bilaterally GI: soft, non-tender; bowel sounds normal; no masses,  no organomegaly and draining less, about 200 last shift, it looks like it has clots in it now that I can see it compressed.  Lab Results:   Recent Labs  03/10/15 1333 03/11/15 0525  WBC 7.0 6.4  HGB 10.0* 8.9*  HCT 31.8* 28.7*  PLT 219 201    BMET  Recent Labs  03/10/15 1333 03/11/15 0525  NA 141 140  K 3.7 3.3*  CL 107 109  CO2 23 25  GLUCOSE 105* 92  BUN 15 15  CREATININE 1.67* 1.47*  CALCIUM 7.7* 7.4*   PT/INR  Recent Labs  03/10/15 1333  LABPROT 17.2*  INR 1.39     Recent Labs Lab 03/10/15 1333 03/11/15 0525  AST 32 23  ALT 12* 11*  ALKPHOS 83 79  BILITOT 0.7 0.4  PROT 6.1* 5.3*  ALBUMIN 2.3* 1.9*     Lipase     Component Value Date/Time   LIPASE 32 03/10/2015 1333     Studies/Results: Nm Hepatobiliary Including Gb  03/10/2015   CLINICAL DATA:  Laparoscopic cholecystectomy 01/04/2015. Infected hematoma gallbladder fossa. Evaluate for bile leak.  Surgical drain in place.  EXAM: NUCLEAR MEDICINE HEPATOBILIARY IMAGING  TECHNIQUE: Sequential images of the abdomen were obtained out to 60 minutes following intravenous administration of radiopharmaceutical.  RADIOPHARMACEUTICALS:  5.3 mCi Tc-65m  Choletec IV  COMPARISON:  CT 02/16/2015.  FINDINGS: The surgical drain was capped for the procedure. Initial images demonstrate homogeneous hepatic activity and prompt visualization of the biliary system. There is rapid accumulation of the radiopharmaceutical within the subhepatic fluid collection. Activity extends into the right pericolic gutter. This follows the distribution of fluid on prior CT. Activity also extends into the bowel.  IMPRESSION: Study is positive for a bile leak with rapid accumulation of activity in the fluid collections in the subhepatic space and right paracolic gutter. No evidence of biliary obstruction.   Electronically Signed   By: Carey Bullocks M.D.   On: 03/10/2015 09:22   Dg Ercp Biliary & Pancreatic Ducts  03/10/2015   CLINICAL DATA:  69 year old male with potential common bile duct stones.  EXAM: ERCP  TECHNIQUE: Multiple spot images obtained with the fluoroscopic device and submitted for interpretation post-procedure.  FLUOROSCOPY TIME:  If the device does not provide the exposure index:  Fluoroscopy Time:  1.18 minutes  Number of Acquired Images:  4  COMPARISON:  CT the abdomen and pelvis 02/16/2015.  FINDINGS: Four images of the  ERCP are provided for evaluation. Initial image demonstrates a pigtail drainage catheter in the right upper quadrant of the abdomen, presumably within the gallbladder fossa. Endoscope in position with cannulation of the Sphincter of Oddi and the guidewire extending into the distal common bile duct. Second image demonstrates similar findings. Third image demonstrates advancement of a guidewire into the intrahepatic biliary tree, and apparent extension of the guidewire into the region of the gallbladder fossa  coming in close proximity to the indwelling catheter. Notably, injection at this time demonstrates opacification of the common bile duct and a portion of the intrahepatic biliary tree (common bile duct is poorly opacified), and there appears to be a extravasation of contrast material into the gallbladder fossa around the pigtail drainage catheter. The fourth image demonstrates placement of a common bile duct stent which appears appropriately located, withdrawal of the endoscope, and persistence of contrast material within the gallbladder fossa.  IMPRESSION: 1. ERCP images document placement of CBD stent, as above. 2. Extravasation of contrast material into the gallbladder fossa, presumably within a persistent biloma. 3. Images provided were insufficient to accurately assess for the presence or absence of common bile duct stones. Please refer to procedural note from ERCP for further details. These images were submitted for radiologic interpretation only. Please see the procedural report for the amount of contrast and the fluoroscopy time utilized.   Electronically Signed   By: Trudie Reed M.D.   On: 03/10/2015 17:41    Medications: . antiseptic oral rinse  7 mL Mouth Rinse q12n4p  . atorvastatin  80 mg Oral QHS  . cefTRIAXone (ROCEPHIN) IVPB 2 gram/50 mL D5W (Pyxis)  2 g Intravenous Q24H  . feeding supplement (ENSURE ENLIVE)  237 mL Oral BID BM  . feeding supplement (PRO-STAT SUGAR FREE 64)  30 mL Oral BID  . furosemide  40 mg Oral BID  . insulin aspart  0-15 Units Subcutaneous 6 times per day  . insulin aspart  0-9 Units Subcutaneous TID WC  . lactobacillus acidophilus & bulgar  1 tablet Oral BID  . LORazepam  0.5 mg Oral QHS  . magnesium sulfate 1 - 4 g bolus IVPB  4 g Intravenous Once  . metroNIDAZOLE  500 mg Oral 4 times per day  . pantoprazole  40 mg Oral QAC breakfast  . potassium chloride  40 mEq Oral Once  . vitamin C  500 mg Oral BID  . zinc sulfate  220 mg Oral q morning - 10a     Assessment/Plan Bile leak s/p Cholecystectomy  Hx of sepsis and acute cholecystitis with IR drain placement 10/11/14. Drain removed and elective cholecystectomy on 12/30/14 Readmitted with sepsis, infected hematoma/abscess cholecystectomy, sepsis, ARF,and VDRF, 8/3 - 02/26/15  New bile leak on HIDA, 03/10/15 Hx of MI Hx of AF AODM Hx hypertension Hx of obesity Body mass index is 37.28 Antibiotics:  Rocephin/Flagyl, day 2 DVT:  Lovenox/SCD  PLan:  I will get IR to look at the drain and irrigate as needed. IV just changed hid PICC dressings and said it had not been done for some time.  Recheck labs in AM.  If he does OK with clears advance his diet to low carb later today.  GET OT/PT involved, along with case management.  No one is happy with care at current SNF.  Maybe we can try to get him home from here as opposed to going back to SNF.  Cardiology should see him this AM also.   Labs in AM.  LOS: 1 day    JENNINGS,WILLARD 03/11/2015

## 2015-03-11 NOTE — Progress Notes (Signed)
Pt had 8 beat run SVT. Currently in NSR. Text page sent to Dr. [name] informing him of same. Delford Field, RN
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
## 2015-03-11 NOTE — Evaluation (Signed)
Physical Therapy Evaluation Patient Details Name: Larry [name] MRN: [id number] DOB: [birth year] Today's Date: 03/11/2015   History of Present Illness  69 year old male with very long and complex history with his acute cholecystitis starting 10/06/2014 including multiple hospitalizations admiited due to new bile leak and had recent ERCP with biliary stent placement by GI 9/22   Clinical Impression  Pt admitted with above diagnosis. Pt currently with functional limitations due to the deficits listed below (see PT Problem List).   Pt will benefit from skilled PT to increase their independence and safety with mobility to allow discharge to the venue listed below.  Pt mobilizing well and has good safety awareness.  Pt does not wish to return to SNF however feel pt would be very appropriate to d/c home from hospital at this time (based on mobility).     Follow Up Recommendations Home health PT    Equipment Recommendations  None recommended by PT    Recommendations for Other Services       Precautions / Restrictions Precautions Precautions: Fall Precaution Comments: JP drain on R, monitor HR Restrictions Weight Bearing Restrictions: No      Mobility  Bed Mobility Overal bed mobility: Needs Assistance Bed Mobility: Supine to Sit     Supine to sit: Min guard;HOB elevated        Transfers Overall transfer level: Needs assistance Equipment used: Rolling walker (2 wheeled) Transfers: Sit to/from Stand Sit to Stand: Min guard         General transfer comment: pt demonstrates good safety techniques  Ambulation/Gait Ambulation/Gait assistance: Min guard Ambulation Distance (Feet): 240 Feet Assistive device: Rolling walker (2 wheeled) Gait Pattern/deviations: Step-through pattern;Decreased stride length;Trunk flexed Gait velocity: decreased   General Gait Details: keeps RW a little too far forward however steady gait, fatigues quickly requiring 3 seated rest breaks, HR  116-127 bpm during gait  Stairs            Wheelchair Mobility    Modified Rankin (Stroke Patients Only)       Balance                                             Pertinent Vitals/Pain Pain Assessment: No/denies pain    Home Living Family/patient expects to be discharged to:: Private residence Living Arrangements: Spouse/significant other Available Help at Discharge: Family Type of Home: House Home Access: Stairs to enter   Secretary/administrator of Steps: 2 Home Layout: One level Home Equipment: Environmental consultant - 2 wheels      Prior Function Level of Independence: Needs assistance   Gait / Transfers Assistance Needed: ambulatory with RW     Comments: admitted from SNF (rehab), wishes to d/c home     Hand Dominance        Extremity/Trunk Assessment   Upper Extremity Assessment: Defer to OT evaluation           Lower Extremity Assessment: Generalized weakness      Cervical / Trunk Assessment: Normal  Communication   Communication: No difficulties  Cognition Arousal/Alertness: Awake/alert Behavior During Therapy: WFL for tasks assessed/performed Overall Cognitive Status: Within Functional Limits for tasks assessed                      General Comments  increased pedal edema bilaterally    Exercises  Assessment/Plan    PT Assessment Patient needs continued PT services  PT Diagnosis Difficulty walking;Generalized weakness   PT Problem List Decreased strength;Decreased activity tolerance;Decreased mobility;Obesity  PT Treatment Interventions DME instruction;Gait training;Functional mobility training;Therapeutic activities;Patient/family education;Therapeutic exercise;Stair training   PT Goals (Current goals can be found in the Care Plan section) Acute Rehab PT Goals Patient Stated Goal: discharge home, does not wish to go back to SNF PT Goal Formulation: With patient Time For Goal Achievement:  03/25/15 Potential to Achieve Goals: Good    Frequency Min 3X/week   Barriers to discharge        Co-evaluation               End of Session   Activity Tolerance: Patient tolerated treatment well Patient left: with call bell/phone within reach;in chair           Time: 0921-1000 PT Time Calculation (min) (ACUTE ONLY): 39 min   Charges:   PT Evaluation $Initial PT Evaluation Tier I: 1 Procedure PT Treatments $Gait Training: 8-22 mins   PT G Codes:        Mancil Pfenning,KATHrine E 03/11/2015, 12:10 PM Zenovia Jarred, PT, DPT 03/11/2015 Pager: [phone]

## 2015-03-11 NOTE — Progress Notes (Signed)
PHARMACY CONSULT: Lovenox for VTE prophylaxis   Wt: 108 kg BMI:  37 Scr:  1.47 CrCl >30 ml/hr  H/H: 8.9/28.7 Pltc: 201  A/P:  Due to morbid obesity, begin weight-adjusted lovenox 0.5mg /kg sq q24h.  Will start this evening per surgery recommendation.  S/p ERCP yesterday evening.  Pharmacy will sign off at this time but will adjust Lovenox dose if SCr changes.  Clance Boll, PharmD, BCPS Pager: [phone] 03/11/2015 9:07 AM

## 2015-03-11 NOTE — Progress Notes (Signed)
Patient ID: Larry [name], male   DOB: 1945-10-10, 69 y.o.   MRN: [id number]    Referring Physician(s): CCS  Chief Complaint:  Gallbladder fossa abscess, bile leak  Subjective:  Pt doing ok today; no new c/o; had recent ERCP with biliary stent placement by GI 9/22 for new bile leak at cystic duct; GB fossa drain intact  Allergies: Review of patient's allergies indicates no known allergies.  Medications: Prior to Admission medications   Medication Sig Start Date End Date Taking? Authorizing Provider  acetaminophen (TYLENOL) 325 MG tablet Take 2 tablets (650 mg total) by mouth every 6 (six) hours as needed for mild pain, moderate pain, fever or headache. Patient taking differently: Take 650 mg by mouth every 6 (six) hours as needed (For pain or fever.).  02/26/15  Yes Elease Etienne, MD  antiseptic oral rinse (CPC / CETYLPYRIDINIUM CHLORIDE 0.05%) 0.05 % LIQD solution 7 mLs by Mouth Rinse route 2 times daily at 12 noon and 4 pm. 02/26/15  Yes Elease Etienne, MD  atorvastatin (LIPITOR) 80 MG tablet Take 80 mg by mouth at bedtime.    Yes Historical Provider, MD  cefTRIAXone 2 g in dextrose 5 % 50 mL Inject 2 g into the vein daily. For 4 weeks from 02/25/2015. 02/26/15 03/24/15 Yes Elease Etienne, MD  chlorhexidine (PERIDEX) 0.12 % solution 15 mLs by Mouth Rinse route 2 (two) times daily. 02/26/15  Yes Elease Etienne, MD  feeding supplement, ENSURE ENLIVE, (ENSURE ENLIVE) LIQD Take 237 mLs by mouth 2 (two) times daily between meals. 02/26/15  Yes Elease Etienne, MD  furosemide (LASIX) 40 MG tablet Take 40 mg by mouth 2 (two) times daily.   Yes Historical Provider, MD  furosemide (LASIX) 40 MG tablet Take 40 mg by mouth daily.   Yes Historical Provider, MD  glucose 4 GM chewable tablet Chew 1 tablet by mouth as needed for low blood sugar (only if BS IS BELOW 70).   Yes Historical Provider, MD  insulin aspart (NOVOLOG) 100 UNIT/ML injection Inject 0-9 Units into the skin 3 (three) times  daily with meals. CBG < 70: implement hypoglycemia protocol CBG 70 - 120: 0 units CBG 121 - 150: 1 unit CBG 151 - 200: 2 units CBG 201 - 250: 3 units CBG 251 - 300: 5 units CBG 301 - 350: 7 units CBG 351 - 400: 9 units CBG > 400: call MD. 02/26/15  Yes Elease Etienne, MD  insulin glargine (LANTUS) 100 UNIT/ML injection Inject 0.15 mLs (15 Units total) into the skin daily. 02/26/15  Yes Elease Etienne, MD  lactobacillus acidophilus (BACID) TABS tablet Take 1 tablet by mouth 2 (two) times daily.   Yes Historical Provider, MD  levalbuterol (XOPENEX) 0.63 MG/3ML nebulizer solution Take 3 mLs (0.63 mg total) by nebulization every 6 (six) hours as needed for wheezing or shortness of breath. 02/26/15  Yes Elease Etienne, MD  LORazepam (ATIVAN) 0.5 MG tablet Take 0.5 mg by mouth at bedtime.   Yes Historical Provider, MD  LORazepam (ATIVAN) 0.5 MG tablet Take 0.5 mg by mouth every 8 (eight) hours as needed for anxiety.   Yes Historical Provider, MD  magnesium oxide (MAG-OX) 400 MG tablet Take 400 mg by mouth 2 (two) times daily.    Yes Historical Provider, MD  Melatonin 3 MG TABS Take 3 mg by mouth at bedtime.   Yes Historical Provider, MD  metroNIDAZOLE (FLAGYL) 500 MG tablet Take 1 tablet (500 mg total)  by mouth every 6 (six) hours. For 4 weeks starting 02/25/2015. 02/26/15 03/24/15 Yes Elease Etienne, MD  pantoprazole (PROTONIX) 40 MG tablet Take 40 mg by mouth daily before breakfast.    Yes Historical Provider, MD  potassium chloride SA (K-DUR,KLOR-CON) 20 MEQ tablet Take 20 mEq by mouth once.   Yes Historical Provider, MD  promethazine (PHENERGAN) 25 MG suppository Place 25 mg rectally every 6 (six) hours as needed for nausea or vomiting.   Yes Historical Provider, MD  promethazine (PHENERGAN) 25 MG tablet Take 25 mg by mouth every 6 (six) hours as needed for nausea or vomiting.   Yes Historical Provider, MD  promethazine (PHENERGAN) 25 MG/ML injection Inject 25 mg into the muscle every 6 (six)  hours as needed for nausea or vomiting.   Yes Historical Provider, MD  protein supplement (PROSOURCE NO CARB) LIQD 30 mLs by Per post-pyloric tube route 2 (two) times daily.   Yes Historical Provider, MD  traMADol (ULTRAM) 50 MG tablet Take 50 mg by mouth every 6 (six) hours as needed (For pain.).   Yes Historical Provider, MD  vitamin C (ASCORBIC ACID) 500 MG tablet Take 500 mg by mouth 2 (two) times daily.   Yes Historical Provider, MD  zinc sulfate 220 MG capsule Take 220 mg by mouth every morning.   Yes Historical Provider, MD     Vital Signs: BP 106/55 mmHg  Pulse 93  Temp(Src) 97.9 F (36.6 C) (Oral)  Resp 20  Ht  (1.702 m)  Wt 238 lb 1.6 oz (108 kg)  BMI 37.28 kg/m2  SpO2 99%  Physical Exam awake/alert; GB fossa drain intact, insertion site ok, mildly tender; about 25 cc's turbid, blood tinged fluid in JP bulb; drain irrigated with 10 cc sterile NS without difficulty; new JP bulb/connecting tube and adhesive dressing applied to site  Imaging: Nm Hepatobiliary Including Gb  03/10/2015   CLINICAL DATA:  Laparoscopic cholecystectomy 01/04/2015. Infected hematoma gallbladder fossa. Evaluate for bile leak. Surgical drain in place.  EXAM: NUCLEAR MEDICINE HEPATOBILIARY IMAGING  TECHNIQUE: Sequential images of the abdomen were obtained out to 60 minutes following intravenous administration of radiopharmaceutical.  RADIOPHARMACEUTICALS:  5.3 mCi Tc-40m  Choletec IV  COMPARISON:  CT 02/16/2015.  FINDINGS: The surgical drain was capped for the procedure. Initial images demonstrate homogeneous hepatic activity and prompt visualization of the biliary system. There is rapid accumulation of the radiopharmaceutical within the subhepatic fluid collection. Activity extends into the right pericolic gutter. This follows the distribution of fluid on prior CT. Activity also extends into the bowel.  IMPRESSION: Study is positive for a bile leak with rapid accumulation of activity in the fluid  collections in the subhepatic space and right paracolic gutter. No evidence of biliary obstruction.   Electronically Signed   By: Carey Bullocks M.D.   On: 03/10/2015 09:22   Dg Ercp Biliary & Pancreatic Ducts  03/10/2015   CLINICAL DATA:  69 year old male with potential common bile duct stones.  EXAM: ERCP  TECHNIQUE: Multiple spot images obtained with the fluoroscopic device and submitted for interpretation post-procedure.  FLUOROSCOPY TIME:  If the device does not provide the exposure index:  Fluoroscopy Time:  1.18 minutes  Number of Acquired Images:  4  COMPARISON:  CT the abdomen and pelvis 02/16/2015.  FINDINGS: Four images of the ERCP are provided for evaluation. Initial image demonstrates a pigtail drainage catheter in the right upper quadrant of the abdomen, presumably within the gallbladder fossa. Endoscope in position with cannulation  of the Sphincter of Oddi and the guidewire extending into the distal common bile duct. Second image demonstrates similar findings. Third image demonstrates advancement of a guidewire into the intrahepatic biliary tree, and apparent extension of the guidewire into the region of the gallbladder fossa coming in close proximity to the indwelling catheter. Notably, injection at this time demonstrates opacification of the common bile duct and a portion of the intrahepatic biliary tree (common bile duct is poorly opacified), and there appears to be a extravasation of contrast material into the gallbladder fossa around the pigtail drainage catheter. The fourth image demonstrates placement of a common bile duct stent which appears appropriately located, withdrawal of the endoscope, and persistence of contrast material within the gallbladder fossa.  IMPRESSION: 1. ERCP images document placement of CBD stent, as above. 2. Extravasation of contrast material into the gallbladder fossa, presumably within a persistent biloma. 3. Images provided were insufficient to accurately assess  for the presence or absence of common bile duct stones. Please refer to procedural note from ERCP for further details. These images were submitted for radiologic interpretation only. Please see the procedural report for the amount of contrast and the fluoroscopy time utilized.   Electronically Signed   By: Trudie Reed M.D.   On: 03/10/2015 17:41    Labs:  CBC:  Recent Labs  02/25/15 0840 02/26/15 0455 03/10/15 1333 03/11/15 0525  WBC 5.5 4.1 7.0 6.4  HGB 9.8* 9.1* 10.0* 8.9*  HCT 30.3* 27.9* 31.8* 28.7*  PLT 401* 391 219 201    COAGS:  Recent Labs  10/06/14 0445 01/19/15 1613 01/23/15 0754  02/20/15 0450 02/21/15 0555 02/22/15 0515 02/23/15 0550 03/10/15 1333  INR 1.07 1.46 1.32  --   --   --   --   --  1.39  APTT 34 35 38*  < > 37 31 27 29   --   < > = values in this interval not displayed.  BMP:  Recent Labs  02/25/15 0840 02/26/15 0455 03/10/15 1333 03/11/15 0525  NA 143 143 141 140  K 3.6 4.6 3.7 3.3*  CL 116* 117* 107 109  CO2 20* 21* 23 25  GLUCOSE 101* 178* 105* 92  BUN 77* 70* 15 15  CALCIUM 8.2* 8.0* 7.7* 7.4*  CREATININE 2.11* 2.01* 1.67* 1.47*  GFRNONAA 31* 32* 40* 47*  GFRAA 35* 38* 47* 55*    LIVER FUNCTION TESTS:  Recent Labs  02/21/15 0555  02/23/15 0550 02/24/15 0455 03/10/15 1333 03/11/15 0525  BILITOT 1.2  --   --  0.9 0.7 0.4  AST 29  --   --  27 32 23  ALT 18  --   --  25 12* 11*  ALKPHOS 113  --   --  114 83 79  PROT 6.1*  --   --  6.1* 6.1* 5.3*  ALBUMIN 2.0*  < > 2.0* 2.0* 2.3* 1.9*  < > = values in this interval not displayed.  Assessment and Plan:   S/p GB fossa abscess/hematoma drainage 8/4, upsized 8/18 as well as TPA dwells into drain 8/25-26; now with finding of bile leak on recent NM study, s/p biliary stenting on 9/22; afebrile; WBC 6.4, HGB 8.9, t bili nl, creat 1.47,  cont drain NS flushes/closely monitor output; check drain fluid cx's; other plans as per CCS; last CT 8/31- would recheck soon to reasess  size of abscess  Signed: D. Jeananne Rama 03/11/2015, 9:37 AM   I spent a total of 15 minutes  at the the patient's bedside AND on the patient's hospital floor or unit, greater than 50% of which was counseling/coordinating care for GB fossa abscess drain

## 2015-03-11 NOTE — Progress Notes (Signed)
Initial Nutrition Assessment  DOCUMENTATION CODES:   Non-severe (moderate) malnutrition in context of chronic illness, Obesity unspecified  INTERVENTION:  - Continue Ensure Enlive BID, each supplement provides 350 kcal and 20 grams of protein - Continue Prostat BID, each supplement provides 100 kcal and 15 grams of protein - RD will continue to monitor for needs  NUTRITION DIAGNOSIS:   Inadequate oral intake related to inability to eat as evidenced by NPO status.  GOAL:   Patient will meet greater than or equal to 90% of their needs  MONITOR:   Diet advancement, Weight trends, Labs, I & O's  REASON FOR ASSESSMENT:   Malnutrition Screening Tool  ASSESSMENT:   69 y/o male who was seen and admitted with Sepsis, acute cholecystitis, hypotension, acute renal failure, 10/06/14. He had a drain placed by IR on 10/11/14. He recovered from this and was readmitted to the hospital on 12/30/14 for elective laparoscopic cholecystectomy. He returned on 01/19/15 with abdominal pain, nausea, vomiting, hypotension and fever to the [facility]. CT scan shows fluid around the liver. He was hypotensive and it was found to be an infected hematoma at the cholecystectomy site. He was hospitalized again from 01/18/14-02/26/15 with sepsis from the cholecystectomy site. He has an IR drain place . Complications included sepsis, worsening anemia, and transfusion. Acute renal failure with CRRT, VDRF, hypotension with pressor support, and acute encephalopathy.   Pt seen for MST. BMI indicates obesity. Pt states that the last thing he ate was green beans, watermelon, and cantaloupe on 9/20 night. Since then he has been unable to eat due to ERCP with stent placement yesterday and CT with contrast scheduled for today.   Pt reports that since d/c he has been eating fair; he did not like the food at rehab facility so wife was bringing him food but he was not eating much at any one time. He states nausea at this time related  to drinking contrast for CT.   Most of discussion with pt and his wife, who was at bedside, was based on information they provided about poor quality of care at rehab facility.   Pt previously had prolonged hospitalization and was followed by RDs during that time. No muscle or fat wasting present. Pt has lost 11 lbs (4% body weight) in <1 month which is significant for time frame; lean muscle loss may be hidden by severe edema at this time.   Not able to meet needs. Supplements have already been ordered and will be appropriate with diet advancement. Medications reviewed; 40 mg Lasix BID. Labs reviewed; CBGs: 79-156 mg/dL, K: 3.3 mmol/L, creatinine elevated, Mg: 1.2 mg/dL, Ca: 7.4 mg/dL, GFR: 47.    Diet Order:  Diet clear liquid Room service appropriate?: Yes; Fluid consistency:: Thin  Skin:  Reviewed, no issues  Last BM:  9/22  Height:   Ht Readings from Last 1 Encounters:  03/10/15  (1.702 m)    Weight:   Wt Readings from Last 1 Encounters:  03/11/15 238 lb 1.6 oz (108 kg)    Ideal Body Weight:  67.27 kg (kg)  BMI:  Body mass index is 37.28 kg/(m^2).  Estimated Nutritional Needs:   Kcal:  4098-1191  Protein:  90-100 grams  Fluid:  1.5-1.8 L/day  EDUCATION NEEDS:   No education needs identified at this time     Trenton Gammon, RD, LDN Inpatient Clinical Dietitian Pager # [phone] After hours/weekend pager # [phone]

## 2015-03-11 NOTE — Clinical Documentation Improvement (Signed)
General Surgery  Can the diagnosis of Malnutrition be further specified? Please assess Nutritional Consult on 9/22 and render an opinion in next progress note including the severity of.   Document Severity - Severe(third degree), Moderate (second degree), Mild (first degree)  Other condition  Unable to clinically determine  Document any associated diagnoses/conditions  Supporting Information: :   Being treated with Ensure Enlive BID and Prostat BID  Please exercise your independent, professional judgment when responding. A specific answer is not anticipated or expected.  Thank You, Shellee Milo BSN, RN Health Information Management [place] [phone]

## 2015-03-11 NOTE — Progress Notes (Signed)
Subjective: No complaints.  Objective: Vital signs in last 24 hours: Temp:  [97.9 F (36.6 C)-98.4 F (36.9 C)] 97.9 F (36.6 C) (09/23 0350) Pulse Rate:  [78-105] 93 (09/23 0350) Resp:  [11-25] 20 (09/23 0350) BP: (102-153)/(52-87) 106/55 mmHg (09/23 0350) SpO2:  [94 %-100 %] 99 % (09/23 0350) Weight:  [107.9 kg (237 lb 14 oz)-108 kg (238 lb 1.6 oz)] 108 kg (238 lb 1.6 oz) (09/23 0727)    Intake/Output from previous day: 09/22 0701 - 09/23 0700 In: 1331.7 [I.V.:1156.7; IV Piggyback:50] Out: 175 [Urine:175] Intake/Output this shift:    General appearance: no distress GI: soft, non-tender; bowel sounds normal; no masses,  no organomegaly  Lab Results:  Recent Labs  03/10/15 1333 03/11/15 0525  WBC 7.0 6.4  HGB 10.0* 8.9*  HCT 31.8* 28.7*  PLT 219 201   BMET  Recent Labs  03/10/15 1333 03/11/15 0525  NA 141 140  K 3.7 3.3*  CL 107 109  CO2 23 25  GLUCOSE 105* 92  BUN 15 15  CREATININE 1.67* 1.47*  CALCIUM 7.7* 7.4*   LFT  Recent Labs  03/11/15 0525  PROT 5.3*  ALBUMIN 1.9*  AST 23  ALT 11*  ALKPHOS 79  BILITOT 0.4   PT/INR  Recent Labs  03/10/15 1333  LABPROT 17.2*  INR 1.39   Hepatitis Panel No results for input(s): HEPBSAG, HCVAB, HEPAIGM, HEPBIGM in the last 72 hours. C-Diff No results for input(s): CDIFFTOX in the last 72 hours. Fecal Lactopherrin No results for input(s): FECLLACTOFRN in the last 72 hours.  Studies/Results: Nm Hepatobiliary Including Gb  03/10/2015   CLINICAL DATA:  Laparoscopic cholecystectomy 01/04/2015. Infected hematoma gallbladder fossa. Evaluate for bile leak. Surgical drain in place.  EXAM: NUCLEAR MEDICINE HEPATOBILIARY IMAGING  TECHNIQUE: Sequential images of the abdomen were obtained out to 60 minutes following intravenous administration of radiopharmaceutical.  RADIOPHARMACEUTICALS:  5.3 mCi Tc-11m  Choletec IV  COMPARISON:  CT 02/16/2015.  FINDINGS: The surgical drain was capped for the procedure. Initial  images demonstrate homogeneous hepatic activity and prompt visualization of the biliary system. There is rapid accumulation of the radiopharmaceutical within the subhepatic fluid collection. Activity extends into the right pericolic gutter. This follows the distribution of fluid on prior CT. Activity also extends into the bowel.  IMPRESSION: Study is positive for a bile leak with rapid accumulation of activity in the fluid collections in the subhepatic space and right paracolic gutter. No evidence of biliary obstruction.   Electronically Signed   By: Carey Bullocks M.D.   On: 03/10/2015 09:22   Dg Ercp Biliary & Pancreatic Ducts  03/10/2015   CLINICAL DATA:  68 year old male with potential common bile duct stones.  EXAM: ERCP  TECHNIQUE: Multiple spot images obtained with the fluoroscopic device and submitted for interpretation post-procedure.  FLUOROSCOPY TIME:  If the device does not provide the exposure index:  Fluoroscopy Time:  1.18 minutes  Number of Acquired Images:  4  COMPARISON:  CT the abdomen and pelvis 02/16/2015.  FINDINGS: Four images of the ERCP are provided for evaluation. Initial image demonstrates a pigtail drainage catheter in the right upper quadrant of the abdomen, presumably within the gallbladder fossa. Endoscope in position with cannulation of the Sphincter of Oddi and the guidewire extending into the distal common bile duct. Second image demonstrates similar findings. Third image demonstrates advancement of a guidewire into the intrahepatic biliary tree, and apparent extension of the guidewire into the region of the gallbladder fossa coming in close proximity  to the indwelling catheter. Notably, injection at this time demonstrates opacification of the common bile duct and a portion of the intrahepatic biliary tree (common bile duct is poorly opacified), and there appears to be a extravasation of contrast material into the gallbladder fossa around the pigtail drainage catheter. The  fourth image demonstrates placement of a common bile duct stent which appears appropriately located, withdrawal of the endoscope, and persistence of contrast material within the gallbladder fossa.  IMPRESSION: 1. ERCP images document placement of CBD stent, as above. 2. Extravasation of contrast material into the gallbladder fossa, presumably within a persistent biloma. 3. Images provided were insufficient to accurately assess for the presence or absence of common bile duct stones. Please refer to procedural note from ERCP for further details. These images were submitted for radiologic interpretation only. Please see the procedural report for the amount of contrast and the fluoroscopy time utilized.   Electronically Signed   By: Trudie Reed M.D.   On: 03/10/2015 17:41    Medications:  Scheduled: . antiseptic oral rinse  7 mL Mouth Rinse q12n4p  . atorvastatin  80 mg Oral QHS  . cefTRIAXone (ROCEPHIN) IVPB 2 gram/50 mL D5W (Pyxis)  2 g Intravenous Q24H  . feeding supplement (ENSURE ENLIVE)  237 mL Oral BID BM  . feeding supplement (PRO-STAT SUGAR FREE 64)  30 mL Oral BID  . furosemide  40 mg Oral BID  . insulin aspart  0-15 Units Subcutaneous 6 times per day  . insulin aspart  0-9 Units Subcutaneous TID WC  . lactobacillus acidophilus & bulgar  1 tablet Oral BID  . LORazepam  0.5 mg Oral QHS  . magnesium sulfate 1 - 4 g bolus IVPB  4 g Intravenous Once  . metroNIDAZOLE  500 mg Oral 4 times per day  . pantoprazole  40 mg Oral QAC breakfast  . potassium chloride  40 mEq Oral Once  . vitamin C  500 mg Oral BID  . zinc sulfate  220 mg Oral q morning - 10a   Continuous: . 0.9 % NaCl with KCl 20 mEq / L 50 mL/hr at 03/11/15 0052    Assessment/Plan: 1) Bile leak - ? Cystic duct s/p 8.5 Fr x 5 cm plastic biliary stent placement. 2) Multiple medical problems.   The patient states that the biliary drain was only emptied twice.  No recordings in the I/O of his chart, but he states that he was  emptying the drain every hour at home.  This appears to be an improvement.  Plan: 1) Management per Surgery. 2) Follow up in the office in one month at the time of discharge. 3) Stent removal/exchange in 3 months.   LOS: 1 day   HUNG,PATRICK D 03/11/2015, 8:07 AM

## 2015-03-12 ENCOUNTER — Inpatient Hospital Stay (HOSPITAL_COMMUNITY): Payer: Medicare Other

## 2015-03-12 LAB — COMPREHENSIVE METABOLIC PANEL
ALT: 11 U/L — AB (ref 17–63)
AST: 24 U/L (ref 15–41)
Albumin: 2 g/dL — ABNORMAL LOW (ref 3.5–5.0)
Alkaline Phosphatase: 86 U/L (ref 38–126)
Anion gap: 8 (ref 5–15)
BILIRUBIN TOTAL: 0.7 mg/dL (ref 0.3–1.2)
BUN: 15 mg/dL (ref 6–20)
CO2: 26 mmol/L (ref 22–32)
CREATININE: 1.55 mg/dL — AB (ref 0.61–1.24)
Calcium: 7.7 mg/dL — ABNORMAL LOW (ref 8.9–10.3)
Chloride: 108 mmol/L (ref 101–111)
GFR calc Af Amer: 51 mL/min — ABNORMAL LOW (ref >60)
GFR, EST NON AFRICAN AMERICAN: 44 mL/min — AB (ref >60)
GLUCOSE: 93 mg/dL (ref 65–99)
Potassium: 3.6 mmol/L (ref 3.5–5.1)
Sodium: 142 mmol/L (ref 135–145)
TOTAL PROTEIN: 5.6 g/dL — AB (ref 6.5–8.1)

## 2015-03-12 LAB — GLUCOSE, CAPILLARY
GLUCOSE-CAPILLARY: 107 mg/dL — AB (ref 65–99)
GLUCOSE-CAPILLARY: 109 mg/dL — AB (ref 65–99)
GLUCOSE-CAPILLARY: 122 mg/dL — AB (ref 65–99)
Glucose-Capillary: 83 mg/dL (ref 65–99)
Glucose-Capillary: 78 mg/dL (ref 65–99)
Glucose-Capillary: 96 mg/dL (ref 65–99)

## 2015-03-12 LAB — CBC
HCT: 29.1 % — ABNORMAL LOW (ref 39.0–52.0)
Hemoglobin: 9.2 g/dL — ABNORMAL LOW (ref 13.0–17.0)
MCH: 30.4 pg (ref 26.0–34.0)
MCHC: 31.6 g/dL (ref 30.0–36.0)
MCV: 96 fL (ref 78.0–100.0)
Platelets: 237 10*3/uL (ref 150–400)
RBC: 3.03 MIL/uL — ABNORMAL LOW (ref 4.22–5.81)
RDW: 20.7 % — AB (ref 11.5–15.5)
WBC: 5.3 10*3/uL (ref 4.0–10.5)

## 2015-03-12 LAB — MAGNESIUM: MAGNESIUM: 1.6 mg/dL — AB (ref 1.7–2.4)

## 2015-03-12 MED ORDER — MIDAZOLAM HCL 2 MG/2ML IJ SOLN
INTRAMUSCULAR | Status: AC | PRN
  Administered 2015-03-12: 0.5 mg via INTRAVENOUS

## 2015-03-12 MED ORDER — MIDAZOLAM HCL 2 MG/2ML IJ SOLN
INTRAMUSCULAR | Status: AC
  Filled 2015-03-12: qty 4

## 2015-03-12 MED ORDER — FENTANYL CITRATE (PF) 100 MCG/2ML IJ SOLN
INTRAMUSCULAR | Status: AC | PRN
Start: 2015-03-12 — End: 2015-03-12
  Administered 2015-03-12: 25 ug via INTRAVENOUS

## 2015-03-12 MED ORDER — FENTANYL CITRATE (PF) 100 MCG/2ML IJ SOLN
INTRAMUSCULAR | Status: AC
  Filled 2015-03-12: qty 2

## 2015-03-12 NOTE — Procedures (Signed)
CT-guided aspiration of perihepatic collection.  Only 2 ml of bloody fluid could be aspirated.  Findings compatible with a hematoma.  No immediate complication.  Minimal blood loss.

## 2015-03-12 NOTE — Progress Notes (Signed)
Wasted 1.5mg  Versed and Fentanyl with Dr. Richarda Overlie post procedure.

## 2015-03-12 NOTE — Progress Notes (Signed)
Patient ID: Larry [name], male   DOB: 1946-05-03, 69 y.o.   MRN: [id number]  General Surgery [facility]  Subjective: Patient in bed, wife at bedside.  Just back from radiology.  Wants to eat.  Objective: Vital signs in last 24 hours: Temp:  [98 F (36.7 C)-98.3 F (36.8 C)] 98 F (36.7 C) (09/24 0413) Pulse Rate:  [97-115] 97 (09/24 0946) Resp:  [16-22] 20 (09/24 0946) BP: (94-122)/(56-66) 102/60 mmHg (09/24 0946) SpO2:  [95 %-98 %] 98 % (09/24 0946) Last BM Date: 03/10/15  Intake/Output from previous day: 09/23 0701 - 09/24 0700 In: 1440 [P.O.:720; I.V.:670; IV Piggyback:50] Out: 1720 [Urine:1625; Drains:95] Intake/Output this shift: Total I/O In: -  Out: 25 [Drains:25]  Physical Exam: HEENT - sclerae clear, mucous membranes moist Neck - soft Chest - clear bilaterally Cor - RRR Abdomen - soft, obese; drain RUQ with thin bilious fluid Ext - no edema, non-tender Neuro - alert & oriented, no focal deficits  Lab Results:   Recent Labs  03/11/15 0525 03/12/15 0500  WBC 6.4 5.3  HGB 8.9* 9.2*  HCT 28.7* 29.1*  PLT 201 237   BMET  Recent Labs  03/11/15 0525 03/12/15 0450  NA 140 142  K 3.3* 3.6  CL 109 108  CO2 25 26  GLUCOSE 92 93  BUN 15 15  CREATININE 1.47* 1.55*  CALCIUM 7.4* 7.7*   PT/INR  Recent Labs  03/10/15 1333  LABPROT 17.2*  INR 1.39   Comprehensive Metabolic Panel:    Component Value Date/Time   NA 142 03/12/2015 0450   NA 140 03/11/2015 0525   K 3.6 03/12/2015 0450   K 3.3* 03/11/2015 0525   CL 108 03/12/2015 0450   CL 109 03/11/2015 0525   CO2 26 03/12/2015 0450   CO2 25 03/11/2015 0525   BUN 15 03/12/2015 0450   BUN 15 03/11/2015 0525   CREATININE 1.55* 03/12/2015 0450   CREATININE 1.47* 03/11/2015 0525   GLUCOSE 93 03/12/2015 0450   GLUCOSE 92 03/11/2015 0525   CALCIUM 7.7* 03/12/2015 0450   CALCIUM 7.4* 03/11/2015 0525   AST 24 03/12/2015 0450   AST 23 03/11/2015 0525   ALT 11* 03/12/2015  0450   ALT 11* 03/11/2015 0525   ALKPHOS 86 03/12/2015 0450   ALKPHOS 79 03/11/2015 0525   BILITOT 0.7 03/12/2015 0450   BILITOT 0.4 03/11/2015 0525   PROT 5.6* 03/12/2015 0450   PROT 5.3* 03/11/2015 0525   ALBUMIN 2.0* 03/12/2015 0450   ALBUMIN 1.9* 03/11/2015 0525    Studies/Results: Ct Abdomen Pelvis Wo Contrast  03/11/2015   CLINICAL DATA:  Laparoscopic cholecystectomy 01/04/2015. Large infected postop hematoma and percutaneous drain placement 02/27/2015. Bile leak.  EXAM: CT ABDOMEN AND PELVIS WITHOUT CONTRAST  TECHNIQUE: Multidetector CT imaging of the abdomen and pelvis was performed following the standard protocol without IV contrast.  COMPARISON:  02/16/2015  FINDINGS: There are small bilateral pleural effusions with bilateral lower lobe airspace opacities, likely atelectasis. Pneumonia not completely excluded in the right lung base.  Percutaneous drainage catheter is noted in the gallbladder fossa a region where there is near complete evacuation of the previously seen complex gas and fluid collection. Subcapsular fluid collection still noted and now contains gas superiorly. The fluid collection measures up to 10 cm and overall size is similar to prior study. Biliary stent is noted with pneumobilia. There is a small fluid collection anterior to the hepatic flexure which measures 2.2 cm, decreased in size when  this previously measured 3.9 cm. Previously seen ascites has resolved.  Spleen, pancreas, adrenals and kidneys have an unremarkable unenhanced appearance. No evidence of bowel obstruction. Urinary bladder is grossly unremarkable. Aorta is calcified, non aneurysmal. No evidence of bowel obstruction.  IMPRESSION: Near complete resolution of the gallbladder fossa complex fluid collection with drainage catheter in place. Of the subcapsular fluid collection along the right lateral liver margin is initially stable in size and now contains gas superiorly. Cannot exclude infection/abscess.  Near  complete resolution of the ascites.  Biliary stent in place.  Pneumobilia noted.  Small bilateral effusions with bibasilar atelectasis or infiltrates. Cannot exclude pneumonia in the right lower lobe.   Electronically Signed   By: Charlett Nose M.D.   On: 03/11/2015 15:58   Ct Aspiration  03/12/2015   CLINICAL DATA:  69 year old with history of bile [place] and recently placed biliary stent. The patient has a persistent perihepatic fluid collection of uncertain etiology. Request for sampling and possible drainage of this collection. Patient already has a gallbladder fossa drain.  EXAM: CT GUIDED ASPIRATION OF PERIHEPATIC FLUID COLLECTION  ANESTHESIA/SEDATION: 0.5 mg versed, 25 mcg fentanyl. A radiology nurse monitored the patient for moderate sedation.  Total Moderate Sedation Time: 6 minutes.  PROCEDURE: The procedure was explained to the patient. The risks and benefits of the procedure were discussed and the patient's questions were addressed. Informed consent was obtained from the patient. Patient was positioned on the CT scanner with the right side elevated. CT images of the abdomen were obtained. The right lateral abdomen was prepped and draped in a sterile fashion. The skin was anesthetized with 1% lidocaine. Using CT guidance, a 19 gauge Yueh catheter was directed into the perihepatic collection. 2 mL of bloody fluid was removed. No additional fluid could be obtained. Sample was sent for Gram stain and culture. The Yueh catheter was removed and a bandage was placed at the puncture site.  Complications: No immediate complication.  Estimated blood loss: Minimal  FINDINGS: There is a slightly irregular-shaped perihepatic fluid collection with gas along the cephalad aspect. Yueh catheter placed in this collection and only a small amount of blood could be obtained. Findings compatible with a large hematoma.  IMPRESSION: CT-guided aspiration of the perihepatic collection. Findings are most compatible with a  hematoma. The fluid sample was sent for Gram stain and culture.   Electronically Signed   By: Richarda Overlie M.D.   On: 03/12/2015 11:27   Dg Ercp Biliary & Pancreatic Ducts  03/10/2015   CLINICAL DATA:  69 year old male with potential common bile duct stones.  EXAM: ERCP  TECHNIQUE: Multiple spot images obtained with the fluoroscopic device and submitted for interpretation post-procedure.  FLUOROSCOPY TIME:  If the device does not provide the exposure index:  Fluoroscopy Time:  1.18 minutes  Number of Acquired Images:  4  COMPARISON:  CT the abdomen and pelvis 02/16/2015.  FINDINGS: Four images of the ERCP are provided for evaluation. Initial image demonstrates a pigtail drainage catheter in the right upper quadrant of the abdomen, presumably within the gallbladder fossa. Endoscope in position with cannulation of the Sphincter of Oddi and the guidewire extending into the distal common bile duct. Second image demonstrates similar findings. Third image demonstrates advancement of a guidewire into the intrahepatic biliary tree, and apparent extension of the guidewire into the region of the gallbladder fossa coming in close proximity to the indwelling catheter. Notably, injection at this time demonstrates opacification of the common bile duct and a  portion of the intrahepatic biliary tree (common bile duct is poorly opacified), and there appears to be a extravasation of contrast material into the gallbladder fossa around the pigtail drainage catheter. The fourth image demonstrates placement of a common bile duct stent which appears appropriately located, withdrawal of the endoscope, and persistence of contrast material within the gallbladder fossa.  IMPRESSION: 1. ERCP images document placement of CBD stent, as above. 2. Extravasation of contrast material into the gallbladder fossa, presumably within a persistent biloma. 3. Images provided were insufficient to accurately assess for the presence or absence of common bile  duct stones. Please refer to procedural note from ERCP for further details. These images were submitted for radiologic interpretation only. Please see the procedural report for the amount of contrast and the fluoroscopy time utilized.   Electronically Signed   By: Trudie Reed M.D.   On: 03/10/2015 17:41    Anti-infectives: Anti-infectives    Start     Dose/Rate Route Frequency Ordered Stop   03/10/15 1800  metroNIDAZOLE (FLAGYL) tablet 500 mg     500 mg Oral 4 times per day 03/10/15 1512     03/10/15 1515  cefTRIAXone (ROCEPHIN) 2 g in dextrose 5 % 50 mL IVPB     2 g 100 mL/hr over 30 Minutes Intravenous Every 24 hours 03/10/15 1512        Assessment & Plans: Lap chole with complicated post op course Bile leak from cystic duct  IR aspirated perihepatic fluid - 2cc - appears to be old blood  Drain in place  Advance to regular diet  Patient and wife planning discharge to home on Monday  Velora Heckler, MD, [facility] Office: [phone]   GERKIN,TODD Judie Petit 03/12/2015

## 2015-03-12 NOTE — Progress Notes (Signed)
Patient ID: Larry [name], male   DOB: [birth year], 69 y.o.   MRN: [id number] Patient is known to IR service with GB fossa drain.  Recently placed biliary stent for probable cystic duct leak.  CT demonstrated a persistent perihepatic fluid collection.  Request for aspiration or drainage of this collection.  Discussed CT-guided drainage/aspiration with patient and informed consent obtained.  Recent H & P, labs and imaging have been reviewed.  Physical Exam:  Lungs - Clear.  Heart - RRR.  Airway - OK. Plan for CT-guided aspiration/drainage of perihepatic fluid collection this morning with moderate sedation.

## 2015-03-12 NOTE — Care Management Note (Signed)
Case Management Note  Patient Details  Name: BRENTLY VOORHIS MRN: [id number] Date of Birth: 1946-03-28  Subjective/Objective:                  Cholelithiasis, ERCP  Action/Plan: Discharge planning  Expected Discharge Date:   (unknown)               Expected Discharge Plan:  Home w Home Health Services  In-House Referral:     Discharge planning Services  CM Consult  Post Acute Care Choice:    Choice offered to:  Patient  DME Arranged:  N/A DME Agency:  NA  HH Arranged:    HH Agency:     Status of Service:  In process, will continue to follow  Medicare Important Message Given:    Date Medicare IM Given:    Medicare IM give by:    Date Additional Medicare IM Given:    Additional Medicare Important Message give by:     If discussed at Long Length of Stay Meetings, dates discussed:    Additional Comments: CM spoke with patient and his sister at the bedside. Provided with a list on home health agencies to review with his sister and wife. Patient/family to contact CM with their decision.  Antony Haste, RN 03/12/2015, 12:34 PM

## 2015-03-12 NOTE — Progress Notes (Signed)
Patient ID: Larry [name], male   DOB: 1945-09-15, 69 y.o.   MRN: [id number]   Dr Fabio Bering consult note reviewed, patient with noncardiac edema. Maintaning SR, if afib recs are for IV amiodarone. Can restart anticoag when ok from GI standpoint. No new cardiac recs, please call over weekend if needed.       Dina Rich, M.D.

## 2015-03-12 NOTE — Evaluation (Signed)
Occupational Therapy Evaluation Patient Details Name: Larry [name] MRN: [id number] DOB: 1945/11/29 Today's Date: 03/12/2015    History of Present Illness 69 year old male with very long and complex history with his acute cholecystitis starting 10/06/2014 including multiple hospitalizations admiited due to new bile leak and had recent ERCP with biliary stent placement by GI 9/22    Clinical Impression   Patient presenting with deconditioning secondary to above. Patient independent prior to initial hospitalization. Patient currently functioning at an overall supervision level. Patient will benefit from acute OT to increase overall independence in the areas of ADLs, functional mobility, and overall safety in order to safely discharge home with family.     Follow Up Recommendations  Home health OT;Supervision/Assistance - 24 hour    Equipment Recommendations  None recommended by OT    Recommendations for Other Services  None at this time   Precautions / Restrictions Precautions Precautions: Fall Precaution Comments: JP drain on R, monitor HR Restrictions Weight Bearing Restrictions: No    Mobility Bed Mobility Overal bed mobility: Needs Assistance Bed Mobility: Supine to Sit Supine to sit: Supervision General bed mobility comments: supervision for safety, no physical assistance needed  Transfers Overall transfer level: Needs assistance Equipment used: Rolling walker (2 wheeled) Transfers: Sit to/from Stand Sit to Stand: Supervision   General transfer comment: supervision for safety. Cues for safety,  to hold both hands on walker and not to push IV pole    Balance Overall balance assessment: Needs assistance Sitting-balance support: No upper extremity supported;Feet supported Sitting balance-Leahy Scale: Good     Standing balance support: Bilateral upper extremity supported;During functional activity Standing balance-Leahy Scale: Fair    ADL Overall ADL's : Needs  assistance/impaired General ADL Comments: Pt requries assistance with LB ADLs seated in recliner, but states he normally sits EOB and brings foot up on bed. Pt able to ambulate into BR using RW with supervision. Pt transferred onto toilet seat with supervision using grab bars and supervision. Pt practiced walk-in shower transfer, using RW, stepping over threshold backwards. Pt reports that he will have 24/7 supervision from family members.      Pertinent Vitals/Pain Pain Assessment: No/denies pain     Hand Dominance Right   Extremity/Trunk Assessment Upper Extremity Assessment Upper Extremity Assessment: Overall WFL for tasks assessed   Lower Extremity Assessment Lower Extremity Assessment: Defer to PT evaluation   Cervical / Trunk Assessment Cervical / Trunk Assessment: Normal   Communication Communication Communication: No difficulties   Cognition Arousal/Alertness: Awake/alert Behavior During Therapy: WFL for tasks assessed/performed Overall Cognitive Status: Impaired/Different from baseline Area of Impairment: Safety/judgement Safety/Judgement: Decreased awareness of safety;Decreased awareness of deficits              Home Living Family/patient expects to be discharged to:: Private residence Living Arrangements: Spouse/significant other Available Help at Discharge: Family;Available 24 hours/day Type of Home: House Home Access: Stairs to enter [employer] of Steps: 2   Home Layout: One level     Bathroom Shower/Tub: Walk-in shower;Door   [employer] Toilet: Standard     Home Equipment: Environmental consultant - 2 wheels;Shower seat - built in;Bedside commode          Prior Functioning/Environment Level of Independence: Needs assistance  Gait / Transfers Assistance Needed: ambulatory with RW     Comments: admitted from SNF (rehab), wishes to d/c home    OT Diagnosis: Generalized weakness   OT Problem List: Decreased strength;Decreased range of motion;Decreased  activity tolerance;Impaired balance (sitting and/or standing);Decreased  knowledge of use of DME or AE;Cardiopulmonary status limiting activity;Pain;Impaired UE functional use;Increased edema;Decreased safety awareness   OT Treatment/Interventions: Self-care/ADL training;Therapeutic exercise;DME and/or AE instruction;Therapeutic activities;Patient/family education;Energy conservation    OT Goals(Current goals can be found in the care plan section) Acute Rehab OT Goals Patient Stated Goal: discharge home, does not wish to go back to SNF OT Goal Formulation: With patient Time For Goal Achievement: 03/26/15 Potential to Achieve Goals: Good ADL Goals Pt Will Perform Grooming: with modified independence;standing Pt Will Perform Lower Body Bathing: with modified independence;sit to/from stand Pt Will Perform Lower Body Dressing: sit to/from stand;with modified independence Pt Will Transfer to Toilet: with modified independence;ambulating;regular height toilet Pt Will Perform Tub/Shower Transfer: Shower transfer;shower seat;rolling walker;with modified independence;ambulating Additional ADL Goal #1: Pt will be mod I with functional mobility using RW prn  OT Frequency: Min 2X/week   Barriers to D/C:    None known at this time   End of Session Equipment Utilized During Treatment: Rolling walker Nurse Communication: Mobility status  Activity Tolerance: Patient tolerated treatment well Patient left: in chair;with call bell/phone within reach   Time: 1157-1224 OT Time Calculation (min): 27 min Charges:  OT General Charges $OT Visit: 1 Procedure OT Evaluation $Initial OT Evaluation Tier I: 1 Procedure OT Treatments $Self Care/Home Management : 8-22 mins  CLAY,PATRICIA , MS, OTR/L, CLT Pager: [phone]  03/12/2015, 1:23 PM

## 2015-03-13 LAB — GLUCOSE, CAPILLARY
GLUCOSE-CAPILLARY: 126 mg/dL — AB (ref 65–99)
Glucose-Capillary: 111 mg/dL — ABNORMAL HIGH (ref 65–99)
Glucose-Capillary: 122 mg/dL — ABNORMAL HIGH (ref 65–99)
Glucose-Capillary: 89 mg/dL (ref 65–99)

## 2015-03-13 LAB — CBC
HCT: 32.4 % — ABNORMAL LOW (ref 39.0–52.0)
Hemoglobin: 10 g/dL — ABNORMAL LOW (ref 13.0–17.0)
MCH: 30.1 pg (ref 26.0–34.0)
MCHC: 30.9 g/dL (ref 30.0–36.0)
MCV: 97.6 fL (ref 78.0–100.0)
PLATELETS: 292 10*3/uL (ref 150–400)
RBC: 3.32 MIL/uL — AB (ref 4.22–5.81)
RDW: 20.6 % — ABNORMAL HIGH (ref 11.5–15.5)
WBC: 6.2 10*3/uL (ref 4.0–10.5)

## 2015-03-13 LAB — BASIC METABOLIC PANEL
ANION GAP: 6 (ref 5–15)
BUN: 16 mg/dL (ref 6–20)
CALCIUM: 7.7 mg/dL — AB (ref 8.9–10.3)
CO2: 27 mmol/L (ref 22–32)
Chloride: 106 mmol/L (ref 101–111)
Creatinine, Ser: 1.43 mg/dL — ABNORMAL HIGH (ref 0.61–1.24)
GFR calc Af Amer: 57 mL/min — ABNORMAL LOW (ref >60)
GFR, EST NON AFRICAN AMERICAN: 49 mL/min — AB (ref >60)
Glucose, Bld: 129 mg/dL — ABNORMAL HIGH (ref 65–99)
POTASSIUM: 3.7 mmol/L (ref 3.5–5.1)
SODIUM: 139 mmol/L (ref 135–145)

## 2015-03-13 LAB — URINE CULTURE: Culture: 20000

## 2015-03-13 LAB — MAGNESIUM: MAGNESIUM: 1.4 mg/dL — AB (ref 1.7–2.4)

## 2015-03-13 NOTE — Progress Notes (Signed)
Patient ID: Larry [name], male   DOB: Jun 16, 1946, 69 y.o.   MRN: [id number]  General Surgery [facility]  Subjective: Patient without complaints.  Bright and alert.  Hoping to go home on Monday.  Objective: Vital signs in last 24 hours: Temp:  [97.6 F (36.4 C)-97.9 F (36.6 C)] 97.9 F (36.6 C) (09/25 0329) Pulse Rate:  [97-105] 99 (09/25 0329) Resp:  [16-22] 20 (09/25 0329) BP: (94-115)/(60-80) 104/62 mmHg (09/25 0329) SpO2:  [95 %-98 %] 97 % (09/25 0329) Last BM Date: 03/10/15  Intake/Output from previous day: 09/24 0701 - 09/25 0700 In: 2250.8 [P.O.:120; I.V.:2080.8; IV Piggyback:50] Out: 2470 [Urine:2050; Drains:420] Intake/Output this shift:    Physical Exam: HEENT - sclerae clear, mucous membranes moist Neck - soft Abdomen - soft, obese; drain RUQ with thin brownish output; non-tender   Lab Results:   Recent Labs  03/12/15 0500 03/13/15 0355  WBC 5.3 6.2  HGB 9.2* 10.0*  HCT 29.1* 32.4*  PLT 237 292   BMET  Recent Labs  03/12/15 0450 03/13/15 0355  NA 142 139  K 3.6 3.7  CL 108 106  CO2 26 27  GLUCOSE 93 129*  BUN 15 16  CREATININE 1.55* 1.43*  CALCIUM 7.7* 7.7*   PT/INR  Recent Labs  03/10/15 1333  LABPROT 17.2*  INR 1.39   Comprehensive Metabolic Panel:    Component Value Date/Time   NA 139 03/13/2015 0355   NA 142 03/12/2015 0450   K 3.7 03/13/2015 0355   K 3.6 03/12/2015 0450   CL 106 03/13/2015 0355   CL 108 03/12/2015 0450   CO2 27 03/13/2015 0355   CO2 26 03/12/2015 0450   BUN 16 03/13/2015 0355   BUN 15 03/12/2015 0450   CREATININE 1.43* 03/13/2015 0355   CREATININE 1.55* 03/12/2015 0450   GLUCOSE 129* 03/13/2015 0355   GLUCOSE 93 03/12/2015 0450   CALCIUM 7.7* 03/13/2015 0355   CALCIUM 7.7* 03/12/2015 0450   AST 24 03/12/2015 0450   AST 23 03/11/2015 0525   ALT 11* 03/12/2015 0450   ALT 11* 03/11/2015 0525   ALKPHOS 86 03/12/2015 0450   ALKPHOS 79 03/11/2015 0525   BILITOT 0.7 03/12/2015  0450   BILITOT 0.4 03/11/2015 0525   PROT 5.6* 03/12/2015 0450   PROT 5.3* 03/11/2015 0525   ALBUMIN 2.0* 03/12/2015 0450   ALBUMIN 1.9* 03/11/2015 0525    Studies/Results: Ct Abdomen Pelvis Wo Contrast  03/11/2015   CLINICAL DATA:  Laparoscopic cholecystectomy 01/04/2015. Large infected postop hematoma and percutaneous drain placement 02/27/2015. Bile leak.  EXAM: CT ABDOMEN AND PELVIS WITHOUT CONTRAST  TECHNIQUE: Multidetector CT imaging of the abdomen and pelvis was performed following the standard protocol without IV contrast.  COMPARISON:  02/16/2015  FINDINGS: There are small bilateral pleural effusions with bilateral lower lobe airspace opacities, likely atelectasis. Pneumonia not completely excluded in the right lung base.  Percutaneous drainage catheter is noted in the gallbladder fossa a region where there is near complete evacuation of the previously seen complex gas and fluid collection. Subcapsular fluid collection still noted and now contains gas superiorly. The fluid collection measures up to 10 cm and overall size is similar to prior study. Biliary stent is noted with pneumobilia. There is a small fluid collection anterior to the hepatic flexure which measures 2.2 cm, decreased in size when this previously measured 3.9 cm. Previously seen ascites has resolved.  Spleen, pancreas, adrenals and kidneys have an unremarkable unenhanced appearance. No evidence of bowel  obstruction. Urinary bladder is grossly unremarkable. Aorta is calcified, non aneurysmal. No evidence of bowel obstruction.  IMPRESSION: Near complete resolution of the gallbladder fossa complex fluid collection with drainage catheter in place. Of the subcapsular fluid collection along the right lateral liver margin is initially stable in size and now contains gas superiorly. Cannot exclude infection/abscess.  Near complete resolution of the ascites.  Biliary stent in place.  Pneumobilia noted.  Small bilateral effusions with  bibasilar atelectasis or infiltrates. Cannot exclude pneumonia in the right lower lobe.   Electronically Signed   By: Charlett Nose M.D.   On: 03/11/2015 15:58   Ct Aspiration  03/12/2015   CLINICAL DATA:  68 year old with history of bile [place] and recently placed biliary stent. The patient has a persistent perihepatic fluid collection of uncertain etiology. Request for sampling and possible drainage of this collection. Patient already has a gallbladder fossa drain.  EXAM: CT GUIDED ASPIRATION OF PERIHEPATIC FLUID COLLECTION  ANESTHESIA/SEDATION: 0.5 mg versed, 25 mcg fentanyl. A radiology nurse monitored the patient for moderate sedation.  Total Moderate Sedation Time: 6 minutes.  PROCEDURE: The procedure was explained to the patient. The risks and benefits of the procedure were discussed and the patient's questions were addressed. Informed consent was obtained from the patient. Patient was positioned on the CT scanner with the right side elevated. CT images of the abdomen were obtained. The right lateral abdomen was prepped and draped in a sterile fashion. The skin was anesthetized with 1% lidocaine. Using CT guidance, a 19 gauge Yueh catheter was directed into the perihepatic collection. 2 mL of bloody fluid was removed. No additional fluid could be obtained. Sample was sent for Gram stain and culture. The Yueh catheter was removed and a bandage was placed at the puncture site.  Complications: No immediate complication.  Estimated blood loss: Minimal  FINDINGS: There is a slightly irregular-shaped perihepatic fluid collection with gas along the cephalad aspect. Yueh catheter placed in this collection and only a small amount of blood could be obtained. Findings compatible with a large hematoma.  IMPRESSION: CT-guided aspiration of the perihepatic collection. Findings are most compatible with a hematoma. The fluid sample was sent for Gram stain and culture.   Electronically Signed   By: Richarda Overlie M.D.   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 [name] to make final decision  Velora Heckler, MD, [facility] Office: [phone]   GERKIN,TODD Judie Petit 03/13/2015

## 2015-03-14 DIAGNOSIS — N189 Chronic kidney disease, unspecified: Secondary | ICD-10-CM | POA: Diagnosis present

## 2015-03-14 LAB — BASIC METABOLIC PANEL
ANION GAP: 8 (ref 5–15)
BUN: 14 mg/dL (ref 6–20)
CHLORIDE: 106 mmol/L (ref 101–111)
CO2: 28 mmol/L (ref 22–32)
Calcium: 7.6 mg/dL — ABNORMAL LOW (ref 8.9–10.3)
Creatinine, Ser: 1.35 mg/dL — ABNORMAL HIGH (ref 0.61–1.24)
GFR calc non Af Amer: 52 mL/min — ABNORMAL LOW (ref >60)
Glucose, Bld: 98 mg/dL (ref 65–99)
POTASSIUM: 3.5 mmol/L (ref 3.5–5.1)
SODIUM: 142 mmol/L (ref 135–145)

## 2015-03-14 LAB — MAGNESIUM: MAGNESIUM: 1.2 mg/dL — AB (ref 1.7–2.4)

## 2015-03-14 LAB — CBC
HEMATOCRIT: 29.3 % — AB (ref 39.0–52.0)
HEMOGLOBIN: 9.1 g/dL — AB (ref 13.0–17.0)
MCH: 29.9 pg (ref 26.0–34.0)
MCHC: 31.1 g/dL (ref 30.0–36.0)
MCV: 96.4 fL (ref 78.0–100.0)
Platelets: 260 10*3/uL (ref 150–400)
RBC: 3.04 MIL/uL — AB (ref 4.22–5.81)
RDW: 20.6 % — ABNORMAL HIGH (ref 11.5–15.5)
WBC: 5.2 10*3/uL (ref 4.0–10.5)

## 2015-03-14 MED ORDER — POTASSIUM CHLORIDE CRYS ER 20 MEQ PO TBCR: 20.0000 meq | EXTENDED_RELEASE_TABLET | Freq: Every day | ORAL | Status: DC

## 2015-03-14 MED ORDER — DEXTROSE 5 % IV SOLN: 2.0000 g | INTRAVENOUS | Status: DC

## 2015-03-14 MED ORDER — ONDANSETRON 4 MG PO TBDP: 4.0000 mg | ORAL_TABLET | Freq: Four times a day (QID) | ORAL | Status: DC | PRN

## 2015-03-14 MED ORDER — MAGNESIUM OXIDE 400 (241.3 MG) MG PO TABS
400.0000 mg | ORAL_TABLET | Freq: Two times a day (BID) | ORAL | Status: DC
  Administered 2015-03-14: 400 mg via ORAL
  Filled 2015-03-14: qty 1

## 2015-03-14 MED ORDER — HEPARIN SOD (PORK) LOCK FLUSH 100 UNIT/ML IV SOLN
250.0000 [IU] | INTRAVENOUS | Status: AC | PRN
  Administered 2015-03-14: 250 [IU]

## 2015-03-14 NOTE — Progress Notes (Signed)
Waiting for Pam in [facility] to see and OK from CM to D/c w/ HH:pt will D/C with PICC and RUQ JP drain

## 2015-03-14 NOTE — Discharge Instructions (Signed)
You are to get daily IV antibiotic infusion to 10/6 or until discontinued by ID physician You are to take oral antibiotic (flagyl) to 10/6 or until discontinued by ID physician Please follow up with Drs Orvan Falconer (ID), Mayford Knife (cardiology) as scheduled Make appointment to see your Primary care doctor within 1 week of discharge Make appointment to see kidney doctor at [facility] within 2 weeks Empty and record drain output daily - bring to your appt to [facility]   Please call [facility] [phone] for  -Temp >101 -worsening abdominal pain -persistent nausea/vomiting -questions regarding your drain  Bulb Drain Home Care A bulb drain consists of a thin rubber tube and a soft, round bulb that creates a gentle suction. The rubber tube is placed in the area where you had surgery. A bulb is attached to the end of the tube that is outside the body. The bulb drain removes excess fluid that normally builds up in a surgical wound after surgery. The color and amount of fluid will vary. Immediately after surgery, the fluid is bright red and is a little thicker than water. It may gradually change to a yellow or pink color and become more thin and water-like. When the amount decreases to about 1 or 2 tbsp in 24 hours, your health care provider will usually remove it. DAILY CARE  Keep the bulb flat (compressed) at all times, except while emptying it. The flatness creates suction. You can flatten the bulb by squeezing it firmly in the middle and then closing the cap.  Keep sites where the tube enters the skin dry and covered with a bandage (dressing).  Secure the tube 1-2 in (2.5-5.1 cm) below the insertion sites to keep it from pulling on your stitches. The tube is stitched in place and will not slip out.  Secure the bulb as directed by your health care provider.  For the first 3 days after surgery, there usually is more fluid in the bulb. Empty the bulb whenever it becomes half  full because the bulb does not create enough suction if it is too full. The bulb could also overflow. Write down how much fluid you remove each time you empty your drain. Add up the amount removed in 24 hours.  Empty the bulb at the same time every day once the amount of fluid decreases and you only need to empty it once a day. Write down the amounts and the 24-hour totals to give to your health care provider. This helps your health care provider know when the tubes can be removed. EMPTYING THE BULB DRAIN Before emptying the bulb, get a measuring cup, a piece of paper and a pen, and wash your hands.  Gently run your fingers down the tube (stripping) to empty any drainage from the tubing into the bulb. This may need to be done several times a day to clear the tubing of clots and tissue.  Open the bulb cap to release suction, which causes it to inflate. Do not touch the inside of the cap.  Gently run your fingers down the tube (stripping) to empty any drainage from the tubing into the bulb.  Hold the cap out of the way, and pour fluid into the measuring cup.   Squeeze the bulb to provide suction.  Replace the cap.   Check the tape that holds the tube to your skin. If it is becoming loose, you can remove the loose piece of tape and apply a new one. Then, pin  the bulb to your shirt.   Write down the amount of fluid you emptied out. Write down the date and each time you emptied your bulb drain. (If there are 2 bulbs, note the amount of drainage from each bulb and keep the totals separate. Your health care provider will want to know the total amounts for each drain and which tube is draining more.)   Flush the fluid down the toilet and wash your hands.   Call your health care provider once you have less than 2 tbsp of fluid collecting in the bulb drain every 24 hours. If there is drainage around the tube site, change dressings and keep the area dry. Cleanse around tube with sterile saline  and place dry gauze around site. This gauze should be changed when it is soiled. If it stays clean and unsoiled, it should still be changed daily.  SEEK MEDICAL CARE IF:  Your drainage has a bad smell or is cloudy.   You have a fever.   Your drainage is increasing instead of decreasing.   Your tube fell out.   You have redness or swelling around the tube site.   You have drainage from a surgical wound.   Your bulb drain will not stay flat after you empty it.  MAKE SURE YOU:   Understand these instructions.  Will watch your condition.  Will get help right away if you are not doing well or get worse. Document Released: 06/01/2000 Document Revised: 10/19/2013 Document Reviewed: 11/07/2011 [facility] Patient Information 2015 [place], [place]. This information is not intended to replace advice given to you by your health care provider. Make sure you discuss any questions you have with your health care provider.

## 2015-03-14 NOTE — Care Management Important Message (Signed)
Important Message  Patient Details  Name: HESHAM WOMAC MRN: [id number] Date of Birth: June 09, 1946   Medicare Important Message Given:  [facility] notification given    Haskell Flirt 03/14/2015, 11:54 AMImportant Message  Patient Details  Name: RAVINDRA BARANEK MRN: [id number] Date of Birth: Nov 07, 1945   Medicare Important Message Given:  Yes-second notification given    Haskell Flirt 03/14/2015, 11:54 AM

## 2015-03-14 NOTE — Discharge Summary (Signed)
Physician Discharge Summary  GEARL BARATTA ZOX:[id number] DOB: 01-Mar-1946 DOA: 03/10/2015  PCP: No primary care provider on file.  Admit date: 03/10/2015 Discharge date: 03/14/2015  Recommendations for Outpatient Follow-up:  1. Homehealth for nursing (iv antibiotic), PT/OT  Follow-up Information    Follow up with Theda Belfast, MD.   Specialty:  Gastroenterology   Why:  Call and make an appointment in 3-4 weeks.   Contact information:   [address] [place] [address] [phone]       Follow up with Quintella Reichert, MD On 03/31/2015.   Specialty:  Cardiology   Why:  at 8:30 AM     Contact information:   [address] [place] [address] [phone]       Follow up with [name], MD. Schedule an appointment as soon as possible for a visit in 2 weeks.   Specialty:  General Surgery   Why:  For wound re-check   Contact information:   [address] [place] [address] [phone]       Follow up with Heyat, Perviz. Schedule an appointment as soon as possible for a visit in 1 week.   Specialty:  Internal Medicine   Why:  for medication review     Discharge Diagnoses:  Principal Problem:   Bile leak Active Problems:   Paroxysmal a-fib   Infected hematoma GB fossa of liver   Diabetes mellitus with renal complications   Anemia of chronic disease   Bile leak, postoperative   CAD (coronary artery disease)   Edema   Malnutrition of moderate degree   Chronic kidney disease  Surgical Procedure: ERCP with stent placement by Dr Elnoria Howard 9/22  Discharge Condition: good Disposition: home  Diet recommendation: carb modified  Filed Weights   03/10/15 1938 03/11/15 0727  Weight: 107.9 kg (237 lb 14 oz) 108 kg (238 lb 1.6 oz)    History of present illness:  69 year old Caucasian man who had presented back in the spring with acute calculous cholecystitis, sepsis. He underwent percutaneous cholecystostomy drain  placement. He presented for elective interval cholecystectomy in mid July. We resumed his Eliquis 4 days after surgery after discharge. He presented on August 4 with hypotension, anemia, and what appeared to be an infected postoperative hematoma in the gallbladder fossa. He had a prolonged hospitalization. He developed respiratory failure requiring intubation, acute renal failure requiring dialysis, TPN,. He underwent placement of a percutaneous drain in the gallbladder fossa for the hematoma. However it really never ddrained much fluid. We had radiology upsized to drain and eventually put TPA through the drain in efforts to try to break down the hematoma. Cardiology, renal, infectious disease saw the patient in consultation. He underwent paracentesis and had 3 L of fluid drained this finally grew Klebsiella. His previous cultures from the percutaneous drain had been negative. Infectious disease recommended Rocephin and Flagyl until October 6. He was discharged to a skilled nursing facility on September 10. The following week the patient's wife contacted our office stating that the drain which had not been draining much fluid while in the hospital perhaps 30 mL a day consistent with old blood had changed in nature. The drain was now draining quite heavily. I saw the patient in the office and the output was rust colored. It was unclear if he was now liquefying hematoma or perhaps he had developed a bile leak. He looked well. His labs were actually the best they have looked in quite some  time. We arrange for a nuclear medicine scan. On September 22 he underwent the nuclear medicine scan which was positive for a bile leak.  Hospital Course:  He had been transported back to the skilled nursing facility after his nuclear medicine scan. We arrange for him to be immediately transported back to the hospital for admission because of the bile leak. He had no fever. His white blood cell count was normal. His vital signs are  stable. He had been tolerating a regular diet. Gastroenterology was consult. Dr. Elnoria Howard saw him in consultation ended up taking him to the operating room that afternoon for an ERCP and stent placement. We continued the IV antibiotic and oral antibiotic that he had been placed on previously. Cardiology was consulted to help with his lower extremity edema. They felt that it was not cardiogenic in nature. He was maintained on Lasix. He did require some electrolyte replacements of potassium and magnesium. A repeat CT scan the following morning to evaluate the fluid collection demonstrated almost near resolution of the gallbladder fossa fluid collection; however, he had a oeri Hepatic fluid collection. It was unclear if this was old blood or perhaps undrained bile. On Saturday morning he underwent aspiration of this peri Hepatic fluid collection. They were only able to aspirate about 3 mL of old blood. PT and OT saw the patient in consultation and cleared him for home physical therapy and occupational therapy. On day of discharge his vital signs are stable. He is making good urine. He was tolerating a diet. He had no nausea or vomiting. His drain output was decreasing. His pedal edema had significant improved  BP 118/66 mmHg  Pulse 101  Temp(Src) 98.1 F (36.7 C) (Oral)  Resp 20  Ht  (1.702 m)  Wt 108 kg (238 lb 1.6 oz)  BMI 37.28 kg/m2  SpO2 95%  Gen: alert, NAD, non-toxic appearing Pupils: equal, no scleral icterus Pulm: Lungs clear to auscultation, symmetric chest rise CV: regular rate and rhythm Abd: soft, nontender, nondistended.  No cellulitis. No incisional hernia Ext: 1+ pedal edema, no calf tenderness Skin: no rash, no jaundice    Discharge Instructions  Discharge Instructions    (HEART FAILURE PATIENTS) Call MD:  Anytime you have any of the following symptoms: 1) 3 pound weight gain in 24 hours or 5 pounds in 1 week 2) shortness of breath, with or without a dry hacking cough 3)  swelling in the hands, feet or stomach 4) if you have to sleep on extra pillows at night in order to breathe.    Complete by:  As directed      Call MD for:  difficulty breathing, headache or visual disturbances    Complete by:  As directed      Call MD for:  persistant nausea and vomiting    Complete by:  As directed      Call MD for:  redness, tenderness, or signs of infection (pain, swelling, redness, odor or green/yellow discharge around incision site)    Complete by:  As directed      Call MD for:  severe uncontrolled pain    Complete by:  As directed      Diet Carb Modified    Complete by:  As directed      Increase activity slowly    Complete by:  As directed             Medication List    STOP taking these medications  chlorhexidine 0.12 % solution  Commonly known as:  PERIDEX     PHENERGAN 25 MG/ML injection  Generic drug:  promethazine     promethazine 25 MG suppository  Commonly known as:  PHENERGAN     promethazine 25 MG tablet  Commonly known as:  PHENERGAN      TAKE these medications        acetaminophen 325 MG tablet  Commonly known as:  TYLENOL  Take 2 tablets (650 mg total) by mouth every 6 (six) hours as needed for mild pain, moderate pain, fever or headache.     antiseptic oral rinse 0.05 % Liqd solution  Commonly known as:  CPC / CETYLPYRIDINIUM CHLORIDE 0.05%  7 mLs by Mouth Rinse route 2 times daily at 12 noon and 4 pm.     atorvastatin 80 MG tablet  Commonly known as:  LIPITOR  Take 80 mg by mouth at bedtime.     cefTRIAXone 2 g in dextrose 5 % 50 mL  Inject 2 g into the vein daily. For 4 weeks from 02/25/2015.     feeding supplement (ENSURE ENLIVE) Liqd  Take 237 mLs by mouth 2 (two) times daily between meals.     furosemide 40 MG tablet  Commonly known as:  LASIX  Take 40 mg by mouth daily.     glucose 4 GM chewable tablet  Chew 1 tablet by mouth as needed for low blood sugar (only if BS IS BELOW 70).     insulin aspart 100  UNIT/ML injection  Commonly known as:  novoLOG  Inject 0-9 Units into the skin 3 (three) times daily with meals. CBG < 70: implement hypoglycemia protocol CBG 70 - 120: 0 units CBG 121 - 150: 1 unit CBG 151 - 200: 2 units CBG 201 - 250: 3 units CBG 251 - 300: 5 units CBG 301 - 350: 7 units CBG 351 - 400: 9 units CBG > 400: call MD.     insulin glargine 100 UNIT/ML injection  Commonly known as:  LANTUS  Inject 0.15 mLs (15 Units total) into the skin daily.     lactobacillus acidophilus Tabs tablet  Take 1 tablet by mouth 2 (two) times daily.     levalbuterol 0.63 MG/3ML nebulizer solution  Commonly known as:  XOPENEX  Take 3 mLs (0.63 mg total) by nebulization every 6 (six) hours as needed for wheezing or shortness of breath.     LORazepam 0.5 MG tablet  Commonly known as:  ATIVAN  Take 0.5 mg by mouth at bedtime.     magnesium oxide 400 MG tablet  Commonly known as:  MAG-OX  Take 400 mg by mouth 2 (two) times daily.     Melatonin 3 MG Tabs  Take 3 mg by mouth at bedtime.     metroNIDAZOLE 500 MG tablet  Commonly known as:  FLAGYL  Take 1 tablet (500 mg total) by mouth every 6 (six) hours. For 4 weeks starting 02/25/2015.     ondansetron 4 MG disintegrating tablet  Commonly known as:  ZOFRAN-ODT  Take 1 tablet (4 mg total) by mouth every 6 (six) hours as needed for nausea.     pantoprazole 40 MG tablet  Commonly known as:  PROTONIX  Take 40 mg by mouth daily before breakfast.     potassium chloride SA 20 MEQ tablet  Commonly known as:  K-DUR,KLOR-CON  Take 1 tablet (20 mEq total) by mouth daily.     traMADol 50 MG tablet  Commonly known as:  ULTRAM  Take 50 mg by mouth every 6 (six) hours as needed (For pain.).     vitamin C 500 MG tablet  Commonly known as:  ASCORBIC ACID  Take 500 mg by mouth 2 (two) times daily.     zinc sulfate 220 MG capsule  Take 220 mg by mouth every morning.           Follow-up Information    Follow up with Theda Belfast, MD.    Specialty:  Gastroenterology   Why:  Call and make an appointment in 3-4 weeks.   Contact information:   [address] [place] [address] [phone]       Follow up with Quintella Reichert, MD On 03/31/2015.   Specialty:  Cardiology   Why:  at 8:30 AM     Contact information:   [address] [place] [address] [phone]       Follow up with [name], MD. Schedule an appointment as soon as possible for a visit in 2 weeks.   Specialty:  General Surgery   Why:  For wound re-check   Contact information:   [address] [place] [address] [phone]       Follow up with Heyat, Perviz. Schedule an appointment as soon as possible for a visit in 1 week.   Specialty:  Internal Medicine   Why:  for medication review       The results of significant diagnostics from this hospitalization (including imaging, microbiology, ancillary and laboratory) are listed below for reference.    Significant Diagnostic Studies: Ct Abdomen Pelvis Wo Contrast  03/11/2015   CLINICAL DATA:  Laparoscopic cholecystectomy 01/04/2015. Large infected postop hematoma and percutaneous drain placement 02/27/2015. Bile leak.  EXAM: CT ABDOMEN AND PELVIS WITHOUT CONTRAST  TECHNIQUE: Multidetector CT imaging of the abdomen and pelvis was performed following the standard protocol without IV contrast.  COMPARISON:  02/16/2015  FINDINGS: There are small bilateral pleural effusions with bilateral lower lobe airspace opacities, likely atelectasis. Pneumonia not completely excluded in the right lung base.  Percutaneous drainage catheter is noted in the gallbladder fossa a region where there is near complete evacuation of the previously seen complex gas and fluid collection. Subcapsular fluid collection still noted and now contains gas superiorly. The fluid collection measures up to 10 cm and overall size is similar to prior study. Biliary stent is noted with pneumobilia.  There is a small fluid collection anterior to the hepatic flexure which measures 2.2 cm, decreased in size when this previously measured 3.9 cm. Previously seen ascites has resolved.  Spleen, pancreas, adrenals and kidneys have an unremarkable unenhanced appearance. No evidence of bowel obstruction. Urinary bladder is grossly unremarkable. Aorta is calcified, non aneurysmal. No evidence of bowel obstruction.  IMPRESSION: Near complete resolution of the gallbladder fossa complex fluid collection with drainage catheter in place. Of the subcapsular fluid collection along the right lateral liver margin is initially stable in size and now contains gas superiorly. Cannot exclude infection/abscess.  Near complete resolution of the ascites.  Biliary stent in place.  Pneumobilia noted.  Small bilateral effusions with bibasilar atelectasis or infiltrates. Cannot exclude pneumonia in the right lower lobe.   Electronically Signed   By: Charlett Nose M.D.   On: 03/11/2015 15:58   Ct Abdomen Pelvis Wo Contrast  02/16/2015   CLINICAL DATA:  Six weeks post laparoscopic cholecystectomy complicated by a gallbladder fossa hematoma, followup, past surgical  drain that was pulled, now having vomiting and abdominal pain, unable to keep oral intake down, past history hypertension, atrial fibrillation, coronary artery disease post MI  EXAM: CT ABDOMEN AND PELVIS WITHOUT CONTRAST  TECHNIQUE: Multidetector CT imaging of the abdomen and pelvis was performed following the standard protocol without IV contrast. Sagittal and coronal MPR images reconstructed from axial data set. Patient drank a small amount of dilute oral contrast for exam.  COMPARISON:  02/07/2015  FINDINGS: Small RIGHT pleural effusion with bibasilar atelectasis.  Subcapsular fluid collection at lateral and posterior aspects of liver extending to dome, stable.  Pigtail drainage catheter identified within a persistent large fluid collection at the gallbladder fossa 11.7 x 9.0 x  8.5 cm previously 9.7 x 9.7 x 8.8 cm.  Additional subhepatic collection anteromedial to gallbladder collection containing air and fluid, decreased in size now measuring 5.0 x 5.0 x 4.4 cm, uncertain if communicating with the gallbladder fossa collection, previously measured 6.6 x 6.8 x 6.8 cm.  Multiple low-attenuation fluid collections without gas are seen throughout the abdomen including large amount of fluid at the gutters into the pelvis, interloop, perigastric, and anteriorly in the upper and mid abdomen, probably partially loculated at multiple sites.  Nasogastric tube extends to the pylorus.  Several foci of extraluminal gas are identified anterior to the gallbladder fossa collection, potentially related to the abscess collection and catheter drainage, without free intraperitoneal air at additional sites.  Spleen, pancreas, kidneys and adrenal glands unremarkable.  Scattered atherosclerotic calcifications.  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  Bladder decompressed by urinary bladder.  No mass, adenopathy, hernia or acute bone lesion.  IMPRESSION: Persistent large gas and fluid collection at the gallbladder fossa slightly larger than on previous exam despite pigtail drainage catheter.  Interval mild decrease in size of a collection in anteromedial to the gallbladder fossa collection, question communicating with drainage catheter.  Significant ascites, question partially loculated at sites in the upper abdomen.  Several small foci of gas are seen anterior to the gallbladder fossa collection, question related to percutaneous drainage, without additional free intraperitoneal air identified.   Electronically Signed   By: Ulyses Southward M.D.   On: 02/16/2015 11:24   Nm Hepatobiliary Including Gb  03/10/2015   CLINICAL DATA:  Laparoscopic cholecystectomy 01/04/2015. Infected hematoma gallbladder fossa. Evaluate for bile leak. Surgical drain in place.  EXAM: NUCLEAR MEDICINE HEPATOBILIARY  IMAGING  TECHNIQUE: Sequential images of the abdomen were obtained out to 60 minutes following intravenous administration of radiopharmaceutical.  RADIOPHARMACEUTICALS:  5.3 mCi Tc-51m  Choletec IV  COMPARISON:  CT 02/16/2015.  FINDINGS: The surgical drain was capped for the procedure. Initial images demonstrate homogeneous hepatic activity and prompt visualization of the biliary system. There is rapid accumulation of the radiopharmaceutical within the subhepatic fluid collection. Activity extends into the right pericolic gutter. This follows the distribution of fluid on prior CT. Activity also extends into the bowel.  IMPRESSION: Study is positive for a bile leak with rapid accumulation of activity in the fluid collections in the subhepatic space and right paracolic gutter. No evidence of biliary obstruction.   Electronically Signed   By: Carey Bullocks M.D.   On: 03/10/2015 09:22   Ct Aspiration  03/12/2015   CLINICAL DATA:  69 year old with history of bile [place] and recently placed biliary stent. The patient has a persistent perihepatic fluid collection of uncertain etiology. Request for sampling and possible drainage of this collection. Patient already has a gallbladder fossa drain.  EXAM: CT GUIDED ASPIRATION OF PERIHEPATIC FLUID COLLECTION  ANESTHESIA/SEDATION: 0.5 mg versed, 25 mcg fentanyl. A radiology nurse monitored the patient for moderate sedation.  Total Moderate Sedation Time: 6 minutes.  PROCEDURE: The procedure was explained to the patient. The risks and benefits of the procedure were discussed and the patient's questions were addressed. Informed consent was obtained from the patient. Patient was positioned on the CT scanner with the right side elevated. CT images of the abdomen were obtained. The right lateral abdomen was prepped and draped in a sterile fashion. The skin was anesthetized with 1% lidocaine. Using CT guidance, a 19 gauge Yueh catheter was directed into the perihepatic collection. 2  mL of bloody fluid was removed. No additional fluid could be obtained. Sample was sent for Gram stain and culture. The Yueh catheter was removed and a bandage was placed at the puncture site.  Complications: No immediate complication.  Estimated blood loss: Minimal  FINDINGS: There is a slightly irregular-shaped perihepatic fluid collection with gas along the cephalad aspect. Yueh catheter placed in this collection and only a small amount of blood could be obtained. Findings compatible with a large hematoma.  IMPRESSION: CT-guided aspiration of the perihepatic collection. Findings are most compatible with a hematoma. The fluid sample was sent for Gram stain and culture.   Electronically Signed   By: Richarda Overlie M.D.   On: 03/12/2015 11:27   US Paracentesis  02/16/2015   INDICATION: Patient with recent history of sepsis, gallbladder fossa abscess drainage, renal insufficiency, ascites. Request made for diagnostic and therapeutic paracentesis.  EXAM: ULTRASOUND-GUIDED DIAGNOSTIC AND THERAPEUTIC PARACENTESIS  COMPARISON:  Prior paracentesis on 02/09/2015  MEDICATIONS: None.  COMPLICATIONS: None immediate  TECHNIQUE: Informed written consent was obtained from the patient after a discussion of the risks, benefits and alternatives to treatment. A timeout was performed prior to the initiation of the procedure.  Initial ultrasound scanning demonstrates a moderate-to-large amount of ascites within the right lower abdominal quadrant. The right lower abdomen was prepped and draped in the usual sterile fashion. 1% lidocaine was used for local anesthesia. Under direct ultrasound guidance, a 19 gauge, 10-cm, Yueh catheter was introduced. An ultrasound image was saved for documentation purposed. The paracentesis was performed. The catheter was removed and a dressing was applied. The patient tolerated the procedure well without immediate post procedural complication.  FINDINGS: A total of approximately 3.1 liters of dark, bloody  fluid was removed. Samples were sent to the laboratory as requested by the clinical team.  IMPRESSION: Successful ultrasound-guided diagnostic and therapeutic paracentesis yielding 3.1 liters of peritoneal fluid. Critical care medicine notified of the above findings. The patient was given IV albumin during the procedure.  Read by: Jeananne Rama, PA-C   Electronically Signed   By: Irish Lack M.D.   On: 02/16/2015 16:56   Dg Chest Port 1 View  02/22/2015   CLINICAL DATA:  Cough.  Shortness of breath.  EXAM: PORTABLE CHEST - 1 VIEW  COMPARISON:  02/21/2015.  FINDINGS: NG tube and right PICC line in stable position. Cardiomegaly with normal pulmonary vascularity. Low lung volumes with bibasilar atelectasis. No pleural effusion or pneumothorax. Degenerative changes both shoulders.  IMPRESSION: 1. Lines and tubes in stable position. 2. Low lung volumes with bibasilar atelectasis. 3. Stable cardiomegaly.   Electronically Signed   By: Maisie Fus  Register   On: 02/22/2015 07:15   Dg Chest Port 1 View  02/21/2015   CLINICAL DATA:  Productive cough for 1 day.  Shortness of breath.  EXAM: PORTABLE CHEST - 1 VIEW  COMPARISON:  02/15/2015.  FINDINGS: Poor inspiration. The cardiac silhouette remains borderline enlarged. The right PICC is unchanged. Stable elevation of the right hemidiaphragm. Minimal right basilar atelectasis. Clear left lung. Mildly prominent interstitial markings without significant change. Thoracic spine degenerative changes. Cholecystectomy clips. Right upper quadrant abdominal pigtail catheter. Nasogastric tube tip in the proximal stomach. Bilateral shoulder degenerative changes, greater on the right.  IMPRESSION: 1. Poor inspiration with minimal right basilar atelectasis. 2. Mild chronic interstitial lung disease.   Electronically Signed   By: Beckie Salts M.D.   On: 02/21/2015 09:24   Dg Chest Port 1 View  02/15/2015   CLINICAL DATA:  Respiratory failure  EXAM: PORTABLE CHEST - 1 VIEW  COMPARISON:   02/14/2015  FINDINGS: The endotracheal tube and nasogastric tube have been removed. The left jugular central line extends into the SVC. There is unchanged right hemidiaphragm elevation. There is continued improvement, with ongoing resolution of interstitial edema. No confluent airspace consolidation is evident.  IMPRESSION: Continued improvement. Removal of endotracheal tube and nasogastric tube.   Electronically Signed   By: Ellery Plunk M.D.   On: 02/15/2015 06:49   Dg Chest Port 1 View  02/14/2015   CLINICAL DATA:  Respiratory acidosis, coronary artery disease, MI, intubated patient.  EXAM: PORTABLE CHEST - 1 VIEW  COMPARISON:  Portable chest x-ray of February 13, 2015  FINDINGS: The lungs remain hypoinflated. Elevation of the right hemidiaphragm is stable. The interstitial markings are less conspicuous bilaterally. The cardiac silhouette remains enlarged. The pulmonary vascularity is less engorged.  The endotracheal tube tip lies 3.8 cm above the carina. The esophagogastric tube tip lies in the gastric cardia with the proximal port just distal to the GE junction the left internal jugular venous catheter tip projects over the proximal SVC. The PICC line tip projects over the junction of the middle and distal portions of the SVC. There are chronic changes associated with both shoulders.  IMPRESSION: Interval improvement in the pulmonary interstitium consistent with resolving interstitial edema. Persistent elevation of the right hemidiaphragm and likely right basilar atelectasis or pneumonia. Support tubes in reasonable position.   Electronically Signed   By: David  [place] M.D.   On: 02/14/2015 07:07   Dg Chest Port 1 View  02/13/2015   CLINICAL DATA:  Acute respiratory failure. Hypertension. Coronary artery disease. Diabetes. Myocardial infarction.  EXAM: PORTABLE CHEST - 1 VIEW  COMPARISON:  One day prior  FINDINGS: Endotracheal tube terminates 1.9 cm above carina. Left internal jugular line tip at high  SVC. Right-sided PICC line terminates at mid to low SVC. Nasogastric terminates at the body of the stomach. Cardiomegaly accentuated by AP portable technique. Right hemidiaphragm elevation is moderate. Suspect a small right pleural effusion. No pneumothorax. Interstitial edema is mild and similar, given differences in inspiratory effort. Mild right base volume loss without lobar consolidation.  IMPRESSION: Given differences in inspiratory effort, similar mild interstitial edema.  Probable trace right pleural fluid.   Electronically Signed   By: Jeronimo Greaves M.D.   On: 02/13/2015 08:49   Dg Ercp Biliary & Pancreatic Ducts  03/10/2015   CLINICAL DATA:  69 year old male with potential common bile duct stones.  EXAM: ERCP  TECHNIQUE: Multiple spot images obtained with the fluoroscopic device and submitted for interpretation post-procedure.  FLUOROSCOPY TIME:  If the device does not provide the exposure index:  Fluoroscopy Time:  1.18 minutes  Number of Acquired Images:  4  COMPARISON:  CT the  abdomen and pelvis 02/16/2015.  FINDINGS: Four images of the ERCP are provided for evaluation. Initial image demonstrates a pigtail drainage catheter in the right upper quadrant of the abdomen, presumably within the gallbladder fossa. Endoscope in position with cannulation of the Sphincter of Oddi and the guidewire extending into the distal common bile duct. Second image demonstrates similar findings. Third image demonstrates advancement of a guidewire into the intrahepatic biliary tree, and apparent extension of the guidewire into the region of the gallbladder fossa coming in close proximity to the indwelling catheter. Notably, injection at this time demonstrates opacification of the common bile duct and a portion of the intrahepatic biliary tree (common bile duct is poorly opacified), and there appears to be a extravasation of contrast material into the gallbladder fossa around the pigtail drainage catheter. The fourth image  demonstrates placement of a common bile duct stent which appears appropriately located, withdrawal of the endoscope, and persistence of contrast material within the gallbladder fossa.  IMPRESSION: 1. ERCP images document placement of CBD stent, as above. 2. Extravasation of contrast material into the gallbladder fossa, presumably within a persistent biloma. 3. Images provided were insufficient to accurately assess for the presence or absence of common bile duct stones. Please refer to procedural note from ERCP for further details. These images were submitted for radiologic interpretation only. Please see the procedural report for the amount of contrast and the fluoroscopy time utilized.   Electronically Signed   By: Trudie Reed M.D.   On: 03/10/2015 17:41   Dg Abd Portable 1v  02/12/2015   CLINICAL DATA:  OG tube placement  EXAM: PORTABLE ABDOMEN - 1 VIEW  COMPARISON:  Portable exam 0958 hours compared to CT abdomen and pelvis of 02/07/2015  FINDINGS: Tip of orogastric tube is at the gastroesophageal junction, recommend advancing tube into stomach.  Percutaneous pigtail drainage catheter RIGHT upper quadrant, by prior CT within gallbladder fossa collection.  Bowel gas pattern is suboptimally visualized due to technique and rotation.  Bones demineralized.  IMPRESSION: Tip of orogastric tube is at gastroesophageal junction, recommend advancing tube into stomach.   Electronically Signed   By: Ulyses Southward M.D.   On: 02/12/2015 14:09    Microbiology: Recent Results (from the past 240 hour(s))  Culture, Urine     Status: None   Collection Time: 03/11/15 12:43 PM  Result Value Ref Range Status   Specimen Description URINE, CLEAN CATCH  Final   Special Requests NONE  Final   Culture   Final    20,000 COLONIES/mL YEAST Performed at [facility]    Report Status 03/13/2015 FINAL  Final  Body fluid culture     Status: None (Preliminary result)   Collection Time: 03/12/15  9:56 AM  Result Value  Ref Range Status   Specimen Description FLUID ABDOMEN  Final   Special Requests PERIHEPATIC HEMATOMA  Final   Gram Stain   Final    ABUNDANT WBC PRESENT,BOTH PMN AND MONONUCLEAR NO ORGANISMS SEEN    Culture   Final    NO GROWTH < 24 HOURS Performed at [facility]    Report Status PENDING  Incomplete     Labs: Basic Metabolic Panel:  Recent Labs Lab 03/10/15 1333 03/11/15 0500 03/11/15 0525 03/12/15 0450 03/13/15 0355 03/14/15 0615  NA 141  --  140 142 139 142  K 3.7  --  3.3* 3.6 3.7 3.5  CL 107  --  109 108 106 106  CO2 23  --  25 26 27 28   GLUCOSE 105*  --  92 93 129* 98  BUN 15  --  15 15 16 14   CREATININE 1.67*  --  1.47* 1.55* 1.43* 1.35*  CALCIUM 7.7*  --  7.4* 7.7* 7.7* 7.6*  MG  --  1.2*  --  1.6* 1.4* 1.2*   Liver Function Tests:  Recent Labs Lab 03/10/15 1333 03/11/15 0525 03/12/15 0450  AST 32 23 24  ALT 12* 11* 11*  ALKPHOS 83 79 86  BILITOT 0.7 0.4 0.7  PROT 6.1* 5.3* 5.6*  ALBUMIN 2.3* 1.9* 2.0*    Recent Labs Lab 03/10/15 1333  LIPASE 32   No results for input(s): AMMONIA in the last 168 hours. CBC:  Recent Labs Lab 03/10/15 1333 03/11/15 0525 03/12/15 0500 03/13/15 0355 03/14/15 0615  WBC 7.0 6.4 5.3 6.2 5.2  NEUTROABS 4.8  --   --   --   --   HGB 10.0* 8.9* 9.2* 10.0* 9.1*  HCT 31.8* 28.7* 29.1* 32.4* 29.3*  MCV 95.8 97.6 96.0 97.6 96.4  PLT 219 201 237 292 260   Cardiac Enzymes: No results for input(s): CKTOTAL, CKMB, CKMBINDEX, TROPONINI in the last 168 hours. BNP: BNP (last 3 results)  Recent Labs  03/10/15 1529  BNP 163.1*    ProBNP (last 3 results) No results for input(s): PROBNP in the last 8760 hours.  CBG:  Recent Labs Lab 03/12/15 2035 03/13/15 0003 03/13/15 0334 03/13/15 0729 03/13/15 1131  GLUCAP 96 89 126* 111* 122*    Active Problems:   Paroxysmal a-fib   Bile leak, postoperative   CAD (coronary artery disease)   Edema   Bile leak   Postoperative intra-abdominal abscess    Malnutrition of moderate degree   Time coordinating discharge: 25 minutes  Signed:  [name], MD [facility], [place] [phone] 03/14/2015, 9:54 AM

## 2015-03-14 NOTE — Progress Notes (Signed)
ready for d/c per [facility] pharm and CM awaiting for ride.

## 2015-03-14 NOTE — Progress Notes (Signed)
Occupational Therapy Treatment Patient Details Name: Larry [name] MRN: [id number] DOB: 1945-11-18 Today's Date: 03/14/2015    History of present illness 69 year old male with very long and complex history with his acute cholecystitis starting 10/06/2014 including multiple hospitalizations admiited due to new bile leak and had recent ERCP with biliary stent placement by GI 9/22    OT comments  Pt has BSC and walker at home  Follow Up Recommendations  Home health OT;Supervision/Assistance - 24 hour    Equipment Recommendations  None recommended by OT    Recommendations for Other Services      Precautions / Restrictions Precautions Precautions: Fall Precaution Comments: JP drain on R, monitor HR Restrictions Weight Bearing Restrictions: No       Mobility Bed Mobility Overal bed mobility: Modified Independent             General bed mobility comments: able to self rise and get back into bed without assist just increased time  Transfers Overall transfer level: Needs assistance Equipment used: Rolling walker (2 wheeled) Transfers: Sit to/from Stand Sit to Stand: Min guard Stand pivot transfers: Min assist       General transfer comment: VC for hand placement and reaching back for [facility]    Balance                                   ADL Overall ADL's : Needs assistance/impaired                         Toilet Transfer: Set up;BSC   Toileting- Clothing Manipulation and Hygiene: Set up;Sit to/from stand;Cueing for safety       Functional mobility during ADLs: Minimal assistance General ADL Comments: wife and family will A as needed                Cognition   Behavior During Therapy: WFL for tasks assessed/performed Overall Cognitive Status: Within Functional Limits for tasks assessed                       Extremity/Trunk Assessment                          Pertinent Vitals/ Pain       Pain Assessment:  No/denies pain Faces Pain Scale: Hurts a little bit Pain Location: sore R side Pain Descriptors / Indicators: Sore Pain Intervention(s): Monitored during session;Repositioned     Prior Functioning/Environment              Frequency       Progress Toward Goals  OT Goals(current goals can now be found in the care plan section)  Progress towards OT goals: Progressing toward goals     Plan Discharge plan remains appropriate       End of Session     Activity Tolerance Patient tolerated treatment well   Patient Left in bed with call bell and [facility] present           Time: 4098-1191 OT Time Calculation (min): 23 min  Charges: OT General Charges $OT Visit: 1 Procedure OT Treatments $Self Care/Home Management : 23-37 mins  REDDING, Lorraine D 03/14/2015, 11:28 AM

## 2015-03-14 NOTE — Progress Notes (Signed)
Pt selected [facility] and referral was given to in house rep for [facility] needs.

## 2015-03-14 NOTE — Progress Notes (Signed)
Subjective: No chest pain, no SOB  Objective: Vital signs in last 24 hours: Temp:  [98 F (36.7 C)-98.3 F (36.8 C)] 98.1 F (36.7 C) (09/26 0511) Pulse Rate:  [98-109] 101 (09/26 0511) Resp:  [20] 20 (09/26 0511) BP: (109-121)/(59-66) 118/66 mmHg (09/26 0511) SpO2:  [95 %-97 %] 95 % (09/26 0511) Weight change:  Last BM Date: 03/10/15 Intake/Output from previous day: -811 09/25 0701 - 09/26 0700 In: 2290 [P.O.:1080; I.V.:1200] Out: 2221 [Urine:2000; Drains:220; Stool:1] Intake/Output this shift:    PE: General:Pleasant affect, NAD Skin:Warm and dry, brisk capillary refill HEENT:normocephalic, sclera clear, mucus membranes moist Heart:S1S2 RRR without murmur, gallup, rub or click Lungs:clear, ant.  without rales, rhonchi, or wheezes ZOX:WRUE, non tender, + BS, do not palpate liver spleen or masses Ext:tr lower ext edema, 2+ pedal pulses, 2+ radial pulses, rash on Lt foot Neuro:alert and oriented X 3, MAE, follows commands, + facial symmetry Tele: SR rare PVC,  On the 24th burst of ST at 147   Lab Results:  Recent Labs  03/13/15 0355 03/14/15 0615  WBC 6.2 5.2  HGB 10.0* 9.1*  HCT 32.4* 29.3*  PLT 292 260   BMET  Recent Labs  03/13/15 0355 03/14/15 0615  NA 139 142  K 3.7 3.5  CL 106 106  CO2 27 28  GLUCOSE 129* 98  BUN 16 14  CREATININE 1.43* 1.35*  CALCIUM 7.7* 7.6*   No results for input(s): TROPONINI in the last 72 hours.  Invalid input(s): CK, MB  Lab Results  Component Value Date   TRIG 183* 02/21/2015   Lab Results  Component Value Date   HGBA1C 8.2* 10/06/2014     Lab Results  Component Value Date   TSH 3.295 10/06/2014    Hepatic Function Panel  Recent Labs  03/12/15 0450  PROT 5.6*  ALBUMIN 2.0*  AST 24  ALT 11*  ALKPHOS 86  BILITOT 0.7   No results for input(s): CHOL in the last 72 hours. No results for input(s): PROTIME in the last 72 hours.     Studies/Results: Ct Aspiration  03/12/2015   CLINICAL  DATA:  69 year old with history of bile [place] and recently placed biliary stent. The patient has a persistent perihepatic fluid collection of uncertain etiology. Request for sampling and possible drainage of this collection. Patient already has a gallbladder fossa drain.  EXAM: CT GUIDED ASPIRATION OF PERIHEPATIC FLUID COLLECTION  ANESTHESIA/SEDATION: 0.5 mg versed, 25 mcg fentanyl. A radiology nurse monitored the patient for moderate sedation.  Total Moderate Sedation Time: 6 minutes.  PROCEDURE: The procedure was explained to the patient. The risks and benefits of the procedure were discussed and the patient's questions were addressed. Informed consent was obtained from the patient. Patient was positioned on the CT scanner with the right side elevated. CT images of the abdomen were obtained. The right lateral abdomen was prepped and draped in a sterile fashion. The skin was anesthetized with 1% lidocaine. Using CT guidance, a 19 gauge Yueh catheter was directed into the perihepatic collection. 2 mL of bloody fluid was removed. No additional fluid could be obtained. Sample was sent for Gram stain and culture. The Yueh catheter was removed and a bandage was placed at the puncture site.  Complications: No immediate complication.  Estimated blood loss: Minimal  FINDINGS: There is a slightly irregular-shaped perihepatic fluid collection with gas along the cephalad aspect. Yueh catheter placed in this collection and only a small amount of  blood could be obtained. Findings compatible with a large hematoma.  IMPRESSION: CT-guided aspiration of the perihepatic collection. Findings are most compatible with a hematoma. The fluid sample was sent for Gram stain and culture.   Electronically Signed   By: Richarda Overlie M.D.   On: 03/12/2015 11:27    Medications: I have reviewed the patient's current medications. Scheduled Meds: . antiseptic oral rinse  7 mL Mouth Rinse q12n4p  . atorvastatin  80 mg Oral QHS  . cefTRIAXone  (ROCEPHIN) IVPB 2 gram/50 mL D5W (Pyxis)  2 g Intravenous Q24H  . feeding supplement (ENSURE ENLIVE)  237 mL Oral BID BM  . feeding supplement (PRO-STAT SUGAR FREE 64)  30 mL Oral BID  . furosemide  40 mg Oral BID  . insulin aspart  0-15 Units Subcutaneous 6 times per day  . lactobacillus acidophilus & bulgar  1 tablet Oral BID  . LORazepam  0.5 mg Oral QHS  . magnesium oxide  400 mg Oral BID  . metroNIDAZOLE  500 mg Oral 4 times per day  . multivitamins with iron  1 tablet Oral QPC supper  . pantoprazole  40 mg Oral QAC breakfast  . polymixin-bacitracin   Topical BID  . vitamin C  500 mg Oral BID  . zinc sulfate  220 mg Oral q morning - 10a   Continuous Infusions: . 0.9 % NaCl with KCl 20 mEq / L 50 mL/hr at 03/13/15 1602   PRN Meds:.acetaminophen **OR** acetaminophen, glucose, HYDROmorphone (DILAUDID) injection, levalbuterol, LORazepam, ondansetron **OR** ondansetron (ZOFRAN) IV, oxyCODONE-acetaminophen, sodium chloride, traMADol  Assessment/Plan: 69 y.o. with complications from GB infection/surgery. Been in hospital on/off for 54 days. Readmitted with bile leak requiring ERCP with 3rd spacing in abdomen. Cleared for cholecystectomy and surgery twice already Seen by Dr Mayford Knife in April and PA at end of June. Despite complicated course has not had any cardiac complications. . He does have CAD status post MI 2 years ago treated with stenting at [facility]. Had DES to RCA without obstructive disease in other coronary arteries. EF is normal He also has paroxysmal atrial fibrillation and has been on Eliquis. He has maintained normal sinus rhythm. CHADVASC score is 4. He also has hypertension, diabetes mellitus, and dyslipidemia. He has been off his statin during his acute illness. 2-D echo in the hospital shows normal LV function EF 60% with no regional wall motion abnormalities. He had mild MR. Been off eliquis for a while given complications and continued JP drain  Active  Problems:   Paroxysmal a-fib   Bile leak, postoperative   CAD (coronary artery disease)   Edema   Bile leak   Postoperative intra-abdominal abscess   Malnutrition of moderate degree  PAF: none noted, ST on the 24th.  CHADVASC score is 4--he has f/u with Dr. Mayford Knife on 03/31/15  Edema:  Non cardiac pedal edema only. BNP only 164 EF normal. Related to bedrest, obesity and 3rd spacing from abdomen. On bid lasix po (-811 last 24 hours.)  Improved edema   No weights.  CAD no chest pain ? Resume ASA while Eliquis on hold.  Anemia, Eliquis on hold.     LOS: 4 days   Time spent with pt. :15 minutes. [facility]  Nurse Practitioner Certified Pager [phone] or after 5pm and on weekends call [phone] 03/14/2015, 8:50 AM  History and all data above reviewed.  Patient examined.  I agree with the findings as above.  The patient exam reveals COR:RRR  ,  Lungs:  Clear  ,  Abd: Positive bowel sounds, no rebound no guarding, Ext No edema   .  All available labs, radiology testing, previous records reviewed. Agree with documented assessment and plan. Atrial fib:  The only active cardiac issues is trying to figure out when to restart anticoagulation.  Resume Eliquis when OK with primary team.  I would not suggest any advantage to using ASA.  If he is an ASA candidate from a bleeding risk standpoint he should be an Eliquis candidate.    Fayrene Fearing Hochrein  12:04 PM  03/14/2015

## 2015-03-14 NOTE — Progress Notes (Signed)
Physical Therapy Treatment Patient Details Name: Larry [name] MRN: [id number] DOB: [birth year] Today's Date: 03/14/2015    History of Present Illness 69 year old male with very long and complex history with his acute cholecystitis starting 10/06/2014 including multiple hospitalizations admiited due to new bile leak and had recent ERCP with biliary stent placement by GI 9/22     PT Comments    Pt excited to get to go home today.  Pt well know to me from prior admission. Assisted OOB to amb twice in hallway with one needed rest break.  Good steady/alternating gait.  Good safety cognition and aware of his own limitations to activity.    Follow Up Recommendations  Home health PT     Equipment Recommendations  None recommended by PT    Recommendations for Other Services       Precautions / Restrictions Precautions Precautions: Fall Precaution Comments: JP drain on R, monitor HR Restrictions Weight Bearing Restrictions: No    Mobility  Bed Mobility Overal bed mobility: Modified Independent             General bed mobility comments: able to self rise and get back into bed without assist just increased time  Transfers Overall transfer level: Needs assistance Equipment used: Rolling walker (2 wheeled) Transfers: Sit to/from Stand Sit to Stand: Supervision         General transfer comment: one VC on safety with turn completion and hand placement with stand to sit.  Ambulation/Gait Ambulation/Gait assistance: Supervision;Min guard Ambulation Distance (Feet): 300 Feet (150 feet x 2 one sitting rest break) Assistive device: Rolling walker (2 wheeled) Gait Pattern/deviations: Step-through pattern;Decreased stride length;Trunk flexed Gait velocity: decreased   General Gait Details: .  good alternating steady gait.  pt fatigues but knows his limits.   Stairs            Wheelchair Mobility    Modified Rankin (Stroke Patients Only)       Balance                                    Cognition Arousal/Alertness: Awake/alert Behavior During Therapy: WFL for tasks assessed/performed Overall Cognitive Status: Within Functional Limits for tasks assessed                      Exercises      General Comments        Pertinent Vitals/Pain Pain Assessment: Faces Faces Pain Scale: Hurts a little bit Pain Location: sore R side Pain Descriptors / Indicators: Sore Pain Intervention(s): Monitored during session;Repositioned    Home Living                      Prior Function            PT Goals (current goals can now be found in the care plan section) Progress towards PT goals: Progressing toward goals    Frequency  Min 3X/week    PT Plan Current plan remains appropriate    Co-evaluation             End of Session Equipment Utilized During Treatment: Gait belt Activity Tolerance: Patient tolerated treatment well Patient left: with call bell/phone within reach;in bed;with bed alarm set     Time: 1030-1050 PT Time Calculation (min) (ACUTE ONLY): 20 min  Charges:  $Gait Training: 8-22 mins  G Codes:      Rica Koyanagi  PTA [facility]  Acute  Rehab Pager      [phone]

## 2015-03-14 NOTE — Progress Notes (Signed)
D/c'd w/ family via w/c

## 2015-03-15 ENCOUNTER — Ambulatory Visit (HOSPITAL_COMMUNITY): Payer: Medicare Other

## 2015-03-15 LAB — BODY FLUID CULTURE: CULTURE: NO GROWTH

## 2015-03-16 ENCOUNTER — Inpatient Hospital Stay: Payer: Medicare Other | Admitting: Internal Medicine

## 2015-03-16 LAB — GLUCOSE, CAPILLARY
GLUCOSE-CAPILLARY: 112 mg/dL — AB (ref 65–99)
GLUCOSE-CAPILLARY: 113 mg/dL — AB (ref 65–99)
GLUCOSE-CAPILLARY: 94 mg/dL (ref 65–99)
Glucose-Capillary: 162 mg/dL — ABNORMAL HIGH (ref 65–99)
Glucose-Capillary: 164 mg/dL — ABNORMAL HIGH (ref 65–99)
Glucose-Capillary: 93 mg/dL (ref 65–99)
Glucose-Capillary: 98 mg/dL (ref 65–99)

## 2015-03-17 LAB — WOUND CULTURE: GRAM STAIN: NONE SEEN

## 2015-03-22 ENCOUNTER — Encounter (HOSPITAL_COMMUNITY): Payer: Self-pay | Admitting: Emergency Medicine

## 2015-03-22 ENCOUNTER — Emergency Department (HOSPITAL_COMMUNITY): Payer: Medicare Other

## 2015-03-22 ENCOUNTER — Inpatient Hospital Stay (HOSPITAL_COMMUNITY)
Admission: EM | Admit: 2015-03-22 | Discharge: 2015-03-30 | DRG: 378 | Disposition: A | Discharge status: Home Health | Payer: Medicare Other | Attending: Internal Medicine | Admitting: Internal Medicine

## 2015-03-22 DIAGNOSIS — R52 Pain, unspecified: Secondary | ICD-10-CM

## 2015-03-22 DIAGNOSIS — R188 Other ascites: Secondary | ICD-10-CM | POA: Diagnosis present

## 2015-03-22 DIAGNOSIS — Z794 Long term (current) use of insulin: Secondary | ICD-10-CM

## 2015-03-22 DIAGNOSIS — Z955 Presence of coronary angioplasty implant and graft: Secondary | ICD-10-CM

## 2015-03-22 DIAGNOSIS — K922 Gastrointestinal hemorrhage, unspecified: Secondary | ICD-10-CM | POA: Diagnosis not present

## 2015-03-22 DIAGNOSIS — Z8249 Family history of ischemic heart disease and other diseases of the circulatory system: Secondary | ICD-10-CM

## 2015-03-22 DIAGNOSIS — E669 Obesity, unspecified: Secondary | ICD-10-CM | POA: Diagnosis present

## 2015-03-22 DIAGNOSIS — N183 Chronic kidney disease, stage 3 unspecified: Secondary | ICD-10-CM | POA: Diagnosis present

## 2015-03-22 DIAGNOSIS — R1032 Left lower quadrant pain: Secondary | ICD-10-CM | POA: Diagnosis present

## 2015-03-22 DIAGNOSIS — D638 Anemia in other chronic diseases classified elsewhere: Secondary | ICD-10-CM | POA: Diagnosis present

## 2015-03-22 DIAGNOSIS — I959 Hypotension, unspecified: Secondary | ICD-10-CM | POA: Diagnosis present

## 2015-03-22 DIAGNOSIS — E1129 Type 2 diabetes mellitus with other diabetic kidney complication: Secondary | ICD-10-CM | POA: Diagnosis present

## 2015-03-22 DIAGNOSIS — I252 Old myocardial infarction: Secondary | ICD-10-CM

## 2015-03-22 DIAGNOSIS — Z833 Family history of diabetes mellitus: Secondary | ICD-10-CM

## 2015-03-22 DIAGNOSIS — I129 Hypertensive chronic kidney disease with stage 1 through stage 4 chronic kidney disease, or unspecified chronic kidney disease: Secondary | ICD-10-CM | POA: Diagnosis present

## 2015-03-22 DIAGNOSIS — N179 Acute kidney failure, unspecified: Secondary | ICD-10-CM | POA: Diagnosis present

## 2015-03-22 DIAGNOSIS — Z79899 Other long term (current) drug therapy: Secondary | ICD-10-CM

## 2015-03-22 DIAGNOSIS — E1122 Type 2 diabetes mellitus with diabetic chronic kidney disease: Secondary | ICD-10-CM | POA: Diagnosis present

## 2015-03-22 DIAGNOSIS — I480 Paroxysmal atrial fibrillation: Secondary | ICD-10-CM | POA: Diagnosis present

## 2015-03-22 DIAGNOSIS — K449 Diaphragmatic hernia without obstruction or gangrene: Secondary | ICD-10-CM | POA: Diagnosis present

## 2015-03-22 DIAGNOSIS — Z96642 Presence of left artificial hip joint: Secondary | ICD-10-CM | POA: Diagnosis present

## 2015-03-22 DIAGNOSIS — D62 Acute posthemorrhagic anemia: Secondary | ICD-10-CM | POA: Diagnosis present

## 2015-03-22 DIAGNOSIS — E46 Unspecified protein-calorie malnutrition: Secondary | ICD-10-CM | POA: Diagnosis present

## 2015-03-22 DIAGNOSIS — Z96652 Presence of left artificial knee joint: Secondary | ICD-10-CM | POA: Diagnosis present

## 2015-03-22 DIAGNOSIS — Z6836 Body mass index (BMI) 36.0-36.9, adult: Secondary | ICD-10-CM

## 2015-03-22 DIAGNOSIS — R109 Unspecified abdominal pain: Secondary | ICD-10-CM

## 2015-03-22 DIAGNOSIS — R000 Tachycardia, unspecified: Secondary | ICD-10-CM | POA: Diagnosis present

## 2015-03-22 DIAGNOSIS — Z79891 Long term (current) use of opiate analgesic: Secondary | ICD-10-CM

## 2015-03-22 DIAGNOSIS — I2510 Atherosclerotic heart disease of native coronary artery without angina pectoris: Secondary | ICD-10-CM | POA: Diagnosis present

## 2015-03-22 LAB — COMPREHENSIVE METABOLIC PANEL
ALBUMIN: 2.3 g/dL — AB (ref 3.5–5.0)
ALT: 11 U/L — ABNORMAL LOW (ref 17–63)
ANION GAP: 9 (ref 5–15)
AST: 34 U/L (ref 15–41)
Alkaline Phosphatase: 95 U/L (ref 38–126)
BILIRUBIN TOTAL: 0.6 mg/dL (ref 0.3–1.2)
BUN: 9 mg/dL (ref 6–20)
CALCIUM: 7.4 mg/dL — AB (ref 8.9–10.3)
CO2: 27 mmol/L (ref 22–32)
Chloride: 103 mmol/L (ref 101–111)
Creatinine, Ser: 1.3 mg/dL — ABNORMAL HIGH (ref 0.61–1.24)
GFR, EST NON AFRICAN AMERICAN: 55 mL/min — AB (ref >60)
GLUCOSE: 173 mg/dL — AB (ref 65–99)
POTASSIUM: 4.5 mmol/L (ref 3.5–5.1)
Sodium: 139 mmol/L (ref 135–145)
TOTAL PROTEIN: 6 g/dL — AB (ref 6.5–8.1)

## 2015-03-22 LAB — CBC
HEMATOCRIT: 33.5 % — AB (ref 39.0–52.0)
HEMOGLOBIN: 10.6 g/dL — AB (ref 13.0–17.0)
MCH: 30.5 pg (ref 26.0–34.0)
MCHC: 31.6 g/dL (ref 30.0–36.0)
MCV: 96.3 fL (ref 78.0–100.0)
Platelets: 339 10*3/uL (ref 150–400)
RBC: 3.48 MIL/uL — AB (ref 4.22–5.81)
RDW: 19.6 % — ABNORMAL HIGH (ref 11.5–15.5)
WBC: 9.9 10*3/uL (ref 4.0–10.5)

## 2015-03-22 LAB — PROTIME-INR
INR: 1.14 (ref 0.00–1.49)
PROTHROMBIN TIME: 14.7 s (ref 11.6–15.2)

## 2015-03-22 LAB — LIPASE, BLOOD: LIPASE: 28 U/L (ref 22–51)

## 2015-03-22 MED ORDER — ONDANSETRON HCL 4 MG/2ML IJ SOLN
4.0000 mg | Freq: Once | INTRAMUSCULAR | Status: AC
Start: 2015-03-22 — End: 2015-03-22
  Administered 2015-03-22: 4 mg via INTRAVENOUS
  Filled 2015-03-22: qty 2

## 2015-03-22 MED ORDER — ONDANSETRON 4 MG PO TBDP: 4.0000 mg | ORAL_TABLET | Freq: Once | ORAL | Status: DC | PRN

## 2015-03-22 MED ORDER — PANTOPRAZOLE SODIUM 40 MG IV SOLR
40.0000 mg | Freq: Once | INTRAVENOUS | Status: AC
  Administered 2015-03-22: 40 mg via INTRAVENOUS
  Filled 2015-03-22: qty 40

## 2015-03-22 MED ORDER — SODIUM CHLORIDE 0.9 % IV BOLUS (SEPSIS)
1000.0000 mL | Freq: Once | INTRAVENOUS | Status: AC
  Administered 2015-03-22: 1000 mL via INTRAVENOUS

## 2015-03-22 MED ORDER — PANTOPRAZOLE SODIUM 40 MG IV SOLR
80.0000 mg | Freq: Once | INTRAVENOUS | Status: AC
  Administered 2015-03-23: 80 mg via INTRAVENOUS
  Filled 2015-03-22: qty 80
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
## 2015-03-22 NOTE — ED Notes (Signed)
Picc would not pull back. Picc was flushed x 2 each line.

## 2015-03-22 NOTE — ED Notes (Signed)
Pt informed writer that he does not want to have an In and Out cath, pt sts he had just went to the bathroom and it hurts to have a cath.  Writer informed RN

## 2015-03-22 NOTE — ED Notes (Signed)
Bed: WA06 Expected date:  Expected time:  Means of arrival:  Comments: EMS 

## 2015-03-22 NOTE — ED Notes (Signed)
Pt is trying to urinate at this time

## 2015-03-23 ENCOUNTER — Inpatient Hospital Stay (HOSPITAL_COMMUNITY): Payer: Medicare Other

## 2015-03-23 ENCOUNTER — Encounter (HOSPITAL_COMMUNITY): Payer: Self-pay | Admitting: Emergency Medicine

## 2015-03-23 DIAGNOSIS — I480 Paroxysmal atrial fibrillation: Secondary | ICD-10-CM

## 2015-03-23 DIAGNOSIS — Z955 Presence of coronary angioplasty implant and graft: Secondary | ICD-10-CM | POA: Diagnosis not present

## 2015-03-23 DIAGNOSIS — N179 Acute kidney failure, unspecified: Secondary | ICD-10-CM | POA: Diagnosis present

## 2015-03-23 DIAGNOSIS — K922 Gastrointestinal hemorrhage, unspecified: Secondary | ICD-10-CM | POA: Diagnosis present

## 2015-03-23 DIAGNOSIS — E1122 Type 2 diabetes mellitus with diabetic chronic kidney disease: Secondary | ICD-10-CM | POA: Diagnosis present

## 2015-03-23 DIAGNOSIS — Z96642 Presence of left artificial hip joint: Secondary | ICD-10-CM | POA: Diagnosis present

## 2015-03-23 DIAGNOSIS — R1032 Left lower quadrant pain: Secondary | ICD-10-CM | POA: Diagnosis present

## 2015-03-23 DIAGNOSIS — Z79891 Long term (current) use of opiate analgesic: Secondary | ICD-10-CM | POA: Diagnosis not present

## 2015-03-23 DIAGNOSIS — Z794 Long term (current) use of insulin: Secondary | ICD-10-CM | POA: Diagnosis not present

## 2015-03-23 DIAGNOSIS — D638 Anemia in other chronic diseases classified elsewhere: Secondary | ICD-10-CM | POA: Diagnosis present

## 2015-03-23 DIAGNOSIS — E0821 Diabetes mellitus due to underlying condition with diabetic nephropathy: Secondary | ICD-10-CM | POA: Diagnosis not present

## 2015-03-23 DIAGNOSIS — Z79899 Other long term (current) drug therapy: Secondary | ICD-10-CM | POA: Diagnosis not present

## 2015-03-23 DIAGNOSIS — R109 Unspecified abdominal pain: Secondary | ICD-10-CM | POA: Diagnosis not present

## 2015-03-23 DIAGNOSIS — Z833 Family history of diabetes mellitus: Secondary | ICD-10-CM | POA: Diagnosis not present

## 2015-03-23 DIAGNOSIS — Z6836 Body mass index (BMI) 36.0-36.9, adult: Secondary | ICD-10-CM | POA: Diagnosis not present

## 2015-03-23 DIAGNOSIS — E0822 Diabetes mellitus due to underlying condition with diabetic chronic kidney disease: Secondary | ICD-10-CM | POA: Diagnosis not present

## 2015-03-23 DIAGNOSIS — K449 Diaphragmatic hernia without obstruction or gangrene: Secondary | ICD-10-CM | POA: Diagnosis present

## 2015-03-23 DIAGNOSIS — Z8249 Family history of ischemic heart disease and other diseases of the circulatory system: Secondary | ICD-10-CM | POA: Diagnosis not present

## 2015-03-23 DIAGNOSIS — I959 Hypotension, unspecified: Secondary | ICD-10-CM | POA: Diagnosis present

## 2015-03-23 DIAGNOSIS — E669 Obesity, unspecified: Secondary | ICD-10-CM | POA: Diagnosis present

## 2015-03-23 DIAGNOSIS — R1030 Lower abdominal pain, unspecified: Secondary | ICD-10-CM

## 2015-03-23 DIAGNOSIS — R188 Other ascites: Secondary | ICD-10-CM | POA: Diagnosis present

## 2015-03-23 DIAGNOSIS — N183 Chronic kidney disease, stage 3 unspecified: Secondary | ICD-10-CM | POA: Diagnosis present

## 2015-03-23 DIAGNOSIS — E46 Unspecified protein-calorie malnutrition: Secondary | ICD-10-CM | POA: Diagnosis present

## 2015-03-23 DIAGNOSIS — R000 Tachycardia, unspecified: Secondary | ICD-10-CM | POA: Diagnosis present

## 2015-03-23 DIAGNOSIS — I129 Hypertensive chronic kidney disease with stage 1 through stage 4 chronic kidney disease, or unspecified chronic kidney disease: Secondary | ICD-10-CM | POA: Diagnosis present

## 2015-03-23 DIAGNOSIS — Z96652 Presence of left artificial knee joint: Secondary | ICD-10-CM | POA: Diagnosis present

## 2015-03-23 DIAGNOSIS — I2510 Atherosclerotic heart disease of native coronary artery without angina pectoris: Secondary | ICD-10-CM | POA: Diagnosis present

## 2015-03-23 DIAGNOSIS — D62 Acute posthemorrhagic anemia: Secondary | ICD-10-CM | POA: Diagnosis not present

## 2015-03-23 DIAGNOSIS — I252 Old myocardial infarction: Secondary | ICD-10-CM | POA: Diagnosis not present

## 2015-03-23 LAB — URINALYSIS, ROUTINE W REFLEX MICROSCOPIC
BILIRUBIN URINE: NEGATIVE
GLUCOSE, UA: NEGATIVE mg/dL
HGB URINE DIPSTICK: NEGATIVE
Ketones, ur: NEGATIVE mg/dL
Nitrite: NEGATIVE
PH: 5 (ref 5.0–8.0)
Protein, ur: NEGATIVE mg/dL
SPECIFIC GRAVITY, URINE: 1.016 (ref 1.005–1.030)
Urobilinogen, UA: 0.2 mg/dL (ref 0.0–1.0)

## 2015-03-23 LAB — CBC WITH DIFFERENTIAL/PLATELET
Basophils Absolute: 0 10*3/uL (ref 0.0–0.1)
Basophils Relative: 0 %
EOS PCT: 0 %
Eosinophils Absolute: 0 10*3/uL (ref 0.0–0.7)
HCT: 31.1 % — ABNORMAL LOW (ref 39.0–52.0)
Hemoglobin: 9.8 g/dL — ABNORMAL LOW (ref 13.0–17.0)
LYMPHS ABS: 0.9 10*3/uL (ref 0.7–4.0)
LYMPHS PCT: 11 %
MCH: 30.8 pg (ref 26.0–34.0)
MCHC: 31.5 g/dL (ref 30.0–36.0)
MCV: 97.8 fL (ref 78.0–100.0)
MONO ABS: 1 10*3/uL (ref 0.1–1.0)
MONOS PCT: 12 %
Neutro Abs: 6.5 10*3/uL (ref 1.7–7.7)
Neutrophils Relative %: 77 %
PLATELETS: 288 10*3/uL (ref 150–400)
RBC: 3.18 MIL/uL — AB (ref 4.22–5.81)
RDW: 19.5 % — ABNORMAL HIGH (ref 11.5–15.5)
WBC: 8.4 10*3/uL (ref 4.0–10.5)

## 2015-03-23 LAB — CBC
HCT: 29.3 % — ABNORMAL LOW (ref 39.0–52.0)
HCT: 30.3 % — ABNORMAL LOW (ref 39.0–52.0)
HEMATOCRIT: 29.6 % — AB (ref 39.0–52.0)
Hemoglobin: 9.1 g/dL — ABNORMAL LOW (ref 13.0–17.0)
Hemoglobin: 9 g/dL — ABNORMAL LOW (ref 13.0–17.0)
Hemoglobin: 9.4 g/dL — ABNORMAL LOW (ref 13.0–17.0)
MCH: 30.1 pg (ref 26.0–34.0)
MCH: 29.5 pg (ref 26.0–34.0)
MCH: 30.5 pg (ref 26.0–34.0)
MCHC: 31.1 g/dL (ref 30.0–36.0)
MCHC: 30.4 g/dL (ref 30.0–36.0)
MCHC: 31 g/dL (ref 30.0–36.0)
MCV: 97 fL (ref 78.0–100.0)
MCV: 97 fL (ref 78.0–100.0)
MCV: 98.4 fL (ref 78.0–100.0)
PLATELETS: 262 10*3/uL (ref 150–400)
PLATELETS: 264 10*3/uL (ref 150–400)
Platelets: 257 10*3/uL (ref 150–400)
RBC: 3.02 MIL/uL — ABNORMAL LOW (ref 4.22–5.81)
RBC: 3.05 MIL/uL — ABNORMAL LOW (ref 4.22–5.81)
RBC: 3.08 MIL/uL — AB (ref 4.22–5.81)
RDW: 19.5 % — AB (ref 11.5–15.5)
RDW: 19.6 % — AB (ref 11.5–15.5)
RDW: 19.6 % — ABNORMAL HIGH (ref 11.5–15.5)
WBC: 8.3 10*3/uL (ref 4.0–10.5)
WBC: 9.2 10*3/uL (ref 4.0–10.5)
WBC: 9.4 10*3/uL (ref 4.0–10.5)

## 2015-03-23 LAB — URINE MICROSCOPIC-ADD ON

## 2015-03-23 LAB — COMPREHENSIVE METABOLIC PANEL
ALT: 10 U/L — ABNORMAL LOW (ref 17–63)
ANION GAP: 6 (ref 5–15)
AST: 22 U/L (ref 15–41)
Albumin: 2.1 g/dL — ABNORMAL LOW (ref 3.5–5.0)
Alkaline Phosphatase: 80 U/L (ref 38–126)
BUN: 9 mg/dL (ref 6–20)
CHLORIDE: 105 mmol/L (ref 101–111)
CO2: 30 mmol/L (ref 22–32)
Calcium: 7 mg/dL — ABNORMAL LOW (ref 8.9–10.3)
Creatinine, Ser: 1.15 mg/dL (ref 0.61–1.24)
GFR calc Af Amer: >60 mL/min (ref >60)
Glucose, Bld: 145 mg/dL — ABNORMAL HIGH (ref 65–99)
POTASSIUM: 4.4 mmol/L (ref 3.5–5.1)
Sodium: 141 mmol/L (ref 135–145)
Total Bilirubin: 0.6 mg/dL (ref 0.3–1.2)
Total Protein: 5.8 g/dL — ABNORMAL LOW (ref 6.5–8.1)

## 2015-03-23 LAB — TYPE AND SCREEN
ABO/RH(D): O NEG
ANTIBODY SCREEN: NEGATIVE

## 2015-03-23 LAB — GLUCOSE, CAPILLARY
GLUCOSE-CAPILLARY: 132 mg/dL — AB (ref 65–99)
GLUCOSE-CAPILLARY: 108 mg/dL — AB (ref 65–99)

## 2015-03-23 LAB — MRSA PCR SCREENING: MRSA BY PCR: NEGATIVE

## 2015-03-23 LAB — MAGNESIUM: MAGNESIUM: 1.1 mg/dL — AB (ref 1.7–2.4)

## 2015-03-23 LAB — CBG MONITORING, ED: GLUCOSE-CAPILLARY: 117 mg/dL — AB (ref 65–99)

## 2015-03-23 LAB — LACTIC ACID, PLASMA: Lactic Acid, Venous: 1.6 mmol/L (ref 0.5–2.0)

## 2015-03-23 MED ORDER — HYDROMORPHONE HCL 1 MG/ML IJ SOLN
1.0000 mg | INTRAMUSCULAR | Status: DC | PRN
Start: 2015-03-23 — End: 2015-03-27
  Administered 2015-03-23 – 2015-03-27 (×14): 1 mg via INTRAVENOUS
  Filled 2015-03-23 (×15): qty 1

## 2015-03-23 MED ORDER — INSULIN ASPART 100 UNIT/ML ~~LOC~~ SOLN
0.0000 [IU] | SUBCUTANEOUS | Status: DC
  Administered 2015-03-23 – 2015-03-28 (×2): 1 [IU] via SUBCUTANEOUS

## 2015-03-23 MED ORDER — METRONIDAZOLE IN NACL 5-0.79 MG/ML-% IV SOLN
500.0000 mg | Freq: Four times a day (QID) | INTRAVENOUS | Status: DC
  Administered 2015-03-23: 500 mg via INTRAVENOUS
  Filled 2015-03-23: qty 100

## 2015-03-23 MED ORDER — LEVALBUTEROL HCL 0.63 MG/3ML IN NEBU: 0.6300 mg | INHALATION_SOLUTION | Freq: Four times a day (QID) | RESPIRATORY_TRACT | Status: DC | PRN

## 2015-03-23 MED ORDER — ONDANSETRON HCL 4 MG PO TABS: 4.0000 mg | ORAL_TABLET | Freq: Four times a day (QID) | ORAL | Status: DC | PRN

## 2015-03-23 MED ORDER — IOHEXOL 300 MG/ML  SOLN
50.0000 mL | Freq: Once | INTRAMUSCULAR | Status: DC | PRN
  Administered 2015-03-23: 50 mL via ORAL
  Filled 2015-03-23: qty 50

## 2015-03-23 MED ORDER — ALBUTEROL SULFATE (2.5 MG/3ML) 0.083% IN NEBU
5.0000 mg | INHALATION_SOLUTION | Freq: Once | RESPIRATORY_TRACT | Status: AC
  Administered 2015-03-23: 5 mg via RESPIRATORY_TRACT
  Filled 2015-03-23: qty 6

## 2015-03-23 MED ORDER — PANTOPRAZOLE SODIUM 40 MG IV SOLR: 40.0000 mg | Freq: Two times a day (BID) | INTRAVENOUS | Status: DC

## 2015-03-23 MED ORDER — SODIUM CHLORIDE 0.9 % IJ SOLN
10.0000 mL | INTRAMUSCULAR | Status: DC | PRN
  Administered 2015-03-23: 20 mL
  Filled 2015-03-23: qty 40

## 2015-03-23 MED ORDER — CEFTRIAXONE SODIUM 2 G IJ SOLR
2.0000 g | INTRAMUSCULAR | Status: DC
  Administered 2015-03-23 – 2015-03-27 (×5): 2 g via INTRAVENOUS
  Filled 2015-03-23 (×6): qty 2

## 2015-03-23 MED ORDER — SODIUM CHLORIDE 0.9 % IJ SOLN
10.0000 mL | INTRAMUSCULAR | Status: DC | PRN
  Administered 2015-03-23 (×2): 10 mL
  Administered 2015-03-29: 20 mL
  Filled 2015-03-23 (×2): qty 40

## 2015-03-23 MED ORDER — ALTEPLASE 2 MG IJ SOLR
2.0000 mg | Freq: Once | INTRAMUSCULAR | Status: AC
  Administered 2015-03-23: 4 mg
  Filled 2015-03-23: qty 2

## 2015-03-23 MED ORDER — ACETAMINOPHEN 650 MG RE SUPP: 650.0000 mg | Freq: Four times a day (QID) | RECTAL | Status: DC | PRN

## 2015-03-23 MED ORDER — LORAZEPAM 2 MG/ML IJ SOLN
0.5000 mg | Freq: Once | INTRAMUSCULAR | Status: AC
  Administered 2015-03-23: 0.5 mg via INTRAVENOUS
  Filled 2015-03-23: qty 1

## 2015-03-23 MED ORDER — IOHEXOL 300 MG/ML  SOLN: 50.0000 mL | Freq: Once | INTRAMUSCULAR | Status: DC | PRN

## 2015-03-23 MED ORDER — INSULIN GLARGINE 100 UNIT/ML ~~LOC~~ SOLN
5.0000 [IU] | Freq: Every day | SUBCUTANEOUS | Status: DC
  Administered 2015-03-23 – 2015-03-25 (×2): 5 [IU] via SUBCUTANEOUS
  Filled 2015-03-23 (×5): qty 0.05

## 2015-03-23 MED ORDER — ONDANSETRON HCL 4 MG/2ML IJ SOLN
4.0000 mg | Freq: Four times a day (QID) | INTRAMUSCULAR | Status: DC | PRN
  Administered 2015-03-23 – 2015-03-29 (×4): 4 mg via INTRAVENOUS
  Filled 2015-03-23 (×4): qty 2

## 2015-03-23 MED ORDER — METRONIDAZOLE IN NACL 5-0.79 MG/ML-% IV SOLN
500.0000 mg | Freq: Three times a day (TID) | INTRAVENOUS | Status: DC
  Administered 2015-03-23 – 2015-03-28 (×15): 500 mg via INTRAVENOUS
  Filled 2015-03-23 (×15): qty 100

## 2015-03-23 MED ORDER — SODIUM CHLORIDE 0.9 % IV SOLN
INTRAVENOUS | Status: DC
  Administered 2015-03-23: 21:00:00 via INTRAVENOUS
  Administered 2015-03-24: 100 mL/h via INTRAVENOUS
  Administered 2015-03-27: 05:00:00 via INTRAVENOUS

## 2015-03-23 MED ORDER — SODIUM CHLORIDE 0.9 % IV SOLN: INTRAVENOUS | Status: DC

## 2015-03-23 MED ORDER — SODIUM CHLORIDE 0.9 % IV SOLN
8.0000 mg/h | INTRAVENOUS | Status: DC
  Administered 2015-03-23 – 2015-03-24 (×3): 8 mg/h via INTRAVENOUS
  Filled 2015-03-23 (×10): qty 80

## 2015-03-23 MED ORDER — SODIUM CHLORIDE 0.9 % IV SOLN
80.0000 mg | Freq: Once | INTRAVENOUS | Status: AC
  Administered 2015-03-23: 80 mg via INTRAVENOUS
  Filled 2015-03-23: qty 80

## 2015-03-23 MED ORDER — ALTEPLASE 2 MG IJ SOLR
2.0000 mg | Freq: Once | INTRAMUSCULAR | Status: AC
  Administered 2015-03-23: 2 mg
  Filled 2015-03-23: qty 2

## 2015-03-23 MED ORDER — MAGNESIUM SULFATE IN D5W 10-5 MG/ML-% IV SOLN
1.0000 g | Freq: Once | INTRAVENOUS | Status: AC
  Administered 2015-03-23: 1 g via INTRAVENOUS
  Filled 2015-03-23: qty 100

## 2015-03-23 MED ORDER — SODIUM CHLORIDE 0.9 % IV SOLN
1000.0000 mL | INTRAVENOUS | Status: DC
  Administered 2015-03-23 (×2): 1000 mL via INTRAVENOUS

## 2015-03-23 MED ORDER — ACETAMINOPHEN 325 MG PO TABS: 650.0000 mg | ORAL_TABLET | Freq: Four times a day (QID) | ORAL | Status: DC | PRN

## 2015-03-23 MED ORDER — SODIUM CHLORIDE 0.9 % IV SOLN
1000.0000 mL | Freq: Once | INTRAVENOUS | Status: AC
  Administered 2015-03-23: 1000 mL via INTRAVENOUS

## 2015-03-23 MED ORDER — IOHEXOL 300 MG/ML  SOLN: 25.0000 mL | Freq: Once | INTRAMUSCULAR | Status: DC | PRN

## 2015-03-23 NOTE — Progress Notes (Signed)
Attending Physican notified of signed and held orders not reviewed by doctor. The orders were put in by initial Physician prior to admission attending doctor. Will continue to monitor patient for reconciliation of orders.

## 2015-03-23 NOTE — ED Notes (Signed)
Delay with lab draw, waiting on nurse

## 2015-03-23 NOTE — ED Notes (Signed)
Patient transported to CT 

## 2015-03-23 NOTE — ED Notes (Signed)
Bed: ZO10 Expected date:  Expected time:  Means of arrival:  Comments: Hod for Res A

## 2015-03-23 NOTE — Consult Note (Signed)
Subjective: Pt known well to Dr. [name] as he underwent a lap chole in July of this year.  It was complicated by a post op hematoma that eventually got drained.  He went to SNF and was admitted just about 2 weeks ago with bile in his drain c/w bile leak.  GI was consulted and ERCP with stent placement was performed.  He was actually discharged to home at that time as he had improved.  He returns to the ED today with new onset LLQ abdominal pain that started yesterday.  No other initial symptoms, but by the time he arrived here he was having some nausea and vomiting, which was reported as coffee ground emesis.  He is going to be admitted and we have been asked to see him.   Objective: Vital signs in last 24 hours: Temp:  [98.3 F (36.8 C)-99.1 F (37.3 C)] 99.1 F (37.3 C) (10/05 1610) Pulse Rate:  [109-122] 113 (10/05 0632) Resp:  [0-28] 20 (10/05 9604) BP: (88-125)/(45-71) 100/52 mmHg (10/05 0632) SpO2:  [85 %-99 %] 97 % (10/05 5409)    Intake/Output from previous day: 10/04 0701 - 10/05 0700 In: 1000 [I.V.:1000] Out: -  Intake/Output this shift:    PE: Abd: soft, mild upper abdominal tenderness, but quite tender in the LLQ, no rebounding or peritoneal signs.  Some BS, ND, JP drain in place in the RUQ with some bile present. Heart: tachy  Lab Results:   Recent Labs  03/22/15 2230 03/23/15 0330  WBC 9.9 8.4  HGB 10.6* 9.8*  HCT 33.5* 31.1*  PLT 339 288   BMET  Recent Labs  03/22/15 2230  NA 139  K 4.5  CL 103  CO2 27  GLUCOSE 173*  BUN 9  CREATININE 1.30*  CALCIUM 7.4*   PT/INR  Recent Labs  03/22/15 2321  LABPROT 14.7  INR 1.14   CMP     Component Value Date/Time   NA 139 03/22/2015 2230   K 4.5 03/22/2015 2230   CL 103 03/22/2015 2230   CO2 27 03/22/2015 2230   GLUCOSE 173* 03/22/2015 2230   BUN 9 03/22/2015 2230   CREATININE 1.30* 03/22/2015 2230   CALCIUM 7.4* 03/22/2015 2230   PROT 6.0* 03/22/2015 2230   ALBUMIN 2.3* 03/22/2015 2230    AST 34 03/22/2015 2230   ALT 11* 03/22/2015 2230   ALKPHOS 95 03/22/2015 2230   BILITOT 0.6 03/22/2015 2230   GFRNONAA 55* 03/22/2015 2230   GFRAA >60 03/22/2015 2230   Lipase     Component Value Date/Time   LIPASE 28 03/22/2015 2230       Studies/Results: Dg Abd 1 View  03/23/2015   CLINICAL DATA:  Lower left abdominal pain for 24 hr.  EXAM: ABDOMEN - 1 VIEW  COMPARISON:  CT 03/11/2015  FINDINGS: The biliary stent and the percutaneous cholecystostomy catheter appear unchanged in position. The abdominal gas pattern is negative for obstruction or perforation. No biliary or urinary calculi are evident. Severe lumbar degenerative changes incidentally noted.  IMPRESSION: Normal abdominal gas pattern. Unchanged positions of the percutaneous cholecystostomy drain and the biliary stent.   Electronically Signed   By: Ellery Plunk M.D.   On: 03/23/2015 03:59   Dg Chest Portable 1 View  03/22/2015   CLINICAL DATA:  Abdominal pain  EXAM: PORTABLE CHEST 1 VIEW  COMPARISON:  02/22/2015  FINDINGS: Stable right PICC. NG tube removed. Bibasilar atelectasis is stable. Upper lungs clear. Normal heart size. No pneumothorax.  IMPRESSION: Stable bibasilar atelectasis.   Electronically Signed   By: Jolaine Click M.D.   On: 03/22/2015 23:33    Anti-infectives: Anti-infectives    Start     Dose/Rate Route Frequency Ordered Stop   03/23/15 0745  metroNIDAZOLE (FLAGYL) IVPB 500 mg     500 mg 100 mL/hr over 60 Minutes Intravenous Every 6 hours 03/23/15 0744         Assessment/Plan  1. New onset LLQ abdominal pain with coffee ground emesis -given this pain is a new different pain that his RUQ abdominal pain, Dr. Johna Sheriff felt it would be appropriate to order a CT scan to rule out a new problem, such as diverticulitis, etc. -GI has evaluated the patient and plans for upper endo tomorrow to evaluate the coffee ground emesis 2. S/p recent lap chole with bile leak -routine JP drain care 3. Other  medical problems -defer to medicine  LOS: 0 days    Maeven Mcdougall E 03/23/2015, 8:15 AM Pager: [phone]

## 2015-03-23 NOTE — Progress Notes (Signed)
Mr. Reaume was lying in bed and awake when I arrived. His son was bedside. He admitted to being depressed b/c of hospitalizations (having to come back this time). He also praised staff saying this is the best place to be if you are sick. Will refer to daytime Chaplain for continued visits. Please page if additional support is needed. Chaplain Elmarie Shiley Holder   03/23/15 1700  Clinical Encounter Type  Visited With Patient and family together

## 2015-03-23 NOTE — ED Notes (Signed)
Gave Report to NIKE RN

## 2015-03-23 NOTE — ED Notes (Signed)
MD at bedside. MD Hoxworth present speaking with pt

## 2015-03-23 NOTE — Progress Notes (Signed)
[facility]  Patient Status: Active pt with [facility] prior to this admission  [facility] is providing the following services: HHRN and [facility] for Home IV ABX.  [facility] hospital team will follow and support pt DC when ordered.  If patient discharges after hours, please call [phone].   [name] 03/23/2015, 10:49 AM

## 2015-03-23 NOTE — Progress Notes (Signed)
Larry [name] is listed as pcp per united health care insurance response hx in [facility] EPIC updated

## 2015-03-23 NOTE — ED Notes (Signed)
The surgeon is examining pt. As I write this (Dr. [name]).

## 2015-03-23 NOTE — ED Notes (Signed)
PENDING PROTONIC, LANTUS, ROCEPHIN 2 GRAMS
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
## 2015-03-23 NOTE — ED Notes (Signed)
IV team has assessed PICC line and opted not to use TPA due to potential bleed.  PICC line will NOT draw blood.
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
## 2015-03-23 NOTE — ED Notes (Signed)
MD at bedside. ADMITTING MD PRESENT 

## 2015-03-23 NOTE — ED Notes (Signed)
CT AT BS GIVEN ORAL CONTRAST

## 2015-03-23 NOTE — ED Notes (Signed)
IV team nurse at bedside - states she is unable to obtain blood return from PICC line.  Thinks PICC may be infected and advises to not use tPa for flushing. Will continue to use peripheral line.

## 2015-03-23 NOTE — Progress Notes (Signed)
Pt seen and examined, admitted earlier this am by Dr.Kakrakandy, pls see H&P for details 69/M with complex surgical history notable for acute calculus cholecystitis for which he underwent percutaneous cholecystostomy drain followed by elective interval cholecystectomy in July complicated by postoperative hematoma which was attempted to be drained multiple times. Subsequently admitted last month for bile leak underwent  ERCP and stent placement, was just discharged home on 9/26. He is supposed to complete his 4 weeks of IV ceftriaxone and metronidazole as recommended by infectious disease tomorrow (9/9 to 10/6) Now plans for EGD tomorrow, IV PPI, further plans per surgery  Zannie Cove, MD [phone]

## 2015-03-23 NOTE — ED Notes (Signed)
MD at bedside. Surgeon MD present to see this pt

## 2015-03-23 NOTE — ED Notes (Signed)
CBG 117 

## 2015-03-23 NOTE — Progress Notes (Signed)
Pt very well known to me. See my discharge summary from last week.  Events as described as PA - LLQ pain, some epigastric pain; n/v last night - this am Pt tearful at times, some anxiety (anxiety not new); "just want to get better" States he is averaging about 50cc/day from drain  Alert, nontoxic cta  Obese, soft, some epigastric and LLQ TTP Drain - bilious No edema  CT - nothing terribly new. Has some reaccumulation of ascites in LLQ behind rectus sheath which may be irritating peritoneum causing LLQ pain. No free air; has chronic superior hepatic fluid collection - old blood on previous aspiration. No significant gallbladder fossa fluid collection.   Labs ok.   Agree with EGD to r/o gastritis, esophagitis, ulcer Agree with iv abx for now (see previous ID rec) Agree with bid iv protonix for now No CHEMICAL VTE prophylaxis Will ask IR about LLQ abd fluid collection- probably not large enough to undergo paracentesis Pt/ot Will ask medicine to seek psych nurse liaison consult to help pt process/cope. If there is not a Land then i think he would benefit from psych consult Record JP daily  [name]. [name], MD, FACS General, Bariatric, & Minimally Invasive Surgery [facility] Surgery, [place]

## 2015-03-24 ENCOUNTER — Encounter (HOSPITAL_COMMUNITY)
Admission: EM | Disposition: A | Discharge status: Home Health | Payer: Self-pay | Source: Home / Self Care | Attending: Internal Medicine

## 2015-03-24 ENCOUNTER — Encounter (HOSPITAL_COMMUNITY): Payer: Self-pay

## 2015-03-24 ENCOUNTER — Inpatient Hospital Stay (HOSPITAL_COMMUNITY): Payer: Medicare Other

## 2015-03-24 DIAGNOSIS — R1032 Left lower quadrant pain: Secondary | ICD-10-CM

## 2015-03-24 DIAGNOSIS — E0821 Diabetes mellitus due to underlying condition with diabetic nephropathy: Secondary | ICD-10-CM

## 2015-03-24 DIAGNOSIS — D62 Acute posthemorrhagic anemia: Secondary | ICD-10-CM

## 2015-03-24 DIAGNOSIS — Z794 Long term (current) use of insulin: Secondary | ICD-10-CM

## 2015-03-24 HISTORY — PX: ESOPHAGOGASTRODUODENOSCOPY: SHX5428

## 2015-03-24 LAB — GLUCOSE, CAPILLARY
GLUCOSE-CAPILLARY: 107 mg/dL — AB (ref 65–99)
Glucose-Capillary: 105 mg/dL — ABNORMAL HIGH (ref 65–99)
Glucose-Capillary: 114 mg/dL — ABNORMAL HIGH (ref 65–99)
Glucose-Capillary: 117 mg/dL — ABNORMAL HIGH (ref 65–99)
Glucose-Capillary: 92 mg/dL (ref 65–99)
Glucose-Capillary: 93 mg/dL (ref 65–99)
Glucose-Capillary: 96 mg/dL (ref 65–99)

## 2015-03-24 LAB — CBC
HEMATOCRIT: 28.5 % — AB (ref 39.0–52.0)
Hemoglobin: 8.8 g/dL — ABNORMAL LOW (ref 13.0–17.0)
MCH: 30.4 pg (ref 26.0–34.0)
MCHC: 30.9 g/dL (ref 30.0–36.0)
MCV: 98.6 fL (ref 78.0–100.0)
Platelets: 264 10*3/uL (ref 150–400)
RBC: 2.89 MIL/uL — ABNORMAL LOW (ref 4.22–5.81)
RDW: 19.5 % — AB (ref 11.5–15.5)
WBC: 9 10*3/uL (ref 4.0–10.5)

## 2015-03-24 LAB — GRAM STAIN

## 2015-03-24 SURGERY — EGD (ESOPHAGOGASTRODUODENOSCOPY)
Anesthesia: Moderate Sedation

## 2015-03-24 MED ORDER — MIDAZOLAM HCL 10 MG/2ML IJ SOLN
INTRAMUSCULAR | Status: DC | PRN
  Administered 2015-03-24 (×2): 2 mg via INTRAVENOUS

## 2015-03-24 MED ORDER — DIPHENHYDRAMINE HCL 50 MG/ML IJ SOLN
INTRAMUSCULAR | Status: AC
  Filled 2015-03-24: qty 1

## 2015-03-24 MED ORDER — MIDAZOLAM HCL 5 MG/ML IJ SOLN
INTRAMUSCULAR | Status: AC
  Filled 2015-03-24: qty 2

## 2015-03-24 MED ORDER — FENTANYL CITRATE (PF) 100 MCG/2ML IJ SOLN
INTRAMUSCULAR | Status: AC
  Filled 2015-03-24: qty 2

## 2015-03-24 MED ORDER — FENTANYL CITRATE (PF) 100 MCG/2ML IJ SOLN
INTRAMUSCULAR | Status: DC | PRN
  Administered 2015-03-24 (×2): 25 ug via INTRAVENOUS

## 2015-03-24 NOTE — Op Note (Signed)
[facility] [address] [place], [address]   ENDOSCOPY PROCEDURE REPORT  PATIENT: Larry [name], Larry [name]  MR#: [id number] BIRTHDATE: Mar 15, 1946 , 69  yrs. old GENDER: male ENDOSCOPIST:Carigan Lister Elnoria Howard, MD REFERRED BY: PROCEDURE DATE:  2015-03-26 PROCEDURE:   EGD, diagnostic ASA CLASS:    Class III INDICATIONS: Hematemesis MEDICATION: Versed 4 mg IV and Fentanyl 50 mcg IV TOPICAL ANESTHETIC:   none  DESCRIPTION OF PROCEDURE:   After the risks and benefits of the procedure were explained, informed consent was obtained.  The EG [id number] ( [id number] )  endoscope was introduced through the mouth  and advanced to the second portion of the duodenum .  The instrument was slowly withdrawn as the mucosa was fully examined. Estimated blood loss is zero unless otherwise noted in this procedure report.    FINDINGS: A 3 cm hiatal hernia was identified.  No evidence of esophagitis.  The gastric lumen was normal.  The biliary stent was intact and draining bile.  No evidence of any wall ulceration or trauma from the stent.          The scope was then withdrawn from the patient and the procedure completed.  COMPLICATIONS: There were no immediate complications.  ENDOSCOPIC IMPRESSION: 1) Hiatal hernia.  No evidence of an upper GI bleed.  RECOMMENDATIONS: 1) Management per Surgery and Hospitalist.   _______________________________ eSigned:  [name], MD 03/26/2015 2:20 PM     cc:  CPT CODES: ICD CODES:  The ICD and CPT codes recommended by this software are interpretations from the data that the clinical staff has captured with the software.  The verification of the translation of this report to the ICD and CPT codes and modifiers is the sole responsibility of the health care institution and practicing physician where this report was generated.  [employer] will not be held responsible for the validity of the ICD and CPT codes included on this report.   AMA assumes no liability for data contained or not contained herein. CPT is a Publishing rights manager of the [employer].
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
## 2015-03-24 NOTE — Care Management Note (Signed)
Case Management Note  Patient Details  Name: REINHARDT LICAUSI MRN: [id number] Date of Birth: 07-Jan-1946  Subjective/Objective:                 Anemia and hypotension requiring iv bolus support   Action/Plan:Date:  Oct. 06, 2016 U.R. performed for needs and level of care. Will continue to follow for Case Management needs.  Marcelle Smiling, RN, BSN, [place]   [phone]   Expected Discharge Date:   (unknown)               Expected Discharge Plan:  Home/Self Care  In-House Referral:  NA  Discharge planning Services  CM Consult  Post Acute Care Choice:    Choice offered to:     DME Arranged:    DME Agency:     HH Arranged:    HH Agency:     Status of Service:  In process, will continue to follow  Medicare Important Message Given:    Date Medicare IM Given:    Medicare IM give by:    Date Additional Medicare IM Given:    Additional Medicare Important Message give by:     If discussed at Long Length of Stay Meetings, dates discussed:    Additional Comments:  Golda Acre, RN 03/24/2015, 9:39 AM

## 2015-03-24 NOTE — Progress Notes (Signed)
Subjective: Still having some LLQ pain, managed with pain medication; no vomiting  Objective: Vital signs in last 24 hours: Temp:  [98.3 F (36.8 C)-98.8 F (37.1 C)] 98.4 F (36.9 C) (10/06 0800) Pulse Rate:  [65-118] 99 (10/06 0600) Resp:  [9-25] 23 (10/06 0600) BP: (93-120)/(47-67) 100/54 mmHg (10/06 0600) SpO2:  [81 %-100 %] 94 % (10/06 0600) Weight:  [103.1 kg (227 lb 4.7 oz)-106.7 kg (235 lb 3.7 oz)] 106.7 kg (235 lb 3.7 oz) (10/06 0500)    Intake/Output from previous day: 10/05 0701 - 10/06 0700 In: 3983.3 [I.V.:3723.3; IV Piggyback:250] Out: 300 [Urine:200; Drains:50] Intake/Output this shift:    Alert, nontoxic cta Obese, soft, drain 60cc/24hrs bile; TTP LLQ - same No edema  Lab Results:   Recent Labs  03/23/15 2254 03/24/15 0222  WBC 9.4 9.0  HGB 9.4* 8.8*  HCT 30.3* 28.5*  PLT 264 264   BMET  Recent Labs  03/22/15 2230 03/23/15 1645  NA 139 141  K 4.5 4.4  CL 103 105  CO2 27 30  GLUCOSE 173* 145*  BUN 9 9  CREATININE 1.30* 1.15  CALCIUM 7.4* 7.0*   PT/INR  Recent Labs  03/22/15 2321  LABPROT 14.7  INR 1.14   ABG No results for input(s): PHART, HCO3 in the last 72 hours.  Invalid input(s): PCO2, PO2  Studies/Results: Ct Abdomen Pelvis Wo Contrast  03/23/2015   CLINICAL DATA:  Abdominal pain.  Cholecystectomy July 2016.  EXAM: CT ABDOMEN AND PELVIS WITHOUT CONTRAST  TECHNIQUE: Multidetector CT imaging of the abdomen and pelvis was performed following the standard protocol without IV contrast.  COMPARISON:  CT 03/11/2015, 02/16/2015  FINDINGS: Small right pleural effusion with compressive atelectasis right lower lobe.  Pigtail drainage catheter in the gallbladder fossa in good position. No significant residual fluid collection in this area.  Subcapsular fluid collection lateral and posterior to the liver is unchanged and contains gas. This has been aspirated in the past and is felt to be hematoma. This extends into the right colic  gutter and into the pelvis and overall is unchanged from the prior study. Remainder of the liver is normal. Spleen is normal in size  Kidneys show no obstruction or mass.  Pancreas is negative.  Progressive ascites throughout the abdomen most prominent in the pelvis anteriorly.  Negative for bowel obstruction.  No bowel edema.  No mass or adenopathy.  Atherosclerotic aorta.  Lumbar degenerative change. Mild chronic fracture T9 unchanged. Grade 1 anterior slip L4-5 due to disc and facet degeneration.  IMPRESSION: Pigtail drainage catheter in the gallbladder fossa without residual fluid collection  Moderately large fluid collection posterior lateral to the liver extending into the pelvis. This contains gas and is unchanged from the prior study. This has been aspirated with negative cultures.  Interval increase in ascites since the prior study.   Electronically Signed   By: Marlan Palau M.D.   On: 03/23/2015 11:26   Dg Abd 1 View  03/23/2015   CLINICAL DATA:  Lower left abdominal pain for 24 hr.  EXAM: ABDOMEN - 1 VIEW  COMPARISON:  CT 03/11/2015  FINDINGS: The biliary stent and the percutaneous cholecystostomy catheter appear unchanged in position. The abdominal gas pattern is negative for obstruction or perforation. No biliary or urinary calculi are evident. Severe lumbar degenerative changes incidentally noted.  IMPRESSION: Normal abdominal gas pattern. Unchanged positions of the percutaneous cholecystostomy drain and the biliary stent.   Electronically Signed   By: Rosey Bath.D.  On: 03/23/2015 03:59   Dg Chest Portable 1 View  03/22/2015   CLINICAL DATA:  Abdominal pain  EXAM: PORTABLE CHEST 1 VIEW  COMPARISON:  02/22/2015  FINDINGS: Stable right PICC. NG tube removed. Bibasilar atelectasis is stable. Upper lungs clear. Normal heart size. No pneumothorax.  IMPRESSION: Stable bibasilar atelectasis.   Electronically Signed   By: Jolaine Click M.D.   On: 03/22/2015 23:33     Anti-infectives: Anti-infectives    Start     Dose/Rate Route Frequency Ordered Stop   03/23/15 1800  metroNIDAZOLE (FLAGYL) IVPB 500 mg     500 mg 100 mL/hr over 60 Minutes Intravenous Every 8 hours 03/23/15 1640     03/23/15 1430  cefTRIAXone (ROCEPHIN) 2 g in dextrose 5 % 50 mL IVPB     2 g 100 mL/hr over 30 Minutes Intravenous Every 24 hours 03/23/15 1422     03/23/15 0745  metroNIDAZOLE (FLAGYL) IVPB 500 mg  Status:  Discontinued     500 mg 100 mL/hr over 60 Minutes Intravenous Every 6 hours 03/23/15 0744 03/23/15 1335      Assessment/Plan: s/p Procedure(s): ESOPHAGOGASTRODUODENOSCOPY (EGD) (N/A) N/v/LLQ pain Bile leak s/p drain and stent  EGD today Can back off frequent blood draws - believe small drop in h/h is dilutional Cont protonix IR can aspirate LLQ ascites abutting peritoneum which I think is irritating his peritoneum. Will ask them to proceed with that - send fluid for culture No CHEMICAL VTE prophylaxis Pt/ot Can have diet from my point of view once procedures complete  [name]. [name], MD, FACS General, Bariatric, & Minimally Invasive Surgery [facility] Surgery, [place]   LOS: 1 day    [name] 03/24/2015

## 2015-03-24 NOTE — Interval H&P Note (Signed)
History and Physical Interval Note:  03/24/2015 1:58 PM  Larry [name]  has presented today for surgery, with the diagnosis of Hematemesis  The various methods of treatment have been discussed with the patient and family. After consideration of risks, benefits and other options for treatment, the patient has consented to  Procedure(s): ESOPHAGOGASTRODUODENOSCOPY (EGD) (N/A) as a surgical intervention .  The patient's history has been reviewed, patient examined, no change in status, stable for surgery.  I have reviewed the patient's chart and labs.  Questions were answered to the patient's satisfaction.     [name] D

## 2015-03-24 NOTE — Procedures (Signed)
Successful US guided paracentesis from Left midline abdomen.  Yielded 35 ml of cloudy bloody fluid.  No immediate complications.  Pt tolerated well.   Specimen was sent for labs.  Pattricia Boss D PA-C 03/24/2015 10:33 AM

## 2015-03-24 NOTE — Progress Notes (Signed)
[facility]  Patient Status: Active (receiving services up to time of hospitalization)  [facility] is providing the following services: RN and PT  If patient discharges after hours, please call [phone].   Lanae Crumbly 03/24/2015, 9:59 AM

## 2015-03-24 NOTE — Progress Notes (Signed)
[facility] PROGRESS NOTE  Larry [name] ZOX:[id number] DOB: [birth year] DOA: 03/22/2015 PCP: [name], MD  Assessment/Plan: 1. Upper GI Bleed -suspect mallory weiss tear -continue PPI, EGD today, Dr.Hung following  2.LLQ Abdominal pain -suspect related to intra abd fluid collection/peritoneal irritation -h/o Bile leak s/p ERCP and stent placement -no acute findings on CT -plan for US paracentesis today, FU culture  3. H/o intra abd hematoma post cholecystectomy, culture positive for Klebsiella, has been on IV Ceftriaxone and FLagyl since 9/9 for 4 weeks, today is last day of Abx  4. Diabetes mellitus type 2 with renal complications - patient reduced dose of Lantus , SSI  5. Paroxysmal atrial fibrillation  - presently off anticoagulation, in NSR, monitor.  6. CAD status post stenting  - denies any chest pain, stable  7. Acute renal failure - -resolved with hydration  8. Anemia of chronic disease -slight drop due to dilution most likely, monitor  Code Status: Full Code Family Communication: none at bedside Disposition Plan: home when improved   Consultants:  GI  [facility]S Dr.Wilson  Antibiotics:  Ceftriaxone/Flagyl  HPI/Subjective: Still with abd pain, tearful   Objective: Filed Vitals:   03/24/15 1055  BP:   Pulse: 102  Temp:   Resp: 22    Intake/Output Summary (Last 24 hours) at 03/24/15 1205 Last data filed at 03/24/15 1100  Gross per 24 hour  Intake 2373.33 ml  Output    485 ml  Net 1888.33 ml   Filed Weights   03/23/15 1709 03/24/15 0500  Weight: 103.1 kg (227 lb 4.7 oz) 106.7 kg (235 lb 3.7 oz)    Exam:   General:  AAOx3  Cardiovascular: S1S2/RRR  Respiratory: CTAB  Abdomen: soft, obese, Tender in LLQ , BS diminished  Musculoskeletal: no edema c/c/  Data Reviewed: Basic Metabolic Panel:  Recent Labs Lab 03/22/15 2230 03/23/15 0336 03/23/15 1645  NA 139  --  141  K 4.5  --  4.4  CL 103  --  105  CO2 27  --  30   GLUCOSE 173*  --  145*  BUN 9  --  9  CREATININE 1.30*  --  1.15  CALCIUM 7.4*  --  7.0*  MG  --  1.1*  --    Liver Function Tests:  Recent Labs Lab 03/22/15 2230 03/23/15 1645  AST 34 22  ALT 11* 10*  ALKPHOS 95 80  BILITOT 0.6 0.6  PROT 6.0* 5.8*  ALBUMIN 2.3* 2.1*    Recent Labs Lab 03/22/15 2230  LIPASE 28   No results for input(s): AMMONIA in the last 168 hours. CBC:  Recent Labs Lab 03/23/15 0330 03/23/15 1506 03/23/15 1828 03/23/15 2254 03/24/15 0222  WBC 8.4 8.3 9.2 9.4 9.0  NEUTROABS 6.5  --   --   --   --   HGB 9.8* 9.1* 9.0* 9.4* 8.8*  HCT 31.1* 29.3* 29.6* 30.3* 28.5*  MCV 97.8 97.0 97.0 98.4 98.6  PLT 288 262 257 264 264   Cardiac Enzymes: No results for input(s): CKTOTAL, CKMB, CKMBINDEX, TROPONINI in the last 168 hours. BNP (last 3 results)  Recent Labs  03/10/15 1529  BNP 163.1*    ProBNP (last 3 results) No results for input(s): PROBNP in the last 8760 hours.  CBG:  Recent Labs Lab 03/23/15 1715 03/23/15 1958 03/23/15 2348 03/24/15 0321 03/24/15 0744  GLUCAP 132* 108* 96 105* 92    Recent Results (from the past 240 hour(s))  MRSA PCR Screening  Status: None   Collection Time: 03/23/15  4:03 PM  Result Value Ref Range Status   MRSA by PCR NEGATIVE NEGATIVE Final    Comment:        The GeneXpert MRSA Assay (FDA approved for NASAL specimens only), is one component of a comprehensive MRSA colonization surveillance program. It is not intended to diagnose MRSA infection nor to guide or monitor treatment for MRSA infections.      Studies: Ct Abdomen Pelvis Wo Contrast  03/23/2015   CLINICAL DATA:  Abdominal pain.  Cholecystectomy July 2016.  EXAM: CT ABDOMEN AND PELVIS WITHOUT CONTRAST  TECHNIQUE: Multidetector CT imaging of the abdomen and pelvis was performed following the standard protocol without IV contrast.  COMPARISON:  CT 03/11/2015, 02/16/2015  FINDINGS: Small right pleural effusion with compressive  atelectasis right lower lobe.  Pigtail drainage catheter in the gallbladder fossa in good position. No significant residual fluid collection in this area.  Subcapsular fluid collection lateral and posterior to the liver is unchanged and contains gas. This has been aspirated in the past and is felt to be hematoma. This extends into the right colic gutter and into the pelvis and overall is unchanged from the prior study. Remainder of the liver is normal. Spleen is normal in size  Kidneys show no obstruction or mass.  Pancreas is negative.  Progressive ascites throughout the abdomen most prominent in the pelvis anteriorly.  Negative for bowel obstruction.  No bowel edema.  No mass or adenopathy.  Atherosclerotic aorta.  Lumbar degenerative change. Mild chronic fracture T9 unchanged. Grade 1 anterior slip L4-5 due to disc and facet degeneration.  IMPRESSION: Pigtail drainage catheter in the gallbladder fossa without residual fluid collection  Moderately large fluid collection posterior lateral to the liver extending into the pelvis. This contains gas and is unchanged from the prior study. This has been aspirated with negative cultures.  Interval increase in ascites since the prior study.   Electronically Signed   By: Marlan Palau M.D.   On: 03/23/2015 11:26   Dg Abd 1 View  03/23/2015   CLINICAL DATA:  Lower left abdominal pain for 24 hr.  EXAM: ABDOMEN - 1 VIEW  COMPARISON:  CT 03/11/2015  FINDINGS: The biliary stent and the percutaneous cholecystostomy catheter appear unchanged in position. The abdominal gas pattern is negative for obstruction or perforation. No biliary or urinary calculi are evident. Severe lumbar degenerative changes incidentally noted.  IMPRESSION: Normal abdominal gas pattern. Unchanged positions of the percutaneous cholecystostomy drain and the biliary stent.   Electronically Signed   By: Ellery Plunk M.D.   On: 03/23/2015 03:59   Dg Chest Portable 1 View  03/22/2015   CLINICAL DATA:   Abdominal pain  EXAM: PORTABLE CHEST 1 VIEW  COMPARISON:  02/22/2015  FINDINGS: Stable right PICC. NG tube removed. Bibasilar atelectasis is stable. Upper lungs clear. Normal heart size. No pneumothorax.  IMPRESSION: Stable bibasilar atelectasis.   Electronically Signed   By: Jolaine Click M.D.   On: 03/22/2015 23:33    Scheduled Meds: . cefTRIAXone (ROCEPHIN) IVPB 2 gram/50 mL D5W (Pyxis)  2 g Intravenous Q24H  . insulin aspart  0-9 Units Subcutaneous Q4H  . insulin glargine  5 Units Subcutaneous Daily  . metronidazole  500 mg Intravenous Q8H  . [START ON 03/26/2015] pantoprazole (PROTONIX) IV  40 mg Intravenous Q12H   Continuous Infusions: . sodium chloride 100 mL/hr (03/24/15 1059)  . pantoprozole (PROTONIX) infusion 8 mg/hr (03/24/15 1100)   Antibiotics Given (last  72 hours)    Date/Time Action Medication Dose Rate   03/23/15 1705 Given  [Patient transfered to ICU.]   cefTRIAXone (ROCEPHIN) 2 g in dextrose 5 % 50 mL IVPB 2 g 100 mL/hr   03/23/15 1748 Given   metroNIDAZOLE (FLAGYL) IVPB 500 mg 500 mg 100 mL/hr   03/24/15 0212 Given   metroNIDAZOLE (FLAGYL) IVPB 500 mg 500 mg 100 mL/hr   03/24/15 1049 Given   metroNIDAZOLE (FLAGYL) IVPB 500 mg 500 mg 100 mL/hr      Principal Problem:   GIB (gastrointestinal bleeding) Active Problems:   Paroxysmal a-fib (HCC)   Acute blood loss anemia   Diabetes mellitus with renal complications (HCC)   Abdominal pain   CKD (chronic kidney disease), stage III   GI bleed    Time spent:    [facility]  [facility] Pager [phone]. If 7PM-7AM, please contact night-coverage at [email or web address], password [facility] 03/24/2015, 12:05 PM  LOS: 1 day

## 2015-03-25 ENCOUNTER — Encounter (HOSPITAL_COMMUNITY): Payer: Self-pay | Admitting: Gastroenterology

## 2015-03-25 LAB — COMPREHENSIVE METABOLIC PANEL
ALK PHOS: 67 U/L (ref 38–126)
ALT: 9 U/L — AB (ref 17–63)
ANION GAP: 5 (ref 5–15)
AST: 15 U/L (ref 15–41)
Albumin: 1.7 g/dL — ABNORMAL LOW (ref 3.5–5.0)
BUN: 10 mg/dL (ref 6–20)
CALCIUM: 7 mg/dL — AB (ref 8.9–10.3)
CO2: 27 mmol/L (ref 22–32)
CREATININE: 1.17 mg/dL (ref 0.61–1.24)
Chloride: 109 mmol/L (ref 101–111)
GFR calc non Af Amer: >60 mL/min (ref >60)
GLUCOSE: 110 mg/dL — AB (ref 65–99)
Potassium: 4.1 mmol/L (ref 3.5–5.1)
Sodium: 141 mmol/L (ref 135–145)
TOTAL PROTEIN: 5.1 g/dL — AB (ref 6.5–8.1)
Total Bilirubin: 0.5 mg/dL (ref 0.3–1.2)

## 2015-03-25 LAB — CBC
HCT: 28 % — ABNORMAL LOW (ref 39.0–52.0)
HEMOGLOBIN: 8.5 g/dL — AB (ref 13.0–17.0)
MCH: 29.7 pg (ref 26.0–34.0)
MCHC: 30.4 g/dL (ref 30.0–36.0)
MCV: 97.9 fL (ref 78.0–100.0)
PLATELETS: 222 10*3/uL (ref 150–400)
RBC: 2.86 MIL/uL — AB (ref 4.22–5.81)
RDW: 19.3 % — ABNORMAL HIGH (ref 11.5–15.5)
WBC: 8.6 10*3/uL (ref 4.0–10.5)

## 2015-03-25 LAB — GLUCOSE, CAPILLARY
GLUCOSE-CAPILLARY: 103 mg/dL — AB (ref 65–99)
GLUCOSE-CAPILLARY: 107 mg/dL — AB (ref 65–99)
GLUCOSE-CAPILLARY: 109 mg/dL — AB (ref 65–99)
GLUCOSE-CAPILLARY: 88 mg/dL (ref 65–99)
Glucose-Capillary: 113 mg/dL — ABNORMAL HIGH (ref 65–99)
Glucose-Capillary: 98 mg/dL (ref 65–99)
Glucose-Capillary: 104 mg/dL — ABNORMAL HIGH (ref 65–99)

## 2015-03-25 MED ORDER — PANTOPRAZOLE SODIUM 40 MG IV SOLR
40.0000 mg | Freq: Two times a day (BID) | INTRAVENOUS | Status: DC
  Administered 2015-03-25 – 2015-03-28 (×7): 40 mg via INTRAVENOUS
  Filled 2015-03-25 (×7): qty 40

## 2015-03-25 MED ORDER — LIP MEDEX EX OINT
TOPICAL_OINTMENT | CUTANEOUS | Status: DC | PRN
  Administered 2015-03-25: 16:00:00 via TOPICAL
  Filled 2015-03-25: qty 7

## 2015-03-25 MED ORDER — PROMETHAZINE HCL 25 MG/ML IJ SOLN
12.5000 mg | Freq: Four times a day (QID) | INTRAMUSCULAR | Status: DC | PRN
  Administered 2015-03-26 – 2015-03-28 (×4): 12.5 mg via INTRAVENOUS
  Filled 2015-03-25 (×4): qty 1

## 2015-03-25 NOTE — Progress Notes (Signed)
[facility] PROGRESS NOTE  Larry [name] EAV:[id number] DOB: 07-Jul-1945 DOA: 03/22/2015 PCP: [name], MD  Assessment/Plan: 1. LLQ Abdominal pain -suspect related to intra abd fluid collection/peritoneal irritation -h/o Bile leak s/p ERCP and stent placement -no acute findings on CT -s/p  US guided drainage 35 ml of cloudy bloody fluid, will FU culture -leave on current Abx pending cultures  2. Transient Upper GI Bleed -resolved, EGD with hiatal hernia otherwise unremarkable -continue PPI  3. H/o intra abd hematoma post cholecystectomy, culture positive for Klebsiella, has been on IV Ceftriaxone and FLagyl since 9/9 for 4 weeks, completed 4 weeks 10/6, continue Abx pending repeat Cx as above  4. Diabetes mellitus type 2 with renal complications - patient reduced dose of Lantus , SSI -stable  5. Paroxysmal atrial fibrillation  - presently off anticoagulation, in NSR, monitor.  6. CAD status post stenting  - denies any chest pain, stable  7. Acute renal failure - -resolved with hydration  8. Anemia of chronic disease -slight drop due to dilution most likely, monitor  DVT proph: SCDs, due to abd hematoma  Code Status: Full Code Family Communication: none at bedside Disposition Plan: home when improved   Consultants:  GI  [facility]S Dr.Wilson  Antibiotics:  Ceftriaxone/Flagyl  HPI/Subjective: Still with abd pain, tearful , frustrated abt this problem  Objective: Filed Vitals:   03/25/15 0700  BP: 115/67  Pulse: 87  Temp:   Resp: 12    Intake/Output Summary (Last 24 hours) at 03/25/15 1014 Last data filed at 03/25/15 0800  Gross per 24 hour  Intake   2535 ml  Output    791 ml  Net   1744 ml   Filed Weights   03/23/15 1709 03/24/15 0500  Weight: 103.1 kg (227 lb 4.7 oz) 106.7 kg (235 lb 3.7 oz)    Exam:   General:  AAOx3  Cardiovascular: S1S2/RRR  Respiratory: CTAB  Abdomen: soft, obese, Tender in LLQ , BS  diminished  Musculoskeletal: no edema c/c/  Data Reviewed: Basic Metabolic Panel:  Recent Labs Lab 03/22/15 2230 03/23/15 0336 03/23/15 1645 03/25/15 0400  NA 139  --  141 141  K 4.5  --  4.4 4.1  CL 103  --  105 109  CO2 27  --  30 27  GLUCOSE 173*  --  145* 110*  BUN 9  --  9 10  CREATININE 1.30*  --  1.15 1.17  CALCIUM 7.4*  --  7.0* 7.0*  MG  --  1.1*  --   --    Liver Function Tests:  Recent Labs Lab 03/22/15 2230 03/23/15 1645 03/25/15 0400  AST 34 22 15  ALT 11* 10* 9*  ALKPHOS 95 80 67  BILITOT 0.6 0.6 0.5  PROT 6.0* 5.8* 5.1*  ALBUMIN 2.3* 2.1* 1.7*    Recent Labs Lab 03/22/15 2230  LIPASE 28   No results for input(s): AMMONIA in the last 168 hours. CBC:  Recent Labs Lab 03/23/15 0330 03/23/15 1506 03/23/15 1828 03/23/15 2254 03/24/15 0222 03/25/15 0400  WBC 8.4 8.3 9.2 9.4 9.0 8.6  NEUTROABS 6.5  --   --   --   --   --   HGB 9.8* 9.1* 9.0* 9.4* 8.8* 8.5*  HCT 31.1* 29.3* 29.6* 30.3* 28.5* 28.0*  MCV 97.8 97.0 97.0 98.4 98.6 97.9  PLT 288 262 257 264 264 222   Cardiac Enzymes: No results for input(s): CKTOTAL, CKMB, CKMBINDEX, TROPONINI in the last 168 hours. BNP (last  3 results)  Recent Labs  03/10/15 1529  BNP 163.1*    ProBNP (last 3 results) No results for input(s): PROBNP in the last 8760 hours.  CBG:  Recent Labs Lab 03/24/15 1548 03/24/15 1902 03/24/15 2355 03/25/15 0404 03/25/15 0805  GLUCAP 107* 117* 114* 107* 109*    Recent Results (from the past 240 hour(s))  MRSA PCR Screening     Status: None   Collection Time: 03/23/15  4:03 PM  Result Value Ref Range Status   MRSA by PCR NEGATIVE NEGATIVE Final    Comment:        The GeneXpert MRSA Assay (FDA approved for NASAL specimens only), is one component of a comprehensive MRSA colonization surveillance program. It is not intended to diagnose MRSA infection nor to guide or monitor treatment for MRSA infections.   Culture, body fluid-bottle     Status:  None (Preliminary result)   Collection Time: 03/24/15 10:53 AM  Result Value Ref Range Status   Specimen Description FLUID ABDOMEN  Final   Special Requests AEB  Final   Culture PENDING  Incomplete   Report Status PENDING  Incomplete  Gram stain     Status: None   Collection Time: 03/24/15 10:53 AM  Result Value Ref Range Status   Specimen Description FLUID ABDOMEN  Final   Special Requests NONE  Final   Gram Stain   Final    FEW WBC PRESENT, PREDOMINANTLY PMN NO ORGANISMS SEEN Performed at [facility]    Report Status 03/24/2015 FINAL  Final     Studies: Ct Abdomen Pelvis Wo Contrast  03/23/2015   CLINICAL DATA:  Abdominal pain.  Cholecystectomy July 2016.  EXAM: CT ABDOMEN AND PELVIS WITHOUT CONTRAST  TECHNIQUE: Multidetector CT imaging of the abdomen and pelvis was performed following the standard protocol without IV contrast.  COMPARISON:  CT 03/11/2015, 02/16/2015  FINDINGS: Small right pleural effusion with compressive atelectasis right lower lobe.  Pigtail drainage catheter in the gallbladder fossa in good position. No significant residual fluid collection in this area.  Subcapsular fluid collection lateral and posterior to the liver is unchanged and contains gas. This has been aspirated in the past and is felt to be hematoma. This extends into the right colic gutter and into the pelvis and overall is unchanged from the prior study. Remainder of the liver is normal. Spleen is normal in size  Kidneys show no obstruction or mass.  Pancreas is negative.  Progressive ascites throughout the abdomen most prominent in the pelvis anteriorly.  Negative for bowel obstruction.  No bowel edema.  No mass or adenopathy.  Atherosclerotic aorta.  Lumbar degenerative change. Mild chronic fracture T9 unchanged. Grade 1 anterior slip L4-5 due to disc and facet degeneration.  IMPRESSION: Pigtail drainage catheter in the gallbladder fossa without residual fluid collection  Moderately large fluid  collection posterior lateral to the liver extending into the pelvis. This contains gas and is unchanged from the prior study. This has been aspirated with negative cultures.  Interval increase in ascites since the prior study.   Electronically Signed   By: Marlan Palau M.D.   On: 03/23/2015 11:26   US Paracentesis  03/24/2015   INDICATION: Ascites, request for paracentesis.  EXAM: ULTRASOUND-GUIDED PARACENTESIS  COMPARISON:  Paracentesis 02/16/2015.  MEDICATIONS: None.  COMPLICATIONS: None immediate  TECHNIQUE: Informed written consent was obtained from the patient after a discussion of the risks, benefits and alternatives to treatment. A timeout was performed prior to the initiation of  the procedure.  Initial ultrasound scanning demonstrates a small amount of ascites within the left midline abdominal region. The left midline abdomen was prepped and draped in the usual sterile fashion. 1% lidocaine was used for local anesthesia.  Under direct ultrasound guidance, a 19 gauge, 10-cm, Yueh catheter was introduced. An ultrasound image was saved for documentation purposed. The paracentesis was performed. The catheter was removed and a dressing was applied. The patient tolerated the procedure well without immediate post procedural complication.  FINDINGS: A total of approximately 35 ml of cloudy bloody fluid was removed. Samples were sent to the laboratory as requested by the clinical team.  IMPRESSION: Successful ultrasound-guided paracentesis yielding 35 ml of peritoneal fluid.  Read By:  Pattricia Boss PA-C   Electronically Signed   By: Gilmer Mor D.O.   On: 03/24/2015 12:08    Scheduled Meds: . cefTRIAXone (ROCEPHIN) IVPB 2 gram/50 mL D5W (Pyxis)  2 g Intravenous Q24H  . insulin aspart  0-9 Units Subcutaneous Q4H  . insulin glargine  5 Units Subcutaneous Daily  . metronidazole  500 mg Intravenous Q8H  . [START ON 03/26/2015] pantoprazole (PROTONIX) IV  40 mg Intravenous Q12H   Continuous Infusions: .  sodium chloride 75 mL/hr at 03/25/15 0800  . pantoprozole (PROTONIX) infusion 8 mg/hr (03/25/15 0800)   Antibiotics Given (last 72 hours)    Date/Time Action Medication Dose Rate   03/23/15 1705 Given  [Patient transfered to ICU.]   cefTRIAXone (ROCEPHIN) 2 g in dextrose 5 % 50 mL IVPB 2 g 100 mL/hr   03/23/15 1748 Given   metroNIDAZOLE (FLAGYL) IVPB 500 mg 500 mg 100 mL/hr   03/24/15 1610 Given   metroNIDAZOLE (FLAGYL) IVPB 500 mg 500 mg 100 mL/hr   03/24/15 1049 Given   metroNIDAZOLE (FLAGYL) IVPB 500 mg 500 mg 100 mL/hr   03/24/15 1527 Given   cefTRIAXone (ROCEPHIN) 2 g in dextrose 5 % 50 mL IVPB 2 g 100 mL/hr   03/24/15 1829 Given   metroNIDAZOLE (FLAGYL) IVPB 500 mg 500 mg 100 mL/hr   03/25/15 0300 Given   metroNIDAZOLE (FLAGYL) IVPB 500 mg 500 mg 100 mL/hr   03/25/15 0950 Given   metroNIDAZOLE (FLAGYL) IVPB 500 mg 500 mg 100 mL/hr      Principal Problem:   GIB (gastrointestinal bleeding) Active Problems:   Paroxysmal a-fib (HCC)   Acute blood loss anemia   Diabetes mellitus with renal complications (HCC)   Abdominal pain   CKD (chronic kidney disease), stage III   GI bleed    Time spent:    [facility]  [facility] Pager [phone]. If 7PM-7AM, please contact night-coverage at [email or web address], password [facility] 03/25/2015, 10:14 AM  LOS: 2 days

## 2015-03-25 NOTE — Progress Notes (Signed)
Patient ID: Larry [name], male   DOB: 1946-01-12, 69 y.o.   MRN: [id number] 1 Day Post-Op  Subjective: Pt still complains of horrible pain in his LLQ.  Some minimal nausea, but taking some of his clear liquids.  Tearful at times saying he is scared that we don't know why he has pain  Objective: Vital signs in last 24 hours: Temp:  [98.3 F (36.8 C)-98.9 F (37.2 C)] 98.6 F (37 C) (10/07 0400) Pulse Rate:  [87-116] 87 (10/07 0700) Resp:  [9-25] 12 (10/07 0700) BP: (88-130)/(45-83) 115/67 mmHg (10/07 0700) SpO2:  [92 %-99 %] 97 % (10/07 0700) Last BM Date: 03/24/15  Intake/Output from previous day: 10/06 0701 - 10/07 0700 In: 2810 [P.O.:360; I.V.:2100; IV Piggyback:350] Out: 976 [Urine:900; Drains:75; Stool:1] Intake/Output this shift: Total I/O In: 100 [I.V.:100] Out: -   PE: Abd: soft, tender in LLQ/left Flank.  There is no rash or evidence of blistering present on his skin.  +BS, ND, abdominal exam is quite benign with pain seemingly out of proportion to exam.  JP drain with bilious output.  Lab Results:   Recent Labs  03/24/15 0222 03/25/15 0400  WBC 9.0 8.6  HGB 8.8* 8.5*  HCT 28.5* 28.0*  PLT 264 222   BMET  Recent Labs  03/23/15 1645 03/25/15 0400  NA 141 141  K 4.4 4.1  CL 105 109  CO2 30 27  GLUCOSE 145* 110*  BUN 9 10  CREATININE 1.15 1.17  CALCIUM 7.0* 7.0*   PT/INR  Recent Labs  03/22/15 2321  LABPROT 14.7  INR 1.14   CMP     Component Value Date/Time   NA 141 03/25/2015 0400   K 4.1 03/25/2015 0400   CL 109 03/25/2015 0400   CO2 27 03/25/2015 0400   GLUCOSE 110* 03/25/2015 0400   BUN 10 03/25/2015 0400   CREATININE 1.17 03/25/2015 0400   CALCIUM 7.0* 03/25/2015 0400   PROT 5.1* 03/25/2015 0400   ALBUMIN 1.7* 03/25/2015 0400   AST 15 03/25/2015 0400   ALT 9* 03/25/2015 0400   ALKPHOS 67 03/25/2015 0400   BILITOT 0.5 03/25/2015 0400   GFRNONAA >60 03/25/2015 0400   GFRAA >60 03/25/2015 0400   Lipase     Component Value  Date/Time   LIPASE 28 03/22/2015 2230       Studies/Results: Ct Abdomen Pelvis Wo Contrast  03/23/2015   CLINICAL DATA:  Abdominal pain.  Cholecystectomy July 2016.  EXAM: CT ABDOMEN AND PELVIS WITHOUT CONTRAST  TECHNIQUE: Multidetector CT imaging of the abdomen and pelvis was performed following the standard protocol without IV contrast.  COMPARISON:  CT 03/11/2015, 02/16/2015  FINDINGS: Small right pleural effusion with compressive atelectasis right lower lobe.  Pigtail drainage catheter in the gallbladder fossa in good position. No significant residual fluid collection in this area.  Subcapsular fluid collection lateral and posterior to the liver is unchanged and contains gas. This has been aspirated in the past and is felt to be hematoma. This extends into the right colic gutter and into the pelvis and overall is unchanged from the prior study. Remainder of the liver is normal. Spleen is normal in size  Kidneys show no obstruction or mass.  Pancreas is negative.  Progressive ascites throughout the abdomen most prominent in the pelvis anteriorly.  Negative for bowel obstruction.  No bowel edema.  No mass or adenopathy.  Atherosclerotic aorta.  Lumbar degenerative change. Mild chronic fracture T9 unchanged. Grade 1 anterior slip L4-5 due to disc  and facet degeneration.  IMPRESSION: Pigtail drainage catheter in the gallbladder fossa without residual fluid collection  Moderately large fluid collection posterior lateral to the liver extending into the pelvis. This contains gas and is unchanged from the prior study. This has been aspirated with negative cultures.  Interval increase in ascites since the prior study.   Electronically Signed   By: Marlan Palau M.D.   On: 03/23/2015 11:26   US Paracentesis  03/24/2015   INDICATION: Ascites, request for paracentesis.  EXAM: ULTRASOUND-GUIDED PARACENTESIS  COMPARISON:  Paracentesis 02/16/2015.  MEDICATIONS: None.  COMPLICATIONS: None immediate  TECHNIQUE:  Informed written consent was obtained from the patient after a discussion of the risks, benefits and alternatives to treatment. A timeout was performed prior to the initiation of the procedure.  Initial ultrasound scanning demonstrates a small amount of ascites within the left midline abdominal region. The left midline abdomen was prepped and draped in the usual sterile fashion. 1% lidocaine was used for local anesthesia.  Under direct ultrasound guidance, a 19 gauge, 10-cm, Yueh catheter was introduced. An ultrasound image was saved for documentation purposed. The paracentesis was performed. The catheter was removed and a dressing was applied. The patient tolerated the procedure well without immediate post procedural complication.  FINDINGS: A total of approximately 35 ml of cloudy bloody fluid was removed. Samples were sent to the laboratory as requested by the clinical team.  IMPRESSION: Successful ultrasound-guided paracentesis yielding 35 ml of peritoneal fluid.  Read By:  Pattricia Boss PA-C   Electronically Signed   By: Gilmer Mor D.O.   On: 03/24/2015 12:08    Anti-infectives: Anti-infectives    Start     Dose/Rate Route Frequency Ordered Stop   03/23/15 1800  metroNIDAZOLE (FLAGYL) IVPB 500 mg     500 mg 100 mL/hr over 60 Minutes Intravenous Every 8 hours 03/23/15 1640     03/23/15 1430  cefTRIAXone (ROCEPHIN) 2 g in dextrose 5 % 50 mL IVPB     2 g 100 mL/hr over 30 Minutes Intravenous Every 24 hours 03/23/15 1422     03/23/15 0745  metroNIDAZOLE (FLAGYL) IVPB 500 mg  Status:  Discontinued     500 mg 100 mL/hr over 60 Minutes Intravenous Every 6 hours 03/23/15 0744 03/23/15 1335       Assessment/Plan  N/v/LLQ pain Bile leak s/p drain and stent  -EGD was negative yesterday -IR aspirated this fluid in his pelvic.  Appeared to be cloudy bloody, but not bilious.  Await cultures. -no evidence of rash or anything else obvious that may be causing his LLQ pain. His exam is benign and pain  seems out of proportion to exam.  His pain also seems more flank that true intra-abdominal.  Unclear what is causing his pain. -he is on clears and wanted to stay on this today.  I did write his diet could be advanced as tolerated, so he can request advancement as he desires.  -surgically stable for a floor. -cont with PT/OT for mobilization so he can hopefully return home soon.   LOS: 2 days    Michele Judy E 03/25/2015, 10:11 AM Pager: [phone]

## 2015-03-26 DIAGNOSIS — E0822 Diabetes mellitus due to underlying condition with diabetic chronic kidney disease: Secondary | ICD-10-CM

## 2015-03-26 LAB — COMPREHENSIVE METABOLIC PANEL
ALBUMIN: 1.8 g/dL — AB (ref 3.5–5.0)
ALT: 7 U/L — AB (ref 17–63)
AST: 16 U/L (ref 15–41)
Alkaline Phosphatase: 65 U/L (ref 38–126)
Anion gap: 6 (ref 5–15)
BUN: 9 mg/dL (ref 6–20)
CHLORIDE: 109 mmol/L (ref 101–111)
CO2: 26 mmol/L (ref 22–32)
CREATININE: 1.08 mg/dL (ref 0.61–1.24)
Calcium: 7.2 mg/dL — ABNORMAL LOW (ref 8.9–10.3)
GFR calc Af Amer: >60 mL/min (ref >60)
GFR calc non Af Amer: >60 mL/min (ref >60)
GLUCOSE: 84 mg/dL (ref 65–99)
POTASSIUM: 3.9 mmol/L (ref 3.5–5.1)
Sodium: 141 mmol/L (ref 135–145)
Total Bilirubin: 0.7 mg/dL (ref 0.3–1.2)
Total Protein: 5.1 g/dL — ABNORMAL LOW (ref 6.5–8.1)

## 2015-03-26 LAB — CBC WITH DIFFERENTIAL/PLATELET
BASOS ABS: 0 10*3/uL (ref 0.0–0.1)
Basophils Relative: 0 %
EOS PCT: 1 %
Eosinophils Absolute: 0.1 10*3/uL (ref 0.0–0.7)
HEMATOCRIT: 28.2 % — AB (ref 39.0–52.0)
Hemoglobin: 8.7 g/dL — ABNORMAL LOW (ref 13.0–17.0)
LYMPHS PCT: 15 %
Lymphs Abs: 1.1 10*3/uL (ref 0.7–4.0)
MCH: 30.1 pg (ref 26.0–34.0)
MCHC: 30.9 g/dL (ref 30.0–36.0)
MCV: 97.6 fL (ref 78.0–100.0)
MONO ABS: 0.7 10*3/uL (ref 0.1–1.0)
Monocytes Relative: 10 %
NEUTROS ABS: 5.3 10*3/uL (ref 1.7–7.7)
Neutrophils Relative %: 74 %
PLATELETS: 234 10*3/uL (ref 150–400)
RBC: 2.89 MIL/uL — AB (ref 4.22–5.81)
RDW: 19 % — AB (ref 11.5–15.5)
WBC: 7.1 10*3/uL (ref 4.0–10.5)

## 2015-03-26 LAB — GLUCOSE, CAPILLARY
GLUCOSE-CAPILLARY: 91 mg/dL (ref 65–99)
GLUCOSE-CAPILLARY: 114 mg/dL — AB (ref 65–99)
Glucose-Capillary: 78 mg/dL (ref 65–99)
Glucose-Capillary: 85 mg/dL (ref 65–99)
Glucose-Capillary: 107 mg/dL — ABNORMAL HIGH (ref 65–99)

## 2015-03-26 MED ORDER — ENSURE ENLIVE PO LIQD
237.0000 mL | Freq: Two times a day (BID) | ORAL | Status: DC
  Administered 2015-03-28: 237 mL via ORAL

## 2015-03-26 MED ORDER — LORAZEPAM 2 MG/ML IJ SOLN
0.5000 mg | Freq: Four times a day (QID) | INTRAMUSCULAR | Status: DC | PRN
  Administered 2015-03-26 – 2015-03-29 (×6): 0.5 mg via INTRAVENOUS
  Filled 2015-03-26 (×6): qty 1

## 2015-03-26 NOTE — Progress Notes (Signed)
[facility] PROGRESS NOTE  Larry [name]:[id number] DOB: 1945-08-20 DOA: 03/22/2015 PCP: [name], MD  Assessment/Plan: 1. LLQ Abdominal pain -suspect related to intra abd fluid collection/peritoneal irritation -h/o Bile leak s/p ERCP and stent placement -no acute findings on CT 10/5 -s/p  US guided drainage 35 ml of cloudy bloody fluid, Cultures-NGTD -? Stop current Abx, does he need a repeat CT with contrast the one on 10/5 was without, per [facility]CS -very poor/almost no PO intake for while now  2. Transient Upper GI Bleed -resolved, EGD with hiatal hernia otherwise unremarkable -continue PPI  3. H/o intra abd hematoma post cholecystectomy, culture positive for Klebsiella, has been on IV Ceftriaxone and FLagyl since 9/9 for 4 weeks, completed 4 weeks 10/6, repeat Cx with no growth -Can Abx be stopped, will d/w CCS  4. Diabetes mellitus type 2 with renal complications - patient reduced dose of Lantus , SSI -CBGs soft, will stop Lantus  5. Paroxysmal atrial fibrillation  - presently off anticoagulation, in NSR, monitor.  6. CAD status post stenting  - denies any chest pain, stable  7. Acute renal failure - -resolved with hydration  8. Anemia of chronic disease -slight drop due to dilution most likely, monitor  9. Protein calorie malnutrition -pt would like to try ensure, will try this despite H/o DM  DVT proph: SCDs, due to abd hematoma  Code Status: Full Code Family Communication: none at bedside Disposition Plan: home when improved   Consultants:  GI  [facility] Dr.Wilson  Antibiotics:  Ceftriaxone/Flagyl  HPI/Subjective: Still with abd pain,  frustrated abt this problem, not able to eat due to pain and nausea  Objective: Filed Vitals:   03/26/15 0403  BP: 123/61  Pulse: 101  Temp: 98.2 F (36.8 C)  Resp: 16    Intake/Output Summary (Last 24 hours) at 03/26/15 0813 Last data filed at 03/25/15 2313  Gross per 24 hour  Intake    300 ml   Output    325 ml  Net    -25 ml   Filed Weights   03/23/15 1709 03/24/15 0500 03/26/15 0403  Weight: 103.1 kg (227 lb 4.7 oz) 106.7 kg (235 lb 3.7 oz) 110.6 kg (243 lb 13.3 oz)    Exam:   General:  AAOx3  Cardiovascular: S1S2/RRR  Respiratory: CTAB  Abdomen: soft, obese, Tender in LLQ , BS diminished  Musculoskeletal: no edema c/c/  Data Reviewed: Basic Metabolic Panel:  Recent Labs Lab 03/22/15 2230 03/23/15 0336 03/23/15 1645 03/25/15 0400  NA 139  --  141 141  K 4.5  --  4.4 4.1  CL 103  --  105 109  CO2 27  --  30 27  GLUCOSE 173*  --  145* 110*  BUN 9  --  9 10  CREATININE 1.30*  --  1.15 1.17  CALCIUM 7.4*  --  7.0* 7.0*  MG  --  1.1*  --   --    Liver Function Tests:  Recent Labs Lab 03/22/15 2230 03/23/15 1645 03/25/15 0400  AST 34 22 15  ALT 11* 10* 9*  ALKPHOS 95 80 67  BILITOT 0.6 0.6 0.5  PROT 6.0* 5.8* 5.1*  ALBUMIN 2.3* 2.1* 1.7*    Recent Labs Lab 03/22/15 2230  LIPASE 28   No results for input(s): AMMONIA in the last 168 hours. CBC:  Recent Labs Lab 03/23/15 0330 03/23/15 1506 03/23/15 1828 03/23/15 2254 03/24/15 0222 03/25/15 0400  WBC 8.4 8.3 9.2 9.4 9.0 8.6  NEUTROABS 6.5  --   --   --   --   --   HGB 9.8* 9.1* 9.0* 9.4* 8.8* 8.5*  HCT 31.1* 29.3* 29.6* 30.3* 28.5* 28.0*  MCV 97.8 97.0 97.0 98.4 98.6 97.9  PLT 288 262 257 264 264 222   Cardiac Enzymes: No results for input(s): CKTOTAL, CKMB, CKMBINDEX, TROPONINI in the last 168 hours. BNP (last 3 results)  Recent Labs  03/10/15 1529  BNP 163.1*    ProBNP (last 3 results) No results for input(s): PROBNP in the last 8760 hours.  CBG:  Recent Labs Lab 03/25/15 1631 03/25/15 1945 03/25/15 2358 03/26/15 0400 03/26/15 0800  GLUCAP 104* 103* 88 91 78    Recent Results (from the past 240 hour(s))  MRSA PCR Screening     Status: None   Collection Time: 03/23/15  4:03 PM  Result Value Ref Range Status   MRSA by PCR NEGATIVE NEGATIVE Final     Comment:        The GeneXpert MRSA Assay (FDA approved for NASAL specimens only), is one component of a comprehensive MRSA colonization surveillance program. It is not intended to diagnose MRSA infection nor to guide or monitor treatment for MRSA infections.   Culture, body fluid-bottle     Status: None (Preliminary result)   Collection Time: 03/24/15 10:53 AM  Result Value Ref Range Status   Specimen Description FLUID ABDOMEN  Final   Special Requests AEB  Final   Culture   Final    NO GROWTH < 24 HOURS Performed at [facility]    Report Status PENDING  Incomplete  Gram stain     Status: None   Collection Time: 03/24/15 10:53 AM  Result Value Ref Range Status   Specimen Description FLUID ABDOMEN  Final   Special Requests NONE  Final   Gram Stain   Final    FEW WBC PRESENT, PREDOMINANTLY PMN NO ORGANISMS SEEN Performed at [facility]    Report Status 03/24/2015 FINAL  Final     Studies: US Paracentesis  03/24/2015   INDICATION: Ascites, request for paracentesis.  EXAM: ULTRASOUND-GUIDED PARACENTESIS  COMPARISON:  Paracentesis 02/16/2015.  MEDICATIONS: None.  COMPLICATIONS: None immediate  TECHNIQUE: Informed written consent was obtained from the patient after a discussion of the risks, benefits and alternatives to treatment. A timeout was performed prior to the initiation of the procedure.  Initial ultrasound scanning demonstrates a small amount of ascites within the left midline abdominal region. The left midline abdomen was prepped and draped in the usual sterile fashion. 1% lidocaine was used for local anesthesia.  Under direct ultrasound guidance, a 19 gauge, 10-cm, Yueh catheter was introduced. An ultrasound image was saved for documentation purposed. The paracentesis was performed. The catheter was removed and a dressing was applied. The patient tolerated the procedure well without immediate post procedural complication.  FINDINGS: A total of  approximately 35 ml of cloudy bloody fluid was removed. Samples were sent to the laboratory as requested by the clinical team.  IMPRESSION: Successful ultrasound-guided paracentesis yielding 35 ml of peritoneal fluid.  Read By:  Pattricia Boss PA-C   Electronically Signed   By: Gilmer Mor D.O.   On: 03/24/2015 12:08    Scheduled Meds: . cefTRIAXone (ROCEPHIN) IVPB 2 gram/50 mL D5W (Pyxis)  2 g Intravenous Q24H  . insulin aspart  0-9 Units Subcutaneous Q4H  . metronidazole  500 mg Intravenous Q8H  . pantoprazole (PROTONIX) IV  40 mg Intravenous Q12H  Continuous Infusions: . sodium chloride 75 mL/hr at 03/25/15 1100   Antibiotics Given (last 72 hours)    Date/Time Action Medication Dose Rate   03/23/15 1705 Given  [Patient transfered to ICU.]   cefTRIAXone (ROCEPHIN) 2 g in dextrose 5 % 50 mL IVPB 2 g 100 mL/hr   03/23/15 1748 Given   metroNIDAZOLE (FLAGYL) IVPB 500 mg 500 mg 100 mL/hr   03/24/15 0212 Given   metroNIDAZOLE (FLAGYL) IVPB 500 mg 500 mg 100 mL/hr   03/24/15 1049 Given   metroNIDAZOLE (FLAGYL) IVPB 500 mg 500 mg 100 mL/hr   03/24/15 1527 Given   cefTRIAXone (ROCEPHIN) 2 g in dextrose 5 % 50 mL IVPB 2 g 100 mL/hr   03/24/15 1829 Given   metroNIDAZOLE (FLAGYL) IVPB 500 mg 500 mg 100 mL/hr   03/25/15 0300 Given   metroNIDAZOLE (FLAGYL) IVPB 500 mg 500 mg 100 mL/hr   03/25/15 0950 Given   metroNIDAZOLE (FLAGYL) IVPB 500 mg 500 mg 100 mL/hr   03/25/15 1426 Given   cefTRIAXone (ROCEPHIN) 2 g in dextrose 5 % 50 mL IVPB 2 g 100 mL/hr   03/25/15 1720 Given   metroNIDAZOLE (FLAGYL) IVPB 500 mg 500 mg 100 mL/hr   03/26/15 0155 Given   metroNIDAZOLE (FLAGYL) IVPB 500 mg 500 mg 100 mL/hr      Principal Problem:   GIB (gastrointestinal bleeding) Active Problems:   Paroxysmal a-fib (HCC)   Acute blood loss anemia   Diabetes mellitus with renal complications (HCC)   Abdominal pain   CKD (chronic kidney disease), stage III   GI bleed    Time spent:     [facility]  [facility] Pager [phone]. If 7PM-7AM, please contact night-coverage at [email or web address], password [facility] 03/26/2015, 8:13 AM  LOS: 3 days

## 2015-03-26 NOTE — Progress Notes (Signed)
General Surgery Note  LOS: 3 days  POD -  2 Days Post-Op  Assessment/Plan: 1. Bile leak  Lap chole with IOC - 01/04/2015 - Wilson Readmitted 03/23/2015 - Post op course complicated by intaabdominal abscess - has drain in RUQ.  (though no drainage recorded yesterday)  On Rocephin/flagyl - 10/5 >>>>  2.  LLQ pain  Unclear what is causing this.  There is really nothing there on CT scan.    US aspiration yesterday drew some fluid off - cultures are pending.  3.  ESOPHAGOGASTRODUODENOSCOPY (EGD)03/24/2015 - Hung  Biliary stent in place, no source of GI bleed  He had vomited blood prior to readmission  4. DM Glucose - 110 - 03/26/2015 5. CAD History of A. Fib 6. Anemia Hgb - 8.5 - 03/26/2015 7.  DVT prophylaxis - chemoprophylaxis on hold   Principal Problem:   GIB (gastrointestinal bleeding) Active Problems:   Paroxysmal a-fib (HCC)   Acute blood loss anemia   Diabetes mellitus with renal complications (HCC)   Abdominal pain   CKD (chronic kidney disease), stage III   GI bleed  Subjective:  He says it hurts to swallow.  And his main complaint is LLQ pain.   Sister at bedside.  She did no offer much insight into his complaints.   Objective:   Filed Vitals:   03/26/15 0403  BP: 123/61  Pulse: 101  Temp: 98.2 F (36.8 C)  Resp: 16     Intake/Output from previous day:  10/07 0701 - 10/08 0700 In: 400 [I.V.:400] Out: 325 [Urine:325]  Intake/Output this shift:      Physical Exam:   General: Obese WM who is alert and oriented.    HEENT: Normal. Pupils equal. .   Lungs: Clear   Abdomen: Soft, but tender in LLQ.  No guarding Drain RUQ - not recorded yesterday   Lab Results:    Recent Labs  03/24/15 0222 03/25/15 0400  WBC 9.0 8.6  HGB 8.8* 8.5*  HCT 28.5* 28.0*  PLT 264 222    BMET   Recent Labs  03/23/15 1645 03/25/15 0400  NA 141 141  K 4.4 4.1  CL 105 109  CO2 30 27  GLUCOSE 145*  110*  BUN 9 10  CREATININE 1.15 1.17  CALCIUM 7.0* 7.0*    PT/INR  No results for input(s): LABPROT, INR in the last 72 hours.  ABG  No results for input(s): PHART, HCO3 in the last 72 hours.  Invalid input(s): PCO2, PO2   Studies/Results:  US Paracentesis  03/24/2015   INDICATION: Ascites, request for paracentesis.  EXAM: ULTRASOUND-GUIDED PARACENTESIS  COMPARISON:  Paracentesis 02/16/2015.  MEDICATIONS: None.  COMPLICATIONS: None immediate  TECHNIQUE: Informed written consent was obtained from the patient after a discussion of the risks, benefits and alternatives to treatment. A timeout was performed prior to the initiation of the procedure.  Initial ultrasound scanning demonstrates a small amount of ascites within the left midline abdominal region. The left midline abdomen was prepped and draped in the usual sterile fashion. 1% lidocaine was used for local anesthesia.  Under direct ultrasound guidance, a 19 gauge, 10-cm, Yueh catheter was introduced. An ultrasound image was saved for documentation purposed. The paracentesis was performed. The catheter was removed and a dressing was applied. The patient tolerated the procedure well without immediate post procedural complication.  FINDINGS: A total of approximately 35 ml of cloudy bloody fluid was removed. Samples were sent to the laboratory as requested by the clinical team.  IMPRESSION:  Successful ultrasound-guided paracentesis yielding 35 ml of peritoneal fluid.  Read By:  Pattricia Boss PA-C   Electronically Signed   By: Gilmer Mor D.O.   On: 03/24/2015 12:08     Anti-infectives:   Anti-infectives    Start     Dose/Rate Route Frequency Ordered Stop   03/23/15 1800  metroNIDAZOLE (FLAGYL) IVPB 500 mg     500 mg 100 mL/hr over 60 Minutes Intravenous Every 8 hours 03/23/15 1640     03/23/15 1430  cefTRIAXone (ROCEPHIN) 2 g in dextrose 5 % 50 mL IVPB     2 g 100 mL/hr over 30 Minutes Intravenous Every 24 hours 03/23/15 1422     03/23/15  0745  metroNIDAZOLE (FLAGYL) IVPB 500 mg  Status:  Discontinued     500 mg 100 mL/hr over 60 Minutes Intravenous Every 6 hours 03/23/15 0744 03/23/15 1335      Ovidio Kin, MD, FACS Pager: [phone] [facility] Office: [phone] 03/26/2015

## 2015-03-27 DIAGNOSIS — R188 Other ascites: Secondary | ICD-10-CM | POA: Insufficient documentation

## 2015-03-27 LAB — BASIC METABOLIC PANEL
ANION GAP: 5 (ref 5–15)
BUN: 6 mg/dL (ref 6–20)
CALCIUM: 6.7 mg/dL — AB (ref 8.9–10.3)
CO2: 25 mmol/L (ref 22–32)
Chloride: 113 mmol/L — ABNORMAL HIGH (ref 101–111)
Creatinine, Ser: 0.96 mg/dL (ref 0.61–1.24)
GLUCOSE: 111 mg/dL — AB (ref 65–99)
Potassium: 3.5 mmol/L (ref 3.5–5.1)
Sodium: 143 mmol/L (ref 135–145)

## 2015-03-27 LAB — CBC
HCT: 26.8 % — ABNORMAL LOW (ref 39.0–52.0)
Hemoglobin: 8.2 g/dL — ABNORMAL LOW (ref 13.0–17.0)
MCH: 30 pg (ref 26.0–34.0)
MCHC: 30.6 g/dL (ref 30.0–36.0)
MCV: 98.2 fL (ref 78.0–100.0)
PLATELETS: 213 10*3/uL (ref 150–400)
RBC: 2.73 MIL/uL — ABNORMAL LOW (ref 4.22–5.81)
RDW: 19 % — AB (ref 11.5–15.5)
WBC: 5.8 10*3/uL (ref 4.0–10.5)

## 2015-03-27 LAB — GLUCOSE, CAPILLARY
GLUCOSE-CAPILLARY: 102 mg/dL — AB (ref 65–99)
GLUCOSE-CAPILLARY: 118 mg/dL — AB (ref 65–99)
Glucose-Capillary: 108 mg/dL — ABNORMAL HIGH (ref 65–99)
Glucose-Capillary: 109 mg/dL — ABNORMAL HIGH (ref 65–99)
Glucose-Capillary: 116 mg/dL — ABNORMAL HIGH (ref 65–99)
Glucose-Capillary: 117 mg/dL — ABNORMAL HIGH (ref 65–99)
Glucose-Capillary: 120 mg/dL — ABNORMAL HIGH (ref 65–99)

## 2015-03-27 MED ORDER — HYDROMORPHONE HCL 1 MG/ML IJ SOLN
1.0000 mg | INTRAMUSCULAR | Status: DC | PRN
  Administered 2015-03-27 (×3): 1 mg via INTRAVENOUS
  Administered 2015-03-28 (×2): 2 mg via INTRAVENOUS
  Administered 2015-03-29: 1 mg via INTRAVENOUS
  Filled 2015-03-27 (×3): qty 1
  Filled 2015-03-27: qty 2
  Filled 2015-03-27: qty 1
  Filled 2015-03-27: qty 2

## 2015-03-27 NOTE — Progress Notes (Addendum)
General Surgery Note  LOS: 4 days  POD -  3 Days Post-Op  Assessment/Plan: 1. Bile leak  Lap chole with IOC - 01/04/2015 - Wilson Readmitted 03/23/2015 - Post op course complicated by intaabdominal abscess - has drain in RUQ.   On Rocephin/flagyl - 10/5 >>>>  2.  LLQ pain  Unclear what is causing this.  There is nothing impressive on CT scan.    US aspiration 10/6 drew some fluid off - cultures are negative so far  This is his primary problem right now.  3.  ESOPHAGOGASTRODUODENOSCOPY (EGD)03/24/2015 - Hung  Biliary stent in place, no source of GI bleed  He had vomited blood prior to readmission - but no further bleeding since admission. 4. DM Glucose - 111 - 03/27/2015 5. CAD History of A. Fib 6. Anemia Hgb - 8.2 - 03/27/2015 7.  DVT prophylaxis - chemoprophylaxis on hold due to presentation of GI bleed   Principal Problem:   GIB (gastrointestinal bleeding) Active Problems:   Paroxysmal a-fib (HCC)   Acute blood loss anemia   Diabetes mellitus with renal complications (HCC)   Abdominal pain   CKD (chronic kidney disease), stage III   GI bleed  Subjective:  His main complaint is LLQ pain.  He needs to ambulate more.  Sister at bedside.  Objective:   Filed Vitals:   03/27/15 0523  BP: 104/47  Pulse: 104  Temp: 98.4 F (36.9 C)  Resp: 16     Intake/Output from previous day:  10/08 0701 - 10/09 0700 In: -  Out: 960 [Urine:900; Drains:60]  Intake/Output this shift:      Physical Exam:   General: Obese WM who is alert and oriented.    HEENT: Normal. Pupils equal. .   Lungs: Clear   Abdomen: Soft, but tender in LLQ.  Has good BS.  Drain RUQ - 60 cc recorded.   Lab Results:     Recent Labs  03/26/15 0900 03/27/15 0436  WBC 7.1 5.8  HGB 8.7* 8.2*  HCT 28.2* 26.8*  PLT 234 213    BMET    Recent Labs  03/26/15 0900 03/27/15 0436  NA 141 143  K 3.9 3.5  CL 109 113*  CO2 26 25   GLUCOSE 84 111*  BUN 9 6  CREATININE 1.08 0.96  CALCIUM 7.2* 6.7*    PT/INR  No results for input(s): LABPROT, INR in the last 72 hours.  ABG  No results for input(s): PHART, HCO3 in the last 72 hours.  Invalid input(s): PCO2, PO2   Studies/Results:  No results found.   Anti-infectives:   Anti-infectives    Start     Dose/Rate Route Frequency Ordered Stop   03/23/15 1800  metroNIDAZOLE (FLAGYL) IVPB 500 mg     500 mg 100 mL/hr over 60 Minutes Intravenous Every 8 hours 03/23/15 1640     03/23/15 1430  cefTRIAXone (ROCEPHIN) 2 g in dextrose 5 % 50 mL IVPB     2 g 100 mL/hr over 30 Minutes Intravenous Every 24 hours 03/23/15 1422     03/23/15 0745  metroNIDAZOLE (FLAGYL) IVPB 500 mg  Status:  Discontinued     500 mg 100 mL/hr over 60 Minutes Intravenous Every 6 hours 03/23/15 0744 03/23/15 1335      Ovidio Kin, MD, FACS Pager: [phone] [facility] Office: [phone] 03/27/2015

## 2015-03-27 NOTE — Progress Notes (Signed)
Patient ID: Larry [name], male   DOB: [birth year], 69 y.o.   MRN: [id number]    Referring Physician(s): [facility]  Chief Complaint: GB fossa abscess, bile leak  Subjective:  Pt doing ok; denies N/V or abnormal bleeding; has some intermittent LLQ discomfort but improving; moving bowels, passing gas  Allergies: Review of patient's allergies indicates no known allergies.  Medications: Prior to Admission medications   Medication Sig Start Date End Date Taking? Authorizing Provider  antiseptic oral rinse (CPC / CETYLPYRIDINIUM CHLORIDE 0.05%) 0.05 % LIQD solution 7 mLs by Mouth Rinse route 2 times daily at 12 noon and 4 pm. 02/26/15  Yes Elease Etienne, MD  atorvastatin (LIPITOR) 80 MG tablet Take 80 mg by mouth at bedtime.    Yes Historical Provider, MD  cefTRIAXone 2 g in dextrose 5 % 50 mL Inject 2 g into the vein daily. For 4 weeks from 02/25/2015. 03/14/15 04/09/15 Yes [name], MD  feeding supplement, ENSURE ENLIVE, (ENSURE ENLIVE) LIQD Take 237 mLs by mouth 2 (two) times daily between meals. 02/26/15  Yes Elease Etienne, MD  furosemide (LASIX) 40 MG tablet Take 40 mg by mouth daily.   Yes Historical Provider, MD  glucose 4 GM chewable tablet Chew 1 tablet by mouth as needed for low blood sugar (only if BS IS BELOW 70).   Yes Historical Provider, MD  insulin glargine (LANTUS) 100 UNIT/ML injection Inject 0.15 mLs (15 Units total) into the skin daily. Patient taking differently: Inject 40 Units into the skin at bedtime.  02/26/15  Yes Elease Etienne, MD  lactobacillus acidophilus (BACID) TABS tablet Take 1 tablet by mouth 2 (two) times daily.   Yes Historical Provider, MD  levalbuterol (XOPENEX) 0.63 MG/3ML nebulizer solution Take 3 mLs (0.63 mg total) by nebulization every 6 (six) hours as needed for wheezing or shortness of breath. 02/26/15  Yes Elease Etienne, MD  LORazepam (ATIVAN) 0.5 MG tablet Take 0.5 mg by mouth at bedtime.   Yes Historical Provider, MD  magnesium oxide (MAG-OX)  400 MG tablet Take 400 mg by mouth 2 (two) times daily.    Yes Historical Provider, MD  Melatonin 3 MG TABS Take 3 mg by mouth at bedtime.   Yes Historical Provider, MD  ondansetron (ZOFRAN-ODT) 4 MG disintegrating tablet Take 1 tablet (4 mg total) by mouth every 6 (six) hours as needed for nausea. 03/14/15  Yes [name], MD  pantoprazole (PROTONIX) 40 MG tablet Take 40 mg by mouth daily before breakfast.    Yes Historical Provider, MD  potassium chloride SA (K-DUR,KLOR-CON) 20 MEQ tablet Take 1 tablet (20 mEq total) by mouth daily. 03/14/15  Yes [name], MD  vitamin C (ASCORBIC ACID) 500 MG tablet Take 500 mg by mouth 2 (two) times daily.   Yes Historical Provider, MD  zinc sulfate 220 MG capsule Take 220 mg by mouth every morning.   Yes Historical Provider, MD  acetaminophen (TYLENOL) 325 MG tablet Take 2 tablets (650 mg total) by mouth every 6 (six) hours as needed for mild pain, moderate pain, fever or headache. Patient taking differently: Take 650 mg by mouth every 6 (six) hours as needed (For pain or fever.).  02/26/15   Elease Etienne, MD  insulin aspart (NOVOLOG) 100 UNIT/ML injection Inject 0-9 Units into the skin 3 (three) times daily with meals. CBG < 70: implement hypoglycemia protocol CBG 70 - 120: 0 units CBG 121 - 150: 1 unit CBG 151 - 200: 2 units CBG  201 - 250: 3 units CBG 251 - 300: 5 units CBG 301 - 350: 7 units CBG 351 - 400: 9 units CBG > 400: call MD. Patient not taking: Reported on 03/22/2015 02/26/15   Elease Etienne, MD  traMADol (ULTRAM) 50 MG tablet Take 50 mg by mouth every 6 (six) hours as needed (For pain.).    Historical Provider, MD     Vital Signs: BP 104/47 mmHg  Pulse 104  Temp(Src) 98.4 F (36.9 C) (Oral)  Resp 16  Ht  (1.702 m)  Wt 243 lb 13.3 oz (110.6 kg)  BMI 38.18 kg/m2  SpO2 90%  Physical Exam GB fossa drain intact, insertion site ok,NT, output 60 cc golden yellow fluid, drain flushed with sterile NS without difficulty; puncture  site from prior paracentesis left mid abd region clean and dry, no hematoma or bruising  Imaging: US Paracentesis  03/24/2015   INDICATION: Ascites, request for paracentesis.  EXAM: ULTRASOUND-GUIDED PARACENTESIS  COMPARISON:  Paracentesis 02/16/2015.  MEDICATIONS: None.  COMPLICATIONS: None immediate  TECHNIQUE: Informed written consent was obtained from the patient after a discussion of the risks, benefits and alternatives to treatment. A timeout was performed prior to the initiation of the procedure.  Initial ultrasound scanning demonstrates a small amount of ascites within the left midline abdominal region. The left midline abdomen was prepped and draped in the usual sterile fashion. 1% lidocaine was used for local anesthesia.  Under direct ultrasound guidance, a 19 gauge, 10-cm, Yueh catheter was introduced. An ultrasound image was saved for documentation purposed. The paracentesis was performed. The catheter was removed and a dressing was applied. The patient tolerated the procedure well without immediate post procedural complication.  FINDINGS: A total of approximately 35 ml of cloudy bloody fluid was removed. Samples were sent to the laboratory as requested by the clinical team.  IMPRESSION: Successful ultrasound-guided paracentesis yielding 35 ml of peritoneal fluid.  Read By:  Pattricia Boss PA-C   Electronically Signed   By: Gilmer Mor D.O.   On: 03/24/2015 12:08    Labs:  CBC:  Recent Labs  03/24/15 0222 03/25/15 0400 03/26/15 0900 03/27/15 0436  WBC 9.0 8.6 7.1 5.8  HGB 8.8* 8.5* 8.7* 8.2*  HCT 28.5* 28.0* 28.2* 26.8*  PLT 264 222 234 213    COAGS:  Recent Labs  01/19/15 1613 01/23/15 0754  02/20/15 0450 02/21/15 0555 02/22/15 0515 02/23/15 0550 03/10/15 1333 03/22/15 2321  INR 1.46 1.32  --   --   --   --   --  1.39 1.14  APTT 35 38*  < > 37 --   --   < > = values in this interval not displayed.  BMP:  Recent Labs  03/23/15 1645 03/25/15 0400  03/26/15 0900 03/27/15 0436  NA 141 141 141 143  K 4.4 4.1 3.9 3.5  CL 105 109 109 113*  CO2 GLUCOSE 145* 110* 84 111*  BUN CALCIUM 7.0* 7.0* 7.2* 6.7*  CREATININE 1.15 1.17 1.08 0.96  GFRNONAA >60 >60 >60 >60  GFRAA >60 >60 >60 >60    LIVER FUNCTION TESTS:  Recent Labs  03/22/15 2230 03/23/15 1645 03/25/15 0400 03/26/15 0900  BILITOT 0.6 0.6 0.5 0.7  AST 34 ALT 11* 10* 9* 7*  ALKPHOS 95 80 67 65  PROT 6.0* 5.8* 5.1* 5.1*  ALBUMIN 2.3* 2.1* 1.7* 1.8*    Assessment and  Plan: S/p GB fossa abscess drainage 8/4, upsized 8/18, prior bile leak and biliary stenting 9/22; afebrile; WBC nl, hgb 8.2, creat nl; paracentesis fluid cx's neg to date; cont current tx as per CCS/IM, ambulate/OOB; once drain output significantly diminIshes or if clinical status worsens obtain f/u CT   Signed: D. Jeananne Rama 03/27/2015, 12:14 PM   I spent a total of 15 minutes at the the patient's bedside AND on the patient's hospital floor or unit, greater than 50% of which was counseling/coordinating care for GB fossa drain

## 2015-03-27 NOTE — Progress Notes (Signed)
[facility] PROGRESS NOTE  Larry [name] ZOX:[id number] DOB: Nov 09, 1945 DOA: 03/22/2015 PCP: [name], MD  Assessment/Plan: 1. LLQ Abdominal pain -suspect related to intra abd fluid collection/peritoneal irritation -h/o Bile leak s/p ERCP and stent placement -no acute findings on CT 10/5 -s/p  US guided drainage 35 ml of cloudy bloody fluid, Cultures-NGTD -? Stop current Abx, will repeat CT Abd pelvis with contrast tomorrow -very poor/almost no PO intake now -increase dilaudid dose, encouraged PO, ensure  2. Transient Upper GI Bleed -resolved, EGD with hiatal hernia otherwise unremarkable -continue PPI  3. H/o intra abd hematoma post cholecystectomy, culture positive for Klebsiella, has been on IV Ceftriaxone and FLagyl since 9/9 for 4 weeks, completed 4 weeks 10/6, repeat Cx with no growth -Can Abx be stopped, will d/w CCS  4. Diabetes mellitus type 2 with renal complications - patient reduced dose of Lantus , SSI -CBGs soft, will stop Lantus  5. Paroxysmal atrial fibrillation  - presently off anticoagulation, in NSR, monitor.  6. CAD status post stenting  - denies any chest pain, stable  7. Acute renal failure - -resolved with hydration  8. Anemia of chronic disease -slight drop due to dilution most likely, monitor  9. Protein calorie malnutrition -pt would like to try ensure, will try this despite H/o DM  DVT proph: SCDs, due to abd hematoma  Code Status: Full Code Family Communication: none at bedside Disposition Plan: home when improved   Consultants:  GI  CCS Dr.Wilson  Antibiotics:  Ceftriaxone/Flagyl  HPI/Subjective: Still with abd pain, not able to eat due to pain and nausea  Objective: Filed Vitals:   03/27/15 0523  BP: 104/47  Pulse: 104  Temp: 98.4 F (36.9 C)  Resp: 16    Intake/Output Summary (Last 24 hours) at 03/27/15 1007 Last data filed at 03/27/15 0700  Gross per 24 hour  Intake      0 ml  Output    860 ml  Net    -860 ml   Filed Weights   03/23/15 1709 03/24/15 0500 03/26/15 0403  Weight: 103.1 kg (227 lb 4.7 oz) 106.7 kg (235 lb 3.7 oz) 110.6 kg (243 lb 13.3 oz)    Exam:   General:  AAOx3  Cardiovascular: S1S2/RRR  Respiratory: CTAB  Abdomen: soft, obese, Tender in LLQ , BS diminished  Musculoskeletal: no edema c/c/  Data Reviewed: Basic Metabolic Panel:  Recent Labs Lab 03/22/15 2230 03/23/15 0336 03/23/15 1645 03/25/15 0400 03/26/15 0900 03/27/15 0436  NA 139  --  141 141 141 143  K 4.5  --  4.4 4.1 3.9 3.5  CL 103  --  105 109 109 113*  CO2 27  --  GLUCOSE 173*  --  145* 110* 84 111*  BUN 9  --  CREATININE 1.30*  --  1.15 1.17 1.08 0.96  CALCIUM 7.4*  --  7.0* 7.0* 7.2* 6.7*  MG  --  1.1*  --   --   --   --    Liver Function Tests:  Recent Labs Lab 03/22/15 2230 03/23/15 1645 03/25/15 0400 03/26/15 0900  AST 34 ALT 11* 10* 9* 7*  ALKPHOS 95 80 67 65  BILITOT 0.6 0.6 0.5 0.7  PROT 6.0* 5.8* 5.1* 5.1*  ALBUMIN 2.3* 2.1* 1.7* 1.8*    Recent Labs Lab 03/22/15 2230  LIPASE 28   No results for input(s): AMMONIA in the last 168 hours.  CBC:  Recent Labs Lab 03/23/15 0330  03/23/15 2254 03/24/15 0222 03/25/15 0400 03/26/15 0900 03/27/15 0436  WBC 8.4  < > 9.4 9.0 8.6 7.1 5.8  NEUTROABS 6.5  --   --   --   --  5.3  --   HGB 9.8*  < > 9.4* 8.8* 8.5* 8.7* 8.2*  HCT 31.1*  < > 30.3* 28.5* 28.0* 28.2* 26.8*  MCV 97.8  < > 98.4 98.6 97.9 97.6 98.2  PLT 288  < > 264 264 222 234 213  < > = values in this interval not displayed. Cardiac Enzymes: No results for input(s): CKTOTAL, CKMB, CKMBINDEX, TROPONINI in the last 168 hours. BNP (last 3 results)  Recent Labs  03/10/15 1529  BNP 163.1*    ProBNP (last 3 results) No results for input(s): PROBNP in the last 8760 hours.  CBG:  Recent Labs Lab 03/26/15 1623 03/26/15 1948 03/27/15 0004 03/27/15 0405 03/27/15 0728  GLUCAP 114* 107* 116* 109* 108*    Recent  Results (from the past 240 hour(s))  MRSA PCR Screening     Status: None   Collection Time: 03/23/15  4:03 PM  Result Value Ref Range Status   MRSA by PCR NEGATIVE NEGATIVE Final    Comment:        The GeneXpert MRSA Assay (FDA approved for NASAL specimens only), is one component of a comprehensive MRSA colonization surveillance program. It is not intended to diagnose MRSA infection nor to guide or monitor treatment for MRSA infections.   Culture, body fluid-bottle     Status: None (Preliminary result)   Collection Time: 03/24/15 10:53 AM  Result Value Ref Range Status   Specimen Description FLUID ABDOMEN  Final   Special Requests AEB  Final   Culture   Final    NO GROWTH 2 DAYS Performed at [facility]    Report Status PENDING  Incomplete  Gram stain     Status: None   Collection Time: 03/24/15 10:53 AM  Result Value Ref Range Status   Specimen Description FLUID ABDOMEN  Final   Special Requests NONE  Final   Gram Stain   Final    FEW WBC PRESENT, PREDOMINANTLY PMN NO ORGANISMS SEEN Performed at [facility]    Report Status 03/24/2015 FINAL  Final     Studies: No results found.  Scheduled Meds: . cefTRIAXone (ROCEPHIN) IVPB 2 gram/50 mL D5W (Pyxis)  2 g Intravenous Q24H  . feeding supplement (ENSURE ENLIVE)  237 mL Oral BID BM  . insulin aspart  0-9 Units Subcutaneous Q4H  . metronidazole  500 mg Intravenous Q8H  . pantoprazole (PROTONIX) IV  40 mg Intravenous Q12H   Continuous Infusions: . sodium chloride 75 mL/hr at 03/27/15 0459   Antibiotics Given (last 72 hours)    Date/Time Action Medication Dose Rate   03/24/15 1049 Given   metroNIDAZOLE (FLAGYL) IVPB 500 mg 500 mg 100 mL/hr   03/24/15 1527 Given   cefTRIAXone (ROCEPHIN) 2 g in dextrose 5 % 50 mL IVPB 2 g 100 mL/hr   03/24/15 1829 Given   metroNIDAZOLE (FLAGYL) IVPB 500 mg 500 mg 100 mL/hr   03/25/15 0300 Given   metroNIDAZOLE (FLAGYL) IVPB 500 mg 500 mg 100 mL/hr   03/25/15  0950 Given   metroNIDAZOLE (FLAGYL) IVPB 500 mg 500 mg 100 mL/hr   03/25/15 1426 Given   cefTRIAXone (ROCEPHIN) 2 g in dextrose 5 % 50 mL IVPB 2 g 100 mL/hr  03/25/15 1720 Given   metroNIDAZOLE (FLAGYL) IVPB 500 mg 500 mg 100 mL/hr   03/26/15 0155 Given   metroNIDAZOLE (FLAGYL) IVPB 500 mg 500 mg 100 mL/hr   03/26/15 1610 Given   metroNIDAZOLE (FLAGYL) IVPB 500 mg 500 mg 100 mL/hr   03/26/15 1502 Given   cefTRIAXone (ROCEPHIN) 2 g in dextrose 5 % 50 mL IVPB 2 g 100 mL/hr   03/26/15 2002 Given   metroNIDAZOLE (FLAGYL) IVPB 500 mg 500 mg 100 mL/hr   03/27/15 0200 Given   metroNIDAZOLE (FLAGYL) IVPB 500 mg 500 mg 100 mL/hr      Principal Problem:   GIB (gastrointestinal bleeding) Active Problems:   Paroxysmal a-fib (HCC)   Acute blood loss anemia   Diabetes mellitus with renal complications (HCC)   Abdominal pain   CKD (chronic kidney disease), stage III   GI bleed    Time spent:    [facility]  [facility] Pager [phone]. If 7PM-7AM, please contact night-coverage at [email or web address], password [facility] 03/27/2015, 10:07 AM  LOS: 4 days

## 2015-03-27 NOTE — Care Management Note (Signed)
Case Management Note  Patient Details  Name: Larry [name] MRN: [id number] Date of Birth: Apr 25, 1946  Subjective/Objective:                  ENDOSCOPIC RETROGRADE CHOLANGIOPANCREATOGRAPHY   Action/Plan: discharge planning  Expected Discharge Date:   (unknown)               Expected Discharge Plan:  Home/Self Care  In-House Referral:  NA  Discharge planning Services  CM Consult  Post Acute Care Choice:    Choice offered to:     DME Arranged:  N/A DME Agency:     HH Arranged:    HH Agency:     Status of Service:  Completed, signed off  Medicare Important Message Given:    Date Medicare IM Given:    Medicare IM give by:    Date Additional Medicare IM Given:    Additional Medicare Important Message give by:     If discussed at Long Length of Stay Meetings, dates discussed:    Additional Comments: CM spoke with patient at the bedside about DME needs (RW ordered). Patient states he has 2 - 3 rolling walkers, a bedside table, crutches, a wheelchair and other medical equipment at home.  Antony Haste, RN 03/27/2015, 9:45 AM

## 2015-03-27 NOTE — Progress Notes (Signed)
PT Cancellation Note  Patient Details Name: Larry [name] MRN: [id number] DOB: [birth year]   Cancelled Treatment:    Reason Eval/Treat Not Completed: Pain limiting ability to participate. Pt declined OOB with PT on today. Will check back tomorrow.    Rebeca Alert, MPT Pager: [phone]

## 2015-03-28 LAB — GLUCOSE, CAPILLARY
GLUCOSE-CAPILLARY: 100 mg/dL — AB (ref 65–99)
GLUCOSE-CAPILLARY: 104 mg/dL — AB (ref 65–99)
Glucose-Capillary: 110 mg/dL — ABNORMAL HIGH (ref 65–99)
Glucose-Capillary: 125 mg/dL — ABNORMAL HIGH (ref 65–99)

## 2015-03-28 MED ORDER — FLUCONAZOLE IN SODIUM CHLORIDE 200-0.9 MG/100ML-% IV SOLN
200.0000 mg | INTRAVENOUS | Status: DC
  Administered 2015-03-28: 200 mg via INTRAVENOUS
  Filled 2015-03-28 (×2): qty 100

## 2015-03-28 MED ORDER — BOOST / RESOURCE BREEZE PO LIQD
1.0000 | Freq: Three times a day (TID) | ORAL | Status: DC
  Administered 2015-03-28 – 2015-03-29 (×5): 1 via ORAL
  Administered 2015-03-29: 20:00:00 via ORAL
  Administered 2015-03-30: 1 via ORAL

## 2015-03-28 MED ORDER — PANTOPRAZOLE SODIUM 40 MG PO TBEC
40.0000 mg | DELAYED_RELEASE_TABLET | Freq: Every day | ORAL | Status: DC
  Administered 2015-03-29: 40 mg via ORAL
  Filled 2015-03-28 (×2): qty 1

## 2015-03-28 NOTE — Progress Notes (Signed)
Spoke with son via phone and gave update about tests/work up done to date and answered all of his questions.   [name]. [name], MD, FACS General, Bariatric, & Minimally Invasive Surgery [facility], [place]

## 2015-03-28 NOTE — Care Management Important Message (Signed)
Important Message  Patient Details  Name: Larry [name] MRN: [id number] Date of Birth: 02-Jun-1946   Medicare Important Message Given:  Yes-second notification given    Haskell Flirt 03/28/2015, 1:09 PMImportant Message  Patient Details  Name: Larry [name] MRN: [id number] Date of Birth: 1945-11-22   Medicare Important Message Given:  Yes-second notification given    Haskell Flirt 03/28/2015, 1:09 PM

## 2015-03-28 NOTE — Progress Notes (Signed)
CRITICAL VALUE ALERT  Critical value received: Abdominal fluid growing yeast  Date of notification:  03/28/15  Time of notification:  1620  Critical value read back:yes  Nurse who received alert:  Lonni Fix  MD notified (1st page):  Dr Jomarie Longs  Time of first page:  1625  MD notified (2nd page):  Time of second page:  Responding MD:  Dr Jomarie Longs  Time MD responded:  1700

## 2015-03-28 NOTE — Evaluation (Signed)
Physical Therapy Evaluation Patient Details Name: Larry [name] MRN: [id number] DOB: [birth year] Today's Date: 03/28/2015   History of Present Illness  69 year old male with very long and complex history with his acute cholecystitis starting 10/06/2014 including multiple hospitalizations admiited due to new bile leak and had recent ERCP with biliary stent placement by GI 9/22 , returned to [facility] 10/4 with abdominal pain.  Clinical Impression  Patient is much more deconditioned than 2 weeks ago when he came back into hospital. Patient will benefit from PT to address problems listed in the note below.   Follow Up Recommendations Home health PT    Equipment Recommendations  None recommended by PT    Recommendations for Other Services       Precautions / Restrictions Precautions Precautions: Fall Precaution Comments: JP drain on R, monitor HR and BP      Mobility  Bed Mobility   Bed Mobility: Supine to Sit;Sit to Supine     Supine to sit: Mod assist;HOB elevated Sit to supine: Min assist   General bed mobility comments: assist  for trunk to upright from partial sidelying, assist  L leg onto bed.  Transfers Overall transfer level: Needs assistance Equipment used: Rolling walker (2 wheeled) Transfers: Sit to/from Stand Sit to Stand: Min assist         General transfer comment: VC for hand placement and reaching back, from bed and chair in halkl   Ambulation/Gait Ambulation/Gait assistance: Min assist Ambulation Distance (Feet): 60 Feet (x2) Assistive device: Rolling walker (2 wheeled) Gait Pattern/deviations: Step-through pattern Gait velocity: decreased   General Gait Details: patient very fatigued after 60'.  Had to rush to sit down after second walk. HR 125, sats 96% RA, BP 132/82 but was taken  at least 4 minutes  and in supine after the ambulation, Patient appeared pale after the ambulation.  Stairs            Wheelchair Mobility    Modified Rankin  (Stroke Patients Only)       Balance     Sitting balance-Leahy Scale: Good     Standing balance support: During functional activity;Bilateral upper extremity supported Standing balance-Leahy Scale: Fair                               Pertinent Vitals/Pain Faces Pain Scale: Hurts even more Pain Location: abdomens/side Pain Descriptors / Indicators: Sore Pain Intervention(s): Monitored during session;Repositioned    Home Living Family/patient expects to be discharged to:: Private residence Living Arrangements: Spouse/significant other Available Help at Discharge: Family;Available 24 hours/day Type of Home: House Home Access: Stairs to enter   [employer] of Steps: 2 Home Layout: One level Home Equipment: Walker - 2 wheels;Shower seat - built in;Bedside commode Additional Comments: wife recently had  surgery    Prior Function Level of Independence: Needs assistance   Gait / Transfers Assistance Needed: ambulatory with RW           Hand Dominance        Extremity/Trunk Assessment   Upper Extremity Assessment: RUE deficits/detail RUE Deficits / Details: noted R UE edema-PICC         Lower Extremity Assessment: Generalized weakness      Cervical / Trunk Assessment: Normal  Communication   Communication: No difficulties  Cognition Arousal/Alertness: Awake/alert Behavior During Therapy: WFL for tasks assessed/performed Overall Cognitive Status: Within Functional Limits for tasks assessed Area of Impairment: Safety/judgement  General Comments      Exercises        Assessment/Plan    PT Assessment Patient needs continued PT services  PT Diagnosis Difficulty walking;Generalized weakness;Acute pain   PT Problem List Decreased strength;Decreased activity tolerance;Decreased mobility;Obesity  PT Treatment Interventions DME instruction;Gait training;Functional mobility training;Therapeutic  activities;Patient/family education;Therapeutic exercise;Stair training   PT Goals (Current goals can be found in the Care Plan section) Acute Rehab PT Goals Patient Stated Goal: discharge home, get my strength back PT Goal Formulation: With patient Time For Goal Achievement: 04/11/15 Potential to Achieve Goals: Good    Frequency Min 3X/week   Barriers to discharge        Co-evaluation               End of Session Equipment Utilized During Treatment: Gait belt Activity Tolerance: Patient limited by fatigue Patient left: in bed;with call bell/phone within reach Nurse Communication: Mobility status         Time: 7846-9629 PT Time Calculation (min) (ACUTE ONLY): 22 min   Charges:   PT Evaluation $Initial PT Evaluation Tier I: 1 Procedure     PT G CodesRada Hay 03/28/2015, 2:07 PM Blanchard Kelch PT [phone]

## 2015-03-28 NOTE — Progress Notes (Signed)
[facility] PROGRESS NOTE  Larry [name] ZOX:[id number] DOB: 13-Jul-1945 DOA: 03/22/2015 PCP: [name], MD  Assessment/Plan: 1. LLQ Abdominal pain -Suspect related to intra abd fluid collection, extending to pelvis and causing peritoneal irritation -h/o Bile leak s/p ERCP and stent placement -no acute findings on CT 10/5 -s/p  US guided drainage 35 ml of cloudy bloody fluid, Cultures-NGTD-3days now -has completed more than 4 weeks of Abx, original stop date was 10/6, has been on this 5days extra, will stop Abx and monitor -today reports slight improvement, i will hold off on Repeat CT today -very poor PO intake, encouraged ensure -ambulate, OOB, PT  2. Transient Upper GI Bleed -resolved, EGD with hiatal hernia otherwise unremarkable -continue PPI  3. H/o intra abd hematoma post cholecystectomy, culture positive for Klebsiella, has been on IV Ceftriaxone and FLagyl since 9/9 for 4 weeks, completed 4 weeks 10/6, repeat Cx with no growth -Stop Abx as completed course and repeat CX negative  4. Diabetes mellitus type 2 with renal complications - patient reduced dose of Lantus , SSI -CBGs soft, stopped Lantus  5. Paroxysmal atrial fibrillation  - presently off anticoagulation, in NSR, monitor.  6. CAD status post stenting  - denies any chest pain, stable  7. Acute renal failure - -resolved with hydration  8. Anemia of chronic disease -slight drop due to dilution most likely, monitor  9. Protein calorie malnutrition -pt would like to try ensure, will try this despite H/o DM  DVT proph: SCDs, due to abd hematoma  Code Status: Full Code Family Communication: none at bedside Disposition Plan: home when improved   Consultants:  GI  CCS Dr.Wilson  Antibiotics:  Ceftriaxone/Flagyl  HPI/Subjective: Still with abd pain, not able to eat due to pain and nausea  Objective: Filed Vitals:   03/28/15 0605  BP: 146/75  Pulse: 106  Temp: 98.2 F (36.8 C)   Resp: 16    Intake/Output Summary (Last 24 hours) at 03/28/15 1033 Last data filed at 03/28/15 0700  Gross per 24 hour  Intake      0 ml  Output    670 ml  Net   -670 ml   Filed Weights   03/23/15 1709 03/24/15 0500 03/26/15 0403  Weight: 103.1 kg (227 lb 4.7 oz) 106.7 kg (235 lb 3.7 oz) 110.6 kg (243 lb 13.3 oz)    Exam:   General:  AAOx3  Cardiovascular: S1S2/RRR  Respiratory: CTAB  Abdomen: soft, obese, Tender in LLQ , BS diminished  Musculoskeletal: no edema c/c/  Data Reviewed: Basic Metabolic Panel:  Recent Labs Lab 03/22/15 2230 03/23/15 0336 03/23/15 1645 03/25/15 0400 03/26/15 0900 03/27/15 0436  NA 139  --  141 141 141 143  K 4.5  --  4.4 4.1 3.9 3.5  CL 103  --  105 109 109 113*  CO2 27  --  GLUCOSE 173*  --  145* 110* 84 111*  BUN 9  --  CREATININE 1.30*  --  1.15 1.17 1.08 0.96  CALCIUM 7.4*  --  7.0* 7.0* 7.2* 6.7*  MG  --  1.1*  --   --   --   --    Liver Function Tests:  Recent Labs Lab 03/22/15 2230 03/23/15 1645 03/25/15 0400 03/26/15 0900  AST 34 ALT 11* 10* 9* 7*  ALKPHOS 95 80 67 65  BILITOT 0.6 0.6 0.5 0.7  PROT 6.0* 5.8* 5.1* 5.1*  ALBUMIN 2.3* 2.1* 1.7* 1.8*    Recent Labs Lab 03/22/15 2230  LIPASE 28   No results for input(s): AMMONIA in the last 168 hours. CBC:  Recent Labs Lab 03/23/15 0330  03/23/15 2254 03/24/15 0222 03/25/15 0400 03/26/15 0900 03/27/15 0436  WBC 8.4  < > 9.4 9.0 8.6 7.1 5.8  NEUTROABS 6.5  --   --   --   --  5.3  --   HGB 9.8*  < > 9.4* 8.8* 8.5* 8.7* 8.2*  HCT 31.1*  < > 30.3* 28.5* 28.0* 28.2* 26.8*  MCV 97.8  < > 98.4 98.6 97.9 97.6 98.2  PLT 288  < > 264 264 222 234 213  < > = values in this interval not displayed. Cardiac Enzymes: No results for input(s): CKTOTAL, CKMB, CKMBINDEX, TROPONINI in the last 168 hours. BNP (last 3 results)  Recent Labs  03/10/15 1529  BNP 163.1*    ProBNP (last 3 results) No results for input(s): PROBNP in  the last 8760 hours.  CBG:  Recent Labs Lab 03/27/15 1158 03/27/15 1658 03/27/15 1954 03/27/15 2342 03/28/15 0741  GLUCAP 120* 117* 118* 102* 100*    Recent Results (from the past 240 hour(s))  MRSA PCR Screening     Status: None   Collection Time: 03/23/15  4:03 PM  Result Value Ref Range Status   MRSA by PCR NEGATIVE NEGATIVE Final    Comment:        The GeneXpert MRSA Assay (FDA approved for NASAL specimens only), is one component of a comprehensive MRSA colonization surveillance program. It is not intended to diagnose MRSA infection nor to guide or monitor treatment for MRSA infections.   Culture, body fluid-bottle     Status: None (Preliminary result)   Collection Time: 03/24/15 10:53 AM  Result Value Ref Range Status   Specimen Description FLUID ABDOMEN  Final   Special Requests AEB  Final   Culture   Final    NO GROWTH 3 DAYS Performed at [facility]    Report Status PENDING  Incomplete  Gram stain     Status: None   Collection Time: 03/24/15 10:53 AM  Result Value Ref Range Status   Specimen Description FLUID ABDOMEN  Final   Special Requests NONE  Final   Gram Stain   Final    FEW WBC PRESENT, PREDOMINANTLY PMN NO ORGANISMS SEEN Performed at [facility]    Report Status 03/24/2015 FINAL  Final     Studies: No results found.  Scheduled Meds: . cefTRIAXone (ROCEPHIN) IVPB 2 gram/50 mL D5W (Pyxis)  2 g Intravenous Q24H  . feeding supplement (ENSURE ENLIVE)  237 mL Oral BID BM  . insulin aspart  0-9 Units Subcutaneous Q4H  . metronidazole  500 mg Intravenous Q8H  . pantoprazole (PROTONIX) IV  40 mg Intravenous Q12H   Continuous Infusions:   Antibiotics Given (last 72 hours)    Date/Time Action Medication Dose Rate   03/25/15 1426 Given   cefTRIAXone (ROCEPHIN) 2 g in dextrose 5 % 50 mL IVPB 2 g 100 mL/hr   03/25/15 1720 Given   metroNIDAZOLE (FLAGYL) IVPB 500 mg 500 mg 100 mL/hr   03/26/15 0155 Given   metroNIDAZOLE  (FLAGYL) IVPB 500 mg 500 mg 100 mL/hr   03/26/15 0939 Given   metroNIDAZOLE (FLAGYL) IVPB 500 mg 500 mg 100 mL/hr   03/26/15 1502 Given   cefTRIAXone (ROCEPHIN) 2 g in dextrose 5 % 50 mL IVPB 2 g  100 mL/hr   03/26/15 2002 Given   metroNIDAZOLE (FLAGYL) IVPB 500 mg 500 mg 100 mL/hr   03/27/15 0200 Given   metroNIDAZOLE (FLAGYL) IVPB 500 mg 500 mg 100 mL/hr   03/27/15 1025 Given   metroNIDAZOLE (FLAGYL) IVPB 500 mg 500 mg 100 mL/hr   03/27/15 1429 Given   cefTRIAXone (ROCEPHIN) 2 g in dextrose 5 % 50 mL IVPB 2 g 100 mL/hr   03/27/15 1718 Given   metroNIDAZOLE (FLAGYL) IVPB 500 mg 500 mg 100 mL/hr   03/28/15 0156 Given   metroNIDAZOLE (FLAGYL) IVPB 500 mg 500 mg 100 mL/hr   03/28/15 1017 Given   metroNIDAZOLE (FLAGYL) IVPB 500 mg 500 mg 100 mL/hr      Principal Problem:   GIB (gastrointestinal bleeding) Active Problems:   Paroxysmal a-fib (HCC)   Acute blood loss anemia   Diabetes mellitus with renal complications (HCC)   Abdominal pain   CKD (chronic kidney disease), stage III   GI bleed   Abdominal fluid collection    Time spent:    [facility]  [facility] Pager [phone]. If 7PM-7AM, please contact night-coverage at [email or web address], password [email or web address] 03/28/2015, 10:33 AM  LOS: 5 days

## 2015-03-28 NOTE — Clinical Documentation Improvement (Signed)
Internal Medicine  Can the diagnosis of Malnutrition be further specified?   Document Severity - Severe(third degree), Moderate (second degree), Mild (first degree)  Other condition  Unable to clinically determine   Please exercise your independent, professional judgment when responding. A specific answer is not anticipated or expected.   Thank You, Rockwell Germany, BSN Health Information Management [place] [phone]

## 2015-03-28 NOTE — Progress Notes (Signed)
4 Days Post-Op  Subjective: States that his LLQ abd pain improved over weekend and much better. Will get nausea at times - mainly he states when drinking ensure. States he walked in halls.   Objective: Vital signs in last 24 hours: Temp:  [98.2 F (36.8 C)-98.7 F (37.1 C)] 98.2 F (36.8 C) (10/10 0605) Pulse Rate:  [53-107] 106 (10/10 0605) Resp:  [16] 16 (10/10 0605) BP: (126-146)/(65-75) 146/75 mmHg (10/10 0605) SpO2:  [93 %-94 %] 94 % (10/10 0605) Last BM Date: 03/26/15  Intake/Output from previous day: 10/09 0701 - 10/10 0700 In: -  Out: 670 [Urine:600; Drains:70] Intake/Output this shift:    Alert, nontoxic, appropriate Obese, soft, no guarding/rebound; MILD TTP  Drain - bilious No edema  Lab Results:   Recent Labs  03/26/15 0900 03/27/15 0436  WBC 7.1 5.8  HGB 8.7* 8.2*  HCT 28.2* 26.8*  PLT 234 213   BMET  Recent Labs  03/26/15 0900 03/27/15 0436  NA 141 143  K 3.9 3.5  CL 109 113*  CO2 26 25  GLUCOSE 84 111*  BUN 9 6  CREATININE 1.08 0.96  CALCIUM 7.2* 6.7*   PT/INR No results for input(s): LABPROT, INR in the last 72 hours. ABG No results for input(s): PHART, HCO3 in the last 72 hours.  Invalid input(s): PCO2, PO2  Studies/Results: No results found.  Anti-infectives: Anti-infectives    Start     Dose/Rate Route Frequency Ordered Stop   03/23/15 1800  metroNIDAZOLE (FLAGYL) IVPB 500 mg  Status:  Discontinued     500 mg 100 mL/hr over 60 Minutes Intravenous Every 8 hours 03/23/15 1640 03/28/15 1044   03/23/15 1430  cefTRIAXone (ROCEPHIN) 2 g in dextrose 5 % 50 mL IVPB  Status:  Discontinued     2 g 100 mL/hr over 30 Minutes Intravenous Every 24 hours 03/23/15 1422 03/28/15 1044   03/23/15 0745  metroNIDAZOLE (FLAGYL) IVPB 500 mg  Status:  Discontinued     500 mg 100 mL/hr over 60 Minutes Intravenous Every 6 hours 03/23/15 0744 03/23/15 1335      Assessment/Plan: s/p Procedure(s): ESOPHAGOGASTRODUODENOSCOPY (EGD)  (N/A)  Infected GB fossa hematoma - resolved Perihepatic hematoma - unchanged. Nothing to do  Bile leak s/p ERCP/stent - cont drain, record daily LLQ pain - aspirated ascites in LLQ, culture NTD. Reports pain better. No fever. No wbc. Would not repeat CT UGI bleed - transient. Resolved. hgb stable.   Agree with stopping abx. Has had very long extended course. No fever. No wbc.  Pt/ot Will order Breeze. Ensure contributing nausea?? From my point of view can start dc planning  [name]. [name], MD, FACS General, Bariatric, & Minimally Invasive Surgery [facility], [place]   LOS: 5 days    Larry [name] 03/28/2015

## 2015-03-28 NOTE — Progress Notes (Signed)
OT Cancellation Note  Patient Details Name: OLUFEMI MOFIELD MRN: [id number] DOB: 04-16-1946   Cancelled Treatment:    Reason Eval/Treat Not Completed: Fatigue/lethargy limiting ability to participate Will check on pt next day   Alba Cory 03/28/2015, 3:23 PM

## 2015-03-28 NOTE — Care Management Important Message (Signed)
Important Message  Patient Details  Name: Larry [name] MRN: [id number] Date of Birth: 08/06/1945   Medicare Important Message Given:  Yes-second notification given    Jamison, Hassie Mandt 03/28/2015, 1:09 PMImportant Message  Patient Details  Name: Larry [name] MRN: [id number] Date of Birth: 01/05/1946   Medicare Important Message Given:  Yes-second notification given    Jamison, Patrisha Hausmann 03/28/2015, 1:09 PM 

## 2015-03-29 DIAGNOSIS — R188 Other ascites: Secondary | ICD-10-CM

## 2015-03-29 LAB — CBC
HCT: 25.7 % — ABNORMAL LOW (ref 39.0–52.0)
Hemoglobin: 8.2 g/dL — ABNORMAL LOW (ref 13.0–17.0)
MCH: 30.4 pg (ref 26.0–34.0)
MCHC: 31.9 g/dL (ref 30.0–36.0)
MCV: 95.2 fL (ref 78.0–100.0)
PLATELETS: 186 10*3/uL (ref 150–400)
RBC: 2.7 MIL/uL — AB (ref 4.22–5.81)
RDW: 18.5 % — AB (ref 11.5–15.5)
WBC: 5.4 10*3/uL (ref 4.0–10.5)

## 2015-03-29 LAB — COMPREHENSIVE METABOLIC PANEL
ALT: 6 U/L — AB (ref 17–63)
AST: 16 U/L (ref 15–41)
Albumin: 1.7 g/dL — ABNORMAL LOW (ref 3.5–5.0)
Alkaline Phosphatase: 66 U/L (ref 38–126)
Anion gap: 4 — ABNORMAL LOW (ref 5–15)
BUN: 5 mg/dL — ABNORMAL LOW (ref 6–20)
CHLORIDE: 110 mmol/L (ref 101–111)
CO2: 27 mmol/L (ref 22–32)
CREATININE: 0.93 mg/dL (ref 0.61–1.24)
Calcium: 7.5 mg/dL — ABNORMAL LOW (ref 8.9–10.3)
GFR calc non Af Amer: >60 mL/min (ref >60)
Glucose, Bld: 111 mg/dL — ABNORMAL HIGH (ref 65–99)
POTASSIUM: 3.5 mmol/L (ref 3.5–5.1)
SODIUM: 141 mmol/L (ref 135–145)
Total Bilirubin: 0.6 mg/dL (ref 0.3–1.2)
Total Protein: 4.9 g/dL — ABNORMAL LOW (ref 6.5–8.1)

## 2015-03-29 LAB — GLUCOSE, CAPILLARY
GLUCOSE-CAPILLARY: 109 mg/dL — AB (ref 65–99)
GLUCOSE-CAPILLARY: 111 mg/dL — AB (ref 65–99)
GLUCOSE-CAPILLARY: 126 mg/dL — AB (ref 65–99)
GLUCOSE-CAPILLARY: 128 mg/dL — AB (ref 65–99)
Glucose-Capillary: 91 mg/dL (ref 65–99)

## 2015-03-29 MED ORDER — POTASSIUM CHLORIDE CRYS ER 20 MEQ PO TBCR
40.0000 meq | EXTENDED_RELEASE_TABLET | Freq: Every day | ORAL | Status: DC
Start: 2015-03-29 — End: 2015-03-30
  Administered 2015-03-29 – 2015-03-30 (×2): 40 meq via ORAL
  Filled 2015-03-29 (×2): qty 2

## 2015-03-29 MED ORDER — INSULIN ASPART 100 UNIT/ML ~~LOC~~ SOLN
0.0000 [IU] | Freq: Three times a day (TID) | SUBCUTANEOUS | Status: DC
  Administered 2015-03-29: 1 [IU] via SUBCUTANEOUS

## 2015-03-29 MED ORDER — FUROSEMIDE 20 MG PO TABS
20.0000 mg | ORAL_TABLET | Freq: Every day | ORAL | Status: DC
  Administered 2015-03-29 – 2015-03-30 (×2): 20 mg via ORAL
  Filled 2015-03-29 (×3): qty 1

## 2015-03-29 MED ORDER — POLYETHYLENE GLYCOL 3350 17 G PO PACK: 17.0000 g | PACK | Freq: Every day | ORAL | Status: DC

## 2015-03-29 MED ORDER — HYDROCODONE-ACETAMINOPHEN 5-300 MG PO TABS: 1.0000 | ORAL_TABLET | Freq: Four times a day (QID) | ORAL | Status: DC | PRN

## 2015-03-29 NOTE — Progress Notes (Signed)
Physical Therapy Treatment Patient Details Name: Larry [name] MRN: [id number] DOB: [birth year] Today's Date: 03/29/2015    History of Present Illness  Re admitted for ABD pain    PT Comments    Pt is very very well known to me from prior admits. Noted drastic weight gain/ABD girth/fluid from when he was D/C'd about 2 weeks ago to home.   Assited OOB to amb to bathroom + 1 assist with walker.  Fatigues quickly.  Assisted in abthroom then amb in hallway several times with extended sitting rest breaks between.  Avg HR 119 and RA 96%.  Extra effort due to increased body weight.  Py highly motivated.  Did have issues of nausea with activity.  RN notified for meds.   Follow Up Recommendations  Home health PT     Equipment Recommendations  None recommended by PT    Recommendations for Other Services       Precautions / Restrictions Precautions Precautions: Fall Precaution Comments: JP drain on R, monitor HR and BP Restrictions Weight Bearing Restrictions: No    Mobility  Bed Mobility Overal bed mobility: Needs Assistance       Supine to sit: Min assist     General bed mobility comments: assist for upper body due to increased ABD girth and increased time to complete scooting  Transfers Overall transfer level: Needs assistance Equipment used: Rolling walker (2 wheeled) Transfers: Sit to/from Stand Sit to Stand: Supervision;Min guard         General transfer comment: assisted off bed, on/off toilet and on/off recliner with one VC on hand placement esp with stand to sit.    Ambulation/Gait Ambulation/Gait assistance: Min guard Ambulation Distance (Feet): 115 Feet (50', 35', 30' sitting rest break between) Assistive device: Rolling walker (2 wheeled) Gait Pattern/deviations: Step-through pattern;Decreased stride length     General Gait Details: fatigues quickly.  Tolerated increased distance but in increments.  Avg HR 119 and noted 2/4 DOE.    Stairs             Wheelchair Mobility    Modified Rankin (Stroke Patients Only)       Balance                                    Cognition Arousal/Alertness: Awake/alert Behavior During Therapy: WFL for tasks assessed/performed Overall Cognitive Status: Within Functional Limits for tasks assessed                      Exercises      General Comments        Pertinent Vitals/Pain Pain Assessment: No/denies pain    Home Living                      Prior Function            PT Goals (current goals can now be found in the care plan section) Progress towards PT goals: Progressing toward goals    Frequency  Min 3X/week    PT Plan Current plan remains appropriate    Co-evaluation             End of Session Equipment Utilized During Treatment: Gait belt Activity Tolerance: Patient limited by fatigue Patient left: in chair;with call bell/phone within reach     Time: 1115-1200 PT Time Calculation (min) (ACUTE ONLY): 45 min  Charges:  $Gait Training: 23-37 mins $  Therapeutic Activity: 8-22 mins                    G Codes:      Rica Koyanagi  PTA [facility]  Acute  Rehab Pager      [phone]

## 2015-03-29 NOTE — Discharge Summary (Signed)
Physician Discharge Summary  Larry [name]:[id number] DOB: Dec 04, 1945 DOA: 03/22/2015  PCP: Larry Miles, MD  Admit date: 03/22/2015 Discharge date: 03/29/2015  Time spent: 45 minutes  Recommendations for Outpatient Follow-up:  1. Dr.Wilson in 2 weeks  Discharge Diagnoses:    LLQ Abdominal pain   GIB (gastrointestinal bleeding)   Paroxysmal a-fib (HCC)   Acute blood loss anemia   Diabetes mellitus with renal complications (HCC)   Abdominal pain   CKD (chronic kidney disease), stage III   GI bleed   Abdominal fluid collection   Discharge Condition: stable  Diet recommendation: Diabetic  Filed Weights   03/23/15 1709 03/24/15 0500 03/26/15 0403  Weight: 103.1 kg (227 lb 4.7 oz) 106.7 kg (235 lb 3.7 oz) 110.6 kg (243 lb 13.3 oz)    History of present illness:  69 y/o male with complex surgical history, was admitted with Sepsis, acute cholecystitis, hypotension, acute renal failure, 10/06/14. He had a drain placed by IR on 10/11/14. He recovered from this and was readmitted to the hospital on 12/30/14 for elective laparoscopic cholecystectomy.  Readmitted 01/19/15 with abdominal pain, nausea, vomiting, hypotension and fever -Infected post op Hematoma, in hospital from 01/18/14-02/26/15 Complications included sepsis, worsening anemia, and transfusion. Acute renal failure with CRRT, VDRF, hypotension with pressor support, and acute encephalopathy. FLudi aspirated multiple times, finally one of the cultures grew Klebsiella and was started on Ceftriaxone and Flagyl on 9/9 for 4 weeks. Then admitted 9/22 with Abd pain, Bile Leak-underwent ERCP and Stent placement by Dr.Hung 9/22, discharged home 9/26. Re-admitted now with L sided abdominal pain starting 10/4, also had an episode of Hematemesis  Hospital Course:   1. LLQ Abdominal pain -Suspect related to intra abd fluid collection, extending to pelvis and causing peritoneal irritation -h/o Bile leak s/p ERCP and stent placement on  9/22 last admission -no acute findings on CT 10/5 -s/p US guided drainage 35 ml of cloudy bloody fluid, Cultures-now with YEAST -has completed more than 4 weeks of Abx, original stop date was 10/6, got 5days extra,Stopped Abx 10/10 -pain improving -Po intake improving, Ambulating better -ID was consulted due to positive yeast, Dr.Comer didn't feel that therapy was warranted for this at this time.  2. Transient Upper GI Bleed -resolved, Underwent EGD which revealed hiatal hernia otherwise unremarkable -continue PPI  3. H/o intra abd hematoma post cholecystectomy, culture positive for Klebsiella-8/31, has been on IV Ceftriaxone and FLagyl since 9/9 for 4 weeks, completed 4 weeks 10/6, -Stopped Abx as completed course   4. Diabetes mellitus type 2 with renal complications - on lantus at home -CBGs stable (90-128) , stopped Lantus  5. Paroxysmal atrial fibrillation - presently off anticoagulation, in NSR, monitor.  6. CAD status post stenting  - denies any chest pain, stable  7. Acute renal failure - -resolved with hydration  8. Anemia of chronic disease -slight drop due to dilution most likely, monitor  9. Protein calorie malnutrition -pt would like to try ensure, will try this despite H/o DM   Consultations:  CCS dr.Wilson  ID Dr.Comer  Discharge Exam: Filed Vitals:   03/29/15 1314  BP: 107/55  Pulse: 100  Temp: 98.5 F (36.9 C)  Resp: 18    General: AAOx3 Cardiovascular: S1S2/RRR Respiratory: CTAB  Discharge Instructions    Current Discharge Medication List    START taking these medications   Details  Hydrocodone-Acetaminophen 5-300 MG TABS Take 1 tablet by mouth every 6 (six) hours as needed. Qty: 30 each, Refills: 0  polyethylene glycol (MIRALAX) packet Take 17 g by mouth daily. Qty: 14 each, Refills: 0      CONTINUE these medications which have NOT CHANGED   Details  antiseptic oral rinse (CPC / CETYLPYRIDINIUM CHLORIDE 0.05%) 0.05 % LIQD  solution 7 mLs by Mouth Rinse route 2 times daily at 12 noon and 4 pm.    atorvastatin (LIPITOR) 80 MG tablet Take 80 mg by mouth at bedtime.     feeding supplement, ENSURE ENLIVE, (ENSURE ENLIVE) LIQD Take 237 mLs by mouth 2 (two) times daily between meals.    furosemide (LASIX) 40 MG tablet Take 40 mg by mouth daily.    glucose 4 GM chewable tablet Chew 1 tablet by mouth as needed for low blood sugar (only if BS IS BELOW 70).    lactobacillus acidophilus (BACID) TABS tablet Take 1 tablet by mouth 2 (two) times daily.    levalbuterol (XOPENEX) 0.63 MG/3ML nebulizer solution Take 3 mLs (0.63 mg total) by nebulization every 6 (six) hours as needed for wheezing or shortness of breath.    LORazepam (ATIVAN) 0.5 MG tablet Take 0.5 mg by mouth at bedtime.    magnesium oxide (MAG-OX) 400 MG tablet Take 400 mg by mouth 2 (two) times daily.     ondansetron (ZOFRAN-ODT) 4 MG disintegrating tablet Take 1 tablet (4 mg total) by mouth every 6 (six) hours as needed for nausea. Qty: 20 tablet, Refills: 0    pantoprazole (PROTONIX) 40 MG tablet Take 40 mg by mouth daily before breakfast.     potassium chloride SA (K-DUR,KLOR-CON) 20 MEQ tablet Take 1 tablet (20 mEq total) by mouth daily. Qty: 30 tablet, Refills: 0    vitamin C (ASCORBIC ACID) 500 MG tablet Take 500 mg by mouth 2 (two) times daily.    zinc sulfate 220 MG capsule Take 220 mg by mouth every morning.    acetaminophen (TYLENOL) 325 MG tablet Take 2 tablets (650 mg total) by mouth every 6 (six) hours as needed for mild pain, moderate pain, fever or headache.    insulin aspart (NOVOLOG) 100 UNIT/ML injection Inject 0-9 Units into the skin 3 (three) times daily with meals. CBG < 70: implement hypoglycemia protocol CBG 70 - 120: 0 units CBG 121 - 150: 1 unit CBG 151 - 200: 2 units CBG 201 - 250: 3 units CBG 251 - 300: 5 units CBG 301 - 350: 7 units CBG 351 - 400: 9 units CBG > 400: call MD.      STOP taking these medications      cefTRIAXone 2 g in dextrose 5 % 50 mL      insulin glargine (LANTUS) 100 UNIT/ML injection      Melatonin 3 MG TABS      metroNIDAZOLE (FLAGYL) 500 MG tablet      traMADol (ULTRAM) 50 MG tablet        No Known Allergies    The results of significant diagnostics from this hospitalization (including imaging, microbiology, ancillary and laboratory) are listed below for reference.    Significant Diagnostic Studies: Ct Abdomen Pelvis Wo Contrast  03/23/2015   CLINICAL DATA:  Abdominal pain.  Cholecystectomy July 2016.  EXAM: CT ABDOMEN AND PELVIS WITHOUT CONTRAST  TECHNIQUE: Multidetector CT imaging of the abdomen and pelvis was performed following the standard protocol without IV contrast.  COMPARISON:  CT 03/11/2015, 02/16/2015  FINDINGS: Small right pleural effusion with compressive atelectasis right lower lobe.  Pigtail drainage catheter in the gallbladder fossa in good position. No  significant residual fluid collection in this area.  Subcapsular fluid collection lateral and posterior to the liver is unchanged and contains gas. This has been aspirated in the past and is felt to be hematoma. This extends into the right colic gutter and into the pelvis and overall is unchanged from the prior study. Remainder of the liver is normal. Spleen is normal in size  Kidneys show no obstruction or mass.  Pancreas is negative.  Progressive ascites throughout the abdomen most prominent in the pelvis anteriorly.  Negative for bowel obstruction.  No bowel edema.  No mass or adenopathy.  Atherosclerotic aorta.  Lumbar degenerative change. Mild chronic fracture T9 unchanged. Grade 1 anterior slip L4-5 due to disc and facet degeneration.  IMPRESSION: Pigtail drainage catheter in the gallbladder fossa without residual fluid collection  Moderately large fluid collection posterior lateral to the liver extending into the pelvis. This contains gas and is unchanged from the prior study. This has been aspirated with  negative cultures.  Interval increase in ascites since the prior study.   Electronically Signed   By: Marlan Palau M.D.   On: 03/23/2015 11:26   Ct Abdomen Pelvis Wo Contrast  03/11/2015   CLINICAL DATA:  Laparoscopic cholecystectomy 01/04/2015. Large infected postop hematoma and percutaneous drain placement 02/27/2015. Bile leak.  EXAM: CT ABDOMEN AND PELVIS WITHOUT CONTRAST  TECHNIQUE: Multidetector CT imaging of the abdomen and pelvis was performed following the standard protocol without IV contrast.  COMPARISON:  02/16/2015  FINDINGS: There are small bilateral pleural effusions with bilateral lower lobe airspace opacities, likely atelectasis. Pneumonia not completely excluded in the right lung base.  Percutaneous drainage catheter is noted in the gallbladder fossa a region where there is near complete evacuation of the previously seen complex gas and fluid collection. Subcapsular fluid collection still noted and now contains gas superiorly. The fluid collection measures up to 10 cm and overall size is similar to prior study. Biliary stent is noted with pneumobilia. There is a small fluid collection anterior to the hepatic flexure which measures 2.2 cm, decreased in size when this previously measured 3.9 cm. Previously seen ascites has resolved.  Spleen, pancreas, adrenals and kidneys have an unremarkable unenhanced appearance. No evidence of bowel obstruction. Urinary bladder is grossly unremarkable. Aorta is calcified, non aneurysmal. No evidence of bowel obstruction.  IMPRESSION: Near complete resolution of the gallbladder fossa complex fluid collection with drainage catheter in place. Of the subcapsular fluid collection along the right lateral liver margin is initially stable in size and now contains gas superiorly. Cannot exclude infection/abscess.  Near complete resolution of the ascites.  Biliary stent in place.  Pneumobilia noted.  Small bilateral effusions with bibasilar atelectasis or infiltrates.  Cannot exclude pneumonia in the right lower lobe.   Electronically Signed   By: Charlett Nose M.D.   On: 03/11/2015 15:58   Dg Abd 1 View  03/23/2015   CLINICAL DATA:  Lower left abdominal pain for 24 hr.  EXAM: ABDOMEN - 1 VIEW  COMPARISON:  CT 03/11/2015  FINDINGS: The biliary stent and the percutaneous cholecystostomy catheter appear unchanged in position. The abdominal gas pattern is negative for obstruction or perforation. No biliary or urinary calculi are evident. Severe lumbar degenerative changes incidentally noted.  IMPRESSION: Normal abdominal gas pattern. Unchanged positions of the percutaneous cholecystostomy drain and the biliary stent.   Electronically Signed   By: Ellery Plunk M.D.   On: 03/23/2015 03:59   Nm Hepatobiliary Including Gb  03/10/2015   CLINICAL  DATA:  Laparoscopic cholecystectomy 01/04/2015. Infected hematoma gallbladder fossa. Evaluate for bile leak. Surgical drain in place.  EXAM: NUCLEAR MEDICINE HEPATOBILIARY IMAGING  TECHNIQUE: Sequential images of the abdomen were obtained out to 60 minutes following intravenous administration of radiopharmaceutical.  RADIOPHARMACEUTICALS:  5.3 mCi Tc-47m  Choletec IV  COMPARISON:  CT 02/16/2015.  FINDINGS: The surgical drain was capped for the procedure. Initial images demonstrate homogeneous hepatic activity and prompt visualization of the biliary system. There is rapid accumulation of the radiopharmaceutical within the subhepatic fluid collection. Activity extends into the right pericolic gutter. This follows the distribution of fluid on prior CT. Activity also extends into the bowel.  IMPRESSION: Study is positive for a bile leak with rapid accumulation of activity in the fluid collections in the subhepatic space and right paracolic gutter. No evidence of biliary obstruction.   Electronically Signed   By: Carey Bullocks M.D.   On: 03/10/2015 09:22   Ct Aspiration  03/12/2015   CLINICAL DATA:  69 year old with history of bile [place]  and recently placed biliary stent. The patient has a persistent perihepatic fluid collection of uncertain etiology. Request for sampling and possible drainage of this collection. Patient already has a gallbladder fossa drain.  EXAM: CT GUIDED ASPIRATION OF PERIHEPATIC FLUID COLLECTION  ANESTHESIA/SEDATION: 0.5 mg versed, 25 mcg fentanyl. A radiology nurse monitored the patient for moderate sedation.  Total Moderate Sedation Time: 6 minutes.  PROCEDURE: The procedure was explained to the patient. The risks and benefits of the procedure were discussed and the patient's questions were addressed. Informed consent was obtained from the patient. Patient was positioned on the CT scanner with the right side elevated. CT images of the abdomen were obtained. The right lateral abdomen was prepped and draped in a sterile fashion. The skin was anesthetized with 1% lidocaine. Using CT guidance, a 19 gauge Yueh catheter was directed into the perihepatic collection. 2 mL of bloody fluid was removed. No additional fluid could be obtained. Sample was sent for Gram stain and culture. The Yueh catheter was removed and a bandage was placed at the puncture site.  Complications: No immediate complication.  Estimated blood loss: Minimal  FINDINGS: There is a slightly irregular-shaped perihepatic fluid collection with gas along the cephalad aspect. Yueh catheter placed in this collection and only a small amount of blood could be obtained. Findings compatible with a large hematoma.  IMPRESSION: CT-guided aspiration of the perihepatic collection. Findings are most compatible with a hematoma. The fluid sample was sent for Gram stain and culture.   Electronically Signed   By: Richarda Overlie M.D.   On: 03/12/2015 11:27   US Paracentesis  03/24/2015   INDICATION: Ascites, request for paracentesis.  EXAM: ULTRASOUND-GUIDED PARACENTESIS  COMPARISON:  Paracentesis 02/16/2015.  MEDICATIONS: None.  COMPLICATIONS: None immediate  TECHNIQUE: Informed  written consent was obtained from the patient after a discussion of the risks, benefits and alternatives to treatment. A timeout was performed prior to the initiation of the procedure.  Initial ultrasound scanning demonstrates a small amount of ascites within the left midline abdominal region. The left midline abdomen was prepped and draped in the usual sterile fashion. 1% lidocaine was used for local anesthesia.  Under direct ultrasound guidance, a 19 gauge, 10-cm, Yueh catheter was introduced. An ultrasound image was saved for documentation purposed. The paracentesis was performed. The catheter was removed and a dressing was applied. The patient tolerated the procedure well without immediate post procedural complication.  FINDINGS: A total of approximately 35 ml  of cloudy bloody fluid was removed. Samples were sent to the laboratory as requested by the clinical team.  IMPRESSION: Successful ultrasound-guided paracentesis yielding 35 ml of peritoneal fluid.  Read By:  Pattricia Boss PA-C   Electronically Signed   By: Gilmer Mor D.O.   On: 03/24/2015 12:08   Dg Chest Portable 1 View  03/22/2015   CLINICAL DATA:  Abdominal pain  EXAM: PORTABLE CHEST 1 VIEW  COMPARISON:  02/22/2015  FINDINGS: Stable right PICC. NG tube removed. Bibasilar atelectasis is stable. Upper lungs clear. Normal heart size. No pneumothorax.  IMPRESSION: Stable bibasilar atelectasis.   Electronically Signed   By: Jolaine Click M.D.   On: 03/22/2015 23:33   Dg Ercp Biliary & Pancreatic Ducts  03/10/2015   CLINICAL DATA:  69 year old male with potential common bile duct stones.  EXAM: ERCP  TECHNIQUE: Multiple spot images obtained with the fluoroscopic device and submitted for interpretation post-procedure.  FLUOROSCOPY TIME:  If the device does not provide the exposure index:  Fluoroscopy Time:  1.18 minutes  Number of Acquired Images:  4  COMPARISON:  CT the abdomen and pelvis 02/16/2015.  FINDINGS: Four images of the ERCP are provided  for evaluation. Initial image demonstrates a pigtail drainage catheter in the right upper quadrant of the abdomen, presumably within the gallbladder fossa. Endoscope in position with cannulation of the Sphincter of Oddi and the guidewire extending into the distal common bile duct. Second image demonstrates similar findings. Third image demonstrates advancement of a guidewire into the intrahepatic biliary tree, and apparent extension of the guidewire into the region of the gallbladder fossa coming in close proximity to the indwelling catheter. Notably, injection at this time demonstrates opacification of the common bile duct and a portion of the intrahepatic biliary tree (common bile duct is poorly opacified), and there appears to be a extravasation of contrast material into the gallbladder fossa around the pigtail drainage catheter. The fourth image demonstrates placement of a common bile duct stent which appears appropriately located, withdrawal of the endoscope, and persistence of contrast material within the gallbladder fossa.  IMPRESSION: 1. ERCP images document placement of CBD stent, as above. 2. Extravasation of contrast material into the gallbladder fossa, presumably within a persistent biloma. 3. Images provided were insufficient to accurately assess for the presence or absence of common bile duct stones. Please refer to procedural note from ERCP for further details. These images were submitted for radiologic interpretation only. Please see the procedural report for the amount of contrast and the fluoroscopy time utilized.   Electronically Signed   By: Trudie Reed M.D.   On: 03/10/2015 17:41    Microbiology: Recent Results (from the past 240 hour(s))  MRSA PCR Screening     Status: None   Collection Time: 03/23/15  4:03 PM  Result Value Ref Range Status   MRSA by PCR NEGATIVE NEGATIVE Final    Comment:        The GeneXpert MRSA Assay (FDA approved for NASAL specimens only), is one component  of a comprehensive MRSA colonization surveillance program. It is not intended to diagnose MRSA infection nor to guide or monitor treatment for MRSA infections.   Culture, body fluid-bottle     Status: None (Preliminary result)   Collection Time: 03/24/15 10:53 AM  Result Value Ref Range Status   Specimen Description FLUID ABDOMEN  Final   Special Requests AEB  Final   Gram Stain   Final    BUDDING YEAST SEEN AEROBIC BOTTLE  ONLY CRITICAL RESULT CALLED TO, READ BACK BY AND VERIFIED WITH: E ACQUAAH,RN AT 1624 03/28/15 BY L BENFIELD    Culture   Final    YEAST CULTURE REINCUBATED FOR BETTER GROWTH Performed at [facility]    Report Status PENDING  Incomplete  Gram stain     Status: None   Collection Time: 03/24/15 10:53 AM  Result Value Ref Range Status   Specimen Description FLUID ABDOMEN  Final   Special Requests NONE  Final   Gram Stain   Final    FEW WBC PRESENT, PREDOMINANTLY PMN NO ORGANISMS SEEN Performed at [facility]    Report Status 03/24/2015 FINAL  Final     Labs: Basic Metabolic Panel:  Recent Labs Lab 03/23/15 0336 03/23/15 1645 03/25/15 0400 03/26/15 0900 03/27/15 0436 03/29/15 0542  NA  --  141 141 141 143 141  K  --  4.4 4.1 3.9 3.5 3.5  CL  --  105 109 109 113* 110  CO2  --  30 27 26 25 27   GLUCOSE  --  145* 110* 84 111* 111*  BUN  --  9 10 9 6  5*  CREATININE  --  1.15 1.17 1.08 0.96 0.93  CALCIUM  --  7.0* 7.0* 7.2* 6.7* 7.5*  MG 1.1*  --   --   --   --   --    Liver Function Tests:  Recent Labs Lab 03/22/15 2230 03/23/15 1645 03/25/15 0400 03/26/15 0900 03/29/15 0542  AST 34 22 15 16 16   ALT 11* 10* 9* 7* 6*  ALKPHOS 95 80 67 65 66  BILITOT 0.6 0.6 0.5 0.7 0.6  PROT 6.0* 5.8* 5.1* 5.1* 4.9*  ALBUMIN 2.3* 2.1* 1.7* 1.8* 1.7*    Recent Labs Lab 03/22/15 2230  LIPASE 28   No results for input(s): AMMONIA in the last 168 hours. CBC:  Recent Labs Lab 03/23/15 0330  03/24/15 0222 03/25/15 0400  03/26/15 0900 03/27/15 0436 03/29/15 0542  WBC 8.4  < > 9.0 8.6 7.1 5.8 5.4  NEUTROABS 6.5  --   --   --  5.3  --   --   HGB 9.8*  < > 8.8* 8.5* 8.7* 8.2* 8.2*  HCT 31.1*  < > 28.5* 28.0* 28.2* 26.8* 25.7*  MCV 97.8  < > 98.6 97.9 97.6 98.2 95.2  PLT 288  < > 264 222 234 213 186  < > = values in this interval not displayed. Cardiac Enzymes: No results for input(s): CKTOTAL, CKMB, CKMBINDEX, TROPONINI in the last 168 hours. BNP: BNP (last 3 results)  Recent Labs  03/10/15 1529  BNP 163.1*    ProBNP (last 3 results) No results for input(s): PROBNP in the last 8760 hours.  CBG:  Recent Labs Lab 03/28/15 1635 03/28/15 2014 03/29/15 03/29/15 0738 03/29/15 1153  GLUCAP 125* 104* 128* 91 111*       Signed:  Gabrian Hoque  [facility] 03/29/2015, 2:35 PM

## 2015-03-29 NOTE — Progress Notes (Signed)
[facility] PROGRESS NOTE  Larry [name] WUJ:[id number] DOB: 08/10/1945 DOA: 03/22/2015 PCP: [name], MD  Assessment/Plan: 1. LLQ Abdominal pain -Suspect related to intra abd fluid collection, extending to pelvis and causing peritoneal irritation -h/o Bile leak s/p ERCP and stent placement -no acute findings on CT 10/5 -s/p  US guided drainage 35 ml of cloudy bloody fluid, Cultures-now growing YEAST -has completed more than 4 weeks of Abx, original stop date was 10/6, got 5days extra,Stopped Abx 10/10 -pain improving -Po intake improving, Ambulating better -ID consulted d/w Dr.Comer  2. Transient Upper GI Bleed -resolved, EGD with hiatal hernia otherwise unremarkable -continue PPI  3. H/o intra abd hematoma post cholecystectomy, culture positive for Klebsiella, has been on IV Ceftriaxone and FLagyl since 9/9 for 4 weeks, completed 4 weeks 10/6, -Stopped Abx as completed course   4. Diabetes mellitus type 2 with renal complications - on lantus at home -CBGs soft, stopped Lantus  5. Paroxysmal atrial fibrillation  - presently off anticoagulation, in NSR, monitor.  6. CAD status post stenting  - denies any chest pain, stable  7. Acute renal failure - -resolved with hydration  8. Anemia of chronic disease -slight drop due to dilution most likely, monitor  9. Protein calorie malnutrition -pt would like to try ensure, will try this despite H/o DM  DVT proph: SCDs, due to abd hematoma  Code Status: Full Code Family Communication: none at bedside Disposition Plan: home when improved, ? 1-2days   Consultants:  GI  [facility] Dr.Wilson  Antibiotics:  Ceftriaxone/Flagyl  HPI/Subjective: Pain improving, much better today  Objective: Filed Vitals:   03/29/15 0556  BP: 133/68  Pulse: 98  Temp: 97.9 F (36.6 C)  Resp: 16    Intake/Output Summary (Last 24 hours) at 03/29/15 0946 Last data filed at 03/29/15 0640  Gross per 24 hour  Intake  473.5 ml   Output    340 ml  Net  133.5 ml   Filed Weights   03/23/15 1709 03/24/15 0500 03/26/15 0403  Weight: 103.1 kg (227 lb 4.7 oz) 106.7 kg (235 lb 3.7 oz) 110.6 kg (243 lb 13.3 oz)    Exam:   General:  AAOx3  Cardiovascular: S1S2/RRR  Respiratory: CTAB  Abdomen: soft, obese, less Tender in LLQ , BS diminished  Musculoskeletal: no edema c/c/  Data Reviewed: Basic Metabolic Panel:  Recent Labs Lab 03/23/15 0336 03/23/15 1645 03/25/15 0400 03/26/15 0900 03/27/15 0436 03/29/15 0542  NA  --  141 141 141 143 141  K  --  4.4 4.1 3.9 3.5 3.5  CL  --  105 109 109 113* 110  CO2  --  GLUCOSE  --  145* 110* 84 111* 111*  BUN  --  5*  CREATININE  --  1.15 1.17 1.08 0.96 0.93  CALCIUM  --  7.0* 7.0* 7.2* 6.7* 7.5*  MG 1.1*  --   --   --   --   --    Liver Function Tests:  Recent Labs Lab 03/22/15 2230 03/23/15 1645 03/25/15 0400 03/26/15 0900 03/29/15 0542  AST 34 ALT 11* 10* 9* 7* 6*  ALKPHOS 95 80 67 65 66  BILITOT 0.6 0.6 0.5 0.7 0.6  PROT 6.0* 5.8* 5.1* 5.1* 4.9*  ALBUMIN 2.3* 2.1* 1.7* 1.8* 1.7*    Recent Labs Lab 03/22/15 2230  LIPASE 28   No results for input(s): AMMONIA in the last 168  hours. CBC:  Recent Labs Lab 03/23/15 0330  03/24/15 0222 03/25/15 0400 03/26/15 0900 03/27/15 0436 03/29/15 0542  WBC 8.4  < > 9.0 8.6 7.1 5.8 5.4  NEUTROABS 6.5  --   --   --  5.3  --   --   HGB 9.8*  < > 8.8* 8.5* 8.7* 8.2* 8.2*  HCT 31.1*  < > 28.5* 28.0* 28.2* 26.8* 25.7*  MCV 97.8  < > 98.6 97.9 97.6 98.2 95.2  PLT 288  < > 264 222 234 213 186  < > = values in this interval not displayed. Cardiac Enzymes: No results for input(s): CKTOTAL, CKMB, CKMBINDEX, TROPONINI in the last 168 hours. BNP (last 3 results)  Recent Labs  03/10/15 1529  BNP 163.1*    ProBNP (last 3 results) No results for input(s): PROBNP in the last 8760 hours.  CBG:  Recent Labs Lab 03/28/15 1141 03/28/15 1635 03/28/15 2014  03/29/15 03/29/15 0738  GLUCAP 110* 125* 104* 128* 91    Recent Results (from the past 240 hour(s))  MRSA PCR Screening     Status: None   Collection Time: 03/23/15  4:03 PM  Result Value Ref Range Status   MRSA by PCR NEGATIVE NEGATIVE Final    Comment:        The GeneXpert MRSA Assay (FDA approved for NASAL specimens only), is one component of a comprehensive MRSA colonization surveillance program. It is not intended to diagnose MRSA infection nor to guide or monitor treatment for MRSA infections.   Culture, body fluid-bottle     Status: None (Preliminary result)   Collection Time: 03/24/15 10:53 AM  Result Value Ref Range Status   Specimen Description FLUID ABDOMEN  Final   Special Requests AEB  Final   Gram Stain   Final    BUDDING YEAST SEEN AEROBIC BOTTLE ONLY CRITICAL RESULT CALLED TO, READ BACK BY AND VERIFIED WITH: E ACQUAAH,RN AT 1624 03/28/15 BY L BENFIELD    Culture YEAST Performed at [facility]   Final   Report Status PENDING  Incomplete  Gram stain     Status: None   Collection Time: 03/24/15 10:53 AM  Result Value Ref Range Status   Specimen Description FLUID ABDOMEN  Final   Special Requests NONE  Final   Gram Stain   Final    FEW WBC PRESENT, PREDOMINANTLY PMN NO ORGANISMS SEEN Performed at [facility]    Report Status 03/24/2015 FINAL  Final     Studies: No results found.  Scheduled Meds: . feeding supplement  1 Container Oral TID BM  . fluconazole (DIFLUCAN) IV  200 mg Intravenous Q24H  . furosemide  20 mg Oral Daily  . insulin aspart  0-9 Units Subcutaneous TID WC  . pantoprazole  40 mg Oral Q1200  . potassium chloride  40 mEq Oral Daily   Continuous Infusions:   Antibiotics Given (last 72 hours)    Date/Time Action Medication Dose Rate   03/26/15 1502 Given   cefTRIAXone (ROCEPHIN) 2 g in dextrose 5 % 50 mL IVPB 2 g 100 mL/hr   03/26/15 2002 Given   metroNIDAZOLE (FLAGYL) IVPB 500 mg 500 mg 100 mL/hr    03/27/15 0200 Given   metroNIDAZOLE (FLAGYL) IVPB 500 mg 500 mg 100 mL/hr   03/27/15 1025 Given   metroNIDAZOLE (FLAGYL) IVPB 500 mg 500 mg 100 mL/hr   03/27/15 1429 Given   cefTRIAXone (ROCEPHIN) 2 g in dextrose 5 % 50 mL IVPB 2  g 100 mL/hr   03/27/15 1718 Given   metroNIDAZOLE (FLAGYL) IVPB 500 mg 500 mg 100 mL/hr   03/28/15 0156 Given   metroNIDAZOLE (FLAGYL) IVPB 500 mg 500 mg 100 mL/hr   03/28/15 1017 Given   metroNIDAZOLE (FLAGYL) IVPB 500 mg 500 mg 100 mL/hr      Principal Problem:   GIB (gastrointestinal bleeding) Active Problems:   Paroxysmal a-fib (HCC)   Acute blood loss anemia   Diabetes mellitus with renal complications (HCC)   Abdominal pain   CKD (chronic kidney disease), stage III   GI bleed   Abdominal fluid collection    Time spent:    [facility]  [facility] Pager [phone]. If 7PM-7AM, please contact night-coverage at [email or web address], password [facility] 03/29/2015, 9:46 AM  LOS: 6 days

## 2015-03-29 NOTE — Progress Notes (Signed)
Pt is active with [facility] and [facility] are following pt progress while in the hospital. HHPT/OT orders have been placed and [facility] will continue with services at DC. CM will continue to follow for DC needs. Sandford Craze RN,BSN,NCM [phone]

## 2015-03-29 NOTE — Progress Notes (Signed)
5 Days Post-Op  Subjective: Not many complaints. No LLQ pain. Some nausea with Resource breeze but having less nausea with Breeze than ensure.   Objective: Vital signs in last 24 hours: Temp:  [97.9 F (36.6 C)-98.1 F (36.7 C)] 97.9 F (36.6 C) (10/11 0556) Pulse Rate:  [98-103] 98 (10/11 0556) Resp:  [16-20] 16 (10/11 0556) BP: (133-144)/(68-82) 133/68 mmHg (10/11 0556) SpO2:  [94 %-97 %] 95 % (10/11 0556) Last BM Date: 03/26/15  Intake/Output from previous day: 10/10 0701 - 10/11 0700 In: 473.5 [P.O.:360; I.V.:113.5] Out: 340 [Urine:300; Drains:40] Intake/Output this shift:    Alert, nad Obese, soft, nt, drain - bilious  Lab Results:   Recent Labs  03/27/15 0436 03/29/15 0542  WBC 5.8 5.4  HGB 8.2* 8.2*  HCT 26.8* 25.7*  PLT 213 186   BMET  Recent Labs  03/27/15 0436 03/29/15 0542  NA 143 141  K 3.5 3.5  CL 113* 110  CO2 25 27  GLUCOSE 111* 111*  BUN 6 5*  CREATININE 0.96 0.93  CALCIUM 6.7* 7.5*   PT/INR No results for input(s): LABPROT, INR in the last 72 hours. ABG No results for input(s): PHART, HCO3 in the last 72 hours.  Invalid input(s): PCO2, PO2  Studies/Results: No results found.  Anti-infectives: Anti-infectives    Start     Dose/Rate Route Frequency Ordered Stop   03/28/15 1800  fluconazole (DIFLUCAN) IVPB 200 mg     200 mg 100 mL/hr over 60 Minutes Intravenous Every 24 hours 03/28/15 1726     03/23/15 1800  metroNIDAZOLE (FLAGYL) IVPB 500 mg  Status:  Discontinued     500 mg 100 mL/hr over 60 Minutes Intravenous Every 8 hours 03/23/15 1640 03/28/15 1044   03/23/15 1430  cefTRIAXone (ROCEPHIN) 2 g in dextrose 5 % 50 mL IVPB  Status:  Discontinued     2 g 100 mL/hr over 30 Minutes Intravenous Every 24 hours 03/23/15 1422 03/28/15 1044   03/23/15 0745  metroNIDAZOLE (FLAGYL) IVPB 500 mg  Status:  Discontinued     500 mg 100 mL/hr over 60 Minutes Intravenous Every 6 hours 03/23/15 0744 03/23/15 1335      Assessment/Plan: s/p  Procedure(s): ESOPHAGOGASTRODUODENOSCOPY (EGD) (N/A)  Infected GB fossa hematoma - resolved Perihepatic hematoma - unchanged. Nothing to do  Bile leak s/p ERCP/stent - cont drain, record daily LLQ pain - aspirated ascites in LLQ, culture showing yeast. Reports pain better. No fever. No wbc. Would not repeat CT. Agree with ID consult. This was not an organized fluid collection/abscess. Will defer to ID about whether or not to treat longterm UGI bleed - transient. Resolved. hgb stable.   Spoke with [facility] anticipate dc prob Wednesday  Larry [name]. [name], MD, FACS General, Bariatric, & Minimally Invasive Surgery [facility], [place]   LOS: 6 days    Larry [name] 03/29/2015
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
## 2015-03-29 NOTE — Evaluation (Signed)
Occupational Therapy Evaluation Patient Details Name: Larry [name] MRN: [id number] DOB: July 19, 1945 Today's Date: 03/29/2015    History of Present Illness 69 year old male with very long and complex history with his acute cholecystitis starting 10/06/2014 including multiple hospitalizations admiited due to new bile leak and had recent ERCP with biliary stent placement by GI 9/22 , returned to [facility] 10/4 with abdominal pain.   Clinical Impression   Pt was admitted for the above.  He has had multiple admissions and is deconditioned. Will follow in acute setting with mod I level goals.  Pt currently needs  Min guard to set up assistance for adls    Follow Up Recommendations  Supervision/Assistance - 24 hour (pt states HHPT was working on UE strengthening)    Equipment Recommendations  None recommended by OT    Recommendations for Other Services       Precautions / Restrictions Precautions Precautions: Fall Precaution Comments: JP drain on R, monitor HR and BP Restrictions Weight Bearing Restrictions: No      Mobility Bed Mobility Overal bed mobility: Needs Assistance        Sit to supine: Min guard     Transfers Overall transfer level: Needs assistance Equipment used: Rolling walker (2 wheeled) Transfers: Sit to/from Stand Sit to Stand: Supervision;Min guard             Balance                                            ADL                                         General ADL Comments: Pt can perform ADL with set up, sit to stand.  Ambulated back from bathroom with nursing at supervision to min guard level.  Pt did not have pain when donning socks--places leg up on bed and flexes knee.       Vision     Perception     Praxis      Pertinent Vitals/Pain Pain Assessment: No/denies pain     Hand Dominance     Extremity/Trunk Assessment Upper Extremity Assessment Upper Extremity Assessment: Generalized  weakness RUE Deficits / Details: noted R UE edema-PICC           Communication Communication Communication: No difficulties   Cognition Arousal/Alertness: Awake/alert Behavior During Therapy: WFL for tasks assessed/performed Overall Cognitive Status: Within Functional Limits for tasks assessed                     General Comments       Exercises Exercises: Other exercises Other Exercises Other Exercises: educated on and provided level 2 theraband for shoulder flexion/extension exercises.  Pt performed with cues to lengthen the band   Shoulder Instructions      Home Living Family/patient expects to be discharged to:: Private residence Living Arrangements: Spouse/significant other Available Help at Discharge: Family;Available 24 hours/day Type of Home: House             Bathroom Shower/Tub: Walk-in shower;Door   [employer] Toilet: Standard     Home Equipment: Environmental consultant - 2 wheels;Shower seat - built in;Bedside commode   Additional Comments: wife recently had  surgery      Prior Functioning/Environment Level of  Independence: Needs assistance             OT Diagnosis: Generalized weakness   OT Problem List: Decreased strength;Decreased activity tolerance;Impaired balance (sitting and/or standing)   OT Treatment/Interventions: Self-care/ADL training;Therapeutic exercise;DME and/or AE instruction;Patient/family education;Balance training    OT Goals(Current goals can be found in the care plan section) Acute Rehab OT Goals Patient Stated Goal: discharge home, get my strength back OT Goal Formulation: With patient Time For Goal Achievement: 04/12/15 Potential to Achieve Goals: Good ADL Goals Additional ADL Goal #1: pt will gather clothes and complete ADL at mod I level Additional ADL Goal #2: pt will be independent with bil UE strengthening program using level 2 theraband  OT Frequency: Min 2X/week   Barriers to D/C:            Co-evaluation               End of Session    Activity Tolerance: Patient tolerated treatment well Patient left: in bed;with call bell/phone within reach;with nursing/sitter in room   Time: 1442-1452 OT Time Calculation (min): 10 min Charges:  OT General Charges $OT Visit: 1 Procedure OT Evaluation $Initial OT Evaluation Tier I: 1 Procedure G-Codes:    Kenlie Seki 03/30/15, 3:47 PM  Marica Otter, OTR/L [phone] 2015/03/30

## 2015-03-30 ENCOUNTER — Encounter: Payer: Self-pay | Admitting: Cardiology

## 2015-03-30 DIAGNOSIS — K922 Gastrointestinal hemorrhage, unspecified: Principal | ICD-10-CM

## 2015-03-30 DIAGNOSIS — E785 Hyperlipidemia, unspecified: Secondary | ICD-10-CM

## 2015-03-30 DIAGNOSIS — N183 Chronic kidney disease, stage 3 (moderate): Secondary | ICD-10-CM

## 2015-03-30 HISTORY — DX: Hyperlipidemia, unspecified: E78.5

## 2015-03-30 LAB — CBC
HCT: 28.5 % — ABNORMAL LOW (ref 39.0–52.0)
Hemoglobin: 9.2 g/dL — ABNORMAL LOW (ref 13.0–17.0)
MCH: 31.1 pg (ref 26.0–34.0)
MCHC: 32.3 g/dL (ref 30.0–36.0)
MCV: 96.3 fL (ref 78.0–100.0)
PLATELETS: 211 10*3/uL (ref 150–400)
RBC: 2.96 MIL/uL — AB (ref 4.22–5.81)
RDW: 18.7 % — AB (ref 11.5–15.5)
WBC: 5.6 10*3/uL (ref 4.0–10.5)

## 2015-03-30 LAB — BASIC METABOLIC PANEL
ANION GAP: 6 (ref 5–15)
BUN: <5 mg/dL — ABNORMAL LOW (ref 6–20)
CALCIUM: 7.7 mg/dL — AB (ref 8.9–10.3)
CO2: 28 mmol/L (ref 22–32)
Chloride: 106 mmol/L (ref 101–111)
Creatinine, Ser: 0.96 mg/dL (ref 0.61–1.24)
Glucose, Bld: 93 mg/dL (ref 65–99)
POTASSIUM: 3.8 mmol/L (ref 3.5–5.1)
Sodium: 140 mmol/L (ref 135–145)

## 2015-03-30 LAB — GLUCOSE, CAPILLARY
GLUCOSE-CAPILLARY: 94 mg/dL (ref 65–99)
GLUCOSE-CAPILLARY: 101 mg/dL — AB (ref 65–99)

## 2015-03-30 NOTE — Progress Notes (Signed)
This encounter was created in error - please disregard.  This encounter was created in error - please disregard.

## 2015-03-30 NOTE — Progress Notes (Signed)
Pt discharged home with son in stable condition. Discharge instructions and script given. Pt and son verbalized understanding.

## 2015-03-30 NOTE — Discharge Summary (Signed)
Physician Discharge Summary  Larry [name] ZOX:[id number] DOB: July 10, 1945 DOA: 03/22/2015  PCP: [name], MD  Admit date: 03/22/2015 Discharge date: 03/30/2015  Time spent: 45 minutes  Recommendations for Outpatient Follow-up:  1. Dr.Wilson in 2 weeks  Discharge Diagnoses:    LLQ Abdominal pain   GIB (gastrointestinal bleeding)   Paroxysmal a-fib (HCC)   Acute blood loss anemia   Diabetes mellitus with renal complications (HCC)   Abdominal pain   CKD (chronic kidney disease), stage III   GI bleed   Abdominal fluid collection   Discharge Condition: stable  Diet recommendation: Diabetic  Filed Weights   03/24/15 0500 03/26/15 0403 03/30/15 0523  Weight: 106.7 kg (235 lb 3.7 oz) 110.6 kg (243 lb 13.3 oz) 109.5 kg (241 lb 6.5 oz)    History of present illness:  69 y/o male with complex surgical history, was admitted with Sepsis, acute cholecystitis, hypotension, acute renal failure, 10/06/14. He had a drain placed by IR on 10/11/14. He recovered from this and was readmitted to the hospital on 12/30/14 for elective laparoscopic cholecystectomy.  Readmitted 01/19/15 with abdominal pain, nausea, vomiting, hypotension and fever -Infected post op Hematoma, in hospital from 01/18/14-02/26/15 Complications included sepsis, worsening anemia, and transfusion. Acute renal failure with CRRT, VDRF, hypotension with pressor support, and acute encephalopathy. FLudi aspirated multiple times, finally one of the cultures grew Klebsiella and was started on Ceftriaxone and Flagyl on 9/9 for 4 weeks. Then admitted 9/22 with Abd pain, Bile Leak-underwent ERCP and Stent placement by Dr.Hung 9/22, discharged home 9/26. Re-admitted now with L sided abdominal pain starting 10/4, also had an episode of Hematemesis  Hospital Course:   1. LLQ Abdominal pain -Suspect related to intra abd fluid collection, extending to pelvis and causing peritoneal irritation -h/o Bile leak s/p ERCP and stent placement on  9/22 last admission -no acute findings on CT 10/5 -s/p US guided drainage 35 ml of cloudy bloody fluid, Cultures-now with YEAST -has completed more than 4 weeks of Abx, original stop date was 10/6, got 5 days extra antibiotics, Stopped Abx 10/10 -Po intake improving, Ambulating better -ID was consulted due to positive yeast, Dr.Comer didn't feel that therapy was warranted for this at this time. - no anticoagulation at this time because of bleeding.   2. Transient Upper GI Bleed -resolved, Underwent EGD which revealed hiatal hernia otherwise unremarkable -continue PPI  3. H/o intra abd hematoma post cholecystectomy, culture positive for Klebsiella-8/31, has been on IV Ceftriaxone and FLagyl since 9/9 for 4 weeks, completed 4 weeks 10/6, -Stopped Abx as completed course   4. Diabetes mellitus type 2 with renal complications - on lantus at home -CBGs stable (90-128) , stopped Lantus  5. Paroxysmal atrial fibrillation - presently off anticoagulation, in NSR, monitor. Follow up with PCP .   6. CAD status post stenting  - denies any chest pain, stable  7. Acute renal failure - -resolved with hydration  8. Anemia of chronic disease -slight drop due to dilution most likely, monitor as outpatient with PCP.   9. Protein calorie malnutrition -pt would like to try ensure, will try this despite H/o DM   Consultations:  CCS dr.Wilson  ID Dr.Comer  Discharge Exam: Filed Vitals:   03/30/15 0523  BP: 141/77  Pulse: 101  Temp: 98.2 F (36.8 C)  Resp: 16    General: AAOx3 Cardiovascular: S1S2/RRR Respiratory: CTAB  Discharge Instructions    Current Discharge Medication List    START taking these medications   Details  Hydrocodone-Acetaminophen 5-300 MG TABS Take 1 tablet by mouth every 6 (six) hours as needed. Qty: 30 each, Refills: 0    polyethylene glycol (MIRALAX) packet Take 17 g by mouth daily. Qty: 14 each, Refills: 0      CONTINUE these medications which  have NOT CHANGED   Details  antiseptic oral rinse (CPC / CETYLPYRIDINIUM CHLORIDE 0.05%) 0.05 % LIQD solution 7 mLs by Mouth Rinse route 2 times daily at 12 noon and 4 pm.    atorvastatin (LIPITOR) 80 MG tablet Take 80 mg by mouth at bedtime.     feeding supplement, ENSURE ENLIVE, (ENSURE ENLIVE) LIQD Take 237 mLs by mouth 2 (two) times daily between meals.    furosemide (LASIX) 40 MG tablet Take 40 mg by mouth daily.    glucose 4 GM chewable tablet Chew 1 tablet by mouth as needed for low blood sugar (only if BS IS BELOW 70).    lactobacillus acidophilus (BACID) TABS tablet Take 1 tablet by mouth 2 (two) times daily.    levalbuterol (XOPENEX) 0.63 MG/3ML nebulizer solution Take 3 mLs (0.63 mg total) by nebulization every 6 (six) hours as needed for wheezing or shortness of breath.    LORazepam (ATIVAN) 0.5 MG tablet Take 0.5 mg by mouth at bedtime.    magnesium oxide (MAG-OX) 400 MG tablet Take 400 mg by mouth 2 (two) times daily.     ondansetron (ZOFRAN-ODT) 4 MG disintegrating tablet Take 1 tablet (4 mg total) by mouth every 6 (six) hours as needed for nausea. Qty: 20 tablet, Refills: 0    pantoprazole (PROTONIX) 40 MG tablet Take 40 mg by mouth daily before breakfast.     potassium chloride SA (K-DUR,KLOR-CON) 20 MEQ tablet Take 1 tablet (20 mEq total) by mouth daily. Qty: 30 tablet, Refills: 0    vitamin C (ASCORBIC ACID) 500 MG tablet Take 500 mg by mouth 2 (two) times daily.    zinc sulfate 220 MG capsule Take 220 mg by mouth every morning.    acetaminophen (TYLENOL) 325 MG tablet Take 2 tablets (650 mg total) by mouth every 6 (six) hours as needed for mild pain, moderate pain, fever or headache.    insulin aspart (NOVOLOG) 100 UNIT/ML injection Inject 0-9 Units into the skin 3 (three) times daily with meals. CBG < 70: implement hypoglycemia protocol CBG 70 - 120: 0 units CBG 121 - 150: 1 unit CBG 151 - 200: 2 units CBG 201 - 250: 3 units CBG 251 - 300: 5 units CBG 301  - 350: 7 units CBG 351 - 400: 9 units CBG > 400: call MD.      STOP taking these medications     cefTRIAXone 2 g in dextrose 5 % 50 mL      insulin glargine (LANTUS) 100 UNIT/ML injection      Melatonin 3 MG TABS      metroNIDAZOLE (FLAGYL) 500 MG tablet      traMADol (ULTRAM) 50 MG tablet        No Known Allergies    The results of significant diagnostics from this hospitalization (including imaging, microbiology, ancillary and laboratory) are listed below for reference.    Significant Diagnostic Studies: Ct Abdomen Pelvis Wo Contrast  03/23/2015  CLINICAL DATA:  Abdominal pain.  Cholecystectomy July 2016. EXAM: CT ABDOMEN AND PELVIS WITHOUT CONTRAST TECHNIQUE: Multidetector CT imaging of the abdomen and pelvis was performed following the standard protocol without IV contrast. COMPARISON:  CT 03/11/2015, 02/16/2015 FINDINGS: Small right pleural effusion  with compressive atelectasis right lower lobe. Pigtail drainage catheter in the gallbladder fossa in good position. No significant residual fluid collection in this area. Subcapsular fluid collection lateral and posterior to the liver is unchanged and contains gas. This has been aspirated in the past and is felt to be hematoma. This extends into the right colic gutter and into the pelvis and overall is unchanged from the prior study. Remainder of the liver is normal. Spleen is normal in size Kidneys show no obstruction or mass.  Pancreas is negative. Progressive ascites throughout the abdomen most prominent in the pelvis anteriorly. Negative for bowel obstruction.  No bowel edema. No mass or adenopathy.  Atherosclerotic aorta. Lumbar degenerative change. Mild chronic fracture T9 unchanged. Grade 1 anterior slip L4-5 due to disc and facet degeneration. IMPRESSION: Pigtail drainage catheter in the gallbladder fossa without residual fluid collection Moderately large fluid collection posterior lateral to the liver extending into the pelvis.  This contains gas and is unchanged from the prior study. This has been aspirated with negative cultures. Interval increase in ascites since the prior study. Electronically Signed   By: Marlan Palauharles  Clark M.D.   On: 03/23/2015 11:26   Ct Abdomen Pelvis Wo Contrast  03/11/2015  CLINICAL DATA:  Laparoscopic cholecystectomy 01/04/2015. Large infected postop hematoma and percutaneous drain placement 02/27/2015. Bile leak. EXAM: CT ABDOMEN AND PELVIS WITHOUT CONTRAST TECHNIQUE: Multidetector CT imaging of the abdomen and pelvis was performed following the standard protocol without IV contrast. COMPARISON:  02/16/2015 FINDINGS: There are small bilateral pleural effusions with bilateral lower lobe airspace opacities, likely atelectasis. Pneumonia not completely excluded in the right lung base. Percutaneous drainage catheter is noted in the gallbladder fossa a region where there is near complete evacuation of the previously seen complex gas and fluid collection. Subcapsular fluid collection still noted and now contains gas superiorly. The fluid collection measures up to 10 cm and overall size is similar to prior study. Biliary stent is noted with pneumobilia. There is a small fluid collection anterior to the hepatic flexure which measures 2.2 cm, decreased in size when this previously measured 3.9 cm. Previously seen ascites has resolved. Spleen, pancreas, adrenals and kidneys have an unremarkable unenhanced appearance. No evidence of bowel obstruction. Urinary bladder is grossly unremarkable. Aorta is calcified, non aneurysmal. No evidence of bowel obstruction. IMPRESSION: Near complete resolution of the gallbladder fossa complex fluid collection with drainage catheter in place. Of the subcapsular fluid collection along the right lateral liver margin is initially stable in size and now contains gas superiorly. Cannot exclude infection/abscess. Near complete resolution of the ascites. Biliary stent in place.  Pneumobilia  noted. Small bilateral effusions with bibasilar atelectasis or infiltrates. Cannot exclude pneumonia in the right lower lobe. Electronically Signed   By: Charlett NoseKevin  Dover M.D.   On: 03/11/2015 15:58   Dg Abd 1 View  03/23/2015  CLINICAL DATA:  Lower left abdominal pain for 24 hr. EXAM: ABDOMEN - 1 VIEW COMPARISON:  CT 03/11/2015 FINDINGS: The biliary stent and the percutaneous cholecystostomy catheter appear unchanged in position. The abdominal gas pattern is negative for obstruction or perforation. No biliary or urinary calculi are evident. Severe lumbar degenerative changes incidentally noted. IMPRESSION: Normal abdominal gas pattern. Unchanged positions of the percutaneous cholecystostomy drain and the biliary stent. Electronically Signed   By: Ellery Plunkaniel R Mitchell M.D.   On: 03/23/2015 03:59   Nm Hepatobiliary Including Gb  03/10/2015  CLINICAL DATA:  Laparoscopic cholecystectomy 01/04/2015. Infected hematoma gallbladder fossa. Evaluate for bile leak. Surgical drain  in place. EXAM: NUCLEAR MEDICINE HEPATOBILIARY IMAGING TECHNIQUE: Sequential images of the abdomen were obtained out to 60 minutes following intravenous administration of radiopharmaceutical. RADIOPHARMACEUTICALS:  5.3 mCi Tc-23m  Choletec IV COMPARISON:  CT 02/16/2015. FINDINGS: The surgical drain was capped for the procedure. Initial images demonstrate homogeneous hepatic activity and prompt visualization of the biliary system. There is rapid accumulation of the radiopharmaceutical within the subhepatic fluid collection. Activity extends into the right pericolic gutter. This follows the distribution of fluid on prior CT. Activity also extends into the bowel. IMPRESSION: Study is positive for a bile leak with rapid accumulation of activity in the fluid collections in the subhepatic space and right paracolic gutter. No evidence of biliary obstruction. Electronically Signed   By: Carey Bullocks M.D.   On: 03/10/2015 09:22   Ct  Aspiration  03/12/2015  CLINICAL DATA:  69 year old with history of bile [place] and recently placed biliary stent. The patient has a persistent perihepatic fluid collection of uncertain etiology. Request for sampling and possible drainage of this collection. Patient already has a gallbladder fossa drain. EXAM: CT GUIDED ASPIRATION OF PERIHEPATIC FLUID COLLECTION ANESTHESIA/SEDATION: 0.5 mg versed, 25 mcg fentanyl. A radiology nurse monitored the patient for moderate sedation. Total Moderate Sedation Time: 6 minutes. PROCEDURE: The procedure was explained to the patient. The risks and benefits of the procedure were discussed and the patient's questions were addressed. Informed consent was obtained from the patient. Patient was positioned on the CT scanner with the right side elevated. CT images of the abdomen were obtained. The right lateral abdomen was prepped and draped in a sterile fashion. The skin was anesthetized with 1% lidocaine. Using CT guidance, a 19 gauge Yueh catheter was directed into the perihepatic collection. 2 mL of bloody fluid was removed. No additional fluid could be obtained. Sample was sent for Gram stain and culture. The Yueh catheter was removed and a bandage was placed at the puncture site. Complications: No immediate complication. Estimated blood loss: Minimal FINDINGS: There is a slightly irregular-shaped perihepatic fluid collection with gas along the cephalad aspect. Yueh catheter placed in this collection and only a small amount of blood could be obtained. Findings compatible with a large hematoma. IMPRESSION: CT-guided aspiration of the perihepatic collection. Findings are most compatible with a hematoma. The fluid sample was sent for Gram stain and culture. Electronically Signed   By: Richarda Overlie M.D.   On: 03/12/2015 11:27   US Paracentesis  03/24/2015  INDICATION: Ascites, request for paracentesis. EXAM: ULTRASOUND-GUIDED PARACENTESIS COMPARISON:  Paracentesis 02/16/2015.  MEDICATIONS: None. COMPLICATIONS: None immediate TECHNIQUE: Informed written consent was obtained from the patient after a discussion of the risks, benefits and alternatives to treatment. A timeout was performed prior to the initiation of the procedure. Initial ultrasound scanning demonstrates a small amount of ascites within the left midline abdominal region. The left midline abdomen was prepped and draped in the usual sterile fashion. 1% lidocaine was used for local anesthesia. Under direct ultrasound guidance, a 19 gauge, 10-cm, Yueh catheter was introduced. An ultrasound image was saved for documentation purposed. The paracentesis was performed. The catheter was removed and a dressing was applied. The patient tolerated the procedure well without immediate post procedural complication. FINDINGS: A total of approximately 35 ml of cloudy bloody fluid was removed. Samples were sent to the laboratory as requested by the clinical team. IMPRESSION: Successful ultrasound-guided paracentesis yielding 35 ml of peritoneal fluid. Read By:  Pattricia Boss PA-C Electronically Signed   By: Gilmer Mor D.O.  On: 03/24/2015 12:08   Dg Chest Portable 1 View  03/22/2015  CLINICAL DATA:  Abdominal pain EXAM: PORTABLE CHEST 1 VIEW COMPARISON:  02/22/2015 FINDINGS: Stable right PICC. NG tube removed. Bibasilar atelectasis is stable. Upper lungs clear. Normal heart size. No pneumothorax. IMPRESSION: Stable bibasilar atelectasis. Electronically Signed   By: Jolaine Click M.D.   On: 03/22/2015 23:33   Dg Ercp Biliary & Pancreatic Ducts  03/10/2015  CLINICAL DATA:  69 year old male with potential common bile duct stones. EXAM: ERCP TECHNIQUE: Multiple spot images obtained with the fluoroscopic device and submitted for interpretation post-procedure. FLUOROSCOPY TIME:  If the device does not provide the exposure index: Fluoroscopy Time:  1.18 minutes Number of Acquired Images:  4 COMPARISON:  CT the abdomen and pelvis 02/16/2015.  FINDINGS: Four images of the ERCP are provided for evaluation. Initial image demonstrates a pigtail drainage catheter in the right upper quadrant of the abdomen, presumably within the gallbladder fossa. Endoscope in position with cannulation of the Sphincter of Oddi and the guidewire extending into the distal common bile duct. Second image demonstrates similar findings. Third image demonstrates advancement of a guidewire into the intrahepatic biliary tree, and apparent extension of the guidewire into the region of the gallbladder fossa coming in close proximity to the indwelling catheter. Notably, injection at this time demonstrates opacification of the common bile duct and a portion of the intrahepatic biliary tree (common bile duct is poorly opacified), and there appears to be a extravasation of contrast material into the gallbladder fossa around the pigtail drainage catheter. The fourth image demonstrates placement of a common bile duct stent which appears appropriately located, withdrawal of the endoscope, and persistence of contrast material within the gallbladder fossa. IMPRESSION: 1. ERCP images document placement of CBD stent, as above. 2. Extravasation of contrast material into the gallbladder fossa, presumably within a persistent biloma. 3. Images provided were insufficient to accurately assess for the presence or absence of common bile duct stones. Please refer to procedural note from ERCP for further details. These images were submitted for radiologic interpretation only. Please see the procedural report for the amount of contrast and the fluoroscopy time utilized. Electronically Signed   By: Trudie Reed M.D.   On: 03/10/2015 17:41    Microbiology: Recent Results (from the past 240 hour(s))  MRSA PCR Screening     Status: None   Collection Time: 03/23/15  4:03 PM  Result Value Ref Range Status   MRSA by PCR NEGATIVE NEGATIVE Final    Comment:        The GeneXpert MRSA Assay (FDA approved  for NASAL specimens only), is one component of a comprehensive MRSA colonization surveillance program. It is not intended to diagnose MRSA infection nor to guide or monitor treatment for MRSA infections.   Culture, body fluid-bottle     Status: None (Preliminary result)   Collection Time: 03/24/15 10:53 AM  Result Value Ref Range Status   Specimen Description FLUID ABDOMEN  Final   Special Requests AEB  Final   Gram Stain   Final    BUDDING YEAST SEEN AEROBIC BOTTLE ONLY CRITICAL RESULT CALLED TO, READ BACK BY AND VERIFIED WITH: E ACQUAAH,RN AT 1624 03/28/15 BY L BENFIELD    Culture   Final    YEAST CULTURE REINCUBATED FOR BETTER GROWTH Performed at [facility]    Report Status PENDING  Incomplete  Gram stain     Status: None   Collection Time: 03/24/15 10:53 AM  Result Value  Ref Range Status   Specimen Description FLUID ABDOMEN  Final   Special Requests NONE  Final   Gram Stain   Final    FEW WBC PRESENT, PREDOMINANTLY PMN NO ORGANISMS SEEN Performed at [facility]    Report Status 03/24/2015 FINAL  Final     Labs: Basic Metabolic Panel:  Recent Labs Lab 03/25/15 0400 03/26/15 0900 03/27/15 0436 03/29/15 0542 03/30/15 0515  NA 141 141 143 141 140  K 4.1 3.9 3.5 3.5 3.8  CL 109 109 113* 110 106  CO2 GLUCOSE 110* 84 111* 111* 93  BUN 5* <5*  CREATININE 1.17 1.08 0.96 0.93 0.96  CALCIUM 7.0* 7.2* 6.7* 7.5* 7.7*   Liver Function Tests:  Recent Labs Lab 03/23/15 1645 03/25/15 0400 03/26/15 0900 03/29/15 0542  AST ALT 10* 9* 7* 6*  ALKPHOS 80 67 65 66  BILITOT 0.6 0.5 0.7 0.6  PROT 5.8* 5.1* 5.1* 4.9*  ALBUMIN 2.1* 1.7* 1.8* 1.7*   No results for input(s): LIPASE, AMYLASE in the last 168 hours. No results for input(s): AMMONIA in the last 168 hours. CBC:  Recent Labs Lab 03/25/15 0400 03/26/15 0900 03/27/15 0436 03/29/15 0542 03/30/15 0515  WBC 8.6 7.1 5.8 5.4 5.6  NEUTROABS  --   5.3  --   --   --   HGB 8.5* 8.7* 8.2* 8.2* 9.2*  HCT 28.0* 28.2* 26.8* 25.7* 28.5*  MCV 97.9 97.6 98.2 95.2 96.3  PLT 222 234 213 186 211   Cardiac Enzymes: No results for input(s): CKTOTAL, CKMB, CKMBINDEX, TROPONINI in the last 168 hours. BNP: BNP (last 3 results)  Recent Labs  03/10/15 1529  BNP 163.1*    ProBNP (last 3 results) No results for input(s): PROBNP in the last 8760 hours.  CBG:  Recent Labs Lab 03/29/15 0738 03/29/15 1153 03/29/15 1743 03/29/15 2209 03/30/15 0758  GLUCAP 91 111* 126* 109* 94       Signed:  Devina Bezold  [facility] 03/30/2015, 9:52 AM

## 2015-03-30 NOTE — Progress Notes (Signed)
6 Days Post-Op  Subjective: No abd pain. No emesis. Walked. Had some nausea with Breeze o/w no complaints  Objective: Vital signs in last 24 hours: Temp:  [98.1 F (36.7 C)-98.5 F (36.9 C)] 98.2 F (36.8 C) (10/12 0523) Pulse Rate:  [100-112] 101 (10/12 0523) Resp:  [16-18] 16 (10/12 0523) BP: (107-141)/(55-77) 141/77 mmHg (10/12 0523) SpO2:  [95 %] 95 % (10/12 0523) Weight:  [109.5 kg (241 lb 6.5 oz)] 109.5 kg (241 lb 6.5 oz) (10/12 0523) Last BM Date: 03/29/15  Intake/Output from previous day: 10/11 0701 - 10/12 0700 In: -  Out: 60  Intake/Output this shift: Total I/O In: -  Out: 40 [Drains:40]  Alert, nad Resting comfortably Soft, obese, nt, nd; drain - bilious  Lab Results:   Recent Labs  03/29/15 0542 03/30/15 0515  WBC 5.4 5.6  HGB 8.2* 9.2*  HCT 25.7* 28.5*  PLT 186 211   BMET  Recent Labs  03/29/15 0542 03/30/15 0515  NA 141 140  K 3.5 3.8  CL 110 106  CO2 27 28  GLUCOSE 111* 93  BUN 5* <5*  CREATININE 0.93 0.96  CALCIUM 7.5* 7.7*   PT/INR No results for input(s): LABPROT, INR in the last 72 hours. ABG No results for input(s): PHART, HCO3 in the last 72 hours.  Invalid input(s): PCO2, PO2  Studies/Results: No results found.  Anti-infectives: Anti-infectives    Start     Dose/Rate Route Frequency Ordered Stop   03/28/15 1800  fluconazole (DIFLUCAN) IVPB 200 mg  Status:  Discontinued     200 mg 100 mL/hr over 60 Minutes Intravenous Every 24 hours 03/28/15 1726 03/29/15 1430   03/23/15 1800  metroNIDAZOLE (FLAGYL) IVPB 500 mg  Status:  Discontinued     500 mg 100 mL/hr over 60 Minutes Intravenous Every 8 hours 03/23/15 1640 03/28/15 1044   03/23/15 1430  cefTRIAXone (ROCEPHIN) 2 g in dextrose 5 % 50 mL IVPB  Status:  Discontinued     2 g 100 mL/hr over 30 Minutes Intravenous Every 24 hours 03/23/15 1422 03/28/15 1044   03/23/15 0745  metroNIDAZOLE (FLAGYL) IVPB 500 mg  Status:  Discontinued     500 mg 100 mL/hr over 60 Minutes  Intravenous Every 6 hours 03/23/15 0744 03/23/15 1335      Assessment/Plan: s/p Procedure(s): ESOPHAGOGASTRODUODENOSCOPY (EGD) (N/A)  Bile leak s/p ERCP/stent. Drain output ok. anticiptate output will go down as his nutrition improves Old perihepatic hematoma - stable, Nothing to do Ascites s/p paracentesis - grew yeast. No fever, no wbc, not clinically ill, wasn't organized fluid collection. Agree with no abx GI bleed - no sure of source. egd clean. hgb completely stable. No transfusion. PPI on dc  i think PICC  Can be removed.  Would cont to hold oral anticoagulation on dc given bleed in Sept Ok with discharge Needs to cont to work on nutrition as outpt.  F/u with me 2 weeks  Mary SellaEric M. Andrey CampanileWilson, MD, FACS General, Bariatric, & Minimally Invasive Surgery [facility] [place]   LOS: 7 days    Atilano InaWILSON,Kastin Cerda M 03/30/2015

## 2015-03-31 ENCOUNTER — Encounter: Payer: Medicare Other | Admitting: Cardiology

## 2015-03-31 ENCOUNTER — Other Ambulatory Visit: Payer: Self-pay | Admitting: Radiology

## 2015-03-31 DIAGNOSIS — IMO0001 Reserved for inherently not codable concepts without codable children: Secondary | ICD-10-CM

## 2015-03-31 DIAGNOSIS — K651 Peritoneal abscess: Principal | ICD-10-CM

## 2015-03-31 DIAGNOSIS — T814XXD Infection following a procedure, subsequent encounter: Principal | ICD-10-CM

## 2015-03-31 LAB — CULTURE, BODY FLUID-BOTTLE

## 2015-03-31 LAB — CULTURE, BODY FLUID W GRAM STAIN -BOTTLE

## 2015-04-11 ENCOUNTER — Other Ambulatory Visit: Payer: Self-pay | Admitting: Radiology

## 2015-04-11 ENCOUNTER — Other Ambulatory Visit: Payer: Self-pay | Admitting: General Surgery

## 2015-04-11 DIAGNOSIS — IMO0001 Reserved for inherently not codable concepts without codable children: Secondary | ICD-10-CM

## 2015-04-11 DIAGNOSIS — K651 Peritoneal abscess: Principal | ICD-10-CM

## 2015-04-11 DIAGNOSIS — T814XXD Infection following a procedure, subsequent encounter: Principal | ICD-10-CM

## 2015-04-19 ENCOUNTER — Ambulatory Visit
Admission: RE | Admit: 2015-04-19 | Discharge: 2015-04-19 | Disposition: A | Discharge status: Home / Self Care | Payer: Medicare Other | Source: Ambulatory Visit | Attending: Radiology | Admitting: Radiology

## 2015-04-19 ENCOUNTER — Other Ambulatory Visit: Payer: Self-pay | Admitting: Diagnostic Radiology

## 2015-04-19 DIAGNOSIS — IMO0001 Reserved for inherently not codable concepts without codable children: Secondary | ICD-10-CM

## 2015-04-19 DIAGNOSIS — T814XXD Infection following a procedure, subsequent encounter: Principal | ICD-10-CM

## 2015-04-19 DIAGNOSIS — K651 Peritoneal abscess: Principal | ICD-10-CM

## 2015-04-19 DIAGNOSIS — T814XXS Infection following a procedure, sequela: Principal | ICD-10-CM

## 2015-04-19 MED ORDER — IOPAMIDOL (ISOVUE-300) INJECTION 61%
125.0000 mL | Freq: Once | INTRAVENOUS | Status: AC | PRN
  Administered 2015-04-19: 125 mL via INTRAVENOUS
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
## 2015-04-25 NOTE — Patient Instructions (Signed)
[facility]  Spoke with Crystal.

## 2015-04-26 ENCOUNTER — Other Ambulatory Visit: Payer: Self-pay | Admitting: Diagnostic Radiology

## 2015-04-26 ENCOUNTER — Other Ambulatory Visit (HOSPITAL_COMMUNITY): Payer: Self-pay | Admitting: Diagnostic Radiology

## 2015-04-26 ENCOUNTER — Ambulatory Visit (HOSPITAL_COMMUNITY)
Admission: RE | Admit: 2015-04-26 | Discharge: 2015-04-26 | Disposition: A | Discharge status: Home / Self Care | Payer: Medicare Other | Source: Ambulatory Visit | Attending: Diagnostic Radiology | Admitting: Diagnostic Radiology

## 2015-04-26 DIAGNOSIS — T814XXS Infection following a procedure, sequela: Principal | ICD-10-CM

## 2015-04-26 DIAGNOSIS — K651 Peritoneal abscess: Principal | ICD-10-CM

## 2015-04-26 DIAGNOSIS — IMO0001 Reserved for inherently not codable concepts without codable children: Secondary | ICD-10-CM

## 2015-04-26 DIAGNOSIS — K819 Cholecystitis, unspecified: Secondary | ICD-10-CM

## 2015-04-26 DIAGNOSIS — Z4803 Encounter for change or removal of drains: Secondary | ICD-10-CM | POA: Diagnosis not present

## 2015-04-26 MED ORDER — IOHEXOL 300 MG/ML  SOLN
50.0000 mL | Freq: Once | INTRAMUSCULAR | Status: DC | PRN
  Administered 2015-04-26: 10 mL via INTRAVENOUS
  Filled 2015-04-26: qty 50

## 2015-04-26 MED ORDER — LIDOCAINE HCL 1 % IJ SOLN
INTRAMUSCULAR | Status: AC
  Filled 2015-04-26: qty 20

## 2015-05-11 ENCOUNTER — Encounter: Payer: Medicare Other | Admitting: Cardiology

## 2015-05-11 DIAGNOSIS — R0989 Other specified symptoms and signs involving the circulatory and respiratory systems: Secondary | ICD-10-CM

## 2015-05-11 NOTE — Progress Notes (Signed)
This encounter was created in error - please disregard.  This encounter was created in error - please disregard.

## 2015-05-17 ENCOUNTER — Encounter: Payer: Self-pay | Admitting: Cardiology

## 2015-06-27 ENCOUNTER — Other Ambulatory Visit: Payer: Self-pay | Admitting: Gastroenterology

## 2015-06-30 NOTE — Progress Notes (Signed)
Mr Larry [name] reports that he does not take any medication at all since discharge from the hospital in 03/2015.  Patient reports that CBG's run 100-120.

## 2015-07-01 ENCOUNTER — Encounter (HOSPITAL_COMMUNITY)
Admission: RE | Disposition: A | Discharge status: Home / Self Care | Payer: Self-pay | Source: Ambulatory Visit | Attending: Gastroenterology

## 2015-07-01 ENCOUNTER — Ambulatory Visit (HOSPITAL_COMMUNITY): Payer: Medicare Other | Admitting: Anesthesiology

## 2015-07-01 ENCOUNTER — Encounter (HOSPITAL_COMMUNITY): Payer: Self-pay | Admitting: *Deleted

## 2015-07-01 ENCOUNTER — Ambulatory Visit (HOSPITAL_COMMUNITY)
Admission: RE | Admit: 2015-07-01 | Discharge: 2015-07-01 | Disposition: A | Discharge status: Home / Self Care | Payer: Medicare Other | Source: Ambulatory Visit | Attending: Gastroenterology | Admitting: Gastroenterology

## 2015-07-01 ENCOUNTER — Ambulatory Visit (HOSPITAL_COMMUNITY): Payer: Medicare Other

## 2015-07-01 DIAGNOSIS — I252 Old myocardial infarction: Secondary | ICD-10-CM | POA: Insufficient documentation

## 2015-07-01 DIAGNOSIS — T85590A Other mechanical complication of bile duct prosthesis, initial encounter: Secondary | ICD-10-CM

## 2015-07-01 DIAGNOSIS — E785 Hyperlipidemia, unspecified: Secondary | ICD-10-CM | POA: Diagnosis not present

## 2015-07-01 DIAGNOSIS — E119 Type 2 diabetes mellitus without complications: Secondary | ICD-10-CM | POA: Insufficient documentation

## 2015-07-01 DIAGNOSIS — Z466 Encounter for fitting and adjustment of urinary device: Secondary | ICD-10-CM | POA: Diagnosis not present

## 2015-07-01 DIAGNOSIS — K8050 Calculus of bile duct without cholangitis or cholecystitis without obstruction: Secondary | ICD-10-CM | POA: Insufficient documentation

## 2015-07-01 DIAGNOSIS — I10 Essential (primary) hypertension: Secondary | ICD-10-CM | POA: Diagnosis not present

## 2015-07-01 DIAGNOSIS — I2510 Atherosclerotic heart disease of native coronary artery without angina pectoris: Secondary | ICD-10-CM | POA: Diagnosis not present

## 2015-07-01 HISTORY — PX: ERCP: SHX5425

## 2015-07-01 LAB — GLUCOSE, CAPILLARY: Glucose-Capillary: 124 mg/dL — ABNORMAL HIGH (ref 65–99)

## 2015-07-01 SURGERY — ERCP, WITH INTERVENTION IF INDICATED
Anesthesia: General

## 2015-07-01 MED ORDER — FENTANYL CITRATE (PF) 100 MCG/2ML IJ SOLN
INTRAMUSCULAR | Status: DC | PRN
  Administered 2015-07-01: 50 ug via INTRAVENOUS
  Administered 2015-07-01: 100 ug via INTRAVENOUS
  Administered 2015-07-01: 50 ug via INTRAVENOUS

## 2015-07-01 MED ORDER — PROPOFOL 10 MG/ML IV BOLUS
INTRAVENOUS | Status: DC | PRN
  Administered 2015-07-01: 200 mg via INTRAVENOUS

## 2015-07-01 MED ORDER — MIDAZOLAM HCL 5 MG/5ML IJ SOLN
INTRAMUSCULAR | Status: DC | PRN
  Administered 2015-07-01: 2 mg via INTRAVENOUS

## 2015-07-01 MED ORDER — LACTATED RINGERS IV SOLN
INTRAVENOUS | Status: DC
  Administered 2015-07-01: 13:00:00 via INTRAVENOUS
  Administered 2015-07-01: 1000 mL via INTRAVENOUS

## 2015-07-01 MED ORDER — ONDANSETRON HCL 4 MG/2ML IJ SOLN
INTRAMUSCULAR | Status: DC | PRN
  Administered 2015-07-01: 4 mg via INTRAVENOUS

## 2015-07-01 MED ORDER — ROCURONIUM BROMIDE 100 MG/10ML IV SOLN
INTRAVENOUS | Status: DC | PRN
  Administered 2015-07-01: 10 mg via INTRAVENOUS

## 2015-07-01 MED ORDER — SUGAMMADEX SODIUM 200 MG/2ML IV SOLN
INTRAVENOUS | Status: DC | PRN
  Administered 2015-07-01: 200 mg via INTRAVENOUS

## 2015-07-01 MED ORDER — IOHEXOL 350 MG/ML SOLN
INTRAVENOUS | Status: DC | PRN
  Administered 2015-07-01: 40 mL

## 2015-07-01 MED ORDER — SODIUM CHLORIDE 0.9 % IV SOLN: INTRAVENOUS | Status: DC

## 2015-07-01 MED ORDER — GLUCAGON HCL RDNA (DIAGNOSTIC) 1 MG IJ SOLR
INTRAMUSCULAR | Status: AC
  Filled 2015-07-01: qty 1

## 2015-07-01 NOTE — Anesthesia Preprocedure Evaluation (Signed)
Anesthesia Evaluation  Patient identified by MRN, date of birth, ID band Patient awake    Reviewed: Allergy & Precautions, H&P , NPO status , Patient's Chart, lab work & pertinent test results  Airway Mallampati: I  TM Distance: >3 FB Neck ROM: full    Dental  (+) Poor Dentition   Pulmonary neg pulmonary ROS,    Pulmonary exam normal        Cardiovascular Exercise Tolerance: Good hypertension, + CAD  Normal cardiovascular exam  Nl LVEF and function per 09/2014 echo   Neuro/Psych negative neurological ROS     GI/Hepatic negative GI ROS, Neg liver ROS,   Endo/Other  negative endocrine ROSdiabetes, Insulin DependentBS 124 today  Renal/GU   negative genitourinary   Musculoskeletal   Abdominal   Peds  Hematology   Anesthesia Other Findings   Reproductive/Obstetrics negative OB ROS                             Anesthesia Physical Anesthesia Plan  ASA: III  Anesthesia Plan: General   Post-op Pain Management:    Induction: Intravenous  Airway Management Planned: Oral ETT  Additional Equipment:   Intra-op Plan:   Post-operative Plan: Extubation in OR  Informed Consent: I have reviewed the patients History and Physical, chart, labs and discussed the procedure including the risks, benefits and alternatives for the proposed anesthesia with the patient or authorized representative who has indicated his/her understanding and acceptance.   Dental Advisory Given  Plan Discussed with: CRNA and Surgeon  Anesthesia Plan Comments:         Anesthesia Quick Evaluation

## 2015-07-01 NOTE — Op Note (Signed)
[facility] [address] [place], [id number]   ERCP PROCEDURE REPORT        EXAM DATE: 07/01/2015  PATIENT NAME:          Larry [name], Larry [name]          MR #: [id number] BIRTHDATE:       1946-04-24     VISIT #:     [id number] ATTENDING:     Jeani HawkingPatrick Esmond Hinch, MD     STATUS:     outpatient ASSISTANT:      Arlee Muslimhandler, Chris and Will BonnetWinchester, Katie   INDICATIONS:  The patient is a 70 yr old male here for an ERCP due to stent removal PROCEDURE PERFORMED:     ERCP with removal of calculus/calculi  MEDICATIONS:     General Anesthesia and Indomethacin 100 mg PR.   CONSENT: The patient understands the risks and benefits of the procedure and understands that these risks include, but are not limited to: sedation, allergic reaction, infection, perforation and/or bleeding. Alternative means of evaluation and treatment include, among others: physical exam, x-rays, and/or surgical intervention. The patient elects to proceed with this endoscopic procedure.  DESCRIPTION OF PROCEDURE: During intra-op preparation period all mechanical & medical equipment was checked for proper function. Hand hygiene and appropriate measures for infection prevention was taken. After the risks, benefits and alternatives of the procedure were thoroughly explained, Informed was verified, confirmed and timeout was successfully executed by the treatment team. With the patient in left semi-prone position, medications were administered intravenously.The Pentax Ercp Scope [id number] was passed from the mouth into the esophagus and further advanced from the esophagus into the stomach. From stomach scope was directed to the second portion of the duodenum.  Major papilla was aligned with the duodenoscope. The scope position was confirmed fluoroscopically. Rest of the findings/therapeutics are given below. The scope was then completely withdrawn from the patient and the procedure completed. The pulse,  BP, and O2 saturation were monitored and documented by the physician and the nursing staff throughout the entire procedure. The patient was cared for as planned according to standard protocol. The patient was then discharged to recovery in stable condition and with appropriate post procedure care. Estimated blood loss is zero unless otherwise noted in this procedure report.  The ampulla was located the second portion of the duodenum and the plastic biliary stent was found to be extending far out of the ampulla.  The stent was removed with a snare and then the CBD was cannulated.  The guidewire was secured in the right intrahepatic ducts and contrast injection revealed a dilated CBD at 2 cm.  No evidence of any biliary ductal leaks.  In the distal CBD there was evidence of a filling defect.  I was not certain if this was secondary to the periampullary diverticulum versus a retained stone.  A sphincterotomy was created and the CBD was swept 4 times. No evidence of any stones.  Minor bleeding was noted at the ampulla with stone extraction and the bleeding arrested wtih further observation.    ADVERSE EVENT:     None IMPRESSIONS:     1) Dilated CBD. 2) No evidence of any biliary ductal leak.  RECOMMENDATIONS:     1) Follow up PRN. REPEAT EXAM:  ___________________________________ Jeani HawkingPatrick Surya Schroeter, MD eSigned:  Jeani HawkingPatrick Denina Rieger, MD 07/01/2015 1:53 PM   cc:  CPT CODES: ICD9 CODES:

## 2015-07-01 NOTE — Anesthesia Procedure Notes (Signed)
Procedure Name: Intubation Date/Time: 07/01/2015 12:58 PM Performed by: Fransisca KaufmannMEYER, Cesilia Shinn E Pre-anesthesia Checklist: Patient identified, Emergency Drugs available, Suction available, Patient being monitored and Timeout performed Patient Re-evaluated:Patient Re-evaluated prior to inductionOxygen Delivery Method: Circle system utilized Preoxygenation: Pre-oxygenation with 100% oxygen Intubation Type: IV induction Ventilation: Mask ventilation without difficulty Grade View: Grade II Tube type: Oral Tube size: 7.5 mm Number of attempts: 1 Airway Equipment and Method: Stylet Placement Confirmation: ETT inserted through vocal cords under direct vision,  positive ETCO2 and breath sounds checked- equal and bilateral Secured at: 23 cm Tube secured with: Tape Dental Injury: Teeth and Oropharynx as per pre-operative assessment  Comments: Poor dentition

## 2015-07-01 NOTE — Transfer of Care (Signed)
Immediate Anesthesia Transfer of Care Note  Patient: Larry RombergKenneth R [name]  Procedure(s) Performed: Procedure(s): ENDOSCOPIC RETROGRADE CHOLANGIOPANCREATOGRAPHY (ERCP) (N/A)  Patient Location: PACU and Endoscopy Unit  Anesthesia Type:General  Level of Consciousness: awake, alert , oriented and sedated  Airway & Oxygen Therapy: Patient Spontanous Breathing and Patient connected to nasal cannula oxygen  Post-op Assessment: Report given to RN, Post -op Vital signs reviewed and stable and Patient moving all extremities  Post vital signs: Reviewed and stable  Last Vitals:  Filed Vitals:   07/01/15 1129 07/01/15 1404  BP: 162/85 148/41  Temp: 36.6 C 36.4 C  Resp: 10 11    Complications: No apparent anesthesia complications

## 2015-07-01 NOTE — H&P (Signed)
  Larry RombergKenneth R [name] HPI: This is a 70 year old male s/p lap chole with prolonged post-operative complications and s/p ERCP with stent placement.  No further bile leak or infectious issues.  He is here today for stent removal.  Past Medical History  Diagnosis Date  . Hypertension   . Atrial fibrillation (HCC) 10-01-14  . Coronary artery disease   . Obesity   . Multiple fractures     history of -all over 20 yrs ago-"fell off cliff', "history vertebrae fractures"  . Cholecystostomy care [facility](HCC)     Cholecystostomy Tube RUQ of abdomen to drainage bag.  . MI (myocardial infarction) [facility](HCC)     saw Dr. Carlene CoriaLenze,[facility] 12-16-14 Epic notes.  . Diabetes mellitus without complication [facility](HCC)     [place] [facility] Dr. Randa EvensEdwards [phone]  . Dyslipidemia 03/30/2015    Past Surgical History  Procedure Laterality Date  . Revision total hip arthroplasty Left   . Knee replacement Left   . Cardiac stent    . Cardiac catheterization      with stent placement in 2014  . Ankle surgery Left     left foot -retained hardware  . Joint replacement      LTHA, LTKA  . Cholecystectomy N/A 01/04/2015    Procedure: LAPAROSCOPIC CHOLECYSTECTOMY WITH CHOLANGIOGRAM;  Surgeon: Gaynelle AduEric Wilson, MD;  Location: [facility];  Service: General;  Laterality: N/A;  . Ercp N/A 03/10/2015    Procedure: ENDOSCOPIC RETROGRADE CHOLANGIOPANCREATOGRAPHY (ERCP);  Surgeon: Jeani HawkingPatrick Onyinyechi Huante, MD;  Location: [facility];  Service: Gastroenterology;  Laterality: N/A;  . Esophagogastroduodenoscopy N/A 03/24/2015    Procedure: ESOPHAGOGASTRODUODENOSCOPY (EGD);  Surgeon: Jeani HawkingPatrick Jakaria Lavergne, MD;  [facility];  Service: Endoscopy;  Laterality: N/A;    Family History  Problem Relation Age of Onset  . Heart disease Father     90 yrs deceased  . Heart failure Father   . Heart attack Father   . Diabetes Sister   . Diabetes Son   . Diabetes Daughter     Social History:  reports that he has never smoked. He has never used smokeless tobacco.  He reports that he does not drink alcohol or use illicit drugs.  Allergies: No Known Allergies  Medications: Scheduled: Continuous:  No results found for this or any previous visit (from the past 24 hour(s)).   No results found.  ROS:  As stated above in the HPI otherwise negative.  There were no vitals taken for this visit.    PE: Gen: NAD, Alert and Oriented HEENT:  [place]/AT, EOMI Neck: Supple, no LAD Lungs: CTA Bilaterally CV: RRR without M/G/R ABM: Soft, NTND, +BS Ext: No C/C/E  Assessment/Plan: 1) Biliary stent - ERCP with stent removal.  Palin Tristan D 07/01/2015, 9:25 AM

## 2015-07-01 NOTE — Anesthesia Postprocedure Evaluation (Signed)
Anesthesia Post Note  Patient: Larry RombergKenneth R [name]  Procedure(s) Performed: Procedure(s) (LRB): ENDOSCOPIC RETROGRADE CHOLANGIOPANCREATOGRAPHY (ERCP) (N/A)  Patient location during evaluation: PACU Anesthesia Type: General Level of consciousness: sedated Pain management: pain level controlled Vital Signs Assessment: post-procedure vital signs reviewed and stable Respiratory status: spontaneous breathing Cardiovascular status: stable Postop Assessment: no signs of nausea or vomiting Anesthetic complications: no    Last Vitals:  Filed Vitals:   07/01/15 1411 07/01/15 1423  BP: 149/79 157/73  Temp:    Resp: 18 18    Last Pain: There were no vitals filed for this visit.               Miakoda Mcmillion JR,JOHN Susann GivensFRANKLIN

## 2015-07-01 NOTE — Discharge Instructions (Signed)
Endoscopic Retrograde Cholangiopancreatography (ERCP), Care After °Refer to this sheet in the next few weeks. These instructions provide you with information on caring for yourself after your procedure. Your health care provider may also give you more specific instructions. Your treatment has been planned according to current medical practices, but problems sometimes occur. Call your health care provider if you have any problems or questions after your procedure.  °WHAT TO EXPECT AFTER THE PROCEDURE  °After your procedure, it is typical to feel:  °· Soreness in your throat.   °· Sick to your stomach (nauseous).   °· Bloated. °· Dizzy.   °· Fatigued. °HOME CARE INSTRUCTIONS °· Have a friend or family member stay with you for the first 24 hours after your procedure. °· Start taking your usual medicines and eating normally as soon as you feel well enough to do so or as directed by your health care provider. °SEEK MEDICAL CARE IF: °· You have abdominal pain.   °· You develop signs of infection, such as:   °¨ Chills.   °¨ Feeling unwell.   °SEEK IMMEDIATE MEDICAL CARE IF: °· You have difficulty swallowing. °· You have worsening throat, chest, or abdominal pain. °· You vomit. °· You have bloody or very black stools. °· You have a fever. °  °This information is not intended to replace advice given to you by your health care provider. Make sure you discuss any questions you have with your health care provider. °  °Document Released: 03/25/2013 Document Reviewed: 03/25/2013 °Elsevier Interactive Patient Education ©2016 [employer] ° °

## 2015-07-04 ENCOUNTER — Encounter (HOSPITAL_COMMUNITY): Payer: Self-pay | Admitting: Gastroenterology

## 2015-11-20 ENCOUNTER — Emergency Department (HOSPITAL_BASED_OUTPATIENT_CLINIC_OR_DEPARTMENT_OTHER): Payer: Medicare Other

## 2015-11-20 ENCOUNTER — Encounter (HOSPITAL_BASED_OUTPATIENT_CLINIC_OR_DEPARTMENT_OTHER): Payer: Self-pay | Admitting: *Deleted

## 2015-11-20 DIAGNOSIS — I252 Old myocardial infarction: Secondary | ICD-10-CM | POA: Diagnosis not present

## 2015-11-20 DIAGNOSIS — W228XXA Striking against or struck by other objects, initial encounter: Secondary | ICD-10-CM | POA: Diagnosis not present

## 2015-11-20 DIAGNOSIS — I10 Essential (primary) hypertension: Secondary | ICD-10-CM | POA: Insufficient documentation

## 2015-11-20 DIAGNOSIS — E119 Type 2 diabetes mellitus without complications: Secondary | ICD-10-CM | POA: Diagnosis not present

## 2015-11-20 DIAGNOSIS — E785 Hyperlipidemia, unspecified: Secondary | ICD-10-CM | POA: Diagnosis not present

## 2015-11-20 DIAGNOSIS — W1781XA Fall down embankment (hill), initial encounter: Secondary | ICD-10-CM | POA: Insufficient documentation

## 2015-11-20 DIAGNOSIS — I2510 Atherosclerotic heart disease of native coronary artery without angina pectoris: Secondary | ICD-10-CM | POA: Insufficient documentation

## 2015-11-20 DIAGNOSIS — Z96652 Presence of left artificial knee joint: Secondary | ICD-10-CM | POA: Insufficient documentation

## 2015-11-20 DIAGNOSIS — E669 Obesity, unspecified: Secondary | ICD-10-CM | POA: Diagnosis not present

## 2015-11-20 DIAGNOSIS — S2242XA Multiple fractures of ribs, left side, initial encounter for closed fracture: Principal | ICD-10-CM | POA: Insufficient documentation

## 2015-11-20 DIAGNOSIS — Z96642 Presence of left artificial hip joint: Secondary | ICD-10-CM | POA: Diagnosis not present

## 2015-11-20 NOTE — ED Notes (Signed)
Pt reports that he fell while walking today in the woods.  Reports left upper back and side pain since that time.  Ambulatory.  BS clear bilaterally.

## 2015-11-21 ENCOUNTER — Encounter (HOSPITAL_BASED_OUTPATIENT_CLINIC_OR_DEPARTMENT_OTHER): Payer: Self-pay | Admitting: Emergency Medicine

## 2015-11-21 ENCOUNTER — Observation Stay (HOSPITAL_BASED_OUTPATIENT_CLINIC_OR_DEPARTMENT_OTHER)
Admission: EM | Admit: 2015-11-21 | Discharge: 2015-11-21 | Disposition: A | Discharge status: Home / Self Care | Payer: Medicare Other | Attending: Surgery | Admitting: Surgery

## 2015-11-21 ENCOUNTER — Emergency Department (HOSPITAL_BASED_OUTPATIENT_CLINIC_OR_DEPARTMENT_OTHER): Payer: Medicare Other

## 2015-11-21 DIAGNOSIS — S2232XA Fracture of one rib, left side, initial encounter for closed fracture: Secondary | ICD-10-CM | POA: Diagnosis present

## 2015-11-21 DIAGNOSIS — W19XXXA Unspecified fall, initial encounter: Secondary | ICD-10-CM

## 2015-11-21 LAB — BASIC METABOLIC PANEL
ANION GAP: 8 (ref 5–15)
BUN: 33 mg/dL — ABNORMAL HIGH (ref 6–20)
CALCIUM: 8.6 mg/dL — AB (ref 8.9–10.3)
CO2: 21 mmol/L — ABNORMAL LOW (ref 22–32)
CREATININE: 1.55 mg/dL — AB (ref 0.61–1.24)
Chloride: 108 mmol/L (ref 101–111)
GFR, EST AFRICAN AMERICAN: 51 mL/min — AB (ref >60)
GFR, EST NON AFRICAN AMERICAN: 44 mL/min — AB (ref >60)
Glucose, Bld: 279 mg/dL — ABNORMAL HIGH (ref 65–99)
Potassium: 4.5 mmol/L (ref 3.5–5.1)
SODIUM: 137 mmol/L (ref 135–145)

## 2015-11-21 LAB — CBC WITH DIFFERENTIAL/PLATELET
BASOS ABS: 0 10*3/uL (ref 0.0–0.1)
BASOS PCT: 0 %
EOS ABS: 0 10*3/uL (ref 0.0–0.7)
EOS PCT: 0 %
HCT: 28.6 % — ABNORMAL LOW (ref 39.0–52.0)
Hemoglobin: 9.6 g/dL — ABNORMAL LOW (ref 13.0–17.0)
Lymphocytes Relative: 8 %
Lymphs Abs: 0.9 10*3/uL (ref 0.7–4.0)
MCH: 31.3 pg (ref 26.0–34.0)
MCHC: 33.6 g/dL (ref 30.0–36.0)
MCV: 93.2 fL (ref 78.0–100.0)
MONO ABS: 0.6 10*3/uL (ref 0.1–1.0)
Monocytes Relative: 5 %
NEUTROS ABS: 9.8 10*3/uL — AB (ref 1.7–7.7)
Neutrophils Relative %: 87 %
PLATELETS: 176 10*3/uL (ref 150–400)
RBC: 3.07 MIL/uL — ABNORMAL LOW (ref 4.22–5.81)
RDW: 14.4 % (ref 11.5–15.5)
WBC: 11.2 10*3/uL — ABNORMAL HIGH (ref 4.0–10.5)

## 2015-11-21 MED ORDER — METHOCARBAMOL 500 MG PO TABS: 500.0000 mg | ORAL_TABLET | Freq: Two times a day (BID) | ORAL | Status: AC

## 2015-11-21 MED ORDER — SODIUM CHLORIDE 0.9 % IV BOLUS (SEPSIS)
1000.0000 mL | Freq: Once | INTRAVENOUS | Status: AC
  Administered 2015-11-21: 1000 mL via INTRAVENOUS

## 2015-11-21 MED ORDER — KETOROLAC TROMETHAMINE 60 MG/2ML IM SOLN
60.0000 mg | Freq: Once | INTRAMUSCULAR | Status: AC
  Administered 2015-11-21: 60 mg via INTRAMUSCULAR
  Filled 2015-11-21: qty 2

## 2015-11-21 MED ORDER — OXYCODONE-ACETAMINOPHEN 5-325 MG PO TABS: 1.0000 | ORAL_TABLET | ORAL | Status: AC | PRN

## 2015-11-21 MED ORDER — LIDOCAINE 5 % EX PTCH: 1.0000 | MEDICATED_PATCH | CUTANEOUS | Status: AC

## 2015-11-21 MED ORDER — FENTANYL CITRATE (PF) 100 MCG/2ML IJ SOLN: 100.0000 ug | Freq: Once | INTRAMUSCULAR | Status: DC

## 2015-11-21 MED ORDER — IOPAMIDOL (ISOVUE-300) INJECTION 61%
80.0000 mL | Freq: Once | INTRAVENOUS | Status: AC | PRN
  Administered 2015-11-21: 80 mL via INTRAVENOUS

## 2015-11-21 MED ORDER — FENTANYL CITRATE (PF) 100 MCG/2ML IJ SOLN
50.0000 ug | Freq: Once | INTRAMUSCULAR | Status: AC
  Administered 2015-11-21: 50 ug via INTRAVENOUS
  Filled 2015-11-21: qty 2

## 2015-11-21 NOTE — ED Notes (Signed)
Patient is having pain to his left chest and rib region post fall. The patient reports that he also has pain to his left arm. Bruising noted to multiple areas on the left side .

## 2015-11-21 NOTE — Discharge Instructions (Signed)

## 2015-11-21 NOTE — ED Provider Notes (Signed)
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 hydronephrosis, or focal enhancing renal mass. No evidence for acute renal injury. Subcentimeter hypodensity at the upper pole of the right kidney noted, too small the characterize, but statistically likely a small cyst. Small hiatal hernia noted. Stomach otherwise unremarkable. No evidence for bowel obstruction. No abnormal wall thickening, mucosal enhancement, or inflammatory fat stranding seen about the bowels. No evidence for acute bowel injury. Bladder within normal limits. Prostate grossly normal, but limited evaluation due to streak artifact from right hip arthroplasty. No free air. There is a persistent somewhat thick rimmed collection within the right pericolic gutter that measures approximately 3.1 x 3.5 x 3.2 cm (series 3, image 63). This extends cephalad to the posterior surface of the right hepatic lobe. Rim enhancement about this collection with small amount of adjacent soft tissue stranding. This likely reflects a small residual collection from previously seen large collection at this location on prior CT from 03/23/2015. No mesenteric or retroperitoneal hematoma. No adenopathy. Advanced atheromatous disease throughout the intra-abdominal aorta and its branch  vessels. No evidence for traumatic vascular injury. External soft tissues within normal limits. No acute osseous abnormality within the abdomen and pelvis. Grade 1 anterolisthesis of L4 on L5, stable. Height loss at the T9 vertebral body is stable. Multilevel degenerative changes within the visualized spine. Right hip arthroplasty in place. IMPRESSION: 1. Acute nondisplaced fractures of the left lateral fifth, sixth, and seventh ribs. Mild swelling/contusion within the overlying musculature of the left chest wall. 2. Small left pleural effusion with associated atelectasis. No pneumothorax. 3. No other acute traumatic injury within the chest, abdomen, and pelvis. 4. 3.1 x 3.5 x 3.2 cm collection with thick enhancing rim extending from the posterior aspect of the right hepatic lobe into the right pericolic gutter. This likely reflects residual of a large collection seen in this region on prior CT from 03/12/2015. Superimposed infection is not excluded. 5. Advanced coronary artery calcifications and atherosclerotic disease throughout the intra-abdominal aorta and its branch vessels. 6. Remotely healed fractures of the right  anterolateral fourth and fifth ribs. 7. Right total hip arthroplasty. Electronically Signed   By: Rise Mu M.D.   On: 11/21/2015 04:22   Ct Cervical Spine Wo Contrast  11/21/2015  CLINICAL DATA:  Fall with left-sided bruising.  Initial encounter. EXAM: CT HEAD WITHOUT CONTRAST CT CERVICAL SPINE WITHOUT CONTRAST TECHNIQUE: Multidetector CT imaging of the head and cervical spine was performed following the standard protocol without intravenous contrast. Multiplanar CT image reconstructions of the cervical spine were also generated. COMPARISON:  None. FINDINGS: CT HEAD FINDINGS Skull and Sinuses:Negative for fracture or hemo sinus. Visualized orbits: Negative. Brain: Unremarkable. No evidence of acute infarction, hemorrhage, hydrocephalus, or mass lesion/mass effect. CT CERVICAL SPINE  FINDINGS Negative for acute fracture or traumatic malalignment. No prevertebral edema. No gross cervical canal hematoma. Disc degeneration that is focally advanced at C5-6 and C6-7. Multilevel facet arthropathy with mild C4-5 anterolisthesis. Bilateral sub cm thyroid nodules, usually incidental at this size. IMPRESSION: No evidence of acute intracranial or cervical spine injury. Electronically Signed   By: Marnee Spring M.D.   On: 11/21/2015 03:49   Ct Abdomen Pelvis W Contrast  11/21/2015  CLINICAL DATA:  Initial evaluation for acute trauma, fall. Left-sided chest and rib pain. Left arm pain. EXAM: CT CHEST, ABDOMEN, AND PELVIS WITH CONTRAST TECHNIQUE: Multidetector CT imaging of the chest, abdomen and pelvis was performed following the standard protocol during bolus administration of intravenous contrast. CONTRAST:  80mL ISOVUE-300 IOPAMIDOL (ISOVUE-300) INJECTION 61% COMPARISON:  Prior radiograph from 11/20/2015. Comparison also may prior study from 10/5/. FINDINGS: CT CHEST Multiple small hypodense nodules noted within the partially visualized thyroid, of doubtful clinical significance. No pathologically enlarged mediastinal, hilar, or axillary lymph nodes identified. Intrathoracic aorta of normal caliber without evidence for acute traumatic injury. Great vessels intact. No mediastinal hematoma. Scattered atheromatous plaque within the intrathoracic aorta in at the origin of the great vessels. No high-grade flow-limiting stenosis. Mild cardiomegaly. Extensive 3 vessel coronary artery calcifications. No pericardial effusion. Limited evaluation of the pulmonary arteries grossly unremarkable. No pneumothorax. Small layering left pleural effusion with associated atelectasis. Deep tendon atelectasis within the right lung. No pulmonary contusion or focal infiltrate. No pulmonary edema. No worrisome pulmonary nodule or mass. Acute fractures of the left lateral fifth, sixth, and seventh ribs. Irregularity at the  left costovertebral junctions likely related to remote trauma and/ or chronic degenerative changes. Remotely healed fracture of the right anterior fourth and fifth ribs. No acute right-sided rib fractures. Degenerative changes noted about the partially visualized right shoulder. Mild soft tissue contusion within the musculature of the left chest wall overlying the left-sided rib fractures. External soft tissues without additional acute abnormality. CT ABDOMEN AND PELVIS Liver demonstrates a normal contrast enhanced appearance without evidence for acute traumatic injury. Gallbladder surgically absent. Prominence of the common bile duct likely related to post cholecystectomy changes. Spleen intact. No perisplenic hematoma. Adrenal glands and pancreas within normal limits. Kidneys equal in size with symmetric enhancement. No nephrolithiasis, hydronephrosis, or focal enhancing renal mass. No evidence for acute renal injury. Subcentimeter hypodensity at the upper pole of the right kidney noted, too small the characterize, but statistically likely a small cyst. Small hiatal hernia noted. Stomach otherwise unremarkable. No evidence for bowel obstruction. No abnormal wall thickening, mucosal enhancement, or inflammatory fat stranding seen about the bowels. No evidence for acute bowel injury. Bladder within normal limits. Prostate grossly normal, but limited evaluation due to streak artifact from right hip arthroplasty. No free air. There is a persistent somewhat thick  rimmed collection within the right pericolic gutter that measures approximately 3.1 x 3.5 x 3.2 cm (series 3, image 63). This extends cephalad to the posterior surface of the right hepatic lobe. Rim enhancement about this collection with small amount of adjacent soft tissue stranding. This likely reflects a small residual collection from previously seen large collection at this location on prior CT from 03/23/2015. No mesenteric or retroperitoneal hematoma. No  adenopathy. Advanced atheromatous disease throughout the intra-abdominal aorta and its branch vessels. No evidence for traumatic vascular injury. External soft tissues within normal limits. No acute osseous abnormality within the abdomen and pelvis. Grade 1 anterolisthesis of L4 on L5, stable. Height loss at the T9 vertebral body is stable. Multilevel degenerative changes within the visualized spine. Right hip arthroplasty in place. IMPRESSION: 1. Acute nondisplaced fractures of the left lateral fifth, sixth, and seventh ribs. Mild swelling/contusion within the overlying musculature of the left chest wall. 2. Small left pleural effusion with associated atelectasis. No pneumothorax. 3. No other acute traumatic injury within the chest, abdomen, and pelvis. 4. 3.1 x 3.5 x 3.2 cm collection with thick enhancing rim extending from the posterior aspect of the right hepatic lobe into the right pericolic gutter. This likely reflects residual of a large collection seen in this region on prior CT from 03/12/2015. Superimposed infection is not excluded. 5. Advanced coronary artery calcifications and atherosclerotic disease throughout the intra-abdominal aorta and its branch vessels. 6. Remotely healed fractures of the right anterolateral fourth and fifth ribs. 7. Right total hip arthroplasty. Electronically Signed   By: Rise Mu M.D.   On: 11/21/2015 04:22    Medications  fentaNYL (SUBLIMAZE) injection 100 mcg (100 mcg Intravenous Not Given 11/21/15 0521)  ketorolac (TORADOL) injection 60 mg (60 mg Intramuscular Given 11/21/15 0110)  fentaNYL (SUBLIMAZE) injection 50 mcg (50 mcg Intravenous Given 11/21/15 0157)  sodium chloride 0.9 % bolus 1,000 mL (0 mLs Intravenous Stopped 11/21/15 0344)  iopamidol (ISOVUE-300) 61 % injection 80 mL (80 mLs Intravenous Contrast Given 11/21/15 0340)    Case d/w Dr. Lindie Spruce, pain management and discharge.   Abdominal collection can be worked up as an outpatient without abx at this  time.     Patient is currently resting comfortably and all CT results discussed with patient and need to use incentive spirometer as directed take medication for pain and to follow up with general surgery.  No heavy lifting.  Patient and wife verbalize understanding and agree to follow up   Donterius Filley, MD 11/21/15 [id number]

## 2016-06-11 ENCOUNTER — Emergency Department (HOSPITAL_BASED_OUTPATIENT_CLINIC_OR_DEPARTMENT_OTHER)
Admission: EM | Admit: 2016-06-11 | Discharge: 2016-06-11 | Disposition: A | Discharge status: Home / Self Care | Payer: Medicare Other | Attending: Emergency Medicine | Admitting: Emergency Medicine

## 2016-06-11 ENCOUNTER — Encounter (HOSPITAL_BASED_OUTPATIENT_CLINIC_OR_DEPARTMENT_OTHER): Payer: Self-pay

## 2016-06-11 ENCOUNTER — Emergency Department (HOSPITAL_BASED_OUTPATIENT_CLINIC_OR_DEPARTMENT_OTHER): Payer: Medicare Other

## 2016-06-11 DIAGNOSIS — Y929 Unspecified place or not applicable: Secondary | ICD-10-CM | POA: Insufficient documentation

## 2016-06-11 DIAGNOSIS — L03116 Cellulitis of left lower limb: Secondary | ICD-10-CM | POA: Diagnosis not present

## 2016-06-11 DIAGNOSIS — Y939 Activity, unspecified: Secondary | ICD-10-CM | POA: Diagnosis not present

## 2016-06-11 DIAGNOSIS — N183 Chronic kidney disease, stage 3 (moderate): Secondary | ICD-10-CM | POA: Diagnosis not present

## 2016-06-11 DIAGNOSIS — W450XXA Nail entering through skin, initial encounter: Secondary | ICD-10-CM | POA: Insufficient documentation

## 2016-06-11 DIAGNOSIS — E1122 Type 2 diabetes mellitus with diabetic chronic kidney disease: Secondary | ICD-10-CM | POA: Insufficient documentation

## 2016-06-11 DIAGNOSIS — Z23 Encounter for immunization: Secondary | ICD-10-CM | POA: Insufficient documentation

## 2016-06-11 DIAGNOSIS — Z79899 Other long term (current) drug therapy: Secondary | ICD-10-CM | POA: Insufficient documentation

## 2016-06-11 DIAGNOSIS — I2510 Atherosclerotic heart disease of native coronary artery without angina pectoris: Secondary | ICD-10-CM | POA: Insufficient documentation

## 2016-06-11 DIAGNOSIS — Z791 Long term (current) use of non-steroidal anti-inflammatories (NSAID): Secondary | ICD-10-CM | POA: Insufficient documentation

## 2016-06-11 DIAGNOSIS — S91135A Puncture wound without foreign body of left lesser toe(s) without damage to nail, initial encounter: Secondary | ICD-10-CM | POA: Diagnosis not present

## 2016-06-11 DIAGNOSIS — Z7984 Long term (current) use of oral hypoglycemic drugs: Secondary | ICD-10-CM | POA: Insufficient documentation

## 2016-06-11 DIAGNOSIS — S99922A Unspecified injury of left foot, initial encounter: Secondary | ICD-10-CM | POA: Diagnosis present

## 2016-06-11 DIAGNOSIS — Y999 Unspecified external cause status: Secondary | ICD-10-CM | POA: Insufficient documentation

## 2016-06-11 DIAGNOSIS — I129 Hypertensive chronic kidney disease with stage 1 through stage 4 chronic kidney disease, or unspecified chronic kidney disease: Secondary | ICD-10-CM | POA: Diagnosis not present

## 2016-06-11 DIAGNOSIS — L03032 Cellulitis of left toe: Secondary | ICD-10-CM

## 2016-06-11 LAB — CBC WITH DIFFERENTIAL/PLATELET
BASOS ABS: 0 10*3/uL (ref 0.0–0.1)
Basophils Relative: 0 %
Eosinophils Absolute: 0.2 10*3/uL (ref 0.0–0.7)
Eosinophils Relative: 2 %
HEMATOCRIT: 29.9 % — AB (ref 39.0–52.0)
Hemoglobin: 9.7 g/dL — ABNORMAL LOW (ref 13.0–17.0)
LYMPHS PCT: 14 %
Lymphs Abs: 1.2 10*3/uL (ref 0.7–4.0)
MCH: 30.3 pg (ref 26.0–34.0)
MCHC: 32.4 g/dL (ref 30.0–36.0)
MCV: 93.4 fL (ref 78.0–100.0)
MONO ABS: 0.8 10*3/uL (ref 0.1–1.0)
Monocytes Relative: 10 %
NEUTROS ABS: 6 10*3/uL (ref 1.7–7.7)
Neutrophils Relative %: 74 %
Platelets: 206 10*3/uL (ref 150–400)
RBC: 3.2 MIL/uL — ABNORMAL LOW (ref 4.22–5.81)
RDW: 15.4 % (ref 11.5–15.5)
WBC: 8.1 10*3/uL (ref 4.0–10.5)

## 2016-06-11 LAB — BASIC METABOLIC PANEL
ANION GAP: 6 (ref 5–15)
BUN: 24 mg/dL — ABNORMAL HIGH (ref 6–20)
CO2: 24 mmol/L (ref 22–32)
Calcium: 8.5 mg/dL — ABNORMAL LOW (ref 8.9–10.3)
Chloride: 107 mmol/L (ref 101–111)
Creatinine, Ser: 1.62 mg/dL — ABNORMAL HIGH (ref 0.61–1.24)
GFR calc Af Amer: 48 mL/min — ABNORMAL LOW (ref >60)
GFR calc non Af Amer: 42 mL/min — ABNORMAL LOW (ref >60)
GLUCOSE: 247 mg/dL — AB (ref 65–99)
POTASSIUM: 5.3 mmol/L — AB (ref 3.5–5.1)
Sodium: 137 mmol/L (ref 135–145)

## 2016-06-11 MED ORDER — PIPERACILLIN-TAZOBACTAM 3.375 G IVPB 30 MIN
3.3750 g | Freq: Once | INTRAVENOUS | Status: AC
  Administered 2016-06-11: 3.375 g via INTRAVENOUS
  Filled 2016-06-11 (×2): qty 50

## 2016-06-11 MED ORDER — DOXYCYCLINE HYCLATE 100 MG PO CAPS: 100.0000 mg | ORAL_CAPSULE | Freq: Two times a day (BID) | ORAL | 0 refills | Status: AC

## 2016-06-11 MED ORDER — CIPROFLOXACIN HCL 500 MG PO TABS: 500.0000 mg | ORAL_TABLET | Freq: Two times a day (BID) | ORAL | 0 refills | Status: AC

## 2016-06-11 MED ORDER — TETANUS-DIPHTH-ACELL PERTUSSIS 5-2.5-18.5 LF-MCG/0.5 IM SUSP
0.5000 mL | Freq: Once | INTRAMUSCULAR | Status: AC
  Administered 2016-06-11: 0.5 mL via INTRAMUSCULAR
  Filled 2016-06-11: qty 0.5

## 2016-06-11 MED ORDER — VANCOMYCIN HCL IN DEXTROSE 1-5 GM/200ML-% IV SOLN
1000.0000 mg | Freq: Once | INTRAVENOUS | Status: AC
  Administered 2016-06-11: 1000 mg via INTRAVENOUS
  Filled 2016-06-11: qty 200
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
## 2016-06-11 NOTE — Discharge Instructions (Signed)
You were seen today for an infection of your left second toe. You are at high risk for bad infection given your diabetes and delayed presentation. He did not want to come into the hospital. He will be sent home on antibiotics. If he has increasing redness, drainage, developed fevers or any new or worsening symptoms he need to be reevaluated immediately. See your primary physician tomorrow for recheck.

## 2016-06-11 NOTE — ED Triage Notes (Signed)
Reports stepping on a nail with left foot on Friday and has been trying to clean it but now it is painful swollen and red.  Reports unknown tetanus

## 2016-06-16 LAB — CULTURE, BLOOD (ROUTINE X 2)
CULTURE: NO GROWTH
CULTURE: NO GROWTH

## 2019-12-17 DEATH — deceased
